# Patient Record
Sex: Female | Born: 1955
Health system: Southern US, Community
[De-identification: ages and names within clinical notes are randomized; demographics above are authoritative.]

## PROBLEM LIST (undated history)

## (undated) DIAGNOSIS — E669 Obesity, unspecified: Secondary | ICD-10-CM

## (undated) DIAGNOSIS — E785 Hyperlipidemia, unspecified: Secondary | ICD-10-CM

## (undated) DIAGNOSIS — T7840XA Allergy, unspecified, initial encounter: Secondary | ICD-10-CM

## (undated) DIAGNOSIS — F419 Anxiety disorder, unspecified: Secondary | ICD-10-CM

## (undated) DIAGNOSIS — S52612A Displaced fracture of left ulna styloid process, initial encounter for closed fracture: Secondary | ICD-10-CM

## (undated) DIAGNOSIS — K529 Noninfective gastroenteritis and colitis, unspecified: Secondary | ICD-10-CM

## (undated) DIAGNOSIS — B001 Herpesviral vesicular dermatitis: Secondary | ICD-10-CM

## (undated) DIAGNOSIS — J302 Other seasonal allergic rhinitis: Secondary | ICD-10-CM

## (undated) DIAGNOSIS — F32A Depression, unspecified: Secondary | ICD-10-CM

## (undated) DIAGNOSIS — R55 Syncope and collapse: Secondary | ICD-10-CM

## (undated) DIAGNOSIS — M1990 Unspecified osteoarthritis, unspecified site: Secondary | ICD-10-CM

## (undated) DIAGNOSIS — G8929 Other chronic pain: Secondary | ICD-10-CM

## (undated) DIAGNOSIS — K219 Gastro-esophageal reflux disease without esophagitis: Secondary | ICD-10-CM

## (undated) DIAGNOSIS — M8580 Other specified disorders of bone density and structure, unspecified site: Secondary | ICD-10-CM

## (undated) DIAGNOSIS — F329 Major depressive disorder, single episode, unspecified: Secondary | ICD-10-CM

## (undated) DIAGNOSIS — K56699 Other intestinal obstruction unspecified as to partial versus complete obstruction: Secondary | ICD-10-CM

## (undated) DIAGNOSIS — K551 Chronic vascular disorders of intestine: Secondary | ICD-10-CM

## (undated) HISTORY — DX: Depression, unspecified: F32.A

## (undated) HISTORY — DX: Allergy, unspecified, initial encounter: T78.40XA

## (undated) HISTORY — DX: Other intestinal obstruction unspecified as to partial versus complete obstruction: K56.699

## (undated) HISTORY — DX: Hyperlipidemia, unspecified: E78.5

## (undated) HISTORY — DX: Displaced fracture of left ulna styloid process, initial encounter for closed fracture: S52.612A

## (undated) HISTORY — DX: Other specified disorders of bone density and structure, unspecified site: M85.80

## (undated) HISTORY — DX: Unspecified osteoarthritis, unspecified site: M19.90

## (undated) HISTORY — DX: Major depressive disorder, single episode, unspecified: F32.9

## (undated) HISTORY — DX: Other seasonal allergic rhinitis: J30.2

## (undated) HISTORY — DX: Gastro-esophageal reflux disease without esophagitis: K21.9

## (undated) HISTORY — DX: Other chronic pain: G89.29

## (undated) HISTORY — DX: Herpesviral vesicular dermatitis: B00.1

## (undated) HISTORY — PX: COLON SURGERY: SHX602

## (undated) HISTORY — PX: TONSILLECTOMY: SUR1361

## (undated) HISTORY — DX: Anxiety disorder, unspecified: F41.9

## (undated) HISTORY — PX: JOINT REPLACEMENT: SHX530

## (undated) HISTORY — DX: Syncope and collapse: R55

## (undated) HISTORY — DX: Chronic vascular disorders of intestine: K55.1

## (undated) HISTORY — DX: Obesity, unspecified: E66.9

## (undated) HISTORY — PX: FRACTURE SURGERY: SHX138

---

## 1985-03-10 HISTORY — PX: DILATION AND CURETTAGE, DIAGNOSTIC / THERAPEUTIC: SUR384

## 2008-02-19 ENCOUNTER — Emergency Department (HOSPITAL_BASED_OUTPATIENT_CLINIC_OR_DEPARTMENT_OTHER): Admission: EM | Admit: 2008-02-19 | Discharge: 2008-02-19 | Payer: Self-pay | Admitting: Emergency Medicine

## 2009-09-11 ENCOUNTER — Observation Stay (HOSPITAL_COMMUNITY): Admission: EM | Admit: 2009-09-11 | Discharge: 2009-09-12 | Payer: Self-pay | Admitting: Internal Medicine

## 2009-09-11 ENCOUNTER — Encounter: Payer: Self-pay | Admitting: Emergency Medicine

## 2009-09-11 ENCOUNTER — Ambulatory Visit: Payer: Self-pay | Admitting: Diagnostic Radiology

## 2010-01-08 LAB — HM MAMMOGRAPHY

## 2010-05-26 LAB — BASIC METABOLIC PANEL
BUN: 20 mg/dL (ref 6–23)
BUN: 9 mg/dL (ref 6–23)
CO2: 27 mEq/L (ref 19–32)
Calcium: 8.5 mg/dL (ref 8.4–10.5)
Chloride: 102 mEq/L (ref 96–112)
Creatinine, Ser: 0.65 mg/dL (ref 0.4–1.2)
Creatinine, Ser: 0.9 mg/dL (ref 0.4–1.2)
GFR calc Af Amer: >60 mL/min (ref >60)
Glucose, Bld: 121 mg/dL — ABNORMAL HIGH (ref 70–99)
Sodium: 143 mEq/L (ref 135–145)

## 2010-05-26 LAB — CBC
HCT: 36.3 % (ref 36.0–46.0)
Hemoglobin: 12.6 g/dL (ref 12.0–15.0)
MCH: 30 pg (ref 26.0–34.0)
MCH: 30.2 pg (ref 26.0–34.0)
MCHC: 34.7 g/dL (ref 30.0–36.0)
MCV: 86.5 fL (ref 78.0–100.0)
MCV: 87.1 fL (ref 78.0–100.0)
Platelets: 155 10*3/uL (ref 150–400)
Platelets: 170 10*3/uL (ref 150–400)
RBC: 3.97 MIL/uL (ref 3.87–5.11)
RDW: 13 % (ref 11.5–15.5)
RDW: 13.7 % (ref 11.5–15.5)

## 2010-05-26 LAB — URINALYSIS, ROUTINE W REFLEX MICROSCOPIC
Bilirubin Urine: NEGATIVE
Ketones, ur: NEGATIVE mg/dL
Nitrite: NEGATIVE
Protein, ur: NEGATIVE mg/dL
Specific Gravity, Urine: 1.019 (ref 1.005–1.030)
Urobilinogen, UA: 1 mg/dL (ref 0.0–1.0)
pH: 5 (ref 5.0–8.0)

## 2010-05-26 LAB — DIFFERENTIAL
Basophils Relative: 0 % (ref 0–1)
Eosinophils Absolute: 0 10*3/uL (ref 0.0–0.7)
Lymphocytes Relative: 18 % (ref 12–46)
Lymphs Abs: 1.1 10*3/uL (ref 0.7–4.0)
Monocytes Absolute: 0.5 10*3/uL (ref 0.1–1.0)

## 2010-05-26 LAB — CARDIAC PANEL(CRET KIN+CKTOT+MB+TROPI)
CK, MB: 1.4 ng/mL (ref 0.3–4.0)
Total CK: 81 U/L (ref 7–177)

## 2010-05-26 LAB — HEMOGLOBIN A1C
Hgb A1c MFr Bld: 5.7 % — ABNORMAL HIGH (ref <5.7)
Mean Plasma Glucose: 117 mg/dL — ABNORMAL HIGH (ref <117)

## 2010-05-26 LAB — LACTIC ACID, PLASMA: Lactic Acid, Venous: 1.9 mmol/L (ref 0.5–2.2)

## 2010-09-23 ENCOUNTER — Encounter: Payer: Self-pay | Admitting: Family Medicine

## 2010-09-23 DIAGNOSIS — M1990 Unspecified osteoarthritis, unspecified site: Secondary | ICD-10-CM | POA: Insufficient documentation

## 2010-09-23 DIAGNOSIS — F32A Depression, unspecified: Secondary | ICD-10-CM | POA: Insufficient documentation

## 2010-09-23 DIAGNOSIS — E785 Hyperlipidemia, unspecified: Secondary | ICD-10-CM | POA: Insufficient documentation

## 2010-09-23 DIAGNOSIS — F329 Major depressive disorder, single episode, unspecified: Secondary | ICD-10-CM | POA: Insufficient documentation

## 2011-01-04 ENCOUNTER — Emergency Department (HOSPITAL_COMMUNITY): Payer: 59

## 2011-01-04 ENCOUNTER — Emergency Department (HOSPITAL_COMMUNITY)
Admission: EM | Admit: 2011-01-04 | Discharge: 2011-01-04 | Disposition: A | Discharge status: Home / Self Care | Payer: 59 | Attending: Emergency Medicine | Admitting: Emergency Medicine

## 2011-01-04 DIAGNOSIS — R1013 Epigastric pain: Secondary | ICD-10-CM | POA: Insufficient documentation

## 2011-01-04 DIAGNOSIS — R1032 Left lower quadrant pain: Secondary | ICD-10-CM | POA: Insufficient documentation

## 2011-01-04 DIAGNOSIS — M129 Arthropathy, unspecified: Secondary | ICD-10-CM | POA: Insufficient documentation

## 2011-01-04 DIAGNOSIS — E7800 Pure hypercholesterolemia, unspecified: Secondary | ICD-10-CM | POA: Insufficient documentation

## 2011-01-04 DIAGNOSIS — Z79899 Other long term (current) drug therapy: Secondary | ICD-10-CM | POA: Insufficient documentation

## 2011-01-04 LAB — COMPREHENSIVE METABOLIC PANEL
CO2: 28 mEq/L (ref 19–32)
Calcium: 10.1 mg/dL (ref 8.4–10.5)
Creatinine, Ser: 0.86 mg/dL (ref 0.50–1.10)
GFR calc Af Amer: 85 mL/min — ABNORMAL LOW (ref >90)
GFR calc non Af Amer: 74 mL/min — ABNORMAL LOW (ref >90)
Glucose, Bld: 155 mg/dL — ABNORMAL HIGH (ref 70–99)
Total Protein: 6.4 g/dL (ref 6.0–8.3)

## 2011-01-04 LAB — URINALYSIS, ROUTINE W REFLEX MICROSCOPIC
Glucose, UA: NEGATIVE mg/dL
Hgb urine dipstick: NEGATIVE
Protein, ur: NEGATIVE mg/dL
Urobilinogen, UA: 0.2 mg/dL (ref 0.0–1.0)

## 2011-01-04 LAB — DIFFERENTIAL
Basophils Absolute: 0 10*3/uL (ref 0.0–0.1)
Basophils Relative: 0 % (ref 0–1)
Eosinophils Relative: 0 % (ref 0–5)
Monocytes Absolute: 0.5 10*3/uL (ref 0.1–1.0)
Monocytes Relative: 8 % (ref 3–12)
Neutro Abs: 4.4 10*3/uL (ref 1.7–7.7)

## 2011-01-04 LAB — CBC
HCT: 35.4 % — ABNORMAL LOW (ref 36.0–46.0)
Hemoglobin: 11.9 g/dL — ABNORMAL LOW (ref 12.0–15.0)
MCH: 29.4 pg (ref 26.0–34.0)
MCHC: 33.6 g/dL (ref 30.0–36.0)
RDW: 13.1 % (ref 11.5–15.5)

## 2011-01-04 LAB — POCT I-STAT, CHEM 8
BUN: 5 mg/dL — ABNORMAL LOW (ref 6–23)
Creatinine, Ser: 0.8 mg/dL (ref 0.50–1.10)
Glucose, Bld: 127 mg/dL — ABNORMAL HIGH (ref 70–99)
Hemoglobin: 12.6 g/dL (ref 12.0–15.0)
TCO2: 22 mmol/L (ref 0–100)

## 2011-01-04 LAB — LIPASE, BLOOD: Lipase: 40 U/L (ref 11–59)

## 2011-01-04 MED ORDER — IOHEXOL 300 MG/ML  SOLN
100.0000 mL | Freq: Once | INTRAMUSCULAR | Status: AC | PRN
  Administered 2011-01-04: 100 mL via INTRAVENOUS

## 2011-11-18 ENCOUNTER — Ambulatory Visit (INDEPENDENT_AMBULATORY_CARE_PROVIDER_SITE_OTHER): Payer: 59 | Admitting: Family Medicine

## 2011-11-18 ENCOUNTER — Encounter: Payer: Self-pay | Admitting: Family Medicine

## 2011-11-18 ENCOUNTER — Ambulatory Visit (HOSPITAL_BASED_OUTPATIENT_CLINIC_OR_DEPARTMENT_OTHER)
Admission: RE | Admit: 2011-11-18 | Discharge: 2011-11-18 | Disposition: A | Discharge status: Home / Self Care | Payer: 59 | Source: Ambulatory Visit | Attending: Family Medicine | Admitting: Family Medicine

## 2011-11-18 VITALS — BP 109/75 | HR 80 | Ht 70.0 in | Wt 209.0 lb

## 2011-11-18 DIAGNOSIS — M25569 Pain in unspecified knee: Secondary | ICD-10-CM | POA: Insufficient documentation

## 2011-11-18 DIAGNOSIS — M1710 Unilateral primary osteoarthritis, unspecified knee: Secondary | ICD-10-CM | POA: Insufficient documentation

## 2011-11-18 DIAGNOSIS — M25561 Pain in right knee: Secondary | ICD-10-CM

## 2011-11-18 NOTE — Patient Instructions (Addendum)
Take tylenol 500mg  1-2 tabs three times a day for pain. Aleve 1-2 tabs twice a day with food Glucosamine sulfate 750mg  twice a day is a supplement that may help moderate to severe arthritis. Capsaicin topically up to four times a day may also help with pain. Cortisone injections are an option - you were given one of these today. If cortisone injections do not help, there are different types of shots that may help but they take longer to take effect. It's important that you continue to stay active. Consider physical therapy to strengthen muscles around the joint that hurts to take pressure off of the joint itself. Straight leg raises, leg raises with outturned foot, leg extension, hip side raises - 3 sets of 10 once a day. Shoe inserts with good arch support may be helpful. Walker or cane if needed. Heat or ice 15 minutes at a time 3-4 times a day as needed to help with pain. Water aerobics and cycling with low resistance are the best two types of exercise for arthritis. A knee brace may be helpful if your knee feels unstable due to pain. Follow up with me in 1 month or as needed.

## 2011-11-23 ENCOUNTER — Encounter: Payer: Self-pay | Admitting: Family Medicine

## 2011-11-23 DIAGNOSIS — M25561 Pain in right knee: Secondary | ICD-10-CM | POA: Insufficient documentation

## 2011-11-23 NOTE — Assessment & Plan Note (Signed)
2/2 moderate medial DJD.  Discussed conservative care (tylenol, mobic (instead of aleve as she takes mobic regularly), glucosamine, capsaicin.  She would like to try cortisone injection.  Dicussed viscosupplementation, formal PT.  Quad strengthening demonstrated.  F/u in 1 month or prn.  After informed written consent, patient was seated on exam table. Right knee was prepped with alcohol swab and utilizing anterolateral approach, patient's right knee was injected intraarticularly with 3:1 marcaine: depomedrol. Patient tolerated the procedure well without immediate complications.

## 2011-11-23 NOTE — Progress Notes (Signed)
Subjective:    [name], female    DOB: 04/09/1955, 56 y.o.   MRN: [id number]  PCP: Dr. Tanya Nones  HPI 56 yo F here for right knee pain.  Patient reports about 10 years ago she had lateral aspect of right knee taken out by her dog. This led to swelling, crutches for a week and felt completely better after 1-2 weeks. Had some pain 5-6 years ago - did physical therapy which helped. Has lost over 100 pounds past year and more active. Pain has become more persistent past 1-2 years. More medial. No catching, locking though does feel like it's going to give out on occasion. Takes mobic and ices after exercise. Some swelling but no bruising.  Past Medical History  Diagnosis Date  . Seasonal allergies   . Osteoarthritis   . Depression   . Hyperlipidemia     Current Outpatient Prescriptions on File Prior to Visit  Medication Sig Dispense Refill  . atorvastatin (LIPITOR) 40 MG tablet Take 40 mg by mouth daily.        . calcium-vitamin D (OSCAL WITH D) 500-200 MG-UNIT per tablet Take 1 tablet by mouth daily.        . Cholecalciferol (VITAMIN D) 1000 UNITS capsule Take 1,000 Units by mouth daily.        . citalopram (CELEXA) 20 MG tablet Take 20 mg by mouth daily.        Stephanie Acre (GLUCOSAMINE MSM COMPLEX) TABS Take by mouth.        . loratadine (CLARITIN) 10 MG tablet Take 10 mg by mouth daily.        . meloxicam (MOBIC) 7.5 MG tablet Take 7.5 mg by mouth daily.        . Multiple Vitamins-Minerals (MULTIVITAMIN,TX-MINERALS) tablet Take 1 tablet by mouth daily.        . vitamin E 400 UNIT capsule Take 400 Units by mouth daily.          Past Surgical History  Procedure Date  . Tonsillectomy     Allergies  Allergen Reactions  . Penicillins Rash  . Sulfa Antibiotics Rash    History   Social History  . Marital Status: Divorced    Spouse Name: N/A    Number of Children: N/A  . Years of Education: N/A   Occupational History  . Not on file.     Social History Main Topics  . Smoking status: Never Smoker   . Smokeless tobacco: Not on file  . Alcohol Use: Not on file  . Drug Use: Not on file  . Sexually Active: Not on file   Other Topics Concern  . Not on file   Social History Narrative  . No narrative on file    Family History  Problem Relation Age of Onset  . Sudden death Neg Hx   . Hypertension Neg Hx   . Hyperlipidemia Neg Hx   . Heart attack Neg Hx   . Diabetes Neg Hx     BP 109/75  Pulse 80  Ht 5\' 10"  (1.778 m)  Wt 209 lb (94.802 kg)  BMI 29.99 kg/m2  Review of Systems See HPI above.    Objective:   Physical Exam Gen: NAD  R knee: No gross deformity, ecchymoses, swelling.  1+ crepitation. TTP medial joint line.  No lateral joint line or post patellar facet TTP. FROM. Negative ant/post drawers. Negative valgus/varus testing. Negative lachmanns. Negative mcmurrays, apleys, patellar apprehension, clarkes. NV intact distally.  L  knee: FROM without pain or instability.    Assessment & Plan:  1. RIght knee pain - 2/2 moderate medial DJD.  Discussed conservative care (tylenol, mobic (instead of aleve as she takes mobic regularly), glucosamine, capsaicin.  She would like to try cortisone injection.  Dicussed viscosupplementation, formal PT.  Quad strengthening demonstrated.  F/u in 1 month or prn.  After informed written consent, patient was seated on exam table. Right knee was prepped with alcohol swab and utilizing anterolateral approach, patient's right knee was injected intraarticularly with 3:1 marcaine: depomedrol. Patient tolerated the procedure well without immediate complications.

## 2011-12-17 ENCOUNTER — Ambulatory Visit: Payer: 59 | Admitting: Family Medicine

## 2012-03-29 ENCOUNTER — Other Ambulatory Visit: Payer: Self-pay | Admitting: Physician Assistant

## 2012-03-29 ENCOUNTER — Ambulatory Visit
Admission: RE | Admit: 2012-03-29 | Discharge: 2012-03-29 | Disposition: A | Discharge status: Home / Self Care | Payer: 59 | Source: Ambulatory Visit | Attending: Physician Assistant | Admitting: Physician Assistant

## 2012-03-29 DIAGNOSIS — M79671 Pain in right foot: Secondary | ICD-10-CM

## 2012-03-29 DIAGNOSIS — S9030XA Contusion of unspecified foot, initial encounter: Secondary | ICD-10-CM

## 2012-09-07 DIAGNOSIS — K529 Noninfective gastroenteritis and colitis, unspecified: Secondary | ICD-10-CM

## 2012-09-07 HISTORY — DX: Noninfective gastroenteritis and colitis, unspecified: K52.9

## 2012-11-11 ENCOUNTER — Other Ambulatory Visit: Payer: Self-pay | Admitting: Family Medicine

## 2012-11-11 ENCOUNTER — Ambulatory Visit: Payer: 59 | Admitting: *Deleted

## 2012-11-11 DIAGNOSIS — E785 Hyperlipidemia, unspecified: Secondary | ICD-10-CM

## 2012-11-11 DIAGNOSIS — Z23 Encounter for immunization: Secondary | ICD-10-CM

## 2012-11-11 DIAGNOSIS — Z79899 Other long term (current) drug therapy: Secondary | ICD-10-CM

## 2012-11-11 DIAGNOSIS — E559 Vitamin D deficiency, unspecified: Secondary | ICD-10-CM

## 2012-11-12 ENCOUNTER — Other Ambulatory Visit: Payer: 59

## 2012-11-12 LAB — COMPLETE METABOLIC PANEL WITH GFR
ALT: 11 U/L (ref 0–35)
AST: 15 U/L (ref 0–37)
Alkaline Phosphatase: 37 U/L — ABNORMAL LOW (ref 39–117)
BUN: 17 mg/dL (ref 6–23)
CO2: 31 mEq/L (ref 19–32)
Calcium: 9.2 mg/dL (ref 8.4–10.5)
Glucose, Bld: 76 mg/dL (ref 70–99)
Sodium: 139 mEq/L (ref 135–145)
Total Bilirubin: 0.6 mg/dL (ref 0.3–1.2)

## 2012-11-12 LAB — CBC WITH DIFFERENTIAL/PLATELET
Eosinophils Absolute: 0 10*3/uL (ref 0.0–0.7)
Hemoglobin: 12.6 g/dL (ref 12.0–15.0)
Lymphocytes Relative: 32 % (ref 12–46)
Lymphs Abs: 1.2 10*3/uL (ref 0.7–4.0)
Neutro Abs: 2 10*3/uL (ref 1.7–7.7)
Neutrophils Relative %: 54 % (ref 43–77)
Platelets: 172 10*3/uL (ref 150–400)
RBC: 4.22 MIL/uL (ref 3.87–5.11)
WBC: 3.7 10*3/uL — ABNORMAL LOW (ref 4.0–10.5)

## 2012-11-12 LAB — LIPID PANEL
Cholesterol: 168 mg/dL (ref 0–200)
HDL: 58 mg/dL (ref >39)
Total CHOL/HDL Ratio: 2.9 Ratio
Triglycerides: 78 mg/dL (ref <150)

## 2012-11-12 NOTE — Addendum Note (Signed)
Addended by: WRAY, [place] on: 11/12/2012 08:05 AM   Modules accepted: Orders

## 2012-11-22 ENCOUNTER — Ambulatory Visit (INDEPENDENT_AMBULATORY_CARE_PROVIDER_SITE_OTHER): Payer: 59 | Admitting: Physician Assistant

## 2012-11-22 ENCOUNTER — Encounter: Payer: Self-pay | Admitting: Physician Assistant

## 2012-11-22 VITALS — BP 108/60 | HR 78 | Temp 98.8°F | Resp 18 | Ht 69.0 in | Wt 216.0 lb

## 2012-11-22 DIAGNOSIS — B001 Herpesviral vesicular dermatitis: Secondary | ICD-10-CM | POA: Insufficient documentation

## 2012-11-22 DIAGNOSIS — F32A Depression, unspecified: Secondary | ICD-10-CM

## 2012-11-22 DIAGNOSIS — F329 Major depressive disorder, single episode, unspecified: Secondary | ICD-10-CM

## 2012-11-22 DIAGNOSIS — B009 Herpesviral infection, unspecified: Secondary | ICD-10-CM

## 2012-11-22 DIAGNOSIS — M25569 Pain in unspecified knee: Secondary | ICD-10-CM

## 2012-11-22 DIAGNOSIS — M1990 Unspecified osteoarthritis, unspecified site: Secondary | ICD-10-CM

## 2012-11-22 DIAGNOSIS — M25561 Pain in right knee: Secondary | ICD-10-CM

## 2012-11-22 DIAGNOSIS — E785 Hyperlipidemia, unspecified: Secondary | ICD-10-CM

## 2012-11-22 MED ORDER — VALACYCLOVIR HCL 1 G PO TABS: ORAL_TABLET | ORAL | Status: DC

## 2012-11-22 MED ORDER — ACYCLOVIR 5 % EX CREA: 1.0000 "application " | TOPICAL_CREAM | CUTANEOUS | Status: DC

## 2012-11-22 NOTE — Progress Notes (Signed)
Patient ID: Henry Demeritt MRN: [id number], DOB: [birth year], 57 y.o. Date of Encounter: @DATE @  Chief Complaint:  Chief Complaint  Patient presents with  . routine follow up    HPI: 57 y.o. year old female  presents for routine followup office visit.  She states that her depression is well controlled on her current dose of Celexa. She feels that her mood is stable on meds. She is having no adverse effects with the medication.  She does take Mobic as needed for her arthritic pain. This controls her symptoms.  Today she does have a complaint of recurrent cold sores. She gets these on her upper lip area and even sometimes up into the edge of her nose. She has used Zovirax cream in the past but has run out. As well she is concerned that she keeps having recurrent flares.   Past Medical History  Diagnosis Date  . Seasonal allergies   . Osteoarthritis   . Depression   . Hyperlipidemia   . Recurrent cold sores      Home Meds: See attached medication section for current medication list. Any medications entered into computer today will not appear on this note's list. The medications listed below were entered prior to today. Current Outpatient Prescriptions on File Prior to Visit  Medication Sig Dispense Refill  . calcium-vitamin D (OSCAL WITH D) 500-200 MG-UNIT per tablet Take 1 tablet by mouth daily.        . Cholecalciferol (VITAMIN D) 1000 UNITS capsule Take 1,000 Units by mouth daily.        . citalopram (CELEXA) 20 MG tablet Take 20 mg by mouth daily.        Stephanie Acre (GLUCOSAMINE MSM COMPLEX) TABS Take by mouth.        . loratadine (CLARITIN) 10 MG tablet Take 10 mg by mouth daily.        . meloxicam (MOBIC) 7.5 MG tablet Take 7.5 mg by mouth daily.        . Multiple Vitamins-Minerals (MULTIVITAMIN,TX-MINERALS) tablet Take 1 tablet by mouth daily.        . vitamin E 400 UNIT capsule Take 400 Units by mouth daily.         No current facility-administered  medications on file prior to visit.    Allergies:  Allergies  Allergen Reactions  . Penicillins Rash  . Sulfa Antibiotics Rash    History   Social History  . Marital Status: Divorced    Spouse Name: N/A    Number of Children: N/A  . Years of Education: N/A   Occupational History  . Not on file.   Social History Main Topics  . Smoking status: Never Smoker   . Smokeless tobacco: Not on file  . Alcohol Use: No  . Drug Use: No  . Sexual Activity: Not on file   Other Topics Concern  . Not on file   Social History Narrative  . No narrative on file    Family History  Problem Relation Age of Onset  . Sudden death Neg Hx   . Hypertension Neg Hx   . Hyperlipidemia Neg Hx   . Heart attack Neg Hx   . Diabetes Neg Hx   . Stroke Mother   . Arthritis Mother   . Cancer Father   . Arthritis Sister   . Cancer Maternal Grandmother   . Stroke Maternal Grandfather   . Cancer Maternal Grandfather   . Cancer Paternal Grandmother   . Heart disease Paternal  Grandmother   . Stroke Paternal Grandfather   . Arthritis Sister      Review of Systems:  See HPI for pertinent ROS. All other ROS negative.    Physical Exam: Blood pressure 108/60, pulse 78, temperature 98.8 F (37.1 C), temperature source Oral, resp. rate 18, height 5\' 9"  (1.753 m), weight 216 lb (97.977 kg)., Body mass index is 31.88 kg/(m^2). General: WNWD WF Appears in no acute distress. Neck: Supple. No thyromegaly. No lymphadenopathy. Lungs: Clear bilaterally to auscultation without wheezes, rales, or rhonchi. Breathing is unlabored. Heart: RRR with S1 S2. No murmurs, rubs, or gallops. Musculoskeletal:  Strength and tone normal for age. Extremities/Skin: Warm and dry.  No edema.  Neuro: Alert and oriented X 3. Moves all extremities spontaneously. Gait is normal. CNII-XII grossly in tact. Psych:  Responds to questions appropriately with a normal affect.   Results for orders placed in visit on 11/11/12  CBC WITH  DIFFERENTIAL      Result Value Range   WBC 3.7 (*) 4.0 - 10.5 K/uL   RBC 4.22  3.87 - 5.11 MIL/uL   Hemoglobin 12.6  12.0 - 15.0 g/dL   HCT 16.1  09.6 - 04.5 %   MCV 86.7  78.0 - 100.0 fL   MCH 29.9  26.0 - 34.0 pg   MCHC 34.4  30.0 - 36.0 g/dL   RDW 40.9  81.1 - 91.4 %   Platelets 172  150 - 400 K/uL   Neutrophils Relative % 54  43 - 77 %   Neutro Abs 2.0  1.7 - 7.7 K/uL   Lymphocytes Relative 32  12 - 46 %   Lymphs Abs 1.2  0.7 - 4.0 K/uL   Monocytes Relative 13 (*) 3 - 12 %   Monocytes Absolute 0.5  0.1 - 1.0 K/uL   Eosinophils Relative 0  0 - 5 %   Eosinophils Absolute 0.0  0.0 - 0.7 K/uL   Basophils Relative 1  0 - 1 %   Basophils Absolute 0.0  0.0 - 0.1 K/uL   Smear Review Criteria for review not met    LIPID PANEL      Result Value Range   Cholesterol 168  0 - 200 mg/dL   Triglycerides 78  <782 mg/dL   HDL 58  >95 mg/dL   Total CHOL/HDL Ratio 2.9     VLDL 16  0 - 40 mg/dL   LDL Cholesterol 94  0 - 99 mg/dL  VITAMIN D 25 HYDROXY      Result Value Range   Vit D, 25-Hydroxy 63  30 - 89 ng/mL  COMPLETE METABOLIC PANEL WITH GFR      Result Value Range   Sodium 139  135 - 145 mEq/L   Potassium 4.2  3.5 - 5.3 mEq/L   Chloride 105  96 - 112 mEq/L   CO2 31  19 - 32 mEq/L   Glucose, Bld 76  70 - 99 mg/dL   BUN 17  6 - 23 mg/dL   Creat 6.21  3.08 - 6.57 mg/dL   Total Bilirubin 0.6  0.3 - 1.2 mg/dL   Alkaline Phosphatase 37 (*) 39 - 117 U/L   AST 15  0 - 37 U/L   ALT 11  0 - 35 U/L   Total Protein 5.9 (*) 6.0 - 8.3 g/dL   Albumin 3.9  3.5 - 5.2 g/dL   Calcium 9.2  8.4 - 84.6 mg/dL   GFR, Est African American 80  GFR, Est Non African American 22       ASSESSMENT AND PLAN:  57 y.o. year old female with  1. Depression Controlled with current medication.  2. Hyperlipidemia We have been able to wean down the dose over time. She is now completely off of cholesterol medicine with excellent lipid profile as above!!  3. Recurrent cold sores - valACYclovir (VALTREX)  1000 MG tablet; 2 every 12 hours for 2 doses as needed for flare  Dispense: 20 tablet; Refill: 0 - acyclovir cream (ZOVIRAX) 5 %; Apply 1 application topically every 3 (three) hours.  Dispense: 15 g; Refill: 3  4. Osteoarthritis 5. Right knee pain Continue meloxicam when necessary with food.   [address] [place], [place], [facility] 11/22/2012 4:44 PM

## 2012-11-29 ENCOUNTER — Encounter: Payer: Self-pay | Admitting: Physician Assistant

## 2012-11-29 ENCOUNTER — Ambulatory Visit (INDEPENDENT_AMBULATORY_CARE_PROVIDER_SITE_OTHER): Payer: 59 | Admitting: Physician Assistant

## 2012-11-29 VITALS — BP 122/78 | HR 80 | Temp 98.1°F | Resp 20

## 2012-11-29 DIAGNOSIS — R569 Unspecified convulsions: Secondary | ICD-10-CM

## 2012-11-29 NOTE — Progress Notes (Signed)
[name] MRN: [id number], DOB: October 24, 1955, 57 y.o. Date of Encounter: @DATE @  Chief Complaint:  Chief Complaint  Patient presents with  . follow up seizure    HPI: 57 y.o. year old white female  presents for f/u of recent ER visit secondary to new diagnosis of Seizure.   She lives in [facility] [place]. On Wednesday 11/24/12 she flew to [place] to visit her family-her daughter, son-in-law, and grandson. She says that while she was there they were very busy doing  activities with the grandson. However she states there was no stress or tension. As well she says that she slept well and had no problems with insomnia and got plenty of sleep. She then was returning home on Saturday 11/27/12. She had a flight from [place] to [place], where she had a layover, then was to have a flight to [place]. During the flight from [place] to [place], the plane had just landed in [place]. She says that she began to feel hot and woozy and remembers reaching to turn the air on her and she leaned her head back. She says at that time the plane was pulling into the gate and nobody had started to get off the plane. The next thing she remembers is the lady next to her "waking her up." The lady sitting next to her said that she was thrashing her arms around. The patient states the last thing she remembers prior to the episode was the plane was pulling into the gate. She says that no one had started to get off of the plane. However when she "came to" after the episode, half of the people had already gotten off the plane. Her neighbor did not give her any other details. Neighbor just commented that her arms were thrashing around. However the patient does state that she had urinated all over herself. She says that she even tried to go to the bathroom but says that urine continued to come out and she could not control it. Says that she felt very tired after this. She was able to exit the plane  without much assistance. The muscles in her neck felt very sore afterwards. She has not noticed any other muscles feeling sore. There is no sign that she has bitten her tongue. She had no bleeding and has had no pain of the tongue etc. She was then transported to the emergency room at [facility] system in [place]. She reported that she had eaten nothing out of the ordinary. She had no alcohol. In general she drinks very little alcohol. She denied any illegal substances and no cigarette smoking. Denied any recent headache, vision changes, fevers, chills, nausea, and vomiting.  Full neurologic exam was performed it was completely normal.  CT of the head was performed. Normal.  There were no significant lab abnormalities Hemoglobin 11.6, hematocrit 34, Sodium 142, potassium 3.3, chloride 107, CO2 21, glucose 102, BUN 20, creatinine 1.0.  It was felt the patient was clinically stable with no neurologic deficits. Plan is to be discharged home with close followup with her primary care provider in [facility] for further evaluation of her first seizure. Primary care can then arrange neurology evaluation.  She reports that since then, she just felt tired the following day but otherwise has felt back to her usual health. Has had no neurologic changes or deficits. No weakness. No further seizure activity.  She has no personal history of prior seizure. As well there is no  family history of seizure.  She does note that she has history of 2 episodes of what had been felt to be vasovagal syncope when on the toilet having BM in the past. Both of these episodes were unwitnessed. However there was no indication of these episodes being seizures. There was no urinary incontinence and no tongue biting with either of those episodes. Evaluation at the time of those episodes showed significant stool in the colon and was felt to be secondary to constipation and vasovagal syncope.   Past Medical History   Diagnosis Date  . Seasonal allergies   . Osteoarthritis   . Depression   . Hyperlipidemia   . Recurrent cold sores      Home Meds: See attached medication section for current medication list. Any medications entered into computer today will not appear on this note's list. The medications listed below were entered prior to today. Current Outpatient Prescriptions on File Prior to Visit  Medication Sig Dispense Refill  . acyclovir cream (ZOVIRAX) 5 % Apply 1 application topically every 3 (three) hours.  15 g  3  . calcium-vitamin D (OSCAL WITH D) 500-200 MG-UNIT per tablet Take 1 tablet by mouth daily.        . Cholecalciferol (VITAMIN D) 1000 UNITS capsule Take 1,000 Units by mouth daily.        . citalopram (CELEXA) 20 MG tablet Take 20 mg by mouth daily.        Stephanie Acre (GLUCOSAMINE MSM COMPLEX) TABS Take by mouth.        . loratadine (CLARITIN) 10 MG tablet Take 10 mg by mouth daily.        Marland Kitchen Lysine 500 MG TABS Take by mouth daily.      . meloxicam (MOBIC) 7.5 MG tablet Take 7.5 mg by mouth daily.        . Multiple Vitamins-Minerals (MULTIVITAMIN,TX-MINERALS) tablet Take 1 tablet by mouth daily.        . valACYclovir (VALTREX) 1000 MG tablet 2 every 12 hours for 2 doses as needed for flare  20 tablet  0  . vitamin E 400 UNIT capsule Take 400 Units by mouth daily.         No current facility-administered medications on file prior to visit.    Allergies:  Allergies  Allergen Reactions  . Penicillins Rash  . Sulfa Antibiotics Rash    History   Social History  . Marital Status: Divorced    Spouse Name: N/A    Number of Children: N/A  . Years of Education: N/A   Occupational History  . Not on file.   Social History Main Topics  . Smoking status: Never Smoker   . Smokeless tobacco: Not on file  . Alcohol Use: No  . Drug Use: No  . Sexual Activity: Not on file   Other Topics Concern  . Not on file   Social History Narrative  . No narrative on  file    Family History  Problem Relation Age of Onset  . Sudden death Neg Hx   . Hypertension Neg Hx   . Hyperlipidemia Neg Hx   . Heart attack Neg Hx   . Diabetes Neg Hx   . Stroke Mother   . Arthritis Mother   . Cancer Father   . Arthritis Sister   . Cancer Maternal Grandmother   . Stroke Maternal Grandfather   . Cancer Maternal Grandfather   . Cancer Paternal Grandmother   . Heart disease Paternal Grandmother   .  Stroke Paternal Grandfather   . Arthritis Sister      Review of Systems:  See HPI for pertinent ROS. All other ROS negative.    Physical Exam: Blood pressure 122/78, pulse 80, temperature 98.1 F (36.7 C), temperature source Oral, resp. rate 20., There is no weight on file to calculate BMI. General: Appears in no acute distress. Neck: Supple. No thyromegaly. No lymphadenopathy. No carotid bruits. Lungs: Clear bilaterally to auscultation without wheezes, rales, or rhonchi. Breathing is unlabored. Heart: RRR with S1 S2. No murmurs, rubs, or gallops. Musculoskeletal:  Strength and tone normal for age. Extremities/Skin: Warm and dry.  No edema.  Neuro: Alert and oriented X 3. Moves all extremities spontaneously. Gait is normal. CNII-XII grossly in tact. Coordination normal. Speech normal. Strength is 5/5 in bilateral upper extremities, bilateral grip, and bilateral lower extremities. Psych:  Responds to questions appropriately with a normal affect.     ASSESSMENT AND PLAN:  57 y.o. year old female with  1. New onset seizure Status post evaluation in the emergency room at [facility] in [place] on 11/27/12 Status post head CT -11/27/12 Electrolytes, H/H essentially normal 11/27/12  Will obtain neurology referral.  Patient informed that she is not to drive for 6 months or until cleared to drive by neurology. Therefore will obtain neurology referral as soon as possible.  - Ambulatory referral to Neurology   Signed, Shon Hale [place], [place],  [facility] 11/29/2012 12:23 PM

## 2012-11-30 ENCOUNTER — Ambulatory Visit (INDEPENDENT_AMBULATORY_CARE_PROVIDER_SITE_OTHER): Payer: 59 | Admitting: Neurology

## 2012-11-30 ENCOUNTER — Encounter: Payer: Self-pay | Admitting: Neurology

## 2012-11-30 VITALS — BP 135/68 | HR 58 | Ht 70.0 in | Wt 222.0 lb

## 2012-11-30 DIAGNOSIS — R55 Syncope and collapse: Secondary | ICD-10-CM | POA: Insufficient documentation

## 2012-11-30 HISTORY — DX: Syncope and collapse: R55

## 2012-11-30 NOTE — Progress Notes (Signed)
Reason for visit: Syncope  Monique Gonzalez is a 57 y.o. female  History of present illness:  Monique Gonzalez is a 57 year old right-handed white female with a history of several syncopal events in the past. The patient has had 4 prior events, with the first event occurring 9 years ago. The patient indicates that all of the events have been associated with a crampy sensation in the abdomen, and this is followed by a lightheaded, hot flushed sensation, diaphoresis, and eventually syncope. The patient will usually fall over, and she will regain consciousness almost as soon as she has hit the ground, and she will feel wiped out after the event. The patient has never had urinary or fecal incontinence with the events, or episodes of tongue biting or jerking. The patient was on an airplane recently, and she once again began to feel some crampy sensations in the abdomen. Shortly afterwards, the patient started feeling hot, diaphoretic, queasy. The patient turned on the air to improve her symptoms. The patient then lossed consciousness. The person sitting next to her in the airplane indicated that she was flailing about when she lost consciousness. The patient did not bite her tongue, but she did have urinary incontinence. The patient went to the hospital, and a CT scan of brain was done, and this was unremarkable. The patient had some neck stiffness and right shoulder discomfort following this event. The patient is sent to this office for an evaluation. The patient denies any chest pains, or palpitations of the heart. The patient denies any focal numbness or weakness of the face, arms, or legs. The patient denies any balance issues. The patient did have a bowel movement following the syncopal event. The patient is sent to this office for further evaluation. There is no family history of seizures.  Past Medical History  Diagnosis Date  . Seasonal allergies   . Osteoarthritis   . Depression   . Hyperlipidemia   .  Recurrent cold sores   . Syncope and collapse 11/30/2012  . Vasovagal syncope     Past Surgical History  Procedure Laterality Date  . Tonsillectomy      Family History  Problem Relation Age of Onset  . Sudden death Neg Hx   . Hypertension Neg Hx   . Hyperlipidemia Neg Hx   . Heart attack Neg Hx   . Diabetes Neg Hx   . Stroke Mother   . Arthritis Mother   . Cancer Father   . Arthritis Sister   . Cancer Maternal Grandmother   . Stroke Maternal Grandfather   . Cancer Maternal Grandfather   . Cancer Paternal Grandmother   . Heart disease Paternal Grandmother   . Stroke Paternal Grandfather   . Arthritis Sister     Social history:  reports that she has never smoked. She has never used smokeless tobacco. She reports that  drinks alcohol. She reports that she does not use illicit drugs.  Medications:  Current Outpatient Prescriptions on File Prior to Visit  Medication Sig Dispense Refill  . acyclovir cream (ZOVIRAX) 5 % Apply 1 application topically every 3 (three) hours.  15 g  3  . calcium-vitamin D (OSCAL WITH D) 500-200 MG-UNIT per tablet Take 1 tablet by mouth daily.       . Cholecalciferol (VITAMIN D) 1000 UNITS capsule Take 1,000 Units by mouth daily.        . citalopram (CELEXA) 20 MG tablet Take 20 mg by mouth daily.        Marland Kitchen  Glucos-MSM-C-Mn-Ginger-Willow (GLUCOSAMINE MSM COMPLEX) TABS Take by mouth.        . loratadine (CLARITIN) 10 MG tablet Take 10 mg by mouth daily.        Marland Kitchen Lysine 500 MG TABS Take by mouth daily.      . meloxicam (MOBIC) 7.5 MG tablet Take 7.5 mg by mouth daily.        . Multiple Vitamins-Minerals (MULTIVITAMIN,TX-MINERALS) tablet Take 1 tablet by mouth daily.        . valACYclovir (VALTREX) 1000 MG tablet 2 every 12 hours for 2 doses as needed for flare  20 tablet  0  . vitamin E 400 UNIT capsule Take 400 Units by mouth daily.         No current facility-administered medications on file prior to visit.      Allergies  Allergen Reactions  .  Penicillins Rash  . Sulfa Antibiotics Rash    ROS:  Out of a complete 14 system review of symptoms, the patient complains only of the following symptoms, and all other reviewed systems are negative.  Weight loss on diet Ringing in the ears Easy bruising Joint pain Allergies Headache, dizziness, seizures, syncope Depression, insomnia  Blood pressure 135/68, pulse 58, height 5\' 10"  (1.778 m), weight 222 lb (100.699 kg).  Physical Exam  General: The patient is alert and cooperative at the time of the examination. The patient is mildly to moderately obese.   Head: Pupils are equal, round, and reactive to light. Discs are flat bilaterally.  Neck: The neck is supple, no carotid bruits are noted.  Respiratory: The respiratory examination is clear.  Cardiovascular: The cardiovascular examination reveals a regular rate and rhythm, no obvious murmurs or rubs are noted.  Skin: Extremities are without significant edema.  Neurologic Exam  Mental status:  Cranial nerves: Facial symmetry is present. There is good sensation of the face to pinprick and soft touch bilaterally. The strength of the facial muscles and the muscles to head turning and shoulder shrug are normal bilaterally. Speech is well enunciated, no aphasia or dysarthria is noted. Extraocular movements are full. Visual fields are full.  Motor: The motor testing reveals 5 over 5 strength of all 4 extremities. Good symmetric motor tone is noted throughout.  Sensory: Sensory testing is intact to pinprick, soft touch, vibration sensation, and position sense on all 4 extremities. No evidence of extinction is noted.  Coordination: Cerebellar testing reveals good finger-nose-finger and heel-to-shin bilaterally.  Gait and station: Gait is normal. Tandem gait is normal. Romberg is negative. No drift is seen.  Reflexes: Deep tendon reflexes are symmetric and normal bilaterally. Toes are downgoing  bilaterally.   Assessment/Plan:  One. Probable syncopal seizure  The patient has a history consistent with vasovagal syncope, usually induced by abdominal cramping. The patient reports chronic bowel issues that are the likely triggering agent for these events. The patient once again had abdominal cramping, feeling of heat, diaphoresis, and then syncope. The patient was in a chair that did not allow her to fall over and increase the blood pressure to her brain. The patient likely suffered a generalized seizure with the syncopal event. I do not believe that the patient has epilepsy. The patient will be set up for MRI evaluation of the brain, and she will undergo an EEG study. If these evaluations are unremarkable, the patient may return to driving in the near future. The patient will followup through this office if needed.  Marlan Palau MD 11/30/2012 7:11 PM  [facility] [address] [place], [place] [address]  Phone [phone] Fax [phone]

## 2012-12-05 ENCOUNTER — Encounter: Payer: Self-pay | Admitting: Neurology

## 2012-12-07 ENCOUNTER — Ambulatory Visit (INDEPENDENT_AMBULATORY_CARE_PROVIDER_SITE_OTHER): Payer: 59

## 2012-12-07 ENCOUNTER — Telehealth: Payer: Self-pay | Admitting: Neurology

## 2012-12-07 DIAGNOSIS — R55 Syncope and collapse: Secondary | ICD-10-CM

## 2012-12-07 NOTE — Procedures (Signed)
  History:   Monique Gonzalez is a 57 year old patient with a history of several episodes of syncope. The patient recently had an event while on an airplane, associated with jerking, and urinary incontinence. The patient is being evaluated for syncope versus seizure.  This is a routine EEG. No skull defects are noted. Medications include calcium, vitamin D, Celexa, Linzess, Claritin, Mobic, multivitamins, Valtrex, and vitamin E.  EEG classification: Normal awake  Description of the recording: The background rhythms of this recording consists of a fairly well modulated medium amplitude alpha rhythm of 9 Hz that is reactive to eye opening and closure. As the record progresses, the patient appears to remain in the waking state throughout the recording. Photic stimulation was performed, resulting in a bilateral and symmetric photic driving response. Hyperventilation was also performed, resulting in a minimal buildup of the background rhythm activities without significant slowing seen. At no time during the recording does there appear to be evidence of spike or spike wave discharges or evidence of focal slowing. EKG monitor shows no evidence of cardiac rhythm abnormalities with a heart rate of 60.  Impression: This is a normal EEG recording in the waking state. No evidence of ictal or interictal discharges are seen.

## 2012-12-07 NOTE — Telephone Encounter (Signed)
I called patient. The EEG study was unremarkable, MRI of the brain is pending.

## 2012-12-09 ENCOUNTER — Ambulatory Visit (INDEPENDENT_AMBULATORY_CARE_PROVIDER_SITE_OTHER): Payer: 59

## 2012-12-09 DIAGNOSIS — R55 Syncope and collapse: Secondary | ICD-10-CM

## 2012-12-09 MED ORDER — GADOPENTETATE DIMEGLUMINE 469.01 MG/ML IV SOLN: 20.0000 mL | Freq: Once | INTRAVENOUS | Status: AC | PRN

## 2012-12-10 ENCOUNTER — Encounter: Payer: Self-pay | Admitting: Neurology

## 2012-12-10 ENCOUNTER — Telehealth: Payer: Self-pay | Admitting: Neurology

## 2012-12-10 DIAGNOSIS — R9089 Other abnormal findings on diagnostic imaging of central nervous system: Secondary | ICD-10-CM

## 2012-12-10 NOTE — Telephone Encounter (Signed)
I called the patient. The MRI the brain looks unremarkable. EEG study was normal. The patient may get back to driving. The MRI showed some enlargement of the pituitary gland without a defined adenoma. I discussed this with the patient. We will get MRI evaluation of the pituitary gland.

## 2013-01-15 DIAGNOSIS — D333 Benign neoplasm of cranial nerves: Secondary | ICD-10-CM

## 2013-01-16 ENCOUNTER — Ambulatory Visit
Admission: RE | Admit: 2013-01-16 | Discharge: 2013-01-16 | Disposition: A | Discharge status: Home / Self Care | Payer: 59 | Source: Ambulatory Visit | Attending: Neurology | Admitting: Neurology

## 2013-01-16 DIAGNOSIS — R9089 Other abnormal findings on diagnostic imaging of central nervous system: Secondary | ICD-10-CM

## 2013-01-16 MED ORDER — GADOBENATE DIMEGLUMINE 529 MG/ML IV SOLN
10.0000 mL | Freq: Once | INTRAVENOUS | Status: AC | PRN
  Administered 2013-01-16: 10 mL via INTRAVENOUS

## 2013-01-18 ENCOUNTER — Telehealth: Payer: Self-pay | Admitting: Neurology

## 2013-01-18 DIAGNOSIS — D352 Benign neoplasm of pituitary gland: Secondary | ICD-10-CM

## 2013-01-18 NOTE — Telephone Encounter (Signed)
I called patient. The MRI of the pituitary gland confirmed the presence of a microadenoma. I'll check blood work. We will recheck the MRI about one year.

## 2013-01-19 ENCOUNTER — Other Ambulatory Visit (INDEPENDENT_AMBULATORY_CARE_PROVIDER_SITE_OTHER): Payer: Self-pay

## 2013-01-19 DIAGNOSIS — D352 Benign neoplasm of pituitary gland: Secondary | ICD-10-CM

## 2013-01-19 DIAGNOSIS — Z0289 Encounter for other administrative examinations: Secondary | ICD-10-CM

## 2013-01-20 ENCOUNTER — Telehealth: Payer: Self-pay | Admitting: Neurology

## 2013-01-20 LAB — GROWTH HORMONE: Growth Hormone: 0.7 ng/mL (ref 0.0–10.0)

## 2013-01-20 LAB — LUTEINIZING HORMONE: LH: 12.9 m[IU]/mL

## 2013-01-20 LAB — FOLLICLE STIMULATING HORMONE: FSH: 46.6 m[IU]/mL

## 2013-01-20 LAB — PROLACTIN: Prolactin: 27.4 ng/mL — ABNORMAL HIGH (ref 4.8–23.3)

## 2013-01-20 NOTE — Telephone Encounter (Signed)
I called the patient. The blood work was unremarkable, with the exception that the prolactin level was very minimally elevated. I would probably repeat this in about 4 months. I would repeat the MRI of the pituitary gland in one year. I would not treat with medication such as cabergoline at this time.

## 2013-02-11 ENCOUNTER — Telehealth: Payer: Self-pay | Admitting: *Deleted

## 2013-02-11 MED ORDER — CITALOPRAM HYDROBROMIDE 20 MG PO TABS: 20.0000 mg | ORAL_TABLET | Freq: Every day | ORAL | Status: DC

## 2013-02-11 MED ORDER — MELOXICAM 7.5 MG PO TABS: 7.5000 mg | ORAL_TABLET | Freq: Every day | ORAL | Status: DC

## 2013-02-11 NOTE — Telephone Encounter (Signed)
Med refilled.

## 2013-03-23 ENCOUNTER — Other Ambulatory Visit: Payer: Self-pay | Admitting: Physician Assistant

## 2013-04-05 ENCOUNTER — Ambulatory Visit (INDEPENDENT_AMBULATORY_CARE_PROVIDER_SITE_OTHER): Payer: 59 | Admitting: Family Medicine

## 2013-04-05 ENCOUNTER — Encounter: Payer: Self-pay | Admitting: Family Medicine

## 2013-04-05 VITALS — BP 120/68 | HR 62 | Temp 98.0°F | Resp 14 | Ht 69.0 in | Wt 225.0 lb

## 2013-04-05 DIAGNOSIS — M25569 Pain in unspecified knee: Secondary | ICD-10-CM

## 2013-04-05 DIAGNOSIS — M25561 Pain in right knee: Secondary | ICD-10-CM

## 2013-04-05 NOTE — Progress Notes (Signed)
Subjective:    [name], female    DOB: [birth year], 58 y.o.   MRN: [id number]  HPI Patient reports several months of gradually worsening right knee pain. An x-ray obtained of the right knee in 2013 showed osteoarthritis in the medial compartment. This is primarily where her knee is hurting her today. She complains of anteromedial knee pain. He is worsened with full extension as well as with flexion. It hurts with prolonged standing or walking. It hurts going up and down steps. She denies any specific injury to the knee joint. She denies any laxity in the knee joint. She denies any locking of the joint. Past Medical History  Diagnosis Date  . Seasonal allergies   . Osteoarthritis   . Depression   . Hyperlipidemia   . Recurrent cold sores   . Syncope and collapse 11/30/2012  . Vasovagal syncope    Current Outpatient Prescriptions on File Prior to Visit  Medication Sig Dispense Refill  . acyclovir cream (ZOVIRAX) 5 % Apply 1 application topically every 3 (three) hours.  15 g  3  . calcium-vitamin D (OSCAL WITH D) 500-200 MG-UNIT per tablet Take 1 tablet by mouth daily.       . Cholecalciferol (VITAMIN D) 1000 UNITS capsule Take 1,000 Units by mouth daily.        . citalopram (CELEXA) 20 MG tablet Take 1 tablet (20 mg total) by mouth daily.  90 tablet  1  . fish oil-omega-3 fatty acids 1000 MG capsule Take 2 g by mouth daily.      Donnie Aho (GLUCOSAMINE MSM COMPLEX) TABS Take by mouth.        . Linaclotide (LINZESS) 290 MCG CAPS capsule Take 290 mcg by mouth daily.      Marland Kitchen loratadine (CLARITIN) 10 MG tablet Take 10 mg by mouth daily.        Marland Kitchen Lysine 500 MG TABS Take by mouth daily.      . meloxicam (MOBIC) 7.5 MG tablet Take 1 tablet (7.5 mg total) by mouth daily.  90 tablet  1  . Multiple Vitamins-Minerals (MULTIVITAMIN,TX-MINERALS) tablet Take 1 tablet by mouth daily.        . valACYclovir (VALTREX) 1000 MG tablet TAKE 2 TABLETS BY MOUTH EVERY 12 HOURS  FOR 2 DOSES AS NEEDED FOR FLARE  20 tablet  11  . vitamin E 400 UNIT capsule Take 400 Units by mouth daily.         No current facility-administered medications on file prior to visit.   Allergies  Allergen Reactions  . Penicillins Rash  . Sulfa Antibiotics Rash   History   Social History  . Marital Status: Divorced    Spouse Name: N/A    Number of Children: 2  . Years of Education: college   Occupational History  . 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 Smoking status: Former Research scientist (life sciences)  . Smokeless tobacco: Never Used  . Alcohol Use: Yes     Comment: one drink per week  . Drug Use: No  . Sexual Activity: Not on file   Other Topics Concern  . Not on file   Social History Narrative  . No narrative on file      Review of Systems  All other systems reviewed and are negative.       Objective:   Physical Exam  Vitals reviewed. Cardiovascular: Normal rate and regular rhythm.   Pulmonary/Chest: Effort normal and breath  sounds normal.  Musculoskeletal:       Right knee: She exhibits swelling and effusion. She exhibits normal range of motion, normal alignment, no LCL laxity, normal patellar mobility, no bony tenderness, normal meniscus and no MCL laxity. Tenderness found. Medial joint line tenderness noted. No lateral joint line, no MCL and no LCL tenderness noted.          Assessment & Plan:  1. Right medial knee pain I suspect right knee pain secondary osteoarthritis of the right knee. The patient is already taking meloxicam 7.5 mg by mouth daily along with glucosamine and chondroitin without relief.  Therefore I recommended a cortisone injection in the knee. The patient agrees. Using sterile technique from a medial approach I injected a mixture of 2 cc of lidocaine, 2 cc of Marcaine, and 2 cc of 40 mg mL Kenalog into the right knee. The patient tolerated procedure well without complication. Recheck in 2 weeks if no better or sooner if worse. If the  pain worsens I would recommend an MRI of the knee to rule out meniscal tear.

## 2013-05-02 ENCOUNTER — Ambulatory Visit (INDEPENDENT_AMBULATORY_CARE_PROVIDER_SITE_OTHER): Payer: 59 | Admitting: Physician Assistant

## 2013-05-02 ENCOUNTER — Encounter: Payer: Self-pay | Admitting: Physician Assistant

## 2013-05-02 DIAGNOSIS — J069 Acute upper respiratory infection, unspecified: Secondary | ICD-10-CM

## 2013-05-02 MED ORDER — FLUTICASONE PROPIONATE 50 MCG/ACT NA SUSP: 2.0000 | Freq: Every day | NASAL | Status: DC

## 2013-05-02 NOTE — Progress Notes (Signed)
Patient ID: Rori Goar MRN: [id number], DOB: [birth year], 58 y.o. Date of Encounter: 05/02/2013, 12:01 PM    Chief Complaint: No chief complaint on file.    HPI: 58 y.o. year old female says that she started feeling a little bit sick on Friday 04/29/13. Felt much worse on Saturday. Now has a lot of congestion in her nasal area and sound extremely nasal when she talks. Says that "feels like there is a golf ball right there"--pointing to the nasal bridge. Also says her left ear feels stopped up. Has drainage down her throat. No chest congestion. No sore throat. Has had no fevers or chills.     Home Meds: See attached medication section for any medications that were entered at today's visit. The computer does not put those onto this list.The following list is a list of meds entered prior to today's visit.   Current Outpatient Prescriptions on File Prior to Visit  Medication Sig Dispense Refill  . acyclovir cream (ZOVIRAX) 5 % Apply 1 application topically every 3 (three) hours.  15 g  3  . calcium-vitamin D (OSCAL WITH D) 500-200 MG-UNIT per tablet Take 1 tablet by mouth daily.       . Cholecalciferol (VITAMIN D) 1000 UNITS capsule Take 1,000 Units by mouth daily.        . citalopram (CELEXA) 20 MG tablet Take 1 tablet (20 mg total) by mouth daily.  90 tablet  1  . fish oil-omega-3 fatty acids 1000 MG capsule Take 2 g by mouth daily.      Donnie Aho (GLUCOSAMINE MSM COMPLEX) TABS Take by mouth.        . Linaclotide (LINZESS) 290 MCG CAPS capsule Take 290 mcg by mouth daily.      Marland Kitchen loratadine (CLARITIN) 10 MG tablet Take 10 mg by mouth daily.        Marland Kitchen Lysine 500 MG TABS Take by mouth daily.      . meloxicam (MOBIC) 7.5 MG tablet Take 1 tablet (7.5 mg total) by mouth daily.  90 tablet  1  . Multiple Vitamins-Minerals (MULTIVITAMIN,TX-MINERALS) tablet Take 1 tablet by mouth daily.        . valACYclovir (VALTREX) 1000 MG tablet TAKE 2 TABLETS BY MOUTH EVERY 12 HOURS FOR 2  DOSES AS NEEDED FOR FLARE  20 tablet  11  . vitamin E 400 UNIT capsule Take 400 Units by mouth daily.         No current facility-administered medications on file prior to visit.    Allergies:  Allergies  Allergen Reactions  . Penicillins Rash  . Sulfa Antibiotics Rash      Review of Systems: See HPI for pertinent ROS. All other ROS negative.    Physical Exam: There were no vitals taken for this visit., There is no weight on file to calculate BMI. General:  WF. Appears in no acute distress. HEENT: Normocephalic, atraumatic, eyes without discharge, sclera non-icteric, nares are without discharge. Bilateral auditory canals clear, TM's are without perforation, pearly grey and translucent with reflective cone of light bilaterally. Left TM is more dull thatn the Right one but has no effusion/fluid and no erythema and no golden coloration. Oral cavity moist, posterior pharynx without exudate, erythema, peritonsillar abscess. No tenderness with percussion of frontal or maxillary sinuses. She sounds extremely congested and "nasally" when she talks.  Neck: Supple. No thyromegaly. No lymphadenopathy. Lungs: Clear bilaterally to auscultation without wheezes, rales, or rhonchi. Breathing is unlabored. Heart: Regular rhythm. No murmurs,  rubs, or gallops. Msk:  Strength and tone normal for age. Extremities/Skin: Warm and dry. Neuro: Alert and oriented X 3. Moves all extremities spontaneously. Gait is normal. CNII-XII grossly in tact. Psych:  Responds to questions appropriately with a normal affect.     ASSESSMENT AND PLAN:  58 y.o. year old female with  1. Upper respiratory infection Multiple  of her family members who live in her house have had similar illness. All of them have improved with no antibiotics. She feels quite certain that she has the same illness that they have which indicates it is a virus which has spontaneously resolved. Therefore will not prescribe antibiotics at this time.  Discussed other medication she can use for symptom relief. She is already taking NyQuil and DayQuil - as a decongestant. Discussed using prednisone but she defers this. Will add Flonase. She will monitor symptoms over the next 2 days. If symptoms not improved in 2 days then we'll consider adding antibiotics. - fluticasone (FLONASE) 50 MCG/ACT nasal spray; Place 2 sprays into both nostrils daily.  Dispense: 16 g; Refill: [address] [place], [place], [facility] 05/02/2013 12:01 PM

## 2013-05-09 ENCOUNTER — Telehealth: Payer: Self-pay | Admitting: *Deleted

## 2013-05-09 MED ORDER — AZITHROMYCIN 250 MG PO TABS: ORAL_TABLET | ORAL | Status: DC

## 2013-05-09 NOTE — Telephone Encounter (Signed)
She is allergic to penicillin and Augmentin. Therefore will treat with azithromycin. Please send prescription for:  Azithromycin 250 mg On day 1:  take 2 daily.  On days 2-5: take one daily. Dispense #6 (1 pack) 0 refills

## 2013-05-09 NOTE — Telephone Encounter (Signed)
Med refilled at pts pharmacy and pt aware.

## 2013-05-09 NOTE — Telephone Encounter (Signed)
Wants antibiotic thick green secretions, lots of sinus pressure and headache. Please advise!

## 2013-05-19 ENCOUNTER — Telehealth: Payer: Self-pay | Admitting: Neurology

## 2013-05-19 DIAGNOSIS — D352 Benign neoplasm of pituitary gland: Secondary | ICD-10-CM

## 2013-05-19 DIAGNOSIS — E229 Hyperfunction of pituitary gland, unspecified: Principal | ICD-10-CM

## 2013-05-19 NOTE — Telephone Encounter (Signed)
I called patient. I will put an order in for a prolactin level to be drawn, the patient will come in to have blood work done at her convenience.

## 2013-05-19 NOTE — Telephone Encounter (Signed)
Message copied by Margette Fast on Thu May 19, 2013  6:16 PM ------      Message from: Margette Fast      Created: Thu Jan 20, 2013  5:22 PM       Recheck prolactin level ------

## 2013-05-31 ENCOUNTER — Other Ambulatory Visit: Payer: Self-pay | Admitting: Family Medicine

## 2013-05-31 DIAGNOSIS — E785 Hyperlipidemia, unspecified: Secondary | ICD-10-CM

## 2013-05-31 DIAGNOSIS — Z0000 Encounter for general adult medical examination without abnormal findings: Secondary | ICD-10-CM

## 2013-06-02 ENCOUNTER — Other Ambulatory Visit: Payer: Self-pay | Admitting: Physician Assistant

## 2013-06-02 ENCOUNTER — Other Ambulatory Visit: Payer: 59

## 2013-06-02 DIAGNOSIS — Z0000 Encounter for general adult medical examination without abnormal findings: Secondary | ICD-10-CM

## 2013-06-02 DIAGNOSIS — E785 Hyperlipidemia, unspecified: Secondary | ICD-10-CM

## 2013-06-02 LAB — CBC WITH DIFFERENTIAL/PLATELET
Basophils Absolute: 0 10*3/uL (ref 0.0–0.1)
Basophils Relative: 1 % (ref 0–1)
Eosinophils Absolute: 0 10*3/uL (ref 0.0–0.7)
Eosinophils Relative: 1 % (ref 0–5)
HCT: 37.2 % (ref 36.0–46.0)
Hemoglobin: 12.7 g/dL (ref 12.0–15.0)
LYMPHS ABS: 1.2 10*3/uL (ref 0.7–4.0)
LYMPHS PCT: 30 % (ref 12–46)
MCH: 29.9 pg (ref 26.0–34.0)
MCHC: 34.1 g/dL (ref 30.0–36.0)
MCV: 87.5 fL (ref 78.0–100.0)
Monocytes Absolute: 0.4 10*3/uL (ref 0.1–1.0)
Monocytes Relative: 10 % (ref 3–12)
Neutro Abs: 2.3 10*3/uL (ref 1.7–7.7)
Neutrophils Relative %: 58 % (ref 43–77)
Platelets: 189 10*3/uL (ref 150–400)
RBC: 4.25 MIL/uL (ref 3.87–5.11)
RDW: 14.5 % (ref 11.5–15.5)
WBC: 4 10*3/uL (ref 4.0–10.5)

## 2013-06-02 LAB — COMPLETE METABOLIC PANEL WITH GFR
ALK PHOS: 40 U/L (ref 39–117)
ALT: 13 U/L (ref 0–35)
AST: 14 U/L (ref 0–37)
Albumin: 3.9 g/dL (ref 3.5–5.2)
BILIRUBIN TOTAL: 0.4 mg/dL (ref 0.2–1.2)
BUN: 19 mg/dL (ref 6–23)
CO2: 29 mEq/L (ref 19–32)
Calcium: 9.1 mg/dL (ref 8.4–10.5)
Chloride: 104 mEq/L (ref 96–112)
Creat: 0.85 mg/dL (ref 0.50–1.10)
GFR, EST NON AFRICAN AMERICAN: 75 mL/min
GFR, Est African American: 87 mL/min
Glucose, Bld: 74 mg/dL (ref 70–99)
Potassium: 4.5 mEq/L (ref 3.5–5.3)
Sodium: 141 mEq/L (ref 135–145)
Total Protein: 5.9 g/dL — ABNORMAL LOW (ref 6.0–8.3)

## 2013-06-02 LAB — LIPID PANEL
Cholesterol: 176 mg/dL (ref 0–200)
HDL: 57 mg/dL (ref >39)
LDL CALC: 102 mg/dL — AB (ref 0–99)
Total CHOL/HDL Ratio: 3.1 Ratio
Triglycerides: 83 mg/dL (ref <150)
VLDL: 17 mg/dL (ref 0–40)

## 2013-06-04 LAB — PROLACTIN: Prolactin: 20.2 ng/mL

## 2013-06-08 ENCOUNTER — Telehealth: Payer: Self-pay | Admitting: Neurology

## 2013-06-08 NOTE — Telephone Encounter (Signed)
I called patient. The prolactin level was in the normal range. I discussed this with patient. We'll be checking MRI evaluations of the pituitary gland intermittently, last scan was done in November 2014, did not show optic nerve or optic chiasm compression.

## 2013-06-15 ENCOUNTER — Encounter: Payer: Self-pay | Admitting: Physician Assistant

## 2013-06-15 ENCOUNTER — Ambulatory Visit (INDEPENDENT_AMBULATORY_CARE_PROVIDER_SITE_OTHER): Payer: 59 | Admitting: Physician Assistant

## 2013-06-15 VITALS — BP 142/74 | HR 60 | Temp 98.1°F | Resp 14 | Wt 228.0 lb

## 2013-06-15 DIAGNOSIS — B009 Herpesviral infection, unspecified: Secondary | ICD-10-CM

## 2013-06-15 DIAGNOSIS — M25569 Pain in unspecified knee: Secondary | ICD-10-CM

## 2013-06-15 DIAGNOSIS — F3289 Other specified depressive episodes: Secondary | ICD-10-CM

## 2013-06-15 DIAGNOSIS — F32A Depression, unspecified: Secondary | ICD-10-CM

## 2013-06-15 DIAGNOSIS — R55 Syncope and collapse: Secondary | ICD-10-CM

## 2013-06-15 DIAGNOSIS — E785 Hyperlipidemia, unspecified: Secondary | ICD-10-CM

## 2013-06-15 DIAGNOSIS — F329 Major depressive disorder, single episode, unspecified: Secondary | ICD-10-CM

## 2013-06-15 DIAGNOSIS — M1990 Unspecified osteoarthritis, unspecified site: Secondary | ICD-10-CM

## 2013-06-15 DIAGNOSIS — M25561 Pain in right knee: Secondary | ICD-10-CM

## 2013-06-15 DIAGNOSIS — B001 Herpesviral vesicular dermatitis: Secondary | ICD-10-CM

## 2013-06-15 NOTE — Progress Notes (Signed)
[name] MRN: [id number], DOB: 01-30-1956, 58 y.o. Date of Encounter: @DATE @  Chief Complaint:  Chief Complaint  Patient presents with  . 6 month check    HPI: 58 y.o. year old white female  presents routine followup office visit.  SHe recently had fasting lab work done and is here for followup.  She states she is taking her Linzess every day now. This is keeping her with regular bowel movements. It is causing no diarrhea. She has had no further vasovagal syncope since taking the Linzess daily.  She feels that the Celexa is working well as far as controlling her depression. Feels that her mood is stable and she is having no adverse effects.  She notes that she was originally prescribed Mobic to help with osteoarthritis of her hands -- now she has also been getting good relief using  it for her osteoarthritis of her knees.  SHe is seeing Dr. Jannifer Franklin at [facility] neurology for evaluation of possible seizure versus syncope. As wells, he has been monitoring prolactin levels and following pituitary adenoma seen on scan. She says that she is scheduled to have a followup MRI in the fall.  SHe has no complaints today. As noted she has gained back some weight and is planning to get this back under control.   Past Medical History  Diagnosis Date  . Seasonal allergies   . Osteoarthritis   . Depression   . Hyperlipidemia   . Recurrent cold sores   . Syncope and collapse 11/30/2012  . Vasovagal syncope      Home Meds: See attached medication section for current medication list. Any medications entered into computer today will not appear on this note's list. The medications listed below were entered prior to today. Current Outpatient Prescriptions on File Prior to Visit  Medication Sig Dispense Refill  . acyclovir cream (ZOVIRAX) 5 % Apply 1 application topically every 3 (three) hours.  15 g  3  . calcium-vitamin D (OSCAL WITH D) 500-200 MG-UNIT per tablet Take 1 tablet by  mouth daily.       . Cholecalciferol (VITAMIN D) 1000 UNITS capsule Take 2,000 Units by mouth daily.       . citalopram (CELEXA) 20 MG tablet Take 1 tablet (20 mg total) by mouth daily.  90 tablet  1  . fish oil-omega-3 fatty acids 1000 MG capsule Take 2 g by mouth daily.      . fluticasone (FLONASE) 50 MCG/ACT nasal spray Place 2 sprays into both nostrils daily.  16 g  6  . Glucos-MSM-C-Mn-Ginger-Willow (GLUCOSAMINE MSM COMPLEX) TABS Take by mouth.        . Linaclotide (LINZESS) 290 MCG CAPS capsule Take 290 mcg by mouth daily.      Marland Kitchen loratadine (CLARITIN) 10 MG tablet Take 10 mg by mouth daily.        Marland Kitchen Lysine 500 MG TABS Take by mouth daily.      . meloxicam (MOBIC) 7.5 MG tablet Take 1 tablet (7.5 mg total) by mouth daily.  90 tablet  1  . Multiple Vitamins-Minerals (MULTIVITAMIN,TX-MINERALS) tablet Take 1 tablet by mouth daily.        . valACYclovir (VALTREX) 1000 MG tablet TAKE 2 TABLETS BY MOUTH EVERY 12 HOURS FOR 2 DOSES AS NEEDED FOR FLARE  20 tablet  11  . vitamin E 400 UNIT capsule Take 400 Units by mouth daily.         No current facility-administered medications on file prior  to visit.    Allergies:  Allergies  Allergen Reactions  . Penicillins Rash  . Sulfa Antibiotics Rash    History   Social History  . Marital Status: Divorced    Spouse Name: N/A    Number of Children: 2  . Years of Education: college   Occupational History  . 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 Smoking status: Former Research scientist (life sciences)  . Smokeless tobacco: Never Used  . Alcohol Use: Yes     Comment: one drink per week  . Drug Use: No  . Sexual Activity: Not on file   Other Topics Concern  . Not on file   Social History Narrative  . No narrative on file    Family History  Problem Relation Age of Onset  . Sudden death Neg Hx   . Hypertension Neg Hx   . Hyperlipidemia Neg Hx   . Heart attack Neg Hx   . Diabetes Neg Hx   . Stroke Mother   . Arthritis Mother   . Cancer  Father   . Arthritis Sister   . Cancer Maternal Grandmother   . Stroke Maternal Grandfather   . Cancer Maternal Grandfather   . Cancer Paternal Grandmother   . Heart disease Paternal Grandmother   . Stroke Paternal Grandfather   . Arthritis Sister      Review of Systems:  See HPI for pertinent ROS. All other ROS negative.    Physical Exam: Blood pressure 142/74, pulse 60, temperature 98.1 F (36.7 C), temperature source Oral, resp. rate 14, weight 228 lb (103.42 kg)., Body mass index is 33.65 kg/(m^2). General: WF. Appears in no acute distress. Neck: Supple. No thyromegaly. No lymphadenopathy. No carotid bruits. Lungs: Clear bilaterally to auscultation without wheezes, rales, or rhonchi. Breathing is unlabored. Heart: RRR with S1 S2. No murmurs, rubs, or gallops. Abdomen: Soft, non-tender, non-distended with normoactive bowel sounds. No hepatomegaly. No rebound/guarding. No obvious abdominal masses. Musculoskeletal:  Strength and tone normal for age. Extremities/Skin: Warm and dry.  No edema. Neuro: Alert and oriented X 3. Moves all extremities spontaneously. Gait is normal. CNII-XII grossly in tact. Psych:  Responds to questions appropriately with a normal affect.     ASSESSMENT AND PLAN:  58 y.o. year old female with  1. Depression Controlled with current dose of Celexa  2. Recurrent cold sores She uses listed medications as needed.  3. Right knee pain controlled with Mobic when necessary  4. Osteoarthritis Controlled with Mobic as needed  5. Syncope and collapse History of episodes of this which are felt to be secondary to vasovagal episodes. These have been secondary to IBS/chronic constipation. Treated with daily Linzess.  she is still seeing Dr. Jannifer Franklin at [facility] neurology for followup. She says that he plans to get another MRI this fall. Prior scan showed a Pituitary adenoma which he is following. She did have a prolactin level drawn recently.  6.  Hyperlipidemia SHe had required medications for this in the past. SHe just had a lipid panel which remains good off of medication. LDL is 102  And other parameters are all  Normal.  7. Reports that she is overdue for pelvic exam and Pap smear. She will follow up with scheduling this with GYN. Defers having this done today.  8. mammogram: She says that she had one last year. She is due for followup and she will call to schedule this.  9. screening colonoscopy: Performed December 2007. Repeat 10 years.  10. Immunizations:  She knows  that she has had a tetanus within the past 10 years but does not have the exact date. Will discuss Zostavax at age 4.  ROV 6 months, sooner if needed.   Marin Olp [place], [place], [facility] 06/15/2013 5:09 PM

## 2013-06-17 ENCOUNTER — Encounter: Payer: Self-pay | Admitting: Physician Assistant

## 2013-06-17 ENCOUNTER — Encounter: Payer: Self-pay | Admitting: Family Medicine

## 2013-07-05 DIAGNOSIS — K5904 Chronic idiopathic constipation: Secondary | ICD-10-CM

## 2013-07-05 HISTORY — DX: Chronic idiopathic constipation: K59.04

## 2013-08-26 ENCOUNTER — Other Ambulatory Visit: Payer: Self-pay | Admitting: *Deleted

## 2013-08-26 MED ORDER — MELOXICAM 7.5 MG PO TABS: 7.5000 mg | ORAL_TABLET | Freq: Every day | ORAL | Status: DC

## 2013-08-26 MED ORDER — CITALOPRAM HYDROBROMIDE 20 MG PO TABS: 20.0000 mg | ORAL_TABLET | Freq: Every day | ORAL | Status: DC

## 2013-08-26 NOTE — Telephone Encounter (Signed)
Pt called needing refill on her medications, scripts filled and pt aware.

## 2013-09-17 ENCOUNTER — Inpatient Hospital Stay (HOSPITAL_COMMUNITY): Payer: 59

## 2013-09-17 ENCOUNTER — Emergency Department (HOSPITAL_BASED_OUTPATIENT_CLINIC_OR_DEPARTMENT_OTHER): Payer: 59

## 2013-09-17 ENCOUNTER — Encounter (HOSPITAL_BASED_OUTPATIENT_CLINIC_OR_DEPARTMENT_OTHER): Payer: Self-pay | Admitting: Emergency Medicine

## 2013-09-17 ENCOUNTER — Inpatient Hospital Stay (HOSPITAL_BASED_OUTPATIENT_CLINIC_OR_DEPARTMENT_OTHER)
Admission: EM | Admit: 2013-09-17 | Discharge: 2013-09-20 | DRG: 392 | Disposition: A | Discharge status: Home / Self Care | Payer: 59 | Attending: Internal Medicine | Admitting: Internal Medicine

## 2013-09-17 DIAGNOSIS — K5900 Constipation, unspecified: Secondary | ICD-10-CM | POA: Diagnosis present

## 2013-09-17 DIAGNOSIS — I10 Essential (primary) hypertension: Secondary | ICD-10-CM | POA: Diagnosis present

## 2013-09-17 DIAGNOSIS — M1990 Unspecified osteoarthritis, unspecified site: Secondary | ICD-10-CM | POA: Diagnosis present

## 2013-09-17 DIAGNOSIS — Z882 Allergy status to sulfonamides status: Secondary | ICD-10-CM

## 2013-09-17 DIAGNOSIS — Z823 Family history of stroke: Secondary | ICD-10-CM

## 2013-09-17 DIAGNOSIS — E876 Hypokalemia: Secondary | ICD-10-CM | POA: Diagnosis present

## 2013-09-17 DIAGNOSIS — Z791 Long term (current) use of non-steroidal anti-inflammatories (NSAID): Secondary | ICD-10-CM

## 2013-09-17 DIAGNOSIS — Z8249 Family history of ischemic heart disease and other diseases of the circulatory system: Secondary | ICD-10-CM

## 2013-09-17 DIAGNOSIS — K5289 Other specified noninfective gastroenteritis and colitis: Principal | ICD-10-CM | POA: Diagnosis present

## 2013-09-17 DIAGNOSIS — F3289 Other specified depressive episodes: Secondary | ICD-10-CM | POA: Diagnosis present

## 2013-09-17 DIAGNOSIS — R55 Syncope and collapse: Secondary | ICD-10-CM

## 2013-09-17 DIAGNOSIS — E785 Hyperlipidemia, unspecified: Secondary | ICD-10-CM | POA: Diagnosis present

## 2013-09-17 DIAGNOSIS — K529 Noninfective gastroenteritis and colitis, unspecified: Secondary | ICD-10-CM

## 2013-09-17 DIAGNOSIS — Z87891 Personal history of nicotine dependence: Secondary | ICD-10-CM

## 2013-09-17 DIAGNOSIS — Z880 Allergy status to penicillin: Secondary | ICD-10-CM

## 2013-09-17 DIAGNOSIS — F329 Major depressive disorder, single episode, unspecified: Secondary | ICD-10-CM | POA: Diagnosis present

## 2013-09-17 LAB — URINE MICROSCOPIC-ADD ON

## 2013-09-17 LAB — COMPREHENSIVE METABOLIC PANEL
ALK PHOS: 71 U/L (ref 39–117)
ALT: 19 U/L (ref 0–35)
ANION GAP: 17 — AB (ref 5–15)
AST: 23 U/L (ref 0–37)
Albumin: 4.2 g/dL (ref 3.5–5.2)
BUN: 21 mg/dL (ref 6–23)
CO2: 23 mEq/L (ref 19–32)
Calcium: 10.3 mg/dL (ref 8.4–10.5)
Chloride: 101 mEq/L (ref 96–112)
Creatinine, Ser: 0.9 mg/dL (ref 0.50–1.10)
GFR calc non Af Amer: 68 mL/min — ABNORMAL LOW (ref >90)
GFR, EST AFRICAN AMERICAN: 79 mL/min — AB (ref >90)
GLUCOSE: 131 mg/dL — AB (ref 70–99)
Potassium: 3.1 mEq/L — ABNORMAL LOW (ref 3.7–5.3)
Sodium: 141 mEq/L (ref 137–147)
TOTAL PROTEIN: 6.9 g/dL (ref 6.0–8.3)
Total Bilirubin: 0.3 mg/dL (ref 0.3–1.2)

## 2013-09-17 LAB — CBC WITH DIFFERENTIAL/PLATELET
Basophils Absolute: 0 10*3/uL (ref 0.0–0.1)
Basophils Relative: 0 % (ref 0–1)
EOS ABS: 0 10*3/uL (ref 0.0–0.7)
Eosinophils Relative: 1 % (ref 0–5)
HEMATOCRIT: 38.2 % (ref 36.0–46.0)
HEMOGLOBIN: 13.1 g/dL (ref 12.0–15.0)
LYMPHS ABS: 1.5 10*3/uL (ref 0.7–4.0)
Lymphocytes Relative: 24 % (ref 12–46)
MCH: 30.5 pg (ref 26.0–34.0)
MCHC: 34.3 g/dL (ref 30.0–36.0)
MCV: 88.8 fL (ref 78.0–100.0)
MONOS PCT: 8 % (ref 3–12)
Monocytes Absolute: 0.5 10*3/uL (ref 0.1–1.0)
NEUTROS PCT: 68 % (ref 43–77)
Neutro Abs: 4.3 10*3/uL (ref 1.7–7.7)
Platelets: 165 10*3/uL (ref 150–400)
RBC: 4.3 MIL/uL (ref 3.87–5.11)
RDW: 12.2 % (ref 11.5–15.5)
WBC: 6.3 10*3/uL (ref 4.0–10.5)

## 2013-09-17 LAB — URINALYSIS, ROUTINE W REFLEX MICROSCOPIC
BILIRUBIN URINE: NEGATIVE
Glucose, UA: NEGATIVE mg/dL
Hgb urine dipstick: NEGATIVE
KETONES UR: NEGATIVE mg/dL
Nitrite: NEGATIVE
Protein, ur: NEGATIVE mg/dL
Specific Gravity, Urine: 1.011 (ref 1.005–1.030)
UROBILINOGEN UA: 1 mg/dL (ref 0.0–1.0)
pH: 7 (ref 5.0–8.0)

## 2013-09-17 LAB — LACTIC ACID, PLASMA: Lactic Acid, Venous: 2.1 mmol/L (ref 0.5–2.2)

## 2013-09-17 LAB — LACTATE DEHYDROGENASE: LDH: 202 U/L (ref 94–250)

## 2013-09-17 LAB — LIPASE, BLOOD: Lipase: 45 U/L (ref 11–59)

## 2013-09-17 MED ORDER — ONDANSETRON HCL 4 MG PO TABS: 4.0000 mg | ORAL_TABLET | Freq: Four times a day (QID) | ORAL | Status: DC | PRN

## 2013-09-17 MED ORDER — SODIUM CHLORIDE 0.9 % IV SOLN
INTRAVENOUS | Status: DC
Start: 2013-09-17 — End: 2013-09-20
  Administered 2013-09-17 – 2013-09-20 (×6): via INTRAVENOUS

## 2013-09-17 MED ORDER — FENTANYL CITRATE 0.05 MG/ML IJ SOLN
100.0000 ug | Freq: Once | INTRAMUSCULAR | Status: AC
  Administered 2013-09-17: 100 ug via INTRAVENOUS

## 2013-09-17 MED ORDER — POTASSIUM CHLORIDE CRYS ER 20 MEQ PO TBCR
40.0000 meq | EXTENDED_RELEASE_TABLET | Freq: Two times a day (BID) | ORAL | Status: AC
  Administered 2013-09-17 (×2): 40 meq via ORAL
  Filled 2013-09-17 (×2): qty 2

## 2013-09-17 MED ORDER — SODIUM CHLORIDE 0.9 % IJ SOLN
INTRAMUSCULAR | Status: AC
  Filled 2013-09-17: qty 100

## 2013-09-17 MED ORDER — METOCLOPRAMIDE HCL 5 MG/ML IJ SOLN
5.0000 mg | Freq: Once | INTRAMUSCULAR | Status: AC
  Administered 2013-09-17: 5 mg via INTRAVENOUS
  Filled 2013-09-17: qty 2

## 2013-09-17 MED ORDER — FENTANYL CITRATE 0.05 MG/ML IJ SOLN
INTRAMUSCULAR | Status: AC
  Administered 2013-09-17: 100 ug via INTRAVENOUS
  Filled 2013-09-17: qty 2

## 2013-09-17 MED ORDER — SODIUM CHLORIDE 0.9 % IV SOLN
INTRAVENOUS | Status: DC
Start: 2013-09-17 — End: 2013-09-17
  Administered 2013-09-17: 05:00:00 via INTRAVENOUS

## 2013-09-17 MED ORDER — METRONIDAZOLE IN NACL 5-0.79 MG/ML-% IV SOLN
500.0000 mg | Freq: Three times a day (TID) | INTRAVENOUS | Status: DC
  Administered 2013-09-17 – 2013-09-20 (×9): 500 mg via INTRAVENOUS
  Filled 2013-09-17 (×12): qty 100

## 2013-09-17 MED ORDER — CIPROFLOXACIN IN D5W 400 MG/200ML IV SOLN
400.0000 mg | Freq: Two times a day (BID) | INTRAVENOUS | Status: DC
  Administered 2013-09-17 – 2013-09-20 (×6): 400 mg via INTRAVENOUS
  Filled 2013-09-17 (×8): qty 200

## 2013-09-17 MED ORDER — SODIUM CHLORIDE 0.9 % IV SOLN: INTRAVENOUS | Status: DC

## 2013-09-17 MED ORDER — FENTANYL CITRATE 0.05 MG/ML IJ SOLN
INTRAMUSCULAR | Status: AC
  Filled 2013-09-17: qty 2

## 2013-09-17 MED ORDER — ENOXAPARIN SODIUM 40 MG/0.4ML ~~LOC~~ SOLN
40.0000 mg | SUBCUTANEOUS | Status: DC
  Administered 2013-09-17 – 2013-09-18 (×2): 40 mg via SUBCUTANEOUS
  Filled 2013-09-17 (×4): qty 0.4

## 2013-09-17 MED ORDER — BIOTENE DRY MOUTH MT LIQD
15.0000 mL | Freq: Two times a day (BID) | OROMUCOSAL | Status: DC
  Administered 2013-09-17 – 2013-09-18 (×3): 15 mL via OROMUCOSAL

## 2013-09-17 MED ORDER — FENTANYL CITRATE 0.05 MG/ML IJ SOLN
100.0000 ug | Freq: Once | INTRAMUSCULAR | Status: AC
  Administered 2013-09-17: 100 ug via INTRAVENOUS
  Filled 2013-09-17: qty 2

## 2013-09-17 MED ORDER — HYDROMORPHONE HCL PF 1 MG/ML IJ SOLN
2.0000 mg | INTRAMUSCULAR | Status: DC | PRN
  Administered 2013-09-17 – 2013-09-18 (×4): 2 mg via INTRAVENOUS
  Filled 2013-09-17 (×4): qty 2

## 2013-09-17 MED ORDER — CIPROFLOXACIN IN D5W 400 MG/200ML IV SOLN
400.0000 mg | Freq: Once | INTRAVENOUS | Status: AC
  Administered 2013-09-17: 400 mg via INTRAVENOUS
  Filled 2013-09-17: qty 200

## 2013-09-17 MED ORDER — CITALOPRAM HYDROBROMIDE 20 MG PO TABS
20.0000 mg | ORAL_TABLET | Freq: Every day | ORAL | Status: DC
  Administered 2013-09-17 – 2013-09-20 (×4): 20 mg via ORAL
  Filled 2013-09-17 (×4): qty 1

## 2013-09-17 MED ORDER — CHLORHEXIDINE GLUCONATE 0.12 % MT SOLN
15.0000 mL | Freq: Two times a day (BID) | OROMUCOSAL | Status: DC
  Administered 2013-09-17 – 2013-09-18 (×2): 15 mL via OROMUCOSAL
  Filled 2013-09-17 (×6): qty 15

## 2013-09-17 MED ORDER — MORPHINE SULFATE 2 MG/ML IJ SOLN
1.0000 mg | INTRAMUSCULAR | Status: DC | PRN
  Administered 2013-09-17 – 2013-09-19 (×2): 2 mg via INTRAVENOUS
  Filled 2013-09-17 (×2): qty 1

## 2013-09-17 MED ORDER — FLUTICASONE PROPIONATE 50 MCG/ACT NA SUSP
2.0000 | Freq: Every day | NASAL | Status: DC
  Administered 2013-09-17 – 2013-09-20 (×4): 2 via NASAL
  Filled 2013-09-17: qty 16

## 2013-09-17 MED ORDER — IOHEXOL 350 MG/ML SOLN
100.0000 mL | Freq: Once | INTRAVENOUS | Status: AC | PRN
  Administered 2013-09-17: 100 mL via INTRAVENOUS

## 2013-09-17 MED ORDER — HYDRALAZINE HCL 20 MG/ML IJ SOLN: 5.0000 mg | Freq: Four times a day (QID) | INTRAMUSCULAR | Status: DC | PRN

## 2013-09-17 MED ORDER — IOHEXOL 300 MG/ML  SOLN
50.0000 mL | Freq: Once | INTRAMUSCULAR | Status: AC | PRN
  Administered 2013-09-17: 100 mL via INTRAVENOUS

## 2013-09-17 MED ORDER — ONDANSETRON HCL 4 MG/2ML IJ SOLN
4.0000 mg | Freq: Once | INTRAMUSCULAR | Status: AC
  Administered 2013-09-17: 4 mg via INTRAVENOUS

## 2013-09-17 MED ORDER — ONDANSETRON HCL 4 MG/2ML IJ SOLN
4.0000 mg | Freq: Four times a day (QID) | INTRAMUSCULAR | Status: DC | PRN
  Administered 2013-09-17 – 2013-09-19 (×7): 4 mg via INTRAVENOUS
  Filled 2013-09-17 (×7): qty 2

## 2013-09-17 MED ORDER — HYDROMORPHONE HCL PF 1 MG/ML IJ SOLN
1.0000 mg | INTRAMUSCULAR | Status: DC | PRN
  Administered 2013-09-17: 1 mg via INTRAVENOUS
  Filled 2013-09-17 (×2): qty 1

## 2013-09-17 MED ORDER — IOHEXOL 300 MG/ML  SOLN
50.0000 mL | Freq: Once | INTRAMUSCULAR | Status: AC | PRN
  Administered 2013-09-17: 50 mL via ORAL

## 2013-09-17 MED ORDER — ONDANSETRON HCL 4 MG/2ML IJ SOLN
INTRAMUSCULAR | Status: AC
  Administered 2013-09-17: 4 mg via INTRAVENOUS
  Filled 2013-09-17: qty 2

## 2013-09-17 MED ORDER — METRONIDAZOLE IN NACL 5-0.79 MG/ML-% IV SOLN
500.0000 mg | Freq: Once | INTRAVENOUS | Status: AC
  Administered 2013-09-17: 500 mg via INTRAVENOUS
  Filled 2013-09-17: qty 100

## 2013-09-17 NOTE — Care Management (Signed)
UR completed.    Tatum Corl Wise Kathline Banbury, RN, BSN Phone #[phone]  

## 2013-09-17 NOTE — ED Notes (Signed)
Per EMS pt c/o center abdominal pain with nausea, states hx of same after eating certain foods.

## 2013-09-17 NOTE — Progress Notes (Signed)
Pt admitted to the unit at 0914. Pt mental status is A&Ox4. Pt oriented to room, staff, and call bell. Skin is intact. Full assessment charted in CH[facility]. Call bell within reach. Visitor guidelines reviewed w/ pt and/or family.

## 2013-09-17 NOTE — H&P (Signed)
[facility] History and Physical  Devone Bonilla NAT:[id number] DOB: Jul 31, 1955 DOA: 09/17/2013  Referring physician: PCP PCP: Odette Fraction, MD   Chief Complaint: sent in for abd pain and colitis.   HPI: Monique Gonzalez is a 58 y.o. female with prior h/o hypertension, questionable irritable bowel syndrome, multiple episodes of episodic abdominal pain of unclear etiology, hyperlipidemia, vaso vagal syncope, reports having another episode of abdominal pain, associated with nausea, no vomiting. She denies fever or chills. [place] , she underwent a CT abdomen and pelvis showing colitis of the transverse and descending colon infectious vs inflammatory in origin. She denies any rectal bleeding. She was transferred to [facility] to be on [facility] service for admission for further evaluation and management. She denies any chest pain or sob, palpitations, neck pain . She does not have a history of CAD.    Review of Systems:  See hpi otherwise negative. .     Past Medical History  Diagnosis Date  . Seasonal allergies   . Osteoarthritis   . Depression   . Hyperlipidemia   . Recurrent cold sores   . Syncope and collapse 11/30/2012  . Vasovagal syncope    Past Surgical History  Procedure Laterality Date  . Tonsillectomy     Social History:  reports that she has quit smoking. She has never used smokeless tobacco. She reports that she drinks alcohol. She reports that she does not use illicit drugs.  Allergies  Allergen Reactions  . Penicillins Rash  . Sulfa Antibiotics Rash    Family History  Problem Relation Age of Onset  . Sudden death Neg Hx   . Hypertension Neg Hx   . Hyperlipidemia Neg Hx   . Heart attack Neg Hx   . Diabetes Neg Hx   . Stroke Mother   . Arthritis Mother   . Cancer Father   . Arthritis Sister   . Cancer Maternal Grandmother   . Stroke Maternal Grandfather   . Cancer Maternal Grandfather   . Cancer Paternal Grandmother   . Heart disease  Paternal Grandmother   . Stroke Paternal Grandfather   . Arthritis Sister      Prior to Admission medications   Medication Sig Start Date End Date Taking? Authorizing Provider  acyclovir cream (ZOVIRAX) 5 % Apply 1 application topically every 3 (three) hours. 11/22/12   Lonie Peak Dixon, PA-C  calcium-vitamin D (OSCAL WITH D) 500-200 MG-UNIT per tablet Take 1 tablet by mouth daily.     Historical Provider, MD  Cholecalciferol (VITAMIN D) 1000 UNITS capsule Take 2,000 Units by mouth daily.     Historical Provider, MD  citalopram (CELEXA) 20 MG tablet Take 1 tablet (20 mg total) by mouth daily. 08/26/13   Lonie Peak Dixon, PA-C  fish oil-omega-3 fatty acids 1000 MG capsule Take 2 g by mouth daily.    Historical Provider, MD  fluticasone (FLONASE) 50 MCG/ACT nasal spray Place 2 sprays into both nostrils daily. 05/02/13   Lonie Peak Dixon, PA-C  Glucos-MSM-C-Mn-Ginger-Willow (GLUCOSAMINE MSM COMPLEX) TABS Take by mouth.      Historical Provider, MD  Linaclotide Rolan Lipa) 290 MCG CAPS capsule Take 290 mcg by mouth daily.    Historical Provider, MD  loratadine (CLARITIN) 10 MG tablet Take 10 mg by mouth daily.      Historical Provider, MD  Lysine 500 MG TABS Take by mouth daily.    Historical Provider, MD  meloxicam (MOBIC) 7.5 MG tablet Take 1 tablet (7.5 mg total)  by mouth daily. 08/26/13   Lonie Peak Dixon, PA-C  Multiple Vitamins-Minerals (MULTIVITAMIN,TX-MINERALS) tablet Take 1 tablet by mouth daily.      Historical Provider, MD  valACYclovir (VALTREX) 1000 MG tablet TAKE 2 TABLETS BY MOUTH EVERY 12 HOURS FOR 2 DOSES AS NEEDED FOR FLARE 03/23/13   Orlena Sheldon, PA-C  vitamin E 400 UNIT capsule Take 400 Units by mouth daily.      Historical Provider, MD   Physical Exam: Filed Vitals:   09/17/13 1359  BP: 163/84  Pulse: 92  Temp: 99.1 F (37.3 C)  Resp: 20    BP 163/84  Pulse 92  Temp(Src) 99.1 F (37.3 C) (Oral)  Resp 20  Ht 5\' 10"  (1.778 m)  Wt 109.498 kg (241 lb 6.4 oz)  BMI 34.64 kg/m2  SpO2  99%  General:  Appears in mild distress.  Eyes: PERRL, normal lids, irises & conjunctiva Cardiovascular: RRR, no m/r/g. No LE edema. Respiratory: CTA bilaterally, no w/r/r. Normal respiratory effort. Abdomen: soft non distnded, left lower quadrant tenderness . bs+ Skin: no rash or induration seen on limited exam Musculoskeletal: grossly normal tone BUE/BLE Neurologic: grossly non-focal.          Labs on Admission:  Basic Metabolic Panel:  Recent Labs Lab 09/17/13 0440  NA 141  K 3.1*  CL 101  CO2 23  GLUCOSE 131*  BUN 21  CREATININE 0.90  CALCIUM 10.3   Liver Function Tests:  Recent Labs Lab 09/17/13 0440  AST 23  ALT 19  ALKPHOS 71  BILITOT 0.3  PROT 6.9  ALBUMIN 4.2    Recent Labs Lab 09/17/13 0440  LIPASE 45   No results found for this basename: AMMONIA,  in the last 168 hours CBC:  Recent Labs Lab 09/17/13 0440  WBC 6.3  NEUTROABS 4.3  HGB 13.1  HCT 38.2  MCV 88.8  PLT 165   Cardiac Enzymes: No results found for this basename: CKTOTAL, CKMB, CKMBINDEX, TROPONINI,  in the last 168 hours  BNP (last 3 results) No results found for this basename: PROBNP,  in the last 8760 hours CBG: No results found for this basename: GLUCAP,  in the last 168 hours  Radiological Exams on Admission: Ct Abdomen Pelvis W Contrast  09/17/2013   CLINICAL DATA:  Central abdominal pain and nausea. Left lower quadrant pain.  EXAM: CT ABDOMEN AND PELVIS WITH CONTRAST  TECHNIQUE: Multidetector CT imaging of the abdomen and pelvis was performed using the standard protocol following bolus administration of intravenous contrast.  CONTRAST:  173mL OMNIPAQUE IOHEXOL 300 MG/ML SOLN, 75mL OMNIPAQUE IOHEXOL 300 MG/ML SOLN  COMPARISON:  01/04/2011  FINDINGS: Mild dependent atelectasis in the lung bases. Small amount of free fluid around the spleen and tracking along the left pericolic gutter into the pelvis. The spleen appears intact. There is wall thickening in the descending and  transverse colon indicating infectious or inflammatory colitis. Diffusely stool-filled colon without distention. No pneumatosis or portal venous gas.  The liver, spleen, gallbladder, pancreas, adrenal glands, kidneys, abdominal aorta, inferior vena cava, and retroperitoneal lymph nodes are unremarkable. Stomach and small bowel are not abnormally distended. No free air in the abdomen.  Pelvis: No pelvic mass or lymphadenopathy. No bladder wall thickening. Appendix is not identified. No changes suggesting diverticulitis. Degenerative changes in the spine and hips. No destructive bone lesions appreciated. On  IMPRESSION: Stool-filled colon. Wall thickening in the transverse and descending colon suggesting infectious or inflammatory colitis. Small amount of free fluid along the  spleen, left pericolic gutter, and pelvis is likely related to the inflammatory colonic process.   Electronically Signed   By: Lucienne Capers M.D.   On: 09/17/2013 06:33    POE:UMPNTIR.  Assessment/Plan Active Problems:   Hyperlipidemia   Colitis   Hypokalemia  1. Infectious vs inflammatory COLITIS: - admit, and started her on IV ciprofloxacin and IV flagyl.  - hydration, NPO except for ice chips and sips with meds.  - IV pain control and IV aanti emetics.  - stat lactic acid and LDH if elevated will get CT angiogram of the abdomen for evaluation of ischemic colitis.  - no fever or chills or leukocytosis.   2. Hypokalemia  - replete as needed.    3. Hypertension.  - suboptima.  - iv hydralazine prn.   DVT prophylaxis.    Code Status: full code.  Family Communication: none at bedside.  Disposition Plan: admit.  Time spent: 60 minutes.   [facility] Pager [phone]  **Disclaimer: This note may have been dictated with voice recognition software. Similar sounding words can inadvertently be transcribed and this note may contain transcription errors which may not have been corrected upon  publication of note.**

## 2013-09-17 NOTE — Progress Notes (Signed)
Received report on patient from [place], RN at 0800. Awaiting patient arrival.

## 2013-09-17 NOTE — ED Provider Notes (Signed)
CSN: [id number]     Arrival date & time 09/17/13  0406 History   First MD Initiated Contact with Patient 09/17/13 0414     Chief Complaint  Patient presents with  . Abdominal Pain     (Consider location/radiation/quality/duration/timing/severity/associated sxs/prior Treatment) HPI This is a 58 year old female with a history of irritable bowel syndrome. She has a history of episodic paroxysms of pain in her abdomen. She is here with several hours of abdominal pain. The pain is located diffusely in the abdomen but most prominently in the left lower quadrant. The pain is severe and comes in waves. It is worse with movement or palpation. It is somewhat improved by flexing her legs at the hips. She has had waves of nausea but no vomiting. She has not had a fever or diarrhea. It is the most severe abdominal pain she has ever had.  Past Medical History  Diagnosis Date  . Seasonal allergies   . Osteoarthritis   . Depression   . Hyperlipidemia   . Recurrent cold sores   . Syncope and collapse 11/30/2012  . Vasovagal syncope    Past Surgical History  Procedure Laterality Date  . Tonsillectomy     Family History  Problem Relation Age of Onset  . Sudden death Neg Hx   . Hypertension Neg Hx   . Hyperlipidemia Neg Hx   . Heart attack Neg Hx   . Diabetes Neg Hx   . Stroke Mother   . Arthritis Mother   . Cancer Father   . Arthritis Sister   . Cancer Maternal Grandmother   . Stroke Maternal Grandfather   . Cancer Maternal Grandfather   . Cancer Paternal Grandmother   . Heart disease Paternal Grandmother   . Stroke Paternal Grandfather   . Arthritis Sister    History  Substance Use Topics  . Smoking status: Former Research scientist (life sciences)  . Smokeless tobacco: Never Used  . Alcohol Use: Yes     Comment: one drink per week   OB History   Grav Para Term Preterm Abortions TAB SAB Ect Mult Living                 Review of Systems  All other systems reviewed and are negative.   Allergies   Penicillins and Sulfa antibiotics  Home Medications   Prior to Admission medications   Medication Sig Start Date End Date Taking? Authorizing Provider  acyclovir cream (ZOVIRAX) 5 % Apply 1 application topically every 3 (three) hours. 11/22/12   Lonie Peak Dixon, PA-C  calcium-vitamin D (OSCAL WITH D) 500-200 MG-UNIT per tablet Take 1 tablet by mouth daily.     Historical Provider, MD  Cholecalciferol (VITAMIN D) 1000 UNITS capsule Take 2,000 Units by mouth daily.     Historical Provider, MD  citalopram (CELEXA) 20 MG tablet Take 1 tablet (20 mg total) by mouth daily. 08/26/13   Lonie Peak Dixon, PA-C  fish oil-omega-3 fatty acids 1000 MG capsule Take 2 g by mouth daily.    Historical Provider, MD  fluticasone (FLONASE) 50 MCG/ACT nasal spray Place 2 sprays into both nostrils daily. 05/02/13   Lonie Peak Dixon, PA-C  Glucos-MSM-C-Mn-Ginger-Willow (GLUCOSAMINE MSM COMPLEX) TABS Take by mouth.      Historical Provider, MD  Linaclotide Rolan Lipa) 290 MCG CAPS capsule Take 290 mcg by mouth daily.    Historical Provider, MD  loratadine (CLARITIN) 10 MG tablet Take 10 mg by mouth daily.      Historical Provider, MD  Lysine 500  MG TABS Take by mouth daily.    Historical Provider, MD  meloxicam (MOBIC) 7.5 MG tablet Take 1 tablet (7.5 mg total) by mouth daily. 08/26/13   Lonie Peak Dixon, PA-C  Multiple Vitamins-Minerals (MULTIVITAMIN,TX-MINERALS) tablet Take 1 tablet by mouth daily.      Historical Provider, MD  valACYclovir (VALTREX) 1000 MG tablet TAKE 2 TABLETS BY MOUTH EVERY 12 HOURS FOR 2 DOSES AS NEEDED FOR FLARE 03/23/13   Orlena Sheldon, PA-C  vitamin E 400 UNIT capsule Take 400 Units by mouth daily.      Historical Provider, MD   BP 149/65  Pulse 53  Temp(Src) 97.6 F (36.4 C) (Axillary)  Resp 20  Ht 5\' 10"  (1.778 m)  Wt 230 lb (104.327 kg)  BMI 33.00 kg/m2  SpO2 100%  Physical Exam General: Well-developed, well-nourished female; appearance consistent with age of record; moaning in pain HENT:  normocephalic; atraumatic Eyes: pupils equal, round and reactive to light; extraocular muscles intact Neck: supple Heart: regular rate and rhythm Lungs: clear to auscultation bilaterally; hyperventilating Abdomen: soft; nondistended; severe left lower quadrant tenderness; bowel sounds hypoactive Extremities: No deformity; full range of motion; pulses normal Neurologic: Awake, alert; motor function intact in all extremities and symmetric; no facial droop Skin: Warm and dry Psychiatric: Agitated; anxious   ED Course  Procedures (including critical care time)  MDM   Nursing notes and vitals signs, including pulse oximetry, reviewed.  Summary of this visit's results, reviewed by myself:  Labs:  Results for orders placed during the hospital encounter of 09/17/13 (from the past 24 hour(s))  CBC WITH DIFFERENTIAL     Status: None   Collection Time    09/17/13  4:40 AM      Result Value Ref Range   WBC 6.3  4.0 - 10.5 K/uL   RBC 4.30  3.87 - 5.11 MIL/uL   Hemoglobin 13.1  12.0 - 15.0 g/dL   HCT 38.2  36.0 - 46.0 %   MCV 88.8  78.0 - 100.0 fL   MCH 30.5  26.0 - 34.0 pg   MCHC 34.3  30.0 - 36.0 g/dL   RDW 12.2  11.5 - 15.5 %   Platelets 165  150 - 400 K/uL   Neutrophils Relative % 68  43 - 77 %   Neutro Abs 4.3  1.7 - 7.7 K/uL   Lymphocytes Relative 24  12 - 46 %   Lymphs Abs 1.5  0.7 - 4.0 K/uL   Monocytes Relative 8  3 - 12 %   Monocytes Absolute 0.5  0.1 - 1.0 K/uL   Eosinophils Relative 1  0 - 5 %   Eosinophils Absolute 0.0  0.0 - 0.7 K/uL   Basophils Relative 0  0 - 1 %   Basophils Absolute 0.0  0.0 - 0.1 K/uL  COMPREHENSIVE METABOLIC PANEL     Status: Abnormal   Collection Time    09/17/13  4:40 AM      Result Value Ref Range   Sodium 141  137 - 147 mEq/L   Potassium 3.1 (*) 3.7 - 5.3 mEq/L   Chloride 101  96 - 112 mEq/L   CO2 23  19 - 32 mEq/L   Glucose, Bld 131 (*) 70 - 99 mg/dL   BUN 21  6 - 23 mg/dL   Creatinine, Ser 0.90  0.50 - 1.10 mg/dL   Calcium 10.3  8.4  - 10.5 mg/dL   Total Protein 6.9  6.0 - 8.3 g/dL  Albumin 4.2  3.5 - 5.2 g/dL   AST 23  0 - 37 U/L   ALT 19  0 - 35 U/L   Alkaline Phosphatase 71  39 - 117 U/L   Total Bilirubin 0.3  0.3 - 1.2 mg/dL   GFR calc non Af Amer 68 (*) >90 mL/min   GFR calc Af Amer 79 (*) >90 mL/min   Anion gap 17 (*) 5 - 15  LIPASE, BLOOD     Status: None   Collection Time    09/17/13  4:40 AM      Result Value Ref Range   Lipase 45  11 - 59 U/L  URINALYSIS, ROUTINE W REFLEX MICROSCOPIC     Status: Abnormal   Collection Time    09/17/13  5:53 AM      Result Value Ref Range   Color, Urine AMBER (*) YELLOW   APPearance CLEAR  CLEAR   Specific Gravity, Urine 1.011  1.005 - 1.030   pH 7.0  5.0 - 8.0   Glucose, UA NEGATIVE  NEGATIVE mg/dL   Hgb urine dipstick NEGATIVE  NEGATIVE   Bilirubin Urine NEGATIVE  NEGATIVE   Ketones, ur NEGATIVE  NEGATIVE mg/dL   Protein, ur NEGATIVE  NEGATIVE mg/dL   Urobilinogen, UA 1.0  0.0 - 1.0 mg/dL   Nitrite NEGATIVE  NEGATIVE   Leukocytes, UA SMALL (*) NEGATIVE  URINE MICROSCOPIC-ADD ON     Status: Abnormal   Collection Time    09/17/13  5:53 AM      Result Value Ref Range   Squamous Epithelial / LPF FEW (*) RARE   WBC, UA 7-10  <3 WBC/hpf   RBC / HPF 0-2  <3 RBC/hpf   Bacteria, UA FEW (*) RARE    Imaging Studies: Ct Abdomen Pelvis W Contrast  09/17/2013   CLINICAL DATA:  Central abdominal pain and nausea. Left lower quadrant pain.  EXAM: CT ABDOMEN AND PELVIS WITH CONTRAST  TECHNIQUE: Multidetector CT imaging of the abdomen and pelvis was performed using the standard protocol following bolus administration of intravenous contrast.  CONTRAST:  191mL OMNIPAQUE IOHEXOL 300 MG/ML SOLN, 48mL OMNIPAQUE IOHEXOL 300 MG/ML SOLN  COMPARISON:  01/04/2011  FINDINGS: Mild dependent atelectasis in the lung bases. Small amount of free fluid around the spleen and tracking along the left pericolic gutter into the pelvis. The spleen appears intact. There is wall thickening in the  descending and transverse colon indicating infectious or inflammatory colitis. Diffusely stool-filled colon without distention. No pneumatosis or portal venous gas.  The liver, spleen, gallbladder, pancreas, adrenal glands, kidneys, abdominal aorta, inferior vena cava, and retroperitoneal lymph nodes are unremarkable. Stomach and small bowel are not abnormally distended. No free air in the abdomen.  Pelvis: No pelvic mass or lymphadenopathy. No bladder wall thickening. Appendix is not identified. No changes suggesting diverticulitis. Degenerative changes in the spine and hips. No destructive bone lesions appreciated. On  IMPRESSION: Stool-filled colon. Wall thickening in the transverse and descending colon suggesting infectious or inflammatory colitis. Small amount of free fluid along the spleen, left pericolic gutter, and pelvis is likely related to the inflammatory colonic process.   Electronically Signed   By: Lucienne Capers M.D.   On: 09/17/2013 06:33   6:50 AM Ciprofloxacin and Flagyl IV ordered for colitis.      Wynetta Fines, MD 09/17/13 [phone]

## 2013-09-18 DIAGNOSIS — K5900 Constipation, unspecified: Secondary | ICD-10-CM

## 2013-09-18 LAB — COMPREHENSIVE METABOLIC PANEL
ALT: 13 U/L (ref 0–35)
ANION GAP: 12 (ref 5–15)
AST: 12 U/L (ref 0–37)
Albumin: 3 g/dL — ABNORMAL LOW (ref 3.5–5.2)
Alkaline Phosphatase: 50 U/L (ref 39–117)
BUN: 22 mg/dL (ref 6–23)
CHLORIDE: 104 meq/L (ref 96–112)
CO2: 25 meq/L (ref 19–32)
CREATININE: 0.86 mg/dL (ref 0.50–1.10)
Calcium: 8.5 mg/dL (ref 8.4–10.5)
GFR calc Af Amer: 83 mL/min — ABNORMAL LOW (ref >90)
GFR, EST NON AFRICAN AMERICAN: 72 mL/min — AB (ref >90)
Glucose, Bld: 112 mg/dL — ABNORMAL HIGH (ref 70–99)
Potassium: 4.2 mEq/L (ref 3.7–5.3)
Sodium: 141 mEq/L (ref 137–147)
Total Bilirubin: 0.7 mg/dL (ref 0.3–1.2)
Total Protein: 5.7 g/dL — ABNORMAL LOW (ref 6.0–8.3)

## 2013-09-18 LAB — CBC
HCT: 32.8 % — ABNORMAL LOW (ref 36.0–46.0)
Hemoglobin: 10.8 g/dL — ABNORMAL LOW (ref 12.0–15.0)
MCH: 29.8 pg (ref 26.0–34.0)
MCHC: 32.9 g/dL (ref 30.0–36.0)
MCV: 90.4 fL (ref 78.0–100.0)
PLATELETS: 147 10*3/uL — AB (ref 150–400)
RBC: 3.63 MIL/uL — ABNORMAL LOW (ref 3.87–5.11)
RDW: 13.2 % (ref 11.5–15.5)
WBC: 8.8 10*3/uL (ref 4.0–10.5)

## 2013-09-18 LAB — LIPID PANEL
CHOLESTEROL: 132 mg/dL (ref 0–200)
HDL: 62 mg/dL (ref >39)
LDL Cholesterol: 57 mg/dL (ref 0–99)
Total CHOL/HDL Ratio: 2.1 RATIO
Triglycerides: 66 mg/dL (ref <150)
VLDL: 13 mg/dL (ref 0–40)

## 2013-09-18 LAB — CLOSTRIDIUM DIFFICILE BY PCR: Toxigenic C. Difficile by PCR: NEGATIVE

## 2013-09-18 LAB — URINE CULTURE

## 2013-09-18 MED ORDER — SORBITOL 70 % SOLN
960.0000 mL | TOPICAL_OIL | Freq: Once | ORAL | Status: AC
  Administered 2013-09-18: 960 mL via RECTAL
  Filled 2013-09-18: qty 240

## 2013-09-18 MED ORDER — BISACODYL 10 MG RE SUPP
10.0000 mg | Freq: Once | RECTAL | Status: AC
  Administered 2013-09-18: 10 mg via RECTAL
  Filled 2013-09-18: qty 1

## 2013-09-18 MED ORDER — SORBITOL 70 % SOLN
30.0000 mL | Status: AC
  Administered 2013-09-18: 30 mL via ORAL
  Filled 2013-09-18: qty 30

## 2013-09-18 MED ORDER — ACETAMINOPHEN 325 MG PO TABS
650.0000 mg | ORAL_TABLET | Freq: Four times a day (QID) | ORAL | Status: DC | PRN
  Administered 2013-09-18 – 2013-09-19 (×2): 650 mg via ORAL
  Filled 2013-09-18 (×2): qty 2

## 2013-09-18 MED ORDER — KETOROLAC TROMETHAMINE 15 MG/ML IJ SOLN
15.0000 mg | Freq: Four times a day (QID) | INTRAMUSCULAR | Status: DC | PRN
  Administered 2013-09-18 – 2013-09-19 (×2): 15 mg via INTRAVENOUS
  Filled 2013-09-18 (×2): qty 1

## 2013-09-18 MED ORDER — SORBITOL 70 % SOLN: 960.0000 mL | TOPICAL_OIL | ORAL | Status: DC

## 2013-09-18 MED ORDER — MAGNESIUM CITRATE PO SOLN
1.0000 | ORAL | Status: AC
  Administered 2013-09-18: 1 via ORAL
  Filled 2013-09-18: qty 296

## 2013-09-18 NOTE — Progress Notes (Signed)
[facility] PROGRESS NOTE  Monique Gonzalez [id number]:[id number] DOB: 1955/07/07 DOA: 09/17/2013 PCP: Odette Fraction, MD  Assessment/Plan: 1. Infectious vs inflammatory COLITIS:  - started her on IV ciprofloxacin and IV flagyl.  - hydration, NPO except for ice chips and sips with meds.  - IV pain control and IV anti emetics.  - no fever or chills or leukocytosis.  2. Hypokalemia  - replete as needed.  3. Hypertension.  - suboptima.  - iv hydralazine prn.  4. Constipation - Marked amounts of stool seen on CT - Will cont with enemas and cathartics PRN  Code Status: Full Family Communication: Pt in room (indicate person spoken with, relationship, and if by phone, the number) Disposition Plan: Pending   Consultants:    Procedures:    Antibiotics:  Cipro  Flagyl  HPI/Subjective: Complains of generalized abd pain with nausea and decreased appetite.  Objective: Filed Vitals:   09/17/13 2102 09/18/13 0455 09/18/13 0511 09/18/13 0638  BP: 140/80 131/46    Pulse: 78 88    Temp: 99 F (37.2 C) 101.2 F (38.4 C) 99.7 F (37.6 C) 98.7 F (37.1 C)  TempSrc: Oral Oral Oral Oral  Resp: 18 18    Height:      Weight:      SpO2: 99% 98%      Intake/Output Summary (Last 24 hours) at 09/18/13 1257 Last data filed at 09/18/13 5093  Gross per 24 hour  Intake 1923.75 ml  Output      0 ml  Net 1923.75 ml   Filed Weights   09/17/13 0413 09/17/13 0914  Weight: 230 lb (104.327 kg) 241 lb 6.4 oz (109.498 kg)    Exam:   General:  Awake, in nad  Cardiovascular: regular, s1, s2  Respiratory: normal resp effort, no wheezing  Abdomen: soft, nondistended  Musculoskeletal: perfused, no clubbing   Data Reviewed: Basic Metabolic Panel:  Recent Labs Lab 09/17/13 0440 09/18/13 0715  NA 141 141  K 3.1* 4.2  CL 101 104  CO2 23 25  GLUCOSE 131* 112*  BUN 21 22  CREATININE 0.90 0.86  CALCIUM 10.3 8.5   Liver Function Tests:  Recent Labs Lab 09/17/13 0440  09/18/13 0715  AST 23 12  ALT 19 13  ALKPHOS 71 50  BILITOT 0.3 0.7  PROT 6.9 5.7*  ALBUMIN 4.2 3.0*    Recent Labs Lab 09/17/13 0440  LIPASE 45   No results found for this basename: AMMONIA,  in the last 168 hours CBC:  Recent Labs Lab 09/17/13 0440 09/18/13 0715  WBC 6.3 8.8  NEUTROABS 4.3  --   HGB 13.1 10.8*  HCT 38.2 32.8*  MCV 88.8 90.4  PLT 165 147*   Cardiac Enzymes: No results found for this basename: CKTOTAL, CKMB, CKMBINDEX, TROPONINI,  in the last 168 hours BNP (last 3 results) No results found for this basename: PROBNP,  in the last 8760 hours CBG: No results found for this basename: GLUCAP,  in the last 168 hours  Recent Results (from the past 240 hour(s))  CLOSTRIDIUM DIFFICILE BY PCR     Status: None   Collection Time    09/18/13  4:30 AM      Result Value Ref Range Status   C difficile by pcr NEGATIVE  NEGATIVE Final     Studies: Ct Abdomen Pelvis W Contrast  09/17/2013   CLINICAL DATA:  Central abdominal pain and nausea. Left lower quadrant pain.  EXAM: CT ABDOMEN AND PELVIS WITH CONTRAST  TECHNIQUE:  Multidetector CT imaging of the abdomen and pelvis was performed using the standard protocol following bolus administration of intravenous contrast.  CONTRAST:  175mL OMNIPAQUE IOHEXOL 300 MG/ML SOLN, 45mL OMNIPAQUE IOHEXOL 300 MG/ML SOLN  COMPARISON:  01/04/2011  FINDINGS: Mild dependent atelectasis in the lung bases. Small amount of free fluid around the spleen and tracking along the left pericolic gutter into the pelvis. The spleen appears intact. There is wall thickening in the descending and transverse colon indicating infectious or inflammatory colitis. Diffusely stool-filled colon without distention. No pneumatosis or portal venous gas.  The liver, spleen, gallbladder, pancreas, adrenal glands, kidneys, abdominal aorta, inferior vena cava, and retroperitoneal lymph nodes are unremarkable. Stomach and small bowel are not abnormally distended. No free  air in the abdomen.  Pelvis: No pelvic mass or lymphadenopathy. No bladder wall thickening. Appendix is not identified. No changes suggesting diverticulitis. Degenerative changes in the spine and hips. No destructive bone lesions appreciated. On  IMPRESSION: Stool-filled colon. Wall thickening in the transverse and descending colon suggesting infectious or inflammatory colitis. Small amount of free fluid along the spleen, left pericolic gutter, and pelvis is likely related to the inflammatory colonic process.   Electronically Signed   By: Lucienne Capers M.D.   On: 09/17/2013 06:33   Ct Angio Abd/pel W/ And/or W/o  09/18/2013   CLINICAL DATA:  Evaluate for ischemic colitis  EXAM: CT ANGIOGRAPHY ABDOMEN AND PELVIS WITH CONTRAST  TECHNIQUE: Multidetector CT imaging of the abdomen and pelvis was performed using the standard protocol during bolus administration of intravenous contrast. Multiplanar reconstructed images including MIPs were obtained and reviewed to evaluate the vascular anatomy.  CONTRAST:  126mL OMNIPAQUE IOHEXOL 350 MG/ML SOLN  COMPARISON:  CT abdomen pelvis-earlier same day; 01/04/2011  FINDINGS: Vascular Findings:  Abdominal aorta: Scattered very minimal mixed calcified and noncalcified atherosclerotic plaque within a normal caliber abdominal aorta. No abdominal aortic dissection or periaortic stranding.  Celiac artery: There is a minimal amount of eccentric calcified plaque involving the cranial aspect of the origin the celiac artery, not resulting in hemodynamically significant stenosis. Conventional branching pattern.  SMA: There is a very minimal amount of eccentric calcified plaque involving the cranial aspect of the origin of the SMA, not resulting in a hemodynamically significant stenosis. The distal tributaries of the SMA appear widely patent and are without discrete intraluminal filling defect to suggest distal embolism.  Right Renal artery: Solitary; widely patent without hemodynamically  significant stenosis.  Left Renal artery: Solitary; widely patent without hemodynamically significant stenosis.  IMA: Widely patent without hemodynamically significant stenosis. The distal tributaries of the IMA are widely patent without discrete intraluminal filling defect to suggest distal embolism.  Pelvic vasculature: The bilateral common, external and internal iliac arteries are of normal caliber and widely patent without hemodynamically significant stenosis.  Review of the MIP images confirms the above findings.   --------------------------------------------------------------------------------  Nonvascular Findings:  Grossly unchanged mild though rather diffuse bowel wall thickening primarily involving the splenic flexure of the colon (representative axial images 96 and 170, series 4, coronal images 29 and 53, series 8). These findings are again associated with a small amounts of adjacent mesenteric stranding and fluid which tracks throughout the left pericolic gutter and into the lower pelvis. No definable/drainable fluid collection. No pneumoperitoneum, pneumatosis or portal venous gas.  The bowel is otherwise normal in course and caliber. Normal appearance of the retrocecal appendix. Moderate colonic stool burden without evidence of obstruction.  Normal hepatic contour. There is a very minimal amount  of focal fatty infiltration adjacent to the fissure for ligamentum teres. No discrete hepatic lesions. There is a minimal amount of vicarious excretion of contrast seen within an otherwise normal-appearing gallbladder. No gallbladder wall thickening or pericholecystic fluid. No intra or extrahepatic biliary duct dilatation. No ascites.  There is symmetric enhancement and excretion of the bilateral kidneys. No definite renal stones on this postcontrast examination. No discrete renal lesions. No urinary obstruction or perinephric stranding. There is mild thickening of the medial limb of the left adrenal gland  without discrete nodule. Normal appearance of the right adrenal gland, pancreas and spleen. There is a minimal amount of perisplenic fluid.  No retroperitoneal, mesenteric, pelvic or inguinal lymphadenopathy.  Normal appearance of the pelvic organs for age. No discrete adnexal lesion.  Limited visualization of the lower thorax demonstrates minimal subsegmental atelectasis within the imaged bilateral lower lobes as well as the inferior segment of the lingula. No discrete focal airspace opacities. No pleural effusion.  Borderline cardiomegaly. Trace amount of pericardial fluid, presumably physiologic.  No acute or aggressive osseus abnormalities. Mild to moderate multilevel lumbar spine DDD, worse at L5-S1 with disc space height loss, endplate irregularity and sclerosis. Regional soft tissues appear normal.  IMPRESSION: 1. Grossly unchanged mild though rather diffuse wall thickening extending from the transverse colon to the mid descending colon. There are no discrete intraluminal filling defects within the mesenteric vasculature to suggest embolism as a source of mesenteric ischemia, though note, the distribution of colonic wall thickening could be seen in the setting of a watershed ischemic episode in the setting of profound hypotension. Clinical correlation is advised. In the absence of this clinical history, the colonic wall thickening is favored to be of either infectious and/or inflammatory etiology. 2. Grossly unchanged mesenteric stranding and fluid within the left pericolic gutter extending to the lower pelvis without definable/drainable fluid collection.   Electronically Signed   By: Sandi Mariscal M.D.   On: 09/18/2013 08:12    Scheduled Meds: . antiseptic oral rinse  15 mL Mouth Rinse q12n4p  . chlorhexidine  15 mL Mouth Rinse BID  . ciprofloxacin  400 mg Intravenous Q12H  . citalopram  20 mg Oral Daily  . enoxaparin (LOVENOX) injection  40 mg Subcutaneous Q24H  . fluticasone  2 spray Each Nare Daily   . metronidazole  500 mg Intravenous Q8H   Continuous Infusions: . sodium chloride 75 mL/hr at 09/18/13 3086    Active Problems:   Hyperlipidemia   Colitis   Hypokalemia  Time spent: 78min  Andrya Roppolo, [facility] Pager [phone]. If 7PM-7AM, please contact night-coverage at [email or web address], password [facility] 09/18/2013, 12:57 PM  LOS: 1 day

## 2013-09-18 NOTE — Progress Notes (Addendum)
Notified Lynch, NP that patient's temp is 101.2. NP ordered Tylenol prn for fever and blood culture. Will continue to monitor pt. Ranelle Oyster, RN

## 2013-09-18 NOTE — Progress Notes (Signed)
Gave soap suds enema and sorbitol. Patient had BM after enema, but it was hard and fairly small in size. Will continue to monitor for more BMs.

## 2013-09-19 DIAGNOSIS — R55 Syncope and collapse: Secondary | ICD-10-CM

## 2013-09-19 LAB — GI PATHOGEN PANEL BY PCR, STOOL
C DIFFICILE TOXIN A/B: NEGATIVE
CAMPYLOBACTER BY PCR: NEGATIVE
Cryptosporidium by PCR: NEGATIVE
E coli (ETEC) LT/ST: NEGATIVE
E coli (STEC): NEGATIVE
E coli 0157 by PCR: NEGATIVE
G LAMBLIA BY PCR: NEGATIVE
NOROVIRUS G1/G2: NEGATIVE
Rotavirus A by PCR: NEGATIVE
Salmonella by PCR: NEGATIVE
Shigella by PCR: NEGATIVE

## 2013-09-19 MED ORDER — BISACODYL 10 MG RE SUPP
10.0000 mg | Freq: Once | RECTAL | Status: AC
  Administered 2013-09-19: 10 mg via RECTAL
  Filled 2013-09-19: qty 1

## 2013-09-19 NOTE — Progress Notes (Signed)
[facility] PROGRESS NOTE  Monique Gonzalez [id number]:[id number] DOB: 1955/07/19 DOA: 09/17/2013 PCP: Odette Fraction, MD  Assessment/Plan: 1. Infectious vs inflammatory COLITIS:  - was started on IV ciprofloxacin and IV flagyl.  - hydration, NPO except for ice chips and sips with meds.  - IV pain control and IV anti emetics.  - no fever or chills or leukocytosis.  2. Hypokalemia  - replete as needed  3. Hypertension.  - Stable - iv hydralazine prn for now 4. Constipation - Marked amounts of stool seen on CT with good bm noted since starting bowel regimen - Pt continues with abd fullness - Will cont with enemas and cathartics PRN  Code Status: Full Family Communication: Pt in room Disposition Plan: Pending   Consultants:    Procedures:    Antibiotics:  Cipro  Flagyl  HPI/Subjective: Cont with generalized abd pain, overall improved  Objective: Filed Vitals:   09/18/13 1318 09/18/13 2155 09/19/13 0615 09/19/13 1424  BP: 125/76 143/76 133/77 154/74  Pulse: 80 87 85 81  Temp: 99.1 F (37.3 C) 99.3 F (37.4 C) 98.6 F (37 C) 99.6 F (37.6 C)  TempSrc: Oral Oral Oral Oral  Resp: 18 16 16 16   Height:      Weight:      SpO2: 94% 95% 96% 98%    Intake/Output Summary (Last 24 hours) at 09/19/13 1518 Last data filed at 09/18/13 2300  Gross per 24 hour  Intake 1582.5 ml  Output      0 ml  Net 1582.5 ml   Filed Weights   09/17/13 0413 09/17/13 0914  Weight: 230 lb (104.327 kg) 241 lb 6.4 oz (109.498 kg)    Exam:   General:  Awake, in nad  Cardiovascular: regular, s1, s2  Respiratory: normal resp effort, no wheezing  Abdomen: soft, nondistended  Musculoskeletal: perfused, no clubbing   Data Reviewed: Basic Metabolic Panel:  Recent Labs Lab 09/17/13 0440 09/18/13 0715  NA 141 141  K 3.1* 4.2  CL 101 104  CO2 23 25  GLUCOSE 131* 112*  BUN 21 22  CREATININE 0.90 0.86  CALCIUM 10.3 8.5   Liver Function Tests:  Recent Labs Lab  09/17/13 0440 09/18/13 0715  AST 23 12  ALT 19 13  ALKPHOS 71 50  BILITOT 0.3 0.7  PROT 6.9 5.7*  ALBUMIN 4.2 3.0*    Recent Labs Lab 09/17/13 0440  LIPASE 45   No results found for this basename: AMMONIA,  in the last 168 hours CBC:  Recent Labs Lab 09/17/13 0440 09/18/13 0715  WBC 6.3 8.8  NEUTROABS 4.3  --   HGB 13.1 10.8*  HCT 38.2 32.8*  MCV 88.8 90.4  PLT 165 147*   Cardiac Enzymes: No results found for this basename: CKTOTAL, CKMB, CKMBINDEX, TROPONINI,  in the last 168 hours BNP (last 3 results) No results found for this basename: PROBNP,  in the last 8760 hours CBG: No results found for this basename: GLUCAP,  in the last 168 hours  Recent Results (from the past 240 hour(s))  URINE CULTURE     Status: None   Collection Time    09/17/13  5:53 AM      Result Value Ref Range Status   Specimen Description URINE, CLEAN CATCH   Final   Special Requests NONE   Final   Culture  Setup Time     Final   Value: 09/17/2013 22:59     Performed at [facility]  Final   Value: 2,000 COLONIES/ML     Performed at [facility]   Culture     Final   Value: INSIGNIFICANT GROWTH     Performed at [facility]   Report Status 09/18/2013 FINAL   Final  STOOL CULTURE     Status: None   Collection Time    09/18/13  4:30 AM      Result Value Ref Range Status   Specimen Description STOOL   Final   Special Requests NONE   Final   Culture     Final   Value: Culture reincubated for better growth     Performed at [facility]   Report Status PENDING   Incomplete  CLOSTRIDIUM DIFFICILE BY PCR     Status: None   Collection Time    09/18/13  4:30 AM      Result Value Ref Range Status   C difficile by pcr NEGATIVE  NEGATIVE Final  CULTURE, BLOOD (SINGLE)     Status: None   Collection Time    09/18/13  7:15 AM      Result Value Ref Range Status   Specimen Description BLOOD RIGHT ARM   Final   Special Requests BOTTLES DRAWN  AEROBIC ONLY 10CC   Final   Culture  Setup Time     Final   Value: 09/18/2013 18:44     Performed at [facility]   Culture     Final   Value:        BLOOD CULTURE RECEIVED NO GROWTH TO DATE CULTURE WILL BE HELD FOR 5 DAYS BEFORE ISSUING A FINAL NEGATIVE REPORT     Performed at [facility]   Report Status PENDING   Incomplete  CULTURE, BLOOD (ROUTINE X 2)     Status: None   Collection Time    09/18/13  7:20 AM      Result Value Ref Range Status   Specimen Description BLOOD RIGHT ARM   Final   Special Requests BOTTLES DRAWN AEROBIC ONLY 5CC   Final   Culture  Setup Time     Final   Value: 09/18/2013 18:44     Performed at [facility]   Culture     Final   Value:        BLOOD CULTURE RECEIVED NO GROWTH TO DATE CULTURE WILL BE HELD FOR 5 DAYS BEFORE ISSUING A FINAL NEGATIVE REPORT     Performed at [facility]   Report Status PENDING   Incomplete     Studies: Ct Angio Abd/pel W/ And/or W/o  09/18/2013   CLINICAL DATA:  Evaluate for ischemic colitis  EXAM: CT ANGIOGRAPHY ABDOMEN AND PELVIS WITH CONTRAST  TECHNIQUE: Multidetector CT imaging of the abdomen and pelvis was performed using the standard protocol during bolus administration of intravenous contrast. Multiplanar reconstructed images including MIPs were obtained and reviewed to evaluate the vascular anatomy.  CONTRAST:  121mL OMNIPAQUE IOHEXOL 350 MG/ML SOLN  COMPARISON:  CT abdomen pelvis-earlier same day; 01/04/2011  FINDINGS: Vascular Findings:  Abdominal aorta: Scattered very minimal mixed calcified and noncalcified atherosclerotic plaque within a normal caliber abdominal aorta. No abdominal aortic dissection or periaortic stranding.  Celiac artery: There is a minimal amount of eccentric calcified plaque involving the cranial aspect of the origin the celiac artery, not resulting in hemodynamically significant stenosis. Conventional branching pattern.  SMA: There is a very minimal amount of eccentric  calcified plaque involving the cranial aspect of the  origin of the SMA, not resulting in a hemodynamically significant stenosis. The distal tributaries of the SMA appear widely patent and are without discrete intraluminal filling defect to suggest distal embolism.  Right Renal artery: Solitary; widely patent without hemodynamically significant stenosis.  Left Renal artery: Solitary; widely patent without hemodynamically significant stenosis.  IMA: Widely patent without hemodynamically significant stenosis. The distal tributaries of the IMA are widely patent without discrete intraluminal filling defect to suggest distal embolism.  Pelvic vasculature: The bilateral common, external and internal iliac arteries are of normal caliber and widely patent without hemodynamically significant stenosis.  Review of the MIP images confirms the above findings.   --------------------------------------------------------------------------------  Nonvascular Findings:  Grossly unchanged mild though rather diffuse bowel wall thickening primarily involving the splenic flexure of the colon (representative axial images 96 and 170, series 4, coronal images 29 and 53, series 8). These findings are again associated with a small amounts of adjacent mesenteric stranding and fluid which tracks throughout the left pericolic gutter and into the lower pelvis. No definable/drainable fluid collection. No pneumoperitoneum, pneumatosis or portal venous gas.  The bowel is otherwise normal in course and caliber. Normal appearance of the retrocecal appendix. Moderate colonic stool burden without evidence of obstruction.  Normal hepatic contour. There is a very minimal amount of focal fatty infiltration adjacent to the fissure for ligamentum teres. No discrete hepatic lesions. There is a minimal amount of vicarious excretion of contrast seen within an otherwise normal-appearing gallbladder. No gallbladder wall thickening or pericholecystic fluid. No intra  or extrahepatic biliary duct dilatation. No ascites.  There is symmetric enhancement and excretion of the bilateral kidneys. No definite renal stones on this postcontrast examination. No discrete renal lesions. No urinary obstruction or perinephric stranding. There is mild thickening of the medial limb of the left adrenal gland without discrete nodule. Normal appearance of the right adrenal gland, pancreas and spleen. There is a minimal amount of perisplenic fluid.  No retroperitoneal, mesenteric, pelvic or inguinal lymphadenopathy.  Normal appearance of the pelvic organs for age. No discrete adnexal lesion.  Limited visualization of the lower thorax demonstrates minimal subsegmental atelectasis within the imaged bilateral lower lobes as well as the inferior segment of the lingula. No discrete focal airspace opacities. No pleural effusion.  Borderline cardiomegaly. Trace amount of pericardial fluid, presumably physiologic.  No acute or aggressive osseus abnormalities. Mild to moderate multilevel lumbar spine DDD, worse at L5-S1 with disc space height loss, endplate irregularity and sclerosis. Regional soft tissues appear normal.  IMPRESSION: 1. Grossly unchanged mild though rather diffuse wall thickening extending from the transverse colon to the mid descending colon. There are no discrete intraluminal filling defects within the mesenteric vasculature to suggest embolism as a source of mesenteric ischemia, though note, the distribution of colonic wall thickening could be seen in the setting of a watershed ischemic episode in the setting of profound hypotension. Clinical correlation is advised. In the absence of this clinical history, the colonic wall thickening is favored to be of either infectious and/or inflammatory etiology. 2. Grossly unchanged mesenteric stranding and fluid within the left pericolic gutter extending to the lower pelvis without definable/drainable fluid collection.   Electronically Signed   By:  Sandi Mariscal M.D.   On: 09/18/2013 08:12    Scheduled Meds: . ciprofloxacin  400 mg Intravenous Q12H  . citalopram  20 mg Oral Daily  . enoxaparin (LOVENOX) injection  40 mg Subcutaneous Q24H  . fluticasone  2 spray Each Nare Daily  . metronidazole  500 mg Intravenous Q8H   Continuous Infusions: . sodium chloride 75 mL/hr at 09/19/13 1320    Active Problems:   Hyperlipidemia   Colitis   Hypokalemia  Time spent: 17min  CH[facility], [facility] Pager [phone]. If 7PM-7AM, please contact night-coverage at [email or web address], password [facility] 09/19/2013, 3:18 PM  LOS: 2 days

## 2013-09-20 DIAGNOSIS — K5900 Constipation, unspecified: Secondary | ICD-10-CM

## 2013-09-20 HISTORY — DX: Constipation, unspecified: K59.00

## 2013-09-20 MED ORDER — POLYETHYLENE GLYCOL 3350 17 GM/SCOOP PO POWD: 1.0000 | Freq: Once | ORAL | Status: DC

## 2013-09-20 MED ORDER — POLYETHYLENE GLYCOL 3350 17 G PO PACK: 17.0000 g | PACK | Freq: Every day | ORAL | Status: DC

## 2013-09-20 MED ORDER — DOCUSATE SODIUM 100 MG PO CAPS: 100.0000 mg | ORAL_CAPSULE | Freq: Two times a day (BID) | ORAL | Status: DC

## 2013-09-20 NOTE — Discharge Summary (Signed)
Physician Discharge Summary  Monique Gonzalez HGD:[id number] DOB: August 13, 1955 DOA: 09/17/2013  PCP: Odette Fraction, MD  Admit date: 09/17/2013 Discharge date: 09/20/2013  Time spent: 35 minutes  Recommendations for Outpatient Follow-up:  1. Follow up with PCP in 1-2 2. If constipation continues, would consider outpatient GI referral  Discharge Diagnoses:  Principal Problem:   Constipation Active Problems:   Hyperlipidemia   Colitis   Hypokalemia  Discharge Condition: Improved  Diet recommendation: High fiber  Filed Weights   09/17/13 0413 09/17/13 0914  Weight: 230 lb (104.327 kg) 241 lb 6.4 oz (109.498 kg)   History of present illness:  See admit h and p from 7/11 for details. Briefly, pt presented with abd pain, nausea and vomiting. Pt was admitted to the hospitalist service for further workup.  Hospital Course:  1. Infectious vs inflammatory COLITIS:  - was started empirically on IV ciprofloxacin and IV flagyl.  - Pt was continued on hydration and NPO with diet eventually advanced - Pt was continued on pain meds and anti-emetics.  - Pt remained without fevers or chills or leukocytosis.  - CT findings likely secondary to marked constipation 2. Hypokalemia  - replete as needed  3. Hypertension.  - Remained stable during this hospital stay 4. Constipation  - Marked amounts of stool seen on CT with good bm noted since starting bowel regimen - likely cause of symptoms and likely source of bowel wall thickening on CT - Multiple stools with enemas and cathartics PRN  Consultations:  none  Discharge Exam: Filed Vitals:   09/19/13 1424 09/19/13 2100 09/20/13 0532 09/20/13 1414  BP: 154/74 136/80 132/64 148/74  Pulse: 81 82 74 71  Temp: 99.6 F (37.6 C) 99.3 F (37.4 C) 99.4 F (37.4 C) 99.5 F (37.5 C)  TempSrc: Oral Oral Oral Oral  Resp: 16 16 18 16   Height:      Weight:      SpO2: 98% 96% 96% 99%    General: Awake, in nad Cardiovascular: regular, s1,  s2 Respiratory: normal resp effort, no wheezing  Discharge Instructions     Medication List         acyclovir cream 5 %  Commonly known as:  ZOVIRAX  Apply 1 application topically every 3 (three) hours.     calcium-vitamin D 500-200 MG-UNIT per tablet  Commonly known as:  OSCAL WITH D  Take 1 tablet by mouth daily.     citalopram 20 MG tablet  Commonly known as:  CELEXA  Take 1 tablet (20 mg total) by mouth daily.     docusate sodium 100 MG capsule  Commonly known as:  COLACE  Take 1 capsule (100 mg total) by mouth 2 (two) times daily.     fish oil-omega-3 fatty acids 1000 MG capsule  Take 2 g by mouth daily.     fluticasone 50 MCG/ACT nasal spray  Commonly known as:  FLONASE  Place 2 sprays into both nostrils daily.     GLUCOSAMINE MSM COMPLEX Tabs  Take 1 tablet by mouth daily.     LINZESS 290 MCG Caps capsule  Generic drug:  Linaclotide  Take 290 mcg by mouth daily.     loratadine 10 MG tablet  Commonly known as:  CLARITIN  Take 10 mg by mouth daily.     Lysine 500 MG Tabs  Take 1 tablet by mouth daily.     meloxicam 7.5 MG tablet  Commonly known as:  MOBIC  Take 1 tablet (7.5 mg total) by  mouth daily.     multivitamin,tx-minerals tablet  Take 1 tablet by mouth daily.     polyethylene glycol powder powder  Commonly known as:  MIRALAX  Take 255 g (1 Container total) by mouth once.     valACYclovir 1000 MG tablet  Commonly known as:  VALTREX  Take 2,000 mg by mouth 2 (two) times daily as needed (for 2 doses for flare ups).     Vitamin D 1000 UNITS capsule  Take 2,000 Units by mouth daily.     vitamin E 400 UNIT capsule  Take 400 Units by mouth daily.       Allergies  Allergen Reactions  . Penicillins Rash  . Sulfa Antibiotics Rash   Follow-up Information   Follow up with [facility] TOM, MD. Schedule an appointment as soon as possible for a visit in 1 week.   Specialty:  Family Medicine   Contact information:   [address]  [place] [address] [phone]        The results of significant diagnostics from this hospitalization (including imaging, microbiology, ancillary and laboratory) are listed below for reference.    Significant Diagnostic Studies: Ct Abdomen Pelvis W Contrast  09/17/2013   CLINICAL DATA:  Central abdominal pain and nausea. Left lower quadrant pain.  EXAM: CT ABDOMEN AND PELVIS WITH CONTRAST  TECHNIQUE: Multidetector CT imaging of the abdomen and pelvis was performed using the standard protocol following bolus administration of intravenous contrast.  CONTRAST:  141mL OMNIPAQUE IOHEXOL 300 MG/ML SOLN, 73mL OMNIPAQUE IOHEXOL 300 MG/ML SOLN  COMPARISON:  01/04/2011  FINDINGS: Mild dependent atelectasis in the lung bases. Small amount of free fluid around the spleen and tracking along the left pericolic gutter into the pelvis. The spleen appears intact. There is wall thickening in the descending and transverse colon indicating infectious or inflammatory colitis. Diffusely stool-filled colon without distention. No pneumatosis or portal venous gas.  The liver, spleen, gallbladder, pancreas, adrenal glands, kidneys, abdominal aorta, inferior vena cava, and retroperitoneal lymph nodes are unremarkable. Stomach and small bowel are not abnormally distended. No free air in the abdomen.  Pelvis: No pelvic mass or lymphadenopathy. No bladder wall thickening. Appendix is not identified. No changes suggesting diverticulitis. Degenerative changes in the spine and hips. No destructive bone lesions appreciated. On  IMPRESSION: Stool-filled colon. Wall thickening in the transverse and descending colon suggesting infectious or inflammatory colitis. Small amount of free fluid along the spleen, left pericolic gutter, and pelvis is likely related to the inflammatory colonic process.   Electronically Signed   By: Lucienne Capers M.D.   On: 09/17/2013 06:33   Ct Angio Abd/pel W/ And/or W/o  09/18/2013   CLINICAL  DATA:  Evaluate for ischemic colitis  EXAM: CT ANGIOGRAPHY ABDOMEN AND PELVIS WITH CONTRAST  TECHNIQUE: Multidetector CT imaging of the abdomen and pelvis was performed using the standard protocol during bolus administration of intravenous contrast. Multiplanar reconstructed images including MIPs were obtained and reviewed to evaluate the vascular anatomy.  CONTRAST:  115mL OMNIPAQUE IOHEXOL 350 MG/ML SOLN  COMPARISON:  CT abdomen pelvis-earlier same day; 01/04/2011  FINDINGS: Vascular Findings:  Abdominal aorta: Scattered very minimal mixed calcified and noncalcified atherosclerotic plaque within a normal caliber abdominal aorta. No abdominal aortic dissection or periaortic stranding.  Celiac artery: There is a minimal amount of eccentric calcified plaque involving the cranial aspect of the origin the celiac artery, not resulting in hemodynamically significant stenosis. Conventional branching pattern.  SMA: There is a very minimal amount  of eccentric calcified plaque involving the cranial aspect of the origin of the SMA, not resulting in a hemodynamically significant stenosis. The distal tributaries of the SMA appear widely patent and are without discrete intraluminal filling defect to suggest distal embolism.  Right Renal artery: Solitary; widely patent without hemodynamically significant stenosis.  Left Renal artery: Solitary; widely patent without hemodynamically significant stenosis.  IMA: Widely patent without hemodynamically significant stenosis. The distal tributaries of the IMA are widely patent without discrete intraluminal filling defect to suggest distal embolism.  Pelvic vasculature: The bilateral common, external and internal iliac arteries are of normal caliber and widely patent without hemodynamically significant stenosis.  Review of the MIP images confirms the above findings.   --------------------------------------------------------------------------------  Nonvascular Findings:  Grossly unchanged  mild though rather diffuse bowel wall thickening primarily involving the splenic flexure of the colon (representative axial images 96 and 170, series 4, coronal images 29 and 53, series 8). These findings are again associated with a small amounts of adjacent mesenteric stranding and fluid which tracks throughout the left pericolic gutter and into the lower pelvis. No definable/drainable fluid collection. No pneumoperitoneum, pneumatosis or portal venous gas.  The bowel is otherwise normal in course and caliber. Normal appearance of the retrocecal appendix. Moderate colonic stool burden without evidence of obstruction.  Normal hepatic contour. There is a very minimal amount of focal fatty infiltration adjacent to the fissure for ligamentum teres. No discrete hepatic lesions. There is a minimal amount of vicarious excretion of contrast seen within an otherwise normal-appearing gallbladder. No gallbladder wall thickening or pericholecystic fluid. No intra or extrahepatic biliary duct dilatation. No ascites.  There is symmetric enhancement and excretion of the bilateral kidneys. No definite renal stones on this postcontrast examination. No discrete renal lesions. No urinary obstruction or perinephric stranding. There is mild thickening of the medial limb of the left adrenal gland without discrete nodule. Normal appearance of the right adrenal gland, pancreas and spleen. There is a minimal amount of perisplenic fluid.  No retroperitoneal, mesenteric, pelvic or inguinal lymphadenopathy.  Normal appearance of the pelvic organs for age. No discrete adnexal lesion.  Limited visualization of the lower thorax demonstrates minimal subsegmental atelectasis within the imaged bilateral lower lobes as well as the inferior segment of the lingula. No discrete focal airspace opacities. No pleural effusion.  Borderline cardiomegaly. Trace amount of pericardial fluid, presumably physiologic.  No acute or aggressive osseus abnormalities.  Mild to moderate multilevel lumbar spine DDD, worse at L5-S1 with disc space height loss, endplate irregularity and sclerosis. Regional soft tissues appear normal.  IMPRESSION: 1. Grossly unchanged mild though rather diffuse wall thickening extending from the transverse colon to the mid descending colon. There are no discrete intraluminal filling defects within the mesenteric vasculature to suggest embolism as a source of mesenteric ischemia, though note, the distribution of colonic wall thickening could be seen in the setting of a watershed ischemic episode in the setting of profound hypotension. Clinical correlation is advised. In the absence of this clinical history, the colonic wall thickening is favored to be of either infectious and/or inflammatory etiology. 2. Grossly unchanged mesenteric stranding and fluid within the left pericolic gutter extending to the lower pelvis without definable/drainable fluid collection.   Electronically Signed   By: Sandi Mariscal M.D.   On: 09/18/2013 08:12    Microbiology: Recent Results (from the past 240 hour(s))  URINE CULTURE     Status: None   Collection Time    09/17/13  5:53 AM  Result Value Ref Range Status   Specimen Description URINE, CLEAN CATCH   Final   Special Requests NONE   Final   Culture  Setup Time     Final   Value: 09/17/2013 22:59     Performed at [facility] Count     Final   Value: 2,000 COLONIES/ML     Performed at [facility]   Culture     Final   Value: INSIGNIFICANT GROWTH     Performed at [facility]   Report Status 09/18/2013 FINAL   Final  STOOL CULTURE     Status: None   Collection Time    09/18/13  4:30 AM      Result Value Ref Range Status   Specimen Description STOOL   Final   Special Requests NONE   Final   Culture     Final   Value: NO SUSPICIOUS COLONIES, CONTINUING TO HOLD     Performed at [facility]   Report Status PENDING   Incomplete  CLOSTRIDIUM DIFFICILE BY  PCR     Status: None   Collection Time    09/18/13  4:30 AM      Result Value Ref Range Status   C difficile by pcr NEGATIVE  NEGATIVE Final  CULTURE, BLOOD (SINGLE)     Status: None   Collection Time    09/18/13  7:15 AM      Result Value Ref Range Status   Specimen Description BLOOD RIGHT ARM   Final   Special Requests BOTTLES DRAWN AEROBIC ONLY 10CC   Final   Culture  Setup Time     Final   Value: 09/18/2013 18:44     Performed at [facility]   Culture     Final   Value:        BLOOD CULTURE RECEIVED NO GROWTH TO DATE CULTURE WILL BE HELD FOR 5 DAYS BEFORE ISSUING A FINAL NEGATIVE REPORT     Performed at [facility]   Report Status PENDING   Incomplete  CULTURE, BLOOD (ROUTINE X 2)     Status: None   Collection Time    09/18/13  7:20 AM      Result Value Ref Range Status   Specimen Description BLOOD RIGHT ARM   Final   Special Requests BOTTLES DRAWN AEROBIC ONLY 5CC   Final   Culture  Setup Time     Final   Value: 09/18/2013 18:44     Performed at [facility]   Culture     Final   Value:        BLOOD CULTURE RECEIVED NO GROWTH TO DATE CULTURE WILL BE HELD FOR 5 DAYS BEFORE ISSUING A FINAL NEGATIVE REPORT     Performed at [facility]   Report Status PENDING   Incomplete     Labs: Basic Metabolic Panel:  Recent Labs Lab 09/17/13 0440 09/18/13 0715  NA 141 141  K 3.1* 4.2  CL 101 104  CO2 23 25  GLUCOSE 131* 112*  BUN 21 22  CREATININE 0.90 0.86  CALCIUM 10.3 8.5   Liver Function Tests:  Recent Labs Lab 09/17/13 0440 09/18/13 0715  AST 23 12  ALT 19 13  ALKPHOS 71 50  BILITOT 0.3 0.7  PROT 6.9 5.7*  ALBUMIN 4.2 3.0*    Recent Labs Lab 09/17/13 0440  LIPASE 45   No results found for this basename: AMMONIA,  in  the last 168 hours CBC:  Recent Labs Lab 09/17/13 0440 09/18/13 0715  WBC 6.3 8.8  NEUTROABS 4.3  --   HGB 13.1 10.8*  HCT 38.2 32.8*  MCV 88.8 90.4  PLT 165 147*   Cardiac Enzymes: No results  found for this basename: CKTOTAL, CKMB, CKMBINDEX, TROPONINI,  in the last 168 hours BNP: BNP (last 3 results) No results found for this basename: PROBNP,  in the last 8760 hours CBG: No results found for this basename: GLUCAP,  in the last 168 hours  Signed:  Lynae Pederson K  [facility] 09/20/2013, 2:49 PM

## 2013-09-20 NOTE — Progress Notes (Signed)
NURSING PROGRESS NOTE  Monique Gonzalez [id number] Discharge Data: 09/20/2013 3:09 PM Attending Provider: Donne Hazel, MD HWE:[id number],CVELFY TOM, MD     [facility] Abide to be D/C'd Home per MD order.  Discussed with the patient the After Visit Summary and all questions fully answered. All IV's discontinued with no bleeding noted. All belongings returned to patient for patient to take home.   Last Vital Signs:  Blood pressure 148/74, pulse 71, temperature 99.5 F (37.5 C), temperature source Oral, resp. rate 16, height 5\' 10"  (1.778 m), weight 109.498 kg (241 lb 6.4 oz), SpO2 99.00%.  Discharge Medication List   Medication List         acyclovir cream 5 %  Commonly known as:  ZOVIRAX  Apply 1 application topically every 3 (three) hours.     calcium-vitamin D 500-200 MG-UNIT per tablet  Commonly known as:  OSCAL WITH D  Take 1 tablet by mouth daily.     citalopram 20 MG tablet  Commonly known as:  CELEXA  Take 1 tablet (20 mg total) by mouth daily.     docusate sodium 100 MG capsule  Commonly known as:  COLACE  Take 1 capsule (100 mg total) by mouth 2 (two) times daily.     fish oil-omega-3 fatty acids 1000 MG capsule  Take 2 g by mouth daily.     fluticasone 50 MCG/ACT nasal spray  Commonly known as:  FLONASE  Place 2 sprays into both nostrils daily.     GLUCOSAMINE MSM COMPLEX Tabs  Take 1 tablet by mouth daily.     LINZESS 290 MCG Caps capsule  Generic drug:  Linaclotide  Take 290 mcg by mouth daily.     loratadine 10 MG tablet  Commonly known as:  CLARITIN  Take 10 mg by mouth daily.     Lysine 500 MG Tabs  Take 1 tablet by mouth daily.     meloxicam 7.5 MG tablet  Commonly known as:  MOBIC  Take 1 tablet (7.5 mg total) by mouth daily.     multivitamin,tx-minerals tablet  Take 1 tablet by mouth daily.     polyethylene glycol powder powder  Commonly known as:  MIRALAX  Take 255 g (1 Container total) by mouth once.     valACYclovir 1000 MG tablet  Commonly  known as:  VALTREX  Take 2,000 mg by mouth 2 (two) times daily as needed (for 2 doses for flare ups).     Vitamin D 1000 UNITS capsule  Take 2,000 Units by mouth daily.     vitamin E 400 UNIT capsule  Take 400 Units by mouth daily.

## 2013-09-20 NOTE — Discharge Instructions (Signed)

## 2013-09-21 ENCOUNTER — Other Ambulatory Visit: Payer: Self-pay | Admitting: Family Medicine

## 2013-09-21 MED ORDER — ONDANSETRON HCL 8 MG PO TABS: 8.0000 mg | ORAL_TABLET | Freq: Three times a day (TID) | ORAL | Status: DC

## 2013-09-22 ENCOUNTER — Other Ambulatory Visit: Payer: Self-pay | Admitting: Family Medicine

## 2013-09-22 ENCOUNTER — Encounter: Payer: Self-pay | Admitting: Gastroenterology

## 2013-09-22 DIAGNOSIS — K5289 Other specified noninfective gastroenteritis and colitis: Secondary | ICD-10-CM

## 2013-09-22 DIAGNOSIS — K5909 Other constipation: Secondary | ICD-10-CM

## 2013-09-22 LAB — STOOL CULTURE

## 2013-09-24 LAB — CULTURE, BLOOD (SINGLE): CULTURE: NO GROWTH

## 2013-09-24 LAB — CULTURE, BLOOD (ROUTINE X 2): Culture: NO GROWTH

## 2013-09-26 ENCOUNTER — Encounter: Payer: Self-pay | Admitting: Physician Assistant

## 2013-09-28 ENCOUNTER — Inpatient Hospital Stay: Payer: 59 | Admitting: Physician Assistant

## 2013-09-29 ENCOUNTER — Encounter: Payer: Self-pay | Admitting: Physician Assistant

## 2013-09-29 ENCOUNTER — Ambulatory Visit (INDEPENDENT_AMBULATORY_CARE_PROVIDER_SITE_OTHER): Payer: 59 | Admitting: Physician Assistant

## 2013-09-29 VITALS — BP 112/62 | HR 70 | Temp 98.5°F | Resp 18 | Wt 233.0 lb

## 2013-09-29 DIAGNOSIS — K5289 Other specified noninfective gastroenteritis and colitis: Secondary | ICD-10-CM

## 2013-09-29 DIAGNOSIS — K529 Noninfective gastroenteritis and colitis, unspecified: Secondary | ICD-10-CM

## 2013-09-29 DIAGNOSIS — K5900 Constipation, unspecified: Secondary | ICD-10-CM

## 2013-09-29 NOTE — Progress Notes (Signed)
[name] MRN: [id number], DOB: 05/14/1955, 58 y.o. Date of Encounter: 09/29/2013, 2:30 PM    Chief Complaint:  Chief Complaint  Patient presents with  . Hospitalization Follow-up     HPI: 58 y.o. year old female here for office visit followup recent hospitalization.  Presents to ER with nausea vomiting and abdominal discomfort. Subsequently was admitted for evaluation. Underwent CT of abdomen and pelvis. This revealed colon filled with stool. Also area of colon with thickened wall consistent with inflammation versus infection as cause of colitis. WBC normal. Afebrile.  GI pathogen panel and other labs normal. Bowel Was cleaned out with enemas and laxatives.   Patient states that she is now tolerating solid foods but try and eat small amounts. States that she was taking Linzess daily even prior to the hospitalization. Since the hospitalization, she has continued to take the Linzess daily but is also is taking stool softener twice a day and MiraLax one capful daily. She has added these--taking on a routine basis now--since the hospitalization. Also is drinking increased amount of water.  She has followup appointment with Dr. Ardis Hughs with [facility] for Monday 10/03/13.     Home Meds:   Outpatient Prescriptions Prior to Visit  Medication Sig Dispense Refill  . acyclovir cream (ZOVIRAX) 5 % Apply 1 application topically every 3 (three) hours.  15 g  3  . calcium-vitamin D (OSCAL WITH D) 500-200 MG-UNIT per tablet Take 1 tablet by mouth daily.       . Cholecalciferol (VITAMIN D) 1000 UNITS capsule Take 2,000 Units by mouth daily.       . citalopram (CELEXA) 20 MG tablet Take 1 tablet (20 mg total) by mouth daily.  90 tablet  1  . docusate sodium (COLACE) 100 MG capsule Take 1 capsule (100 mg total) by mouth 2 (two) times daily.  10 capsule  0  . fish oil-omega-3 fatty acids 1000 MG capsule Take 2 g by mouth daily.      . fluticasone (FLONASE) 50 MCG/ACT nasal spray  Place 2 sprays into both nostrils daily.  16 g  6  . Glucos-MSM-C-Mn-Ginger-Willow (GLUCOSAMINE MSM COMPLEX) TABS Take 1 tablet by mouth daily.       . Linaclotide (LINZESS) 290 MCG CAPS capsule Take 290 mcg by mouth daily.      Marland Kitchen loratadine (CLARITIN) 10 MG tablet Take 10 mg by mouth daily.        Marland Kitchen Lysine 500 MG TABS Take 1 tablet by mouth daily.       . meloxicam (MOBIC) 7.5 MG tablet Take 1 tablet (7.5 mg total) by mouth daily.  90 tablet  1  . Multiple Vitamins-Minerals (MULTIVITAMIN,TX-MINERALS) tablet Take 1 tablet by mouth daily.        . ondansetron (ZOFRAN) 8 MG tablet Take 1 tablet (8 mg total) by mouth 3 (three) times daily.  60 tablet  0  . polyethylene glycol (MIRALAX / GLYCOLAX) packet Take 17 g by mouth daily.  14 each  0  . valACYclovir (VALTREX) 1000 MG tablet Take 2,000 mg by mouth 2 (two) times daily as needed (for 2 doses for flare ups).      . vitamin E 400 UNIT capsule Take 400 Units by mouth daily.         No facility-administered medications prior to visit.    Allergies:  Allergies  Allergen Reactions  . Penicillins Rash  . Sulfa Antibiotics Rash      Review of Systems:  See HPI for pertinent ROS. All other ROS negative.    Physical Exam: Blood pressure 112/62, pulse 70, temperature 98.5 F (36.9 C), temperature source Oral, resp. rate 18, weight 233 lb (105.688 kg)., Body mass index is 33.43 kg/(m^2). General:  WF. Appears in no acute distress. Neck: Supple. No thyromegaly. No lymphadenopathy. Lungs: Clear bilaterally to auscultation without wheezes, rales, or rhonchi. Breathing is unlabored. Heart: Regular rhythm. No murmurs, rubs, or gallops. Abdomen: Soft, non-tender, non-distended with normoactive bowel sounds. No hepatomegaly. No rebound/guarding. No obvious abdominal masses. Msk:  Strength and tone normal for age. Extremities/Skin: Warm and dry. No edema.  Neuro: Alert and oriented X 3. Moves all extremities spontaneously. Gait is normal. CNII-XII  grossly in tact. Psych:  Responds to questions appropriately with a normal affect.     ASSESSMENT AND PLAN:  58 y.o. year old female with  1. Constipation, unspecified constipation type  2. Colitis  Continue Linzess, stool softener twice a day, MiraLax 1 capful daily. Continue increased water intake. Followup with Dr. Ardis Hughs at [facility] on Monday 10/03/13.    [address] [place], [place], [facility] 09/29/2013 2:30 PM

## 2013-10-03 ENCOUNTER — Ambulatory Visit (INDEPENDENT_AMBULATORY_CARE_PROVIDER_SITE_OTHER): Payer: 59 | Admitting: Gastroenterology

## 2013-10-03 ENCOUNTER — Encounter: Payer: Self-pay | Admitting: Gastroenterology

## 2013-10-03 VITALS — BP 114/70 | HR 72 | Ht 70.0 in | Wt 230.0 lb

## 2013-10-03 DIAGNOSIS — K5900 Constipation, unspecified: Secondary | ICD-10-CM

## 2013-10-03 MED ORDER — MOVIPREP 100 G PO SOLR: 1.0000 | Freq: Once | ORAL | Status: DC

## 2013-10-03 NOTE — Patient Instructions (Addendum)
You will be set up for a colonoscopy for change in bowels, abnormal colon on CT. Stay on daily linzess and every other day miralax. Stop probiotic, stop scheduled colace.

## 2013-10-03 NOTE — Progress Notes (Signed)
HPI:   CT 09/2013: Stool-filled colon. Wall thickening in the transverse and descending colon suggesting infectious or inflammatory colitis. Small amount offree fluid along the spleen, left pericolic gutter, and pelvis is likely related to the inflammatory colonic process. CT angio 09/2013: IMPRESSION: 1. Grossly unchanged mild though rather diffuse wall thickening extending from the transverse colon to the mid descending colon. There are no discrete intraluminal filling defects within the mesenteric vasculature to suggest embolism as a source of mesenteric ischemia, though note, the distribution of colonic wall thickening could be seen in the setting of a watershed ischemic episode in the setting of profound hypotension. Clinical correlation is advised. In the absence of this clinical history, the colonic wall thickening is favored to be of either infectious and/or inflammatory etiology. 2. Grossly unchanged mesenteric stranding and fluid within the left pericolic gutter extending to the lower pelvis without definable/drainable fluid collection.  Labs 7/2015L c diff PCR neg, stool pathogen panel neg, CBC normal, CMET essentially  Normal  This is a  very pleasant 58 year old woman whom I am meeting for the first time today.  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  Has trouble with constipation; very large difficult to move symptoms.   She has had terrible episodes of left lower Quad pain, cause her to nearly pass out from the pain.  Previously she had BM every 1-2 days; usually large stool, hard to push out, sometimes bleeding a bit.  If she had no BM in 2-3 days would try dulcolax.    Started linzess about a year ago.  This helped; had BM usually daily with less straining.  Has been dieting, lost 80 pounds (high fiber, fruits, salads, excercising).  Episdoes are acute, LLQ pain,   GAve her enemas,    Review of systems: Pertinent positive and negative review of systems were noted in the above  HPI section. Complete review of systems was performed and was otherwise normal.    Past Medical History  Diagnosis Date  . Seasonal allergies   . Osteoarthritis   . Depression   . Hyperlipidemia   . Recurrent cold sores   . Syncope and collapse 11/30/2012  . Vasovagal syncope     Past Surgical History  Procedure Laterality Date  . Tonsillectomy      Current Outpatient Prescriptions  Medication Sig Dispense Refill  . acyclovir cream (ZOVIRAX) 5 % Apply 1 application topically every 3 (three) hours.  15 g  3  . calcium-vitamin D (OSCAL WITH D) 500-200 MG-UNIT per tablet Take 1 tablet by mouth daily.       . Cholecalciferol (VITAMIN D) 1000 UNITS capsule Take 2,000 Units by mouth daily.       . citalopram (CELEXA) 20 MG tablet Take 1 tablet (20 mg total) by mouth daily.  90 tablet  1  . docusate sodium (COLACE) 100 MG capsule Take 1 capsule (100 mg total) by mouth 2 (two) times daily.  10 capsule  0  . fish oil-omega-3 fatty acids 1000 MG capsule Take 2 g by mouth daily.      . fluticasone (FLONASE) 50 MCG/ACT nasal spray Place 2 sprays into both nostrils daily.  16 g  6  . Glucos-MSM-C-Mn-Ginger-Willow (GLUCOSAMINE MSM COMPLEX) TABS Take 1 tablet by mouth daily.       . Linaclotide (LINZESS) 290 MCG CAPS capsule Take 290 mcg by mouth daily.      Marland Kitchen loratadine (CLARITIN) 10 MG tablet Take 10 mg by mouth daily.        Marland Kitchen  Lysine 500 MG TABS Take 1 tablet by mouth daily.       . meloxicam (MOBIC) 7.5 MG tablet Take 1 tablet (7.5 mg total) by mouth daily.  90 tablet  1  . Multiple Vitamins-Minerals (MULTIVITAMIN,TX-MINERALS) tablet Take 1 tablet by mouth daily.        . ondansetron (ZOFRAN) 8 MG tablet Take 1 tablet (8 mg total) by mouth 3 (three) times daily.  60 tablet  0  . polyethylene glycol (MIRALAX / GLYCOLAX) packet Take 17 g by mouth daily.  14 each  0  . Probiotic Product (PROBIOTIC DAILY PO) Take 1 capsule by mouth daily.      . valACYclovir (VALTREX) 1000 MG tablet Take 2,000  mg by mouth 2 (two) times daily as needed (for 2 doses for flare ups).      . vitamin E 400 UNIT capsule Take 400 Units by mouth daily.         No current facility-administered medications for this visit.    Allergies as of 10/03/2013 - Review Complete 10/03/2013  Allergen Reaction Noted  . Penicillins Rash 09/23/2010  . Sulfa antibiotics Rash 09/23/2010    Family History  Problem Relation Age of Onset  . Sudden death Neg Hx   . Hypertension Neg Hx   . Hyperlipidemia Neg Hx   . Heart attack Neg Hx   . Diabetes Neg Hx   . Stroke Mother   . Arthritis Mother   . Cancer Father   . Arthritis Sister   . Cancer Maternal Grandmother   . Stroke Maternal Grandfather   . Cancer Maternal Grandfather   . Cancer Paternal Grandmother   . Heart disease Paternal Grandmother   . Stroke Paternal Grandfather   . Arthritis Sister     History   Social History  . Marital Status: Divorced    Spouse Name: N/A    Number of Children: 2  . Years of Education: college   Occupational History  . [phone]     LPN   Social History Main Topics  . Smoking status: Former Research scientist (life sciences)  . Smokeless tobacco: Never Used  . Alcohol Use: Yes     Comment: one drink per week  . Drug Use: No  . Sexual Activity: Not on file   Other Topics Concern  . Not on file   Social History Narrative  . No narrative on file       Physical Exam: BP 114/70  Pulse 72  Ht 5\' 10"  (1.778 m)  Wt 230 lb (104.327 kg)  BMI 33.00 kg/m2 Constitutional: generally well-appearing Psychiatric: alert and oriented x3 Eyes: extraocular movements intact Mouth: oral pharynx moist, no lesions Neck: supple no lymphadenopathy Cardiovascular: heart regular rate and rhythm Lungs: clear to auscultation bilaterally Abdomen: soft, nontender, nondistended, no obvious ascites, no peritoneal signs, normal bowel sounds Extremities: no lower extremity edema bilaterally Skin: no lesions on visible extremities    Assessment and  plan: 58 y.o. female with  chronic constipation, intermittent left lower quadrant meds, abnormal colon on CT scan  Her episodes of left lower quadrant pain, vasovagal type symptoms may indeed be related to simple constipation. If that is the case then I think if we keep her from becoming constipated the events, episodes will resolve. Her colon looked thickened by CT scan and her last colonoscopy was 7 or 8 years ago. I will like to repeat that now to rule out structural causes. She has been told she had colitis in the past but  I am not sure that that is the case in fact. She will stay on  Linzess,  once daily and MiraLax every other day. She will stop all other agents for her bowel movements.

## 2013-10-07 ENCOUNTER — Ambulatory Visit: Payer: 59 | Admitting: Gastroenterology

## 2013-10-09 ENCOUNTER — Emergency Department (HOSPITAL_BASED_OUTPATIENT_CLINIC_OR_DEPARTMENT_OTHER): Payer: 59

## 2013-10-09 ENCOUNTER — Encounter (HOSPITAL_BASED_OUTPATIENT_CLINIC_OR_DEPARTMENT_OTHER): Payer: Self-pay | Admitting: Emergency Medicine

## 2013-10-09 ENCOUNTER — Emergency Department (HOSPITAL_BASED_OUTPATIENT_CLINIC_OR_DEPARTMENT_OTHER)
Admission: EM | Admit: 2013-10-09 | Discharge: 2013-10-09 | Disposition: A | Discharge status: Home / Self Care | Payer: 59 | Source: Home / Self Care | Attending: Emergency Medicine | Admitting: Emergency Medicine

## 2013-10-09 ENCOUNTER — Inpatient Hospital Stay (HOSPITAL_BASED_OUTPATIENT_CLINIC_OR_DEPARTMENT_OTHER)
Admission: EM | Admit: 2013-10-09 | Discharge: 2013-10-20 | DRG: 331 | Disposition: A | Discharge status: Home / Self Care | Payer: 59 | Attending: General Surgery | Admitting: General Surgery

## 2013-10-09 DIAGNOSIS — Z6832 Body mass index (BMI) 32.0-32.9, adult: Secondary | ICD-10-CM

## 2013-10-09 DIAGNOSIS — R112 Nausea with vomiting, unspecified: Secondary | ICD-10-CM | POA: Insufficient documentation

## 2013-10-09 DIAGNOSIS — K5289 Other specified noninfective gastroenteritis and colitis: Secondary | ICD-10-CM | POA: Diagnosis not present

## 2013-10-09 DIAGNOSIS — K5900 Constipation, unspecified: Secondary | ICD-10-CM

## 2013-10-09 DIAGNOSIS — R1013 Epigastric pain: Secondary | ICD-10-CM

## 2013-10-09 DIAGNOSIS — Z880 Allergy status to penicillin: Secondary | ICD-10-CM

## 2013-10-09 DIAGNOSIS — F329 Major depressive disorder, single episode, unspecified: Secondary | ICD-10-CM | POA: Diagnosis present

## 2013-10-09 DIAGNOSIS — D649 Anemia, unspecified: Secondary | ICD-10-CM | POA: Diagnosis present

## 2013-10-09 DIAGNOSIS — M1990 Unspecified osteoarthritis, unspecified site: Secondary | ICD-10-CM | POA: Insufficient documentation

## 2013-10-09 DIAGNOSIS — E876 Hypokalemia: Secondary | ICD-10-CM | POA: Diagnosis present

## 2013-10-09 DIAGNOSIS — R933 Abnormal findings on diagnostic imaging of other parts of digestive tract: Secondary | ICD-10-CM

## 2013-10-09 DIAGNOSIS — K529 Noninfective gastroenteritis and colitis, unspecified: Secondary | ICD-10-CM

## 2013-10-09 DIAGNOSIS — Z87891 Personal history of nicotine dependence: Secondary | ICD-10-CM

## 2013-10-09 DIAGNOSIS — Z79899 Other long term (current) drug therapy: Secondary | ICD-10-CM

## 2013-10-09 DIAGNOSIS — E785 Hyperlipidemia, unspecified: Secondary | ICD-10-CM | POA: Diagnosis present

## 2013-10-09 DIAGNOSIS — R197 Diarrhea, unspecified: Secondary | ICD-10-CM

## 2013-10-09 DIAGNOSIS — K56609 Unspecified intestinal obstruction, unspecified as to partial versus complete obstruction: Secondary | ICD-10-CM | POA: Diagnosis not present

## 2013-10-09 DIAGNOSIS — IMO0002 Reserved for concepts with insufficient information to code with codable children: Secondary | ICD-10-CM

## 2013-10-09 DIAGNOSIS — F3289 Other specified depressive episodes: Secondary | ICD-10-CM | POA: Diagnosis present

## 2013-10-09 DIAGNOSIS — K56699 Other intestinal obstruction unspecified as to partial versus complete obstruction: Secondary | ICD-10-CM

## 2013-10-09 DIAGNOSIS — Z881 Allergy status to other antibiotic agents status: Secondary | ICD-10-CM | POA: Diagnosis not present

## 2013-10-09 DIAGNOSIS — F32A Depression, unspecified: Secondary | ICD-10-CM

## 2013-10-09 DIAGNOSIS — R110 Nausea: Secondary | ICD-10-CM

## 2013-10-09 DIAGNOSIS — R1010 Upper abdominal pain, unspecified: Secondary | ICD-10-CM

## 2013-10-09 DIAGNOSIS — R1084 Generalized abdominal pain: Secondary | ICD-10-CM

## 2013-10-09 DIAGNOSIS — R1011 Right upper quadrant pain: Secondary | ICD-10-CM

## 2013-10-09 HISTORY — DX: Noninfective gastroenteritis and colitis, unspecified: K52.9

## 2013-10-09 LAB — URINALYSIS, ROUTINE W REFLEX MICROSCOPIC
BILIRUBIN URINE: NEGATIVE
Glucose, UA: NEGATIVE mg/dL
Hgb urine dipstick: NEGATIVE
KETONES UR: NEGATIVE mg/dL
Nitrite: NEGATIVE
PH: 6.5 (ref 5.0–8.0)
Protein, ur: NEGATIVE mg/dL
Specific Gravity, Urine: 1.019 (ref 1.005–1.030)
Urobilinogen, UA: 1 mg/dL (ref 0.0–1.0)

## 2013-10-09 LAB — CBC WITH DIFFERENTIAL/PLATELET
Basophils Absolute: 0 10*3/uL (ref 0.0–0.1)
Basophils Relative: 0 % (ref 0–1)
EOS PCT: 0 % (ref 0–5)
Eosinophils Absolute: 0 10*3/uL (ref 0.0–0.7)
HEMATOCRIT: 36.3 % (ref 36.0–46.0)
Hemoglobin: 12.3 g/dL (ref 12.0–15.0)
LYMPHS ABS: 1.4 10*3/uL (ref 0.7–4.0)
LYMPHS PCT: 16 % (ref 12–46)
MCH: 30 pg (ref 26.0–34.0)
MCHC: 33.9 g/dL (ref 30.0–36.0)
MCV: 88.5 fL (ref 78.0–100.0)
MONO ABS: 0.9 10*3/uL (ref 0.1–1.0)
Monocytes Relative: 11 % (ref 3–12)
Neutro Abs: 6.3 10*3/uL (ref 1.7–7.7)
Neutrophils Relative %: 73 % (ref 43–77)
Platelets: 276 10*3/uL (ref 150–400)
RBC: 4.1 MIL/uL (ref 3.87–5.11)
RDW: 12.6 % (ref 11.5–15.5)
WBC: 8.7 10*3/uL (ref 4.0–10.5)

## 2013-10-09 LAB — COMPREHENSIVE METABOLIC PANEL
ALT: 12 U/L (ref 0–35)
AST: 13 U/L (ref 0–37)
Albumin: 3.6 g/dL (ref 3.5–5.2)
Alkaline Phosphatase: 51 U/L (ref 39–117)
Anion gap: 14 (ref 5–15)
BUN: 19 mg/dL (ref 6–23)
CALCIUM: 10 mg/dL (ref 8.4–10.5)
CO2: 25 mEq/L (ref 19–32)
CREATININE: 0.8 mg/dL (ref 0.50–1.10)
Chloride: 103 mEq/L (ref 96–112)
GFR calc Af Amer: >90 mL/min (ref >90)
GFR calc non Af Amer: 79 mL/min — ABNORMAL LOW (ref >90)
Glucose, Bld: 131 mg/dL — ABNORMAL HIGH (ref 70–99)
Potassium: 4.1 mEq/L (ref 3.7–5.3)
Sodium: 142 mEq/L (ref 137–147)
Total Bilirubin: 0.3 mg/dL (ref 0.3–1.2)
Total Protein: 6.9 g/dL (ref 6.0–8.3)

## 2013-10-09 LAB — I-STAT CG4 LACTIC ACID, ED: Lactic Acid, Venous: 1.76 mmol/L (ref 0.5–2.2)

## 2013-10-09 LAB — URINE MICROSCOPIC-ADD ON

## 2013-10-09 LAB — LIPASE, BLOOD: Lipase: 33 U/L (ref 11–59)

## 2013-10-09 MED ORDER — HYDROMORPHONE HCL PF 1 MG/ML IJ SOLN
1.0000 mg | Freq: Once | INTRAMUSCULAR | Status: AC
Start: 2013-10-09 — End: 2013-10-09
  Administered 2013-10-09: 1 mg via INTRAVENOUS
  Filled 2013-10-09: qty 1

## 2013-10-09 MED ORDER — HYDROMORPHONE HCL PF 1 MG/ML IJ SOLN
1.0000 mg | Freq: Once | INTRAMUSCULAR | Status: AC
  Administered 2013-10-09: 1 mg via INTRAVENOUS
  Filled 2013-10-09: qty 1

## 2013-10-09 MED ORDER — SODIUM CHLORIDE 0.9 % IV BOLUS (SEPSIS)
500.0000 mL | Freq: Once | INTRAVENOUS | Status: AC
  Administered 2013-10-09: 500 mL via INTRAVENOUS

## 2013-10-09 MED ORDER — IOHEXOL 300 MG/ML  SOLN
100.0000 mL | Freq: Once | INTRAMUSCULAR | Status: AC | PRN
  Administered 2013-10-09: 100 mL via INTRAVENOUS

## 2013-10-09 MED ORDER — HYDROCODONE-ACETAMINOPHEN 5-325 MG PO TABS: 1.0000 | ORAL_TABLET | Freq: Four times a day (QID) | ORAL | Status: DC | PRN

## 2013-10-09 MED ORDER — ONDANSETRON HCL 4 MG/2ML IJ SOLN
4.0000 mg | Freq: Once | INTRAMUSCULAR | Status: AC
  Administered 2013-10-09: 4 mg via INTRAVENOUS
  Filled 2013-10-09: qty 2

## 2013-10-09 MED ORDER — METRONIDAZOLE IN NACL 5-0.79 MG/ML-% IV SOLN
500.0000 mg | Freq: Once | INTRAVENOUS | Status: AC
  Administered 2013-10-10: 500 mg via INTRAVENOUS
  Filled 2013-10-09 (×2): qty 100

## 2013-10-09 MED ORDER — IOHEXOL 300 MG/ML  SOLN
50.0000 mL | Freq: Once | INTRAMUSCULAR | Status: AC | PRN
  Administered 2013-10-09: 50 mL via ORAL

## 2013-10-09 MED ORDER — HALOPERIDOL LACTATE 5 MG/ML IJ SOLN
2.0000 mg | Freq: Once | INTRAMUSCULAR | Status: AC
  Administered 2013-10-09: 2 mg via INTRAVENOUS
  Filled 2013-10-09: qty 1

## 2013-10-09 MED ORDER — CIPROFLOXACIN IN D5W 400 MG/200ML IV SOLN
400.0000 mg | Freq: Once | INTRAVENOUS | Status: AC
  Administered 2013-10-09: 400 mg via INTRAVENOUS
  Filled 2013-10-09: qty 200

## 2013-10-09 MED ORDER — SODIUM CHLORIDE 0.9 % IV BOLUS (SEPSIS)
1000.0000 mL | Freq: Once | INTRAVENOUS | Status: AC
  Administered 2013-10-09: 1000 mL via INTRAVENOUS

## 2013-10-09 NOTE — ED Notes (Signed)
Pt just d/c from here this morning for abd pain and n/v.  Reports pain worse and still unable to keep foods/liquids down.

## 2013-10-09 NOTE — Discharge Instructions (Signed)
Abdominal Pain, Women °Abdominal (stomach, pelvic, or belly) pain can be caused by many things. It is important to tell your doctor: °· The location of the pain. °· Does it come and go or is it present all the time? °· Are there things that start the pain (eating certain foods, exercise)? °· Are there other symptoms associated with the pain (fever, nausea, vomiting, diarrhea)? °All of this is helpful to know when trying to find the cause of the pain. °CAUSES  °· Stomach: virus or bacteria infection, or ulcer. °· Intestine: appendicitis (inflamed appendix), regional ileitis (Crohn's disease), ulcerative colitis (inflamed colon), irritable bowel syndrome, diverticulitis (inflamed diverticulum of the colon), or cancer of the stomach or intestine. °· Gallbladder disease or stones in the gallbladder. °· Kidney disease, kidney stones, or infection. °· Pancreas infection or cancer. °· Fibromyalgia (pain disorder). °· Diseases of the female organs: °¨ Uterus: fibroid (non-cancerous) tumors or infection. °¨ Fallopian tubes: infection or tubal pregnancy. °¨ Ovary: cysts or tumors. °¨ Pelvic adhesions (scar tissue). °¨ Endometriosis (uterus lining tissue growing in the pelvis and on the pelvic organs). °¨ Pelvic congestion syndrome (female organs filling up with blood just before the menstrual period). °¨ Pain with the menstrual period. °¨ Pain with ovulation (producing an egg). °¨ Pain with an IUD (intrauterine device, birth control) in the uterus. °¨ Cancer of the female organs. °· Functional pain (pain not caused by a disease, may improve without treatment). °· Psychological pain. °· Depression. °DIAGNOSIS  °Your doctor will decide the seriousness of your pain by doing an examination. °· Blood tests. °· X-rays. °· Ultrasound. °· CT scan (computed tomography, special type of X-ray). °· MRI (magnetic resonance imaging). °· Cultures, for infection. °· Barium enema (dye inserted in the large intestine, to better view it with  X-rays). °· Colonoscopy (looking in intestine with a lighted tube). °· Laparoscopy (minor surgery, looking in abdomen with a lighted tube). °· Major abdominal exploratory surgery (looking in abdomen with a large incision). °TREATMENT  °The treatment will depend on the cause of the pain.  °· Many cases can be observed and treated at home. °· Over-the-counter medicines recommended by your caregiver. °· Prescription medicine. °· Antibiotics, for infection. °· Birth control pills, for painful periods or for ovulation pain. °· Hormone treatment, for endometriosis. °· Nerve blocking injections. °· Physical therapy. °· Antidepressants. °· Counseling with a psychologist or psychiatrist. °· Minor or major surgery. °HOME CARE INSTRUCTIONS  °· Do not take laxatives, unless directed by your caregiver. °· Take over-the-counter pain medicine only if ordered by your caregiver. Do not take aspirin because it can cause an upset stomach or bleeding. °· Try a clear liquid diet (broth or water) as ordered by your caregiver. Slowly move to a bland diet, as tolerated, if the pain is related to the stomach or intestine. °· Have a thermometer and take your temperature several times a day, and record it. °· Bed rest and sleep, if it helps the pain. °· Avoid sexual intercourse, if it causes pain. °· Avoid stressful situations. °· Keep your follow-up appointments and tests, as your caregiver orders. °· If the pain does not go away with medicine or surgery, you may try: °¨ Acupuncture. °¨ Relaxation exercises (yoga, meditation). °¨ Group therapy. °¨ Counseling. °SEEK MEDICAL CARE IF:  °· You notice certain foods cause stomach pain. °· Your home care treatment is not helping your pain. °· You need stronger pain medicine. °· You want your IUD removed. °· You feel faint or   lightheaded. °· You develop nausea and vomiting. °· You develop a rash. °· You are having side effects or an allergy to your medicine. °SEEK IMMEDIATE MEDICAL CARE IF:  °· Your  pain does not go away or gets worse. °· You have a fever. °· Your pain is felt only in portions of the abdomen. The right side could possibly be appendicitis. The left lower portion of the abdomen could be colitis or diverticulitis. °· You are passing blood in your stools (bright red or black tarry stools, with or without vomiting). °· You have blood in your urine. °· You develop chills, with or without a fever. °· You pass out. °MAKE SURE YOU:  °· Understand these instructions. °· Will watch your condition. °· Will get help right away if you are not doing well or get worse. °Document Released: 12/22/2006 Document Revised: 07/11/2013 Document Reviewed: 01/11/2009 °ExitCare® Patient Information ©2015 [employer]. This information is not intended to replace advice given to you by your health care provider. Make sure you discuss any questions you have with your health care provider. ° °

## 2013-10-09 NOTE — ED Provider Notes (Signed)
CSN: [id number]     Arrival date & time 10/09/13  0607 History   First MD Initiated Contact with Patient 10/09/13 0654     Chief Complaint  Patient presents with  . Abdominal Pain  . Emesis     (Consider location/radiation/quality/duration/timing/severity/associated sxs/prior Treatment) Patient is a 58 y.o. female presenting with abdominal pain and vomiting. The history is provided by the patient.  Abdominal Pain Pain location:  Epigastric and RUQ Pain quality: cramping   Pain radiates to:  Does not radiate Pain severity:  Moderate Onset quality:  Sudden Duration:  4 hours Timing:  Intermittent Progression:  Waxing and waning Chronicity:  Recurrent Context comment:  At rest Relieved by:  Nothing Worsened by:  Nothing tried Ineffective treatments:  None tried Associated symptoms: nausea and vomiting   Associated symptoms: no chest pain, no cough, no diarrhea, no dysuria, no fatigue, no fever, no hematuria and no shortness of breath   Emesis Associated symptoms: abdominal pain   Associated symptoms: no diarrhea and no headaches     Past Medical History  Diagnosis Date  . Seasonal allergies   . Osteoarthritis   . Depression   . Hyperlipidemia   . Recurrent cold sores   . Syncope and collapse 11/30/2012  . Vasovagal syncope    Past Surgical History  Procedure Laterality Date  . Tonsillectomy     Family History  Problem Relation Age of Onset  . Sudden death Neg Hx   . Hypertension Neg Hx   . Hyperlipidemia Neg Hx   . Heart attack Neg Hx   . Diabetes Neg Hx   . Stroke Mother   . Arthritis Mother   . Cancer Father   . Arthritis Sister   . Cancer Maternal Grandmother   . Stroke Maternal Grandfather   . Cancer Maternal Grandfather   . Cancer Paternal Grandmother   . Heart disease Paternal Grandmother   . Stroke Paternal Grandfather   . Arthritis Sister    History  Substance Use Topics  . Smoking status: Former Research scientist (life sciences)  . Smokeless tobacco: Never Used  .  Alcohol Use: Yes     Comment: one drink per week   OB History   Grav Para Term Preterm Abortions TAB SAB Ect Mult Living                 Review of Systems  Constitutional: Negative for fever and fatigue.  HENT: Negative for congestion and drooling.   Eyes: Negative for pain.  Respiratory: Negative for cough and shortness of breath.   Cardiovascular: Negative for chest pain.  Gastrointestinal: Positive for nausea, vomiting and abdominal pain. Negative for diarrhea.  Genitourinary: Negative for dysuria and hematuria.  Musculoskeletal: Negative for back pain, gait problem and neck pain.  Skin: Negative for color change.  Neurological: Negative for dizziness and headaches.  Hematological: Negative for adenopathy.  Psychiatric/Behavioral: Negative for behavioral problems.  All other systems reviewed and are negative.     Allergies  Penicillins and Sulfa antibiotics  Home Medications   Prior to Admission medications   Medication Sig Start Date End Date Taking? Authorizing Provider  acyclovir cream (ZOVIRAX) 5 % Apply 1 application topically every 3 (three) hours. 11/22/12   Lonie Peak Dixon, PA-C  calcium-vitamin D (OSCAL WITH D) 500-200 MG-UNIT per tablet Take 1 tablet by mouth daily.     Historical Provider, MD  Cholecalciferol (VITAMIN D) 1000 UNITS capsule Take 2,000 Units by mouth daily.     Historical Provider, MD  citalopram (CELEXA) 20 MG tablet Take 1 tablet (20 mg total) by mouth daily. 08/26/13   Lonie Peak Dixon, PA-C  fish oil-omega-3 fatty acids 1000 MG capsule Take 2 g by mouth daily.    Historical Provider, MD  fluticasone (FLONASE) 50 MCG/ACT nasal spray Place 2 sprays into both nostrils daily. 05/02/13   Lonie Peak Dixon, PA-C  Glucos-MSM-C-Mn-Ginger-Willow (GLUCOSAMINE MSM COMPLEX) TABS Take 1 tablet by mouth daily.     Historical Provider, MD  Linaclotide Rolan Lipa) 290 MCG CAPS capsule Take 290 mcg by mouth daily.    Historical Provider, MD  loratadine (CLARITIN) 10 MG tablet  Take 10 mg by mouth daily.      Historical Provider, MD  Lysine 500 MG TABS Take 1 tablet by mouth daily.     Historical Provider, MD  meloxicam (MOBIC) 7.5 MG tablet Take 1 tablet (7.5 mg total) by mouth daily. 08/26/13   Lonie Peak Dixon, PA-C  MOVIPREP 100 G SOLR Take 1 kit (200 g total) by mouth once. 10/03/13   Milus Banister, MD  Multiple Vitamins-Minerals (MULTIVITAMIN,TX-MINERALS) tablet Take 1 tablet by mouth daily.      Historical Provider, MD  ondansetron (ZOFRAN) 8 MG tablet Take 1 tablet (8 mg total) by mouth 3 (three) times daily. 09/21/13   Lonie Peak Dixon, PA-C  polyethylene glycol (MIRALAX / GLYCOLAX) packet Take 17 g by mouth daily. 09/20/13   Donne Hazel, MD  valACYclovir (VALTREX) 1000 MG tablet Take 2,000 mg by mouth 2 (two) times daily as needed (for 2 doses for flare ups).    Historical Provider, MD  vitamin E 400 UNIT capsule Take 400 Units by mouth daily.      Historical Provider, MD   BP 129/87  Pulse 103  Temp(Src) 98.4 F (36.9 C) (Oral)  Resp 20  Ht '5\' 11"'  (1.803 m)  Wt 230 lb (104.327 kg)  BMI 32.09 kg/m2  SpO2 100% Physical Exam  Nursing note and vitals reviewed. Constitutional: She is oriented to person, place, and time. She appears well-developed and well-nourished.  HENT:  Head: Normocephalic and atraumatic.  Mouth/Throat: Oropharynx is clear and moist. No oropharyngeal exudate.  Eyes: Conjunctivae and EOM are normal. Pupils are equal, round, and reactive to light.  Neck: Normal range of motion. Neck supple.  Cardiovascular: Normal rate, regular rhythm, normal heart sounds and intact distal pulses.  Exam reveals no gallop and no friction rub.   No murmur heard. Pulmonary/Chest: Effort normal and breath sounds normal. No respiratory distress. She has no wheezes.  Abdominal: Soft. Bowel sounds are normal. There is tenderness (mild ttp of epig and RUQ area). There is no rebound and no guarding.  Musculoskeletal: Normal range of motion. She exhibits no edema and  no tenderness.  Neurological: She is alert and oriented to person, place, and time.  Skin: Skin is warm and dry.  Psychiatric: She has a normal mood and affect. Her behavior is normal.    ED Course  Procedures (including critical care time) Labs Review Labs Reviewed  COMPREHENSIVE METABOLIC PANEL - Abnormal; Notable for the following:    Glucose, Bld 131 (*)    GFR calc non Af Amer 79 (*)    All other components within normal limits  URINALYSIS, ROUTINE W REFLEX MICROSCOPIC - Abnormal; Notable for the following:    Leukocytes, UA TRACE (*)    All other components within normal limits  URINE MICROSCOPIC-ADD ON - Abnormal; Notable for the following:    Bacteria, UA FEW (*)  Casts HYALINE CASTS (*)    All other components within normal limits  CBC WITH DIFFERENTIAL  LIPASE, BLOOD  LACTIC ACID, PLASMA  I-STAT CG4 LACTIC ACID, ED    Imaging Review Dg Abd 2 Views  10/09/2013   CLINICAL DATA:  Mid epigastric abdominal pain and nausea  EXAM: ABDOMEN - 2 VIEW  COMPARISON:  Prior CT abdomen/ pelvis 09/17/2013  FINDINGS: The lung bases are clear. The bowel gas pattern is not obstructed. No free air. Unremarkable colonic stool burden. No organomegaly, evidence of ascites or abnormal calcifications. No acute osseous abnormality.  IMPRESSION: Negative.   Electronically Signed   By: Jacqulynn Cadet M.D.   On: 10/09/2013 09:10     EKG Interpretation None      MDM   Final diagnoses:  Pain of upper abdomen  Nausea    7:01 AM 58 y.o. female who presents with nausea, retching, and upper abdominal cramping which began suddenly at 3 AM this morning. Of note the patient was admitted earlier in July 2015 for colitis and intractable abdominal pain. She had a noncontributory workup it was thought that her symptoms may be due to constipation. She has since followed up with Dr. Ardis Hughs with GI. His notes show that he also agrees that her symptoms are likely due to constipation. She denies any  fevers or bloody stools. She has had nausea but no emesis. She states that previously she had pain in the left lower quadrant but today it is in the upper abdomen in the epigastric and right upper quadrant area. VS unremarkable. Labs sent prior to my evaluation which are noncontributory. Will try to get symptomatic control.   9:35 AM: I interpreted/reviewed the labs and/or imaging which were non-contributory.  Pt's pain improved. No more nausea. Pt would like to go home.  I have discussed the diagnosis/risks/treatment options with the patient and believe the pt to be eligible for discharge home to follow-up with her pcp as needed and GI tomorrow as scheduled. Bowel prep at her own discretion based on how she is feeling. We also discussed returning to the ED immediately if new or worsening sx occur. We discussed the sx which are most concerning (e.g., worsening pain, fever, vomiting, inability to tol po) that necessitate immediate return. Medications administered to the patient during their visit and any new prescriptions provided to the patient are listed below.  Medications given during this visit Medications  ondansetron (ZOFRAN) injection 4 mg (4 mg Intravenous Given 10/09/13 0627)  sodium chloride 0.9 % bolus 1,000 mL (0 mLs Intravenous Stopped 10/09/13 0723)  HYDROmorphone (DILAUDID) injection 1 mg (1 mg Intravenous Given 10/09/13 0723)  HYDROmorphone (DILAUDID) injection 1 mg (1 mg Intravenous Given 10/09/13 0843)    New Prescriptions   HYDROCODONE-ACETAMINOPHEN (NORCO/VICODIN) 5-325 MG PER TABLET    Take 1-2 tablets by mouth every 6 (six) hours as needed for moderate pain or severe pain.     Blanchard Kelch, MD 10/09/13 [phone]

## 2013-10-09 NOTE — ED Notes (Signed)
Pt reports awoke from sleep by abd cramping N/V that now is dry heavies that comes in waves denies diarrhea tonight but bowels have been lose for last 2 weeks. Pt is schedule to start bowel prep today for colonoscopy this monday

## 2013-10-09 NOTE — ED Provider Notes (Signed)
CSN: [id number]     Arrival date & time 10/09/13  1705 History  This chart was scribed for Virgel Manifold, MD by Irene Pap, ED Scribe. This patient was seen in room MH02/MH02 and patient care was started at 6:17 PM.   Chief Complaint  Patient presents with  . Emesis   Patient is a 58 y.o. female presenting with vomiting. The history is provided by the patient. No language interpreter was used.  Emesis Associated symptoms: abdominal pain, chills and diarrhea    HPI Comments: Monique Gonzalez is a 58 y.o. female with a history of constipation who presents to the Emergency Department complaining of abdominal pain and emesis onset 15 hours ago. Patient states that she was seen earlier this morning for same symptoms but states that it has since worsened. She reports a history of similar episodes.  She states that she saw Dr. Ardis Hughs at [facility] and was supposed to have a colonoscopy but has yet to have this performed. She states that she called the doctor on call at [facility] who directed her back to the ED. She reports waxing and waning, cramping abdominal pain that is worse on the left side. No context as to why this is happening. She states that the diarrhea was onset this afternoon, and abdominal pain and vomiting worsened from this morning. She reports associated chills. She denies fever, bladder incontinence, sick contacts, or hematochezia. Denies history of abdominal surgeries. She states that she has been taking linzess and Miralax. She states that she was in the hospital 2 weeks ago and was given anti-biotics for 4 days after discharge.   Past Medical History  Diagnosis Date  . Seasonal allergies   . Osteoarthritis   . Depression   . Hyperlipidemia   . Recurrent cold sores   . Syncope and collapse 11/30/2012  . Vasovagal syncope    Past Surgical History  Procedure Laterality Date  . Tonsillectomy     Family History  Problem Relation Age of Onset  . Sudden death Neg Hx   . Hypertension Neg  Hx   . Hyperlipidemia Neg Hx   . Heart attack Neg Hx   . Diabetes Neg Hx   . Stroke Mother   . Arthritis Mother   . Cancer Father   . Arthritis Sister   . Cancer Maternal Grandmother   . Stroke Maternal Grandfather   . Cancer Maternal Grandfather   . Cancer Paternal Grandmother   . Heart disease Paternal Grandmother   . Stroke Paternal Grandfather   . Arthritis Sister    History  Substance Use Topics  . Smoking status: Former Research scientist (life sciences)  . Smokeless tobacco: Never Used  . Alcohol Use: Yes     Comment: one drink per week   OB History   Grav Para Term Preterm Abortions TAB SAB Ect Mult Living                 Review of Systems  Constitutional: Positive for chills. Negative for fever.  Gastrointestinal: Positive for nausea, vomiting, abdominal pain and diarrhea. Negative for blood in stool.  Genitourinary: Negative for difficulty urinating.  All other systems reviewed and are negative.  Allergies  Penicillins and Sulfa antibiotics  Home Medications   Prior to Admission medications   Medication Sig Start Date End Date Taking? Authorizing Provider  acyclovir cream (ZOVIRAX) 5 % Apply 1 application topically every 3 (three) hours. 11/22/12   Lonie Peak Dixon, PA-C  calcium-vitamin D (OSCAL WITH D) 500-200 MG-UNIT per tablet  Take 1 tablet by mouth daily.     Historical Provider, MD  Cholecalciferol (VITAMIN D) 1000 UNITS capsule Take 2,000 Units by mouth daily.     Historical Provider, MD  citalopram (CELEXA) 20 MG tablet Take 1 tablet (20 mg total) by mouth daily. 08/26/13   Patriciaann Clan Dixon, PA-C  fish oil-omega-3 fatty acids 1000 MG capsule Take 2 g by mouth daily.    Historical Provider, MD  fluticasone (FLONASE) 50 MCG/ACT nasal spray Place 2 sprays into both nostrils daily. 05/02/13   Patriciaann Clan Dixon, PA-C  Glucos-MSM-C-Mn-Ginger-Willow (GLUCOSAMINE MSM COMPLEX) TABS Take 1 tablet by mouth daily.     Historical Provider, MD  HYDROcodone-acetaminophen (NORCO/VICODIN) 5-325 MG per tablet  Take 1-2 tablets by mouth every 6 (six) hours as needed for moderate pain or severe pain. 10/09/13   Junius Argyle, MD  Linaclotide (LINZESS) 290 MCG CAPS capsule Take 290 mcg by mouth daily.    Historical Provider, MD  loratadine (CLARITIN) 10 MG tablet Take 10 mg by mouth daily.      Historical Provider, MD  Lysine 500 MG TABS Take 1 tablet by mouth daily.     Historical Provider, MD  meloxicam (MOBIC) 7.5 MG tablet Take 1 tablet (7.5 mg total) by mouth daily. 08/26/13   Patriciaann Clan Dixon, PA-C  MOVIPREP 100 G SOLR Take 1 kit (200 g total) by mouth once. 10/03/13   Rachael Fee, MD  Multiple Vitamins-Minerals (MULTIVITAMIN,TX-MINERALS) tablet Take 1 tablet by mouth daily.      Historical Provider, MD  ondansetron (ZOFRAN) 8 MG tablet Take 1 tablet (8 mg total) by mouth 3 (three) times daily. 09/21/13   Patriciaann Clan Dixon, PA-C  polyethylene glycol (MIRALAX / GLYCOLAX) packet Take 17 g by mouth daily. 09/20/13   Jerald Kief, MD  valACYclovir (VALTREX) 1000 MG tablet Take 2,000 mg by mouth 2 (two) times daily as needed (for 2 doses for flare ups).    Historical Provider, MD  vitamin E 400 UNIT capsule Take 400 Units by mouth daily.      Historical Provider, MD   BP 136/76  Pulse 76  Temp(Src) 98.7 F (37.1 C) (Oral)  Resp 21  Wt 230 lb (104.327 kg) Physical Exam  Nursing note and vitals reviewed. Constitutional: She appears well-developed and well-nourished. No distress.  HENT:  Head: Normocephalic and atraumatic.  Eyes: Conjunctivae are normal. Right eye exhibits no discharge. Left eye exhibits no discharge.  Neck: Neck supple.  Cardiovascular: Normal rate, regular rhythm and normal heart sounds.  Exam reveals no gallop and no friction rub.   No murmur heard. Pulmonary/Chest: Effort normal and breath sounds normal. No respiratory distress.  Abdominal: Soft. She exhibits no distension. There is no rebound and no guarding.  Mild diffuse tenderness.   Musculoskeletal: She exhibits no edema and  no tenderness.  Neurological: She is alert.  Skin: Skin is warm and dry.  Psychiatric: She has a normal mood and affect. Her behavior is normal. Thought content normal.    ED Course  Procedures (including critical care time) DIAGNOSTIC STUDIES: Oxygen Saturation is 98% on room air, normal by my interpretation.    COORDINATION OF CARE: 6:28 PM-Discussed treatment plan which includes pain medication with pt at bedside and pt agreed to plan.   Imaging Review Dg Abd 2 Views  10/09/2013   CLINICAL DATA:  Mid epigastric abdominal pain and nausea  EXAM: ABDOMEN - 2 VIEW  COMPARISON:  Prior CT abdomen/ pelvis 09/17/2013  FINDINGS:  The lung bases are clear. The bowel gas pattern is not obstructed. No free air. Unremarkable colonic stool burden. No organomegaly, evidence of ascites or abnormal calcifications. No acute osseous abnormality.  IMPRESSION: Negative.   Electronically Signed   By: Jacqulynn Cadet M.D.   On: 10/09/2013 09:10   Results for orders placed during the hospital encounter of 10/09/13  COMPREHENSIVE METABOLIC PANEL      Result Value Ref Range   Sodium 142  137 - 147 mEq/L   Potassium 4.1  3.7 - 5.3 mEq/L   Chloride 103  96 - 112 mEq/L   CO2 25  19 - 32 mEq/L   Glucose, Bld 131 (*) 70 - 99 mg/dL   BUN 19  6 - 23 mg/dL   Creatinine, Ser 0.80  0.50 - 1.10 mg/dL   Calcium 10.0  8.4 - 10.5 mg/dL   Total Protein 6.9  6.0 - 8.3 g/dL   Albumin 3.6  3.5 - 5.2 g/dL   AST 13  0 - 37 U/L   ALT 12  0 - 35 U/L   Alkaline Phosphatase 51  39 - 117 U/L   Total Bilirubin 0.3  0.3 - 1.2 mg/dL   GFR calc non Af Amer 79 (*) >90 mL/min   GFR calc Af Amer >90  >90 mL/min   Anion gap 14  5 - 15  CBC WITH DIFFERENTIAL      Result Value Ref Range   WBC 8.7  4.0 - 10.5 K/uL   RBC 4.10  3.87 - 5.11 MIL/uL   Hemoglobin 12.3  12.0 - 15.0 g/dL   HCT 36.3  36.0 - 46.0 %   MCV 88.5  78.0 - 100.0 fL   MCH 30.0  26.0 - 34.0 pg   MCHC 33.9  30.0 - 36.0 g/dL   RDW 12.6  11.5 - 15.5 %   Platelets  276  150 - 400 K/uL   Neutrophils Relative % 73  43 - 77 %   Neutro Abs 6.3  1.7 - 7.7 K/uL   Lymphocytes Relative 16  12 - 46 %   Lymphs Abs 1.4  0.7 - 4.0 K/uL   Monocytes Relative 11  3 - 12 %   Monocytes Absolute 0.9  0.1 - 1.0 K/uL   Eosinophils Relative 0  0 - 5 %   Eosinophils Absolute 0.0  0.0 - 0.7 K/uL   Basophils Relative 0  0 - 1 %   Basophils Absolute 0.0  0.0 - 0.1 K/uL  URINALYSIS, ROUTINE W REFLEX MICROSCOPIC      Result Value Ref Range   Color, Urine YELLOW  YELLOW   APPearance CLEAR  CLEAR   Specific Gravity, Urine 1.019  1.005 - 1.030   pH 6.5  5.0 - 8.0   Glucose, UA NEGATIVE  NEGATIVE mg/dL   Hgb urine dipstick NEGATIVE  NEGATIVE   Bilirubin Urine NEGATIVE  NEGATIVE   Ketones, ur NEGATIVE  NEGATIVE mg/dL   Protein, ur NEGATIVE  NEGATIVE mg/dL   Urobilinogen, UA 1.0  0.0 - 1.0 mg/dL   Nitrite NEGATIVE  NEGATIVE   Leukocytes, UA TRACE (*) NEGATIVE  LIPASE, BLOOD      Result Value Ref Range   Lipase 33  11 - 59 U/L  URINE MICROSCOPIC-ADD ON      Result Value Ref Range   Squamous Epithelial / LPF RARE  RARE   WBC, UA 0-2  <3 WBC/hpf   Bacteria, UA FEW (*) RARE  Casts HYALINE CASTS (*) NEGATIVE   Urine-Other MUCOUS PRESENT    I-STAT CG4 LACTIC ACID, ED      Result Value Ref Range   Lactic Acid, Venous 1.76  0.5 - 2.2 mmol/L   Ct Abdomen Pelvis W Contrast  10/09/2013   CLINICAL DATA:  Emesis.  EXAM: CT ABDOMEN AND PELVIS WITH CONTRAST  TECHNIQUE: Multidetector CT imaging of the abdomen and pelvis was performed using the standard protocol following bolus administration of intravenous contrast.  CONTRAST:  113m OMNIPAQUE IOHEXOL 300 MG/ML  SOLN  COMPARISON:  09/17/2013  FINDINGS: BODY WALL: Unremarkable.  LOWER CHEST: Unremarkable.  ABDOMEN/PELVIS:  Liver: No focal abnormality.  Biliary: No evidence of biliary obstruction or stone.  Pancreas: Unremarkable.  Spleen: Unremarkable.  Adrenals: Unremarkable.  Kidneys and ureters: No hydronephrosis or stone.  Bladder:  Unremarkable.  Reproductive: Unremarkable.  Bowel: Circumferential thickening of the colon centered at the splenic flexure secondary to submucosal edema. Inflammatory changes are less extensive than previous. No diverticula in this region. No evidence of bowel necrosis or perforation. The major mesenteric arteries and veins are patent. No significant atherosclerotic change for age. No proximal obstruction. Negative appendix.  Retroperitoneum: No mass or adenopathy.  Peritoneum: Small volume pelvic ascites which is likely reactive.  Vascular: No acute abnormality.  OSSEOUS: Lower lumbar degenerative facet disease with mild anterolisthesis at L4-5. There is focally advanced degenerative disc narrowing at L5-S1.  IMPRESSION: Nonspecific colitis centered at the splenic flexure.   Electronically Signed   By: JJorje GuildM.D.   On: 10/09/2013 21:30   Dg Abd 2 Views  10/09/2013   CLINICAL DATA:  Mid epigastric abdominal pain and nausea  EXAM: ABDOMEN - 2 VIEW  COMPARISON:  Prior CT abdomen/ pelvis 09/17/2013  FINDINGS: The lung bases are clear. The bowel gas pattern is not obstructed. No free air. Unremarkable colonic stool burden. No organomegaly, evidence of ascites or abnormal calcifications. No acute osseous abnormality.  IMPRESSION: Negative.   Electronically Signed   By: HJacqulynn CadetM.D.   On: 10/09/2013 09:10    EKG Interpretation None      MDM   Final diagnoses:  Colitis  Nausea vomiting and diarrhea    58yF with abdominal pain and n/v. CT with colitis, but appearance improved from prior scan. Labs from earlier today reviewed and unremarkable. Will tx for possible infectious etiology. Second ED visit within past 24 hours for same. Symptomatically not much better. Will discuss with medicine.    I personally preformed the services scribed in my presence. The recorded information has been reviewed is accurate. SVirgel Manifold MD.    SVirgel Manifold MD 10/09/13 [phone]

## 2013-10-10 ENCOUNTER — Encounter: Payer: 59 | Admitting: Gastroenterology

## 2013-10-10 ENCOUNTER — Telehealth: Payer: Self-pay | Admitting: Gastroenterology

## 2013-10-10 ENCOUNTER — Encounter (HOSPITAL_COMMUNITY): Payer: Self-pay

## 2013-10-10 DIAGNOSIS — R112 Nausea with vomiting, unspecified: Secondary | ICD-10-CM

## 2013-10-10 DIAGNOSIS — F3289 Other specified depressive episodes: Secondary | ICD-10-CM

## 2013-10-10 DIAGNOSIS — R933 Abnormal findings on diagnostic imaging of other parts of digestive tract: Secondary | ICD-10-CM

## 2013-10-10 DIAGNOSIS — R197 Diarrhea, unspecified: Secondary | ICD-10-CM

## 2013-10-10 DIAGNOSIS — K5900 Constipation, unspecified: Secondary | ICD-10-CM

## 2013-10-10 DIAGNOSIS — F329 Major depressive disorder, single episode, unspecified: Secondary | ICD-10-CM

## 2013-10-10 DIAGNOSIS — E785 Hyperlipidemia, unspecified: Secondary | ICD-10-CM

## 2013-10-10 DIAGNOSIS — K5289 Other specified noninfective gastroenteritis and colitis: Secondary | ICD-10-CM

## 2013-10-10 LAB — BASIC METABOLIC PANEL
ANION GAP: 11 (ref 5–15)
BUN: 13 mg/dL (ref 6–23)
CALCIUM: 8.3 mg/dL — AB (ref 8.4–10.5)
CO2: 23 mEq/L (ref 19–32)
Chloride: 105 mEq/L (ref 96–112)
Creatinine, Ser: 0.57 mg/dL (ref 0.50–1.10)
GFR calc Af Amer: >90 mL/min (ref >90)
Glucose, Bld: 110 mg/dL — ABNORMAL HIGH (ref 70–99)
POTASSIUM: 4.3 meq/L (ref 3.7–5.3)
Sodium: 139 mEq/L (ref 137–147)

## 2013-10-10 LAB — CBC
HCT: 32.4 % — ABNORMAL LOW (ref 36.0–46.0)
Hemoglobin: 10.6 g/dL — ABNORMAL LOW (ref 12.0–15.0)
MCH: 28.9 pg (ref 26.0–34.0)
MCHC: 32.7 g/dL (ref 30.0–36.0)
MCV: 88.3 fL (ref 78.0–100.0)
PLATELETS: 205 10*3/uL (ref 150–400)
RBC: 3.67 MIL/uL — AB (ref 3.87–5.11)
RDW: 13 % (ref 11.5–15.5)
WBC: 8.3 10*3/uL (ref 4.0–10.5)

## 2013-10-10 MED ORDER — ACETAMINOPHEN 325 MG PO TABS
650.0000 mg | ORAL_TABLET | Freq: Four times a day (QID) | ORAL | Status: DC | PRN
  Administered 2013-10-10 (×2): 650 mg via ORAL
  Filled 2013-10-10 (×2): qty 2

## 2013-10-10 MED ORDER — ONDANSETRON HCL 4 MG/2ML IJ SOLN
4.0000 mg | Freq: Four times a day (QID) | INTRAMUSCULAR | Status: DC | PRN
  Administered 2013-10-13 (×2): 4 mg via INTRAVENOUS
  Filled 2013-10-10 (×2): qty 2

## 2013-10-10 MED ORDER — SODIUM CHLORIDE 0.9 % IV SOLN
INTRAVENOUS | Status: DC
  Administered 2013-10-10: 01:00:00 via INTRAVENOUS

## 2013-10-10 MED ORDER — OMEGA-3-ACID ETHYL ESTERS 1 G PO CAPS
1.0000 g | ORAL_CAPSULE | Freq: Every day | ORAL | Status: DC
  Administered 2013-10-10 – 2013-10-20 (×8): 1 g via ORAL
  Filled 2013-10-10 (×12): qty 1

## 2013-10-10 MED ORDER — METOCLOPRAMIDE HCL 5 MG/ML IJ SOLN
5.0000 mg | Freq: Three times a day (TID) | INTRAMUSCULAR | Status: DC
  Administered 2013-10-10 – 2013-10-11 (×5): 5 mg via INTRAVENOUS
  Filled 2013-10-10 (×3): qty 2
  Filled 2013-10-10 (×6): qty 1

## 2013-10-10 MED ORDER — SODIUM CHLORIDE 0.9 % IV SOLN
INTRAVENOUS | Status: DC
  Administered 2013-10-10 – 2013-10-14 (×6): via INTRAVENOUS

## 2013-10-10 MED ORDER — ALUM & MAG HYDROXIDE-SIMETH 200-200-20 MG/5ML PO SUSP: 30.0000 mL | Freq: Four times a day (QID) | ORAL | Status: DC | PRN

## 2013-10-10 MED ORDER — PEG 3350-KCL-NA BICARB-NACL 420 G PO SOLR
4000.0000 mL | Freq: Once | ORAL | Status: AC
  Administered 2013-10-10: 4000 mL via ORAL
  Filled 2013-10-10: qty 4000

## 2013-10-10 MED ORDER — OXYCODONE HCL 5 MG PO TABS
5.0000 mg | ORAL_TABLET | ORAL | Status: DC | PRN
  Administered 2013-10-10 – 2013-10-20 (×17): 5 mg via ORAL
  Filled 2013-10-10 (×18): qty 1

## 2013-10-10 MED ORDER — ONDANSETRON HCL 4 MG PO TABS: 4.0000 mg | ORAL_TABLET | Freq: Four times a day (QID) | ORAL | Status: DC | PRN

## 2013-10-10 MED ORDER — HYDROMORPHONE HCL PF 1 MG/ML IJ SOLN
0.5000 mg | INTRAMUSCULAR | Status: DC | PRN
  Administered 2013-10-13 – 2013-10-19 (×23): 1 mg via INTRAVENOUS
  Filled 2013-10-10 (×23): qty 1

## 2013-10-10 MED ORDER — ENOXAPARIN SODIUM 40 MG/0.4ML ~~LOC~~ SOLN
40.0000 mg | SUBCUTANEOUS | Status: DC
  Administered 2013-10-10: 40 mg via SUBCUTANEOUS
  Filled 2013-10-10 (×2): qty 0.4

## 2013-10-10 MED ORDER — ACETAMINOPHEN 650 MG RE SUPP: 650.0000 mg | Freq: Four times a day (QID) | RECTAL | Status: DC | PRN

## 2013-10-10 MED ORDER — FLUTICASONE PROPIONATE 50 MCG/ACT NA SUSP
2.0000 | Freq: Every day | NASAL | Status: DC
  Administered 2013-10-10 – 2013-10-20 (×9): 2 via NASAL
  Filled 2013-10-10: qty 16

## 2013-10-10 MED ORDER — CITALOPRAM HYDROBROMIDE 20 MG PO TABS
20.0000 mg | ORAL_TABLET | Freq: Every day | ORAL | Status: DC
  Administered 2013-10-10 – 2013-10-20 (×10): 20 mg via ORAL
  Filled 2013-10-10 (×12): qty 1

## 2013-10-10 NOTE — Progress Notes (Signed)
[facility] Progress Note   Monique Gonzalez KGM:[id number] DOB: [birth year] DOA: 10/09/2013 PCP: Odette Fraction, MD  Brief narrative: Monique Gonzalez is a 58 y.o. female  presents with vomiting and diarrhea after being started on antibiotics this weekend for abdominal pain and vomiting. CT on admission on 8/2 reveals sigmoid edema.    Subjective: Symptoms improved for now but abdomen still quite sore-  has not had any diarrhea since yesterday when she had 3 large liquid stool.  Assessment/Plan: Principal Problem:   Colitis? - have consulted GI as Dr Ardis Hughs saw the pt on 7/27 and wanted to perform a colonoscopy today due to colon wall thickening seen on CT - continue antibiotics (or d/c) per GI  Active Problems: Depression -cont citalopram  Code Status: full code Family Communication: none Disposition Plan: home  Consultants: GI  Procedures: none  Antibiotics: Anti-infectives   Start     Dose/Rate Route Frequency Ordered Stop   10/09/13 2245  ciprofloxacin (CIPRO) IVPB 400 mg     400 mg 200 mL/hr over 60 Minutes Intravenous  Once 10/09/13 2232 10/10/13 0355   10/09/13 2245  metroNIDAZOLE (FLAGYL) IVPB 500 mg     500 mg 100 mL/hr over 60 Minutes Intravenous  Once 10/09/13 2232 10/10/13 0212       DVT prophylaxis: Lovenox  Objective: Filed Weights   10/09/13 1711 10/10/13 0023  Weight: 104.327 kg (230 lb) 102.7 kg (226 lb 6.6 oz)    Intake/Output Summary (Last 24 hours) at 10/10/13 1200 Last data filed at 10/10/13 1136  Gross per 24 hour  Intake   1760 ml  Output   1350 ml  Net    410 ml     Vitals Filed Vitals:   10/09/13 2314 10/10/13 0023 10/10/13 0444 10/10/13 1015  BP: 144/60 142/60 146/63 118/56  Pulse: 84 85 81 74  Temp: 98.8 F (37.1 C) 99 F (37.2 C) 98.8 F (37.1 C) 98.6 F (37 C)  TempSrc: Oral Oral Oral Oral  Resp: 20 16 16 16   Height:  5\' 10"  (1.778 m)    Weight:  102.7 kg (226 lb 6.6 oz)    SpO2: 98% 99% 99% 100%     Exam: General: No acute respiratory distress Lungs: Clear to auscultation bilaterally without wheezes or crackles Cardiovascular: Regular rate and rhythm without murmur gallop or rub normal S1 and S2 Abdomen: diffusely tender, nondistended, soft, bowel sounds positive, no rebound, no ascites, no appreciable mass Extremities: No significant cyanosis, clubbing, or edema bilateral lower extremities  Data Reviewed: Basic Metabolic Panel:  Recent Labs Lab 10/09/13 0615 10/10/13 0435  NA 142 139  K 4.1 4.3  CL 103 105  CO2 25 23  GLUCOSE 131* 110*  BUN 19 13  CREATININE 0.80 0.57  CALCIUM 10.0 8.3*   Liver Function Tests:  Recent Labs Lab 10/09/13 0615  AST 13  ALT 12  ALKPHOS 51  BILITOT 0.3  PROT 6.9  ALBUMIN 3.6    Recent Labs Lab 10/09/13 0615  LIPASE 33   No results found for this basename: AMMONIA,  in the last 168 hours CBC:  Recent Labs Lab 10/09/13 0615 10/10/13 0435  WBC 8.7 8.3  NEUTROABS 6.3  --   HGB 12.3 10.6*  HCT 36.3 32.4*  MCV 88.5 88.3  PLT 276 205   Cardiac Enzymes: No results found for this basename: CKTOTAL, CKMB, CKMBINDEX, TROPONINI,  in the last 168 hours BNP (last 3 results) No results found for this basename: PROBNP,  in  the last 8760 hours CBG: No results found for this basename: GLUCAP,  in the last 168 hours  No results found for this or any previous visit (from the past 240 hour(s)).   Studies:  Recent x-ray studies have been reviewed in detail by the Attending Physician  Scheduled Meds:  Scheduled Meds: . citalopram  20 mg Oral Daily  . enoxaparin (LOVENOX) injection  40 mg Subcutaneous Q24H  . fluticasone  2 spray Each Nare Daily  . omega-3 acid ethyl esters  1 g Oral Daily   Continuous Infusions: . sodium chloride 100 mL/hr at 10/10/13 0039  . sodium chloride Stopped (10/10/13 0930)    Time spent on care of this patient: 35 min   [place], MD 10/10/2013, 12:00 PM  LOS: 1 day   [facility] Office  [phone] Pager - Text Page per [email or web address]  If 7PM-7AM, please contact night-coverage [email or web address]

## 2013-10-10 NOTE — Consult Note (Signed)
[facility] Consult: 11:08 AM 10/10/2013  LOS: 1 day    Referring Provider: Dr Wynelle Cleveland Primary Care Physician:  Odette Fraction, MD Primary Gastroenterologist:  Dr. Ardis Hughs.      Reason for Consultation:  Colitis and abdominal pain, diarrhea, n/v.    HPI: Monique Gonzalez is a 58 y.o. female.  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 Has DDD/DJD and takes Mobic daily. Obesity though has dropped 80# in last 1.5 years with healthy diet.     Previous hx of constipation. Baseline was BM every 1 to 2 days, hard to evacuate. Would see minor blood with extensive pushing.  Dulcolax prn if no BM for 2 to 3 days. Started on Linzess one year ago. Resulted in less straining and mostly daily BMs. Still would have bouts with intermittent mid to upper abdominal pain, nausea/dry heaves.  If pain is bad enough she may get syncopal.   Previous screening colonoscopy 2007 was benign and done years before onset of her GI troubles.   Admission 7/11 - 7/14 with abdominal pain,nausea without vomiting, vaso-vagal syncope, hypokalemia.  CT 09/17/2013: Stool-filled colon. Wall thickening in the transverse and descending colon suggesting infectious or inflammatory colitis. Small amount of free fluid along the spleen, left pericolic gutter, and pelvis is likely related to the inflammatory colonic process.  CT angio 09/17/2013: IMPRESSION: 1. Grossly unchanged mild though rather diffuse wall thickening extending from the transverse colon to the mid descending colon.. no discrete intraluminal filling defects within the mesenteric vasculature to suggest embolism as a source of mesenteric ischemia, though note, the distribution of colonic wall thickening could be seen in the setting of a watershed ischemic episode in the setting of profound hypotension.  Clinical correlation is advised. In the absence of this clinical history, the colonic wall thickening is favored to be of either infectious and/or inflammatory etiology. 2. Grossly unchanged mesenteric stranding and fluid within the left pericolic gutter extending to the lower pelvis without definable/drainable fluid collection. Treated with cipro/flagyl soap suds enema, sorbital.  Stool pathogen panel was negative.  Managed to pass a lot of stool, discharged on Linzess, Miralax, colace.  Resulted in having explosive diarrrea.   Seen by Dr Ardis Hughs on 7/27 and set up for colonoscopy today. He advised her to stop the colace, which resulted in her having less urgent, loose stools.  However had recurrent attack of pain, N/V starting 3 AM yesterday brought her to ED and she was treated with IVF, antiemetics and anaelgesic, felt a little better.  At home pain and n/v and diarrhea recurred in the afternoon so she returned to ED and was admitted. Unable to keep down liquids so completing the Movi Prep was not going to happen.  CT 10/09/13: Nonspecific colitis centered at the splenic flexure.  Inflammatory changes are less extensive than previous. Labs reveal some drop in Hgb from ~12.5 to 10.5. WBCs not elevated.  Electrolytes ok. No fever.  Started on Cipro/Flagyl.  Felling better today, still some abdominal soreness and not hungry.  Has not vomited today. Stool is loose.  Past Medical History  Diagnosis Date  . Seasonal allergies   . Osteoarthritis   . Depression   . Hyperlipidemia   . Recurrent cold sores   . Syncope and collapse 11/30/2012    Normal EEG.  Vaso vagal syncope  . Colitis 09/2012    infectious vs inflammatory.     Past Surgical History  Procedure Laterality Date  . Tonsillectomy      Prior to Admission medications   Medication Sig Start Date End Date Taking? Authorizing Provider  calcium-vitamin D (OSCAL WITH D) 500-200 MG-UNIT per tablet Take 1 tablet by mouth daily.    Yes  Historical Provider, MD  Cholecalciferol (VITAMIN D) 1000 UNITS capsule Take 2,000 Units by mouth daily.    Yes Historical Provider, MD  citalopram (CELEXA) 20 MG tablet Take 1 tablet (20 mg total) by mouth daily. 08/26/13  Yes Mary B Dixon, PA-C  fish oil-omega-3 fatty acids 1000 MG capsule Take 2 g by mouth daily.   Yes Historical Provider, MD  fluticasone (FLONASE) 50 MCG/ACT nasal spray Place 2 sprays into both nostrils daily. 05/02/13  Yes Mary B Dixon, PA-C  Glucos-MSM-C-Mn-Ginger-Willow (GLUCOSAMINE MSM COMPLEX) TABS Take 1 tablet by mouth daily.    Yes Historical Provider, MD  HYDROcodone-acetaminophen (NORCO/VICODIN) 5-325 MG per tablet Take 1-2 tablets by mouth every 6 (six) hours as needed for moderate pain or severe pain. 10/09/13  Yes Blanchard Kelch, MD  Linaclotide (LINZESS) 290 MCG CAPS capsule Take 290 mcg by mouth daily.   Yes Historical Provider, MD  loratadine (CLARITIN) 10 MG tablet Take 10 mg by mouth daily.     Yes Historical Provider, MD  Lysine 500 MG TABS Take 500 mg by mouth daily.    Yes Historical Provider, MD  meloxicam (MOBIC) 7.5 MG tablet Take 1 tablet (7.5 mg total) by mouth daily. 08/26/13  Yes Mary B Dixon, PA-C  Multiple Vitamins-Minerals (MULTIVITAMIN,TX-MINERALS) tablet Take 1 tablet by mouth daily.     Yes Historical Provider, MD  ondansetron (ZOFRAN) 8 MG tablet Take 1 tablet (8 mg total) by mouth 3 (three) times daily. 09/21/13  Yes Mary B Dixon, PA-C  polyethylene glycol (MIRALAX / GLYCOLAX) packet Take 17 g by mouth daily. 09/20/13  Yes Donne Hazel, MD  vitamin E 400 UNIT capsule Take 400 Units by mouth daily.     Yes Historical Provider, MD  acyclovir cream (ZOVIRAX) 5 % Apply 1 application topically every 3 (three) hours. 11/22/12   Lonie Peak Dixon, PA-C  MOVIPREP 100 G SOLR Take 1 kit (200 g total) by mouth once. 10/03/13   Milus Banister, MD  valACYclovir (VALTREX) 1000 MG tablet Take 2,000 mg by mouth 2 (two) times daily as needed (for 2 doses for flare  ups).    Historical Provider, MD    Scheduled Meds: . citalopram  20 mg Oral Daily  . enoxaparin (LOVENOX) injection  40 mg Subcutaneous Q24H  . fluticasone  2 spray Each Nare Daily  . omega-3 acid ethyl esters  1 g Oral Daily   Infusions: . sodium chloride 100 mL/hr at 10/10/13 0039  . sodium chloride Stopped (10/10/13 0930)   PRN Meds: acetaminophen, acetaminophen, alum & mag hydroxide-simeth, HYDROmorphone (DILAUDID) injection, ondansetron (ZOFRAN) IV, ondansetron, oxyCODONE   Allergies as of 10/09/2013 - Review Complete 10/09/2013  Allergen Reaction Noted  . Penicillins Rash 09/23/2010  . Sulfa antibiotics Rash 09/23/2010    Family History  Problem Relation Age of Onset  . Sudden death Neg Hx   .  Hypertension Neg Hx   . Hyperlipidemia Neg Hx   . Heart attack Neg Hx   . Diabetes Neg Hx   . Stroke Mother   . Arthritis Mother   . Cancer Father   . Arthritis Sister   . Cancer Maternal Grandmother   . Stroke Maternal Grandfather   . Cancer Maternal Grandfather   . Cancer Paternal Grandmother   . Heart disease Paternal Grandmother   . Stroke Paternal Grandfather   . Arthritis Sister     History   Social History  . Marital Status: Divorced    Spouse Name: N/A    Number of Children: 2  . Years of Education: college   Occupational History  . [phone]     LPN   Social History Main Topics  . Smoking status: Former Research scientist (life sciences)  . Smokeless tobacco: Never Used  . Alcohol Use: Yes     Comment: one drink per week  . Drug Use: No  . Sexual Activity: Not on file   Other Topics Concern  . Not on file   Social History Narrative  . No narrative on file    REVIEW OF SYSTEMS: Constitutional:  80 # weight loss in last 12 to 18 months: intentional with healthy diet.  ENT:  No nose bleeds Pulm:  No cough or dyspnea CV:  No palpitations, no LE edema.  GU:  No hematuria, no frequency GI:  Per HPI Heme:  No unusual bruising or excessive bleeding.    Transfusions:   None.  Neuro:  No headaches, no peripheral tingling or numbness Derm:  No itching, no rash or sores.  Endocrine:  No sweats or chills.  No polyuria or dysuria Immunization:  Did not inquire Travel:  None beyond local counties in last few months.    PHYSICAL EXAM: Vital signs in last 24 hours: Filed Vitals:   10/10/13 0444  BP: 146/63  Pulse: 81  Temp: 98.8 F (37.1 C)  Resp: 16   Wt Readings from Last 3 Encounters:  10/10/13 102.7 kg (226 lb 6.6 oz)  10/09/13 104.327 kg (230 lb)  10/03/13 104.327 kg (230 lb)   General: pleasant, pale, mildly unwell but uncomfortable WF, looks youner than stated age Head:  No swelling or asymmetry  Eyes:  No icterus or pallor.  EOMI.  Ears:  Not HOH  Nose:  No congestion, no discharge Mouth:  Clear, moist.  Neck:  No mass, no JVD, no bruits Lungs:  Clear bil.  Quiet breathing Heart: RRR.  No MRG Abdomen:  Soft, ND, active BS.  Tender in upper abdomen: mild and not focal.  No mass, no bruits, no HSM.   Rectal: deferred   Musc/Skeltl: no joint swelling or deformity Extremities:  No CCE.   Neurologic:  Oriented x 3.  Good historian.  No tremor, no limb weakness Skin:  No telangectasia, rash, sores Tattoos:  none Nodes:  No cervical adenopathy.    Psych:  Cooperative, relaxed, pleasant.   Intake/Output from previous day: 08/02 0701 - 08/03 0700 In: 1200 [I.V.:1000; IV Piggyback:200] Out: 450 [Urine:450] Intake/Output this shift: Total I/O In: 440 [P.O.:440] Out: 400 [Urine:400]  LAB RESULTS:  Recent Labs  10/09/13 0615 10/10/13 0435  WBC 8.7 8.3  HGB 12.3 10.6*  HCT 36.3 32.4*  PLT 276 205  MCV     88  BMET Lab Results  Component Value Date   NA 139 10/10/2013   NA 142 10/09/2013   NA 141 09/18/2013   K 4.3  10/10/2013   K 4.1 10/09/2013   K 4.2 09/18/2013   CL 105 10/10/2013   CL 103 10/09/2013   CL 104 09/18/2013   CO2 23 10/10/2013   CO2 25 10/09/2013   CO2 25 09/18/2013   GLUCOSE 110* 10/10/2013   GLUCOSE 131* 10/09/2013    GLUCOSE 112* 09/18/2013   BUN 13 10/10/2013   BUN 19 10/09/2013   BUN 22 09/18/2013   CREATININE 0.57 10/10/2013   CREATININE 0.80 10/09/2013   CREATININE 0.86 09/18/2013   CALCIUM 8.3* 10/10/2013   CALCIUM 10.0 10/09/2013   CALCIUM 8.5 09/18/2013   LFT  Recent Labs  10/09/13 0615  PROT 6.9  ALBUMIN 3.6  AST 13  ALT 12  ALKPHOS 51  BILITOT 0.3    RADIOLOGY STUDIES: Ct Abdomen Pelvis W Contrast 10/09/2013   COMPARISON:  09/17/2013  FINDINGS: BODY WALL: Unremarkable.  LOWER CHEST: Unremarkable.  ABDOMEN/PELVIS:  Liver: No focal abnormality.  Biliary: No evidence of biliary obstruction or stone.  Pancreas: Unremarkable.  Spleen: Unremarkable.  Adrenals: Unremarkable.  Kidneys and ureters: No hydronephrosis or stone.  Bladder: Unremarkable.  Reproductive: Unremarkable.  Bowel: Circumferential thickening of the colon centered at the splenic flexure secondary to submucosal edema. Inflammatory changes are less extensive than previous. No diverticula in this region. No evidence of bowel necrosis or perforation. The major mesenteric arteries and veins are patent. No significant atherosclerotic change for age. No proximal obstruction. Negative appendix.  Retroperitoneum: No mass or adenopathy.  Peritoneum: Small volume pelvic ascites which is likely reactive.  Vascular: No acute abnormality.  OSSEOUS: Lower lumbar degenerative facet disease with mild anterolisthesis at L4-5. There is focally advanced degenerative disc narrowing at L5-S1.  IMPRESSION: Nonspecific colitis centered at the splenic flexure.   Electronically Signed   By: Jorje Guild M.D.   On: 10/09/2013 21:30   Dg Abd 2 Views 10/09/2013  COMPARISON:  Prior CT abdomen/ pelvis 09/17/2013  FINDINGS: The lung bases are clear. The bowel gas pattern is not obstructed. No free air. Unremarkable colonic stool burden. No organomegaly, evidence of ascites or abnormal calcifications. No acute osseous abnormality.  IMPRESSION: Negative.   Electronically Signed    By: Jacqulynn Cadet M.D.   On: 10/09/2013 09:10    ENDOSCOPIC STUDIES: Screening colonoscopy 2007, no pathology.  Not done locally.   IMPRESSION:   *  Colitis.  3 year intermittent bouts of pain, n/v/d.  The recurrent nature of episodes suggests IBD more than infectious.  Ischemic etiology due to watershed ischemic events is less likely.     PLAN:     * Pt would like to complete colonoscopy as inpt.  Not sure if she is ready to embark on prep tonight.    Azucena Freed  10/10/2013, 11:08 AM Pager: [phone]

## 2013-10-10 NOTE — H&P (Signed)
[facility] Admission History and Physical       Monique Gonzalez DEY:[id number] DOB: March 05, 1956 DOA: 10/09/2013  Referring physician:  PCP: Odette Fraction, MD  Specialists:   Chief Complaint:  ABD Pain Nausea and Vomiting and Diarrhea  HPI: Monique Gonzalez is a 58 y.o. female with a history of Hyperlipidemia, and Osteoarthritis who presented to the [facility] ED in the AM due to 2 days of Nausea, Vomiting, and Diarrhea. She denied havign any fevers or chills.   A CT scan of the ABD revealed inflammation and thickening of the colon at the splenic flexure.   She was discharged to home but returned  to the ED due to worsening of her symptoms.  She was not able to hold down foods or liquids or able to take her colon prep for her colonoscopy which was scheduled for the AM with Dr. Ardis Hughs  She was placed on IV Cipro and Flagyl and referred for admission to [facility].      Review of Systems:  Constitutional: No Weight Loss, No Weight Gain, Night Sweats, Fevers, Chills, Dizziness, Fatigue, or Generalized Weakness HEENT: No Headaches, Difficulty Swallowing,Tooth/Dental Problems,Sore Throat,  No Sneezing, Rhinitis, Ear Ache, Nasal Congestion, or Post Nasal Drip,  Cardio-vascular:  No Chest pain, Orthopnea, PND, Edema in Lower Extremities, Anasarca, Dizziness, Palpitations  Resp: No Dyspnea, No DOE, No Productive Cough, No Non-Productive Cough, No Hemoptysis, No Wheezing.    GI: No Heartburn, Indigestion, +Abdominal Pain, +Nausea, +Vomiting, +Diarrhea, Hematemesis, Hematochezia, Melena, Change in Bowel Habits,  Loss of Appetite  GU: No Dysuria, Change in Color of Urine, No Urgency or Frequency, No Flank pain.  Musculoskeletal: No Joint Pain or Swelling, No Decreased Range of Motion, No Back Pain.  Neurologic: No Syncope, No Seizures, Muscle Weakness, Paresthesia, Vision Disturbance or Loss, No Diplopia, No Vertigo, No Difficulty Walking,  Skin: No Rash or Lesions. Psych: No Change in Mood or Affect,  No Depression or Anxiety, No Memory loss, No Confusion, or Hallucinations   Past Medical History  Diagnosis Date  . Seasonal allergies   . Osteoarthritis   . Depression   . Hyperlipidemia   . Recurrent cold sores   . Syncope and collapse 11/30/2012  . Vasovagal syncope     Past Surgical History  Procedure Laterality Date  . Tonsillectomy       Prior to Admission medications   Medication Sig Start Date End Date Taking? Authorizing Provider  acyclovir cream (ZOVIRAX) 5 % Apply 1 application topically every 3 (three) hours. 11/22/12   Lonie Peak Dixon, PA-C  calcium-vitamin D (OSCAL WITH D) 500-200 MG-UNIT per tablet Take 1 tablet by mouth daily.     Historical Provider, MD  Cholecalciferol (VITAMIN D) 1000 UNITS capsule Take 2,000 Units by mouth daily.     Historical Provider, MD  citalopram (CELEXA) 20 MG tablet Take 1 tablet (20 mg total) by mouth daily. 08/26/13   Lonie Peak Dixon, PA-C  fish oil-omega-3 fatty acids 1000 MG capsule Take 2 g by mouth daily.    Historical Provider, MD  fluticasone (FLONASE) 50 MCG/ACT nasal spray Place 2 sprays into both nostrils daily. 05/02/13   Lonie Peak Dixon, PA-C  Glucos-MSM-C-Mn-Ginger-Willow (GLUCOSAMINE MSM COMPLEX) TABS Take 1 tablet by mouth daily.     Historical Provider, MD  HYDROcodone-acetaminophen (NORCO/VICODIN) 5-325 MG per tablet Take 1-2 tablets by mouth every 6 (six) hours as needed for moderate pain or severe pain. 10/09/13   Blanchard Kelch, MD  Linaclotide Rolan Lipa) 290 MCG CAPS capsule  Take 290 mcg by mouth daily.    Historical Provider, MD  loratadine (CLARITIN) 10 MG tablet Take 10 mg by mouth daily.      Historical Provider, MD  Lysine 500 MG TABS Take 1 tablet by mouth daily.     Historical Provider, MD  meloxicam (MOBIC) 7.5 MG tablet Take 1 tablet (7.5 mg total) by mouth daily. 08/26/13   Lonie Peak Dixon, PA-C  MOVIPREP 100 G SOLR Take 1 kit (200 g total) by mouth once. 10/03/13   Milus Banister, MD  Multiple Vitamins-Minerals  (MULTIVITAMIN,TX-MINERALS) tablet Take 1 tablet by mouth daily.      Historical Provider, MD  ondansetron (ZOFRAN) 8 MG tablet Take 1 tablet (8 mg total) by mouth 3 (three) times daily. 09/21/13   Lonie Peak Dixon, PA-C  polyethylene glycol (MIRALAX / GLYCOLAX) packet Take 17 g by mouth daily. 09/20/13   Donne Hazel, MD  valACYclovir (VALTREX) 1000 MG tablet Take 2,000 mg by mouth 2 (two) times daily as needed (for 2 doses for flare ups).    Historical Provider, MD  vitamin E 400 UNIT capsule Take 400 Units by mouth daily.      Historical Provider, MD      Allergies  Allergen Reactions  . Penicillins Rash  . Sulfa Antibiotics Rash     Social History:  reports that she has quit smoking. She has never used smokeless tobacco. She reports that she drinks alcohol. She reports that she does not use illicit drugs.     Family History  Problem Relation Age of Onset  . Sudden death Neg Hx   . Hypertension Neg Hx   . Hyperlipidemia Neg Hx   . Heart attack Neg Hx   . Diabetes Neg Hx   . Stroke Mother   . Arthritis Mother   . Cancer Father   . Arthritis Sister   . Cancer Maternal Grandmother   . Stroke Maternal Grandfather   . Cancer Maternal Grandfather   . Cancer Paternal Grandmother   . Heart disease Paternal Grandmother   . Stroke Paternal Grandfather   . Arthritis Sister        Physical Exam:  GEN:  Pleasant Obese 58 y.o. Caucasian female examined and in no acute distress; cooperative with exam Filed Vitals:   10/09/13 1851 10/09/13 2059 10/09/13 2314 10/10/13 0023  BP: 125/55 150/64 144/60 142/60  Pulse: 72 86 84 85  Temp: 98.3 F (36.8 C) 98.7 F (37.1 C) 98.8 F (37.1 C) 99 F (37.2 C)  TempSrc: Oral Oral Oral Oral  Resp: _0 Height:    _1  (1.778 m)  Weight:    102.7 kg (226 lb 6.6 oz)  SpO2: 96% 100% 98% 99%   Blood pressure 142/60, pulse 85, temperature 99 F (37.2 C), temperature source Oral, resp. rate 16, height _2  (1.778 m), weight 102.7 kg  (226 lb 6.6 oz), SpO2 99.00%. PSYCH: She is alert and oriented x4; does not appear anxious does not appear depressed; affect is normal HEENT: Normocephalic and Atraumatic, Mucous membranes pink; PERRLA; EOM intact; Fundi:  Benign;  No scleral icterus, Nares: Patent, Oropharynx: Clear, Fair Dentition, Neck:  FROM, No Cervical Lymphadenopathy nor Thyromegaly or Carotid Bruit; No JVD; Breasts:: Not examined CHEST WALL: No tenderness CHEST: Normal respiration, clear to auscultation bilaterally HEART: Regular rate and rhythm; no murmurs rubs or gallops BACK: No kyphosis or scoliosis; No CVA tenderness ABDOMEN: Positive Bowel Sounds, Scaphoid, Obese, Soft Non-Tender; No Masses,  No Organomegaly, No Pannus; No Intertriginous candida. Rectal Exam: Not done EXTREMITIES: No  Cyanosis, Clubbing, or Edema; No Ulcerations. Genitalia: not examined PULSES: 2+ and symmetric SKIN: Normal hydration no rash or ulceration CNS: Alert and Oriented x 4, No Focal Deficits Vascular: pulses palpable throughout    Labs on Admission:  Basic Metabolic Panel:  Recent Labs Lab 10/09/13 0615  NA 142  K 4.1  CL 103  CO2 25  GLUCOSE 131*  BUN 19  CREATININE 0.80  CALCIUM 10.0   Liver Function Tests:  Recent Labs Lab 10/09/13 0615  AST 13  ALT 12  ALKPHOS 51  BILITOT 0.3  PROT 6.9  ALBUMIN 3.6    Recent Labs Lab 10/09/13 0615  LIPASE 33   No results found for this basename: AMMONIA,  in the last 168 hours CBC:  Recent Labs Lab 10/09/13 0615  WBC 8.7  NEUTROABS 6.3  HGB 12.3  HCT 36.3  MCV 88.5  PLT 276   Cardiac Enzymes: No results found for this basename: CKTOTAL, CKMB, CKMBINDEX, TROPONINI,  in the last 168 hours  BNP (last 3 results) No results found for this basename: PROBNP,  in the last 8760 hours CBG: No results found for this basename: GLUCAP,  in the last 168 hours  Radiological Exams on Admission: Ct Abdomen Pelvis W Contrast  10/09/2013   CLINICAL DATA:  Emesis.  EXAM:  CT ABDOMEN AND PELVIS WITH CONTRAST  TECHNIQUE: Multidetector CT imaging of the abdomen and pelvis was performed using the standard protocol following bolus administration of intravenous contrast.  CONTRAST:  130m OMNIPAQUE IOHEXOL 300 MG/ML  SOLN  COMPARISON:  09/17/2013  FINDINGS: BODY WALL: Unremarkable.  LOWER CHEST: Unremarkable.  ABDOMEN/PELVIS:  Liver: No focal abnormality.  Biliary: No evidence of biliary obstruction or stone.  Pancreas: Unremarkable.  Spleen: Unremarkable.  Adrenals: Unremarkable.  Kidneys and ureters: No hydronephrosis or stone.  Bladder: Unremarkable.  Reproductive: Unremarkable.  Bowel: Circumferential thickening of the colon centered at the splenic flexure secondary to submucosal edema. Inflammatory changes are less extensive than previous. No diverticula in this region. No evidence of bowel necrosis or perforation. The major mesenteric arteries and veins are patent. No significant atherosclerotic change for age. No proximal obstruction. Negative appendix.  Retroperitoneum: No mass or adenopathy.  Peritoneum: Small volume pelvic ascites which is likely reactive.  Vascular: No acute abnormality.  OSSEOUS: Lower lumbar degenerative facet disease with mild anterolisthesis at L4-5. There is focally advanced degenerative disc narrowing at L5-S1.  IMPRESSION: Nonspecific colitis centered at the splenic flexure.   Electronically Signed   By: JJorje GuildM.D.   On: 10/09/2013 21:30   Dg Abd 2 Views  10/09/2013   CLINICAL DATA:  Mid epigastric abdominal pain and nausea  EXAM: ABDOMEN - 2 VIEW  COMPARISON:  Prior CT abdomen/ pelvis 09/17/2013  FINDINGS: The lung bases are clear. The bowel gas pattern is not obstructed. No free air. Unremarkable colonic stool burden. No organomegaly, evidence of ascites or abnormal calcifications. No acute osseous abnormality.  IMPRESSION: Negative.   Electronically Signed   By: HJacqulynn CadetM.D.   On: 10/09/2013 09:10      Assessment/Plan:   58 y.o. female with  Principal Problem:   1.   Colitis  Send Stool for C+S and C.diff PCR  IV Cipro, IV Flagyl  Clear liquid Diet    Active Problems:   2.   Nausea vomiting and diarrhea    Anti-emetics PRN,  Hold Laxative Rx  3.   Hyperlipidemia  On Omega 3 Fatty Acids     4.  Osteoarthritis  Pain Control PRN     5. DVT Prophylaxis   Lovenox     Code Status:   FULL CODE Family Communication:    No Family Present Disposition Plan:       Inpatient  Time spent:  [place] [facility] Pager [phone]   If [place] Please Contact the Day Rounding Team MD for [facility]  If 7PM-7AM, Please Contact night-coverage  [email or web address] Password [email or web address] 10/10/2013, 1:10 AM

## 2013-10-10 NOTE — Telephone Encounter (Signed)
No charge. 

## 2013-10-10 NOTE — Consult Note (Signed)
Patient seen, examined, and I agree with the above documentation, including the assessment and plan. Intermittent abd pain, colitis and abnl GI imaging.  Ischemic colitis is too of differential in my opinion, but IBD also possible. Agree with colonoscopy.  She is taking little PO now and I do not think she can complete entire prep for procedure tomorrow. Will plan starting GoLytely tonight at her on pace with plans for more ernest prep tomorrow night  The nature of the colonoscopy, as well as the risks, benefits, and alternatives were carefully and thoroughly reviewed with the patient. Ample time for discussion and questions allowed. The patient understood, was satisfied, and agreed to proceed.

## 2013-10-11 DIAGNOSIS — R1084 Generalized abdominal pain: Secondary | ICD-10-CM

## 2013-10-11 MED ORDER — PEG 3350-KCL-NA BICARB-NACL 420 G PO SOLR
4000.0000 mL | Freq: Once | ORAL | Status: AC
  Administered 2013-10-11: 4000 mL via ORAL
  Filled 2013-10-11: qty 4000

## 2013-10-11 MED ORDER — HYOSCYAMINE SULFATE 0.125 MG SL SUBL
0.2500 mg | SUBLINGUAL_TABLET | Freq: Four times a day (QID) | SUBLINGUAL | Status: DC | PRN
  Administered 2013-10-11: 0.25 mg via SUBLINGUAL
  Filled 2013-10-11: qty 2

## 2013-10-11 NOTE — Progress Notes (Signed)
Patient seen, examined, and I agree with the above documentation, including the assessment and plan. Slowly improving and tolerating colonoscopy prep, now attempting to increase speed of prep intake Will add levsin as needed for lower abd cramping pain Colonoscopy scheduled at present for tomorrow at noon with MAC

## 2013-10-11 NOTE — Progress Notes (Signed)
Daily Rounding Note  10/11/2013, 10:34 AM  LOS: 2 days   SUBJECTIVE:       Pain improved to mild-moderate level.  No nausea but on scheduled Reglan.  Has consumed almost 2 or the 4  Liters of golytely prep, just one BM overnight, loose brown stool this AM. Starting to have abdominal cramps.  used just 5 mg of Oxycodone so far.  Note mild temp this AM  OBJECTIVE:         Vital signs in last 24 hours:    Temp:  [97.8 F (36.6 C)-100.7 F (38.2 C)] 98.7 F (37.1 C) (08/04 1000) Pulse Rate:  [74-89] 74 (08/04 1000) Resp:  [16-18] 16 (08/04 1000) BP: (127-146)/(49-69) 127/53 mmHg (08/04 1000) SpO2:  [94 %-100 %] 96 % (08/04 1000) Last BM Date: 10/10/13 General: pale, somewhat ill looking.   Heart: RRR Chest: clear bil Abdomen: soft, diffusely mild to moderate tenderness.  No guard or rebound.    Extremities: no CCE Neuro/Psych:  Oriented x 3.  No obvious deficits.   Intake/Output from previous day: 08/03 0701 - 08/04 0700 In: 2236.7 [P.O.:1160; I.V.:1076.7] Out: 3300 [Urine:3300]  Intake/Output this shift:    Lab Results:  Recent Labs  10/09/13 0615 10/10/13 0435  WBC 8.7 8.3  HGB 12.3 10.6*  HCT 36.3 32.4*  PLT 276 205   BMET  Recent Labs  10/09/13 0615 10/10/13 0435  NA 142 139  K 4.1 4.3  CL 103 105  CO2 25 23  GLUCOSE 131* 110*  BUN 19 13  CREATININE 0.80 0.57  CALCIUM 10.0 8.3*   LFT  Recent Labs  10/09/13 0615  PROT 6.9  ALBUMIN 3.6  AST 13  ALT 12  ALKPHOS 51  BILITOT 0.3   PT/INR No results found for this basename: LABPROT, INR,  in the last 72 hours Hepatitis Panel No results found for this basename: HEPBSAG, HCVAB, HEPAIGM, HEPBIGM,  in the last 72 hours  Studies/Results: Ct Abdomen Pelvis W Contrast  10/09/2013   CLINICAL DATA:  Emesis.  EXAM: CT ABDOMEN AND PELVIS WITH CONTRAST  TECHNIQUE: Multidetector CT imaging of the abdomen and pelvis was performed using the standard  protocol following bolus administration of intravenous contrast.  CONTRAST:  13mL OMNIPAQUE IOHEXOL 300 MG/ML  SOLN  COMPARISON:  09/17/2013  FINDINGS: BODY WALL: Unremarkable.  LOWER CHEST: Unremarkable.  ABDOMEN/PELVIS:  Liver: No focal abnormality.  Biliary: No evidence of biliary obstruction or stone.  Pancreas: Unremarkable.  Spleen: Unremarkable.  Adrenals: Unremarkable.  Kidneys and ureters: No hydronephrosis or stone.  Bladder: Unremarkable.  Reproductive: Unremarkable.  Bowel: Circumferential thickening of the colon centered at the splenic flexure secondary to submucosal edema. Inflammatory changes are less extensive than previous. No diverticula in this region. No evidence of bowel necrosis or perforation. The major mesenteric arteries and veins are patent. No significant atherosclerotic change for age. No proximal obstruction. Negative appendix.  Retroperitoneum: No mass or adenopathy.  Peritoneum: Small volume pelvic ascites which is likely reactive.  Vascular: No acute abnormality.  OSSEOUS: Lower lumbar degenerative facet disease with mild anterolisthesis at L4-5. There is focally advanced degenerative disc narrowing at L5-S1.  IMPRESSION: Nonspecific colitis centered at the splenic flexure.   Electronically Signed   By: Jorje Guild M.D.   On: 10/09/2013 21:30   Scheduled Meds: . citalopram  20 mg Oral Daily  . enoxaparin (LOVENOX) injection  40 mg Subcutaneous Q24H  . fluticasone  2 spray Each  Nare Daily  . metoCLOPramide (REGLAN) injection  5 mg Intravenous TID  . omega-3 acid ethyl esters  1 g Oral Daily   Continuous Infusions: . sodium chloride 50 mL/hr at 10/11/13 0938   PRN Meds:.acetaminophen, acetaminophen, alum & mag hydroxide-simeth, HYDROmorphone (DILAUDID) injection, ondansetron (ZOFRAN) IV, ondansetron, oxyCODONE  ASSESMENT:   *  Colitis.  Bouts of abdominal pain, nausea, diarrhea.  Despite low level fever, not inclined to restart abx.  *  Chronic Mobic for  polyarticular pain. On hold.     PLAN   *  Plan inpt colonoscopy for tomorrow AM.  Likely will need additional golyetly prep tonight. Finish current jug in next few hours. Continue Reglan tid. *  Will stop Lovenox to allow for colonoscopic intervention. Ordered PAS hose in its place.     Azucena Freed  10/11/2013, 10:34 AM Pager: [phone]

## 2013-10-11 NOTE — Progress Notes (Signed)
[facility] Progress Note   [name] QMG:[id number] DOB: 02/25/1956 DOA: 10/09/2013 PCP: Odette Fraction, MD  Brief narrative: Monique Gonzalez is a 58 y.o. female  presents with vomiting and diarrhea after being started on antibiotics this weekend for abdominal pain and vomiting. CT on admission on 8/2 reveals sigmoid edema. Has not had any diarrhea since the day before admission when she had 3 large liquid stool.    Subjective: Cont to be stable on clears- no abd pain, vomiting or diarrhea.   Assessment/Plan: Principal Problem:   Colitis? - have consulted GI as Dr Ardis Hughs saw the pt on 7/27 and wanted to perform a colonoscopy  due to colon wall thickening seen on CT- undergoing colon prep -  GI discontinued antibiotics - having low grade fevers- UA negative and no resp symptoms- no other source of fevers noted as of yet   Active Problems: Depression -cont citalopram  Code Status: full code Family Communication: none Disposition Plan: home  Consultants: GI  Procedures: none  Antibiotics: Anti-infectives   Start     Dose/Rate Route Frequency Ordered Stop   10/09/13 2245  ciprofloxacin (CIPRO) IVPB 400 mg     400 mg 200 mL/hr over 60 Minutes Intravenous  Once 10/09/13 2232 10/10/13 0355   10/09/13 2245  metroNIDAZOLE (FLAGYL) IVPB 500 mg     500 mg 100 mL/hr over 60 Minutes Intravenous  Once 10/09/13 2232 10/10/13 0212       DVT prophylaxis: Lovenox  Objective: Filed Weights   10/09/13 1711 10/10/13 0023  Weight: 104.327 kg (230 lb) 102.7 kg (226 lb 6.6 oz)    Intake/Output Summary (Last 24 hours) at 10/11/13 1001 Last data filed at 10/11/13 0523  Gross per 24 hour  Intake 1796.67 ml  Output   2900 ml  Net -1103.33 ml     Vitals Filed Vitals:   10/10/13 1800 10/10/13 2123 10/11/13 0155 10/11/13 0520  BP: 146/56 145/54 135/61 142/49  Pulse: 89 89 77 83  Temp: 99.5 F (37.5 C) 100.6 F (38.1 C) 100 F (37.8 C) 100.7 F (38.2 C)  TempSrc:  Oral Oral Oral Oral  Resp: 16 17 18 18   Height:      Weight:      SpO2: 100% 94% 99% 98%    Exam: General: No acute respiratory distress Lungs: Clear to auscultation bilaterally without wheezes or crackles Cardiovascular: Regular rate and rhythm without murmur gallop or rub normal S1 and S2 Abdomen:non-tender, nondistended, soft, bowel sounds positive, no rebound, no ascites, no appreciable mass Extremities: No significant cyanosis, clubbing, or edema bilateral lower extremities  Data Reviewed: Basic Metabolic Panel:  Recent Labs Lab 10/09/13 0615 10/10/13 0435  NA 142 139  K 4.1 4.3  CL 103 105  CO2 25 23  GLUCOSE 131* 110*  BUN 19 13  CREATININE 0.80 0.57  CALCIUM 10.0 8.3*   Liver Function Tests:  Recent Labs Lab 10/09/13 0615  AST 13  ALT 12  ALKPHOS 51  BILITOT 0.3  PROT 6.9  ALBUMIN 3.6    Recent Labs Lab 10/09/13 0615  LIPASE 33   No results found for this basename: AMMONIA,  in the last 168 hours CBC:  Recent Labs Lab 10/09/13 0615 10/10/13 0435  WBC 8.7 8.3  NEUTROABS 6.3  --   HGB 12.3 10.6*  HCT 36.3 32.4*  MCV 88.5 88.3  PLT 276 205   Cardiac Enzymes: No results found for this basename: CKTOTAL, CKMB, CKMBINDEX, TROPONINI,  in the last 168 hours  BNP (last 3 results) No results found for this basename: PROBNP,  in the last 8760 hours CBG: No results found for this basename: GLUCAP,  in the last 168 hours  No results found for this or any previous visit (from the past 240 hour(s)).   Studies:  Recent x-ray studies have been reviewed in detail by the Attending Physician  Scheduled Meds:  Scheduled Meds: . citalopram  20 mg Oral Daily  . enoxaparin (LOVENOX) injection  40 mg Subcutaneous Q24H  . fluticasone  2 spray Each Nare Daily  . metoCLOPramide (REGLAN) injection  5 mg Intravenous TID  . omega-3 acid ethyl esters  1 g Oral Daily   Continuous Infusions: . sodium chloride 50 mL/hr at 10/11/13 1700    Time spent on care  of this patient: 35 min   [place], MD 10/11/2013, 10:01 AM  LOS: 2 days   [facility] Office  [phone] Pager - Text Page per [email or web address]  If 7PM-7AM, please contact night-coverage [email or web address]

## 2013-10-12 ENCOUNTER — Encounter (HOSPITAL_COMMUNITY): Payer: Self-pay

## 2013-10-12 ENCOUNTER — Encounter (HOSPITAL_COMMUNITY): Payer: 59 | Admitting: Anesthesiology

## 2013-10-12 ENCOUNTER — Encounter (HOSPITAL_COMMUNITY)
Admission: EM | Disposition: A | Discharge status: Home / Self Care | Payer: Self-pay | Source: Home / Self Care | Attending: Internal Medicine

## 2013-10-12 ENCOUNTER — Inpatient Hospital Stay (HOSPITAL_COMMUNITY): Payer: 59 | Admitting: Anesthesiology

## 2013-10-12 DIAGNOSIS — K56609 Unspecified intestinal obstruction, unspecified as to partial versus complete obstruction: Principal | ICD-10-CM

## 2013-10-12 DIAGNOSIS — K56699 Other intestinal obstruction unspecified as to partial versus complete obstruction: Secondary | ICD-10-CM

## 2013-10-12 DIAGNOSIS — R197 Diarrhea, unspecified: Secondary | ICD-10-CM

## 2013-10-12 HISTORY — PX: COLONOSCOPY: SHX5424

## 2013-10-12 SURGERY — COLONOSCOPY
Anesthesia: Monitor Anesthesia Care

## 2013-10-12 MED ORDER — DEXTROSE 5 % IV SOLN: 1.0000 g | Freq: Two times a day (BID) | INTRAVENOUS | Status: DC

## 2013-10-12 MED ORDER — PROPOFOL 10 MG/ML IV BOLUS
INTRAVENOUS | Status: DC | PRN
  Administered 2013-10-12 (×2): 20 mg via INTRAVENOUS
  Administered 2013-10-12: 10 mg via INTRAVENOUS

## 2013-10-12 MED ORDER — SODIUM CHLORIDE 0.9 % IV SOLN: INTRAVENOUS | Status: DC

## 2013-10-12 MED ORDER — LACTATED RINGERS IV SOLN
INTRAVENOUS | Status: DC | PRN
  Administered 2013-10-12: 13:00:00 via INTRAVENOUS

## 2013-10-12 MED ORDER — POLYETHYLENE GLYCOL 3350 17 G PO PACK
17.0000 g | PACK | Freq: Every day | ORAL | Status: DC
  Administered 2013-10-13: 17 g via ORAL
  Filled 2013-10-12 (×4): qty 1

## 2013-10-12 MED ORDER — DEXTROSE 5 % IV SOLN
1.0000 g | INTRAVENOUS | Status: DC
  Filled 2013-10-12: qty 1

## 2013-10-12 MED ORDER — ENOXAPARIN SODIUM 40 MG/0.4ML ~~LOC~~ SOLN
40.0000 mg | SUBCUTANEOUS | Status: DC
Start: 2013-10-13 — End: 2013-10-13
  Administered 2013-10-13: 40 mg via SUBCUTANEOUS
  Filled 2013-10-12 (×2): qty 0.4

## 2013-10-12 MED ORDER — PROPOFOL INFUSION 10 MG/ML OPTIME
INTRAVENOUS | Status: DC | PRN
  Administered 2013-10-12: 50 ug/kg/min via INTRAVENOUS

## 2013-10-12 MED ORDER — DEXTROSE 5 % IV SOLN
2.0000 g | INTRAVENOUS | Status: DC
Start: 2013-10-13 — End: 2013-10-12
  Filled 2013-10-12: qty 2

## 2013-10-12 MED ORDER — LIDOCAINE HCL (CARDIAC) 20 MG/ML IV SOLN
INTRAVENOUS | Status: DC | PRN
  Administered 2013-10-12: 100 mg via INTRAVENOUS

## 2013-10-12 MED ORDER — ENSURE COMPLETE PO LIQD
237.0000 mL | Freq: Two times a day (BID) | ORAL | Status: DC
  Administered 2013-10-12 – 2013-10-13 (×2): 237 mL via ORAL

## 2013-10-12 MED ORDER — LACTATED RINGERS IV SOLN
INTRAVENOUS | Status: DC
Start: 2013-10-12 — End: 2013-10-14

## 2013-10-12 NOTE — Progress Notes (Signed)
          Daily Rounding Note  10/12/2013, 8:40 AM  LOS: 3 days   SUBJECTIVE:       Finished her prep last night.  Stool looks like urine with small bit of sediment.  Levsin helps the pain.  She feels sore in abdomen but not the previous intense pain.  OBJECTIVE:         Vital signs in last 24 hours:    Temp:  [98.5 F (36.9 C)-99.1 F (37.3 C)] 98.8 F (37.1 C) (08/05 0617) Pulse Rate:  [62-84] 62 (08/05 0617) Resp:  [16-18] 18 (08/05 0617) BP: (121-147)/(53-77) 121/77 mmHg (08/05 0617) SpO2:  [96 %-100 %] 99 % (08/05 0617) Last BM Date: 10/10/13 General: somewhat tired, a bit pale.  comfortable   Heart: RRR Chest: clear bil Abdomen: soft, NT, BS hypoactive, ND  Extremities: no CCE Neuro/Psych:  Pleasant, relaxed.  No confusion.   Intake/Output from previous day: 08/04 0701 - 08/05 0700 In: 3227 [P.O.:1083; I.V.:2144] Out: -   Intake/Output this shift:    Lab Results:  Recent Labs  10/10/13 0435  WBC 8.3  HGB 10.6*  HCT 32.4*  PLT 205  MCV    88  BMET  Recent Labs  10/10/13 0435  NA 139  K 4.3  CL 105  CO2 23  GLUCOSE 110*  BUN 13  CREATININE 0.57  CALCIUM 8.3*     ASSESMENT:   * Colitis. Bouts of abdominal pain, nausea, diarrhea.  Despite low level fever, not inclined to restart abx.  * Chronic Mobic for polyarticular pain. On hold.     PLAN   *  Colonoscopy at 1330.  *  Stop Reglan, as prep is completed.  *  Home later today after colonoscopy?     Azucena Freed  10/12/2013, 8:40 AM Pager: [phone]

## 2013-10-12 NOTE — Consult Note (Signed)
[facility] Surgery Consult Note  Monique Gonzalez 04-Aug-1955  [id number].    Requesting MD: Dr. Hilarie Fredrickson Chief Complaint/Reason for Consult: stricture of splenic flexure or proximal descending colon  HPI:  58 y.o. white female with a history of HLD, and Osteoarthritis who presented to the [facility] the morning of 10/10/13 due to 2 days of N/V/D and abdominal pain.  A CT scan of the ABD revealed inflammation and thickening of the colon at the splenic flexure.  Was diagnosed with colitis, but CT showed improvement from previous CT thus she was discharged home with antibiotics.  She returned to the ED due to worsening of her symptoms.  She was not able to hold down foods or liquids or able to take her colon prep for her colonoscopy which was scheduled for the AM with Dr. Ardis Hughs.  She was placed on IV Cipro and Flagyl and referred for admission to [facility].  She's complained of severe abdominal pain mostly in RLQ for 3 year associated with nausea and vomiting.  C/o constipation and use of a lot of laxatives in order to have a BM.  Has a BM every 1-2 days.  Denies melena and hematochzia.  Reports one episode of non-bloody, non-bilious, no coffee ground emesis yesterday.  Denies fever and chills.  Denies lightheadedness, dizziness, chest pain, SOB or peripheral edema.  Patient normally eats a fairly healthy diet as she has been working on weight loss over the past year (80lbs).  Works as a Marine scientist in [employer] and is able to avoid strenuous activity post operatively.  She does not have any risk factors for ischemia like AFIB.  Patient was able to tolerate colon prep yesterday and underwent colonoscopy today by Dr. Hilarie Fredrickson which revealed stenosis around the splenic flexure and patchy colitis distal to the stricture in the proximal descending colon. Multiple biopsies were also taken during this procedure, biopsies pending.  C diff pending.    ROS: All systems reviewed and otherwise negative except for as above.    Family History  Problem Relation Age of Onset  . Sudden death Neg Hx   . Hypertension Neg Hx   . Hyperlipidemia Neg Hx   . Heart attack Neg Hx   . Diabetes Neg Hx   . Stroke Mother   . Arthritis Mother   . Cancer Father   . Arthritis Sister   . Cancer Maternal Grandmother   . Stroke Maternal Grandfather   . Cancer Maternal Grandfather   . Cancer Paternal Grandmother   . Heart disease Paternal Grandmother   . Stroke Paternal Grandfather   . Arthritis Sister     Past Medical History  Diagnosis Date  . Seasonal allergies   . Osteoarthritis   . Depression   . Hyperlipidemia   . Recurrent cold sores   . Syncope and collapse 11/30/2012    Normal EEG.  Vaso vagal syncope  . Colitis 09/2012    infectious vs inflammatory.     Past Surgical History  Procedure Laterality Date  . Tonsillectomy      Social History:  reports that she has quit smoking. She has never used smokeless tobacco. She reports that she drinks alcohol. She reports that she does not use illicit drugs.  Allergies:  Allergies  Allergen Reactions  . Penicillins Rash  . Sulfa Antibiotics Rash    Medications Prior to Admission  Medication Sig Dispense Refill  . calcium-vitamin D (OSCAL WITH D) 500-200 MG-UNIT per tablet Take 1 tablet by mouth daily.       Marland Kitchen  Cholecalciferol (VITAMIN D) 1000 UNITS capsule Take 2,000 Units by mouth daily.       . citalopram (CELEXA) 20 MG tablet Take 1 tablet (20 mg total) by mouth daily.  90 tablet  1  . fish oil-omega-3 fatty acids 1000 MG capsule Take 2 g by mouth daily.      . fluticasone (FLONASE) 50 MCG/ACT nasal spray Place 2 sprays into both nostrils daily.  16 g  6  . Glucos-MSM-C-Mn-Ginger-Willow (GLUCOSAMINE MSM COMPLEX) TABS Take 1 tablet by mouth daily.       Marland Kitchen HYDROcodone-acetaminophen (NORCO/VICODIN) 5-325 MG per tablet Take 1-2 tablets by mouth every 6 (six) hours as needed for moderate pain or severe pain.  15 tablet  0  . Linaclotide (LINZESS) 290 MCG CAPS  capsule Take 290 mcg by mouth daily.      Marland Kitchen loratadine (CLARITIN) 10 MG tablet Take 10 mg by mouth daily.        Marland Kitchen Lysine 500 MG TABS Take 500 mg by mouth daily.       . meloxicam (MOBIC) 7.5 MG tablet Take 1 tablet (7.5 mg total) by mouth daily.  90 tablet  1  . Multiple Vitamins-Minerals (MULTIVITAMIN,TX-MINERALS) tablet Take 1 tablet by mouth daily.        . ondansetron (ZOFRAN) 8 MG tablet Take 1 tablet (8 mg total) by mouth 3 (three) times daily.  60 tablet  0  . polyethylene glycol (MIRALAX / GLYCOLAX) packet Take 17 g by mouth daily.  14 each  0  . vitamin E 400 UNIT capsule Take 400 Units by mouth daily.        Marland Kitchen acyclovir cream (ZOVIRAX) 5 % Apply 1 application topically every 3 (three) hours.  15 g  3  . MOVIPREP 100 G SOLR Take 1 kit (200 g total) by mouth once.  1 kit  0  . valACYclovir (VALTREX) 1000 MG tablet Take 2,000 mg by mouth 2 (two) times daily as needed (for 2 doses for flare ups).        Blood pressure 104/57, pulse 73, temperature 98.8 F (37.1 C), temperature source Oral, resp. rate 16, height '5\' 10"'  (1.778 m), weight 226 lb 6.6 oz (102.7 kg), SpO2 100.00%. Physical Exam: General: pleasant, WD/WN white female who is laying in bed in NAD HEENT: head is normocephalic, atraumatic.  Sclera are noninjected.  PERRL.  Ears and nose without any masses or lesions.  Mouth is pink and moist Heart: regular, rate, and rhythm.  No obvious murmurs, gallops, or rubs noted.  Palpable pedal pulses bilaterally Lungs: CTAB, no wheezes, rhonchi, or rales noted.  Respiratory effort nonlabored Abd: soft, ND, TTP in LUQ, +BS, no masses, hernias, or organomegaly, no surgical scars visable MS: all 4 extremities are symmetrical with no cyanosis, clubbing, or edema. Skin: warm and dry with no masses, lesions, or rashes Psych: A&Ox3 with an appropriate affect.   No results found for this or any previous visit (from the past 48 hour(s)). No results found.    Assessment/Plan Stricture of  colon (splenic flexure or proximal descending colon) Colitis Diarrhea - pending C. Diff, secondary to prep Chronic constipation - likely due to stricture  Plan: 1.  Await biopsy results, will need Ex Lap with partial colectomy, timing of procedure will be decided by Dr. Redmond Pulling, will pre-op patient today so patient is ready for surgery if Dr. Redmond Pulling wishes to proceed tomorrow  2.  Clears, NPO after MN, bowel rest, IVF, pain control, antiemetics, pre-op dose  of cefoxitin 3.  Will hold blood thinners for now unless Dr. Redmond Pulling decides he would like them pre-op 4.  Check CBC and CMP in the morning     Hal Morales, [place] [facility] 10/12/2013, 2:24 PM Pager: [phone]

## 2013-10-12 NOTE — Progress Notes (Signed)
Patient seen, examined, and I agree with the above documentation, including the assessment and plan. Colonoscopy now; The nature of the procedure, as well as the risks, benefits, and alternatives were carefully and thoroughly reviewed with the patient. Ample time for discussion and questions allowed. The patient understood, was satisfied, and agreed to proceed.

## 2013-10-12 NOTE — Progress Notes (Signed)
slpeenic flexure stricture found, requiring slim scope to be used to attempt to transverse, but without success. KJHenderson,RN

## 2013-10-12 NOTE — Anesthesia Preprocedure Evaluation (Signed)
Anesthesia Evaluation  Patient identified by MRN, date of birth, ID band Patient awake    Reviewed: Allergy & Precautions, H&P , NPO status , Patient's Chart, lab work & pertinent test results  Airway Mallampati: II      Dental   Pulmonary former smoker,          Cardiovascular negative cardio ROS      Neuro/Psych    GI/Hepatic Neg liver ROS, History noted. CE   Endo/Other  negative endocrine ROS  Renal/GU negative Renal ROS     Musculoskeletal   Abdominal   Peds  Hematology   Anesthesia Other Findings   Reproductive/Obstetrics                           Anesthesia Physical Anesthesia Plan  ASA: II  Anesthesia Plan: MAC   Post-op Pain Management:    Induction: Intravenous  Airway Management Planned: Simple Face Mask  Additional Equipment:   Intra-op Plan:   Post-operative Plan:   Informed Consent: I have reviewed the patients History and Physical, chart, labs and discussed the procedure including the risks, benefits and alternatives for the proposed anesthesia with the patient or authorized representative who has indicated his/her understanding and acceptance.   Dental advisory given  Plan Discussed with: CRNA, Anesthesiologist and Surgeon  Anesthesia Plan Comments:         Anesthesia Quick Evaluation

## 2013-10-12 NOTE — Anesthesia Procedure Notes (Addendum)
Procedure Name: MAC Date/Time: 10/12/2013 1:35 PM Performed by: Antonietta Breach Pre-anesthesia Checklist: Patient identified, Patient being monitored, Timeout performed, Emergency Drugs available and Suction available Patient Re-evaluated:Patient Re-evaluated prior to inductionOxygen Delivery Method: Nasal cannula

## 2013-10-12 NOTE — Progress Notes (Signed)
[facility] PROGRESS NOTE  Monique Gonzalez LNL:[id number] DOB: Feb 22, 1956 DOA: 10/09/2013 PCP: Odette Fraction, MD  Assessment/Plan: Principal Problem:   Colitis Active Problems:   Osteoarthritis   Hyperlipidemia   Nausea vomiting and diarrhea    Brief narrative:  Monique Gonzalez is a 58 y.o. female presents with vomiting and diarrhea after being started on antibiotics this weekend for abdominal pain and vomiting. CT on admission on 8/2 reveals sigmoid edema.     Assessment/Plan:    Colitis? Ischemic colitis inflammatory bowel diseasevs  - have consulted GI as Dr Ardis Hughs saw the pt on 7/27 and wanted to perform a colonoscopy today due to colon wall thickening seen on CT   patient received just one dose of Flagyl and Cipro at the time of admission Antibiotics were not continued after that We'll hold off on antibiotics for now and before the results of the colonoscopy Repeat stool studies    Depression  -cont citalopram    Code Status: full code  Family Communication: none  Disposition Plan: home  possibly tomorrow  Consultants:  GI  Procedures:  none  Antibiotics:  Anti-infectives    Start    Dose/Rate  Route  Frequency  Ordered  Stop    10/09/13 2245   ciprofloxacin (CIPRO) IVPB 400 mg  400 mg  200 mL/hr over 60 Minutes  Intravenous  Once  10/09/13 2232  10/10/13 0355    10/09/13 2245   metroNIDAZOLE (FLAGYL) IVPB 500 mg  500 mg  100 mL/hr over 60 Minutes  Intravenous  Once  10/09/13 2232  10/10/13 0212      DVT prophylaxis:  Lovenox   HPI/Subjective: Finished her prep last night. Stool looks like urine with small bit of sediment. Levsin helps the pain Diffuse abdominal pain  Objective: Filed Vitals:   10/11/13 1352 10/11/13 1810 10/11/13 2210 10/12/13 0617  BP: 138/55 128/65 147/58 121/77  Pulse: 80 84 65 62  Temp: 98.5 F (36.9 C)  99.1 F (37.3 C) 98.8 F (37.1 C)  TempSrc: Oral Oral Oral   Resp: 18 16 17 18   Height:      Weight:      SpO2: 99%  100% 100% 99%    Intake/Output Summary (Last 24 hours) at 10/12/13 1141 Last data filed at 10/12/13 0542  Gross per 24 hour  Intake   2987 ml  Output      0 ml  Net   2987 ml    Exam:  General: alert & oriented x 3 In NAD  Cardiovascular: RRR, nl S1 s2  Respiratory: Decreased breath sounds at the bases, scattered rhonchi, no crackles  Abdomen: soft +BS NT/ND, no masses palpable  Extremities: No cyanosis and no edema      Data Reviewed: Basic Metabolic Panel:  Recent Labs Lab 10/09/13 0615 10/10/13 0435  NA 142 139  K 4.1 4.3  CL 103 105  CO2 25 23  GLUCOSE 131* 110*  BUN 19 13  CREATININE 0.80 0.57  CALCIUM 10.0 8.3*    Liver Function Tests:  Recent Labs Lab 10/09/13 0615  AST 13  ALT 12  ALKPHOS 51  BILITOT 0.3  PROT 6.9  ALBUMIN 3.6    Recent Labs Lab 10/09/13 0615  LIPASE 33   No results found for this basename: AMMONIA,  in the last 168 hours  CBC:  Recent Labs Lab 10/09/13 0615 10/10/13 0435  WBC 8.7 8.3  NEUTROABS 6.3  --   HGB 12.3 10.6*  HCT 36.3 32.4*  MCV 88.5 88.3  PLT 276 205    Cardiac Enzymes: No results found for this basename: CKTOTAL, CKMB, CKMBINDEX, TROPONINI,  in the last 168 hours BNP (last 3 results) No results found for this basename: PROBNP,  in the last 8760 hours   CBG: No results found for this basename: GLUCAP,  in the last 168 hours  No results found for this or any previous visit (from the past 240 hour(s)).   Studies: Ct Abdomen Pelvis W Contrast  10/09/2013   CLINICAL DATA:  Emesis.  EXAM: CT ABDOMEN AND PELVIS WITH CONTRAST  TECHNIQUE: Multidetector CT imaging of the abdomen and pelvis was performed using the standard protocol following bolus administration of intravenous contrast.  CONTRAST:  118mL OMNIPAQUE IOHEXOL 300 MG/ML  SOLN  COMPARISON:  09/17/2013  FINDINGS: BODY WALL: Unremarkable.  LOWER CHEST: Unremarkable.  ABDOMEN/PELVIS:  Liver: No focal abnormality.  Biliary: No evidence of biliary  obstruction or stone.  Pancreas: Unremarkable.  Spleen: Unremarkable.  Adrenals: Unremarkable.  Kidneys and ureters: No hydronephrosis or stone.  Bladder: Unremarkable.  Reproductive: Unremarkable.  Bowel: Circumferential thickening of the colon centered at the splenic flexure secondary to submucosal edema. Inflammatory changes are less extensive than previous. No diverticula in this region. No evidence of bowel necrosis or perforation. The major mesenteric arteries and veins are patent. No significant atherosclerotic change for age. No proximal obstruction. Negative appendix.  Retroperitoneum: No mass or adenopathy.  Peritoneum: Small volume pelvic ascites which is likely reactive.  Vascular: No acute abnormality.  OSSEOUS: Lower lumbar degenerative facet disease with mild anterolisthesis at L4-5. There is focally advanced degenerative disc narrowing at L5-S1.  IMPRESSION: Nonspecific colitis centered at the splenic flexure.   Electronically Signed   By: Jorje Guild M.D.   On: 10/09/2013 21:30   Ct Abdomen Pelvis W Contrast  09/17/2013   CLINICAL DATA:  Central abdominal pain and nausea. Left lower quadrant pain.  EXAM: CT ABDOMEN AND PELVIS WITH CONTRAST  TECHNIQUE: Multidetector CT imaging of the abdomen and pelvis was performed using the standard protocol following bolus administration of intravenous contrast.  CONTRAST:  160mL OMNIPAQUE IOHEXOL 300 MG/ML SOLN, 20mL OMNIPAQUE IOHEXOL 300 MG/ML SOLN  COMPARISON:  01/04/2011  FINDINGS: Mild dependent atelectasis in the lung bases. Small amount of free fluid around the spleen and tracking along the left pericolic gutter into the pelvis. The spleen appears intact. There is wall thickening in the descending and transverse colon indicating infectious or inflammatory colitis. Diffusely stool-filled colon without distention. No pneumatosis or portal venous gas.  The liver, spleen, gallbladder, pancreas, adrenal glands, kidneys, abdominal aorta, inferior vena  cava, and retroperitoneal lymph nodes are unremarkable. Stomach and small bowel are not abnormally distended. No free air in the abdomen.  Pelvis: No pelvic mass or lymphadenopathy. No bladder wall thickening. Appendix is not identified. No changes suggesting diverticulitis. Degenerative changes in the spine and hips. No destructive bone lesions appreciated. On  IMPRESSION: Stool-filled colon. Wall thickening in the transverse and descending colon suggesting infectious or inflammatory colitis. Small amount of free fluid along the spleen, left pericolic gutter, and pelvis is likely related to the inflammatory colonic process.   Electronically Signed   By: Lucienne Capers M.D.   On: 09/17/2013 06:33   Dg Abd 2 Views  10/09/2013   CLINICAL DATA:  Mid epigastric abdominal pain and nausea  EXAM: ABDOMEN - 2 VIEW  COMPARISON:  Prior CT abdomen/ pelvis 09/17/2013  FINDINGS: The lung bases are clear. The bowel gas pattern  is not obstructed. No free air. Unremarkable colonic stool burden. No organomegaly, evidence of ascites or abnormal calcifications. No acute osseous abnormality.  IMPRESSION: Negative.   Electronically Signed   By: Jacqulynn Cadet M.D.   On: 10/09/2013 09:10   Ct Angio Abd/pel W/ And/or W/o  09/18/2013   CLINICAL DATA:  Evaluate for ischemic colitis  EXAM: CT ANGIOGRAPHY ABDOMEN AND PELVIS WITH CONTRAST  TECHNIQUE: Multidetector CT imaging of the abdomen and pelvis was performed using the standard protocol during bolus administration of intravenous contrast. Multiplanar reconstructed images including MIPs were obtained and reviewed to evaluate the vascular anatomy.  CONTRAST:  140mL OMNIPAQUE IOHEXOL 350 MG/ML SOLN  COMPARISON:  CT abdomen pelvis-earlier same day; 01/04/2011  FINDINGS: Vascular Findings:  Abdominal aorta: Scattered very minimal mixed calcified and noncalcified atherosclerotic plaque within a normal caliber abdominal aorta. No abdominal aortic dissection or periaortic stranding.   Celiac artery: There is a minimal amount of eccentric calcified plaque involving the cranial aspect of the origin the celiac artery, not resulting in hemodynamically significant stenosis. Conventional branching pattern.  SMA: There is a very minimal amount of eccentric calcified plaque involving the cranial aspect of the origin of the SMA, not resulting in a hemodynamically significant stenosis. The distal tributaries of the SMA appear widely patent and are without discrete intraluminal filling defect to suggest distal embolism.  Right Renal artery: Solitary; widely patent without hemodynamically significant stenosis.  Left Renal artery: Solitary; widely patent without hemodynamically significant stenosis.  IMA: Widely patent without hemodynamically significant stenosis. The distal tributaries of the IMA are widely patent without discrete intraluminal filling defect to suggest distal embolism.  Pelvic vasculature: The bilateral common, external and internal iliac arteries are of normal caliber and widely patent without hemodynamically significant stenosis.  Review of the MIP images confirms the above findings.   --------------------------------------------------------------------------------  Nonvascular Findings:  Grossly unchanged mild though rather diffuse bowel wall thickening primarily involving the splenic flexure of the colon (representative axial images 96 and 170, series 4, coronal images 29 and 53, series 8). These findings are again associated with a small amounts of adjacent mesenteric stranding and fluid which tracks throughout the left pericolic gutter and into the lower pelvis. No definable/drainable fluid collection. No pneumoperitoneum, pneumatosis or portal venous gas.  The bowel is otherwise normal in course and caliber. Normal appearance of the retrocecal appendix. Moderate colonic stool burden without evidence of obstruction.  Normal hepatic contour. There is a very minimal amount of focal fatty  infiltration adjacent to the fissure for ligamentum teres. No discrete hepatic lesions. There is a minimal amount of vicarious excretion of contrast seen within an otherwise normal-appearing gallbladder. No gallbladder wall thickening or pericholecystic fluid. No intra or extrahepatic biliary duct dilatation. No ascites.  There is symmetric enhancement and excretion of the bilateral kidneys. No definite renal stones on this postcontrast examination. No discrete renal lesions. No urinary obstruction or perinephric stranding. There is mild thickening of the medial limb of the left adrenal gland without discrete nodule. Normal appearance of the right adrenal gland, pancreas and spleen. There is a minimal amount of perisplenic fluid.  No retroperitoneal, mesenteric, pelvic or inguinal lymphadenopathy.  Normal appearance of the pelvic organs for age. No discrete adnexal lesion.  Limited visualization of the lower thorax demonstrates minimal subsegmental atelectasis within the imaged bilateral lower lobes as well as the inferior segment of the lingula. No discrete focal airspace opacities. No pleural effusion.  Borderline cardiomegaly. Trace amount of pericardial fluid, presumably physiologic.  No acute or aggressive osseus abnormalities. Mild to moderate multilevel lumbar spine DDD, worse at L5-S1 with disc space height loss, endplate irregularity and sclerosis. Regional soft tissues appear normal.  IMPRESSION: 1. Grossly unchanged mild though rather diffuse wall thickening extending from the transverse colon to the mid descending colon. There are no discrete intraluminal filling defects within the mesenteric vasculature to suggest embolism as a source of mesenteric ischemia, though note, the distribution of colonic wall thickening could be seen in the setting of a watershed ischemic episode in the setting of profound hypotension. Clinical correlation is advised. In the absence of this clinical history, the colonic wall  thickening is favored to be of either infectious and/or inflammatory etiology. 2. Grossly unchanged mesenteric stranding and fluid within the left pericolic gutter extending to the lower pelvis without definable/drainable fluid collection.   Electronically Signed   By: Sandi Mariscal M.D.   On: 09/18/2013 08:12    Scheduled Meds: . citalopram  20 mg Oral Daily  . fluticasone  2 spray Each Nare Daily  . omega-3 acid ethyl esters  1 g Oral Daily   Continuous Infusions: . sodium chloride 50 mL/hr at 10/12/13 0542  . sodium chloride 20 mL/hr at 10/12/13 0840    Principal Problem:   Colitis Active Problems:   Osteoarthritis   Hyperlipidemia   Nausea vomiting and diarrhea    Time spent: 40 minutes   [facility] Pager [phone]. If 8PM-8AM, please contact night-coverage at [email or web address], password [facility] 10/12/2013, 11:41 AM  LOS: 3 days

## 2013-10-12 NOTE — Transfer of Care (Signed)
Immediate Anesthesia Transfer of Care Note  Patient: Monique Gonzalez  Procedure(s) Performed: Procedure(s): COLONOSCOPY (N/A)  Patient Location: PACU  Anesthesia Type:MAC  Level of Consciousness: awake, alert  and oriented  Airway & Oxygen Therapy: Patient Spontanous Breathing and Patient connected to nasal cannula oxygen  Post-op Assessment: Report given to PACU RN and Post -op Vital signs reviewed and stable  Post vital signs: Reviewed and stable  Complications: No apparent anesthesia complications

## 2013-10-12 NOTE — Op Note (Signed)
[facility] [place], [id number]   COLONOSCOPY PROCEDURE REPORT  PATIENT: Monique Gonzalez, Monique Gonzalez  MR#: [id number] BIRTHDATE: 24-Oct-1955 , 58  yrs. old GENDER: Female ENDOSCOPIST: [name], MD REFERRED JE:[facility] PROCEDURE DATE:  10/12/2013 PROCEDURE:   Colonoscopy with biopsy First Screening Colonoscopy - Avg.  risk and is 50 yrs.  old or older - No.  Prior Negative Screening - Now for repeat screening. N/A  History of Adenoma - Now for follow-up colonoscopy & has been > or = to 3 yrs.  N/A  Polyps Removed Today? No.  Recommend repeat exam, <10 yrs? Yes.  No reason given. ASA CLASS:   Class II INDICATIONS:Abdominal pain, Constipation, and an abnormal CT. MEDICATIONS: MAC sedation, administered by CRNA and See Anesthesia Report.  DESCRIPTION OF PROCEDURE:   After the risks benefits and alternatives of the procedure were thoroughly explained, informed consent was obtained.  A digital rectal exam revealed no rectal mass.   The EC-[id number] ([id number])  endoscope was introduced through the anus and advanced to the splenic flexure. No adverse events experienced.   Limited by a stricture.   The quality of the prep was good, using GoLytely.  The instrument was then slowly withdrawn as the colon was fully examined.   COLON FINDINGS: Intrinsic stenosis (narrowing) was found at the splenic flexure with associated inflammation. This stricture is 45 cm from the dentate line, and was unable to be traversed despite the use of the ultraslim Pentax colonoscope.  There is mild pseudodiverticulosis at the stricture. Multiple biopsies were performed using cold forceps.   Abnormal mucosa was found in the proximal descending colon, just distal to the stricture.  The mucosa was erythematous, nodular and had superficial ulcers, though overall this inflammation is mild.  Multiple biopsies of the area were performed using cold forceps.   The colon mucosa distal  to 5 cm distal to the stricture was otherwise normal. Several random biopsies were obtained to rule out IBD. Retroflexed views revealed hypertrophied anal papilla.      The scope was withdrawn and the procedure completed.  COMPLICATIONS: There were no complications.  ENDOSCOPIC IMPRESSION: 1.   Stenosis (narrowing) at the splenic flexure unable to be traversed;  multiple biopsies were performed using cold forceps 2.   Patchy colitis was found for 5 cm distal to the stricture in the proximal descending colon; multiple biopsies of the area were performed using cold forceps 3.   The remaining examined colon mucosa was otherwise normal  RECOMMENDATIONS: 1.  Clear liquid diet for now. 2.  Surgical consultation 3.  Await pathology results   eSigned:  [name], MD 10/12/2013 2:29 PM   cc: The Patient   PATIENT NAME:  Monique Gonzalez, Monique Gonzalez MR#: [id number]

## 2013-10-12 NOTE — Anesthesia Postprocedure Evaluation (Signed)
  Anesthesia Post-op Note  Patient: Monique Gonzalez  Procedure(s) Performed: Procedure(s): COLONOSCOPY (N/A)  Patient Location: PACU  Anesthesia Type:MAC  Level of Consciousness: awake  Airway and Oxygen Therapy: Patient Spontanous Breathing  Post-op Pain: mild  Post-op Assessment: Post-op Vital signs reviewed  Post-op Vital Signs: Reviewed  Last Vitals:  Filed Vitals:   10/12/13 1425  BP: 134/56  Pulse: 60  Temp: 36.6 C  Resp: 12    Complications: No apparent anesthesia complications

## 2013-10-12 NOTE — Consult Note (Signed)
I saw the patient, participated in the history, exam and medical decision making, and concur with the physician assistant's note above.  Pt seen and examined. Ct's and  Colonoscopy reviewed.  Alert, nad Soft, obese, nt, No edema  Cardiac ROS negative. Exercises regularly. 80 lb planned wt loss.   Distal transverse/splenic flexure colitis with splenic flexure stricture bx pending.  Would like to discuss case with GI. And get a prelim path report Therefore no surgery on Thursday Pt can have clears + boost/resource. Do not advance diet Will resume chemical VTE prophylaxis Ambulate  Leighton Ruff. Redmond Pulling, MD, FACS General, Bariatric, & Minimally Invasive Surgery [facility], [place]

## 2013-10-13 ENCOUNTER — Encounter (HOSPITAL_COMMUNITY): Payer: Self-pay | Admitting: Internal Medicine

## 2013-10-13 LAB — COMPREHENSIVE METABOLIC PANEL
ALK PHOS: 44 U/L (ref 39–117)
ALT: 8 U/L (ref 0–35)
AST: 8 U/L (ref 0–37)
Albumin: 2.6 g/dL — ABNORMAL LOW (ref 3.5–5.2)
Anion gap: 9 (ref 5–15)
BUN: 4 mg/dL — ABNORMAL LOW (ref 6–23)
CO2: 28 mEq/L (ref 19–32)
Calcium: 8.6 mg/dL (ref 8.4–10.5)
Chloride: 105 mEq/L (ref 96–112)
Creatinine, Ser: 0.61 mg/dL (ref 0.50–1.10)
GFR calc Af Amer: >90 mL/min (ref >90)
GFR calc non Af Amer: >90 mL/min (ref >90)
Glucose, Bld: 93 mg/dL (ref 70–99)
POTASSIUM: 3.7 meq/L (ref 3.7–5.3)
Sodium: 142 mEq/L (ref 137–147)
Total Bilirubin: 0.3 mg/dL (ref 0.3–1.2)
Total Protein: 5.3 g/dL — ABNORMAL LOW (ref 6.0–8.3)

## 2013-10-13 LAB — CBC
HEMATOCRIT: 28.1 % — AB (ref 36.0–46.0)
Hemoglobin: 9.6 g/dL — ABNORMAL LOW (ref 12.0–15.0)
MCH: 29.5 pg (ref 26.0–34.0)
MCHC: 34.2 g/dL (ref 30.0–36.0)
MCV: 86.5 fL (ref 78.0–100.0)
Platelets: 193 10*3/uL (ref 150–400)
RBC: 3.25 MIL/uL — ABNORMAL LOW (ref 3.87–5.11)
RDW: 12.6 % (ref 11.5–15.5)
WBC: 3.4 10*3/uL — AB (ref 4.0–10.5)

## 2013-10-13 LAB — SURGICAL PCR SCREEN
MRSA, PCR: NEGATIVE
STAPHYLOCOCCUS AUREUS: NEGATIVE

## 2013-10-13 LAB — TYPE AND SCREEN
ABO/RH(D): AB POS
ANTIBODY SCREEN: NEGATIVE

## 2013-10-13 LAB — ABO/RH: ABO/RH(D): AB POS

## 2013-10-13 MED ORDER — BOOST / RESOURCE BREEZE PO LIQD
1.0000 | Freq: Two times a day (BID) | ORAL | Status: DC
  Administered 2013-10-13: 1 via ORAL

## 2013-10-13 MED ORDER — METRONIDAZOLE 500 MG PO TABS
1000.0000 mg | ORAL_TABLET | ORAL | Status: AC
  Administered 2013-10-13 (×4): 1000 mg via ORAL
  Filled 2013-10-13 (×4): qty 2

## 2013-10-13 MED ORDER — CLINDAMYCIN PHOSPHATE 900 MG/50ML IV SOLN
900.0000 mg | INTRAVENOUS | Status: AC
  Administered 2013-10-14: 900 mg via INTRAVENOUS
  Filled 2013-10-13 (×2): qty 50

## 2013-10-13 MED ORDER — HEPARIN SODIUM (PORCINE) 5000 UNIT/ML IJ SOLN
5000.0000 [IU] | INTRAMUSCULAR | Status: AC
  Administered 2013-10-14: 5000 [IU] via INTRAVENOUS
  Filled 2013-10-13 (×2): qty 5
  Filled 2013-10-13: qty 1

## 2013-10-13 MED ORDER — GENTAMICIN SULFATE 40 MG/ML IJ SOLN
5.0000 mg/kg | INTRAMUSCULAR | Status: AC
  Administered 2013-10-14: 510 mg via INTRAVENOUS
  Filled 2013-10-13: qty 12.75

## 2013-10-13 MED ORDER — NEOMYCIN SULFATE 500 MG PO TABS
1000.0000 mg | ORAL_TABLET | ORAL | Status: AC
  Administered 2013-10-13 (×4): 1000 mg via ORAL
  Filled 2013-10-13 (×5): qty 2

## 2013-10-13 MED ORDER — ENOXAPARIN SODIUM 40 MG/0.4ML ~~LOC~~ SOLN
40.0000 mg | SUBCUTANEOUS | Status: DC
Start: 2013-10-15 — End: 2013-10-20
  Administered 2013-10-15 – 2013-10-19 (×6): 40 mg via SUBCUTANEOUS
  Filled 2013-10-13 (×9): qty 0.4

## 2013-10-13 MED ORDER — ALVIMOPAN 12 MG PO CAPS
12.0000 mg | ORAL_CAPSULE | Freq: Once | ORAL | Status: DC
  Filled 2013-10-13: qty 1

## 2013-10-13 NOTE — Progress Notes (Signed)
[name], female   DOB: 05/12/1955, 58 y.o.   MRN: [id number]     [facility]      [address] [place], [place] [address]    Phone: [phone] FAX: [phone]     Subjective: Had a BM.  No n/v.  Sore, but not much pain.  Ambulating in hallways.    Objective:  Vital signs:  Filed Vitals:   10/12/13 1737 10/12/13 2048 10/13/13 0220 10/13/13 0619  BP: 141/64 113/57 127/53 131/61  Pulse: 84 88 77 66  Temp: 98 F (36.7 C) 98.9 F (37.2 C) 98.4 F (36.9 C) 98.5 F (36.9 C)  TempSrc: Oral Oral Oral Oral  Resp: '14 16 16 16  ' Height:      Weight:      SpO2: 96% 100% 99% 98%    Last BM Date: 10/12/13  Intake/Output   Yesterday:  08/05 0701 - 08/06 0700 In: 740 [P.O.:240; I.V.:500] Out: 1600 [Urine:1600] This shift:    I/O last 3 completed shifts: In: 2887 [P.O.:243; I.V.:2644] Out: 1600 [Urine:1600]    Physical Exam: General: Pt awake/alert/oriented x4 in no acute distress Abdomen: Soft.  Nondistended.  Non tender.   No evidence of peritonitis.  No incarcerated hernias.   Problem List:   Principal Problem:   Colitis Active Problems:   Osteoarthritis   Hyperlipidemia   Nausea vomiting and diarrhea   Colonic stricture    Results:   Labs: Results for orders placed during the hospital encounter of 10/09/13 (from the past 48 hour(s))  COMPREHENSIVE METABOLIC PANEL     Status: Abnormal   Collection Time    10/13/13  5:50 AM      Result Value Ref Range   Sodium 142  137 - 147 mEq/L   Potassium 3.7  3.7 - 5.3 mEq/L   Chloride 105  96 - 112 mEq/L   CO2 28  19 - 32 mEq/L   Glucose, Bld 93  70 - 99 mg/dL   BUN 4 (*) 6 - 23 mg/dL   Creatinine, Ser 0.61  0.50 - 1.10 mg/dL   Calcium 8.6  8.4 - 10.5 mg/dL   Total Protein 5.3 (*) 6.0 - 8.3 g/dL   Albumin 2.6 (*) 3.5 - 5.2 g/dL   AST 8  0 - 37 U/L   ALT 8  0 - 35 U/L   Alkaline Phosphatase 44  39 - 117 U/L   Total Bilirubin 0.3  0.3 - 1.2 mg/dL   GFR calc non Af Amer >90  >90 mL/min   GFR calc Af Amer >90  >90 mL/min   Comment: (NOTE)     The eGFR has been calculated using the CKD EPI equation.     This calculation has not been validated in all clinical situations.     eGFR's persistently <90 mL/min signify possible Chronic Kidney     Disease.   Anion gap 9  5 - 15  CBC     Status: Abnormal   Collection Time    10/13/13  5:50 AM      Result Value Ref Range   WBC 3.4 (*) 4.0 - 10.5 K/uL   RBC 3.25 (*) 3.87 - 5.11 MIL/uL   Hemoglobin 9.6 (*) 12.0 - 15.0 g/dL   HCT 28.1 (*) 36.0 - 46.0 %   MCV 86.5  78.0 - 100.0 fL   MCH 29.5  26.0 - 34.0 pg   MCHC 34.2  30.0 - 36.0  g/dL   RDW 12.6  11.5 - 15.5 %   Platelets 193  150 - 400 K/uL    Imaging / Studies: No results found.  Medications / Allergies:  Scheduled Meds: . citalopram  20 mg Oral Daily  . enoxaparin (LOVENOX) injection  40 mg Subcutaneous Q24H  . feeding supplement (ENSURE COMPLETE)  237 mL Oral BID BM  . fluticasone  2 spray Each Nare Daily  . omega-3 acid ethyl esters  1 g Oral Daily  . polyethylene glycol  17 g Oral Daily   Continuous Infusions: . sodium chloride 50 mL/hr at 10/12/13 0542  . sodium chloride 20 mL/hr at 10/12/13 0840  . lactated ringers     PRN Meds:.acetaminophen, acetaminophen, alum & mag hydroxide-simeth, HYDROmorphone (DILAUDID) injection, hyoscyamine, ondansetron (ZOFRAN) IV, ondansetron, oxyCODONE  Antibiotics: Anti-infectives   Start     Dose/Rate Route Frequency Ordered Stop   10/13/13 0600  cefOXitin (MEFOXIN) 2 g in dextrose 5 % 50 mL IVPB  Status:  Discontinued    Comments:  Pharmacy may adjust dosing strength, interval, or rate of medication as needed for optimal therapy for the patient Send with patient on call to the OR.  Anesthesia to complete antibiotic administration <86mn [facility]   2 g 100 mL/hr over 30 Minutes Intravenous On call to O.R. 10/12/13 1552 10/12/13 1620   10/13/13 0600  cefoTEtan  (CEFOTAN) 1 g in dextrose 5 % 50 mL IVPB  Status:  Discontinued     1 g 100 mL/hr over 30 Minutes Intravenous On call to O.R. 10/12/13 1621 10/12/13 1721   10/12/13 2200  cefoTEtan (CEFOTAN) 1 g in dextrose 5 % 50 mL IVPB  Status:  Discontinued     1 g 100 mL/hr over 30 Minutes Intravenous Every 12 hours 10/12/13 1620 10/12/13 1621   10/09/13 2245  ciprofloxacin (CIPRO) IVPB 400 mg     400 mg 200 mL/hr over 60 Minutes Intravenous  Once 10/09/13 2232 10/10/13 0355   10/09/13 2245  metroNIDAZOLE (FLAGYL) IVPB 500 mg     500 mg 100 mL/hr over 60 Minutes Intravenous  Once 10/09/13 2232 10/10/13 0212      Assessment/Plan Stricture of colon (splenic flexure or proximal descending colon)  Colitis  Diarrhea -resolved Chronic constipation - likely due to stricture  Plan:  -Await biopsy results, will need Ex Lap with partial colectomy, will make her NPO after midnight in anticipation of biopsy results.  i have called pathology lab and they anticipate to have the results tomorrow morning.  -May have clears today -SCD/lovenox -miralax   EErby Pian [facility] Pager [phone] [phone] 10/13/2013 10:29 AM

## 2013-10-13 NOTE — Progress Notes (Signed)
          Daily Rounding Note  10/13/2013, 8:53 AM  LOS: 4 days   SUBJECTIVE:       No abdominal pain.  Stool still mostly clear with some sediment, no blood.  No nausea.  On clears.  OBJECTIVE:         Vital signs in last 24 hours:    Temp:  [97.9 F (36.6 C)-98.9 F (37.2 C)] 98.5 F (36.9 C) (08/06 0619) Pulse Rate:  [60-88] 66 (08/06 0619) Resp:  [12-16] 16 (08/06 0619) BP: (104-155)/(53-65) 131/61 mmHg (08/06 0619) SpO2:  [96 %-100 %] 98 % (08/06 0619) Last BM Date: 10/12/13 General: looks well, in good spirits   Heart: RRR Chest: clear.  No resp issues Abdomen: soft, ND, tender LLQ.  BS active.     Extremities: no CCE Neuro/Psych:  Pleasant, making jokes.  Appropriate.  Intake/Output from previous day: 08/05 0701 - 08/06 0700 In: 740 [P.O.:240; I.V.:500] Out: 1600 [Urine:1600]  Intake/Output this shift:    Lab Results:  Recent Labs  10/13/13 0550  WBC 3.4*  HGB 9.6*  HCT 28.1*  PLT 193   BMET  Recent Labs  10/13/13 0550  NA 142  K 3.7  CL 105  CO2 28  GLUCOSE 93  BUN 4*  CREATININE 0.61  CALCIUM 8.6   LFT  Recent Labs  10/13/13 0550  PROT 5.3*  ALBUMIN 2.6*  AST 8  ALT 8  ALKPHOS 44  BILITOT 0.3     ASSESMENT:   * Distal transverse/splenic flexure colitis with splenic flexure stricture.  Unable to traverse stricture at colonoscopy 8/5.   Surgery on board but waiting on pathology report before proceeding to surgery.  Rule out IBD vs ischemia as source for colitis.   *  Normocytic anemia.  Not in need of transfusion.  No gross passing of blood or melena. Hgb down 3 grams from baseline.    PLAN   *  Await surgical biopsies.  *  Leave on clears with resource supplement.     Azucena Freed  10/13/2013, 8:53 AM Pager: [phone]

## 2013-10-13 NOTE — Progress Notes (Signed)
Pt doing well. No significant co  Discussed case with GI attending - does not feel that even if bx show IBD that biologic therapy would resolve splenic flexure stricture  Therefore rec proceeding with distal transverse/prox L colon resection in am. Pt desires surgery.  I discussed the procedure in detail.  The patient was shown colonic diagram.  We discussed the risks and benefits of surgery including, but not limited to bleeding, infection (such as wound infection, abdominal abscess), injury to surrounding structures, blood clot formation, urinary retention, incisional hernia, anastomotic stricture, anastomotic leak, anesthesia risks, pulmonary & cardiac complications such as pneumonia &/or heart attack, need for additional procedures, ileus, & prolonged hospitalization.  We discussed the typical postoperative recovery course, including limitations & restrictions postoperatively. I explained that the likelihood of improvement in their symptoms is good.  Type/screen entereg IV abx on call to OR preop subcu heparin dose Oral abx prep today Npo after MN Plan lap assisted colectomy in AM  Leighton Ruff. Redmond Pulling, MD, FACS General, Bariatric, & Minimally Invasive Surgery [facility], [place]

## 2013-10-13 NOTE — Progress Notes (Addendum)
[facility] PROGRESS NOTE  Monique Gonzalez CVE:[id number] DOB: 1955/05/20 DOA: 10/09/2013 PCP: Odette Fraction, MD  Assessment/Plan: Principal Problem:   Colitis Active Problems:   Osteoarthritis   Hyperlipidemia   Nausea vomiting and diarrhea   Colonic stricture    Brief narrative:  Monique Gonzalez is a 58 y.o. female presents with vomiting and diarrhea after being started on antibiotics this weekend for abdominal pain and vomiting. CT on admission on 8/2 reveals sigmoid edema.   Colitis Distal transverse/splenic flexure colitis with splenic flexure stricture. Unable to traverse stricture at colonoscopy 8/5.  Surgery on board but waiting on pathology report before proceeding to surgery.  Rule out IBD vs ischemia as source for colitis. patient received just one dose of Flagyl and Cipro at the time of admission  Antibiotics were not continued after that  Awaiting surgical biopsies will need Ex Lap with partial colectomy as per surgery Patient will remain n.p.o. past midnight Hold Lovenox tomorrow morning  Depression  -cont citalopram   Normocytic anemia. Not in need of transfusion. No gross passing of blood or melena. Hgb down 3 grams from baseline  Code Status: full code  Family Communication: none  Disposition Plan: home possibly tomorrow  Consultants:  GI  Procedures:  none  Antibiotics:  Anti-infectives    Start    Dose/Rate  Route  Frequency  Ordered  Stop    10/09/13 2245   ciprofloxacin (CIPRO) IVPB 400 mg  400 mg  200 mL/hr over 60 Minutes  Intravenous  Once  10/09/13 2232  10/10/13 0355    10/09/13 2245   metroNIDAZOLE (FLAGYL) IVPB 500 mg  500 mg  100 mL/hr over 60 Minutes  Intravenous  Once  10/09/13 2232  10/10/13 0212     DVT prophylaxis:  Lovenox   HPI/Subjective: Ambulating the halls, no complaints  Objective: Filed Vitals:   10/12/13 1737 10/12/13 2048 10/13/13 0220 10/13/13 0619  BP: 141/64 113/57 127/53 131/61  Pulse: 84 88 77 66  Temp: 98  F (36.7 C) 98.9 F (37.2 C) 98.4 F (36.9 C) 98.5 F (36.9 C)  TempSrc: Oral Oral Oral Oral  Resp: 14 16 16 16   Height:      Weight:      SpO2: 96% 100% 99% 98%    Intake/Output Summary (Last 24 hours) at 10/13/13 1141 Last data filed at 10/13/13 0258  Gross per 24 hour  Intake    740 ml  Output   1600 ml  Net   -860 ml    Exam:  General: alert & oriented x 3 In NAD  Cardiovascular: RRR, nl S1 s2  Respiratory: Decreased breath sounds at the bases, scattered rhonchi, no crackles  Abdomen: soft +BS NT/ND, no masses palpable  Extremities: No cyanosis and no edema      Data Reviewed: Basic Metabolic Panel:  Recent Labs Lab 10/09/13 0615 10/10/13 0435 10/13/13 0550  NA 142 139 142  K 4.1 4.3 3.7  CL 103 105 105  CO2 25 23 28   GLUCOSE 131* 110* 93  BUN 19 13 4*  CREATININE 0.80 0.57 0.61  CALCIUM 10.0 8.3* 8.6    Liver Function Tests:  Recent Labs Lab 10/09/13 0615 10/13/13 0550  AST 13 8  ALT 12 8  ALKPHOS 51 44  BILITOT 0.3 0.3  PROT 6.9 5.3*  ALBUMIN 3.6 2.6*    Recent Labs Lab 10/09/13 0615  LIPASE 33   No results found for this basename: AMMONIA,  in the last 168 hours  CBC:  Recent Labs Lab 10/09/13 0615 10/10/13 0435 10/13/13 0550  WBC 8.7 8.3 3.4*  NEUTROABS 6.3  --   --   HGB 12.3 10.6* 9.6*  HCT 36.3 32.4* 28.1*  MCV 88.5 88.3 86.5  PLT 276 205 193    Cardiac Enzymes: No results found for this basename: CKTOTAL, CKMB, CKMBINDEX, TROPONINI,  in the last 168 hours BNP (last 3 results) No results found for this basename: PROBNP,  in the last 8760 hours   CBG: No results found for this basename: GLUCAP,  in the last 168 hours  No results found for this or any previous visit (from the past 240 hour(s)).   Studies: Ct Abdomen Pelvis W Contrast  10/09/2013   CLINICAL DATA:  Emesis.  EXAM: CT ABDOMEN AND PELVIS WITH CONTRAST  TECHNIQUE: Multidetector CT imaging of the abdomen and pelvis was performed using the standard  protocol following bolus administration of intravenous contrast.  CONTRAST:  196mL OMNIPAQUE IOHEXOL 300 MG/ML  SOLN  COMPARISON:  09/17/2013  FINDINGS: BODY WALL: Unremarkable.  LOWER CHEST: Unremarkable.  ABDOMEN/PELVIS:  Liver: No focal abnormality.  Biliary: No evidence of biliary obstruction or stone.  Pancreas: Unremarkable.  Spleen: Unremarkable.  Adrenals: Unremarkable.  Kidneys and ureters: No hydronephrosis or stone.  Bladder: Unremarkable.  Reproductive: Unremarkable.  Bowel: Circumferential thickening of the colon centered at the splenic flexure secondary to submucosal edema. Inflammatory changes are less extensive than previous. No diverticula in this region. No evidence of bowel necrosis or perforation. The major mesenteric arteries and veins are patent. No significant atherosclerotic change for age. No proximal obstruction. Negative appendix.  Retroperitoneum: No mass or adenopathy.  Peritoneum: Small volume pelvic ascites which is likely reactive.  Vascular: No acute abnormality.  OSSEOUS: Lower lumbar degenerative facet disease with mild anterolisthesis at L4-5. There is focally advanced degenerative disc narrowing at L5-S1.  IMPRESSION: Nonspecific colitis centered at the splenic flexure.   Electronically Signed   By: Jorje Guild M.D.   On: 10/09/2013 21:30   Ct Abdomen Pelvis W Contrast  09/17/2013   CLINICAL DATA:  Central abdominal pain and nausea. Left lower quadrant pain.  EXAM: CT ABDOMEN AND PELVIS WITH CONTRAST  TECHNIQUE: Multidetector CT imaging of the abdomen and pelvis was performed using the standard protocol following bolus administration of intravenous contrast.  CONTRAST:  139mL OMNIPAQUE IOHEXOL 300 MG/ML SOLN, 53mL OMNIPAQUE IOHEXOL 300 MG/ML SOLN  COMPARISON:  01/04/2011  FINDINGS: Mild dependent atelectasis in the lung bases. Small amount of free fluid around the spleen and tracking along the left pericolic gutter into the pelvis. The spleen appears intact. There is wall  thickening in the descending and transverse colon indicating infectious or inflammatory colitis. Diffusely stool-filled colon without distention. No pneumatosis or portal venous gas.  The liver, spleen, gallbladder, pancreas, adrenal glands, kidneys, abdominal aorta, inferior vena cava, and retroperitoneal lymph nodes are unremarkable. Stomach and small bowel are not abnormally distended. No free air in the abdomen.  Pelvis: No pelvic mass or lymphadenopathy. No bladder wall thickening. Appendix is not identified. No changes suggesting diverticulitis. Degenerative changes in the spine and hips. No destructive bone lesions appreciated. On  IMPRESSION: Stool-filled colon. Wall thickening in the transverse and descending colon suggesting infectious or inflammatory colitis. Small amount of free fluid along the spleen, left pericolic gutter, and pelvis is likely related to the inflammatory colonic process.   Electronically Signed   By: Lucienne Capers M.D.   On: 09/17/2013 06:33   Dg Abd 2 Views  10/09/2013   CLINICAL DATA:  Mid epigastric abdominal pain and nausea  EXAM: ABDOMEN - 2 VIEW  COMPARISON:  Prior CT abdomen/ pelvis 09/17/2013  FINDINGS: The lung bases are clear. The bowel gas pattern is not obstructed. No free air. Unremarkable colonic stool burden. No organomegaly, evidence of ascites or abnormal calcifications. No acute osseous abnormality.  IMPRESSION: Negative.   Electronically Signed   By: Jacqulynn Cadet M.D.   On: 10/09/2013 09:10   Ct Angio Abd/pel W/ And/or W/o  09/18/2013   CLINICAL DATA:  Evaluate for ischemic colitis  EXAM: CT ANGIOGRAPHY ABDOMEN AND PELVIS WITH CONTRAST  TECHNIQUE: Multidetector CT imaging of the abdomen and pelvis was performed using the standard protocol during bolus administration of intravenous contrast. Multiplanar reconstructed images including MIPs were obtained and reviewed to evaluate the vascular anatomy.  CONTRAST:  137mL OMNIPAQUE IOHEXOL 350 MG/ML SOLN   COMPARISON:  CT abdomen pelvis-earlier same day; 01/04/2011  FINDINGS: Vascular Findings:  Abdominal aorta: Scattered very minimal mixed calcified and noncalcified atherosclerotic plaque within a normal caliber abdominal aorta. No abdominal aortic dissection or periaortic stranding.  Celiac artery: There is a minimal amount of eccentric calcified plaque involving the cranial aspect of the origin the celiac artery, not resulting in hemodynamically significant stenosis. Conventional branching pattern.  SMA: There is a very minimal amount of eccentric calcified plaque involving the cranial aspect of the origin of the SMA, not resulting in a hemodynamically significant stenosis. The distal tributaries of the SMA appear widely patent and are without discrete intraluminal filling defect to suggest distal embolism.  Right Renal artery: Solitary; widely patent without hemodynamically significant stenosis.  Left Renal artery: Solitary; widely patent without hemodynamically significant stenosis.  IMA: Widely patent without hemodynamically significant stenosis. The distal tributaries of the IMA are widely patent without discrete intraluminal filling defect to suggest distal embolism.  Pelvic vasculature: The bilateral common, external and internal iliac arteries are of normal caliber and widely patent without hemodynamically significant stenosis.  Review of the MIP images confirms the above findings.   --------------------------------------------------------------------------------  Nonvascular Findings:  Grossly unchanged mild though rather diffuse bowel wall thickening primarily involving the splenic flexure of the colon (representative axial images 96 and 170, series 4, coronal images 29 and 53, series 8). These findings are again associated with a small amounts of adjacent mesenteric stranding and fluid which tracks throughout the left pericolic gutter and into the lower pelvis. No definable/drainable fluid collection. No  pneumoperitoneum, pneumatosis or portal venous gas.  The bowel is otherwise normal in course and caliber. Normal appearance of the retrocecal appendix. Moderate colonic stool burden without evidence of obstruction.  Normal hepatic contour. There is a very minimal amount of focal fatty infiltration adjacent to the fissure for ligamentum teres. No discrete hepatic lesions. There is a minimal amount of vicarious excretion of contrast seen within an otherwise normal-appearing gallbladder. No gallbladder wall thickening or pericholecystic fluid. No intra or extrahepatic biliary duct dilatation. No ascites.  There is symmetric enhancement and excretion of the bilateral kidneys. No definite renal stones on this postcontrast examination. No discrete renal lesions. No urinary obstruction or perinephric stranding. There is mild thickening of the medial limb of the left adrenal gland without discrete nodule. Normal appearance of the right adrenal gland, pancreas and spleen. There is a minimal amount of perisplenic fluid.  No retroperitoneal, mesenteric, pelvic or inguinal lymphadenopathy.  Normal appearance of the pelvic organs for age. No discrete adnexal lesion.  Limited visualization of the lower thorax  demonstrates minimal subsegmental atelectasis within the imaged bilateral lower lobes as well as the inferior segment of the lingula. No discrete focal airspace opacities. No pleural effusion.  Borderline cardiomegaly. Trace amount of pericardial fluid, presumably physiologic.  No acute or aggressive osseus abnormalities. Mild to moderate multilevel lumbar spine DDD, worse at L5-S1 with disc space height loss, endplate irregularity and sclerosis. Regional soft tissues appear normal.  IMPRESSION: 1. Grossly unchanged mild though rather diffuse wall thickening extending from the transverse colon to the mid descending colon. There are no discrete intraluminal filling defects within the mesenteric vasculature to suggest embolism  as a source of mesenteric ischemia, though note, the distribution of colonic wall thickening could be seen in the setting of a watershed ischemic episode in the setting of profound hypotension. Clinical correlation is advised. In the absence of this clinical history, the colonic wall thickening is favored to be of either infectious and/or inflammatory etiology. 2. Grossly unchanged mesenteric stranding and fluid within the left pericolic gutter extending to the lower pelvis without definable/drainable fluid collection.   Electronically Signed   By: Sandi Mariscal M.D.   On: 09/18/2013 08:12    Scheduled Meds: . citalopram  20 mg Oral Daily  . enoxaparin (LOVENOX) injection  40 mg Subcutaneous Q24H  . feeding supplement (RESOURCE BREEZE)  1 Container Oral BID BM  . fluticasone  2 spray Each Nare Daily  . omega-3 acid ethyl esters  1 g Oral Daily  . polyethylene glycol  17 g Oral Daily   Continuous Infusions: . sodium chloride 50 mL/hr at 10/12/13 0542  . sodium chloride 20 mL/hr at 10/12/13 0840  . lactated ringers      Principal Problem:   Colitis Active Problems:   Osteoarthritis   Hyperlipidemia   Nausea vomiting and diarrhea   Colonic stricture    Time spent: 40 minutes   [facility] Pager [phone]. If 8PM-8AM, please contact night-coverage at [email or web address], password [facility] 10/13/2013, 11:41 AM  LOS: 4 days

## 2013-10-13 NOTE — Progress Notes (Signed)
Patient seen, examined, and I agree with the above documentation, including the assessment and plan. Proximal left colonic stricture, of unclear etiology.  Ischemic vs. Crohn's, less likely diverticular. Bx pending I spoke with Dr. Redmond Pulling who plans surgery, likely tomorrow.  We discussed that even if this Crohn's I would not expect medical management alone to significantly impaction colonic stricture.  If pathology does prove IBD, then she will need post-operative treatment to prevent recurrence once cleared by Dr. Redmond Pulling Clear liquid diet.

## 2013-10-14 ENCOUNTER — Encounter (HOSPITAL_COMMUNITY): Payer: Self-pay | Admitting: Critical Care Medicine

## 2013-10-14 ENCOUNTER — Encounter (HOSPITAL_COMMUNITY): Payer: 59 | Admitting: Critical Care Medicine

## 2013-10-14 ENCOUNTER — Encounter (HOSPITAL_COMMUNITY)
Admission: EM | Disposition: A | Discharge status: Home / Self Care | Payer: Self-pay | Source: Home / Self Care | Attending: Internal Medicine

## 2013-10-14 ENCOUNTER — Inpatient Hospital Stay (HOSPITAL_COMMUNITY): Payer: 59 | Admitting: Critical Care Medicine

## 2013-10-14 DIAGNOSIS — K5289 Other specified noninfective gastroenteritis and colitis: Secondary | ICD-10-CM

## 2013-10-14 HISTORY — PX: COLON RESECTION: SHX5231

## 2013-10-14 LAB — URINALYSIS, ROUTINE W REFLEX MICROSCOPIC
Bilirubin Urine: NEGATIVE
Glucose, UA: NEGATIVE mg/dL
Hgb urine dipstick: NEGATIVE
Ketones, ur: NEGATIVE mg/dL
Nitrite: NEGATIVE
PH: 5.5 (ref 5.0–8.0)
Protein, ur: NEGATIVE mg/dL
Specific Gravity, Urine: 1.016 (ref 1.005–1.030)
Urobilinogen, UA: 0.2 mg/dL (ref 0.0–1.0)

## 2013-10-14 LAB — URINE MICROSCOPIC-ADD ON

## 2013-10-14 LAB — CLOSTRIDIUM DIFFICILE BY PCR: Toxigenic C. Difficile by PCR: NEGATIVE

## 2013-10-14 SURGERY — COLON RESECTION LAPAROSCOPIC
Anesthesia: General | Site: Abdomen

## 2013-10-14 MED ORDER — ONDANSETRON HCL 4 MG/2ML IJ SOLN
INTRAMUSCULAR | Status: DC | PRN
  Administered 2013-10-14 (×2): 4 mg via INTRAVENOUS

## 2013-10-14 MED ORDER — FENTANYL CITRATE 0.05 MG/ML IJ SOLN
INTRAMUSCULAR | Status: AC
  Filled 2013-10-14: qty 5

## 2013-10-14 MED ORDER — CLINDAMYCIN PHOSPHATE 900 MG/50ML IV SOLN
900.0000 mg | Freq: Once | INTRAVENOUS | Status: DC
  Filled 2013-10-14: qty 50

## 2013-10-14 MED ORDER — ARTIFICIAL TEARS OP OINT
TOPICAL_OINTMENT | OPHTHALMIC | Status: AC
  Filled 2013-10-14: qty 3.5

## 2013-10-14 MED ORDER — ROCURONIUM BROMIDE 50 MG/5ML IV SOLN
INTRAVENOUS | Status: AC
Start: 2013-10-14 — End: 2013-10-14
  Filled 2013-10-14: qty 1

## 2013-10-14 MED ORDER — SODIUM CHLORIDE 0.9 % IR SOLN
Status: DC | PRN
  Administered 2013-10-14: 1000 mL

## 2013-10-14 MED ORDER — DEXAMETHASONE SODIUM PHOSPHATE 4 MG/ML IJ SOLN
INTRAMUSCULAR | Status: DC | PRN
  Administered 2013-10-14: 4 mg via INTRAVENOUS

## 2013-10-14 MED ORDER — PHENYLEPHRINE 40 MCG/ML (10ML) SYRINGE FOR IV PUSH (FOR BLOOD PRESSURE SUPPORT)
PREFILLED_SYRINGE | INTRAVENOUS | Status: AC
  Filled 2013-10-14: qty 10

## 2013-10-14 MED ORDER — HYDROMORPHONE HCL PF 1 MG/ML IJ SOLN
0.2500 mg | INTRAMUSCULAR | Status: DC | PRN
  Administered 2013-10-14 (×4): 0.5 mg via INTRAVENOUS

## 2013-10-14 MED ORDER — SUCCINYLCHOLINE CHLORIDE 20 MG/ML IJ SOLN
INTRAMUSCULAR | Status: AC
  Filled 2013-10-14: qty 1

## 2013-10-14 MED ORDER — GLYCOPYRROLATE 0.2 MG/ML IJ SOLN
INTRAMUSCULAR | Status: AC
  Filled 2013-10-14: qty 3

## 2013-10-14 MED ORDER — LACTATED RINGERS IV SOLN
INTRAVENOUS | Status: DC | PRN
  Administered 2013-10-14: 09:00:00 via INTRAVENOUS

## 2013-10-14 MED ORDER — SUCCINYLCHOLINE CHLORIDE 20 MG/ML IJ SOLN
INTRAMUSCULAR | Status: DC | PRN
  Administered 2013-10-14: 100 mg via INTRAVENOUS

## 2013-10-14 MED ORDER — SODIUM CHLORIDE 0.9 % IJ SOLN
INTRAMUSCULAR | Status: AC
  Filled 2013-10-14: qty 10

## 2013-10-14 MED ORDER — NEOSTIGMINE METHYLSULFATE 10 MG/10ML IV SOLN
INTRAVENOUS | Status: DC | PRN
  Administered 2013-10-14: 4 mg via INTRAVENOUS

## 2013-10-14 MED ORDER — ONDANSETRON HCL 4 MG/2ML IJ SOLN
INTRAMUSCULAR | Status: AC
  Filled 2013-10-14: qty 2

## 2013-10-14 MED ORDER — GLYCOPYRROLATE 0.2 MG/ML IJ SOLN
INTRAMUSCULAR | Status: DC | PRN
  Administered 2013-10-14 (×2): 0.2 mg via INTRAVENOUS
  Administered 2013-10-14: 0.6 mg via INTRAVENOUS

## 2013-10-14 MED ORDER — PHENYLEPHRINE HCL 10 MG/ML IJ SOLN
INTRAMUSCULAR | Status: DC | PRN
  Administered 2013-10-14 (×2): 40 ug via INTRAVENOUS

## 2013-10-14 MED ORDER — BUPIVACAINE-EPINEPHRINE 0.25% -1:200000 IJ SOLN
INTRAMUSCULAR | Status: DC | PRN
  Administered 2013-10-14: 30 mL

## 2013-10-14 MED ORDER — DEXAMETHASONE SODIUM PHOSPHATE 10 MG/ML IJ SOLN
INTRAMUSCULAR | Status: AC
  Filled 2013-10-14: qty 1

## 2013-10-14 MED ORDER — LIDOCAINE HCL (CARDIAC) 20 MG/ML IV SOLN
INTRAVENOUS | Status: AC
  Filled 2013-10-14: qty 5

## 2013-10-14 MED ORDER — MIDAZOLAM HCL 2 MG/2ML IJ SOLN
INTRAMUSCULAR | Status: AC
  Filled 2013-10-14: qty 2

## 2013-10-14 MED ORDER — ARTIFICIAL TEARS OP OINT
TOPICAL_OINTMENT | OPHTHALMIC | Status: DC | PRN
  Administered 2013-10-14: 1 via OPHTHALMIC

## 2013-10-14 MED ORDER — BUPIVACAINE-EPINEPHRINE (PF) 0.25% -1:200000 IJ SOLN
INTRAMUSCULAR | Status: AC
  Filled 2013-10-14: qty 30

## 2013-10-14 MED ORDER — EPHEDRINE SULFATE 50 MG/ML IJ SOLN
INTRAMUSCULAR | Status: AC
  Filled 2013-10-14: qty 1

## 2013-10-14 MED ORDER — DEXTROSE-NACL 5-0.9 % IV SOLN
INTRAVENOUS | Status: DC
  Administered 2013-10-14 – 2013-10-19 (×11): via INTRAVENOUS

## 2013-10-14 MED ORDER — ALVIMOPAN 12 MG PO CAPS: 12.0000 mg | ORAL_CAPSULE | Freq: Two times a day (BID) | ORAL | Status: DC

## 2013-10-14 MED ORDER — FENTANYL CITRATE 0.05 MG/ML IJ SOLN
INTRAMUSCULAR | Status: DC | PRN
  Administered 2013-10-14: 125 ug via INTRAVENOUS
  Administered 2013-10-14: 50 ug via INTRAVENOUS
  Administered 2013-10-14: 75 ug via INTRAVENOUS
  Administered 2013-10-14 (×3): 50 ug via INTRAVENOUS

## 2013-10-14 MED ORDER — GLYCOPYRROLATE 0.2 MG/ML IJ SOLN
INTRAMUSCULAR | Status: AC
  Filled 2013-10-14: qty 1

## 2013-10-14 MED ORDER — EPHEDRINE SULFATE 50 MG/ML IJ SOLN
INTRAMUSCULAR | Status: DC | PRN
  Administered 2013-10-14 (×5): 5 mg via INTRAVENOUS

## 2013-10-14 MED ORDER — ROCURONIUM BROMIDE 50 MG/5ML IV SOLN
INTRAVENOUS | Status: AC
  Filled 2013-10-14: qty 1

## 2013-10-14 MED ORDER — PROPOFOL 10 MG/ML IV BOLUS
INTRAVENOUS | Status: AC
  Filled 2013-10-14: qty 20

## 2013-10-14 MED ORDER — GENTAMICIN SULFATE 40 MG/ML IJ SOLN
5.0000 mg/kg | Freq: Once | INTRAVENOUS | Status: DC
  Filled 2013-10-14: qty 12.75

## 2013-10-14 MED ORDER — LIDOCAINE HCL (CARDIAC) 20 MG/ML IV SOLN
INTRAVENOUS | Status: DC | PRN
  Administered 2013-10-14: 80 mg via INTRAVENOUS
  Administered 2013-10-14: 100 mg via INTRATRACHEAL

## 2013-10-14 MED ORDER — NEOSTIGMINE METHYLSULFATE 10 MG/10ML IV SOLN
INTRAVENOUS | Status: AC
  Filled 2013-10-14: qty 1

## 2013-10-14 MED ORDER — ROCURONIUM BROMIDE 100 MG/10ML IV SOLN
INTRAVENOUS | Status: DC | PRN
  Administered 2013-10-14: 40 mg via INTRAVENOUS
  Administered 2013-10-14: 5 mg via INTRAVENOUS
  Administered 2013-10-14 (×2): 10 mg via INTRAVENOUS
  Administered 2013-10-14 (×2): 5 mg via INTRAVENOUS
  Administered 2013-10-14 (×2): 10 mg via INTRAVENOUS

## 2013-10-14 MED ORDER — LACTATED RINGERS IV SOLN
INTRAVENOUS | Status: DC | PRN
  Administered 2013-10-14 (×2): via INTRAVENOUS

## 2013-10-14 MED ORDER — HYDROMORPHONE HCL PF 1 MG/ML IJ SOLN
INTRAMUSCULAR | Status: AC
  Filled 2013-10-14: qty 1

## 2013-10-14 MED ORDER — PROPOFOL 10 MG/ML IV BOLUS
INTRAVENOUS | Status: DC | PRN
  Administered 2013-10-14: 180 mg via INTRAVENOUS

## 2013-10-14 MED ORDER — ONDANSETRON HCL 4 MG/2ML IJ SOLN: 4.0000 mg | Freq: Once | INTRAMUSCULAR | Status: DC | PRN

## 2013-10-14 SURGICAL SUPPLY — 87 items
APPLIER CLIP 5 13 M/L LIGAMAX5 (MISCELLANEOUS)
APPLIER CLIP ROT 10 11.4 M/L (STAPLE)
BLADE SURG ROTATE 9660 (MISCELLANEOUS) ×2 IMPLANT
CANISTER SUCTION 2500CC (MISCELLANEOUS) ×2 IMPLANT
CELLS DAT CNTRL 66122 CELL SVR (MISCELLANEOUS) IMPLANT
CHLORAPREP W/TINT 26ML (MISCELLANEOUS) ×2 IMPLANT
CLIP APPLIE 5 13 M/L LIGAMAX5 (MISCELLANEOUS) IMPLANT
CLIP APPLIE ROT 10 11.4 M/L (STAPLE) IMPLANT
COVER MAYO STAND STRL (DRAPES) ×2 IMPLANT
COVER SURGICAL LIGHT HANDLE (MISCELLANEOUS) ×4 IMPLANT
DISSECTOR BLUNT TIP ENDO 5MM (MISCELLANEOUS) IMPLANT
DRAPE PROXIMA HALF (DRAPES) IMPLANT
DRAPE SURG 17X23 STRL (DRAPES) ×2 IMPLANT
DRAPE UTILITY 15X26 W/TAPE STR (DRAPE) ×10 IMPLANT
DRAPE WARM FLUID 44X44 (DRAPE) ×2 IMPLANT
DRSG OPSITE POSTOP 4X10 (GAUZE/BANDAGES/DRESSINGS) IMPLANT
DRSG OPSITE POSTOP 4X8 (GAUZE/BANDAGES/DRESSINGS) ×2 IMPLANT
ELECT BLADE 6.5 EXT (BLADE) ×2 IMPLANT
ELECT CAUTERY BLADE 6.4 (BLADE) ×4 IMPLANT
ELECT REM PT RETURN 9FT ADLT (ELECTROSURGICAL) ×2
ELECTRODE REM PT RTRN 9FT ADLT (ELECTROSURGICAL) ×1 IMPLANT
GAUZE SPONGE 2X2 8PLY STRL LF (GAUZE/BANDAGES/DRESSINGS) ×1 IMPLANT
GEL ULTRASOUND 20GR AQUASONIC (MISCELLANEOUS) IMPLANT
GLOVE BIO SURGEON STRL SZ 6.5 (GLOVE) ×4 IMPLANT
GLOVE BIOGEL M 8.0 STRL (GLOVE) ×4 IMPLANT
GLOVE BIOGEL M STRL SZ7.5 (GLOVE) ×4 IMPLANT
GLOVE BIOGEL PI IND STRL 7.0 (GLOVE) ×2 IMPLANT
GLOVE BIOGEL PI IND STRL 8 (GLOVE) ×3 IMPLANT
GLOVE BIOGEL PI INDICATOR 7.0 (GLOVE) ×2
GLOVE BIOGEL PI INDICATOR 8 (GLOVE) ×3
GLOVE ECLIPSE 7.5 STRL STRAW (GLOVE) ×2 IMPLANT
GLOVE SS BIOGEL STRL SZ 6.5 (GLOVE) ×1 IMPLANT
GLOVE SUPERSENSE BIOGEL SZ 6.5 (GLOVE) ×1
GLOVE SURG ORTHO 8.0 STRL STRW (GLOVE) ×2 IMPLANT
GLOVE SURG SS PI 7.0 STRL IVOR (GLOVE) ×6 IMPLANT
GOWN STRL REUS W/ TWL LRG LVL3 (GOWN DISPOSABLE) ×3 IMPLANT
GOWN STRL REUS W/ TWL XL LVL3 (GOWN DISPOSABLE) ×3 IMPLANT
GOWN STRL REUS W/TWL LRG LVL3 (GOWN DISPOSABLE) ×3
GOWN STRL REUS W/TWL XL LVL3 (GOWN DISPOSABLE) ×3
KIT BASIN OR (CUSTOM PROCEDURE TRAY) ×2 IMPLANT
KIT ROOM TURNOVER OR (KITS) ×2 IMPLANT
LEGGING LITHOTOMY PAIR STRL (DRAPES) ×4 IMPLANT
LIGASURE 5MM LAPAROSCOPIC (INSTRUMENTS) IMPLANT
LIGASURE IMPACT 36 18CM CVD LR (INSTRUMENTS) ×2 IMPLANT
NS IRRIG 1000ML POUR BTL (IV SOLUTION) ×4 IMPLANT
PAD ARMBOARD 7.5X6 YLW CONV (MISCELLANEOUS) ×2 IMPLANT
PENCIL BUTTON HOLSTER BLD 10FT (ELECTRODE) ×4 IMPLANT
RELOAD PROXIMATE 75MM BLUE (ENDOMECHANICALS) ×4 IMPLANT
RTRCTR WOUND ALEXIS 18CM MED (MISCELLANEOUS)
SCALPEL HARMONIC ACE (MISCELLANEOUS) ×2 IMPLANT
SCISSORS LAP 5X35 DISP (ENDOMECHANICALS) ×2 IMPLANT
SET IRRIG TUBING LAPAROSCOPIC (IRRIGATION / IRRIGATOR) ×2 IMPLANT
SLEEVE ENDOPATH XCEL 5M (ENDOMECHANICALS) ×2 IMPLANT
SPECIMEN JAR LARGE (MISCELLANEOUS) IMPLANT
SPONGE GAUZE 2X2 STER 10/PKG (GAUZE/BANDAGES/DRESSINGS) ×1
STAPLER GUN LINEAR PROX 60 (STAPLE) ×2 IMPLANT
STAPLER PROXIMATE 75MM BLUE (STAPLE) ×2 IMPLANT
STAPLER VISISTAT 35W (STAPLE) ×4 IMPLANT
SUCTION POOLE TIP (SUCTIONS) ×2 IMPLANT
SURGILUBE 2OZ TUBE FLIPTOP (MISCELLANEOUS) ×2 IMPLANT
SUT PDS AB 1 TP1 96 (SUTURE) ×4 IMPLANT
SUT PROLENE 2 0 CT2 30 (SUTURE) IMPLANT
SUT PROLENE 2 0 KS (SUTURE) IMPLANT
SUT PROLENE 3 0 RB 1 (SUTURE) ×2 IMPLANT
SUT SILK 2 0 (SUTURE) ×1
SUT SILK 2 0 SH CR/8 (SUTURE) ×4 IMPLANT
SUT SILK 2-0 18XBRD TIE 12 (SUTURE) ×1 IMPLANT
SUT SILK 3 0 (SUTURE) ×1
SUT SILK 3 0 SH CR/8 (SUTURE) ×4 IMPLANT
SUT SILK 3-0 18XBRD TIE 12 (SUTURE) ×1 IMPLANT
SUT VIC AB 3-0 SH 8-18 (SUTURE) IMPLANT
SYR BULB IRRIGATION 50ML (SYRINGE) ×2 IMPLANT
SYS LAPSCP GELPORT 120MM (MISCELLANEOUS) ×2
SYSTEM LAPSCP GELPORT 120MM (MISCELLANEOUS) ×1 IMPLANT
TAPE CLOTH SOFT 2X10 (GAUZE/BANDAGES/DRESSINGS) ×2 IMPLANT
TOWEL OR 17X26 10 PK STRL BLUE (TOWEL DISPOSABLE) ×4 IMPLANT
TRAY FOLEY CATH 16FRSI W/METER (SET/KITS/TRAYS/PACK) ×2 IMPLANT
TRAY LAPAROSCOPIC (CUSTOM PROCEDURE TRAY) ×2 IMPLANT
TRAY PROCTOSCOPIC FIBER OPTIC (SET/KITS/TRAYS/PACK) IMPLANT
TROCAR BALLN 12MMX100 BLUNT (TROCAR) ×2 IMPLANT
TROCAR XCEL 12X100 BLDLESS (ENDOMECHANICALS) IMPLANT
TROCAR XCEL BLUNT TIP 100MML (ENDOMECHANICALS) ×2 IMPLANT
TROCAR XCEL NON-BLD 11X100MML (ENDOMECHANICALS) IMPLANT
TROCAR XCEL NON-BLD 5MMX100MML (ENDOMECHANICALS) ×2 IMPLANT
TUBE CONNECTING 12X1/4 (SUCTIONS) ×4 IMPLANT
TUBING FILTER THERMOFLATOR (ELECTROSURGICAL) ×2 IMPLANT
YANKAUER SUCT BULB TIP NO VENT (SUCTIONS) ×4 IMPLANT

## 2013-10-14 NOTE — H&P (View-Only) (Signed)
[name], female   DOB: 1955/07/28, 58 y.o.   MRN: [id number]     [facility]      [address] [place], [place] [address]    Phone: [phone] FAX: [phone]     Subjective: Had a BM.  No n/v.  Sore, but not much pain.  Ambulating in hallways.    Objective:  Vital signs:  Filed Vitals:   10/12/13 1737 10/12/13 2048 10/13/13 0220 10/13/13 0619  BP: 141/64 113/57 127/53 131/61  Pulse: 84 88 77 66  Temp: 98 F (36.7 C) 98.9 F (37.2 C) 98.4 F (36.9 C) 98.5 F (36.9 C)  TempSrc: Oral Oral Oral Oral  Resp: '14 16 16 16  ' Height:      Weight:      SpO2: 96% 100% 99% 98%    Last BM Date: 10/12/13  Intake/Output   Yesterday:  08/05 0701 - 08/06 0700 In: 740 [P.O.:240; I.V.:500] Out: 1600 [Urine:1600] This shift:    I/O last 3 completed shifts: In: 2887 [P.O.:243; I.V.:2644] Out: 1600 [Urine:1600]    Physical Exam: General: Pt awake/alert/oriented x4 in no acute distress Abdomen: Soft.  Nondistended.  Non tender.   No evidence of peritonitis.  No incarcerated hernias.   Problem List:   Principal Problem:   Colitis Active Problems:   Osteoarthritis   Hyperlipidemia   Nausea vomiting and diarrhea   Colonic stricture    Results:   Labs: Results for orders placed during the hospital encounter of 10/09/13 (from the past 48 hour(s))  COMPREHENSIVE METABOLIC PANEL     Status: Abnormal   Collection Time    10/13/13  5:50 AM      Result Value Ref Range   Sodium 142  137 - 147 mEq/L   Potassium 3.7  3.7 - 5.3 mEq/L   Chloride 105  96 - 112 mEq/L   CO2 28  19 - 32 mEq/L   Glucose, Bld 93  70 - 99 mg/dL   BUN 4 (*) 6 - 23 mg/dL   Creatinine, Ser 0.61  0.50 - 1.10 mg/dL   Calcium 8.6  8.4 - 10.5 mg/dL   Total Protein 5.3 (*) 6.0 - 8.3 g/dL   Albumin 2.6 (*) 3.5 - 5.2 g/dL   AST 8  0 - 37 U/L   ALT 8  0 - 35 U/L   Alkaline Phosphatase 44  39 - 117 U/L   Total Bilirubin 0.3  0.3 - 1.2 mg/dL   GFR calc non Af Amer >90  >90 mL/min   GFR calc Af Amer >90  >90 mL/min   Comment: (NOTE)     The eGFR has been calculated using the CKD EPI equation.     This calculation has not been validated in all clinical situations.     eGFR's persistently <90 mL/min signify possible Chronic Kidney     Disease.   Anion gap 9  5 - 15  CBC     Status: Abnormal   Collection Time    10/13/13  5:50 AM      Result Value Ref Range   WBC 3.4 (*) 4.0 - 10.5 K/uL   RBC 3.25 (*) 3.87 - 5.11 MIL/uL   Hemoglobin 9.6 (*) 12.0 - 15.0 g/dL   HCT 28.1 (*) 36.0 - 46.0 %   MCV 86.5  78.0 - 100.0 fL   MCH 29.5  26.0 - 34.0 pg   MCHC 34.2  30.0 - 36.0  g/dL   RDW 12.6  11.5 - 15.5 %   Platelets 193  150 - 400 K/uL    Imaging / Studies: No results found.  Medications / Allergies:  Scheduled Meds: . citalopram  20 mg Oral Daily  . enoxaparin (LOVENOX) injection  40 mg Subcutaneous Q24H  . feeding supplement (ENSURE COMPLETE)  237 mL Oral BID BM  . fluticasone  2 spray Each Nare Daily  . omega-3 acid ethyl esters  1 g Oral Daily  . polyethylene glycol  17 g Oral Daily   Continuous Infusions: . sodium chloride 50 mL/hr at 10/12/13 0542  . sodium chloride 20 mL/hr at 10/12/13 0840  . lactated ringers     PRN Meds:.acetaminophen, acetaminophen, alum & mag hydroxide-simeth, HYDROmorphone (DILAUDID) injection, hyoscyamine, ondansetron (ZOFRAN) IV, ondansetron, oxyCODONE  Antibiotics: Anti-infectives   Start     Dose/Rate Route Frequency Ordered Stop   10/13/13 0600  cefOXitin (MEFOXIN) 2 g in dextrose 5 % 50 mL IVPB  Status:  Discontinued    Comments:  Pharmacy may adjust dosing strength, interval, or rate of medication as needed for optimal therapy for the patient Send with patient on call to the OR.  Anesthesia to complete antibiotic administration <60mn [facility]   2 g 100 mL/hr over 30 Minutes Intravenous On call to O.R. 10/12/13 1552 10/12/13 1620   10/13/13 0600  cefoTEtan  (CEFOTAN) 1 g in dextrose 5 % 50 mL IVPB  Status:  Discontinued     1 g 100 mL/hr over 30 Minutes Intravenous On call to O.R. 10/12/13 1621 10/12/13 1721   10/12/13 2200  cefoTEtan (CEFOTAN) 1 g in dextrose 5 % 50 mL IVPB  Status:  Discontinued     1 g 100 mL/hr over 30 Minutes Intravenous Every 12 hours 10/12/13 1620 10/12/13 1621   10/09/13 2245  ciprofloxacin (CIPRO) IVPB 400 mg     400 mg 200 mL/hr over 60 Minutes Intravenous  Once 10/09/13 2232 10/10/13 0355   10/09/13 2245  metroNIDAZOLE (FLAGYL) IVPB 500 mg     500 mg 100 mL/hr over 60 Minutes Intravenous  Once 10/09/13 2232 10/10/13 0212      Assessment/Plan Stricture of colon (splenic flexure or proximal descending colon)  Colitis  Diarrhea -resolved Chronic constipation - likely due to stricture  Plan:  -Await biopsy results, will need Ex Lap with partial colectomy, will make her NPO after midnight in anticipation of biopsy results.  i have called pathology lab and they anticipate to have the results tomorrow morning.  -May have clears today -SCD/lovenox -miralax   EErby Pian [facility] Pager [phone] [phone] 10/13/2013 10:29 AM

## 2013-10-14 NOTE — H&P (View-Only) (Signed)
Pt doing well. No significant co  Discussed case with GI attending - does not feel that even if bx show IBD that biologic therapy would resolve splenic flexure stricture  Therefore rec proceeding with distal transverse/prox L colon resection in am. Pt desires surgery.  I discussed the procedure in detail.  The patient was shown colonic diagram.  We discussed the risks and benefits of surgery including, but not limited to bleeding, infection (such as wound infection, abdominal abscess), injury to surrounding structures, blood clot formation, urinary retention, incisional hernia, anastomotic stricture, anastomotic leak, anesthesia risks, pulmonary & cardiac complications such as pneumonia &/or heart attack, need for additional procedures, ileus, & prolonged hospitalization.  We discussed the typical postoperative recovery course, including limitations & restrictions postoperatively. I explained that the likelihood of improvement in their symptoms is good.  Type/screen entereg IV abx on call to OR preop subcu heparin dose Oral abx prep today Npo after MN Plan lap assisted colectomy in AM  Leighton Ruff. Redmond Pulling, MD, FACS General, Bariatric, & Minimally Invasive Surgery [facility], [place]

## 2013-10-14 NOTE — Anesthesia Procedure Notes (Signed)
Procedure Name: Intubation Date/Time: 10/14/2013 8:11 AM Performed by: Carola Frost Pre-anesthesia Checklist: Patient identified, Timeout performed, Emergency Drugs available, Suction available and Patient being monitored Patient Re-evaluated:Patient Re-evaluated prior to inductionOxygen Delivery Method: Circle system utilized Preoxygenation: Pre-oxygenation with 100% oxygen Intubation Type: IV induction Ventilation: Mask ventilation without difficulty Laryngoscope Size: Mac and 3 Grade View: Grade I Tube type: Oral Tube size: 7.5 mm Number of attempts: 1 Airway Equipment and Method: Stylet and LTA kit utilized Placement Confirmation: CO2 detector,  positive ETCO2,  ETT inserted through vocal cords under direct vision and breath sounds checked- equal and bilateral Secured at: 21 cm Tube secured with: Tape Dental Injury: Teeth and Oropharynx as per pre-operative assessment

## 2013-10-14 NOTE — Brief Op Note (Signed)
10/09/2013 - 10/14/2013  4:06 PM  PATIENT:  Monique Gonzalez  58 y.o. female  PRE-OPERATIVE DIAGNOSIS:  Splenic flexure colonic stricture with distal transverse colon colitis  POST-OPERATIVE DIAGNOSIS:  same  PROCEDURE:  Procedure(s): LAPAROSCOPIC MOBILIZATION OF SPLENIC FLEXURE, LAPAROSCOPIC EXTENDED LEFT COLECTOMY (N/A)  SURGEON:  Surgeon(s) and Role:    * [name], MD - Primary    * Earnstine Regal, MD - Assisting  PHYSICIAN ASSISTANT: none  ASSISTANTS: see above   ANESTHESIA:   general  EBL:  Total I/O In: 2150 [I.V.:2150] Out: 840 [Urine:765; Blood:75]  BLOOD ADMINISTERED:none  DRAINS: Urinary Catheter (Foley)   LOCAL MEDICATIONS USED:  MARCAINE     SPECIMEN:  Source of Specimen:  distal transverse colon and prox L colon; stitch proximal  DISPOSITION OF SPECIMEN:  PATHOLOGY  COUNTS:  YES  TOURNIQUET:  * No tourniquets in log *  DICTATION: .Other Dictation: Dictation Number [id number]  PLAN OF CARE: pacu then ward  PATIENT DISPOSITION:  PACU - hemodynamically stable.   Delay start of Pharmacological VTE agent (>24hrs) due to surgical blood loss or risk of bleeding: no  Leighton Ruff. Redmond Pulling, MD, FACS General, Bariatric, & Minimally Invasive Surgery [facility], [place]

## 2013-10-14 NOTE — Interval H&P Note (Signed)
History and Physical Interval Note:  10/14/2013 7:54 AM  Monique Gonzalez  has presented today for surgery, with the diagnosis of colonic stricture  The various methods of treatment have been discussed with the patient and family. After consideration of risks, benefits and other options for treatment, the patient has consented to  Procedure(s): LAPAROSCOPIC ASSISTED COLON RESECTION (N/A) as a surgical intervention .  The patient's history has been reviewed, patient examined, no change in status, stable for surgery.  I have reviewed the patient's chart and labs.  Questions were answered to the patient's satisfaction.    Leighton Ruff. Redmond Pulling, MD, [place], Bariatric, & Minimally Invasive Surgery [facility] Surgery, [place]   [facility]

## 2013-10-14 NOTE — Transfer of Care (Signed)
Immediate Anesthesia Transfer of Care Note  Patient: Monique Gonzalez  Procedure(s) Performed: Procedure(s): LAPAROSCOPIC MOBILIZATION OF SPLENIC FLEXURE, LAPAROSCOPIC EXTENDED LEFT COLECTOMY (N/A)  Patient Location: PACU  Anesthesia Type:General  Level of Consciousness: awake  Airway & Oxygen Therapy: Patient Spontanous Breathing and Patient connected to nasal cannula oxygen  Post-op Assessment: Report given to PACU RN, Post -op Vital signs reviewed and stable and Patient moving all extremities X 4  Post vital signs: Reviewed and stable  Complications: No apparent anesthesia complications

## 2013-10-14 NOTE — Anesthesia Postprocedure Evaluation (Signed)
  Anesthesia Post-op Note  Patient: Monique Gonzalez  Procedure(s) Performed: Procedure(s): LAPAROSCOPIC MOBILIZATION OF SPLENIC FLEXURE, LAPAROSCOPIC EXTENDED LEFT COLECTOMY (N/A)  Patient Location: PACU  Anesthesia Type:General  Level of Consciousness: awake, alert  and oriented  Airway and Oxygen Therapy: Patient Spontanous Breathing and Patient connected to nasal cannula oxygen  Post-op Pain: mild  Post-op Assessment: Post-op Vital signs reviewed, Patient's Cardiovascular Status Stable, Respiratory Function Stable, Patent Airway and Pain level controlled  Post-op Vital Signs: stable  Last Vitals:  Filed Vitals:   10/14/13 1512  BP: 138/62  Pulse: 79  Temp: 36.6 C  Resp: 19    Complications: No apparent anesthesia complications

## 2013-10-14 NOTE — Progress Notes (Addendum)
I have requested general surgery, Dr. Redmond Pulling to take over as the primary attending post op No active medical issues at this time  Transfer summary 58 y.o. white female with a history of HLD, and Osteoarthritis who presented to the [facility] the morning of 10/10/13 due to 2 days of N/V/D and abdominal pain. A CT scan of the ABD revealed inflammation and thickening of the colon at the splenic flexure. Was diagnosed with colitis, but CT showed improvement from previous CT thus she was discharged home with antibiotics. She returned to the ED due to worsening of her symptoms. She was not able to hold down foods or liquids or able to take her colon prep for her colonoscopy which was scheduled for the AM with Dr. Ardis Hughs. She was placed on IV Cipro and Flagyl and referred for admission to [facility]. She's complained of severe abdominal pain mostly in RLQ for 3 year associated with nausea and vomiting.  She underwent colonoscopy on 8/5  by Dr. Hilarie Fredrickson which revealed stenosis around the splenic flexure and patchy colitis distal to the stricture in the proximal descending colon. Multiple biopsies were also taken during this procedure, biopsies pending. C diff negative  She only received one day of cipro and flagyl during this admission Antibiotics were not continued after that Repeat stool studies have been negative   General surgery was consulted  For Stenosis (narrowing) at the splenic flexure  Patient underwent LAPAROSCOPIC ASSISTED COLON RESECTION on 8/7  Patient transferred to General surgery post Op. Ok to continue all home meds, her chronic issues are stable

## 2013-10-14 NOTE — Anesthesia Preprocedure Evaluation (Addendum)
Anesthesia Evaluation  Patient identified by MRN, date of birth, ID band Patient awake    Reviewed: Allergy & Precautions, H&P , NPO status , Patient's Chart, lab work & pertinent test results  Airway Mallampati: III TM Distance: >3 FB Neck ROM: Full    Dental  (+) Dental Advisory Given, Teeth Intact   Pulmonary former smoker,          Cardiovascular     Neuro/Psych PSYCHIATRIC DISORDERS Depression    GI/Hepatic N/v, colonic stricture   Endo/Other  Morbid obesity  Renal/GU      Musculoskeletal   Abdominal   Peds  Hematology   Anesthesia Other Findings   Reproductive/Obstetrics                         Anesthesia Physical Anesthesia Plan  ASA: II  Anesthesia Plan: General   Post-op Pain Management:    Induction: Intravenous  Airway Management Planned: Oral ETT  Additional Equipment:   Intra-op Plan:   Post-operative Plan: Extubation in OR  Informed Consent: I have reviewed the patients History and Physical, chart, labs and discussed the procedure including the risks, benefits and alternatives for the proposed anesthesia with the patient or authorized representative who has indicated his/her understanding and acceptance.   Dental advisory given  Plan Discussed with: Anesthesiologist, Surgeon and CRNA  Anesthesia Plan Comments:        Anesthesia Quick Evaluation

## 2013-10-14 NOTE — Op Note (Signed)
[name], [name]:  [id number]  MEDICAL RECORD NO.:  [id number]  LOCATION:  6N06C                        FACILITY:  [facility]  PHYSICIAN:  [name] Monique Pulling, MD, FACSDATE OF BIRTH:  24-Nov-1955  DATE OF PROCEDURE:  10/14/2013 DATE OF DISCHARGE:                              OPERATIVE REPORT   PREOPERATIVE DIAGNOSIS:  Splenic flexure colonic stricture with distal transverse colon colitis.  POSTOPERATIVE DIAGNOSIS:  Splenic flexure colonic stricture with distal transverse colon colitis.  PROCEDURE: 1. Laparoscopic left colectomy. 2. Laparoscopic mobilization of splenic flexure.  SURGEON:  [name] Monique Pulling, MD, FACS  ASSISTANT SURGEON:  Monique Regal, MD FACS  ANESTHESIA:  General.  EBL:  75 mL.  SPECIMEN:  Distal transverse colon to upper left colon, stitch marks proximal margin.  FINDINGS:  The patient had a focal area of thick walled distal transverse splenic flexure colon.  It was thickened and edematous that appeared more like either colitis or some type of ischemic insult.  This mainly involved the distal transverse and the proximal left colon.  A side-to-side anastomosis was done from her proximal transverse colon involving the area of the right branch of the middle colic vessel to the upper sigmoid colon.  There were palpable pulses in the mesentery at each connection.  INDICATIONS FOR PROCEDURE:  The patient is a 58 year old female, who was admitted in July with a distal transverse colon colitis as well as a splenic flexure colitis.  It was unclear the etiology of this colitis. She had a CT angiogram which demonstrated no embolic phenomenon.  She bounced back a few days ago with ongoing abdominal issues and a colonoscopy was performed, which demonstrated a stricture at her splenic flexure that was unable to be traversed.  There was no evidence of a mass.  Multiple biopsies were obtained and the differential diagnosis was ischemic colitis versus  an inflammatory bowel disease.  After discussion with GI attending, it was felt that the stricture would not respond to biologic therapy even if this was inflammatory bowel disease. I recommended resection of this portion of her colon.  We discussed the risks and benefits of surgery including, but not limited to, bleeding, infection, injury to surrounding structures, anastomotic stricture, anastomotic leak, blood clot formation, postoperative ileus, intra- abdominal abscess, incisional hernia, urinary retention, need for additional procedures, perioperative cardiac and pulmonary complications, and remote chance of death.  She elected to proceed with surgery.  DESCRIPTION OF PROCEDURE:  She received 5000 units of heparin subcutaneously preoperatively as well as a dose of Entereg. Preoperatively, she also received a broad-spectrum IV antibiotics.  She was taken to the OR and placed supine on the table.  General endotracheal anesthesia was established.  A Foley catheter was placed. She was placed on lithotomy position with the appropriate padding and sequential compression devices.  Her left arm was tucked and her right arm was on the armboard.  A surgical time-out was performed.  Local was infiltrated at the base of her umbilicus after a surgical time-out was performed.  A small vertical infraumbilical incision was made with #11 blade.  The fascia was grasped and lifted anteriorly.  The fascia was incised  with a #11 and the abdominal cavity was entered directly.  A pursestring suture was placed around the fascial edges and a 12-mm Hasson trocar was placed and pneumoperitoneum was freely established up to a patient pressure of 15 mmHg.  We had an ongoing air leak at this trocar site changed it out to an applied balloon Hasson trocar and there was no more air leak.  Two 5-mm trocars were placed, one in the right upper mid abdomen and one in the right lower quadrant all under  direct visualization.  It should be noted the patient had been taped to the bed across her upper chest with 2 pieces of rigid tape.  I identified the stomach, the transverse colon, the splenic flexure, the descending colon, and the sigmoid.  Fortunately, there was ample space between her splenocolic ligament around the spleen and the splenic flexure of the colon had ample window.  The thickened, hyperemic colon involved her distal transverse colon, splenic flexure, and portion of her descending colon. Her omentum was somewhat tethered and adhesed to her gastrocolic ligament.  I started by lifting up the gastrocolic ligament and the omentum and my assistant was able to pull down the transverse colon.  I started taking down this in a serial fashion with the Harmonic Scalpel, and I was able to get into the correct plane and started approaching the area towards the splenic flexure.  It should be noted that the distal transverse colon and the splenic flexure and proximal descending colon which were slightly hyperemic and thick walled.  There was one focal section that was hard and difficult to grasp with the grasper.  At this point, Dr. Harlow Gonzalez joined me in the operating room.  We took down the splenocolic ligament in a serial fashion using the Harmonic Scalpel.  I then came from a lateral approach of the left pericolic gutter  taking down the white line of Toldt up near the proximal left colon with the Harmonic Scalpel.  I was able to take down the avascular attachments with a combination of blunt moves with the suction irrigator catheter as well as with Harmonic Scalpel. The patient had been placed in reverse Trendelenburg.  Unfortunately, at this point, the patient slid somewhat off the OR table.  She did not come completely off the table.  She slid about 10 inches inferiorly off the table.  She was re-secured to the table and put in a supine position making sure that there was ample padding  underneath her buttocks.  At this point, we continued with the procedure. Dr. Harlow Gonzalez had left the operating room at this point.  I continued to mobilize the descending colon and the sigmoid colon along the lateral attachments by taking it down with a Harmonic scalpel and mobilizing it medially.  I then came back up to the transverse colon and started taking down more of the proximal transverse colon.  It appeared that we had adequate mobilization to bring it out around the midline.  At this point, pneumoperitoneum was released.  A small mini midline incision was made of about 8 cm incorporating the Hasson trocar site extending it above the umbilicus.  A wound protector was placed.  The colon of concern was exteriorized.  I found an area of proximal colon that was not thickened, the wall was soft, and that was at the proximal transverse colon at the level of the right branch of the middle colic vessel.  This was preserved and it was stapled just distal  to this with a GIA 75 stapler. I then took down the mesentery that was remaining with LigaSure device. Then, in the descending colon, I found an area in the upper sigmoid colon that was normal and also stapled it off with another fire of GIA 75 stapler.  At this point, I ran the small intestine to confirm there had been no small bowel injury during the course of the surgery and there was no evidence of enterotomy or small bowel injury.  At this point, I brought the proximal transverse colon together in a side-to- side fashion with the upper sigmoid colon.  Adequately, there was no tension line up the staple lines in a side-to-side fashion.  We placed blue towels around the area.  Enterotomies were made in each part in the sigmoid colon and proximal transverse colon.  One fork of each limb of the GIA 75 stapler was placed through the enterotomy, brought together and fired to create a common channel.  We got the staple lines aligned I did not  feel a TA stapler would work well, so therefore I closed the common opening with a running 3-0 Prolene and the secondary layer with interrupted 3-0 silks in a Lembert fashion.  A tension relieving suture was placed in the crotch of the anastomosis.  The mesenteric defect was Widely open and left open.  The abdomen was irrigated.  At this point, the wound protector and the electrocautery suction irrigator catheter were taken off the field.  We all changed gowns and gloves.  New drapes were applied and suction catheter were placed.  I then closed the mini midline incision with a #1 looped PDS from above and from below. Pneumoperitoneum was re-established and the midline closure was inspected.  There was nothing within the closure.  It was air tight.  We evacuated the remaining irrigation fluid.  Pneumoperitoneum was released.  The 3 trocar sites were closed with a skin staple.  Then, the mini midline incision was closed with skin staples as well.  A honeycomb dressing was placed to the midline incision and bandages were applied to the trocar sites.  The patient was extubated and brought to recovery in stable condition.  All needle, instrument, sponge counts were correct x2.     [name] Monique Pulling, MD, FACS     EMW/MEDQ  D:  10/14/2013  T:  10/14/2013  Job:  [id number]

## 2013-10-15 LAB — BASIC METABOLIC PANEL
ANION GAP: 9 (ref 5–15)
BUN: 3 mg/dL — ABNORMAL LOW (ref 6–23)
CALCIUM: 8.4 mg/dL (ref 8.4–10.5)
CHLORIDE: 104 meq/L (ref 96–112)
CO2: 28 meq/L (ref 19–32)
Creatinine, Ser: 0.66 mg/dL (ref 0.50–1.10)
GFR calc non Af Amer: >90 mL/min (ref >90)
Glucose, Bld: 121 mg/dL — ABNORMAL HIGH (ref 70–99)
Potassium: 3.7 mEq/L (ref 3.7–5.3)
SODIUM: 141 meq/L (ref 137–147)

## 2013-10-15 LAB — CBC
HEMATOCRIT: 30.9 % — AB (ref 36.0–46.0)
Hemoglobin: 10.3 g/dL — ABNORMAL LOW (ref 12.0–15.0)
MCH: 29.1 pg (ref 26.0–34.0)
MCHC: 33.3 g/dL (ref 30.0–36.0)
MCV: 87.3 fL (ref 78.0–100.0)
Platelets: 206 10*3/uL (ref 150–400)
RBC: 3.54 MIL/uL — ABNORMAL LOW (ref 3.87–5.11)
RDW: 12.7 % (ref 11.5–15.5)
WBC: 4.4 10*3/uL (ref 4.0–10.5)

## 2013-10-15 NOTE — Progress Notes (Signed)
Patient up tof bathroom with help states she can not void , MD called for further orders

## 2013-10-15 NOTE — Progress Notes (Signed)
1 Day Post-Op  Subjective: Doing well no flatus  Objective: Vital signs in last 24 hours: Temp:  [97.5 F (36.4 C)-98.9 F (37.2 C)] 98.3 F (36.8 C) (08/08 0957) Pulse Rate:  [61-82] 68 (08/08 0957) Resp:  [11-19] 16 (08/08 0957) BP: (115-138)/(49-63) 130/55 mmHg (08/08 0957) SpO2:  [94 %-100 %] 100 % (08/08 0957) Last BM Date: 10/14/13  Intake/Output from previous day: 08/07 0701 - 08/08 0700 In: 2572.9 [I.V.:2572.9] Out: 1865 [Urine:1790; Blood:75] Intake/Output this shift: Total I/O In: 120 [P.O.:120] Out: -   Incision/Wound:C/D/I soft minimally tender  Lab Results:   Recent Labs  10/13/13 0550 10/15/13 0530  WBC 3.4* 4.4  HGB 9.6* 10.3*  HCT 28.1* 30.9*  PLT 193 206   BMET  Recent Labs  10/13/13 0550 10/15/13 0530  NA 142 141  K 3.7 3.7  CL 105 104  CO2 28 28  GLUCOSE 93 121*  BUN 4* 3*  CREATININE 0.61 0.66  CALCIUM 8.6 8.4   PT/INR No results found for this basename: LABPROT, INR,  in the last 72 hours ABG No results found for this basename: PHART, PCO2, PO2, HCO3,  in the last 72 hours  Studies/Results: No results found.  Anti-infectives: Anti-infectives   Start     Dose/Rate Route Frequency Ordered Stop   10/14/13 1600  clindamycin (CLEOCIN) IVPB 900 mg     900 mg 100 mL/hr over 30 Minutes Intravenous  Once 10/14/13 1518     10/14/13 1600  gentamicin (GARAMYCIN) 510 mg in dextrose 5 % 100 mL IVPB     5 mg/kg  102.7 kg 112.8 mL/hr over 60 Minutes Intravenous  Once 10/14/13 1518     10/14/13 0600  clindamycin (CLEOCIN) IVPB 900 mg     900 mg 100 mL/hr over 30 Minutes Intravenous 60 min pre-op 10/13/13 1301 10/14/13 0708   10/14/13 0600  gentamicin (GARAMYCIN) 510 mg in dextrose 5 % 100 mL IVPB     5 mg/kg  102.7 kg 112.8 mL/hr over 60 Minutes Intravenous 60 min pre-op 10/13/13 1301 10/14/13 0645   10/13/13 1400  neomycin (MYCIFRADIN) tablet 1,000 mg     1,000 mg Oral 4 times per day on Thu 10/13/13 1301 10/13/13 2301   10/13/13  1400  metroNIDAZOLE (FLAGYL) tablet 1,000 mg     1,000 mg Oral 4 times per day on Thu 10/13/13 1301 10/13/13 2301   10/13/13 0600  cefOXitin (MEFOXIN) 2 g in dextrose 5 % 50 mL IVPB  Status:  Discontinued    Comments:  Pharmacy may adjust dosing strength, interval, or rate of medication as needed for optimal therapy for the patient Send with patient on call to the OR.  Anesthesia to complete antibiotic administration <53min prior to incision per [facility].   2 g 100 mL/hr over 30 Minutes Intravenous On call to O.R. 10/12/13 1552 10/12/13 1620   10/13/13 0600  cefoTEtan (CEFOTAN) 1 g in dextrose 5 % 50 mL IVPB  Status:  Discontinued     1 g 100 mL/hr over 30 Minutes Intravenous On call to O.R. 10/12/13 1621 10/12/13 1721   10/12/13 2200  cefoTEtan (CEFOTAN) 1 g in dextrose 5 % 50 mL IVPB  Status:  Discontinued     1 g 100 mL/hr over 30 Minutes Intravenous Every 12 hours 10/12/13 1620 10/12/13 1621   10/09/13 2245  ciprofloxacin (CIPRO) IVPB 400 mg     400 mg 200 mL/hr over 60 Minutes Intravenous  Once 10/09/13 2232 10/10/13 0355  10/09/13 2245  metroNIDAZOLE (FLAGYL) IVPB 500 mg     500 mg 100 mL/hr over 60 Minutes Intravenous  Once 10/09/13 2232 10/10/13 0212      Assessment/Plan: s/p Procedure(s): LAPAROSCOPIC MOBILIZATION OF SPLENIC FLEXURE, LAPAROSCOPIC EXTENDED LEFT COLECTOMY (N/A) OOB DVT prevention Ice chips  LOS: 6 days    Monique Kobus A. 10/15/2013

## 2013-10-15 NOTE — Progress Notes (Signed)
Patient still can not void MD([facility] called with results of bladder scan

## 2013-10-15 NOTE — Progress Notes (Signed)
Reinsert  Foley /Dr. Donne Hazel

## 2013-10-15 NOTE — Progress Notes (Signed)
MD Donne Hazel called back reported no void since foley out requested bladder scan and then call him back  650 cc noted  Patients say she is going to try and void

## 2013-10-16 NOTE — Progress Notes (Signed)
2 Days Post-Op  Subjective: No flatus or BM.  sore  Objective: Vital signs in last 24 hours: Temp:  [98 F (36.7 C)-98.7 F (37.1 C)] 98.6 F (37 C) (08/09 0541) Pulse Rate:  [66-75] 66 (08/09 0541) Resp:  [16-20] 20 (08/09 0541) BP: (130-135)/(58-69) 133/69 mmHg (08/09 0541) SpO2:  [96 %-100 %] 98 % (08/09 0541) Last BM Date: 10/14/13  Intake/Output from previous day: 08/08 0701 - 08/09 0700 In: 360 [P.O.:360] Out: 2150 [Urine:2150] Intake/Output this shift:    Incision/Wound:C/D/I soft mild distention  Lab Results:   Recent Labs  10/15/13 0530  WBC 4.4  HGB 10.3*  HCT 30.9*  PLT 206   BMET  Recent Labs  10/15/13 0530  NA 141  K 3.7  CL 104  CO2 28  GLUCOSE 121*  BUN 3*  CREATININE 0.66  CALCIUM 8.4   PT/INR No results found for this basename: LABPROT, INR,  in the last 72 hours ABG No results found for this basename: PHART, PCO2, PO2, HCO3,  in the last 72 hours  Studies/Results: No results found.  Anti-infectives: Anti-infectives   Start     Dose/Rate Route Frequency Ordered Stop   10/14/13 1600  clindamycin (CLEOCIN) IVPB 900 mg     900 mg 100 mL/hr over 30 Minutes Intravenous  Once 10/14/13 1518     10/14/13 1600  gentamicin (GARAMYCIN) 510 mg in dextrose 5 % 100 mL IVPB     5 mg/kg  102.7 kg 112.8 mL/hr over 60 Minutes Intravenous  Once 10/14/13 1518     10/14/13 0600  clindamycin (CLEOCIN) IVPB 900 mg     900 mg 100 mL/hr over 30 Minutes Intravenous 60 min pre-op 10/13/13 1301 10/14/13 0708   10/14/13 0600  gentamicin (GARAMYCIN) 510 mg in dextrose 5 % 100 mL IVPB     5 mg/kg  102.7 kg 112.8 mL/hr over 60 Minutes Intravenous 60 min pre-op 10/13/13 1301 10/14/13 0645   10/13/13 1400  neomycin (MYCIFRADIN) tablet 1,000 mg     1,000 mg Oral 4 times per day on Thu 10/13/13 1301 10/13/13 2301   10/13/13 1400  metroNIDAZOLE (FLAGYL) tablet 1,000 mg     1,000 mg Oral 4 times per day on Thu 10/13/13 1301 10/13/13 2301   10/13/13 0600   cefOXitin (MEFOXIN) 2 g in dextrose 5 % 50 mL IVPB  Status:  Discontinued    Comments:  Pharmacy may adjust dosing strength, interval, or rate of medication as needed for optimal therapy for the patient Send with patient on call to the OR.  Anesthesia to complete antibiotic administration <78min prior to incision per [facility]   2 g 100 mL/hr over 30 Minutes Intravenous On call to O.R. 10/12/13 1552 10/12/13 1620   10/13/13 0600  cefoTEtan (CEFOTAN) 1 g in dextrose 5 % 50 mL IVPB  Status:  Discontinued     1 g 100 mL/hr over 30 Minutes Intravenous On call to O.R. 10/12/13 1621 10/12/13 1721   10/12/13 2200  cefoTEtan (CEFOTAN) 1 g in dextrose 5 % 50 mL IVPB  Status:  Discontinued     1 g 100 mL/hr over 30 Minutes Intravenous Every 12 hours 10/12/13 1620 10/12/13 1621   10/09/13 2245  ciprofloxacin (CIPRO) IVPB 400 mg     400 mg 200 mL/hr over 60 Minutes Intravenous  Once 10/09/13 2232 10/10/13 0355   10/09/13 2245  metroNIDAZOLE (FLAGYL) IVPB 500 mg     500 mg 100 mL/hr over 60 Minutes Intravenous  Once 10/09/13 2232 10/10/13 0212      Assessment/Plan: s/p Procedure(s): LAPAROSCOPIC MOBILIZATION OF SPLENIC FLEXURE, LAPAROSCOPIC EXTENDED LEFT COLECTOMY (N/A) D/C foley OOB     LOS: 7 days    Monique Gonzalez A. 10/16/2013

## 2013-10-17 ENCOUNTER — Encounter (HOSPITAL_COMMUNITY): Payer: Self-pay | Admitting: General Surgery

## 2013-10-17 MED ORDER — BISACODYL 10 MG RE SUPP
10.0000 mg | Freq: Once | RECTAL | Status: AC
  Administered 2013-10-17: 10 mg via RECTAL
  Filled 2013-10-17: qty 1

## 2013-10-17 NOTE — Progress Notes (Signed)
3 Days Post-Op  Subjective: Stable and alert. Foley out and able to void. No stool or flatus. Beginning to ambulate. Tolerating ice chips without nausea. Anorexic.  Afebrile. Vital signs stable.  Objective: Vital signs in last 24 hours: Temp:  [98 F (36.7 C)-98.7 F (37.1 C)] 98.4 F (36.9 C) (08/10 0631) Pulse Rate:  [64-71] 64 (08/10 0631) Resp:  [17-18] 18 (08/10 0631) BP: (130-139)/(56-65) 130/62 mmHg (08/10 0631) SpO2:  [96 %-100 %] 99 % (08/10 0631) Last BM Date: 10/14/13  Intake/Output from previous day: 08/09 0701 - 08/10 0700 In: 5375 [I.V.:5375] Out: 3750 [Urine:3750] Intake/Output this shift:    General appearance: alert. Cooperative. Positive. Minimal distress. Mental status normal. GI: abdomen generally soft. Wounds looked good. Not distended.  Rare bowel sounds.  Lab Results:  No results found for this or any previous visit (from the past 24 hour(s)).   Studies/Results: No results found.  . bisacodyl  10 mg Rectal Once  . citalopram  20 mg Oral Daily  . clindamycin (CLEOCIN) IV  900 mg Intravenous Once   And  . gentamicin  5 mg/kg Intravenous Once  . enoxaparin (LOVENOX) injection  40 mg Subcutaneous Q24H  . fluticasone  2 spray Each Nare Daily  . omega-3 acid ethyl esters  1 g Oral Daily     Assessment/Plan: s/p Procedure(s): LAPAROSCOPIC MOBILIZATION OF SPLENIC FLEXURE, LAPAROSCOPIC EXTENDED LEFT COLECTOMY  POD #3. Resection splenic flexure with anastomosis for presumed ischemic stricture. Stable. Await resolution of expected ileus. Continue ice chip chips Try dulcolax supp. Ambulate more. Check pathology Labs tomorrow   @PROBHOSP @  LOS: 8 days    Reegan Bouffard M 10/17/2013  . .prob

## 2013-10-17 NOTE — Progress Notes (Signed)
Patient had c/o lightheadedness and dizziness after using the bathroom. Assessed patient and took vital signs at 1637pm. BP reading 93/57, P 67, R 18, T. 97.8, and O2 95 room air. Blood pressure reading at 1350pm was 141/78. MD Rosendo Gros notified. Ordered to standby and monitor patient. Call back if no change within 2 hours.

## 2013-10-18 LAB — BASIC METABOLIC PANEL
ANION GAP: 9 (ref 5–15)
BUN: <3 mg/dL — ABNORMAL LOW (ref 6–23)
CO2: 31 mEq/L (ref 19–32)
Calcium: 8.5 mg/dL (ref 8.4–10.5)
Chloride: 102 mEq/L (ref 96–112)
Creatinine, Ser: 0.55 mg/dL (ref 0.50–1.10)
GLUCOSE: 113 mg/dL — AB (ref 70–99)
POTASSIUM: 3.3 meq/L — AB (ref 3.7–5.3)
Sodium: 142 mEq/L (ref 137–147)

## 2013-10-18 LAB — CBC
HCT: 31 % — ABNORMAL LOW (ref 36.0–46.0)
HEMOGLOBIN: 10.3 g/dL — AB (ref 12.0–15.0)
MCH: 28.8 pg (ref 26.0–34.0)
MCHC: 33.2 g/dL (ref 30.0–36.0)
MCV: 86.6 fL (ref 78.0–100.0)
Platelets: 216 10*3/uL (ref 150–400)
RBC: 3.58 MIL/uL — ABNORMAL LOW (ref 3.87–5.11)
RDW: 12.5 % (ref 11.5–15.5)
WBC: 4.1 10*3/uL (ref 4.0–10.5)

## 2013-10-18 MED ORDER — POTASSIUM CHLORIDE CRYS ER 20 MEQ PO TBCR
40.0000 meq | EXTENDED_RELEASE_TABLET | Freq: Two times a day (BID) | ORAL | Status: AC
  Administered 2013-10-18 (×2): 40 meq via ORAL
  Filled 2013-10-18 (×2): qty 2

## 2013-10-18 NOTE — Progress Notes (Signed)
Agree with above, discussed path with patient

## 2013-10-18 NOTE — Progress Notes (Signed)
Came to visit patient at bedside to explain Link to Wellness program for [employer] employees/dependents with [facility]. Patient denies having any Link to Wellness needs at the time. However, agreeable to post hospital discharge call. Will follow up post discharge.  [facility] [phone]

## 2013-10-18 NOTE — Progress Notes (Signed)
[name], female   DOB: 1955-04-04, 58 y.o.   MRN: [id number] 4 Days Post-Op  Subjective: Pt having flatus and small liquid BM after suppository.  No nausea.  Objective: Vital signs in last 24 hours: Temp:  [97.8 F (36.6 C)-98.4 F (36.9 C)] 98.4 F (36.9 C) (08/11 0631) Pulse Rate:  [67-72] 67 (08/11 0631) Resp:  [16-18] 16 (08/11 0631) BP: (93-161)/(57-78) 132/69 mmHg (08/11 0631) SpO2:  [95 %-99 %] 97 % (08/11 0631) Last BM Date: 10/17/09  Intake/Output from previous day: 08/10 0701 - 08/11 0700 In: -  Out: 2025 [Urine:2025] Intake/Output this shift:    PE: Abd: soft, +BS, ND, appropriately tender, incisions c/d/i  Lab Results:   Recent Labs  10/18/13 0355  WBC 4.1  HGB 10.3*  HCT 31.0*  PLT 216   BMET  Recent Labs  10/18/13 0355  NA 142  K 3.3*  CL 102  CO2 31  GLUCOSE 113*  BUN <3*  CREATININE 0.55  CALCIUM 8.5   PT/INR No results found for this basename: LABPROT, INR,  in the last 72 hours CMP     Component Value Date/Time   NA 142 10/18/2013 0355   K 3.3* 10/18/2013 0355   CL 102 10/18/2013 0355   CO2 31 10/18/2013 0355   GLUCOSE 113* 10/18/2013 0355   BUN <3* 10/18/2013 0355   CREATININE 0.55 10/18/2013 0355   CREATININE 0.85 06/02/2013 0810   CALCIUM 8.5 10/18/2013 0355   PROT 5.3* 10/13/2013 0550   ALBUMIN 2.6* 10/13/2013 0550   AST 8 10/13/2013 0550   ALT 8 10/13/2013 0550   ALKPHOS 44 10/13/2013 0550   BILITOT 0.3 10/13/2013 0550   GFRNONAA >90 10/18/2013 0355   GFRNONAA 75 06/02/2013 0810   GFRAA >90 10/18/2013 0355   GFRAA 87 06/02/2013 0810   Lipase     Component Value Date/Time   LIPASE 33 10/09/2013 0615       Studies/Results: No results found.  Anti-infectives: Anti-infectives   Start     Dose/Rate Route Frequency Ordered Stop   10/14/13 1600  clindamycin (CLEOCIN) IVPB 900 mg     900 mg 100 mL/hr over 30 Minutes Intravenous  Once 10/14/13 1518     10/14/13 1600  gentamicin (GARAMYCIN) 510 mg in dextrose 5 % 100 mL  IVPB     5 mg/kg  102.7 kg 112.8 mL/hr over 60 Minutes Intravenous  Once 10/14/13 1518     10/14/13 0600  clindamycin (CLEOCIN) IVPB 900 mg     900 mg 100 mL/hr over 30 Minutes Intravenous 60 min pre-op 10/13/13 1301 10/14/13 0708   10/14/13 0600  gentamicin (GARAMYCIN) 510 mg in dextrose 5 % 100 mL IVPB     5 mg/kg  102.7 kg 112.8 mL/hr over 60 Minutes Intravenous 60 min pre-op 10/13/13 1301 10/14/13 0645   10/13/13 1400  neomycin (MYCIFRADIN) tablet 1,000 mg     1,000 mg Oral 4 times per day on Thu 10/13/13 1301 10/13/13 2301   10/13/13 1400  metroNIDAZOLE (FLAGYL) tablet 1,000 mg     1,000 mg Oral 4 times per day on Thu 10/13/13 1301 10/13/13 2301   10/13/13 0600  cefOXitin (MEFOXIN) 2 g in dextrose 5 % 50 mL IVPB  Status:  Discontinued    Comments:  Pharmacy may adjust dosing strength, interval, or rate of medication as needed for optimal therapy for the patient Send with patient on call to the OR.  Anesthesia to complete antibiotic administration <45min prior to  incision per Best Practice.   2 g 100 mL/hr over 30 Minutes Intravenous On call to O.R. 10/12/13 1552 10/12/13 1620   10/13/13 0600  cefoTEtan (CEFOTAN) 1 g in dextrose 5 % 50 mL IVPB  Status:  Discontinued     1 g 100 mL/hr over 30 Minutes Intravenous On call to O.R. 10/12/13 1621 10/12/13 1721   10/12/13 2200  cefoTEtan (CEFOTAN) 1 g in dextrose 5 % 50 mL IVPB  Status:  Discontinued     1 g 100 mL/hr over 30 Minutes Intravenous Every 12 hours 10/12/13 1620 10/12/13 1621   10/09/13 2245  ciprofloxacin (CIPRO) IVPB 400 mg     400 mg 200 mL/hr over 60 Minutes Intravenous  Once 10/09/13 2232 10/10/13 0355   10/09/13 2245  metroNIDAZOLE (FLAGYL) IVPB 500 mg     500 mg 100 mL/hr over 60 Minutes Intravenous  Once 10/09/13 2232 10/10/13 0212       Assessment/Plan   1. POD 4, s/p lap assisted left colectomy 2. Hypokalemia  Plan: 1. Adv to clears 2. Replace K+ 3. Decrease IVFs 4. Mobilize and pulm toilet 5. Path,  benign tissue  LOS: 9 days    Esiquio Boesen E 10/18/2013, 9:04 AM Pager: [phone]

## 2013-10-18 NOTE — Progress Notes (Signed)
UR COMPLETED  

## 2013-10-19 DIAGNOSIS — E876 Hypokalemia: Secondary | ICD-10-CM

## 2013-10-19 LAB — BASIC METABOLIC PANEL
Anion gap: 9 (ref 5–15)
BUN: <3 mg/dL — ABNORMAL LOW (ref 6–23)
CALCIUM: 8.5 mg/dL (ref 8.4–10.5)
CO2: 28 mEq/L (ref 19–32)
Chloride: 101 mEq/L (ref 96–112)
Creatinine, Ser: 0.57 mg/dL (ref 0.50–1.10)
Glucose, Bld: 104 mg/dL — ABNORMAL HIGH (ref 70–99)
POTASSIUM: 3.4 meq/L — AB (ref 3.7–5.3)
Sodium: 138 mEq/L (ref 137–147)

## 2013-10-19 LAB — MAGNESIUM: MAGNESIUM: 1.5 mg/dL (ref 1.5–2.5)

## 2013-10-19 MED ORDER — POTASSIUM CHLORIDE CRYS ER 20 MEQ PO TBCR
40.0000 meq | EXTENDED_RELEASE_TABLET | Freq: Once | ORAL | Status: AC
  Administered 2013-10-19: 40 meq via ORAL
  Filled 2013-10-19: qty 2

## 2013-10-19 NOTE — Progress Notes (Signed)
[facility] Progress Note  5 Days Post-Op  Subjective: Pt doing well.  Had a BM 2 days ago, none yesterday or today.  No N/V.  IS up to 2250.  Ambulated 2x in the halls yesterday.  Hungry and wanting more food.    Objective: Vital signs in last 24 hours: Temp:  [97.8 F (36.6 C)-98.6 F (37 C)] 97.8 F (36.6 C) (08/12 0559) Pulse Rate:  [66-68] 66 (08/12 0559) Resp:  [16-17] 17 (08/12 0559) BP: (118-178)/(54-68) 142/66 mmHg (08/12 0559) SpO2:  [99 %-100 %] 99 % (08/12 0559) Last BM Date: 10/17/13  Intake/Output from previous day: 08/11 0701 - 08/12 0700 In: 1970 [P.O.:720; I.V.:1250] Out: 2300 [Urine:2300] Intake/Output this shift:    PE: Gen:  Alert, NAD, pleasant Abd: Soft, NT/ND, +BS, no HSM, incisions C/D/I with honeycomb dressing in place   Lab Results:   Recent Labs  10/18/13 0355  WBC 4.1  HGB 10.3*  HCT 31.0*  PLT 216   BMET  Recent Labs  10/18/13 0355 10/19/13 0120  NA 142 138  K 3.3* 3.4*  CL 102 101  CO2 31 28  GLUCOSE 113* 104*  BUN <3* <3*  CREATININE 0.55 0.57  CALCIUM 8.5 8.5   PT/INR No results found for this basename: LABPROT, INR,  in the last 72 hours CMP     Component Value Date/Time   NA 138 10/19/2013 0120   K 3.4* 10/19/2013 0120   CL 101 10/19/2013 0120   CO2 28 10/19/2013 0120   GLUCOSE 104* 10/19/2013 0120   BUN <3* 10/19/2013 0120   CREATININE 0.57 10/19/2013 0120   CREATININE 0.85 06/02/2013 0810   CALCIUM 8.5 10/19/2013 0120   PROT 5.3* 10/13/2013 0550   ALBUMIN 2.6* 10/13/2013 0550   AST 8 10/13/2013 0550   ALT 8 10/13/2013 0550   ALKPHOS 44 10/13/2013 0550   BILITOT 0.3 10/13/2013 0550   GFRNONAA >90 10/19/2013 0120   GFRNONAA 75 06/02/2013 0810   GFRAA >90 10/19/2013 0120   GFRAA 87 06/02/2013 0810   Lipase     Component Value Date/Time   LIPASE 33 10/09/2013 0615       Studies/Results: No results found.  Anti-infectives: Anti-infectives   Start     Dose/Rate Route Frequency Ordered Stop   10/14/13 1600   clindamycin (CLEOCIN) IVPB 900 mg     900 mg 100 mL/hr over 30 Minutes Intravenous  Once 10/14/13 1518     10/14/13 1600  gentamicin (GARAMYCIN) 510 mg in dextrose 5 % 100 mL IVPB     5 mg/kg  102.7 kg 112.8 mL/hr over 60 Minutes Intravenous  Once 10/14/13 1518     10/14/13 0600  clindamycin (CLEOCIN) IVPB 900 mg     900 mg 100 mL/hr over 30 Minutes Intravenous 60 min pre-op 10/13/13 1301 10/14/13 0708   10/14/13 0600  gentamicin (GARAMYCIN) 510 mg in dextrose 5 % 100 mL IVPB     5 mg/kg  102.7 kg 112.8 mL/hr over 60 Minutes Intravenous 60 min pre-op 10/13/13 1301 10/14/13 0645   10/13/13 1400  neomycin (MYCIFRADIN) tablet 1,000 mg     1,000 mg Oral 4 times per day on Thu 10/13/13 1301 10/13/13 2301   10/13/13 1400  metroNIDAZOLE (FLAGYL) tablet 1,000 mg     1,000 mg Oral 4 times per day on Thu 10/13/13 1301 10/13/13 2301   10/13/13 0600  cefOXitin (MEFOXIN) 2 g in dextrose 5 % 50 mL IVPB  Status:  Discontinued  Comments:  Pharmacy may adjust dosing strength, interval, or rate of medication as needed for optimal therapy for the patient Send with patient on call to the OR.  Anesthesia to complete antibiotic administration <91min prior to incision per [facility].   2 g 100 mL/hr over 30 Minutes Intravenous On call to O.R. 10/12/13 1552 10/12/13 1620   10/13/13 0600  cefoTEtan (CEFOTAN) 1 g in dextrose 5 % 50 mL IVPB  Status:  Discontinued     1 g 100 mL/hr over 30 Minutes Intravenous On call to O.R. 10/12/13 1621 10/12/13 1721   10/12/13 2200  cefoTEtan (CEFOTAN) 1 g in dextrose 5 % 50 mL IVPB  Status:  Discontinued     1 g 100 mL/hr over 30 Minutes Intravenous Every 12 hours 10/12/13 1620 10/12/13 1621   10/09/13 2245  ciprofloxacin (CIPRO) IVPB 400 mg     400 mg 200 mL/hr over 60 Minutes Intravenous  Once 10/09/13 2232 10/10/13 0355   10/09/13 2245  metroNIDAZOLE (FLAGYL) IVPB 500 mg     500 mg 100 mL/hr over 60 Minutes Intravenous  Once 10/09/13 2232 10/10/13 0212        Assessment/Plan 1. Colonic stricture POD #5, s/p lap assisted left colectomy  2. Hypokalemia   Plan:  1. Adv to fulls, soft at dinner if tolerating 2. Replace K+ since stil 3.4 3. Decrease IVFs  4. Mobilize and pulm toilet  5. Path, benign tissue 6. Change midline dressing 7. SCD's and lovenox 8. Possibly home tomorrow    LOS: 10 days    Coralie Keens 10/19/2013, 8:39 AM Pager: [phone]

## 2013-10-19 NOTE — Progress Notes (Signed)
Agree with above 

## 2013-10-20 LAB — BASIC METABOLIC PANEL
ANION GAP: 11 (ref 5–15)
BUN: 4 mg/dL — ABNORMAL LOW (ref 6–23)
CALCIUM: 9 mg/dL (ref 8.4–10.5)
CO2: 28 mEq/L (ref 19–32)
CREATININE: 0.65 mg/dL (ref 0.50–1.10)
Chloride: 102 mEq/L (ref 96–112)
GFR calc Af Amer: >90 mL/min (ref >90)
Glucose, Bld: 90 mg/dL (ref 70–99)
Potassium: 4.2 mEq/L (ref 3.7–5.3)
Sodium: 141 mEq/L (ref 137–147)

## 2013-10-20 MED ORDER — OXYCODONE HCL 5 MG PO TABS: 5.0000 mg | ORAL_TABLET | Freq: Four times a day (QID) | ORAL | Status: DC | PRN

## 2013-10-20 NOTE — Discharge Summary (Signed)
[facility] Discharge Summary   [name] MRN: [id number] DOB/AGE: [birth year] 58 y.o.  Admit date: 10/09/2013 Discharge date: 10/20/2013  Admitting Diagnosis: Colonic Stricture Colitis Diarrhea Chronic constipation  Discharge Diagnosis Patient Active Problem List   Diagnosis Date Noted  . Colonic stricture 10/12/2013  . Nausea vomiting and diarrhea 10/10/2013  . Colitis 10/09/2013  . Constipation 09/20/2013  . Hypokalemia 09/17/2013  . Syncope and collapse 11/30/2012  . Recurrent cold sores   . Right knee pain 11/23/2011  . Osteoarthritis   . Depression   . Hyperlipidemia     Consultants GI - Dr. Hilarie Fredrickson Internal medicine - Dr. Karleen Hampshire and Dr. Arnoldo Morale  Imaging: No results found.  Procedures Dr. Redmond Pulling (10/14/13) - Laparoscopic assisted left colectomy and mobilization of the splenic flexure  Surgical Pathology: Colon, segmental resection, distal transverse and splenic flexure - ULCERATION WITH INFLAMMATION AND FIBROSIS. - FOUR BENIGN LYMPH NODES. - NO EVIDENCE OF MALIGNANCY.  Hospital Course:  58 y.o. white female with a history of HLD, and Osteoarthritis who presented to the [facility] the morning of 10/10/13 due to 2 days of N/V/D and abdominal pain. A CT scan of the ABD revealed inflammation and thickening of the colon at the splenic flexure. Was diagnosed with colitis, but CT showed improvement from previous CT thus she was discharged home with antibiotics. She returned to the ED due to worsening of her symptoms. She was not able to hold down foods or liquids or able to take her colon prep for her colonoscopy which was scheduled for the AM with Dr. Ardis Hughs. She was placed on IV Cipro and Flagyl and referred for admission to [facility]. She's complained of severe abdominal pain mostly in RLQ for 3 year associated with nausea and vomiting. C/o constipation and use of a lot of laxatives in order to have a BM. Has a BM every 1-2 days. Denies melena and  hematochzia. Reports one episode of non-bloody, non-bilious, no coffee ground emesis yesterday. Denies fever and chills. Denies lightheadedness, dizziness, chest pain, SOB or peripheral edema. Patient normally eats a fairly healthy diet as she has been working on weight loss over the past year (80lbs). Works as a Marine scientist in [employer] and is able to avoid strenuous activity post operatively. She does not have any risk factors for ischemia like AFIB.  Patient was able to tolerate colon prep yesterday and underwent colonoscopy today by Dr. Hilarie Fredrickson which revealed stenosis around the splenic flexure and patchy colitis distal to the stricture in the proximal descending colon. Multiple biopsies were also taken during this procedure, C.diff was negative.  Patient was admitted and underwent procedure listed above.  Tolerated procedure well and was transferred to the floor.  She experienced a post-operative ileus.  As pain and bowel function improved diet was advanced as tolerated.  She had trouble with hypokalemia, but this soon resolvedOn POD #7, the patient was voiding well, tolerating diet, ambulating well, pain well controlled, vital signs stable, incisions c/d/i and felt stable for discharge home.  Patient will follow up in our office in 3 weeks and knows to call with questions or concerns.   Physical Exam: General:  Alert, NAD, pleasant, comfortable Abd:  Soft, ND, mild tenderness, incisions C/D/I     Medication List    STOP taking these medications       HYDROcodone-acetaminophen 5-325 MG per tablet  Commonly known as:  NORCO/VICODIN      TAKE these medications       acyclovir  cream 5 %  Commonly known as:  ZOVIRAX  Apply 1 application topically every 3 (three) hours.     calcium-vitamin D 500-200 MG-UNIT per tablet  Commonly known as:  OSCAL WITH D  Take 1 tablet by mouth daily.     citalopram 20 MG tablet  Commonly known as:  CELEXA  Take 1 tablet (20 mg total) by mouth daily.      fish oil-omega-3 fatty acids 1000 MG capsule  Take 2 g by mouth daily.     fluticasone 50 MCG/ACT nasal spray  Commonly known as:  FLONASE  Place 2 sprays into both nostrils daily.     GLUCOSAMINE MSM COMPLEX Tabs  Take 1 tablet by mouth daily.     LINZESS 290 MCG Caps capsule  Generic drug:  Linaclotide  Take 290 mcg by mouth daily.     loratadine 10 MG tablet  Commonly known as:  CLARITIN  Take 10 mg by mouth daily.     Lysine 500 MG Tabs  Take 500 mg by mouth daily.     meloxicam 7.5 MG tablet  Commonly known as:  MOBIC  Take 1 tablet (7.5 mg total) by mouth daily.     MOVIPREP 100 G Solr  Generic drug:  peg 3350 powder  Take 1 kit (200 g total) by mouth once.     multivitamin,tx-minerals tablet  Take 1 tablet by mouth daily.     ondansetron 8 MG tablet  Commonly known as:  ZOFRAN  Take 1 tablet (8 mg total) by mouth 3 (three) times daily.     oxyCODONE 5 MG immediate release tablet  Commonly known as:  Oxy IR/ROXICODONE  Take 1 tablet (5 mg total) by mouth every 6 (six) hours as needed for moderate pain.     polyethylene glycol packet  Commonly known as:  MIRALAX / GLYCOLAX  Take 17 g by mouth daily.     valACYclovir 1000 MG tablet  Commonly known as:  VALTREX  Take 2,000 mg by mouth 2 (two) times daily as needed (for 2 doses for flare ups).     Vitamin D 1000 UNITS capsule  Take 2,000 Units by mouth daily.     vitamin E 400 UNIT capsule  Take 400 Units by mouth daily.         Follow-up Information   Follow up with [name], MD In 3 weeks. (For post-operation check)    Specialty:  General Surgery   Contact information:   [place]  [id number] [phone]       Follow up with [facility]. (For staple removal in 5 days with Dr. Dois Davenport nurse, the office should call with appt time and date)    Contact information:   [address] [address] [phone]       Signed: Excell Seltzer [facility] [phone]  10/20/2013, 8:30 AM

## 2013-10-20 NOTE — Discharge Instructions (Signed)
[facility]      [facility] [phone]  OPEN ABDOMINAL SURGERY: POST OP INSTRUCTIONS  Always review your discharge instruction sheet given to you by the facility where your surgery was performed.  IF YOU HAVE DISABILITY OR FAMILY LEAVE FORMS, YOU MUST BRING THEM TO THE OFFICE FOR PROCESSING.  PLEASE DO NOT GIVE THEM TO YOUR DOCTOR.  1. A prescription for pain medication may be given to you upon discharge.  Take your pain medication as prescribed, if needed.  If narcotic pain medicine is not needed, then you may take acetaminophen (Tylenol) or ibuprofen (Advil) as needed. 2. Take your usually prescribed medications unless otherwise directed. 3. If you need a refill on your pain medication, please contact your pharmacy. They will contact our office to request authorization.  Prescriptions will not be filled after 5pm or on week-ends. 4. You should follow a light diet the first few days after arrival home, such as soup and crackers, pudding, etc.unless your doctor has advised otherwise. A high-fiber, low fat diet can be resumed as tolerated.   Be sure to include lots of fluids daily. Most patients will experience some swelling and bruising on the chest and neck area.  Ice packs will help.  Swelling and bruising can take several days to resolve 5. Most patients will experience some swelling and bruising in the area of the incision. Ice pack will help. Swelling and bruising can take several days to resolve..  6. It is common to experience some constipation if taking pain medication after surgery.  Increasing fluid intake and taking a stool softener will usually help or prevent this problem from occurring.  A mild laxative (Milk of Magnesia or Miralax) should be taken according to package directions if there are no bowel movements after 48 hours. 7.  You may have steri-strips (small skin tapes) in place directly over the incision.  These strips should be left on the skin for 7-10 days.  If your  surgeon used skin glue on the incision, you may shower in 24 hours.  The glue will flake off over the next 2-3 weeks.  Any sutures or staples will be removed at the office during your follow-up visit. You may find that a light gauze bandage over your incision may keep your staples from being rubbed or pulled. You may shower and replace the bandage daily. 8. ACTIVITIES:  You may resume regular (light) daily activities beginning the next day--such as daily self-care, walking, climbing stairs--gradually increasing activities as tolerated.  You may have sexual intercourse when it is comfortable.  Refrain from any heavy lifting or straining until approved by your doctor. a. You may drive when you no longer are taking prescription pain medication, you can comfortably wear a seatbelt, and you can safely maneuver your car and apply brakes b. Return to Work: ___________________________________ 9. You should see your doctor in the office for a follow-up appointment approximately two weeks after your surgery.  Make sure that you call for this appointment within a day or two after you arrive home to insure a convenient appointment time. OTHER INSTRUCTIONS:  _____________________________________________________________ _____________________________________________________________  WHEN TO CALL YOUR DOCTOR: 1. Fever over 101.0 2. Inability to urinate 3. Nausea and/or vomiting 4. Extreme swelling or bruising 5. Continued bleeding from incision. 6. Increased pain, redness, or drainage from the incision. 7. Difficulty swallowing or breathing 8. Muscle cramping or spasms. 9. Numbness or tingling in hands or feet or around lips.  The clinic staff is available to   answer your questions during regular business hours.  Please don't hesitate to call and ask to speak to one of the nurses if you have concerns.  For further questions, please visit [email or web address]   

## 2013-10-24 ENCOUNTER — Encounter: Payer: Self-pay | Admitting: Physician Assistant

## 2013-10-24 MED ORDER — LINACLOTIDE 290 MCG PO CAPS: 290.0000 ug | ORAL_CAPSULE | Freq: Every day | ORAL | Status: DC

## 2013-10-24 NOTE — Telephone Encounter (Signed)
Refill appropriate and filled per protocol. 

## 2013-10-27 ENCOUNTER — Encounter (INDEPENDENT_AMBULATORY_CARE_PROVIDER_SITE_OTHER): Payer: Self-pay

## 2013-10-27 ENCOUNTER — Ambulatory Visit (INDEPENDENT_AMBULATORY_CARE_PROVIDER_SITE_OTHER): Payer: Commercial Managed Care - PPO | Admitting: *Deleted

## 2013-10-27 VITALS — BP 122/70 | HR 72 | Temp 98.7°F | Resp 14 | Ht 70.0 in | Wt 220.0 lb

## 2013-10-27 DIAGNOSIS — Z5189 Encounter for other specified aftercare: Secondary | ICD-10-CM

## 2013-10-27 NOTE — Progress Notes (Signed)
Pt came in today for nurse only visit to remove staples, per Dr. Redmond Pulling.  Pt is s/p Splenic flexure colonic stricture with distal  transverse colon colitis 10-14-13.  Pt's wounds looked great!  No redness, no swelling, no signs of infection.  I removed staples from midline abdomen, and to the left and the right of the midline.  I cleaned wounds with saline and nu gauze.  The wound was completely closed and did not require steri strips.  Pt was advised to still be careful with what she lifted, if anything, to be sure not to bust the midline wound open.  Pt verbalized understanding.  Pt was asking for her p/o app with Dr. Redmond Pulling, that she has called a couple of times and still does not have one.  I advised pt that I would get with Jearld Fenton and Dr. Redmond Pulling and someone would definitely be getting back in touch with her.  Jeanann Lewandowsky, CMA

## 2013-11-01 ENCOUNTER — Encounter (INDEPENDENT_AMBULATORY_CARE_PROVIDER_SITE_OTHER): Payer: Self-pay | Admitting: General Surgery

## 2013-11-01 ENCOUNTER — Ambulatory Visit (INDEPENDENT_AMBULATORY_CARE_PROVIDER_SITE_OTHER): Payer: Commercial Managed Care - PPO | Admitting: General Surgery

## 2013-11-01 ENCOUNTER — Telehealth: Payer: Self-pay

## 2013-11-01 VITALS — BP 122/80 | HR 81 | Temp 97.0°F | Ht 70.0 in | Wt 223.0 lb

## 2013-11-01 DIAGNOSIS — Z09 Encounter for follow-up examination after completed treatment for conditions other than malignant neoplasm: Secondary | ICD-10-CM

## 2013-11-01 NOTE — Telephone Encounter (Signed)
Monique Gonzalez, Interesting case. Thanks for the note. We'll get her back in for colonoscopy in mid October Monique Gonzalez, Can you call her to set up colonoscopy in mid October [facility]) for recent colon stricture, colectomy. Ulice Dash, Thought you may be interested in her pathology from segmental colectomy earlier this month. Maybe chronic ischemic colitis? Maybe diverticular related? Certainly not classic for anything. I'm planning on completion colonoscopy in October, look in TI. DJ ----- Message ----- From: [name], MD Sent: 11/01/2013 2:58 PM To: Milus Banister, MD, Susy Frizzle, MD

## 2013-11-01 NOTE — Patient Instructions (Signed)
Can resume cardio Resume full lifting on/about 9/21 Taper off stool softner Can gradually resume high fiber diet Ok with you getting a colonoscopy about 8 weeks from your surgery date of 8/7

## 2013-11-01 NOTE — Progress Notes (Signed)
Subjective:     [name], female   DOB: 03-17-1955, 58 y.o.   MRN: [id number]  HPI 58 year old Caucasian female comes in for followup after being in the hospital from August 2 through the 13th. She was admitted with colitis and found to have a splenic flexure stricture. She underwent laparoscopic left colectomy with splenic flexure mobilization on August 7. She comes in for followup. She denies any fevers or chills. She denies any nausea or vomiting. She reports daily bowel movements. She is still taking her GI medication as well as a daily stool softener. She denies any melena or hematochezia. She states her energy level is slowly improving as well as her appetite. She is taking only one pain pill a day and it is at night  Review of Systems     Objective:   Physical Exam BP 122/80  Pulse 81  Temp(Src) 97 F (36.1 C)  Ht 5\' 10"  (1.778 m)  Wt 223 lb (101.152 kg)  BMI 32.00 kg/m2 Alert, no apparent distress Lungs clear to auscultation bilaterally Regular rate and rhythm Abdomen-soft, nondistended, really nontender. Maybe a little bit of tenderness at her mini midline incision. No cellulitis, induration or fluctuance. No cellulitis. No incisional hernia    Assessment:     Status post laparoscopic left colectomy with splenic flexure mobilization for colonic stricture.     Plan:     We discussed her pathology report and she was provided a copy of it. She was found to have a benign stricture. It is unclear exactly what caused the stricture. However it did occur in the watershed area. I advised her that her appetite and energy level we'll continue to slowly improve. I told her she could do cardiovascular activity but reminded her she should not do any heavy lifting, pushing, or pulling until 6 weeks from her date of surgery. I also gave her a note to stay out of work until 6 weeks after surgery. I advised her that she needs a completion colonoscopy but the earliest I would like  for her to have that would be 8 weeks from the date of surgery. I advised her she could start slowly adopting a high fiber diet. I also asked her to wean herself from a stool softener. Followup 7 weeks  Leighton Ruff. Redmond Pulling, MD, FACS General, Bariatric, & Minimally Invasive Surgery [facility], [place]

## 2013-11-02 NOTE — Telephone Encounter (Signed)
The pt has been scheduled for previsit and colon and she is aware

## 2013-11-02 NOTE — Telephone Encounter (Signed)
Left message on machine to call back  

## 2013-11-10 ENCOUNTER — Encounter: Payer: Self-pay | Admitting: Family Medicine

## 2013-11-10 ENCOUNTER — Encounter: Payer: Self-pay | Admitting: *Deleted

## 2013-11-12 ENCOUNTER — Encounter: Payer: Self-pay | Admitting: Physician Assistant

## 2013-11-15 ENCOUNTER — Encounter: Payer: Self-pay | Admitting: Physician Assistant

## 2013-11-15 ENCOUNTER — Encounter: Payer: Self-pay | Admitting: Family Medicine

## 2013-11-15 ENCOUNTER — Encounter: Payer: Self-pay | Admitting: Gastroenterology

## 2013-11-17 ENCOUNTER — Ambulatory Visit (INDEPENDENT_AMBULATORY_CARE_PROVIDER_SITE_OTHER): Payer: 59 | Admitting: Family Medicine

## 2013-11-17 ENCOUNTER — Encounter: Payer: Self-pay | Admitting: Family Medicine

## 2013-11-17 VITALS — BP 98/62 | HR 74 | Temp 97.6°F | Resp 18 | Ht 70.0 in | Wt 228.0 lb

## 2013-11-17 DIAGNOSIS — Z09 Encounter for follow-up examination after completed treatment for conditions other than malignant neoplasm: Secondary | ICD-10-CM

## 2013-11-17 DIAGNOSIS — Z23 Encounter for immunization: Secondary | ICD-10-CM

## 2013-11-17 LAB — CBC WITH DIFFERENTIAL/PLATELET
BASOS PCT: 1 % (ref 0–1)
Basophils Absolute: 0 10*3/uL (ref 0.0–0.1)
Eosinophils Absolute: 0 10*3/uL (ref 0.0–0.7)
Eosinophils Relative: 1 % (ref 0–5)
HCT: 35.7 % — ABNORMAL LOW (ref 36.0–46.0)
HEMOGLOBIN: 11.7 g/dL — AB (ref 12.0–15.0)
Lymphocytes Relative: 29 % (ref 12–46)
Lymphs Abs: 1.4 10*3/uL (ref 0.7–4.0)
MCH: 27.9 pg (ref 26.0–34.0)
MCHC: 32.8 g/dL (ref 30.0–36.0)
MCV: 85.2 fL (ref 78.0–100.0)
MONO ABS: 0.5 10*3/uL (ref 0.1–1.0)
MONOS PCT: 11 % (ref 3–12)
Neutro Abs: 2.8 10*3/uL (ref 1.7–7.7)
Neutrophils Relative %: 58 % (ref 43–77)
Platelets: 193 10*3/uL (ref 150–400)
RBC: 4.19 MIL/uL (ref 3.87–5.11)
RDW: 14.4 % (ref 11.5–15.5)
WBC: 4.8 10*3/uL (ref 4.0–10.5)

## 2013-11-17 LAB — COMPLETE METABOLIC PANEL WITH GFR
ALBUMIN: 4 g/dL (ref 3.5–5.2)
ALT: 10 U/L (ref 0–35)
AST: 13 U/L (ref 0–37)
Alkaline Phosphatase: 47 U/L (ref 39–117)
BILIRUBIN TOTAL: 0.3 mg/dL (ref 0.2–1.2)
BUN: 16 mg/dL (ref 6–23)
CO2: 28 meq/L (ref 19–32)
Calcium: 9.4 mg/dL (ref 8.4–10.5)
Chloride: 103 mEq/L (ref 96–112)
Creat: 0.88 mg/dL (ref 0.50–1.10)
GFR, EST AFRICAN AMERICAN: 83 mL/min
GFR, Est Non African American: 72 mL/min
Glucose, Bld: 88 mg/dL (ref 70–99)
POTASSIUM: 4.6 meq/L (ref 3.5–5.3)
SODIUM: 140 meq/L (ref 135–145)
TOTAL PROTEIN: 6.3 g/dL (ref 6.0–8.3)

## 2013-11-17 LAB — TSH: TSH: 1.701 u[IU]/mL (ref 0.350–4.500)

## 2013-11-17 NOTE — Progress Notes (Signed)
Subjective:    [name], female    DOB: May 24, 1955, 58 y.o.   MRN: [id number]  HPI Patient is a very pleasant 58 year old white female who was admitted to the hospital in early August with nausea vomiting and colitis.  She was initially treated as infectious colitis. However she failed outpatient therapy and was readmitted and was then found to have a stricture in the colon near the splenic flexure in a watershed area. Biopsies were negative for cancer. It was felt that the stricture was likely due from chronic irritation and also ischemia. She is scheduled to have a repeat colonoscopy soon.  At the present time, biopsies been negative for inflammatory bowel disease. I have copied relevant portions of the discharge summary included below for my reference: Admit date: 10/09/2013  Discharge date: 10/20/2013  Admitting Diagnosis:  Colonic Stricture  Colitis  Diarrhea  Chronic constipation  Discharge Diagnosis  Patient Active Problem List    Diagnosis  Date Noted   .  Colonic stricture  10/12/2013   .  Nausea vomiting and diarrhea  10/10/2013   .  Colitis  10/09/2013   .  Constipation  09/20/2013   .  Hypokalemia  09/17/2013   .  Syncope and collapse  11/30/2012   .  Recurrent cold sores    .  Right knee pain  11/23/2011   .  Osteoarthritis    .  Depression    .  Hyperlipidemia    Consultants  GI - Dr. Hilarie Fredrickson  Internal medicine - Dr. Karleen Hampshire and Dr. Arnoldo Morale  Imaging:  No results found.  Procedures  Dr. Redmond Pulling (10/14/13) - Laparoscopic assisted left colectomy and mobilization of the splenic flexure  Surgical Pathology:  Colon, segmental resection, distal transverse and splenic flexure  - ULCERATION WITH INFLAMMATION AND FIBROSIS.  - FOUR BENIGN LYMPH NODES.  - NO EVIDENCE OF MALIGNANCY.  Hospital Course:  58 y.o. white female with a history of HLD, and Osteoarthritis who presented to the [facility] the morning of 10/10/13 due to 2 days of N/V/D and abdominal pain. A CT scan of  the ABD revealed inflammation and thickening of the colon at the splenic flexure. Was diagnosed with colitis, but CT showed improvement from previous CT thus she was discharged home with antibiotics. She returned to the ED due to worsening of her symptoms. She was not able to hold down foods or liquids or able to take her colon prep for her colonoscopy which was scheduled for the AM with Dr. Ardis Hughs. She was placed on IV Cipro and Flagyl and referred for admission to [facility]. She's complained of severe abdominal pain mostly in RLQ for 3 year associated with nausea and vomiting. C/o constipation and use of a lot of laxatives in order to have a BM. Has a BM every 1-2 days. Denies melena and hematochzia. Reports one episode of non-bloody, non-bilious, no coffee ground emesis yesterday. Denies fever and chills. Denies lightheadedness, dizziness, chest pain, SOB or peripheral edema. Patient normally eats a fairly healthy diet as she has been working on weight loss over the past year (80lbs). Works as a Marine scientist in [employer], [employer] and is able to avoid strenuous activity post operatively. She does not have any risk factors for ischemia like AFIB.  Patient was able to tolerate colon prep yesterday and underwent colonoscopy today by Dr. Hilarie Fredrickson which revealed stenosis around the splenic flexure and patchy colitis distal to the stricture in the proximal descending colon. Multiple biopsies were also  taken during this procedure, C.diff was negative.  Patient was admitted and underwent procedure listed above. Tolerated procedure well and was transferred to the floor. She experienced a post-operative ileus. As pain and bowel function improved diet was advanced as tolerated. She had trouble with hypokalemia, but this soon resolvedOn POD #7, the patient was voiding well, tolerating diet, ambulating well, pain well controlled, vital signs stable, incisions c/d/i and felt stable for discharge home. Patient will follow up in our office  in 3 weeks and knows to call with questions or concerns.   Patient has been cleared to return to work September 21. At the present time she is emulating around the house without difficulty. She does report dizziness and lightheadedness upon standing. She also reports fatigue. Lab work in the hospital significant for anemia with a hemoglobin of 10.8. Her blood pressure is also low today at 98/62. She denies any melena or hematochezia. She is not currently on an iron supplement due to fears and concerns over constipation. She is taking a B12 supplement. She is also continuing to take Linzess 290 mcg daily to help prevent constipation along with Colace. Patient take the medication every morning around 7:00. About 11:00 in the afternoon she has blow out/ severe diarrhea.  She is doing well her pain is controlled. Past Medical History  Diagnosis Date  . Seasonal allergies   . Osteoarthritis   . Depression   . Hyperlipidemia   . Recurrent cold sores   . Syncope and collapse 11/30/2012    Normal EEG.  Vaso vagal syncope  . Colitis 09/2012    infectious vs inflammatory.    Past Surgical History  Procedure Laterality Date  . Tonsillectomy    . Colonoscopy N/A 10/12/2013    Procedure: COLONOSCOPY;  Surgeon: [name], MD;  Location: [facility] ENDOSCOPY;  Service: Endoscopy;  Laterality: N/A;  . Colon resection N/A 10/14/2013    Procedure: LAPAROSCOPIC MOBILIZATION OF SPLENIC FLEXURE, LAPAROSCOPIC EXTENDED LEFT COLECTOMY;  Surgeon: [name], MD;  Location: [place];  Service: General;  Laterality: N/A;   Current Outpatient Prescriptions on File Prior to Visit  Medication Sig Dispense Refill  . acyclovir cream (ZOVIRAX) 5 % Apply 1 application topically every 3 (three) hours.  15 g  3  . calcium-vitamin D (OSCAL WITH D) 500-200 MG-UNIT per tablet Take 1 tablet by mouth daily.       . Cholecalciferol (VITAMIN D) 1000 UNITS capsule Take 2,000 Units by mouth daily.       . citalopram (CELEXA) 20 MG tablet Take  1 tablet (20 mg total) by mouth daily.  90 tablet  1  . fish oil-omega-3 fatty acids 1000 MG capsule Take 2 g by mouth daily.      . fluticasone (FLONASE) 50 MCG/ACT nasal spray Place 2 sprays into both nostrils daily.  16 g  6  . Glucos-MSM-C-Mn-Ginger-Willow (GLUCOSAMINE MSM COMPLEX) TABS Take 1 tablet by mouth daily.       . Linaclotide (LINZESS) 290 MCG CAPS capsule Take 1 capsule (290 mcg total) by mouth daily.  30 capsule  3  . loratadine (CLARITIN) 10 MG tablet Take 10 mg by mouth daily.        Marland Kitchen Lysine 500 MG TABS Take 500 mg by mouth daily.       . meloxicam (MOBIC) 7.5 MG tablet Take 1 tablet (7.5 mg total) by mouth daily.  90 tablet  1  . Multiple Vitamins-Minerals (MULTIVITAMIN,TX-MINERALS) tablet Take 1 tablet by mouth daily.        Marland Kitchen  ondansetron (ZOFRAN) 8 MG tablet Take 1 tablet (8 mg total) by mouth 3 (three) times daily.  60 tablet  0  . oxyCODONE (OXY IR/ROXICODONE) 5 MG immediate release tablet Take 1 tablet (5 mg total) by mouth every 6 (six) hours as needed for moderate pain.  40 tablet  0  . valACYclovir (VALTREX) 1000 MG tablet Take 2,000 mg by mouth 2 (two) times daily as needed (for 2 doses for flare ups).      . vitamin E 400 UNIT capsule Take 400 Units by mouth daily.        Marland Kitchen MOVIPREP 100 G SOLR Take 1 kit (200 g total) by mouth once.  1 kit  0  . polyethylene glycol (MIRALAX / GLYCOLAX) packet Take 17 g by mouth daily.  14 each  0   No current facility-administered medications on file prior to visit.   Allergies  Allergen Reactions  . Penicillins Rash  . Sulfa Antibiotics Rash   History   Social History  . Marital Status: Divorced    Spouse Name: N/A    Number of Children: 2  . 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 Smoking status: Former Smoker    Quit date: 10/27/1993  . Smokeless tobacco: Never Used  . Alcohol Use: Yes     Comment: one drink per week  . Drug Use: No  . Sexual  Activity: Not on file   Other Topics Concern  . Not on file   Social History Narrative  . No narrative on file        Review of Systems  All other systems reviewed and are negative.      Objective:   Physical Exam  Vitals reviewed. Constitutional: She appears well-developed and well-nourished. No distress.  Eyes: Conjunctivae are normal.  Neck: Neck supple. No JVD present. No thyromegaly present.  Cardiovascular: Normal rate, regular rhythm, normal heart sounds and intact distal pulses.   No murmur heard. Pulmonary/Chest: Effort normal and breath sounds normal. No respiratory distress. She has no wheezes. She has no rales. She exhibits no tenderness.  Abdominal: Soft. Bowel sounds are normal. She exhibits no distension. There is no tenderness. There is no rebound and no guarding.  Musculoskeletal: She exhibits no edema.  Lymphadenopathy:    She has no cervical adenopathy.  Skin: She is not diaphoretic.   surgical scars on the abdomen appear well healed.        Assessment & Plan:  Hospital discharge follow-up - Plan: COMPLETE METABOLIC PANEL WITH GFR, CBC with Differential, TSH  Need for prophylactic vaccination and inoculation against influenza - Plan: Flu Vaccine QUAD 36+ mos IM  Prior to her most recent hospitalization, patient was taking multiple stool softeners due to fear of constipation triggering colitis. It is possible that her stricture as well as causing her constipation and her colitis. At the present time she appears to be taking too many stool softeners as she is having blow out diarrhea. Therefore I will cautiously begin to wean the patient back. Decrease Linzess to 145 mcg poqday.  If she continues to have severe diarrhea discontinue the medication altogether. At the present time I would continue Colace. Also check a CBC to monitor the patient's anemia. If she is still anemic or check an iron level. The patient may need to be on iron level. Reason low blood  pressure and her anemia her causing  her orthostatic dizziness. Patient also hypokalemia in the hospital due to diarrhea. I will recheck that today as she continues to have diarrhea.  Patient also received a flu shot today in clinic

## 2013-11-18 ENCOUNTER — Encounter: Payer: Self-pay | Admitting: Family Medicine

## 2013-11-18 ENCOUNTER — Encounter (INDEPENDENT_AMBULATORY_CARE_PROVIDER_SITE_OTHER): Payer: Self-pay | Admitting: General Surgery

## 2013-11-21 LAB — HM MAMMOGRAPHY: HM Mammogram: NORMAL

## 2013-11-22 ENCOUNTER — Encounter: Payer: Self-pay | Admitting: Gastroenterology

## 2013-11-23 ENCOUNTER — Encounter: Payer: Self-pay | Admitting: Family Medicine

## 2013-12-15 ENCOUNTER — Ambulatory Visit: Payer: 59 | Admitting: Physician Assistant

## 2013-12-21 ENCOUNTER — Encounter (INDEPENDENT_AMBULATORY_CARE_PROVIDER_SITE_OTHER): Payer: Commercial Managed Care - PPO | Admitting: General Surgery

## 2013-12-23 ENCOUNTER — Ambulatory Visit (AMBULATORY_SURGERY_CENTER): Payer: Self-pay | Admitting: *Deleted

## 2013-12-23 VITALS — Ht 70.0 in | Wt 231.6 lb

## 2013-12-23 DIAGNOSIS — K56699 Other intestinal obstruction unspecified as to partial versus complete obstruction: Secondary | ICD-10-CM

## 2013-12-23 DIAGNOSIS — K5669 Other intestinal obstruction: Secondary | ICD-10-CM

## 2013-12-23 MED ORDER — MOVIPREP 100 G PO SOLR: 1.0000 | Freq: Once | ORAL | Status: DC

## 2013-12-23 NOTE — Progress Notes (Signed)
Pt states she is still on Linzess, stools are regular to diarrhea since surgery in August. ewm No egg or soy allergy. ewm No home 02 use. ewm No diet pills. ewm No problems with past sedation. ewm Pt declined emmi video. ewm

## 2013-12-29 ENCOUNTER — Ambulatory Visit: Payer: 59 | Admitting: Family Medicine

## 2013-12-30 ENCOUNTER — Encounter: Payer: Self-pay | Admitting: Gastroenterology

## 2013-12-30 ENCOUNTER — Ambulatory Visit (AMBULATORY_SURGERY_CENTER): Payer: 59 | Admitting: Gastroenterology

## 2013-12-30 VITALS — BP 139/69 | HR 54 | Temp 96.4°F | Resp 17 | Ht 70.0 in | Wt 231.0 lb

## 2013-12-30 DIAGNOSIS — K5669 Other intestinal obstruction: Secondary | ICD-10-CM

## 2013-12-30 DIAGNOSIS — K5660 Unspecified intestinal obstruction: Secondary | ICD-10-CM

## 2013-12-30 DIAGNOSIS — K529 Noninfective gastroenteritis and colitis, unspecified: Secondary | ICD-10-CM

## 2013-12-30 DIAGNOSIS — R198 Other specified symptoms and signs involving the digestive system and abdomen: Secondary | ICD-10-CM

## 2013-12-30 DIAGNOSIS — K56699 Other intestinal obstruction unspecified as to partial versus complete obstruction: Secondary | ICD-10-CM

## 2013-12-30 MED ORDER — SODIUM CHLORIDE 0.9 % IV SOLN: 500.0000 mL | INTRAVENOUS | Status: DC

## 2013-12-30 NOTE — Op Note (Signed)
[place]  [address] [place], [address]   COLONOSCOPY PROCEDURE REPORT  PATIENT: Monique Gonzalez, Monique Gonzalez  MR#: [id number] BIRTHDATE: 02-15-1956 , 58  yrs. old GENDER: female ENDOSCOPIST: Milus Banister, MD PROCEDURE DATE:  12/30/2013 PROCEDURE:   Colonoscopy with biopsy First Screening Colonoscopy - Avg.  risk and is 50 yrs.  old or older - No.  Prior Negative Screening - Now for repeat screening. N/A  History of Adenoma - Now for follow-up colonoscopy & has been > or = to 3 yrs.  N/A  Polyps Removed Today? No.  Recommend repeat exam, <10 yrs? No. ASA CLASS:   Class II INDICATIONS:10/2013 segmental colectomy for splenic stricture, likely from chronic ischemia (Dr.  Redmond Pulling). MEDICATIONS: Monitored anesthesia care and Propofol 200 mg IV  DESCRIPTION OF PROCEDURE:   After the risks benefits and alternatives of the procedure were thoroughly explained, informed consent was obtained.  The digital rectal exam revealed no abnormalities of the rectum.   The LB [id number]  endoscope was introduced through the anus and advanced to the terminal ileum which was intubated for a short distance. No adverse events experienced.   The quality of the prep was excellent.  The instrument was then slowly withdrawn as the colon was fully examined.  COLON FINDINGS: The terminal ileum was normal.  The left sided anastomosis was mildly inflamed.  The colon proximal to the anastomosis was fairly tortuous and there was one focal region that appeared to have chronic scarring.  The right colon was randomly biopsied.  The left colon was randomly biopsied.  The examination was otherwise normal.  Retroflexed views revealed no abnormalities. The time to cecum=3 minutes 28 seconds.  Withdrawal time=8 minutes 28 seconds.  The scope was withdrawn and the procedure completed. COMPLICATIONS: There were no immediate complications.  ENDOSCOPIC IMPRESSION: The terminal ileum was normal.  The left  sided anastomosis was mildly inflamed.  The colon proximal to the anastomosis was fairly tortuous and there was one focal region that appeared to have chronic scarring.  The right colon was randomly biopsied.  The left colon was randomly biopsied.  The examination was otherwise normal  RECOMMENDATIONS: 1.  You should continue to follow colorectal cancer screening guidelines for "routine risk" patients with a repeat colonoscopy in 10 years. 2.  Await biopsy results for further recommendations.  eSigned:  Milus Banister, MD 12/30/2013 8:23 AM   cc: Jenna Luo, MD; Greer Pickerel, MD

## 2013-12-30 NOTE — Patient Instructions (Signed)
YOU HAD AN ENDOSCOPIC PROCEDURE TODAY AT THE [facility]: Refer to the procedure report that was given to you for any specific questions about what was found during the examination.  If the procedure report does not answer your questions, please call your gastroenterologist to clarify.  If you requested that your care partner not be given the details of your procedure findings, then the procedure report has been included in a sealed envelope for you to review at your convenience later.  YOU SHOULD EXPECT: Some feelings of bloating in the abdomen. Passage of more gas than usual.  Walking can help get rid of the air that was put into your GI tract during the procedure and reduce the bloating. If you had a lower endoscopy (such as a colonoscopy or flexible sigmoidoscopy) you may notice spotting of blood in your stool or on the toilet paper. If you underwent a bowel prep for your procedure, then you may not have a normal bowel movement for a few days.  DIET: Your first meal following the procedure should be a light meal and then it is ok to progress to your normal diet.  A half-sandwich or bowl of soup is an example of a good first meal.  Heavy or fried foods are harder to digest and may make you feel nauseous or bloated.  Likewise meals heavy in dairy and vegetables can cause extra gas to form and this can also increase the bloating.  Drink plenty of fluids but you should avoid alcoholic beverages for 24 hours.  ACTIVITY: Your care partner should take you home directly after the procedure.  You should plan to take it easy, moving slowly for the rest of the day.  You can resume normal activity the day after the procedure however you should NOT DRIVE or use heavy machinery for 24 hours (because of the sedation medicines used during the test).    SYMPTOMS TO REPORT IMMEDIATELY: A gastroenterologist can be reached at any hour.  During normal business hours, 8:30 AM to 5:00 PM Monday through Friday,  call [phone].  After hours and on weekends, please call the GI answering service at [phone] who will take a message and have the physician on call contact you.   Following lower endoscopy (colonoscopy or flexible sigmoidoscopy):  Excessive amounts of blood in the stool  Significant tenderness or worsening of abdominal pains  Swelling of the abdomen that is new, acute  Fever of 100F or higher  FOLLOW UP: If any biopsies were taken you will be contacted by phone or by letter within the next 1-3 weeks.  Call your gastroenterologist if you have not heard about the biopsies in 3 weeks.  Our staff will call the home number listed on your records the next business day following your procedure to check on you and address any questions or concerns that you may have at that time regarding the information given to you following your procedure. This is a courtesy call and so if there is no answer at the home number and we have not heard from you through the emergency physician on call, we will assume that you have returned to your regular daily activities without incident.  SIGNATURES/CONFIDENTIALITY: You and/or your care partner have signed paperwork which will be entered into your electronic medical record.  These signatures attest to the fact that that the information above on your After Visit Summary has been reviewed and is understood.  Full responsibility of the confidentiality of this   discharge information lies with you and/or your care-partner.  Repeat colonoscopy in 10 years 2025.  Wait biopsy results.

## 2013-12-30 NOTE — Progress Notes (Signed)
A/ox3 pleased with MAC, report to April RN 

## 2013-12-30 NOTE — Progress Notes (Signed)
Called to room to assist during endoscopic procedure.  Patient ID and intended procedure confirmed with present staff. Received instructions for my participation in the procedure from the performing physician.  

## 2014-01-02 ENCOUNTER — Telehealth: Payer: Self-pay | Admitting: *Deleted

## 2014-01-02 NOTE — Telephone Encounter (Signed)
  Follow up Call-  Call back number 12/30/2013  Post procedure Call Back phone  # cell [phone]  Permission to leave phone message Yes     Patient questions:  Do you have a fever, pain , or abdominal swelling? No   Pain Score  0 *  Have you tolerated food without any problems? Yes.    Have you been able to return to your normal activities? Yes.    Do you have any questions about your discharge instructions: Diet   No. Medications  No. Follow up visit  No.  Do you have questions or concerns about your Care? No.  Actions: * If pain score is 4 or above: No action needed, pain <4.

## 2014-01-17 ENCOUNTER — Encounter: Payer: Self-pay | Admitting: Gastroenterology

## 2014-01-18 ENCOUNTER — Telehealth: Payer: Self-pay | Admitting: Neurology

## 2014-01-18 DIAGNOSIS — D352 Benign neoplasm of pituitary gland: Secondary | ICD-10-CM

## 2014-01-18 NOTE — Telephone Encounter (Signed)
I called the patient. If she is amenable to having a repeat MRI scan of the pituitary gland, she is to contact our office. The prior study showed a possible microadenoma. This was done one year ago.

## 2014-01-24 NOTE — Telephone Encounter (Signed)
I called the patient. The MRI of the pituitary gland will be done, I will also check a prolactin level, as this was mildly elevated before.

## 2014-01-24 NOTE — Telephone Encounter (Signed)
Patient returning call to state that she would like that repeat MRI scan done, and wants to know if she needs to have repeat labwork as well. Please return call and advise.

## 2014-01-24 NOTE — Addendum Note (Signed)
Addended by: Margette Fast on: 01/24/2014 09:59 AM   Modules accepted: Orders

## 2014-02-22 ENCOUNTER — Telehealth: Payer: Self-pay | Admitting: Family Medicine

## 2014-02-22 MED ORDER — MELOXICAM 15 MG PO TABS: 15.0000 mg | ORAL_TABLET | Freq: Every day | ORAL | Status: DC

## 2014-02-22 NOTE — Telephone Encounter (Signed)
Pt requesting refill of Meloxicam.  Would like it increased to 15 mg daily.  The 15 mg has been giving her better relief.  Send 90 day supply

## 2014-03-08 ENCOUNTER — Other Ambulatory Visit: Payer: 59

## 2014-03-15 ENCOUNTER — Encounter: Payer: Self-pay | Admitting: Physician Assistant

## 2014-03-16 MED ORDER — CITALOPRAM HYDROBROMIDE 20 MG PO TABS: 20.0000 mg | ORAL_TABLET | Freq: Every day | ORAL | Status: DC

## 2014-05-23 ENCOUNTER — Ambulatory Visit (INDEPENDENT_AMBULATORY_CARE_PROVIDER_SITE_OTHER): Payer: 59 | Admitting: Family Medicine

## 2014-05-23 ENCOUNTER — Encounter: Payer: Self-pay | Admitting: Family Medicine

## 2014-05-23 VITALS — BP 132/74 | Temp 98.2°F

## 2014-05-23 DIAGNOSIS — M25561 Pain in right knee: Secondary | ICD-10-CM

## 2014-05-23 NOTE — Progress Notes (Signed)
Subjective:    [name], female    DOB: 07/25/1955, 59 y.o.   MRN: [id number]  HPI Patient has a history of osteoarthritis of the right knee. I have injected her right knee in the remote past. Over the last several weeks she's had increasing pain in her right knee. The pain is located primarily over the medial joint line. She also reports episodic confusions and swelling behind the knee. She denies any erythema. She denies any locking of the knee joint. However she does report that the knee sometimes feels like is going to give out on her. Past Medical History  Diagnosis Date  . Seasonal allergies   . Osteoarthritis   . Depression   . Hyperlipidemia   . Recurrent cold sores   . Syncope and collapse 11/30/2012    Normal EEG.  Vaso vagal syncope  . Colitis 09/2012    infectious vs inflammatory.   . Allergy   . Colon stricture   . Osteopenia    Past Surgical History  Procedure Laterality Date  . Tonsillectomy    . Colonoscopy N/A 10/12/2013    Procedure: COLONOSCOPY;  Surgeon: [name], MD;  Location: [facility] ENDOSCOPY;  Service: Endoscopy;  Laterality: N/A;  . Colon resection N/A 10/14/2013    Procedure: LAPAROSCOPIC MOBILIZATION OF SPLENIC FLEXURE, LAPAROSCOPIC EXTENDED LEFT COLECTOMY;  Surgeon: [name], MD;  Location: [place];  Service: General;  Laterality: N/A;   Current Outpatient Prescriptions on File Prior to Visit  Medication Sig Dispense Refill  . acyclovir cream (ZOVIRAX) 5 % Apply 1 application topically every 3 (three) hours. 15 g 3  . calcium-vitamin D (OSCAL WITH D) 500-200 MG-UNIT per tablet Take 1 tablet by mouth daily.     . Cholecalciferol (VITAMIN D) 1000 UNITS capsule Take 2,000 Units by mouth daily.     . citalopram (CELEXA) 20 MG tablet Take 1 tablet (20 mg total) by mouth daily. 90 tablet 3  . Cyanocobalamin (VITAMIN B 12 PO) Take 1,000 mcg by mouth daily.    . fish oil-omega-3 fatty acids 1000 MG capsule Take 2 g by mouth daily.    .  fluticasone (FLONASE) 50 MCG/ACT nasal spray Place 2 sprays into both nostrils daily. 16 g 6  . Glucos-MSM-C-Mn-Ginger-Willow (GLUCOSAMINE MSM COMPLEX) TABS Take 1 tablet by mouth daily.     Marland Kitchen loratadine (CLARITIN) 10 MG tablet Take 10 mg by mouth daily.      Marland Kitchen Lysine 500 MG TABS Take 500 mg by mouth daily.     . meloxicam (MOBIC) 15 MG tablet Take 1 tablet (15 mg total) by mouth daily. 90 tablet 3  . Multiple Vitamins-Minerals (MULTIVITAMIN,TX-MINERALS) tablet Take 1 tablet by mouth daily.      . ondansetron (ZOFRAN) 8 MG tablet Take 1 tablet (8 mg total) by mouth 3 (three) times daily. 60 tablet 0  . valACYclovir (VALTREX) 1000 MG tablet Take 2,000 mg by mouth 2 (two) times daily as needed (for 2 doses for flare ups).    . vitamin E 400 UNIT capsule Take 400 Units by mouth daily.       No current facility-administered medications on file prior to visit.   Allergies  Allergen Reactions  . Penicillins Rash  . Sulfa Antibiotics Rash   History   Social History  . Marital Status: Divorced    Spouse Name: N/A  . Number of Children: 2  . 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 Smoking status: Former Smoker    Quit date: 10/27/1993  . Smokeless tobacco: Never Used  . Alcohol Use: Yes     Comment: one drink per week  . Drug Use: No  . Sexual Activity: Not on file   Other Topics Concern  . Not on file   Social History Narrative      Review of Systems  All other systems reviewed and are negative.      Objective:   Physical Exam  Cardiovascular: Normal rate and regular rhythm.   Pulmonary/Chest: Effort normal and breath sounds normal.  Musculoskeletal:       Right knee: She exhibits decreased range of motion and effusion. She exhibits no erythema, no LCL laxity, normal patellar mobility, normal meniscus and no MCL laxity. Tenderness found. Medial joint line and lateral joint line tenderness noted. No MCL  and no LCL tenderness noted.  Vitals reviewed.         Assessment & Plan:  Right knee pain  I suspect the patient is having right knee pain due to osteoarthritis in the right knee. After obtaining informed consent, I injected the right knee using sterile technique with a mixture of 2 mL of 0.1% lidocaine, 2 mL of Marcaine, and 2 mL of 40 mg per mL Kenalog. The patient tolerated the procedure well without complication.

## 2014-06-12 ENCOUNTER — Other Ambulatory Visit: Payer: Self-pay | Admitting: Physician Assistant

## 2014-08-07 ENCOUNTER — Emergency Department (HOSPITAL_BASED_OUTPATIENT_CLINIC_OR_DEPARTMENT_OTHER): Payer: 59

## 2014-08-07 ENCOUNTER — Encounter (HOSPITAL_BASED_OUTPATIENT_CLINIC_OR_DEPARTMENT_OTHER): Payer: Self-pay

## 2014-08-07 ENCOUNTER — Emergency Department (HOSPITAL_BASED_OUTPATIENT_CLINIC_OR_DEPARTMENT_OTHER)
Admission: EM | Admit: 2014-08-07 | Discharge: 2014-08-07 | Disposition: A | Discharge status: Home / Self Care | Payer: 59 | Attending: Emergency Medicine | Admitting: Emergency Medicine

## 2014-08-07 DIAGNOSIS — Z87891 Personal history of nicotine dependence: Secondary | ICD-10-CM | POA: Insufficient documentation

## 2014-08-07 DIAGNOSIS — Z8619 Personal history of other infectious and parasitic diseases: Secondary | ICD-10-CM | POA: Insufficient documentation

## 2014-08-07 DIAGNOSIS — Y998 Other external cause status: Secondary | ICD-10-CM | POA: Insufficient documentation

## 2014-08-07 DIAGNOSIS — W010XXA Fall on same level from slipping, tripping and stumbling without subsequent striking against object, initial encounter: Secondary | ICD-10-CM | POA: Insufficient documentation

## 2014-08-07 DIAGNOSIS — F329 Major depressive disorder, single episode, unspecified: Secondary | ICD-10-CM | POA: Diagnosis not present

## 2014-08-07 DIAGNOSIS — Z791 Long term (current) use of non-steroidal anti-inflammatories (NSAID): Secondary | ICD-10-CM | POA: Insufficient documentation

## 2014-08-07 DIAGNOSIS — Z880 Allergy status to penicillin: Secondary | ICD-10-CM | POA: Diagnosis not present

## 2014-08-07 DIAGNOSIS — S62101A Fracture of unspecified carpal bone, right wrist, initial encounter for closed fracture: Secondary | ICD-10-CM

## 2014-08-07 DIAGNOSIS — M1990 Unspecified osteoarthritis, unspecified site: Secondary | ICD-10-CM | POA: Diagnosis not present

## 2014-08-07 DIAGNOSIS — S52614A Nondisplaced fracture of right ulna styloid process, initial encounter for closed fracture: Secondary | ICD-10-CM | POA: Diagnosis not present

## 2014-08-07 DIAGNOSIS — Y9289 Other specified places as the place of occurrence of the external cause: Secondary | ICD-10-CM | POA: Insufficient documentation

## 2014-08-07 DIAGNOSIS — Y9389 Activity, other specified: Secondary | ICD-10-CM | POA: Diagnosis not present

## 2014-08-07 DIAGNOSIS — Z79899 Other long term (current) drug therapy: Secondary | ICD-10-CM | POA: Insufficient documentation

## 2014-08-07 DIAGNOSIS — S6991XA Unspecified injury of right wrist, hand and finger(s), initial encounter: Secondary | ICD-10-CM | POA: Diagnosis present

## 2014-08-07 DIAGNOSIS — M8580 Other specified disorders of bone density and structure, unspecified site: Secondary | ICD-10-CM | POA: Insufficient documentation

## 2014-08-07 DIAGNOSIS — Z8719 Personal history of other diseases of the digestive system: Secondary | ICD-10-CM | POA: Insufficient documentation

## 2014-08-07 DIAGNOSIS — E785 Hyperlipidemia, unspecified: Secondary | ICD-10-CM | POA: Insufficient documentation

## 2014-08-07 MED ORDER — OXYCODONE-ACETAMINOPHEN 5-325 MG PO TABS
1.0000 | ORAL_TABLET | Freq: Once | ORAL | Status: AC
  Administered 2014-08-07: 1 via ORAL
  Filled 2014-08-07: qty 1

## 2014-08-07 MED ORDER — OXYCODONE-ACETAMINOPHEN 5-325 MG PO TABS: 1.0000 | ORAL_TABLET | ORAL | Status: DC | PRN

## 2014-08-07 NOTE — Consult Note (Signed)
  Patient is at [facility]. Patient has a displaced interarticular distal radius fracture grade III part. I discussed with the patient over the phone the need for elective surgery given her age and activity level and other issues surrounding her care. She'll be placed in a splint today in follow-up in my office tomorrow. I will preliminarily get things started for surgical intervention tomorrow afternoon pending schedule availability.  We'll see her in the office at [email or web address].

## 2014-08-07 NOTE — Discharge Instructions (Signed)
Keep wrist elevated. Ice several times a day. Follow up with Dr. Amedeo Plenty tomorrow at [facility] in the office. Nothing by mouth after midnight.   Wrist Fracture A wrist fracture is a break or crack in one of the bones of your wrist. Your wrist is made up of eight small bones at the palm of your hand (carpal bones) and two long bones that make up your forearm (radius and ulna).  CAUSES   A direct blow to the wrist.  Falling on an outstretched hand.  Trauma, such as a car accident or a fall. RISK FACTORS Risk factors for wrist fracture include:   Participating in contact and high-risk sports, such as skiing, biking, and ice skating.  Taking steroid medicines.  Smoking.  Being female.  Being Caucasian.  Drinking more than three alcoholic beverages per day.  Having low or lowered bone density (osteoporosis or osteopenia).  Age. Older adults have decreased bone density.  Women who have had menopause.  History of previous fractures. SIGNS AND SYMPTOMS Symptoms of wrist fractures include tenderness, bruising, and inflammation. Additionally, the wrist may hang in an odd position or appear deformed.  DIAGNOSIS Diagnosis may include:  Physical exam.  X-ray. TREATMENT Treatment depends on many factors, including the nature and location of the fracture, your age, and your activity level. Treatment for wrist fracture can be nonsurgical or surgical.  Nonsurgical Treatment A plaster cast or splint may be applied to your wrist if the bone is in a good position. If the fracture is not in good position, it may be necessary for your health care provider to realign it before applying a splint or cast. Usually, a cast or splint will be worn for several weeks.  Surgical Treatment Sometimes the position of the bone is so far out of place that surgery is required to apply a device to hold it together as it heals. Depending on the fracture, there are a number of options for holding the bone in place  while it heals, such as a cast and metal pins.  HOME CARE INSTRUCTIONS  Keep your injured wrist elevated and move your fingers as much as possible.  Do not put pressure on any part of your cast or splint. It may break.   Use a plastic bag to protect your cast or splint from water while bathing or showering. Do not lower your cast or splint into water.  Take medicines only as directed by your health care provider.  Keep your cast or splint clean and dry. If it becomes wet, damaged, or suddenly feels too tight, contact your health care provider right away.  Do not use any tobacco products including cigarettes, chewing tobacco, or electronic cigarettes. Tobacco can delay bone healing. If you need help quitting, ask your health care provider.  Keep all follow-up visits as directed by your health care provider. This is important.  Ask your health care provider if you should take supplements of calcium and vitamins C and D to promote bone healing. SEEK MEDICAL CARE IF:   Your cast or splint is damaged, breaks, or gets wet.  You have a fever.  You have chills.  You have continued severe pain or more swelling than you did before the cast was put on. SEEK IMMEDIATE MEDICAL CARE IF:   Your hand or fingernails on the injured arm turn blue or gray, or feel cold or numb.  You have decreased feeling in the fingers of your injured arm. MAKE SURE YOU:  Understand these  instructions.  Will watch your condition.  Will get help right away if you are not doing well or get worse. Document Released: 12/04/2004 Document Revised: 07/11/2013 Document Reviewed: 03/14/2011 [facility] Patient Information 2015 [place], [place]. This information is not intended to replace advice given to you by your health care provider. Make sure you discuss any questions you have with your health care provider.

## 2014-08-07 NOTE — ED Notes (Signed)
Fell approx 30 min PTA-right wrist injury-cardboard sling applied in triage

## 2014-08-07 NOTE — ED Provider Notes (Signed)
CSN: [id number]     Arrival date & time 08/07/14  1450 History   First MD Initiated Contact with Patient 08/07/14 1512     Chief Complaint  Patient presents with  . Wrist Injury     (Consider location/radiation/quality/duration/timing/severity/associated sxs/prior Treatment) HPI Monique Gonzalez is a 59 y.o. female with no major medical problems, presents to emergency department complaining of right wrist injury. Patient states she was pulling some weeds, when I read gave out and she fell backwards trying to get herself with the right arm. Patient reports pain, swelling, deformity and right wrist. She splinted her wrist with a cardboard splint. She denies any numbness or weakness in her hand. She states fingers feel tingly however. No other injuries. Did not hit her head. No loss of consciousness. No other complaints. No history of wrist fracture in the past.  Past Medical History  Diagnosis Date  . Seasonal allergies   . Osteoarthritis   . Depression   . Hyperlipidemia   . Recurrent cold sores   . Syncope and collapse 11/30/2012    Normal EEG.  Vaso vagal syncope  . Colitis 09/2012    infectious vs inflammatory.   . Allergy   . Colon stricture   . Osteopenia    Past Surgical History  Procedure Laterality Date  . Tonsillectomy    . Colonoscopy N/A 10/12/2013    Procedure: COLONOSCOPY;  Surgeon: [name], MD;  Location: [facility] ENDOSCOPY;  Service: Endoscopy;  Laterality: N/A;  . Colon resection N/A 10/14/2013    Procedure: LAPAROSCOPIC MOBILIZATION OF SPLENIC FLEXURE, LAPAROSCOPIC EXTENDED LEFT COLECTOMY;  Surgeon: [name], MD;  Location: [facility];  Service: General;  Laterality: N/A;   Family History  Problem Relation Age of Onset  . Sudden death Neg Hx   . Hypertension Neg Hx   . Hyperlipidemia Neg Hx   . Heart attack Neg Hx   . Diabetes Neg Hx   . Rectal cancer Neg Hx   . Stomach cancer Neg Hx   . Esophageal cancer Neg Hx   . Stroke Mother   . Arthritis Mother   . Cancer  Father   . Arthritis Sister   . Cancer Maternal Grandmother   . Stroke Maternal Grandfather   . Cancer Maternal Grandfather   . Colon cancer Maternal Grandfather   . Cancer Paternal Grandmother   . Heart disease Paternal Grandmother   . Colon cancer Paternal Grandmother   . Stroke Paternal Grandfather   . Arthritis Sister    History  Substance Use Topics  . Smoking status: Former Smoker    Quit date: 10/27/1993  . Smokeless tobacco: Never Used  . Alcohol Use: Yes     Comment: one drink per week   OB History    No data available     Review of Systems  Constitutional: Negative for fever and chills.  Respiratory: Negative for cough, chest tightness and shortness of breath.   Cardiovascular: Negative for chest pain, palpitations and leg swelling.  Gastrointestinal: Negative for nausea, vomiting, abdominal pain and diarrhea.  Musculoskeletal: Positive for joint swelling and arthralgias. Negative for myalgias, neck pain and neck stiffness.  Skin: Negative for rash.  Neurological: Negative for dizziness, weakness, numbness and headaches.  All other systems reviewed and are negative.     Allergies  Penicillins and Sulfa antibiotics  Home Medications   Prior to Admission medications   Medication Sig Start Date End Date Taking? Authorizing Provider  acyclovir cream (ZOVIRAX) 5 %  Apply 1 application topically every 3 (three) hours. 11/22/12   Lonie Peak Dixon, PA-C  calcium-vitamin D (OSCAL WITH D) 500-200 MG-UNIT per tablet Take 1 tablet by mouth daily.     Historical Provider, MD  Cholecalciferol (VITAMIN D) 1000 UNITS capsule Take 2,000 Units by mouth daily.     Historical Provider, MD  citalopram (CELEXA) 20 MG tablet Take 1 tablet (20 mg total) by mouth daily. 03/16/14   Susy Frizzle, MD  Cyanocobalamin (VITAMIN B 12 PO) Take 1,000 mcg by mouth daily.    Historical Provider, MD  fish oil-omega-3 fatty acids 1000 MG capsule Take 2 g by mouth daily.    Historical Provider, MD   fluticasone (FLONASE) 50 MCG/ACT nasal spray Place 2 sprays into both nostrils daily. 05/02/13   Lonie Peak Dixon, PA-C  Glucos-MSM-C-Mn-Ginger-Willow (GLUCOSAMINE MSM COMPLEX) TABS Take 1 tablet by mouth daily.     Historical Provider, MD  loratadine (CLARITIN) 10 MG tablet Take 10 mg by mouth daily.      Historical Provider, MD  Lysine 500 MG TABS Take 500 mg by mouth daily.     Historical Provider, MD  meloxicam (MOBIC) 15 MG tablet Take 1 tablet (15 mg total) by mouth daily. 02/22/14   Lonie Peak Dixon, PA-C  Multiple Vitamins-Minerals (MULTIVITAMIN,TX-MINERALS) tablet Take 1 tablet by mouth daily.      Historical Provider, MD  ondansetron (ZOFRAN) 8 MG tablet Take 1 tablet (8 mg total) by mouth 3 (three) times daily. 09/21/13   Orlena Sheldon, PA-C  oxyCODONE-acetaminophen (PERCOCET) 5-325 MG per tablet Take 1 tablet by mouth every 4 (four) hours as needed for severe pain. 08/07/14   Landra Howze, PA-C  valACYclovir (VALTREX) 1000 MG tablet Take 2,000 mg by mouth 2 (two) times daily as needed (for 2 doses for flare ups).    Historical Provider, MD  valACYclovir (VALTREX) 1000 MG tablet TAKE 2 TABLETS BY MOUTH EVERY 12 HOURS FOR 2 DOSES AS NEEDED FOR FLARE 06/12/14   Orlena Sheldon, PA-C  vitamin E 400 UNIT capsule Take 400 Units by mouth daily.      Historical Provider, MD   BP 123/50 mmHg  Pulse 60  Temp(Src) 97.8 F (36.6 C) (Oral)  Resp 18  Ht 5\' 10"  (1.778 m)  Wt 240 lb (108.863 kg)  BMI 34.44 kg/m2  SpO2 100% Physical Exam  Constitutional: She appears well-developed and well-nourished. No distress.  HENT:  Head: Normocephalic.  Eyes: Conjunctivae are normal.  Neck: Neck supple.  Cardiovascular: Normal rate, regular rhythm and normal heart sounds.   Pulmonary/Chest: Effort normal and breath sounds normal. No respiratory distress. She has no wheezes. She has no rales.  Musculoskeletal: She exhibits no edema.  Swelling noted to the right wrist. Tender to palpation diffusely over the  wrist. Unable to move due to pain. Distal radial pulse intact. Patient is able to make a thumbs up. Able to move all fingers. Fingers are pink and warm. Cap refill is less than 2 seconds distally and age finger tip. Sensation intact in all dermatomes of the hand. Normal elbow and shoulder.  Neurological: She is alert.  Skin: Skin is warm and dry.  Psychiatric: She has a normal mood and affect. Her behavior is normal.  Nursing note and vitals reviewed.   ED Course  Procedures (including critical care time) Labs Review Labs Reviewed - No data to display  Imaging Review Dg Wrist Complete Right  08/07/2014   CLINICAL DATA:  Fall, landing onto right  wrist, pain/swelling  EXAM: RIGHT WRIST - COMPLETE 3+ VIEW  COMPARISON:  None.  FINDINGS: Comminuted, intra-articular distal radial fracture.  Suspected nondisplaced ulnar styloid fracture.  Associated soft tissue swelling.  IMPRESSION: Comminuted, intra-articular distal radial fracture.  Nondisplaced ulnar styloid fracture.   Electronically Signed   By: [name]   On: 08/07/2014 15:28     EKG Interpretation None      MDM   Final diagnoses:  Wrist fracture, right, closed, initial encounter    Pt with right wrist fracture. Xray already resulted prior to me seeing her. She is neurovascularly intact. No other injuries. I called and spoke with Dr. Amedeo Plenty, hand surgeon, who also spoke with patient, plan to splint, discharged home, follow up with him in the office tomorrow morning at 8 AM. Patient was given Percocet for pain and emergency department, will discharge home with Percocet, ice, elevation. Patient will follow-up tomorrow morning for possible surgical repair tomorrow.  Filed Vitals:   08/07/14 1457 08/07/14 1621  BP: 109/40 123/50  Pulse: 67 60  Temp: 97.8 F (36.6 C) 97.8 F (36.6 C)  TempSrc: Oral Oral  Resp: 18 18  Height: 5\' 10"  (1.778 m)   Weight: 240 lb (108.863 kg)   SpO2: 100% 100%       Jeannett Senior, PA-C 08/07/14 1637  Pattricia Boss, MD 08/08/14 1249

## 2014-08-08 ENCOUNTER — Observation Stay (HOSPITAL_COMMUNITY)
Admission: AD | Admit: 2014-08-08 | Discharge: 2014-08-10 | Disposition: A | Discharge status: Home / Self Care | Payer: 59 | Source: Ambulatory Visit | Attending: Orthopedic Surgery | Admitting: Orthopedic Surgery

## 2014-08-08 ENCOUNTER — Ambulatory Visit (HOSPITAL_COMMUNITY): Payer: 59 | Admitting: Anesthesiology

## 2014-08-08 ENCOUNTER — Encounter (HOSPITAL_COMMUNITY): Payer: Self-pay | Admitting: Anesthesiology

## 2014-08-08 ENCOUNTER — Encounter (HOSPITAL_COMMUNITY)
Admission: AD | Disposition: A | Discharge status: Home / Self Care | Payer: Self-pay | Source: Ambulatory Visit | Attending: Orthopedic Surgery

## 2014-08-08 DIAGNOSIS — S5290XA Unspecified fracture of unspecified forearm, initial encounter for closed fracture: Secondary | ICD-10-CM | POA: Diagnosis present

## 2014-08-08 DIAGNOSIS — S52571A Other intraarticular fracture of lower end of right radius, initial encounter for closed fracture: Secondary | ICD-10-CM | POA: Diagnosis not present

## 2014-08-08 DIAGNOSIS — S52611A Displaced fracture of right ulna styloid process, initial encounter for closed fracture: Secondary | ICD-10-CM | POA: Diagnosis not present

## 2014-08-08 DIAGNOSIS — Z880 Allergy status to penicillin: Secondary | ICD-10-CM | POA: Insufficient documentation

## 2014-08-08 DIAGNOSIS — M8580 Other specified disorders of bone density and structure, unspecified site: Secondary | ICD-10-CM | POA: Diagnosis not present

## 2014-08-08 DIAGNOSIS — Z882 Allergy status to sulfonamides status: Secondary | ICD-10-CM | POA: Insufficient documentation

## 2014-08-08 DIAGNOSIS — X58XXXA Exposure to other specified factors, initial encounter: Secondary | ICD-10-CM | POA: Insufficient documentation

## 2014-08-08 DIAGNOSIS — M1990 Unspecified osteoarthritis, unspecified site: Secondary | ICD-10-CM | POA: Insufficient documentation

## 2014-08-08 DIAGNOSIS — F1099 Alcohol use, unspecified with unspecified alcohol-induced disorder: Secondary | ICD-10-CM | POA: Insufficient documentation

## 2014-08-08 DIAGNOSIS — F329 Major depressive disorder, single episode, unspecified: Secondary | ICD-10-CM | POA: Diagnosis not present

## 2014-08-08 DIAGNOSIS — Z9049 Acquired absence of other specified parts of digestive tract: Secondary | ICD-10-CM | POA: Diagnosis not present

## 2014-08-08 DIAGNOSIS — E785 Hyperlipidemia, unspecified: Secondary | ICD-10-CM | POA: Insufficient documentation

## 2014-08-08 DIAGNOSIS — Z87891 Personal history of nicotine dependence: Secondary | ICD-10-CM | POA: Diagnosis not present

## 2014-08-08 HISTORY — PX: OPEN REDUCTION INTERNAL FIXATION (ORIF) DISTAL RADIAL FRACTURE: SHX5989

## 2014-08-08 LAB — CBC
HCT: 32.8 % — ABNORMAL LOW (ref 36.0–46.0)
HEMOGLOBIN: 11.3 g/dL — AB (ref 12.0–15.0)
MCH: 30.5 pg (ref 26.0–34.0)
MCHC: 34.5 g/dL (ref 30.0–36.0)
MCV: 88.4 fL (ref 78.0–100.0)
PLATELETS: 168 10*3/uL (ref 150–400)
RBC: 3.71 MIL/uL — ABNORMAL LOW (ref 3.87–5.11)
RDW: 13.2 % (ref 11.5–15.5)
WBC: 4.5 10*3/uL (ref 4.0–10.5)

## 2014-08-08 SURGERY — OPEN REDUCTION INTERNAL FIXATION (ORIF) DISTAL RADIUS FRACTURE
Anesthesia: General | Laterality: Right

## 2014-08-08 MED ORDER — SODIUM CHLORIDE 0.9 % IV SOLN
INTRAVENOUS | Status: DC | PRN
  Administered 2014-08-08: 15:00:00 via INTRAVENOUS

## 2014-08-08 MED ORDER — SODIUM CHLORIDE 0.45 % IV SOLN: INTRAVENOUS | Status: DC

## 2014-08-08 MED ORDER — CITALOPRAM HYDROBROMIDE 20 MG PO TABS
20.0000 mg | ORAL_TABLET | Freq: Every day | ORAL | Status: DC
  Administered 2014-08-08 – 2014-08-09 (×2): 20 mg via ORAL
  Filled 2014-08-08 (×2): qty 1

## 2014-08-08 MED ORDER — ONDANSETRON HCL 4 MG/2ML IJ SOLN
INTRAMUSCULAR | Status: AC
  Filled 2014-08-08: qty 2

## 2014-08-08 MED ORDER — PROPOFOL 10 MG/ML IV BOLUS
INTRAVENOUS | Status: AC
  Filled 2014-08-08: qty 20

## 2014-08-08 MED ORDER — OXYCODONE HCL 5 MG PO TABS
ORAL_TABLET | ORAL | Status: AC
  Administered 2014-08-08: 10 mg via ORAL
  Filled 2014-08-08: qty 2

## 2014-08-08 MED ORDER — MORPHINE SULFATE 2 MG/ML IJ SOLN
1.0000 mg | INTRAMUSCULAR | Status: DC | PRN
  Administered 2014-08-09 (×2): 1 mg via INTRAVENOUS
  Filled 2014-08-08 (×2): qty 1

## 2014-08-08 MED ORDER — ONDANSETRON HCL 4 MG PO TABS: 4.0000 mg | ORAL_TABLET | Freq: Four times a day (QID) | ORAL | Status: DC | PRN

## 2014-08-08 MED ORDER — ONDANSETRON HCL 4 MG/2ML IJ SOLN
INTRAMUSCULAR | Status: DC | PRN
  Administered 2014-08-08: 4 mg via INTRAVENOUS

## 2014-08-08 MED ORDER — HYDROMORPHONE HCL 1 MG/ML IJ SOLN: 0.2500 mg | INTRAMUSCULAR | Status: DC | PRN

## 2014-08-08 MED ORDER — DEXAMETHASONE SODIUM PHOSPHATE 4 MG/ML IJ SOLN
INTRAMUSCULAR | Status: DC | PRN
  Administered 2014-08-08: 8 mg via INTRAVENOUS

## 2014-08-08 MED ORDER — MIDAZOLAM HCL 2 MG/2ML IJ SOLN
0.5000 mg | Freq: Once | INTRAMUSCULAR | Status: DC | PRN
Start: 2014-08-08 — End: 2014-08-08

## 2014-08-08 MED ORDER — LIDOCAINE HCL (CARDIAC) 20 MG/ML IV SOLN
INTRAVENOUS | Status: AC
  Filled 2014-08-08: qty 5

## 2014-08-08 MED ORDER — METHOCARBAMOL 500 MG PO TABS
ORAL_TABLET | ORAL | Status: AC
Start: 2014-08-08 — End: 2014-08-08
  Administered 2014-08-08: 500 mg via ORAL
  Filled 2014-08-08: qty 1

## 2014-08-08 MED ORDER — DEXAMETHASONE SODIUM PHOSPHATE 4 MG/ML IJ SOLN
INTRAMUSCULAR | Status: AC
  Filled 2014-08-08: qty 2

## 2014-08-08 MED ORDER — FAMOTIDINE 20 MG PO TABS: 20.0000 mg | ORAL_TABLET | Freq: Two times a day (BID) | ORAL | Status: DC | PRN

## 2014-08-08 MED ORDER — POTASSIUM CHLORIDE 2 MEQ/ML IV SOLN
INTRAVENOUS | Status: DC
  Administered 2014-08-08 – 2014-08-09 (×2): via INTRAVENOUS
  Filled 2014-08-08 (×6): qty 1000

## 2014-08-08 MED ORDER — PROMETHAZINE HCL 25 MG/ML IJ SOLN: 6.2500 mg | INTRAMUSCULAR | Status: DC | PRN

## 2014-08-08 MED ORDER — BUPIVACAINE-EPINEPHRINE (PF) 0.5% -1:200000 IJ SOLN
INTRAMUSCULAR | Status: DC | PRN
  Administered 2014-08-08: 30 mL via PERINEURAL

## 2014-08-08 MED ORDER — FLUTICASONE PROPIONATE 50 MCG/ACT NA SUSP
2.0000 | Freq: Every day | NASAL | Status: DC
  Administered 2014-08-08 – 2014-08-09 (×2): 2 via NASAL
  Filled 2014-08-08: qty 16

## 2014-08-08 MED ORDER — ACETAMINOPHEN 325 MG PO TABS
ORAL_TABLET | ORAL | Status: AC
  Filled 2014-08-08: qty 1

## 2014-08-08 MED ORDER — PROMETHAZINE HCL 25 MG RE SUPP: 12.5000 mg | Freq: Four times a day (QID) | RECTAL | Status: DC | PRN

## 2014-08-08 MED ORDER — METHOCARBAMOL 500 MG PO TABS
500.0000 mg | ORAL_TABLET | Freq: Four times a day (QID) | ORAL | Status: DC | PRN
  Administered 2014-08-08 – 2014-08-10 (×5): 500 mg via ORAL
  Filled 2014-08-08 (×6): qty 1

## 2014-08-08 MED ORDER — LACTATED RINGERS IV SOLN
INTRAVENOUS | Status: DC
  Administered 2014-08-08 (×2): via INTRAVENOUS

## 2014-08-08 MED ORDER — VANCOMYCIN HCL 10 G IV SOLR
1500.0000 mg | INTRAVENOUS | Status: AC
  Administered 2014-08-08 (×2): 1500 mg via INTRAVENOUS
  Filled 2014-08-08: qty 1500

## 2014-08-08 MED ORDER — SODIUM CHLORIDE 0.9 % IJ SOLN
INTRAMUSCULAR | Status: AC
  Filled 2014-08-08: qty 10

## 2014-08-08 MED ORDER — LIDOCAINE-EPINEPHRINE (PF) 1.5 %-1:200000 IJ SOLN
INTRAMUSCULAR | Status: DC | PRN
  Administered 2014-08-08: 25 mL via PERINEURAL

## 2014-08-08 MED ORDER — FENTANYL CITRATE (PF) 100 MCG/2ML IJ SOLN
INTRAMUSCULAR | Status: AC
  Administered 2014-08-08 (×5): 50 ug via INTRAVENOUS
  Filled 2014-08-08: qty 2

## 2014-08-08 MED ORDER — BUPIVACAINE HCL (PF) 0.25 % IJ SOLN
INTRAMUSCULAR | Status: AC
  Filled 2014-08-08: qty 30

## 2014-08-08 MED ORDER — MIDAZOLAM HCL 2 MG/2ML IJ SOLN
INTRAMUSCULAR | Status: AC
  Administered 2014-08-08 (×2): 1 mg via INTRAVENOUS
  Filled 2014-08-08: qty 2

## 2014-08-08 MED ORDER — ALPRAZOLAM 0.5 MG PO TABS: 0.5000 mg | ORAL_TABLET | Freq: Four times a day (QID) | ORAL | Status: DC | PRN

## 2014-08-08 MED ORDER — CHLORHEXIDINE GLUCONATE 4 % EX LIQD: 60.0000 mL | Freq: Once | CUTANEOUS | Status: DC

## 2014-08-08 MED ORDER — OXYCODONE HCL 5 MG PO TABS
5.0000 mg | ORAL_TABLET | ORAL | Status: DC | PRN
  Administered 2014-08-08 – 2014-08-10 (×8): 10 mg via ORAL
  Filled 2014-08-08 (×7): qty 2

## 2014-08-08 MED ORDER — 0.9 % SODIUM CHLORIDE (POUR BTL) OPTIME
TOPICAL | Status: DC | PRN
  Administered 2014-08-08: 1000 mL

## 2014-08-08 MED ORDER — PROPOFOL 10 MG/ML IV BOLUS
INTRAVENOUS | Status: DC | PRN
  Administered 2014-08-08: 170 mg via INTRAVENOUS
  Administered 2014-08-08: 30 mg via INTRAVENOUS

## 2014-08-08 MED ORDER — ONDANSETRON HCL 4 MG/2ML IJ SOLN
4.0000 mg | Freq: Four times a day (QID) | INTRAMUSCULAR | Status: DC | PRN
Start: 2014-08-08 — End: 2014-08-10

## 2014-08-08 MED ORDER — MEPERIDINE HCL 25 MG/ML IJ SOLN: 6.2500 mg | INTRAMUSCULAR | Status: DC | PRN

## 2014-08-08 MED ORDER — FENTANYL CITRATE (PF) 100 MCG/2ML IJ SOLN: 50.0000 ug | INTRAMUSCULAR | Status: DC | PRN

## 2014-08-08 MED ORDER — MIDAZOLAM HCL 2 MG/2ML IJ SOLN
INTRAMUSCULAR | Status: AC
  Filled 2014-08-08: qty 2

## 2014-08-08 MED ORDER — FENTANYL CITRATE (PF) 250 MCG/5ML IJ SOLN
INTRAMUSCULAR | Status: AC
  Filled 2014-08-08: qty 5

## 2014-08-08 MED ORDER — LIDOCAINE HCL (CARDIAC) 20 MG/ML IV SOLN
INTRAVENOUS | Status: DC | PRN
  Administered 2014-08-08: 40 mg via INTRAVENOUS

## 2014-08-08 MED ORDER — DOCUSATE SODIUM 100 MG PO CAPS
100.0000 mg | ORAL_CAPSULE | Freq: Two times a day (BID) | ORAL | Status: DC
  Administered 2014-08-08 – 2014-08-09 (×3): 100 mg via ORAL
  Filled 2014-08-08 (×3): qty 1

## 2014-08-08 MED ORDER — DEXTROSE 5 % IV SOLN
500.0000 mg | Freq: Four times a day (QID) | INTRAVENOUS | Status: DC | PRN
  Filled 2014-08-08: qty 5

## 2014-08-08 MED ORDER — VITAMIN C 500 MG PO TABS
1000.0000 mg | ORAL_TABLET | Freq: Every day | ORAL | Status: DC
  Administered 2014-08-08 – 2014-08-09 (×2): 1000 mg via ORAL
  Filled 2014-08-08 (×2): qty 2

## 2014-08-08 MED ORDER — MIDAZOLAM HCL 2 MG/2ML IJ SOLN: 1.0000 mg | INTRAMUSCULAR | Status: DC | PRN

## 2014-08-08 MED ORDER — EPHEDRINE SULFATE 50 MG/ML IJ SOLN
INTRAMUSCULAR | Status: AC
  Filled 2014-08-08: qty 1

## 2014-08-08 MED ORDER — BUPIVACAINE HCL (PF) 0.25 % IJ SOLN
INTRAMUSCULAR | Status: DC | PRN
  Administered 2014-08-08: .1 mL

## 2014-08-08 SURGICAL SUPPLY — 62 items
BANDAGE ELASTIC 3 VELCRO ST LF (GAUZE/BANDAGES/DRESSINGS) ×2 IMPLANT
BANDAGE ELASTIC 4 VELCRO ST LF (GAUZE/BANDAGES/DRESSINGS) ×2 IMPLANT
BIT DRILL 2.2 SS TIBIAL (BIT) ×2 IMPLANT
BLADE SURG ROTATE 9660 (MISCELLANEOUS) IMPLANT
BNDG ESMARK 4X9 LF (GAUZE/BANDAGES/DRESSINGS) ×2 IMPLANT
BNDG GAUZE ELAST 4 BULKY (GAUZE/BANDAGES/DRESSINGS) ×2 IMPLANT
CORDS BIPOLAR (ELECTRODE) ×2 IMPLANT
COVER SURGICAL LIGHT HANDLE (MISCELLANEOUS) ×2 IMPLANT
CUFF TOURNIQUET SINGLE 18IN (TOURNIQUET CUFF) ×2 IMPLANT
CUFF TOURNIQUET SINGLE 24IN (TOURNIQUET CUFF) ×2 IMPLANT
DECANTER SPIKE VIAL GLASS SM (MISCELLANEOUS) IMPLANT
DRAIN TLS ROUND 10FR (DRAIN) IMPLANT
DRAPE OEC MINIVIEW 54X84 (DRAPES) IMPLANT
DRAPE U-SHAPE 47X51 STRL (DRAPES) ×2 IMPLANT
DRSG ADAPTIC 3X8 NADH LF (GAUZE/BANDAGES/DRESSINGS) ×2 IMPLANT
GAUZE SPONGE 4X4 12PLY STRL (GAUZE/BANDAGES/DRESSINGS) ×2 IMPLANT
GAUZE XEROFORM 5X9 LF (GAUZE/BANDAGES/DRESSINGS) ×2 IMPLANT
GLOVE ORTHO TXT STRL SZ7.5 (GLOVE) ×2 IMPLANT
GLOVE SS BIOGEL STRL SZ 8 (GLOVE) ×1 IMPLANT
GLOVE SUPERSENSE BIOGEL SZ 8 (GLOVE) ×1
GOWN STRL REUS W/ TWL LRG LVL3 (GOWN DISPOSABLE) ×3 IMPLANT
GOWN STRL REUS W/ TWL XL LVL3 (GOWN DISPOSABLE) ×3 IMPLANT
GOWN STRL REUS W/TWL LRG LVL3 (GOWN DISPOSABLE) ×3
GOWN STRL REUS W/TWL XL LVL3 (GOWN DISPOSABLE) ×3
KIT BASIN OR (CUSTOM PROCEDURE TRAY) ×2 IMPLANT
KIT ROOM TURNOVER OR (KITS) ×2 IMPLANT
LOOP VESSEL MAXI BLUE (MISCELLANEOUS) IMPLANT
MANIFOLD NEPTUNE II (INSTRUMENTS) ×2 IMPLANT
NEEDLE 22X1 1/2 (OR ONLY) (NEEDLE) IMPLANT
NS IRRIG 1000ML POUR BTL (IV SOLUTION) ×2 IMPLANT
PACK ORTHO EXTREMITY (CUSTOM PROCEDURE TRAY) ×2 IMPLANT
PAD ABD 8X10 STRL (GAUZE/BANDAGES/DRESSINGS) ×2 IMPLANT
PAD ARMBOARD 7.5X6 YLW CONV (MISCELLANEOUS) ×4 IMPLANT
PAD CAST 4YDX4 CTTN HI CHSV (CAST SUPPLIES) ×1 IMPLANT
PADDING CAST ABS 4INX4YD NS (CAST SUPPLIES) ×1
PADDING CAST ABS COTTON 4X4 ST (CAST SUPPLIES) ×1 IMPLANT
PADDING CAST COTTON 4X4 STRL (CAST SUPPLIES) ×1
PEG LOCKING SMOOTH 2.2X18 (Peg) ×6 IMPLANT
PEG LOCKING SMOOTH 2.2X20 (Screw) ×6 IMPLANT
PLATE DVR CROSSLOCK STD RT (Plate) ×2 IMPLANT
SCREW LOCK 14X2.7X 3 LD TPR (Screw) ×1 IMPLANT
SCREW LOCK 20X2.7X 3 LD TPR (Screw) ×1 IMPLANT
SCREW LOCKING 2.7X13MM (Screw) ×2 IMPLANT
SCREW LOCKING 2.7X14 (Screw) ×1 IMPLANT
SCREW LOCKING 2.7X15MM (Screw) ×4 IMPLANT
SCREW LOCKING 2.7X20MM (Screw) ×1 IMPLANT
SPLINT PLASTER CAST XFAST 5X30 (CAST SUPPLIES) ×1 IMPLANT
SPLINT PLASTER XFAST SET 5X30 (CAST SUPPLIES) ×1
SPONGE GAUZE 4X4 12PLY STER LF (GAUZE/BANDAGES/DRESSINGS) ×2 IMPLANT
SPONGE LAP 4X18 X RAY DECT (DISPOSABLE) IMPLANT
SUT MNCRL AB 4-0 PS2 18 (SUTURE) ×2 IMPLANT
SUT PROLENE 3 0 PS 2 (SUTURE) IMPLANT
SUT PROLENE 4 0 PS 2 18 (SUTURE) ×2 IMPLANT
SUT VIC AB 3-0 FS2 27 (SUTURE) IMPLANT
SYR CONTROL 10ML LL (SYRINGE) IMPLANT
SYSTEM CHEST DRAIN TLS 7FR (DRAIN) ×2 IMPLANT
TOWEL OR 17X24 6PK STRL BLUE (TOWEL DISPOSABLE) ×2 IMPLANT
TOWEL OR 17X26 10 PK STRL BLUE (TOWEL DISPOSABLE) ×4 IMPLANT
TUBE CONNECTING 12X1/4 (SUCTIONS) ×2 IMPLANT
TUBE EVACUATION TLS (MISCELLANEOUS) ×2 IMPLANT
UNDERPAD 30X30 INCONTINENT (UNDERPADS AND DIAPERS) ×2 IMPLANT
WATER STERILE IRR 1000ML POUR (IV SOLUTION) ×2 IMPLANT

## 2014-08-08 NOTE — H&P (Signed)
Monique Gonzalez is an 59 y.o. female.   Chief Complaint: "fell and broke my wrist" HPI: The patient is a pleasant 59 yo female who presented to the ER last night after a fall. Patient was working in her yard and fell backwards, landing on an outstretched hand. The patient presented to [name] for evaluation of her right wrist. She was noted to have a comminuted distal radius and ulna fracture. Patient was placed into splint and referred to our office setting. We discussed with the patient given the degree of comminution and angulation, we would recommend surgical intervention in the form of an ORIF. Patient understands and desires to proceed. She denies current numbness or tingling of the hand, denies injury to the remaining portion of the extremity.  Past Medical History  Diagnosis Date  . Seasonal allergies   . Osteoarthritis   . Depression   . Hyperlipidemia   . Recurrent cold sores   . Syncope and collapse 11/30/2012    Normal EEG.  Vaso vagal syncope  . Colitis 09/2012    infectious vs inflammatory.   . Allergy   . Colon stricture   . Osteopenia     Past Surgical History  Procedure Laterality Date  . Tonsillectomy    . Colonoscopy N/A 10/12/2013    Procedure: COLONOSCOPY;  Surgeon: [name], MD;  [facility] ENDOSCOPY;  Service: Endoscopy;  Laterality: N/A;  . Colon resection N/A 10/14/2013    Procedure: LAPAROSCOPIC MOBILIZATION OF SPLENIC FLEXURE, LAPAROSCOPIC EXTENDED LEFT COLECTOMY;  Surgeon: [name], MD;  Location: [facility];  Service: General;  Laterality: N/A;    Family History  Problem Relation Age of Onset  . Sudden death Neg Hx   . Hypertension Neg Hx   . Hyperlipidemia Neg Hx   . Heart attack Neg Hx   . Diabetes Neg Hx   . Rectal cancer Neg Hx   . Stomach cancer Neg Hx   . Esophageal cancer Neg Hx   . Stroke Mother   . Arthritis Mother   . Cancer Father   . Arthritis Sister   . Cancer Maternal Grandmother   . Stroke Maternal Grandfather   . Cancer  Maternal Grandfather   . Colon cancer Maternal Grandfather   . Cancer Paternal Grandmother   . Heart disease Paternal Grandmother   . Colon cancer Paternal Grandmother   . Stroke Paternal Grandfather   . Arthritis Sister    Social History:  reports that she quit smoking about 20 years ago. She has never used smokeless tobacco. She reports that she drinks alcohol. She reports that she does not use illicit drugs.  Allergies:  Allergies  Allergen Reactions  . Penicillins Rash  . Sulfa Antibiotics Rash    No prescriptions prior to admission    No results found for this or any previous visit (from the past 48 hour(s)). Dg Wrist Complete Right  08/07/2014   CLINICAL DATA:  Fall, landing onto right wrist, pain/swelling  EXAM: RIGHT WRIST - COMPLETE 3+ VIEW  COMPARISON:  None.  FINDINGS: Comminuted, intra-articular distal radial fracture.  Suspected nondisplaced ulnar styloid fracture.  Associated soft tissue swelling.  IMPRESSION: Comminuted, intra-articular distal radial fracture.  Nondisplaced ulnar styloid fracture.   Electronically Signed   By: [name]   On: 08/07/2014 15:28    Review of Systems  Constitutional: Negative.   HENT: Negative.   Eyes: Negative.   Cardiovascular: Negative.   Gastrointestinal: Negative.   Musculoskeletal:  See HPI  Skin: Negative.      Physical Exam  Examinaton of the upper extremity: refill intact, sensation intact, excellent digital rom, no signs of compartment syndrome, splint clean dry and intact Assessment/Plan Right distal radius and ulna fracture Patient Active Problem List   Diagnosis Date Noted  . Constipation 09/20/2013  . Syncope and collapse 11/30/2012  . Recurrent cold sores   . Right knee pain 11/23/2011  . Osteoarthritis   . Depression   . Hyperlipidemia    .We are planning surgery for your upper extremity. The risk and benefits of surgery to include risk of bleeding, infection, anesthesia,  damage to  normal structures and failure of the surgery to accomplish its intended goals of relieving symptoms and restoring function have been discussed in detail. With this in mind we plan to proceed. I have specifically discussed with the patient the pre-and postoperative regime and the dos and don'ts and risk and benefits in great detail. Risk and benefits of surgery also include risk of dystrophy(CRPS), chronic nerve pain, failure of the healing process to go onto completion and other inherent risks of surgery The relavent the pathophysiology of the disease/injury process, as well as the alternatives for treatment and postoperative course of action has been discussed in great detail with the patient who desires to proceed.  We will do everything in our power to help you (the patient) restore function to the upper extremity. It is a pleasure to see this patient today.   [name] 08/08/2014, 9:46 AM

## 2014-08-08 NOTE — Progress Notes (Signed)
Orthopedic Tech Progress Note Patient Details:  Monique Gonzalez Jul 26, 1955 [id number]  Ortho Devices Ortho Device/Splint Location: [name] sling RUE Ortho Device/Splint Interventions: Ordered, Application   [name] 08/08/2014, 7:26 PM

## 2014-08-08 NOTE — Anesthesia Preprocedure Evaluation (Addendum)
Anesthesia Evaluation  Patient identified by MRN, date of birth, ID band Patient awake    Reviewed: Allergy & Precautions, NPO status , Patient's Chart, lab work & pertinent test results  History of Anesthesia Complications Negative for: history of anesthetic complications  Airway Mallampati: II  TM Distance: >3 FB Neck ROM: Full    Dental  (+) Teeth Intact, Dental Advisory Given   Pulmonary former smoker (quit 1995),  breath sounds clear to auscultation        Cardiovascular - anginanegative cardio ROS  Rhythm:Regular Rate:Normal     Neuro/Psych negative neurological ROS     GI/Hepatic Neg liver ROS, GERD-  Controlled,H/o colitis   Endo/Other  Morbid obesity  Renal/GU negative Renal ROS     Musculoskeletal negative musculoskeletal ROS (+)   Abdominal (+) + obese,   Peds  Hematology negative hematology ROS (+)   Anesthesia Other Findings   Reproductive/Obstetrics                           Anesthesia Physical Anesthesia Plan  ASA: II  Anesthesia Plan: General   Post-op Pain Management:    Induction: Intravenous  Airway Management Planned: LMA  Additional Equipment:   Intra-op Plan:   Post-operative Plan:   Informed Consent: I have reviewed the patients History and Physical, chart, labs and discussed the procedure including the risks, benefits and alternatives for the proposed anesthesia with the patient or authorized representative who has indicated his/her understanding and acceptance.   Dental advisory given  Plan Discussed with: CRNA and Surgeon  Anesthesia Plan Comments: (Plan routine monitors, GA- LMA OK, axillary block for post op analgesia)        Anesthesia Quick Evaluation

## 2014-08-08 NOTE — Anesthesia Procedure Notes (Addendum)
Anesthesia Regional Block:  Axillary brachial plexus block  Pre-Anesthetic Checklist: ,, timeout performed, Correct Patient, Correct Site, Correct Laterality, Correct Procedure, Correct Position, site marked, Risks and benefits discussed,  Surgical consent,  Pre-op evaluation,  At surgeon's request and post-op pain management  Laterality: Right and Upper  Prep: chloraprep       Needles:  Injection technique: Single-shot  Needle Type: Other   (short "B" bevel)    Needle Gauge: 22 and 22 G    Additional Needles:  Procedures: paresthesia technique Axillary brachial plexus block  Nerve Stimulator or Paresthesia:  Response: transient median nerve paresthesia,  Response: transient ulnar nerve paresthesia,   Additional Responses:   Narrative:  Start time: 08/08/2014 2:33 PM End time: 08/08/2014 2:37 PM Injection made incrementally with aspirations every 5 mL.  Performed by: Personally  Anesthesiologist: Glennon Mac, [place]  Additional Notes: Pt identified in Holding room.  Monitors applied. Working IV access confirmed. Sterile prep R axilla.  #[id number] "B" bevel to transient median, then ulnar nerve paresthesiae. Total 25cc 1.5% Lidocaine with 1:200kepi and 30cc 0.5% Bupivacaine with 1:200k epi injected incrementally after negative test doses, distributed around each paresthesia.  Patient asymptomatic, VSS, no heme aspirated, tolerated well.  Jenita Seashore, MD   Procedure Name: LMA Insertion Date/Time: 08/08/2014 3:55 PM Performed by: Jacquiline Doe A Pre-anesthesia Checklist: Patient identified, Timeout performed, Emergency Drugs available, Suction available and Patient being monitored Patient Re-evaluated:Patient Re-evaluated prior to inductionOxygen Delivery Method: Circle system utilized Preoxygenation: Pre-oxygenation with 100% oxygen Intubation Type: IV induction Ventilation: Mask ventilation without difficulty LMA: LMA inserted LMA Size: 4.0 Tube type: Oral Number of  attempts: 1 Placement Confirmation: breath sounds checked- equal and bilateral and positive ETCO2 Tube secured with: Tape Dental Injury: Teeth and Oropharynx as per pre-operative assessment

## 2014-08-08 NOTE — Transfer of Care (Signed)
Immediate Anesthesia Transfer of Care Note  Patient: [name]  Procedure(s) Performed: Procedure(s) with comments: OPEN REDUCTION INTERNAL FIXATION (ORIF) DISTAL RADIAL FRACTURE (Right) - DVR Crosslock, MD available sometime around 1430-1500  Patient Location: PACU  Anesthesia Type:GA combined with regional for post-op pain  Level of Consciousness: awake, alert , oriented, patient cooperative and responds to stimulation  Airway & Oxygen Therapy: Patient Spontanous Breathing and Patient connected to nasal cannula oxygen  Post-op Assessment: Report given to RN, Post -op Vital signs reviewed and stable, Patient moving all extremities and Patient moving all extremities X 4  Post vital signs: Reviewed and stable  Last Vitals:  Filed Vitals:   08/08/14 1216  BP: 114/74  Pulse: 59  Temp: 36.9 C  Resp: 18    Complications: No apparent anesthesia complications

## 2014-08-08 NOTE — Op Note (Signed)
See dictation#[id number] Amedeo Plenty MD

## 2014-08-09 ENCOUNTER — Encounter (HOSPITAL_COMMUNITY): Payer: Self-pay | Admitting: Orthopedic Surgery

## 2014-08-09 DIAGNOSIS — S52571A Other intraarticular fracture of lower end of right radius, initial encounter for closed fracture: Secondary | ICD-10-CM | POA: Diagnosis not present

## 2014-08-09 LAB — CREATININE, SERUM
CREATININE: 0.97 mg/dL (ref 0.44–1.00)
GFR calc non Af Amer: >60 mL/min (ref >60)

## 2014-08-09 MED ORDER — VANCOMYCIN HCL IN DEXTROSE 1-5 GM/200ML-% IV SOLN
1000.0000 mg | Freq: Three times a day (TID) | INTRAVENOUS | Status: DC
  Administered 2014-08-09 – 2014-08-10 (×4): 1000 mg via INTRAVENOUS
  Filled 2014-08-09 (×6): qty 200

## 2014-08-09 NOTE — Progress Notes (Signed)
Subjective: 1 Day Post-Op Procedure(s) (LRB): OPEN REDUCTION INTERNAL FIXATION (ORIF) DISTAL RADIAL FRACTURE (Right) Patient reports pain as increased after the block has diminished. She states that at this point in time the oral pain medications in combination of IV pain medications and all providing some relief however.  She denies nausea, vomiting, shortness of breath or fevers or chills.   Objective: Vital signs in last 24 hours: Temp:  [97.6 F (36.4 C)-98.6 F (37 C)] 97.6 F (36.4 C) (06/01 1327) Pulse Rate:  [53-83] 58 (06/01 1327) Resp:  [16-18] 18 (06/01 1327) BP: (122-139)/(51-62) 139/62 mmHg (06/01 1327) SpO2:  [96 %-100 %] 100 % (06/01 1327)  Intake/Output from previous day: 05/31 0701 - 06/01 0700 In: 1450 [I.V.:1450] Out: 525 [Urine:500; Blood:25] Intake/Output this shift:     Recent Labs  08/08/14 1305  HGB 11.3*    Recent Labs  08/08/14 1305  WBC 4.5  RBC 3.71*  HCT 32.8*  PLT 168    Recent Labs  08/08/14 2225  CREATININE 0.97   No results for input(s): LABPT, INR in the last 72 hours. Examination of the right upper extremity shows that her splint is clean and intact she has excellent digital range of motion, no signs of infection the drain is removed without difficulties  .The patient is alert and oriented in no acute distress. The patient complains of pain in the affected upper extremity.  The patient is noted to have a normal HEENT exam. Lung fields show equal chest expansion and We will continue pain management overnight and tentatively plan for discharge home tomorrow morning. no shortness of breath. Abdomen exam is nontender without distention. Lower extremity examination does not show any fracture dislocation or blood clot symptoms. Pelvis is stable and the neck and back are stable and nontender.   Assessment/Plan: 1 Day Post-Op Procedure(s) (LRB): OPEN REDUCTION INTERNAL FIXATION (ORIF) DISTAL RADIAL FRACTURE (Right) We will continue  pain management overnight and tentatively plan for discharge for tomorrow morning We discussed all discharge issues with the patient. All questions were encouraged and answered. Bernadean Saling L 08/09/2014, 8:27 PM

## 2014-08-09 NOTE — Anesthesia Postprocedure Evaluation (Signed)
  Anesthesia Post-op Note  Patient: Monique Gonzalez  Procedure(s) Performed: Procedure(s) with comments: OPEN REDUCTION INTERNAL FIXATION (ORIF) DISTAL RADIAL FRACTURE (Right) - DVR Crosslock, MD available sometime around 1430-1500  Patient Location: PACU  Anesthesia Type:GA combined with regional for post-op pain  Level of Consciousness: awake and alert   Airway and Oxygen Therapy: Patient Spontanous Breathing  Post-op Pain: none  Post-op Assessment: Post-op Vital signs reviewed  Post-op Vital Signs: Reviewed  Last Vitals:  Filed Vitals:   08/09/14 1327  BP: 139/62  Pulse: 58  Temp: 36.4 C  Resp: 18    Complications: No apparent anesthesia complications

## 2014-08-09 NOTE — Evaluation (Addendum)
Occupational Therapy Evaluation Patient Details Name: Monique Gonzalez MRN: [id number] DOB: 06-28-1955 Today's Date: 08/09/2014    History of Present Illness 59 y.o. s/p fall resulting in ORIF of right wrist due to right radius/ulna fracture.   Clinical Impression   Pt s/p above. Education provided in session and feel pt is safe to d/c home, from OT standpoint. OT signing off.    Follow Up Recommendations  No OT follow up;Supervision - Intermittent    Equipment Recommendations  None recommended by OT    Recommendations for Other Services       Precautions / Restrictions Precautions Precautions: Other (comment) Precaution Comments: no pushing, pulling, lifting with RUE (can use to hold light items) Required Braces or Orthoses: Sling (for comfort) Restrictions Weight Bearing Restrictions: Yes RUE Weight Bearing: Weight bear through elbow only      Mobility Bed Mobility               General bed mobility comments: not assessed  Transfers Overall transfer level: Modified independent               General transfer comment: explained NWB precaution of RUE-pt able to stand with no assist.    Balance Overall balance assessment: History of Falls (fell pulling something out of ground); No LOB in session-balance not formally assessed.                                          ADL Overall ADL's : Needs assistance/impaired Eating/Feeding: Set up;Sitting               Upper Body Dressing : Sitting;Minimal assistance   Lower Body Dressing: Minimal assistance;Sit to/from stand   Toilet Transfer: Modified Independent (chair)           Functional mobility during ADLs: Modified independent General ADL Comments: Pt donned/doffed sling-OT slightly adjusted sling for pt but feel she could have done herself if needed. Educated on UB dressing technique and briefly discussed UB clothing. Educated on safety such as sitting for LB bathing, safe  footwear, and rugs/items on floor. Recommended her sister be with her for bathing/shower transfer.  Educated on edema management techniques. Discussed positioning of sling at 90 degrees.  Suggested plastic wrap for RUE if MD approves showering.     Vision     Perception     Praxis      Pertinent Vitals/Pain Pain Assessment: 0-10 Pain Score: 3  Pain Location: RUE Pain Descriptors / Indicators: Throbbing;Numbness Pain Intervention(s): Monitored during session;Repositioned     Hand Dominance Right   Extremity/Trunk Assessment Upper Extremity Assessment Upper Extremity Assessment: RUE deficits/detail RUE Deficits / Details: able to move shoulder and digits actively RUE: Unable to fully assess due to immobilization   Lower Extremity Assessment Lower Extremity Assessment: Overall WFL for tasks assessed       Communication Communication Communication: No difficulties   Cognition Arousal/Alertness: Awake/alert Behavior During Therapy: WFL for tasks assessed/performed Overall Cognitive Status: Within Functional Limits for tasks assessed                     General Comments       Exercises Exercises: Other exercises Other Exercises Other Exercises: moving digits; OT explained/demonstrated retrograde massage   Shoulder Instructions      Home Living Family/patient expects to be discharged to:: Private residence Living Arrangements: Other relatives;Other (Comment) (sisters  and mother) Available Help at Discharge: Family;Available 24 hours/day Type of Home: House Home Access: Stairs to enter [employer] of Steps: 2 Entrance Stairs-Rails: None Home Layout: Multi-level (has to climb steps to get to her room) Alternate Level Stairs-Number of Steps: 15 Alternate Level Stairs-Rails: Right Bathroom Shower/Tub: Tub/shower unit;Walk-in Psychologist, prison and probation services: Standard     Home Equipment: Shower seat - built in          Prior Functioning/Environment  Level of Independence: Independent             OT Diagnosis: Acute pain   OT Problem List:     OT Treatment/Interventions:      OT Goals(Current goals can be found in the care plan section)    OT Frequency:     Barriers to D/C:            Co-evaluation              End of Session Equipment Utilized During Treatment: Other (comment) (slings)  Activity Tolerance: Patient tolerated treatment well Patient left: in chair   Time: [phone] OT Time Calculation (min): 18 min Charges:  OT General Charges $OT Visit: 1 Procedure OT Evaluation $Initial OT Evaluation Tier I: 1 Procedure G-Codes: OT G-codes **NOT FOR INPATIENT CLASS** Functional Assessment Tool Used: clinical judgment Functional Limitation: Self care Self Care Current Status (Z3007): At least 1 percent but less than 20 percent impaired, limited or restricted Self Care Goal Status (M2263): At least 1 percent but less than 20 percent impaired, limited or restricted Self Care Discharge Status [phone]): At least 1 percent but less than 20 percent impaired, limited or restricted  Benito Mccreedy OTR/L [phone] 08/09/2014, 10:30 AM

## 2014-08-09 NOTE — Op Note (Signed)
NAMECINDERELLA, [name]:  [id number]  MEDICAL RECORD NO.:  [id number]  LOCATION:  5N25C                        FACILITY:  [facility]  PHYSICIAN:  Satira Anis. Brittnie Lewey, M.D.DATE OF BIRTH:  Jul 26, 1955  DATE OF PROCEDURE:  08/08/2014 DATE OF DISCHARGE:                              OPERATIVE REPORT   PREOPERATIVE DIAGNOSIS:  Comminuted complex right distal radius fracture, intra-articular, greater than 3 parts; ulnar styloid fracture.  POSTOPERATIVE DIAGNOSIS:  Comminuted complex right distal radius fracture, intra-articular, greater than 3 parts; ulnar styloid fracture.  PROCEDURES: 1. Open reduction and internal fixation with a Crosslock DVR plate and     screw construct, comminuted complex greater than 3 parts intra-     articular distal radius fracture. 2. AP, lateral, and oblique x-rays were performed, examined, and     interpreted by myself, right wrist. 3. Sliding brachioradialis tenotomy, right wrist. 4. Closed treatment, ulnar styloid fracture, right wrist.  SURGEON:  Satira Anis. Amedeo Plenty, M.D.  ASSISTANT:  None.  COMPLICATIONS:  None.  ANESTHESIA:  General, preoperative block.  TOURNIQUET TIME:  Less than an hour.  DRAINS:  One.  INDICATIONS:  A pleasant female, 59 years of age.  She presents with the above-mentioned diagnosis.  I have counseled her in regard to risks and benefits of the surgery including risk of infection, bleeding, anesthesia, damage to normal structures, and failure of surgery to accomplish its intended goals or relieving symptoms, restoring function. With this in mind, she desires to proceed.  All questions have been encouraged and answered preoperatively.  OPERATIVE PROCEDURE:  The patient was seen by myself and Anesthesia.  A block was placed in the holding area of excellent quality.  The patient underwent a smooth induction of general anesthetic.  A time-out was called.  Pre and postop check was complete.  Preoperative  vancomycin was given.  She was prepped and draped in usual sterile fashion with Hibiclens pre-scrub by myself, followed by Betadine scrub and paint. Sterile field was secured.  Arm was elevated, tourniquet was inflated to 250 mmHg.  A time-out was secured; and following this, insufflation of the tourniquet and volar radial incision was made.  Dissection was carried down.  FCR tendon sheath was incised palmarly and dorsally. Carpal canal contents were retracted ulnarly.  Following this, pronator was incised.  The patient then had sliding brachioradialis tenotomy accomplished without difficulty.  This was a sliding brachioradialis tenotomy.  She tolerated this well.  Following this, the patient then underwent a very careful and cautious approach to the radius with ORIF utilizing a cross lock plate and screw construct.  I was able to achieve adequate radial height, inclination, and volar tilt to my satisfaction without difficulty, and there were no complicating features.  Following this, we then performed live x-ray and evaluated the distal ulnar fracture.  I chose closed treatment of the ulnar styloid fracture given the fact that she was stable in pronation, supination, and neutral. There was no obvious TFC injury.  AP, lateral, and oblique x-rays were performed, examined, and interpreted by myself, looked to be excellent.  At this time, we irrigated with 1 L of saline, closed the pronator with Vicryl.  TLS drain was applied.  Tourniquet deflated, and the skin edges were closed with Prolene.  A short-arm splint was applied without difficulty.  There were no complicating features.  All sponge, needle, and instrument counts were reported as correct.  The patient tolerated this well.  She will be transferred to the recovery room, will be discharged tomorrow after observatory care overnight.  It is a pleasure to see her today and participate in her postop recovery.  I will rehab her  according to our standard DVR protocol.     Satira Anis. Amedeo Plenty, M.D.     [facility]  D:  08/08/2014  T:  08/09/2014  Job:  [id number]

## 2014-08-09 NOTE — Discharge Instructions (Signed)
.  Keep bandage clean and dry.  Call for any problems.  No smoking.  Criteria for driving a car: you should be off your pain medicine for 7-8 hours, able to drive one handed(confident), thinking clearly and feeling able in your judgement to drive. Continue elevation as it will decrease swelling.  If instructed by MD move your fingers within the confines of the bandage/splint.  Use ice if instructed by your MD. Call immediately for any sudden loss of feeling in your hand/arm or change in functional abilities of the extremity. .We recommend that you to take vitamin C 1000 mg a day to promote healing. We also recommend that if you require  pain medicine that you take a stool softener to prevent constipation as most pain medicines will have constipation side effects. We recommend either Peri-Colace or Senokot and recommend that you also consider adding MiraLAX to prevent the constipation affects from pain medicine if you are required to use them. These medicines are over the counter and maybe purchased at a local pharmacy. A cup of yogurt and a probiotic can also be helpful during the recovery process as the medicines can disrupt your intestinal environment.

## 2014-08-09 NOTE — Progress Notes (Signed)
ANTIBIOTIC CONSULT NOTE - INITIAL  Pharmacy Consult for Vancomycin  Indication: wrist fracture s/p repair  Allergies  Allergen Reactions  . Penicillins Rash  . Sulfa Antibiotics Rash    Patient Measurements: Weight: 240 lb (108.863 kg) Adjusted Body Weight: 85 kg  Vital Signs: Temp: 98.4 F (36.9 C) (05/31 2204) Temp Source: Oral (05/31 2204) BP: 139/57 mmHg (05/31 2204) Pulse Rate: 83 (05/31 2204) Intake/Output from previous day: 05/31 0701 - 06/01 0700 In: 1450 [I.V.:1450] Out: 525 [Urine:500; Blood:25] Intake/Output from this shift:    Labs:  Recent Labs  08/08/14 1305 08/08/14 2225  WBC 4.5  --   HGB 11.3*  --   PLT 168  --   CREATININE  --  0.97   Estimated Creatinine Clearance: 82.5 mL/min (by C-G formula based on Cr of 0.97). No results for input(s): VANCOTROUGH, VANCOPEAK, VANCORANDOM, GENTTROUGH, GENTPEAK, GENTRANDOM, TOBRATROUGH, TOBRAPEAK, TOBRARND, AMIKACINPEAK, AMIKACINTROU, AMIKACIN in the last 72 hours.   Microbiology: No results found for this or any previous visit (from the past 720 hour(s)).  Medical History: Past Medical History  Diagnosis Date  . Seasonal allergies   . Osteoarthritis   . Depression   . Hyperlipidemia   . Recurrent cold sores   . Syncope and collapse 11/30/2012    Normal EEG.  Vaso vagal syncope  . Colitis 09/2012    infectious vs inflammatory.   . Allergy   . Colon stricture   . Osteopenia     Medications:  Prescriptions prior to admission  Medication Sig Dispense Refill Last Dose  . acetaminophen (TYLENOL) 650 MG CR tablet Take 1,300 mg by mouth at bedtime.   08/06/2014  . acyclovir cream (ZOVIRAX) 5 % Apply 1 application topically every 3 (three) hours. (Patient taking differently: Apply 1 application topically every 3 (three) hours as needed (outbreak). ) 15 g 3 2 weeks ago  . calcium-vitamin D (OSCAL WITH D) 500-200 MG-UNIT per tablet Take 1 tablet by mouth daily.    08/07/2014 at Unknown time  . Cholecalciferol  (VITAMIN D) 1000 UNITS capsule Take 2,000 Units by mouth daily.    08/07/2014 at Unknown time  . citalopram (CELEXA) 20 MG tablet Take 1 tablet (20 mg total) by mouth daily. 90 tablet 3 08/07/2014 at Unknown time  . Cyanocobalamin (VITAMIN B 12 PO) Take 1,000 mcg by mouth daily.   08/07/2014 at Unknown time  . fish oil-omega-3 fatty acids 1000 MG capsule Take 2 g by mouth daily.   08/07/2014 at Unknown time  . fluticasone (FLONASE) 50 MCG/ACT nasal spray Place 2 sprays into both nostrils daily. 16 g 6 08/06/2014  . Glucos-MSM-C-Mn-Ginger-Willow (GLUCOSAMINE MSM COMPLEX) TABS Take 1 tablet by mouth daily.    08/07/2014 at Unknown time  . loratadine (CLARITIN) 10 MG tablet Take 10 mg by mouth daily.     08/07/2014 at Unknown time  . Lysine 500 MG TABS Take 500 mg by mouth daily.    08/07/2014 at Unknown time  . meloxicam (MOBIC) 15 MG tablet Take 1 tablet (15 mg total) by mouth daily. 90 tablet 3 08/07/2014 at Unknown time  . Multiple Vitamins-Minerals (MULTIVITAMIN,TX-MINERALS) tablet Take 1 tablet by mouth daily.     08/07/2014 at Unknown time  . ondansetron (ZOFRAN) 8 MG tablet Take 1 tablet (8 mg total) by mouth 3 (three) times daily. (Patient taking differently: Take 8 mg by mouth every 8 (eight) hours as needed for nausea or vomiting. ) 60 tablet 0 over 30 days  . oxyCODONE-acetaminophen (PERCOCET) 5-325  MG per tablet Take 1 tablet by mouth every 4 (four) hours as needed for severe pain. 20 tablet 0 08/07/2014 at Unknown time  . valACYclovir (VALTREX) 1000 MG tablet TAKE 2 TABLETS BY MOUTH EVERY 12 HOURS FOR 2 DOSES AS NEEDED FOR FLARE 30 tablet 11 1 week ago  . vitamin E 400 UNIT capsule Take 400 Units by mouth daily.     08/07/2014 at Unknown time   Assessment: 59 y.o. female admitted after fall and wrist fracture, s/p ORIF rt radius, for vancomycin.  Vancomycin 1500 mg IV given preop at 1430  Goal of Therapy:  Vancomycin trough level 10-15 mcg/ml  Plan:  Vancomycin 1 g IV q8h  Caryl Pina 08/09/2014,12:26 AM

## 2014-08-09 NOTE — Discharge Summary (Signed)
Physician Discharge Summary  [name] MRN: [id number] DOB/AGE: 61-May-1957 61 y.o.  Admit date: 08/08/2014 Discharge date: 08/09/2014  Admission Diagnoses: STATUS post right distal radius fracture  Discharge Diagnoses: Status post open reduction internal fixation right distal radius fracture . Patient Active Problem List   Diagnosis Date Noted  . Fracture, radius 08/08/2014  . Constipation 09/20/2013  . Syncope and collapse 11/30/2012  . Recurrent cold sores   . Right knee pain 11/23/2011  . Osteoarthritis   . Depression   . Hyperlipidemia    Active Problems:   Fracture, radius   Discharged Condition: stable   Hospital Course: the patient is a pleasant 59 year old female who presented for open reduction internal fixation about her right wrist after a fall which resulted in a comminuted distal radius fracture. We discussed all issues with her in terms of treatment options and our recommendations for surgical intervention. The patient was admitted May 31 she underwent open reduction internal fixation of her right wrist/distal radius fracture, please see operative report for full details, she tolerated the procedure well without complicating features. Please see operative report for full details.  The patient was admitted to the orthopedic & observation, IV antibiotics and pain management stopped her day #1 she was doing fairly well but did have a significant increase in her pain once the preoperative block wore off. We discussed with her all issues and decision made to keep an additional night for pain control. She was moving her fingers without difficulties, she was having no numbness or tingling. She was tolerating a regular diet and voiding without difficulties. The decision was made to discharge her home the following morning, the patient had been given her postoperative medications in the office setting preemptively to include oxycodone, Robaxin, vitamin C and  Peri-Colace. We discussed with her continue elevation, edema control and range of motion about the digits. Consults: none  Significant Diagnostic Studies: None   Treatments: open reduction internal fixation right distal radius fracture, please see operative report   Discharge Exam: Blood pressure 139/62, pulse 58, temperature 97.6 F (36.4 C), temperature source Oral, resp. rate 18, weight 108.863 kg (240 lb), SpO2 100 %. Evaluation right upper extremity shows that she has excellent digital range of motion, her splint is clean dry and intact, neurovascularly she is intact, sensation refill is intact, the drain is pulled without difficulties , no signs of infection or dystrophy are present.  .The patient is alert and oriented in no acute distress. The patient complains of pain in the affected upper extremity.  The patient is noted to have a normal HEENT exam. Lung fields show equal chest expansion and no shortness of breath. Abdomen exam is nontender without distention. Lower extremity examination does not show any fracture dislocation or blood clot symptoms. Pelvis is stable and the neck and back are stable and nontender.  Disposition: 01-Home or Self Care  Discharge Instructions    Call MD / Call 911    Complete by:  As directed   If you experience chest pain or shortness of breath, CALL 911 and be transported to the hospital emergency room.  If you develope a fever above 101 F, pus (white drainage) or increased drainage or redness at the wound, or calf pain, call your surgeon's office.     Constipation Prevention    Complete by:  As directed   Drink plenty of fluids.  Prune juice may be helpful.  You may use a stool softener, such as Colace (  over the counter) 100 mg twice a day.  Use MiraLax (over the counter) for constipation as needed.     Diet - low sodium heart healthy    Complete by:  As directed      Discharge instructions    Complete by:  As directed   .Marland KitchenKeep bandage clean and  dry.  Call for any problems.  No smoking.  Criteria for driving a car: you should be off your pain medicine for 7-8 hours, able to drive one handed(confident), thinking clearly and feeling able in your judgement to drive. Continue elevation as it will decrease swelling.  If instructed by MD move your fingers within the confines of the bandage/splint.  Use ice if instructed by your MD. Call immediately for any sudden loss of feeling in your hand/arm or change in functional abilities of the extremity. .We recommend that you to take vitamin C 1000 mg a day to promote healing. We also recommend that if you require  pain medicine that you take a stool softener to prevent constipation as most pain medicines will have constipation side effects. We recommend either Peri-Colace or Senokot and recommend that you also consider adding MiraLAX to prevent the constipation affects from pain medicine if you are required to use them. These medicines are over the counter and maybe purchased at a local pharmacy. A cup of yogurt and a probiotic can also be helpful during the recovery process as the medicines can disrupt your intestinal environment.     Increase activity slowly as tolerated    Complete by:  As directed             Medication List    STOP taking these medications        calcium-vitamin D 500-200 MG-UNIT per tablet  Commonly known as:  OSCAL WITH D      TAKE these medications        acetaminophen 650 MG CR tablet  Commonly known as:  TYLENOL  Take 1,300 mg by mouth at bedtime.     acyclovir cream 5 %  Commonly known as:  ZOVIRAX  Apply 1 application topically every 3 (three) hours.     citalopram 20 MG tablet  Commonly known as:  CELEXA  Take 1 tablet (20 mg total) by mouth daily.     fish oil-omega-3 fatty acids 1000 MG capsule  Take 2 g by mouth daily.     fluticasone 50 MCG/ACT nasal spray  Commonly known as:  FLONASE  Place 2 sprays into both nostrils daily.     GLUCOSAMINE MSM  COMPLEX Tabs  Take 1 tablet by mouth daily.     loratadine 10 MG tablet  Commonly known as:  CLARITIN  Take 10 mg by mouth daily.     Lysine 500 MG Tabs  Take 500 mg by mouth daily.     meloxicam 15 MG tablet  Commonly known as:  MOBIC  Take 1 tablet (15 mg total) by mouth daily.     multivitamin,tx-minerals tablet  Take 1 tablet by mouth daily.     ondansetron 8 MG tablet  Commonly known as:  ZOFRAN  Take 1 tablet (8 mg total) by mouth 3 (three) times daily.     oxyCODONE-acetaminophen 5-325 MG per tablet  Commonly known as:  PERCOCET  Take 1 tablet by mouth every 4 (four) hours as needed for severe pain.     valACYclovir 1000 MG tablet  Commonly known as:  VALTREX  TAKE 2 TABLETS BY  MOUTH EVERY 12 HOURS FOR 2 DOSES AS NEEDED FOR FLARE     VITAMIN B 12 PO  Take 1,000 mcg by mouth daily.     Vitamin D 1000 UNITS capsule  Take 2,000 Units by mouth daily.     vitamin E 400 UNIT capsule  Take 400 Units by mouth daily.           Follow-up Information    Follow up with Paulene Floor, MD In 2 weeks.   Specialty:  Orthopedic Surgery   Contact information:   [address] [place] [address] [phone]       Signed: Ivan Croft 08/09/2014, 8:19 PM

## 2014-08-10 DIAGNOSIS — S52571A Other intraarticular fracture of lower end of right radius, initial encounter for closed fracture: Secondary | ICD-10-CM | POA: Diagnosis not present

## 2014-12-04 ENCOUNTER — Other Ambulatory Visit: Payer: 59

## 2014-12-04 ENCOUNTER — Other Ambulatory Visit: Payer: Self-pay | Admitting: Family Medicine

## 2014-12-04 DIAGNOSIS — M8580 Other specified disorders of bone density and structure, unspecified site: Secondary | ICD-10-CM

## 2014-12-04 DIAGNOSIS — Z0000 Encounter for general adult medical examination without abnormal findings: Secondary | ICD-10-CM

## 2014-12-04 DIAGNOSIS — D352 Benign neoplasm of pituitary gland: Secondary | ICD-10-CM

## 2014-12-04 DIAGNOSIS — E785 Hyperlipidemia, unspecified: Secondary | ICD-10-CM

## 2014-12-04 LAB — COMPLETE METABOLIC PANEL WITH GFR
ALBUMIN: 4 g/dL (ref 3.6–5.1)
ALK PHOS: 48 U/L (ref 33–130)
ALT: 12 U/L (ref 6–29)
AST: 15 U/L (ref 10–35)
BILIRUBIN TOTAL: 0.3 mg/dL (ref 0.2–1.2)
BUN: 27 mg/dL — ABNORMAL HIGH (ref 7–25)
CALCIUM: 9 mg/dL (ref 8.6–10.4)
CO2: 30 mmol/L (ref 20–31)
Chloride: 101 mmol/L (ref 98–110)
Creat: 0.82 mg/dL (ref 0.50–0.99)
GFR, EST AFRICAN AMERICAN: 89 mL/min (ref >=60)
GFR, EST NON AFRICAN AMERICAN: 77 mL/min (ref >=60)
Glucose, Bld: 90 mg/dL (ref 70–99)
Potassium: 4.8 mmol/L (ref 3.5–5.3)
Sodium: 136 mmol/L (ref 135–146)
TOTAL PROTEIN: 6.2 g/dL (ref 6.1–8.1)

## 2014-12-04 LAB — CBC WITH DIFFERENTIAL/PLATELET
Basophils Absolute: 0 10*3/uL (ref 0.0–0.1)
Basophils Relative: 1 % (ref 0–1)
EOS ABS: 0 10*3/uL (ref 0.0–0.7)
Eosinophils Relative: 1 % (ref 0–5)
HCT: 36.8 % (ref 36.0–46.0)
HEMOGLOBIN: 12.1 g/dL (ref 12.0–15.0)
LYMPHS ABS: 1.3 10*3/uL (ref 0.7–4.0)
Lymphocytes Relative: 32 % (ref 12–46)
MCH: 29.7 pg (ref 26.0–34.0)
MCHC: 32.9 g/dL (ref 30.0–36.0)
MCV: 90.2 fL (ref 78.0–100.0)
MONOS PCT: 12 % (ref 3–12)
MPV: 9 fL (ref 8.6–12.4)
Monocytes Absolute: 0.5 10*3/uL (ref 0.1–1.0)
NEUTROS PCT: 54 % (ref 43–77)
Neutro Abs: 2.2 10*3/uL (ref 1.7–7.7)
PLATELETS: 212 10*3/uL (ref 150–400)
RBC: 4.08 MIL/uL (ref 3.87–5.11)
RDW: 13.9 % (ref 11.5–15.5)
WBC: 4.1 10*3/uL (ref 4.0–10.5)

## 2014-12-04 LAB — LIPID PANEL
CHOL/HDL RATIO: 4.8 ratio (ref <=5.0)
Cholesterol: 264 mg/dL — ABNORMAL HIGH (ref 125–200)
HDL: 55 mg/dL (ref >=46)
LDL CALC: 165 mg/dL — AB (ref <130)
Triglycerides: 220 mg/dL — ABNORMAL HIGH (ref <150)
VLDL: 44 mg/dL — ABNORMAL HIGH (ref <30)

## 2014-12-05 LAB — PROLACTIN: Prolactin: 23.7 ng/mL

## 2014-12-05 LAB — VITAMIN D 25 HYDROXY (VIT D DEFICIENCY, FRACTURES): Vit D, 25-Hydroxy: 33 ng/mL (ref 30–100)

## 2014-12-05 LAB — TSH: TSH: 2.427 u[IU]/mL (ref 0.350–4.500)

## 2014-12-13 ENCOUNTER — Ambulatory Visit (INDEPENDENT_AMBULATORY_CARE_PROVIDER_SITE_OTHER): Payer: 59 | Admitting: Physician Assistant

## 2014-12-13 ENCOUNTER — Encounter: Payer: Self-pay | Admitting: Physician Assistant

## 2014-12-13 VITALS — BP 130/78 | HR 60 | Temp 98.3°F | Resp 16 | Ht 69.0 in | Wt 260.0 lb

## 2014-12-13 DIAGNOSIS — F32A Depression, unspecified: Secondary | ICD-10-CM

## 2014-12-13 DIAGNOSIS — M25561 Pain in right knee: Secondary | ICD-10-CM | POA: Diagnosis not present

## 2014-12-13 DIAGNOSIS — Z0000 Encounter for general adult medical examination without abnormal findings: Secondary | ICD-10-CM

## 2014-12-13 DIAGNOSIS — E785 Hyperlipidemia, unspecified: Secondary | ICD-10-CM | POA: Diagnosis not present

## 2014-12-13 DIAGNOSIS — F329 Major depressive disorder, single episode, unspecified: Secondary | ICD-10-CM

## 2014-12-13 DIAGNOSIS — M179 Osteoarthritis of knee, unspecified: Secondary | ICD-10-CM | POA: Diagnosis not present

## 2014-12-13 DIAGNOSIS — M1711 Unilateral primary osteoarthritis, right knee: Secondary | ICD-10-CM

## 2014-12-13 NOTE — Progress Notes (Signed)
[name] MRN: [id number], DOB: 06/15/1955, 59 y.o. Date of Encounter: 12/13/2014,   Chief Complaint: Physical (CPE)  HPI: 59 y.o. y/o female  here for CPE.   Her last routine follow-up visit scheduled with me was back 06/15/2013.  Today we reviewed her medical issues that have occurred since then.  09/17/13 - 09/20/13----she had admission with constipation and colitis which was felt to be inflammatory versus infectious. 10/03/13 she had office visit with GI, Dr. Ardis Hughs 10/09/13 she went to the ER with GI complaints. 10/09/13 she went back to the ER and was admitted. 10/12/13 she underwent colonoscopy--- revealed stricture in the colon near the splenic flexure. Biopsy negative for cancer. Was felt the stricture was likely secondary to chronic irritation and ischemia. 10/14/13 she underwent surgery--laparoscopic colon resection/cholectomy 12/30/13 she had follow-up colonoscopy. She is to repeat in 10 years.  08/07/14 she had right wrist fracture. 08/08/14 ORIF distal radial fracture  She reports that she is no longer needing any Linzess or over-the-counter stool softeners.  She reports that she is having a lot of problems with her right knee. Is that even to go to the grocery store after walking on it that amount of time and feels that she is limping. She had office visit with Dr. Dennard Schaumann 05/23/14 at which time he did injection to the right knee. Reports that she had minimal relief after that injection. Reviewed that she had x-ray 11/18/2011 that showed osteoarthritis. There are osteophytes and spurring present. Today she says that she "has to do something about this knee "and is wanting referral to orthopedics. Is wanting to see an orthopedic that is with an [employer] insurance plan.  She says that she also has been having pain across both sides of her low back and she points to about L5 level as area of pain. Says that this pain radiates down both sides just down to the upper thighs.  Never has any pain, numbness, or tingling further down the legs. States that this pain is worst when she first wakes up in the morning. Has not had x-ray on this area and many years but says that this is not as bad and she will just monitor for now.  Also she feels that this pain is worsened recently secondary to recent weight gain and also having to hold to her mother who is living with her. She wants to monitor and see if this pain will improve with weight loss.  No other complaints or concerns today.  States that her mood is stable and well controlled with current dose of Celexa. This medication continues to cause no adverse effects.     Review of Systems: Consitutional: No fever, chills, fatigue, night sweats, lymphadenopathy. No significant/unexplained weight changes. Eyes: No visual changes, eye redness, or discharge. ENT/Mouth: No ear pain, sore throat, nasal drainage, or sinus pain. Cardiovascular: No chest pressure,heaviness, tightness or squeezing, even with exertion. No increased shortness of breath or dyspnea on exertion.No palpitations, edema, orthopnea, PND. Respiratory: No cough, hemoptysis, SOB, or wheezing. Gastrointestinal: No anorexia, dysphagia, reflux, pain, nausea, vomiting, hematemesis, diarrhea, constipation, BRBPR, or melena. Breast: No mass, nodules, bulging, or retraction. No skin changes or inflammation. No nipple discharge. No lymphadenopathy. Genitourinary: No dysuria, hematuria, incontinence, vaginal discharge, pruritis, burning, abnormal bleeding, or pain. Musculoskeletal: See HPI.  Skin: No rash, pruritis, or concerning lesions. Neurological: No headache, dizziness, syncope, seizures, tremors, memory loss, coordination problems, or paresthesias. Psychological: No hallucinations, SI/HI. Endocrine: No polydipsia, polyphagia, polyuria, or  known diabetes.No increased fatigue. No palpitations/rapid heart rate. No significant/unexplained weight change. All other  systems were reviewed and are otherwise negative.  Past Medical History  Diagnosis Date  . Seasonal allergies   . Osteoarthritis   . Depression   . Hyperlipidemia   . Recurrent cold sores   . Syncope and collapse 11/30/2012    Normal EEG.  Vaso vagal syncope  . Colitis 09/2012    infectious vs inflammatory.   . Allergy   . Colon stricture ([place])   . Osteopenia      Past Surgical History  Procedure Laterality Date  . Tonsillectomy    . Colonoscopy N/A 10/12/2013    Procedure: COLONOSCOPY;  Surgeon: [name], MD;  Location: [facility] ENDOSCOPY;  Service: Endoscopy;  Laterality: N/A;  . Colon resection N/A 10/14/2013    Procedure: LAPAROSCOPIC MOBILIZATION OF SPLENIC FLEXURE, LAPAROSCOPIC EXTENDED LEFT COLECTOMY;  Surgeon: [name], MD;  Location: [place];  Service: General;  Laterality: N/A;  . Open reduction internal fixation (orif) distal radial fracture Right 08/08/2014    Procedure: OPEN REDUCTION INTERNAL FIXATION (ORIF) DISTAL RADIAL FRACTURE;  Surgeon: Roseanne Kaufman, MD;  Location: [place];  Service: Orthopedics;  Laterality: Right;  DVR Crosslock, MD available sometime around 1430-1500    Home Meds:  Outpatient Prescriptions Prior to Visit  Medication Sig Dispense Refill  . acetaminophen (TYLENOL) 650 MG CR tablet Take 1,300 mg by mouth at bedtime.    Marland Kitchen acyclovir cream (ZOVIRAX) 5 % Apply 1 application topically every 3 (three) hours. (Patient taking differently: Apply 1 application topically every 3 (three) hours as needed (outbreak). ) 15 g 3  . Cholecalciferol (VITAMIN D) 1000 UNITS capsule Take 2,000 Units by mouth daily.     . citalopram (CELEXA) 20 MG tablet Take 1 tablet (20 mg total) by mouth daily. 90 tablet 3  . Cyanocobalamin (VITAMIN B 12 PO) Take 1,000 mcg by mouth daily.    . fish oil-omega-3 fatty acids 1000 MG capsule Take 2 g by mouth daily.    . fluticasone (FLONASE) 50 MCG/ACT nasal spray Place 2 sprays into both nostrils daily. 16 g 6  .  Glucos-MSM-C-Mn-Ginger-Willow (GLUCOSAMINE MSM COMPLEX) TABS Take 1 tablet by mouth daily.     Marland Kitchen loratadine (CLARITIN) 10 MG tablet Take 10 mg by mouth daily.      Marland Kitchen Lysine 500 MG TABS Take 500 mg by mouth daily.     . meloxicam (MOBIC) 15 MG tablet Take 1 tablet (15 mg total) by mouth daily. 90 tablet 3  . Multiple Vitamins-Minerals (MULTIVITAMIN,TX-MINERALS) tablet Take 1 tablet by mouth daily.      . ondansetron (ZOFRAN) 8 MG tablet Take 1 tablet (8 mg total) by mouth 3 (three) times daily. (Patient taking differently: Take 8 mg by mouth every 8 (eight) hours as needed for nausea or vomiting. ) 60 tablet 0  . valACYclovir (VALTREX) 1000 MG tablet TAKE 2 TABLETS BY MOUTH EVERY 12 HOURS FOR 2 DOSES AS NEEDED FOR FLARE 30 tablet 11  . vitamin E 400 UNIT capsule Take 400 Units by mouth daily.      Marland Kitchen oxyCODONE-acetaminophen (PERCOCET) 5-325 MG per tablet Take 1 tablet by mouth every 4 (four) hours as needed for severe pain. 20 tablet 0   No facility-administered medications prior to visit.    Allergies:  Allergies  Allergen Reactions  . Penicillins Rash  . Sulfa Antibiotics Rash    Social History   Social History  . Marital Status: Divorced  Spouse Name: N/A  . Number of Children: 2  . 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 Smoking status: Former Smoker    Quit date: 10/27/1993  . Smokeless tobacco: Never Used  . Alcohol Use: Yes     Comment: one drink per week  . Drug Use: No  . Sexual Activity: Not on file   Other Topics Concern  . Not on file   Social History Narrative    Family History  Problem Relation Age of Onset  . Sudden death Neg Hx   . Hypertension Neg Hx   . Hyperlipidemia Neg Hx   . Heart attack Neg Hx   . Diabetes Neg Hx   . Rectal cancer Neg Hx   . Stomach cancer Neg Hx   . Esophageal cancer Neg Hx   . Stroke Mother   . Arthritis Mother   . Cancer Father   . Arthritis Sister    . Cancer Maternal Grandmother   . Stroke Maternal Grandfather   . Cancer Maternal Grandfather   . Colon cancer Maternal Grandfather   . Cancer Paternal Grandmother   . Heart disease Paternal Grandmother   . Colon cancer Paternal Grandmother   . Stroke Paternal Grandfather   . Arthritis Sister     Physical Exam: Blood pressure 130/78, pulse 60, temperature 98.3 F (36.8 C), temperature source Oral, resp. rate 16, height 5\' 9"  (1.753 m), weight 260 lb (117.935 kg)., Body mass index is 38.38 kg/(m^2). General: WF. Appears in no acute distress. HEENT: Normocephalic, atraumatic. Conjunctiva pink, sclera non-icteric. Pupils 2 mm constricting to 1 mm, round, regular, and equally reactive to light and accomodation. EOMI. Internal auditory canal clear. TMs with good cone of light and without pathology. Nasal mucosa pink. Nares are without discharge. No sinus tenderness. Oral mucosa pink.  Pharynx without exudate.   Neck: Supple. Trachea midline. No thyromegaly. Full ROM. No lymphadenopathy.No Carotid Bruits. Lungs: Clear to auscultation bilaterally without wheezes, rales, or rhonchi. Breathing is of normal effort and unlabored. Cardiovascular: RRR with S1 S2. No murmurs, rubs, or gallops. Distal pulses 2+ symmetrically. No carotid or abdominal bruits. Breast: She defers,  Abdomen: Soft, non-tender, non-distended with normoactive bowel sounds. No hepatosplenomegaly or masses. No rebound/guarding. No CVA tenderness. No hernias.  Genitourinary: She defers. Musculoskeletal: Full range of motion and 5/5 strength throughout. Skin: Warm and moist without erythema, ecchymosis, wounds, or rash. Neuro: A+Ox3. CN II-XII grossly intact. Moves all extremities spontaneously. Full sensation throughout. Normal gait. Psych:  Responds to questions appropriately with a normal affect.   Assessment/Plan:  59 y.o. y/o female here for CPE  1. Visit for preventive health examination  A. Screening Labs: She  recently had a full panel of fasting labs. All labs normal except lipid panel. See #5 below. Also her vitamin D level has decreased significantly since last check---discussed increasing dose of otc supplement  B. Pap: She reports she had this performed 06/26/2014--normal  C. Screening Mammogram: Last mammogram 11/21/2013. Agreeable for me to schedule now. - MM Digital Screening; Future  D. DEXA/BMD:  She is interested in going ahead with a screening bone density scan. - DG Bone Density; Future  E. Colorectal Cancer Screening: Last colonoscopy was performed 12/30/2013. Repeat 10 years.  F. Immunizations:  Influenza: She will receive this through her work--she is [employer] Employee Tetanus:  She is a [employer] employee andthat this has been done  in the last 10 years but we do not have records. She is to obtain records from occupational health. Pneumococcal: She has no indication to require this until age 67 Zostavax: She is going to find out the cost of this with her insurance plan and then decide whether to pursue this.   2. Osteoarthritis of right knee, unspecified osteoarthritis type - Ambulatory referral to Orthopedic Surgery  3. Right knee pain - Ambulatory referral to Orthopedic Surgery  4. Depression This is controlled with current dose of Celexa  5. Hyperlipidemia In remote past, she required statin treatment. Over time she was able to decrease and ultimately stop statin completely secondary to diet changes and weight loss. Today her LDL is now up to 165. She reports that she knows her diet has been poor recently. She knows she has also gained weight. She cannot do exercise secondary to her knee pain. Discussed whether to go ahead and start statin to control lipids while she works on diet. She opts to work on diet and weight loss and have follow-up in 3 months to recheck lipid panel and weight.  6. Weight Management She reports that she knows her diet has been poor  recently. She knows she has also gained weight. She cannot do exercise secondary to her knee pain. She opts to work on diet and weight loss and have follow-up in 3 months to recheck lipid panel and weight.   Signed, [id number] [place], [place], [facility] 12/13/2014 5:01 PM

## 2015-03-13 MED FILL — valACYclovir HCL 1 GM TABS: 1 | 8 days supply | Qty: 30 | Fill #3

## 2015-03-20 ENCOUNTER — Other Ambulatory Visit: Payer: 59

## 2015-03-20 ENCOUNTER — Other Ambulatory Visit: Payer: Self-pay | Admitting: *Deleted

## 2015-03-20 DIAGNOSIS — Z1329 Encounter for screening for other suspected endocrine disorder: Secondary | ICD-10-CM

## 2015-03-20 DIAGNOSIS — E559 Vitamin D deficiency, unspecified: Secondary | ICD-10-CM | POA: Diagnosis not present

## 2015-03-20 DIAGNOSIS — Z0000 Encounter for general adult medical examination without abnormal findings: Secondary | ICD-10-CM

## 2015-03-20 DIAGNOSIS — E785 Hyperlipidemia, unspecified: Secondary | ICD-10-CM | POA: Diagnosis not present

## 2015-03-20 LAB — COMPLETE METABOLIC PANEL WITH GFR
ALT: 11 U/L (ref 6–29)
AST: 12 U/L (ref 10–35)
Albumin: 4.1 g/dL (ref 3.6–5.1)
Alkaline Phosphatase: 55 U/L (ref 33–130)
BUN: 21 mg/dL (ref 7–25)
CHLORIDE: 103 mmol/L (ref 98–110)
CO2: 26 mmol/L (ref 20–31)
CREATININE: 0.82 mg/dL (ref 0.50–0.99)
Calcium: 9.5 mg/dL (ref 8.6–10.4)
GFR, Est African American: 89 mL/min (ref >=60)
GFR, Est Non African American: 77 mL/min (ref >=60)
GLUCOSE: 79 mg/dL (ref 70–99)
POTASSIUM: 4.6 mmol/L (ref 3.5–5.3)
SODIUM: 139 mmol/L (ref 135–146)
Total Bilirubin: 0.5 mg/dL (ref 0.2–1.2)
Total Protein: 6.3 g/dL (ref 6.1–8.1)

## 2015-03-20 LAB — CBC WITH DIFFERENTIAL/PLATELET
BASOS ABS: 0 10*3/uL (ref 0.0–0.1)
BASOS PCT: 1 % (ref 0–1)
EOS PCT: 1 % (ref 0–5)
Eosinophils Absolute: 0 10*3/uL (ref 0.0–0.7)
HEMATOCRIT: 37 % (ref 36.0–46.0)
Hemoglobin: 12.6 g/dL (ref 12.0–15.0)
LYMPHS PCT: 28 % (ref 12–46)
Lymphs Abs: 1.1 10*3/uL (ref 0.7–4.0)
MCH: 29.9 pg (ref 26.0–34.0)
MCHC: 34.1 g/dL (ref 30.0–36.0)
MCV: 87.9 fL (ref 78.0–100.0)
MONO ABS: 0.5 10*3/uL (ref 0.1–1.0)
MPV: 8.9 fL (ref 8.6–12.4)
Monocytes Relative: 11 % (ref 3–12)
NEUTROS ABS: 2.4 10*3/uL (ref 1.7–7.7)
Neutrophils Relative %: 59 % (ref 43–77)
Platelets: 192 10*3/uL (ref 150–400)
RBC: 4.21 MIL/uL (ref 3.87–5.11)
RDW: 13.5 % (ref 11.5–15.5)
WBC: 4.1 10*3/uL (ref 4.0–10.5)

## 2015-03-20 LAB — LIPID PANEL
CHOL/HDL RATIO: 5 ratio (ref <=5.0)
CHOLESTEROL: 295 mg/dL — AB (ref 125–200)
HDL: 59 mg/dL (ref >=46)
LDL CALC: 201 mg/dL — AB (ref <130)
TRIGLYCERIDES: 177 mg/dL — AB (ref <150)
VLDL: 35 mg/dL — AB (ref <30)

## 2015-03-20 LAB — TSH: TSH: 1.53 u[IU]/mL (ref 0.350–4.500)

## 2015-03-21 LAB — VITAMIN D 25 HYDROXY (VIT D DEFICIENCY, FRACTURES): Vit D, 25-Hydroxy: 38 ng/mL (ref 30–100)

## 2015-03-22 ENCOUNTER — Ambulatory Visit (INDEPENDENT_AMBULATORY_CARE_PROVIDER_SITE_OTHER): Payer: 59 | Admitting: Physician Assistant

## 2015-03-22 ENCOUNTER — Encounter: Payer: Self-pay | Admitting: Physician Assistant

## 2015-03-22 VITALS — BP 130/72 | HR 84 | Temp 97.9°F | Resp 16 | Ht 69.0 in | Wt 268.0 lb

## 2015-03-22 DIAGNOSIS — E785 Hyperlipidemia, unspecified: Secondary | ICD-10-CM

## 2015-03-22 MED ORDER — ATORVASTATIN CALCIUM 40 MG PO TABS: 40.0000 mg | ORAL_TABLET | Freq: Every day | ORAL | Status: DC

## 2015-03-22 MED FILL — ATORVASTATIN 40 MG TABLET: 40 | 90 days supply | Qty: 90 | Fill #0

## 2015-03-22 NOTE — Progress Notes (Signed)
[name] MRN: [id number], DOB: May 10, 1955, 60 y.o. Date of Encounter: 03/22/2015,   Chief Complaint: F/U Hyperlipidemia  HPI: 60 y.o. y/o female  here for f/u of her hyperlipidemia.    THE FOLLOWING IS COPIED FROM HER CPE 12/13/2014:  Her last routine follow-up visit scheduled with me was back 06/15/2013.  Today we reviewed her medical issues that have occurred since then.  09/17/13 - 09/20/13----she had admission with constipation and colitis which was felt to be inflammatory versus infectious. 10/03/13 she had office visit with GI, Monique Gonzalez 10/09/13 she went to the ER with GI complaints. 10/09/13 she went back to the ER and was admitted. 10/12/13 she underwent colonoscopy--- revealed stricture in the colon near the splenic flexure. Biopsy negative for cancer. Was felt the stricture was likely secondary to chronic irritation and ischemia. 10/14/13 she underwent surgery--laparoscopic colon resection/cholectomy 12/30/13 she had follow-up colonoscopy. She is to repeat in 10 years.  08/07/14 she had right wrist fracture. 08/08/14 ORIF distal radial fracture  She reports that she is no longer needing any Linzess or over-the-counter stool softeners.  She reports that she is having a lot of problems with her right knee. Is that even to go to the grocery store after walking on it that amount of time and feels that she is limping. She had office visit with Monique Gonzalez 05/23/14 at which time he did injection to the right knee. Reports that she had minimal relief after that injection. Reviewed that she had x-ray 11/18/2011 that showed osteoarthritis. There are osteophytes and spurring present. Today she says that she "has to do something about this knee "and is wanting referral to orthopedics. Is wanting to see an orthopedic that is with an [employer] insurance plan.  She says that she also has been having pain across both sides of her low back and she points to about L5 level as area of pain.  Says that this pain radiates down both sides just down to the upper thighs. Never has any pain, numbness, or tingling further down the legs. States that this pain is worst when she first wakes up in the morning. Has not had x-ray on this area and many years but says that this is not as bad and she will just monitor for now.  Also she feels that this pain is worsened recently secondary to recent weight gain and also having to hold to her mother who is living with her. She wants to monitor and see if this pain will improve with weight loss.   States that her mood is stable and well controlled with current dose of Celexa. This medication continues to cause no adverse effects.   03/22/2015: Since her last visit she did see Monique Gonzalez orthopedic. Has had "Chicken Cartilage/Chicken Juice" injection in her right knee and says that has helped some. As well she did have her mammogram and DEXA scan November 11. At her visit 12/13/14-- her cholesterol was high but she wanted to treat with diet and weight loss rather than restarting Lipitor at that visit. Therefore we planned diet changes and recheck 3 months. She recently had fasting labs and is here to review and discuss these today. No other complaints or concerns today.   Review of Systems: Consitutional: No fever, chills, fatigue, night sweats, lymphadenopathy. No significant/unexplained weight changes. Eyes: No visual changes, eye redness, or discharge. ENT/Mouth: No ear pain, sore throat, nasal drainage, or sinus pain. Cardiovascular: No chest pressure,heaviness, tightness or squeezing, even  with exertion. No increased shortness of breath or dyspnea on exertion.No palpitations, edema, orthopnea, PND. Respiratory: No cough, hemoptysis, SOB, or wheezing. Gastrointestinal: No anorexia, dysphagia, reflux, pain, nausea, vomiting, hematemesis, diarrhea, constipation, BRBPR, or melena. Breast: No mass, nodules, bulging, or retraction. No skin changes or  inflammation. No nipple discharge. No lymphadenopathy. Genitourinary: No dysuria, hematuria, incontinence, vaginal discharge, pruritis, burning, abnormal bleeding, or pain. Musculoskeletal: See HPI.  Skin: No rash, pruritis, or concerning lesions. Neurological: No headache, dizziness, syncope, seizures, tremors, memory loss, coordination problems, or paresthesias. Psychological: No hallucinations, SI/HI. Endocrine: No polydipsia, polyphagia, polyuria, or known diabetes.No increased fatigue. No palpitations/rapid heart rate. No significant/unexplained weight change. All other systems were reviewed and are otherwise negative.  Past Medical History  Diagnosis Date  . Seasonal allergies   . Osteoarthritis   . Depression   . Hyperlipidemia   . Recurrent cold sores   . Syncope and collapse 11/30/2012    Normal EEG.  Vaso vagal syncope  . Colitis 09/2012    infectious vs inflammatory.   . Allergy   . Colon stricture ([facility])   . Osteopenia      Past Surgical History  Procedure Laterality Date  . Tonsillectomy    . Colonoscopy N/A 10/12/2013    Procedure: COLONOSCOPY;  Surgeon: Monique Bears, Monique Gonzalez;  Location: [facility] ENDOSCOPY;  Service: Endoscopy;  Laterality: N/A;  . Colon resection N/A 10/14/2013    Procedure: LAPAROSCOPIC MOBILIZATION OF SPLENIC FLEXURE, LAPAROSCOPIC EXTENDED LEFT COLECTOMY;  Surgeon: Monique Curry, Monique Gonzalez;  Location: [facility];  Service: General;  Laterality: N/A;  . Open reduction internal fixation (orif) distal radial fracture Right 08/08/2014    Procedure: OPEN REDUCTION INTERNAL FIXATION (ORIF) DISTAL RADIAL FRACTURE;  Surgeon: Monique Kaufman, Monique Gonzalez;  Location: [facility];  Service: Orthopedics;  Laterality: Right;  DVR [place], Monique Gonzalez available sometime around 1430-1500    Home Meds:  Outpatient Prescriptions Prior to Visit  Medication Sig Dispense Refill  . acetaminophen (TYLENOL) 650 MG CR tablet Take 1,300 mg by mouth at bedtime.    Marland Kitchen acyclovir cream (ZOVIRAX) 5 % Apply 1 application  topically every 3 (three) hours. (Patient taking differently: Apply 1 application topically every 3 (three) hours as needed (outbreak). ) 15 g 3  . Cholecalciferol (VITAMIN D) 1000 UNITS capsule Take 2,000 Units by mouth daily.     . citalopram (CELEXA) 20 MG tablet Take 1 tablet (20 mg total) by mouth daily. 90 tablet 3  . Cyanocobalamin (VITAMIN B 12 PO) Take 1,000 mcg by mouth daily.    . fish oil-omega-3 fatty acids 1000 MG capsule Take 2 g by mouth daily.    . fluticasone (FLONASE) 50 MCG/ACT nasal spray Place 2 sprays into both nostrils daily. 16 g 6  . Glucos-MSM-C-Mn-Ginger-Willow (GLUCOSAMINE MSM COMPLEX) TABS Take 1 tablet by mouth daily.     Marland Kitchen loratadine (CLARITIN) 10 MG tablet Take 10 mg by mouth daily.      Marland Kitchen Lysine 500 MG TABS Take 500 mg by mouth daily.     . meloxicam (MOBIC) 15 MG tablet Take 1 tablet (15 mg total) by mouth daily. 90 tablet 3  . Multiple Vitamins-Minerals (MULTIVITAMIN,TX-MINERALS) tablet Take 1 tablet by mouth daily.      . ondansetron (ZOFRAN) 8 MG tablet Take 1 tablet (8 mg total) by mouth 3 (three) times daily. (Patient taking differently: Take 8 mg by mouth every 8 (eight) hours as needed for nausea or vomiting. ) 60 tablet 0  . valACYclovir (VALTREX) 1000 MG tablet  TAKE 2 TABLETS BY MOUTH EVERY 12 HOURS FOR 2 DOSES AS NEEDED FOR FLARE 30 tablet 11  . vitamin E 400 UNIT capsule Take 400 Units by mouth daily.       No facility-administered medications prior to visit.    Allergies:  Allergies  Allergen Reactions  . Penicillins Rash  . Sulfa Antibiotics Rash    Social History   Social History  . Marital Status: Divorced    Spouse Name: N/A  . Number of Children: 2  . Years of Education: college   Occupational History  . 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 Smoking status: Former Smoker    Quit date: 10/27/1993  . Smokeless tobacco: Never Used  . Alcohol Use: Yes     Comment: one drink per week  . Drug Use: No  . Sexual  Activity: Not on file   Other Topics Concern  . Not on file   Social History Narrative    Family History  Problem Relation Age of Onset  . Sudden death Neg Hx   . Hypertension Neg Hx   . Hyperlipidemia Neg Hx   . Heart attack Neg Hx   . Diabetes Neg Hx   . Rectal cancer Neg Hx   . Stomach cancer Neg Hx   . Esophageal cancer Neg Hx   . Stroke Mother   . Arthritis Mother   . Cancer Father   . Arthritis Sister   . Cancer Maternal Grandmother   . Stroke Maternal Grandfather   . Cancer Maternal Grandfather   . Colon cancer Maternal Grandfather   . Cancer Paternal Grandmother   . Heart disease Paternal Grandmother   . Colon cancer Paternal Grandmother   . Stroke Paternal Grandfather   . Arthritis Sister     Physical Exam: Blood pressure 130/72, pulse 84, temperature 97.9 F (36.6 C), temperature source Oral, resp. rate 16, height 5\' 9"  (1.753 m), weight 268 lb (121.564 kg)., Body mass index is 39.56 kg/(m^2). General: WF. Appears in no acute distress. Neck: Supple. Trachea midline. No thyromegaly. Full ROM. No lymphadenopathy.No Carotid Bruits. Lungs: Clear to auscultation bilaterally without wheezes, rales, or rhonchi. Breathing is of normal effort and unlabored. Cardiovascular: RRR with S1 S2. No murmurs, rubs, or gallops. Distal pulses 2+ symmetrically. No carotid or abdominal bruits.  Abdomen: Soft, non-tender, non-distended with normoactive bowel sounds. No hepatosplenomegaly or masses. No rebound/guarding. No CVA tenderness. No hernias.  Musculoskeletal: Full range of motion and 5/5 strength throughout. Skin: Warm and moist without erythema, ecchymosis, wounds, or rash. Neuro: A+Ox3. CN II-XII grossly intact. Moves all extremities spontaneously. Full sensation throughout. Normal gait. Psych:  Responds to questions appropriately with a normal affect.   Assessment/Plan:  60 y.o. y/o female here for   1. Hyperlipidemia LDL is now up to 200. She was on Lipitor in the  past and tolerated this well with no adverse effects. Therefore will use Lipitor again. Will start Lipitor 40 mg. She will return for follow-up visit in 3 months. At that time we'll recheck FLP and LFT but also will follow-up her weight and her diet. (Can do no exercise currently secondary to knee pain) - atorvastatin (LIPITOR) 40 MG tablet; Take 1 tablet (40 mg total) by mouth daily.  Dispense: 90 tablet; Refill: 0  THE FOLLOWING IS COPIED FROM HER CPE 12/13/2014:  Visit for preventive health examination  A. Screening Labs: She recently had a full panel of fasting labs. All labs  normal except lipid panel. See #5 below. Also her vitamin D level has decreased significantly since last check---discussed increasing dose of otc supplement  B. Pap: She reports she had this performed 06/26/2014--normal  C. Screening Mammogram: Last mammogram 11/21/2013. Agreeable for me to schedule now. - MM Digital Screening; Future ADDENDUM--ADDED 03/22/15--- patient states that she had this 01/19/2015 and was normal  D. DEXA/BMD:  She is interested in going ahead with a screening bone density scan. - DG Bone Density; Future ADDENDUM ADDED 03/22/15--- patient states that she had this 01/19/2015 and was normal.  E. Colorectal Cancer Screening: Last colonoscopy was performed 12/30/2013. Repeat 10 years.  F. Immunizations:  Influenza: She will receive this through her work--she is [employer] Employee Tetanus:  She is a [employer] employee andthat this has been done in the last 10 years but we do not have records. She is to obtain records from occupational health. Pneumococcal: She has no indication to require this until age 89 Zostavax: She is going to find out the cost of this with her insurance plan and then decide whether to pursue this.   Osteoarthritis of right knee, unspecified osteoarthritis type At visit 12/13/14 order per referral to orthopedic. At visit 03/22/15 she reports that she has been seen  Monique Gonzalez and has had injection to the right knee which has helped some.  Right knee pain At visit 12/13/14 order per referral to orthopedic. At visit 03/22/15 she reports that she has been seen Monique Gonzalez and has had injection to the right knee which has helped some.  Depression This is controlled with current dose of Celexa  Signed, [address] [place], [place], [facility] 03/22/2015 5:01 PM

## 2015-03-26 ENCOUNTER — Other Ambulatory Visit: Payer: Self-pay | Admitting: Physician Assistant

## 2015-03-26 MED FILL — MELOXICAM 15 MG TABLET: 15 | 90 days supply | Qty: 90 | Fill #0

## 2015-03-26 NOTE — Telephone Encounter (Signed)
Script refilled

## 2015-04-04 MED FILL — CLINDAMYCIN HCL 300 MG CAPS: 300 | 11 days supply | Qty: 21 | Fill #0

## 2015-04-05 ENCOUNTER — Ambulatory Visit (INDEPENDENT_AMBULATORY_CARE_PROVIDER_SITE_OTHER): Payer: 59 | Admitting: Physician Assistant

## 2015-04-05 ENCOUNTER — Encounter: Payer: Self-pay | Admitting: Physician Assistant

## 2015-04-05 VITALS — BP 124/70 | HR 72 | Temp 98.3°F | Resp 18

## 2015-04-05 DIAGNOSIS — M545 Low back pain, unspecified: Secondary | ICD-10-CM

## 2015-04-05 MED ORDER — CYCLOBENZAPRINE HCL 10 MG PO TABS: 10.0000 mg | ORAL_TABLET | Freq: Three times a day (TID) | ORAL | Status: DC | PRN

## 2015-04-05 MED ORDER — PREDNISONE 20 MG PO TABS: ORAL_TABLET | ORAL | Status: DC

## 2015-04-05 MED FILL — predniSONE 20 MG TABS: 20 | 6 days supply | Qty: 12 | Fill #0

## 2015-04-05 MED FILL — CYCLOBENZAPRINE 10 MG TAB: 10 | 20 days supply | Qty: 60 | Fill #0

## 2015-04-05 NOTE — Progress Notes (Addendum)
[name] MRN: [id number], DOB: 02-12-1956, 60 y.o. Date of Encounter: 04/05/2015, 3:38 PM    Chief Complaint: Low Back Pain   HPI: 60 y.o. year old female presents for evaluation of low back pain.  She points to bilateral low back at approximate L4-L5 level as area of pain. She says that the pain radiates down toward the buttocks laterally. Says that the discomfort is slightly worse on the left than the right.  Says that it hurts if she lies down. Hurts if she is standing. Feels much better when sitting.  Has no pain, no numbness or tingling, no weakness down either leg. No cauda equina. No incontinence of bowel or bladder.  Says that a couple of months ago she had to help lift her mother and that seemed to aggravate her low back some and it has bothered her off and on since then. However, it worsened over the last week. She recently traveled to [place] to visit family. Says that walking through the airport seemed to really aggravate it.  During that trip, her activity was limited secondary to this low back pain.  She takes Mobic 15 mg daily for her knee pain/back pain. Takes Tylenol arthritis every night. Today has also taken Tylenol 500 mg and this helped to "take the edge off".     Home Meds:   Outpatient Prescriptions Prior to Visit  Medication Sig Dispense Refill  . acetaminophen (TYLENOL) 650 MG CR tablet Take 1,300 mg by mouth at bedtime.    Marland Kitchen acyclovir cream (ZOVIRAX) 5 % Apply 1 application topically every 3 (three) hours. (Patient taking differently: Apply 1 application topically every 3 (three) hours as needed (outbreak). ) 15 g 3  . atorvastatin (LIPITOR) 40 MG tablet Take 1 tablet (40 mg total) by mouth daily. 90 tablet 0  . Cholecalciferol (VITAMIN D) 1000 UNITS capsule Take 2,000 Units by mouth daily.     . citalopram (CELEXA) 20 MG tablet Take 1 tablet (20 mg total) by mouth daily. 90 tablet 3  . Cyanocobalamin (VITAMIN B 12 PO) Take 1,000  mcg by mouth daily.    . fish oil-omega-3 fatty acids 1000 MG capsule Take 2 g by mouth daily.    . fluticasone (FLONASE) 50 MCG/ACT nasal spray Place 2 sprays into both nostrils daily. 16 g 6  . Glucos-MSM-C-Mn-Ginger-Willow (GLUCOSAMINE MSM COMPLEX) TABS Take 1 tablet by mouth daily.     Marland Kitchen loratadine (CLARITIN) 10 MG tablet Take 10 mg by mouth daily.      Marland Kitchen Lysine 500 MG TABS Take 500 mg by mouth daily.     . meloxicam (MOBIC) 15 MG tablet TAKE 1 TABLET (15 MG TOTAL) BY MOUTH DAILY. 90 tablet 3  . Multiple Vitamins-Minerals (MULTIVITAMIN,TX-MINERALS) tablet Take 1 tablet by mouth daily.      . ondansetron (ZOFRAN) 8 MG tablet Take 1 tablet (8 mg total) by mouth 3 (three) times daily. (Patient taking differently: Take 8 mg by mouth every 8 (eight) hours as needed for nausea or vomiting. ) 60 tablet 0  . valACYclovir (VALTREX) 1000 MG tablet TAKE 2 TABLETS BY MOUTH EVERY 12 HOURS FOR 2 DOSES AS NEEDED FOR FLARE 30 tablet 11  . vitamin E 400 UNIT capsule Take 400 Units by mouth daily.       No facility-administered medications prior to visit.    Allergies:  Allergies  Allergen Reactions  . Penicillins Rash  . Sulfa Antibiotics Rash      Review  of Systems: See HPI for pertinent ROS. All other ROS negative.    Physical Exam: Blood pressure 124/70, pulse 72, temperature 98.3 F (36.8 C), resp. rate 18., There is no weight on file to calculate BMI. General:  WF. Appears in no acute distress. Neck: Supple. No thyromegaly. No lymphadenopathy. Lungs: Clear bilaterally to auscultation without wheezes, rales, or rhonchi. Breathing is unlabored. Heart: Regular rhythm. No murmurs, rubs, or gallops. Msk:  Strength and tone normal for age. Pain with palpation at L4-L5 level bilaterally. Extremities/Skin: Warm and dry. Neuro: Alert and oriented X 3. Moves all extremities spontaneously. Gait is normal. CNII-XII grossly in tact. Psych:  Responds to questions appropriately with a normal affect.      ASSESSMENT AND PLAN:  60 y.o. year old female with  1. Bilateral low back pain without sciatica Says that she has used tramadol in the past and this causes adverse effects and does not want this. Also does not want any narcotic pain medication.  Continue Mobic 15 mg daily. Continue Tylenol. Will add muscle relaxer--Flexeril 10 mg daily at bedtime---she says she has used this in the past and is aware that it can cause drowsiness and will only use it at bedtime. Will add prednisone taper to see if this will get rid of any inflammation that may be causing current flare.  Discussed obtaining x-ray. Wants to hold off on x-ray right now and see if symptoms will resolve with this treatment. If symptoms do not resolve with this treatment then will obtain x-ray and imaging.  - cyclobenzaprine (FLEXERIL) 10 MG tablet; Take 1 tablet (10 mg total) by mouth 3 (three) times daily as needed for muscle spasms.  Dispense: 60 tablet; Refill: 0 - predniSONE (DELTASONE) 20 MG tablet; Take 3 daily for 2 days, then 2 daily for 2 days, then 1 daily for 2 days.  Dispense: 12 tablet; Refill: 0   Signed, [address] [place], [place], [facility] 04/05/2015 3:38 PM

## 2015-04-24 ENCOUNTER — Encounter: Payer: Self-pay | Admitting: Physician Assistant

## 2015-04-24 MED ORDER — CITALOPRAM HYDROBROMIDE 20 MG PO TABS: 20.0000 mg | ORAL_TABLET | Freq: Every day | ORAL | Status: DC

## 2015-04-24 MED FILL — CITALOPRAM HBR 20 MG TABLET: 20 | 90 days supply | Qty: 90 | Fill #0

## 2015-04-26 ENCOUNTER — Ambulatory Visit (HOSPITAL_BASED_OUTPATIENT_CLINIC_OR_DEPARTMENT_OTHER)
Admission: RE | Admit: 2015-04-26 | Discharge: 2015-04-26 | Disposition: A | Discharge status: Home / Self Care | Payer: 59 | Source: Ambulatory Visit | Attending: Physician Assistant | Admitting: Physician Assistant

## 2015-04-26 ENCOUNTER — Ambulatory Visit (INDEPENDENT_AMBULATORY_CARE_PROVIDER_SITE_OTHER): Payer: 59 | Admitting: Physician Assistant

## 2015-04-26 ENCOUNTER — Encounter: Payer: Self-pay | Admitting: Physician Assistant

## 2015-04-26 VITALS — BP 130/70 | HR 72 | Temp 97.5°F | Resp 18

## 2015-04-26 DIAGNOSIS — M4316 Spondylolisthesis, lumbar region: Secondary | ICD-10-CM | POA: Diagnosis not present

## 2015-04-26 DIAGNOSIS — M5441 Lumbago with sciatica, right side: Secondary | ICD-10-CM

## 2015-04-26 DIAGNOSIS — M4696 Unspecified inflammatory spondylopathy, lumbar region: Secondary | ICD-10-CM | POA: Diagnosis not present

## 2015-04-26 DIAGNOSIS — M5136 Other intervertebral disc degeneration, lumbar region: Secondary | ICD-10-CM | POA: Diagnosis not present

## 2015-04-26 NOTE — Progress Notes (Signed)
Patient ID: CARLEISHA NEWBERY MRN: [id number], DOB: [birth year], 60 y.o. Date of Encounter: 04/26/2015, 2:16 PM    Chief Complaint: Low Back Pain   HPI: 60 y.o. year old female presents for evaluation of low back pain.   04/05/2015 OV:  She points to bilateral low back at approximate L4-L5 level as area of pain. She says that the pain radiates down toward the buttocks laterally. Says that the discomfort is slightly worse on the left than the right.  Says that it hurts if she lies down. Hurts if she is standing. Feels much better when sitting.  Has no pain, no numbness or tingling, no weakness down either leg. No cauda equina. No incontinence of bowel or bladder.  Says that a couple of months ago she had to help lift her mother and that seemed to aggravate her low back some and it has bothered her off and on since then. However, it worsened over the last week. She recently traveled to [place] to visit family. Says that walking through the airport seemed to really aggravate it.  During that trip, her activity was limited secondary to this low back pain.  She takes Mobic 15 mg daily for her knee pain/back pain. Takes Tylenol arthritis every night. Today has also taken Tylenol 500 mg and this helped to "take the edge off".  AT THAT OV: Continue Mobic 15 mg daily. Continue Tylenol. Will add muscle relaxer--Flexeril 10 mg daily at bedtime---she says she has used this in the past and is aware that it can cause drowsiness and will only use it at bedtime. Will add prednisone taper to see if this will get rid of any inflammation that may be causing current flare.  Discussed obtaining x-ray. Wants to hold off on x-ray right now and see if symptoms will resolve with this treatment. If symptoms do not resolve with this treatment then will obtain x-ray and imaging.  - cyclobenzaprine (FLEXERIL) 10 MG tablet; Take 1 tablet (10 mg total) by mouth 3 (three) times daily as needed for muscle  spasms.  Dispense: 60 tablet; Refill: 0 - predniSONE (DELTASONE) 20 MG tablet; Take 3 daily for 2 days, then 2 daily for 2 days, then 1 daily for 2 days.  Dispense: 12 tablet; Refill: 0    04/26/2015: Patient states that now she is having back pain--regardless of what position she is in. Says that at night she is woken with back pain. Says that now "when sitting, it feels like she is sitting on nails -- on both sides. " Says that now she has an area that feels numb on the distal anterior right leg and top of the right foot. Says that she is having no weakness no decrease in strength of the leg or foot. Says it is just that one area of a numb sensation.  Home Meds:   Outpatient Prescriptions Prior to Visit  Medication Sig Dispense Refill  . acetaminophen (TYLENOL) 650 MG CR tablet Take 1,300 mg by mouth at bedtime.    Marland Kitchen acyclovir cream (ZOVIRAX) 5 % Apply 1 application topically every 3 (three) hours. (Patient taking differently: Apply 1 application topically every 3 (three) hours as needed (outbreak). ) 15 g 3  . atorvastatin (LIPITOR) 40 MG tablet Take 1 tablet (40 mg total) by mouth daily. 90 tablet 0  . Cholecalciferol (VITAMIN D) 1000 UNITS capsule Take 2,000 Units by mouth daily.     . citalopram (CELEXA) 20 MG tablet Take 1 tablet (20 mg  total) by mouth daily. 90 tablet 3  . Cyanocobalamin (VITAMIN B 12 PO) Take 1,000 mcg by mouth daily.    . cyclobenzaprine (FLEXERIL) 10 MG tablet Take 1 tablet (10 mg total) by mouth 3 (three) times daily as needed for muscle spasms. 60 tablet 0  . fish oil-omega-3 fatty acids 1000 MG capsule Take 2 g by mouth daily.    . fluticasone (FLONASE) 50 MCG/ACT nasal spray Place 2 sprays into both nostrils daily. 16 g 6  . Glucos-MSM-C-Mn-Ginger-Willow (GLUCOSAMINE MSM COMPLEX) TABS Take 1 tablet by mouth daily.     Marland Kitchen loratadine (CLARITIN) 10 MG tablet Take 10 mg by mouth daily.      Marland Kitchen Lysine 500 MG TABS Take 500 mg by mouth daily.     . meloxicam (MOBIC)  15 MG tablet TAKE 1 TABLET (15 MG TOTAL) BY MOUTH DAILY. 90 tablet 3  . Multiple Vitamins-Minerals (MULTIVITAMIN,TX-MINERALS) tablet Take 1 tablet by mouth daily.      . ondansetron (ZOFRAN) 8 MG tablet Take 1 tablet (8 mg total) by mouth 3 (three) times daily. (Patient taking differently: Take 8 mg by mouth every 8 (eight) hours as needed for nausea or vomiting. ) 60 tablet 0  . predniSONE (DELTASONE) 20 MG tablet Take 3 daily for 2 days, then 2 daily for 2 days, then 1 daily for 2 days. 12 tablet 0  . valACYclovir (VALTREX) 1000 MG tablet TAKE 2 TABLETS BY MOUTH EVERY 12 HOURS FOR 2 DOSES AS NEEDED FOR FLARE 30 tablet 11  . vitamin E 400 UNIT capsule Take 400 Units by mouth daily.       No facility-administered medications prior to visit.    Allergies:  Allergies  Allergen Reactions  . Penicillins Rash  . Sulfa Antibiotics Rash      Review of Systems: See HPI for pertinent ROS. All other ROS negative.    Physical Exam: Blood pressure 130/70, pulse 72, temperature 97.5 F (36.4 C), temperature source Oral, resp. rate 18., There is no weight on file to calculate BMI. General:  WF. Appears in no acute distress. Neck: Supple. No thyromegaly. No lymphadenopathy. Lungs: Clear bilaterally to auscultation without wheezes, rales, or rhonchi. Breathing is unlabored. Heart: Regular rhythm. No murmurs, rubs, or gallops. Msk:  Strength and tone normal for age. Pain with palpation at L4-L5 level bilaterally. Extremities/Skin: Warm and dry. Neuro: Alert and oriented X 3. Moves all extremities spontaneously. Gait is normal. CNII-XII grossly in tact. Psych:  Responds to questions appropriately with a normal affect.     ASSESSMENT AND PLAN:  60 y.o. year old female with  1. Bilateral low back pain without sciatica Says that she has used tramadol in the past and this causes adverse effects and does not want this. Also does not want any narcotic pain medication.  Continue Mobic 15 mg  daily. Continue Tylenol. Continue muscle relaxer--Flexeril 10 mg daily at bedtime---she says she has used this in the past and is aware that it can cause drowsiness and will only use it at bedtime. Was given a prednisone taper 04/05/15. Will avoid repeat prednisone now.  Will obtain x-ray.  Will follow-up with patient once I get these x-ray results. Once I obtain these results, then will determine next step in management/treatment plan.  [address] [place], [place], [facility] 04/26/2015 2:16 PM

## 2015-04-30 ENCOUNTER — Other Ambulatory Visit: Payer: Self-pay | Admitting: Family Medicine

## 2015-04-30 DIAGNOSIS — M51369 Other intervertebral disc degeneration, lumbar region without mention of lumbar back pain or lower extremity pain: Secondary | ICD-10-CM

## 2015-04-30 DIAGNOSIS — M5136 Other intervertebral disc degeneration, lumbar region: Secondary | ICD-10-CM

## 2015-04-30 DIAGNOSIS — M5416 Radiculopathy, lumbar region: Secondary | ICD-10-CM

## 2015-04-30 DIAGNOSIS — M4310 Spondylolisthesis, site unspecified: Secondary | ICD-10-CM

## 2015-05-05 ENCOUNTER — Ambulatory Visit (HOSPITAL_BASED_OUTPATIENT_CLINIC_OR_DEPARTMENT_OTHER): Payer: 59

## 2015-06-14 ENCOUNTER — Encounter: Payer: Self-pay | Admitting: Physician Assistant

## 2015-06-18 ENCOUNTER — Other Ambulatory Visit: Payer: Self-pay | Admitting: Family Medicine

## 2015-06-18 DIAGNOSIS — M545 Low back pain, unspecified: Secondary | ICD-10-CM

## 2015-06-18 MED ORDER — CYCLOBENZAPRINE HCL 10 MG PO TABS: 10.0000 mg | ORAL_TABLET | Freq: Three times a day (TID) | ORAL | Status: DC | PRN

## 2015-06-18 MED FILL — CYCLOBENZAPRINE 10 MG TAB: 10 | 20 days supply | Qty: 60 | Fill #0

## 2015-06-26 ENCOUNTER — Other Ambulatory Visit: Payer: Self-pay | Admitting: Physician Assistant

## 2015-06-26 MED FILL — MELOXICAM 15 MG TABLET: 15 | 90 days supply | Qty: 90 | Fill #1

## 2015-06-26 MED FILL — ATORVASTATIN 40 MG TABLET: 40 | 90 days supply | Qty: 90 | Fill #0

## 2015-06-26 NOTE — Telephone Encounter (Signed)
Refill appropriate and filled per protocol. 

## 2015-07-24 MED FILL — CITALOPRAM HBR 20 MG TABLET: 20 | 90 days supply | Qty: 90 | Fill #1

## 2015-08-29 ENCOUNTER — Ambulatory Visit: Payer: 59 | Admitting: Physician Assistant

## 2015-09-05 DIAGNOSIS — M25561 Pain in right knee: Secondary | ICD-10-CM | POA: Diagnosis not present

## 2015-09-19 ENCOUNTER — Other Ambulatory Visit: Payer: Self-pay | Admitting: Family Medicine

## 2015-09-19 MED ORDER — VALACYCLOVIR HCL 1 G PO TABS: ORAL_TABLET | ORAL | Status: DC

## 2015-09-19 MED FILL — valACYclovir HCL 1 GM TABS: 1 | 10 days supply | Qty: 30 | Fill #0

## 2015-09-19 NOTE — Telephone Encounter (Signed)
Medication called/sent to requested pharmacy  

## 2015-09-20 DIAGNOSIS — M1711 Unilateral primary osteoarthritis, right knee: Secondary | ICD-10-CM | POA: Diagnosis not present

## 2015-10-02 MED FILL — ATORVASTATIN 40 MG TABLET: 40 | 90 days supply | Qty: 90 | Fill #1

## 2015-10-02 MED FILL — MELOXICAM 15 MG TABLET: 15 | 90 days supply | Qty: 90 | Fill #2

## 2015-10-25 MED FILL — CITALOPRAM HBR 20 MG TABLET: 20 | 90 days supply | Qty: 90 | Fill #2

## 2015-11-19 ENCOUNTER — Ambulatory Visit (INDEPENDENT_AMBULATORY_CARE_PROVIDER_SITE_OTHER): Payer: 59 | Admitting: *Deleted

## 2015-11-19 DIAGNOSIS — Z23 Encounter for immunization: Secondary | ICD-10-CM | POA: Diagnosis not present

## 2015-11-19 DIAGNOSIS — Z299 Encounter for prophylactic measures, unspecified: Secondary | ICD-10-CM

## 2015-11-19 NOTE — Progress Notes (Signed)
Patient seen for Shingles vaccination.   Tolerated SQ injection well.

## 2015-11-20 DIAGNOSIS — H40013 Open angle with borderline findings, low risk, bilateral: Secondary | ICD-10-CM | POA: Diagnosis not present

## 2015-11-20 DIAGNOSIS — H5203 Hypermetropia, bilateral: Secondary | ICD-10-CM | POA: Diagnosis not present

## 2015-11-20 DIAGNOSIS — H524 Presbyopia: Secondary | ICD-10-CM | POA: Diagnosis not present

## 2015-11-20 DIAGNOSIS — H52223 Regular astigmatism, bilateral: Secondary | ICD-10-CM | POA: Diagnosis not present

## 2015-12-17 MED FILL — AZITHROMYCIN 250 MG TABLET: 250 | 5 days supply | Qty: 6 | Fill #0

## 2015-12-17 MED FILL — BENZONATATE 200 MG CAPSULE: 200 | 10 days supply | Qty: 30 | Fill #0

## 2016-01-07 ENCOUNTER — Ambulatory Visit (INDEPENDENT_AMBULATORY_CARE_PROVIDER_SITE_OTHER): Payer: 59 | Admitting: Physician Assistant

## 2016-01-07 ENCOUNTER — Encounter: Payer: Self-pay | Admitting: Physician Assistant

## 2016-01-07 ENCOUNTER — Ambulatory Visit: Payer: Self-pay | Admitting: Physician Assistant

## 2016-01-07 VITALS — BP 132/72 | HR 70 | Temp 98.3°F | Resp 18 | Ht 70.0 in | Wt 278.0 lb

## 2016-01-07 DIAGNOSIS — B9689 Other specified bacterial agents as the cause of diseases classified elsewhere: Principal | ICD-10-CM

## 2016-01-07 DIAGNOSIS — J988 Other specified respiratory disorders: Secondary | ICD-10-CM

## 2016-01-07 MED ORDER — PREDNISONE 20 MG PO TABS: ORAL_TABLET | ORAL | 0 refills | Status: DC

## 2016-01-07 MED ORDER — LEVOFLOXACIN 750 MG PO TABS: 750.0000 mg | ORAL_TABLET | Freq: Every day | ORAL | 0 refills | Status: DC

## 2016-01-07 MED FILL — predniSONE 20 MG TABS: 20 | 6 days supply | Qty: 12 | Fill #0

## 2016-01-07 MED FILL — levoFLOXacin 750 MG TABS: 750 | 7 days supply | Qty: 7 | Fill #0

## 2016-01-07 NOTE — Progress Notes (Signed)
[name] MRN: [id number], DOB: 03-07-1956, 60 y.o. Date of Encounter: 01/07/2016, 11:42 AM    Chief Complaint:  Chief Complaint  Patient presents with  . Cough     HPI: 60 y.o. year old female presents with above.   Says that around 10 days ago she was treated with Z-Pak. Says that after that her symptoms did get better but have not completely resolved. Points across her forehead and says that that area feels like it is still congested with some pressure and feels like something is stopped up in there. Says that she continues with cough that will not go away. Also feels like there is drainage in her throat that she needs to try to clear. A lot of cough at night. She is taking Zyrtec daily. She has used Mining engineer and also some Mucinex cough. No fever or chills. No sore throat.     Home Meds:   Outpatient Medications Prior to Visit  Medication Sig Dispense Refill  . acetaminophen (TYLENOL) 650 MG CR tablet Take 1,300 mg by mouth at bedtime.    Marland Kitchen acyclovir cream (ZOVIRAX) 5 % Apply 1 application topically every 3 (three) hours. (Patient taking differently: Apply 1 application topically every 3 (three) hours as needed (outbreak). ) 15 g 3  . atorvastatin (LIPITOR) 40 MG tablet TAKE 1 TABLET BY MOUTH DAILY 90 tablet 3  . citalopram (CELEXA) 20 MG tablet Take 1 tablet (20 mg total) by mouth daily. 90 tablet 3  . cyclobenzaprine (FLEXERIL) 10 MG tablet Take 1 tablet (10 mg total) by mouth 3 (three) times daily as needed for muscle spasms. 60 tablet 5  . meloxicam (MOBIC) 15 MG tablet TAKE 1 TABLET (15 MG TOTAL) BY MOUTH DAILY. 90 tablet 3  . Cholecalciferol (VITAMIN D) 1000 UNITS capsule Take 2,000 Units by mouth daily.     . Cyanocobalamin (VITAMIN B 12 PO) Take 1,000 mcg by mouth daily.    . fish oil-omega-3 fatty acids 1000 MG capsule Take 2 g by mouth daily.    . fluticasone (FLONASE) 50 MCG/ACT nasal spray Place 2 sprays into both nostrils daily. (Patient not taking:  Reported on 01/07/2016) 16 g 6  . Glucos-MSM-C-Mn-Ginger-Willow (GLUCOSAMINE MSM COMPLEX) TABS Take 1 tablet by mouth daily.     Marland Kitchen loratadine (CLARITIN) 10 MG tablet Take 10 mg by mouth daily.      Marland Kitchen Lysine 500 MG TABS Take 500 mg by mouth daily.     . Multiple Vitamins-Minerals (MULTIVITAMIN,TX-MINERALS) tablet Take 1 tablet by mouth daily.      . ondansetron (ZOFRAN) 8 MG tablet Take 1 tablet (8 mg total) by mouth 3 (three) times daily. (Patient not taking: Reported on 01/07/2016) 60 tablet 0  . valACYclovir (VALTREX) 1000 MG tablet TAKE 2 TABLETS BY MOUTH EVERY 12 HOURS FOR 2 DOSES AS NEEDED FOR FLARE (Patient not taking: Reported on 01/07/2016) 30 tablet 11  . vitamin E 400 UNIT capsule Take 400 Units by mouth daily.      . predniSONE (DELTASONE) 20 MG tablet Take 3 daily for 2 days, then 2 daily for 2 days, then 1 daily for 2 days. (Patient not taking: Reported on 01/07/2016) 12 tablet 0   No facility-administered medications prior to visit.     Allergies:  Allergies  Allergen Reactions  . Penicillins Rash  . Sulfa Antibiotics Rash      Review of Systems: See HPI for pertinent ROS. All other ROS negative.    Physical  Exam: Blood pressure 132/72, pulse 70, temperature 98.3 F (36.8 C), temperature source Oral, resp. rate 18, height 5\' 10"  (1.778 m), weight 278 lb (126.1 kg), SpO2 98 %., Body mass index is 39.89 kg/m. General: WF  Appears in no acute distress. HEENT: Normocephalic, atraumatic, eyes without discharge, sclera non-icteric, nares are without discharge. Bilateral auditory canals clear, TM's are without perforation, pearly grey and translucent with reflective cone of light bilaterally. Oral cavity moist, posterior pharynx without exudate, erythema, peritonsillar abscess, or post nasal drip. Mild tenderness with percussion to frontal sinuses. No tenderness with percussion to maxillary sinuses bilaterally.  Neck: Supple. No thyromegaly. No lymphadenopathy. Lungs: Clear  bilaterally to auscultation without wheezes, rales, or rhonchi. Breathing is unlabored. Heart: Regular rhythm. No murmurs, rubs, or gallops. Msk:  Strength and tone normal for age. Extremities/Skin: Warm and dry.  Neuro: Alert and oriented X 3. Moves all extremities spontaneously. Gait is normal. CNII-XII grossly in tact. Psych:  Responds to questions appropriately with a normal affect.     ASSESSMENT AND PLAN:  60 y.o. year old female with  1. Bacterial respiratory infection She is to take the Levaquin and the prednisone taper as directed. Continue using Zyrtec daily. Follow-up if symptoms do not resolve after completion of the Levaquin and prednisone. - predniSONE (DELTASONE) 20 MG tablet; Take 3 daily for 2 days, then 2 daily for 2 days, then 1 daily for 2 days.  Dispense: 12 tablet; Refill: 0 - levofloxacin (LEVAQUIN) 750 MG tablet; Take 1 tablet (750 mg total) by mouth daily.  Dispense: 7 tablet; Refill: 0   Signed, [address] [place], [place], [facility] 01/07/2016 11:42 AM

## 2016-02-04 MED FILL — CITALOPRAM HBR 20 MG TABLET: 20 | 90 days supply | Qty: 90 | Fill #3

## 2016-02-04 MED FILL — MELOXICAM 15 MG TABLET: 15 | 90 days supply | Qty: 90 | Fill #3

## 2016-02-04 MED FILL — ATORVASTATIN 40 MG TABLET: 40 | 90 days supply | Qty: 90 | Fill #2

## 2016-02-07 ENCOUNTER — Ambulatory Visit: Payer: 59 | Admitting: Nurse Practitioner

## 2016-02-11 MED FILL — valACYclovir HCL 1 GM TABS: 1 | 10 days supply | Qty: 30 | Fill #1

## 2016-03-05 ENCOUNTER — Encounter (INDEPENDENT_AMBULATORY_CARE_PROVIDER_SITE_OTHER): Payer: 59 | Admitting: Family Medicine

## 2016-03-14 MED FILL — CYCLOBENZAPRINE 10 MG TAB: 10 | 20 days supply | Qty: 60 | Fill #1

## 2016-03-14 MED FILL — valACYclovir HCL 1 GM TABS: 1 | 10 days supply | Qty: 30 | Fill #2

## 2016-03-17 ENCOUNTER — Encounter (INDEPENDENT_AMBULATORY_CARE_PROVIDER_SITE_OTHER): Payer: Self-pay | Admitting: Family Medicine

## 2016-03-24 ENCOUNTER — Ambulatory Visit (INDEPENDENT_AMBULATORY_CARE_PROVIDER_SITE_OTHER): Payer: 59 | Admitting: Family Medicine

## 2016-03-25 ENCOUNTER — Encounter (INDEPENDENT_AMBULATORY_CARE_PROVIDER_SITE_OTHER): Payer: Self-pay | Admitting: Family Medicine

## 2016-03-25 ENCOUNTER — Ambulatory Visit (INDEPENDENT_AMBULATORY_CARE_PROVIDER_SITE_OTHER): Payer: 59 | Admitting: Family Medicine

## 2016-03-25 VITALS — BP 132/83 | HR 72 | Temp 98.6°F | Resp 12 | Ht 72.0 in | Wt 277.0 lb

## 2016-03-25 DIAGNOSIS — R5383 Other fatigue: Secondary | ICD-10-CM

## 2016-03-25 DIAGNOSIS — E669 Obesity, unspecified: Secondary | ICD-10-CM

## 2016-03-25 DIAGNOSIS — Z9189 Other specified personal risk factors, not elsewhere classified: Secondary | ICD-10-CM

## 2016-03-25 DIAGNOSIS — E66812 Obesity, class 2: Secondary | ICD-10-CM

## 2016-03-25 DIAGNOSIS — Z1389 Encounter for screening for other disorder: Secondary | ICD-10-CM | POA: Diagnosis not present

## 2016-03-25 DIAGNOSIS — Z6837 Body mass index (BMI) 37.0-37.9, adult: Secondary | ICD-10-CM

## 2016-03-25 DIAGNOSIS — Z1331 Encounter for screening for depression: Secondary | ICD-10-CM

## 2016-03-25 DIAGNOSIS — Z0289 Encounter for other administrative examinations: Secondary | ICD-10-CM

## 2016-03-25 DIAGNOSIS — E785 Hyperlipidemia, unspecified: Secondary | ICD-10-CM | POA: Diagnosis not present

## 2016-03-25 DIAGNOSIS — R0602 Shortness of breath: Secondary | ICD-10-CM

## 2016-03-25 NOTE — Progress Notes (Signed)
Office: [phone]  /  Fax: [phone]   HPI:   Chief Complaint: OBESITY  Monique Gonzalez (MR# [employer]:[id number]) is a 61 y.o. female who presents on 03/25/2016 for obesity evaluation and treatment. Current BMI is Body mass index is 37.57 kg/m.Marland Kitchen Monique Gonzalez has struggled with obesity for years and has been unsuccessful in either losing weight or maintaining long term weight loss. Gissel attended our information session and states she is currently in the action stage of change and ready to dedicate time achieving and maintaining a healthier weight.  Monique Gonzalez states her family eats meals together she thinks her family will eat healthier with  her her desired weight is 200 she started gaining weight in 2000 her heaviest weight ever was 301 lbs. she has significant food cravings issues  she frequently makes poor food choices she has problems with excessive hunger  she frequently eats larger portions than normal  she has binge eating behaviors she struggles with emotional eating    Fatigue Monique Gonzalez feels her energy is lower than it should be. This has worsened with weight gain and has not worsened recently. Monique Gonzalez admits to daytime somnolence and  admits to waking up still tired. Monique Gonzalez is at risk for obstructive sleep apnea. Patent has a history of symptoms of daytime fatigue. Monique Gonzalez generally gets 6 or 7 hours of sleep per night, and states they generally have poor sleep. Snoring is present. Apneic episodes are not present. Epworth Sleepiness Score is 10.  Dyspnea on exertion Monique Gonzalez notes increasing shortness of breath with exercising and seems to be worsening over time with weight gain. She notes getting out of breath sooner with activity than she used to. This has not gotten worse recently. Monique Gonzalez denies orthopnea.  Hyperlipidemia Monique Gonzalez has hyperlipidemia and has been trying to improve her cholesterol levels with intensive lifestyle modification including a low saturated fat diet, exercise and weight loss. She denies any  chest pain, claudication or myalgias.  At risk for cardiovascular disease Monique Gonzalez is at a higher than average risk for cardiovascular disease due to obesity. She currently denies any chest pain.  Depression Screen Monique Gonzalez (modified PHQ-9) score was  Depression screen PHQ 2/9 03/25/2016  Decreased Interest 3  Down, Depressed, Hopeless 3  PHQ - 2 Score 6  Altered sleeping 3  Tired, decreased energy 3  Change in appetite 3  Feeling bad or failure about yourself  3  Trouble concentrating 3  Moving slowly or fidgety/restless 3  Suicidal thoughts 0  PHQ-9 Score 24    ALLERGIES: Allergies  Allergen Reactions  . Penicillins Rash  . Sulfa Antibiotics Rash    MEDICATIONS: Current Outpatient Prescriptions on File Prior to Visit  Medication Sig Dispense Refill  . acetaminophen (TYLENOL) 650 MG CR tablet Take 1,300 mg by mouth at bedtime.    Marland Kitchen acyclovir cream (ZOVIRAX) 5 % Apply 1 application topically every 3 (three) hours. (Monique Gonzalez taking differently: Apply 1 application topically every 3 (three) hours as needed (outbreak). ) 15 g 3  . atorvastatin (LIPITOR) 40 MG tablet TAKE 1 TABLET BY MOUTH DAILY 90 tablet 3  . citalopram (CELEXA) 20 MG tablet Take 1 tablet (20 mg total) by mouth daily. 90 tablet 3  . cyclobenzaprine (FLEXERIL) 10 MG tablet Take 1 tablet (10 mg total) by mouth 3 (three) times daily as needed for muscle spasms. 60 tablet 5  . fluticasone (FLONASE) 50 MCG/ACT nasal spray Place 2 sprays into both nostrils daily. 16 g 6  . loratadine (CLARITIN)  10 MG tablet Take 10 mg by mouth daily.      . meloxicam (MOBIC) 15 MG tablet TAKE 1 TABLET (15 MG TOTAL) BY MOUTH DAILY. 90 tablet 3  . Multiple Vitamins-Minerals (MULTIVITAMIN,TX-MINERALS) tablet Take 1 tablet by mouth daily.      . ondansetron (ZOFRAN) 8 MG tablet Take 1 tablet (8 mg total) by mouth 3 (three) times daily. 60 tablet 0  . valACYclovir (VALTREX) 1000 MG tablet TAKE 2 TABLETS BY MOUTH EVERY 12 HOURS FOR 2  DOSES AS NEEDED FOR FLARE 30 tablet 11   No current facility-administered medications on file prior to visit.     PAST MEDICAL HISTORY: Past Medical History:  Diagnosis Date  . Allergy   . Chronic pain    back and knee  . Colitis 09/2012   infectious vs inflammatory.   . Colon stricture   . Depression   . Hyperlipidemia   . Obesity   . Osteoarthritis   . Osteopenia   . Recurrent cold sores   . Seasonal allergies   . Syncope and collapse 11/30/2012   Normal EEG.  Vaso vagal syncope    PAST SURGICAL HISTORY: Past Surgical History:  Procedure Laterality Date  . COLON RESECTION N/A 10/14/2013   Procedure: LAPAROSCOPIC MOBILIZATION OF SPLENIC FLEXURE, LAPAROSCOPIC EXTENDED LEFT COLECTOMY;  Surgeon: [name], MD;  Location: [facility];  Service: General;  Laterality: N/A;  . COLONOSCOPY N/A 10/12/2013   Procedure: COLONOSCOPY;  Surgeon: [name], MD;  Location: [facility] ENDOSCOPY;  Service: Endoscopy;  Laterality: N/A;  . DILATION AND CURETTAGE, DIAGNOSTIC / THERAPEUTIC  1987  . OPEN REDUCTION INTERNAL FIXATION (ORIF) DISTAL RADIAL FRACTURE Right 08/08/2014   Procedure: OPEN REDUCTION INTERNAL FIXATION (ORIF) DISTAL RADIAL FRACTURE;  Surgeon: Roseanne Kaufman, MD;  Location: [facility];  Service: Orthopedics;  Laterality: Right;  DVR Crosslock, MD available sometime around 1430-1500  . TONSILLECTOMY      SOCIAL HISTORY: Social History  Substance Use Topics  . Smoking status: Former Smoker    Quit date: 10/27/1993  . Smokeless tobacco: Never Used  . Alcohol use Yes     Comment: one drink per week    FAMILY HISTORY: Family History  Problem Relation Age of Onset  . Stroke Mother   . Arthritis Mother   . Thyroid disease Mother   . Cancer Father   . Arthritis Sister   . Thyroid disease Sister   . Cancer Maternal Grandmother   . Stroke Maternal Grandfather   . Cancer Maternal Grandfather   . Colon cancer Maternal Grandfather   . Cancer Paternal Grandmother   . Heart disease Paternal  Grandmother   . Colon cancer Paternal Grandmother   . Stroke Paternal Grandfather   . Arthritis Sister   . Obesity Sister   . Sudden death Neg Hx   . Hypertension Neg Hx   . Hyperlipidemia Neg Hx   . Heart attack Neg Hx   . Diabetes Neg Hx   . Rectal cancer Neg Hx   . Stomach cancer Neg Hx   . Esophageal cancer Neg Hx     ROS: Review of Systems  Constitutional: Positive for malaise/fatigue.  HENT: Positive for congestion.   Eyes:       Wear Glasses or Contacts  Cardiovascular: Negative for chest pain.       Shortness of Breath with Activity  Musculoskeletal: Positive for back pain, joint pain and myalgias.       Muscle Stiffness  Skin:  Dryness  Neurological: Positive for weakness.  Psychiatric/Behavioral: Positive for depression. The Monique Gonzalez has insomnia.        Stress    PHYSICAL EXAM: Blood pressure 132/83, pulse 72, temperature 98.6 F (37 C), temperature source Oral, resp. rate 12, height 6' (1.829 m), weight 277 lb (125.6 kg), SpO2 97 %. Body mass index is 37.57 kg/m. Physical Exam  Constitutional: She is oriented to person, place, and time. She appears well-developed and well-nourished.  Cardiovascular: Normal rate.   Pulmonary/Chest: Effort normal.  Musculoskeletal: Normal range of motion. She exhibits edema (Trace edema bilateral lower extremities).  Neurological: She is oriented to person, place, and time.  Skin: Skin is warm and dry.  Psychiatric: She has a normal Gonzalez and affect. Her behavior is normal.  Vitals reviewed.   RECENT LABS AND TESTS: BMET    Component Value Date/Time   NA 139 03/20/2015 1420   K 4.6 03/20/2015 1420   CL 103 03/20/2015 1420   CO2 26 03/20/2015 1420   GLUCOSE 79 03/20/2015 1420   BUN 21 03/20/2015 1420   CREATININE 0.82 03/20/2015 1420   CALCIUM 9.5 03/20/2015 1420   GFRNONAA 77 03/20/2015 1420   GFRAA 89 03/20/2015 1420   Lab Results  Component Value Date   HGBA1C (H) 09/11/2009    5.7 (NOTE)                                                                        According to the ADA Clinical Practice Recommendations for 2011, when HbA1c is used as a screening test:   >=6.5%   Diagnostic of Diabetes Mellitus           (if abnormal result  is confirmed)  5.7-6.4%   Increased risk of developing Diabetes Mellitus  References:Diagnosis and Classification of Diabetes Mellitus,Diabetes [id number] 1):S62-S69 and Standards of Medical Care in         Diabetes - 2011,Diabetes [id number]  (Suppl 1):S11-S61.   No results found for: INSULIN CBC    Component Value Date/Time   WBC 4.1 03/20/2015 1420   RBC 4.21 03/20/2015 1420   HGB 12.6 03/20/2015 1420   HCT 37.0 03/20/2015 1420   PLT 192 03/20/2015 1420   MCV 87.9 03/20/2015 1420   MCH 29.9 03/20/2015 1420   MCHC 34.1 03/20/2015 1420   RDW 13.5 03/20/2015 1420   LYMPHSABS 1.1 03/20/2015 1420   MONOABS 0.5 03/20/2015 1420   EOSABS 0.0 03/20/2015 1420   BASOSABS 0.0 03/20/2015 1420   Iron/TIBC/Ferritin/ %Sat No results found for: IRON, TIBC, FERRITIN, IRONPCTSAT Lipid Panel     Component Value Date/Time   CHOL 295 (H) 03/20/2015 1420   TRIG 177 (H) 03/20/2015 1420   HDL 59 03/20/2015 1420   CHOLHDL 5.0 03/20/2015 1420   VLDL 35 (H) 03/20/2015 1420   LDLCALC 201 (H) 03/20/2015 1420   Hepatic Function Panel     Component Value Date/Time   PROT 6.3 03/20/2015 1420   ALBUMIN 4.1 03/20/2015 1420   AST 12 03/20/2015 1420   ALT 11 03/20/2015 1420   ALKPHOS 55 03/20/2015 1420   BILITOT 0.5 03/20/2015 1420      Component Value Date/Time   TSH 1.530 03/20/2015 1420   TSH 2.427 12/04/2014 0908  TSH 1.701 11/17/2013 1227    ECG  shows NSR with a rate of 72 BPM INDIRECT CALORIMETER done today shows a VO2 of 271 and a REE of 1888.    ASSESSMENT AND PLAN: Other fatigue - Plan: EKG 12-Lead, Vitamin B12, CBC With Differential, Comprehensive metabolic panel, Folate, Hemoglobin A1c, TSH, T4, free, T3, Insulin, random, VITAMIN D 25  Hydroxy (Vit-D Deficiency, Fractures)  Shortness of breath  Hyperlipidemia, unspecified hyperlipidemia type - Plan: Lipid Panel With LDL/HDL Ratio  At risk for heart disease  Depression screening  Class 2 obesity without serious comorbidity with body mass index (BMI) of 37.0 to 37.9 in adult, unspecified obesity type  PLAN:  Fatigue Monique Gonzalez was informed that her fatigue may be related to obesity, depression or many other causes. Labs will be ordered, and in the meanwhile Monique Gonzalez has agreed to work on diet, exercise and weight loss to help with fatigue. Proper sleep hygiene was discussed including the need for 7-8 hours of quality sleep each night. A sleep study was not ordered based on symptoms and Epworth score.  Dyspnea on exertion Monique Gonzalez's shortness of breath appears to be obesity related and exercise induced. She has agreed to work on weight loss and gradually increase exercise to treat her exercise induced shortness of breath. If Monique Gonzalez follows our instructions and loses weight without improvement of her shortness of breath, we will plan to refer to pulmonology. We will monitor this condition regularly. Monique Gonzalez agrees to this plan.  Hyperlipidemia Monique Gonzalez is currently on Lipitor, she denies chest pain, reports myalgia. She was informed of the American Heart Association Guidelines emphasize intensive lifestyle modifications as the first line treatment for hyperlipidemia. We discussed many lifestyle modifications today in depth, and Monique Gonzalez will continue to work on decreasing saturated fats such as fatty red meat, butter and many fried foods. She will also increase vegetables and lean protein in her diet and continue to work on exercise and her weight loss efforts.  Cardiovascular risk counselling Monique Gonzalez was given extended (at least 15 minutes) coronary artery disease prevention counseling today. She is 61 y.o. female and has risk factors for heart disease including obesity. We discussed intensive lifestyle  modifications today with an emphasis on specific weight loss instructions and strategies. Pt was also informed of the importance of increasing exercise and decreasing saturated fats to help prevent heart disease.  Depression Screen Monique Gonzalez had a positive depression screening. Depression is commonly associated with obesity and often results in emotional eating behaviors. We will monitor this closely and work on CBT to help improve the non-hunger eating patterns. Referral to Psychology may be required if no improvement is seen as she continues in our clinic.  Obesity Monique Gonzalez is currently in the action stage of change and her goal is to continue with weight loss efforts She has agreed to follow the Category 3 plan Chyan has been instructed to work up to a goal of 150 minutes of combined cardio and strengthening exercise per week for weight loss and overall health benefits. We discussed the following Behavioral Modification Stratagies today: increasing lean protein intake, decreasing simple carbohydrates , increasing vegetables, decrease eating out and dealing with family or coworker sabotage  Demi has agreed to follow up with our clinic in 2 weeks. She was informed of the importance of frequent follow up visits to maximize her success with intensive lifestyle modifications for her multiple health conditions. She was informed we would discuss her lab results at her next visit unless there is a  critical issue that needs to be addressed sooner. Kaede agreed to keep her next visit at the agreed upon time to discuss these results.  I, Doreene Nest, am acting as scribe for Dennard Nip, MD  I have reviewed the above documentation for accuracy and completeness, and I agree with the above. -Dennard Nip, MD

## 2016-03-26 LAB — CBC WITH DIFFERENTIAL
BASOS: 0 %
Basophils Absolute: 0 10*3/uL (ref 0.0–0.2)
EOS (ABSOLUTE): 0 10*3/uL (ref 0.0–0.4)
Eos: 0 %
HEMOGLOBIN: 12.5 g/dL (ref 11.1–15.9)
Hematocrit: 37.2 % (ref 34.0–46.6)
IMMATURE GRANS (ABS): 0 10*3/uL (ref 0.0–0.1)
Immature Granulocytes: 0 %
LYMPHS ABS: 1.2 10*3/uL (ref 0.7–3.1)
LYMPHS: 23 %
MCH: 29.6 pg (ref 26.6–33.0)
MCHC: 33.6 g/dL (ref 31.5–35.7)
MCV: 88 fL (ref 79–97)
MONOCYTES: 9 %
Monocytes Absolute: 0.5 10*3/uL (ref 0.1–0.9)
NEUTROS ABS: 3.5 10*3/uL (ref 1.4–7.0)
Neutrophils: 68 %
RBC: 4.22 x10E6/uL (ref 3.77–5.28)
RDW: 14 % (ref 12.3–15.4)
WBC: 5.2 10*3/uL (ref 3.4–10.8)

## 2016-03-26 LAB — COMPREHENSIVE METABOLIC PANEL
A/G RATIO: 1.8 (ref 1.2–2.2)
ALBUMIN: 4.2 g/dL (ref 3.6–4.8)
ALT: 14 IU/L (ref 0–32)
AST: 16 IU/L (ref 0–40)
Alkaline Phosphatase: 72 IU/L (ref 39–117)
BILIRUBIN TOTAL: 0.4 mg/dL (ref 0.0–1.2)
BUN / CREAT RATIO: 20 (ref 12–28)
BUN: 16 mg/dL (ref 8–27)
CHLORIDE: 101 mmol/L (ref 96–106)
CO2: 26 mmol/L (ref 18–29)
Calcium: 9.3 mg/dL (ref 8.7–10.3)
Creatinine, Ser: 0.79 mg/dL (ref 0.57–1.00)
GFR calc non Af Amer: 80 mL/min/{1.73_m2} (ref >59)
GFR, EST AFRICAN AMERICAN: 93 mL/min/{1.73_m2} (ref >59)
Globulin, Total: 2.3 g/dL (ref 1.5–4.5)
Glucose: 94 mg/dL (ref 65–99)
POTASSIUM: 4.6 mmol/L (ref 3.5–5.2)
Sodium: 142 mmol/L (ref 134–144)
TOTAL PROTEIN: 6.5 g/dL (ref 6.0–8.5)

## 2016-03-26 LAB — LIPID PANEL WITH LDL/HDL RATIO
Cholesterol, Total: 172 mg/dL (ref 100–199)
HDL: 59 mg/dL (ref >39)
LDL Calculated: 84 mg/dL (ref 0–99)
LDl/HDL Ratio: 1.4 ratio units (ref 0.0–3.2)
Triglycerides: 145 mg/dL (ref 0–149)
VLDL CHOLESTEROL CAL: 29 mg/dL (ref 5–40)

## 2016-03-26 LAB — HEMOGLOBIN A1C
Est. average glucose Bld gHb Est-mCnc: 105 mg/dL
Hgb A1c MFr Bld: 5.3 % (ref 4.8–5.6)

## 2016-03-26 LAB — VITAMIN D 25 HYDROXY (VIT D DEFICIENCY, FRACTURES): Vit D, 25-Hydroxy: 29.9 ng/mL — ABNORMAL LOW (ref 30.0–100.0)

## 2016-03-26 LAB — FOLATE: Folate: 17.9 ng/mL (ref >3.0)

## 2016-03-26 LAB — TSH: TSH: 1.42 u[IU]/mL (ref 0.450–4.500)

## 2016-03-26 LAB — VITAMIN B12: Vitamin B-12: 635 pg/mL (ref 232–1245)

## 2016-03-26 LAB — T4, FREE: Free T4: 1.02 ng/dL (ref 0.82–1.77)

## 2016-03-26 LAB — T3: T3, Total: 104 ng/dL (ref 71–180)

## 2016-03-26 LAB — INSULIN, RANDOM: INSULIN: 5.5 u[IU]/mL (ref 2.6–24.9)

## 2016-04-08 ENCOUNTER — Encounter (INDEPENDENT_AMBULATORY_CARE_PROVIDER_SITE_OTHER): Payer: Self-pay | Admitting: Family Medicine

## 2016-04-08 ENCOUNTER — Ambulatory Visit (INDEPENDENT_AMBULATORY_CARE_PROVIDER_SITE_OTHER): Payer: 59 | Admitting: Family Medicine

## 2016-04-08 VITALS — BP 147/83 | HR 102 | Temp 98.5°F | Ht 72.0 in | Wt 274.0 lb

## 2016-04-08 DIAGNOSIS — E785 Hyperlipidemia, unspecified: Secondary | ICD-10-CM

## 2016-04-08 DIAGNOSIS — Z9189 Other specified personal risk factors, not elsewhere classified: Secondary | ICD-10-CM

## 2016-04-08 DIAGNOSIS — E669 Obesity, unspecified: Secondary | ICD-10-CM | POA: Diagnosis not present

## 2016-04-08 DIAGNOSIS — Z6837 Body mass index (BMI) 37.0-37.9, adult: Secondary | ICD-10-CM

## 2016-04-08 DIAGNOSIS — E559 Vitamin D deficiency, unspecified: Secondary | ICD-10-CM | POA: Diagnosis not present

## 2016-04-08 HISTORY — DX: Vitamin D deficiency, unspecified: E55.9

## 2016-04-08 MED ORDER — VITAMIN D (ERGOCALCIFEROL) 1.25 MG (50000 UNIT) PO CAPS: 50000.0000 [IU] | ORAL_CAPSULE | ORAL | 0 refills | Status: DC

## 2016-04-08 MED FILL — VIT D2 1.25 MG (50,000 UNIT: 1.25 MG | 28 days supply | Qty: 4 | Fill #0

## 2016-04-09 NOTE — Progress Notes (Signed)
Office: [phone]  /  Fax: [phone]   HPI:   Chief Complaint: OBESITY Monique Gonzalez is here to discuss her progress with her obesity treatment plan. She is following her eating plan approximately 100 % of the time and states she is exercising 0 minutes 0 times per week. Monique Gonzalez did well with category 3 plan, she liked the structure and notes hunger was controlled well. Monique Gonzalez is currently struggling with feeling that dinner is too much food.  Her weight is 274 lb (124.3 kg) today and has had a weight loss of 3 pounds over a period of 2 weeks since her last visit. She has lost 3 lbs since starting treatment with [name].  Vitamin D deficiency Monique Gonzalez has a diagnosis of vitamin D deficiency, her vitamin D is low at 29.9 on January 16th.  She is not currently taking vitamin D, she admits fatigue and denies nausea, vomiting or muscle weakness.  Hyperlipidemia Monique Gonzalez has hyperlipidemia and is currently on Lipitor 40 mg. Her LDL is greatly improved and she has been trying to improve her cholesterol levels with intensive lifestyle modification including a low saturated fat diet, exercise and weight loss. She has a positive family history f hyperlipidemia and coronary artery disease. She denies any chest pain, claudication or myalgias.  At risk for cardiovascular disease Monique Gonzalez is at a higher than average risk for cardiovascular disease due to obesity. She currently denies any chest pain.  Wt Readings from Last 500 Encounters:  04/08/16 274 lb (124.3 kg)  03/25/16 277 lb (125.6 kg)  01/07/16 278 lb (126.1 kg)  03/22/15 268 lb (121.6 kg)  12/13/14 260 lb (117.9 kg)  08/08/14 240 lb (108.9 kg)  08/07/14 240 lb (108.9 kg)  12/30/13 231 lb (104.8 kg)  12/23/13 231 lb 9.6 oz (105.1 kg)  11/17/13 228 lb (103.4 kg)  11/01/13 223 lb (101.2 kg)  10/27/13 220 lb (99.8 kg)  10/10/13 226 lb 6.6 oz (102.7 kg)  10/09/13 230 lb (104.3 kg)  10/03/13 230 lb (104.3 kg)  09/29/13 233 lb (105.7 kg)  09/17/13 241 lb 6.4 oz  (109.5 kg)  06/15/13 228 lb (103.4 kg)  04/05/13 225 lb (102.1 kg)  11/30/12 222 lb (100.7 kg)  11/22/12 216 lb (98 kg)  11/18/11 209 lb (94.8 kg)     ALLERGIES: Allergies  Allergen Reactions  . Penicillins Rash  . Sulfa Antibiotics Rash    MEDICATIONS: Current Outpatient Prescriptions on File Prior to Visit  Medication Sig Dispense Refill  . acetaminophen (TYLENOL) 650 MG CR tablet Take 1,300 mg by mouth at bedtime.    Marland Kitchen acyclovir cream (ZOVIRAX) 5 % Apply 1 application topically every 3 (three) hours. (Patient taking differently: Apply 1 application topically every 3 (three) hours as needed (outbreak). ) 15 g 3  . atorvastatin (LIPITOR) 40 MG tablet TAKE 1 TABLET BY MOUTH DAILY 90 tablet 3  . citalopram (CELEXA) 20 MG tablet Take 1 tablet (20 mg total) by mouth daily. 90 tablet 3  . cyclobenzaprine (FLEXERIL) 10 MG tablet Take 1 tablet (10 mg total) by mouth 3 (three) times daily as needed for muscle spasms. 60 tablet 5  . fluticasone (FLONASE) 50 MCG/ACT nasal spray Place 2 sprays into both nostrils daily. 16 g 6  . loratadine (CLARITIN) 10 MG tablet Take 10 mg by mouth daily.      . meloxicam (MOBIC) 15 MG tablet TAKE 1 TABLET (15 MG TOTAL) BY MOUTH DAILY. 90 tablet 3  . Multiple Vitamins-Minerals (MULTIVITAMIN,TX-MINERALS) tablet Take 1  tablet by mouth daily.      . ondansetron (ZOFRAN) 8 MG tablet Take 1 tablet (8 mg total) by mouth 3 (three) times daily. 60 tablet 0  . valACYclovir (VALTREX) 1000 MG tablet TAKE 2 TABLETS BY MOUTH EVERY 12 HOURS FOR 2 DOSES AS NEEDED FOR FLARE 30 tablet 11   No current facility-administered medications on file prior to visit.     PAST MEDICAL HISTORY: Past Medical History:  Diagnosis Date  . Allergy   . Chronic pain    back and knee  . Colitis 09/2012   infectious vs inflammatory.   . Colon stricture   . Depression   . Hyperlipidemia   . Obesity   . Osteoarthritis   . Osteopenia   . Recurrent cold sores   . Seasonal allergies     . Syncope and collapse 11/30/2012   Normal EEG.  Vaso vagal syncope    PAST SURGICAL HISTORY: Past Surgical History:  Procedure Laterality Date  . COLON RESECTION N/A 10/14/2013   Procedure: LAPAROSCOPIC MOBILIZATION OF SPLENIC FLEXURE, LAPAROSCOPIC EXTENDED LEFT COLECTOMY;  Surgeon: [name], MD;  Location: [facility];  Service: General;  Laterality: N/A;  . COLONOSCOPY N/A 10/12/2013   Procedure: COLONOSCOPY;  Surgeon: [name], MD;  Location: [facility];  Service: Endoscopy;  Laterality: N/A;  . DILATION AND CURETTAGE, DIAGNOSTIC / THERAPEUTIC  1987  . OPEN REDUCTION INTERNAL FIXATION (ORIF) DISTAL RADIAL FRACTURE Right 08/08/2014   Procedure: OPEN REDUCTION INTERNAL FIXATION (ORIF) DISTAL RADIAL FRACTURE;  Surgeon: Roseanne Kaufman, MD;  Location: [facility];  Service: Orthopedics;  Laterality: Right;  DVR Crosslock, MD available sometime around 1430-1500  . TONSILLECTOMY      SOCIAL HISTORY: Social History  Substance Use Topics  . Smoking status: Former Smoker    Quit date: 10/27/1993  . Smokeless tobacco: Never Used  . Alcohol use Yes     Comment: one drink per week    FAMILY HISTORY: Family History  Problem Relation Age of Onset  . Stroke Mother   . Arthritis Mother   . Thyroid disease Mother   . Cancer Father   . Arthritis Sister   . Thyroid disease Sister   . Cancer Maternal Grandmother   . Stroke Maternal Grandfather   . Cancer Maternal Grandfather   . Colon cancer Maternal Grandfather   . Cancer Paternal Grandmother   . Heart disease Paternal Grandmother   . Colon cancer Paternal Grandmother   . Stroke Paternal Grandfather   . Arthritis Sister   . Obesity Sister   . Sudden death Neg Hx   . Hypertension Neg Hx   . Hyperlipidemia Neg Hx   . Heart attack Neg Hx   . Diabetes Neg Hx   . Rectal cancer Neg Hx   . Stomach cancer Neg Hx   . Esophageal cancer Neg Hx     ROS: Review of Systems  Constitutional: Positive for malaise/fatigue and weight loss.   Cardiovascular: Negative for chest pain and claudication.  Gastrointestinal: Negative for nausea and vomiting.  Musculoskeletal: Negative for myalgias.       Negative muscle weakness    PHYSICAL EXAM: Blood pressure (!) 147/83, pulse (!) 102, temperature 98.5 F (36.9 C), temperature source Oral, height 6' (1.829 m), weight 274 lb (124.3 kg), SpO2 98 %. Body mass index is 37.16 kg/m. Physical Exam  Constitutional: She is oriented to person, place, and time. She appears well-developed and well-nourished.  Cardiovascular: Normal rate.   Pulmonary/Chest: Effort normal.  Musculoskeletal: Normal range of motion. She exhibits edema (trace edema bilateral lower extremeties).  Neurological: She is oriented to person, place, and time.  Skin: Skin is warm and dry.  Psychiatric: She has a normal mood and affect. Her behavior is normal.  Vitals reviewed.   RECENT LABS AND TESTS: BMET    Component Value Date/Time   NA 142 03/25/2016 1341   K 4.6 03/25/2016 1341   CL 101 03/25/2016 1341   CO2 26 03/25/2016 1341   GLUCOSE 94 03/25/2016 1341   GLUCOSE 79 03/20/2015 1420   BUN 16 03/25/2016 1341   CREATININE 0.79 03/25/2016 1341   CREATININE 0.82 03/20/2015 1420   CALCIUM 9.3 03/25/2016 1341   GFRNONAA 80 03/25/2016 1341   GFRNONAA 77 03/20/2015 1420   GFRAA 93 03/25/2016 1341   GFRAA 89 03/20/2015 1420   Lab Results  Component Value Date   HGBA1C 5.3 03/25/2016   HGBA1C (H) 09/11/2009    5.7 (NOTE)                                                                       According to the ADA Clinical Practice Recommendations for 2011, when HbA1c is used as a screening test:   >=6.5%   Diagnostic of Diabetes Mellitus           (if abnormal result  is confirmed)  5.7-6.4%   Increased risk of developing Diabetes Mellitus  References:Diagnosis and Classification of Diabetes Mellitus,Diabetes [id number] 1):S62-S69 and Standards of Medical Care in         Diabetes - 2011,Diabetes  [id number]  (Suppl 1):S11-S61.   Lab Results  Component Value Date   INSULIN 5.5 03/25/2016   CBC    Component Value Date/Time   WBC 5.2 03/25/2016 1341   WBC 4.1 03/20/2015 1420   RBC 4.22 03/25/2016 1341   RBC 4.21 03/20/2015 1420   HGB 12.6 03/20/2015 1420   HCT 37.2 03/25/2016 1341   PLT 192 03/20/2015 1420   MCV 88 03/25/2016 1341   MCH 29.6 03/25/2016 1341   MCH 29.9 03/20/2015 1420   MCHC 33.6 03/25/2016 1341   MCHC 34.1 03/20/2015 1420   RDW 14.0 03/25/2016 1341   LYMPHSABS 1.2 03/25/2016 1341   MONOABS 0.5 03/20/2015 1420   EOSABS 0.0 03/25/2016 1341   BASOSABS 0.0 03/25/2016 1341   Iron/TIBC/Ferritin/ %Sat No results found for: IRON, TIBC, FERRITIN, IRONPCTSAT Lipid Panel     Component Value Date/Time   CHOL 172 03/25/2016 1341   TRIG 145 03/25/2016 1341   HDL 59 03/25/2016 1341   CHOLHDL 5.0 03/20/2015 1420   VLDL 35 (H) 03/20/2015 1420   LDLCALC 84 03/25/2016 1341   Hepatic Function Panel     Component Value Date/Time   PROT 6.5 03/25/2016 1341   ALBUMIN 4.2 03/25/2016 1341   AST 16 03/25/2016 1341   ALT 14 03/25/2016 1341   ALKPHOS 72 03/25/2016 1341   BILITOT 0.4 03/25/2016 1341      Component Value Date/Time   TSH 1.420 03/25/2016 1341   TSH 1.530 03/20/2015 1420   TSH 2.427 12/04/2014 0908    ASSESSMENT AND PLAN: Vitamin D deficiency - Plan: Vitamin D, Ergocalciferol, (DRISDOL) 50000 units CAPS capsule  Hyperlipidemia, unspecified hyperlipidemia type  At risk for heart disease  Class 2 obesity without serious comorbidity with body mass index (BMI) of 37.0 to 37.9 in adult, unspecified obesity type  PLAN:  Vitamin D Deficiency Monique Gonzalez was informed that low vitamin D levels contributes to fatigue and are associated with obesity, breast, and colon cancer. She agrees to start to take prescription Vit D @50 ,000 IU every week #4 with no refills and will re-check labs in 3 months and will follow up for routine testing of vitamin D, at least  2-3 times per year. She was informed of the risk of over-replacement of vitamin D and agrees to not increase her dose unless he discusses this with [name] first.  Hyperlipidemia Monique Gonzalez was informed of the American Heart Association Guidelines emphasizing intensive lifestyle modifications as the first line treatment for hyperlipidemia. We discussed many lifestyle modifications today in depth, and Monique Gonzalez will continue to work on decreasing saturated fats such as fatty red meat, butter and many fried foods. She will continue Lipitor and also increase vegetables and lean protein in her diet and continue to work on exercise and weight loss efforts.  Cardiovascular risk counselling Monique Gonzalez was given extended (at least 15 minutes) coronary artery disease prevention counseling today. She is 61 y.o. female and has risk factors for heart disease including obesity. We discussed intensive lifestyle modifications today with an emphasis on specific weight loss instructions and strategies. Pt was also informed of the importance of increasing exercise and decreasing saturated fats to help prevent heart disease.  Obesity Monique Gonzalez is currently in the action stage of change. As such, her goal is to continue with weight loss efforts She has agreed to keep a food journal with 350-550 calories and 35 grams of protein daily and follow the Category 3 plan Monique Gonzalez has been instructed to work up to a goal of 150 minutes of combined cardio and strengthening exercise per week or start upper body exercised 10 minutes a day for weight loss and overall health benefits. We discussed the following Behavioral Modification Stratagies today: increasing lean protein intake, decreasing simple carbohydrates  and increasing vegetables  Monique Gonzalez has agreed to follow up with our clinic in 2 weeks. She was informed of the importance of frequent follow up visits to maximize her success with intensive lifestyle modifications for her multiple health conditions.  I, Doreene Nest, am acting as scribe for Dennard Nip, MD  I have reviewed the above documentation for accuracy and completeness, and I agree with the above. -Dennard Nip, MD

## 2016-04-21 ENCOUNTER — Telehealth (INDEPENDENT_AMBULATORY_CARE_PROVIDER_SITE_OTHER): Payer: Self-pay | Admitting: *Deleted

## 2016-04-21 NOTE — Telephone Encounter (Signed)
Pt called with questions about surgery, how long she will be out, how long will she be in hospital and If she needs an appt before. Pt wants to see him in [place]. Also therapy in [place].

## 2016-04-22 ENCOUNTER — Ambulatory Visit (INDEPENDENT_AMBULATORY_CARE_PROVIDER_SITE_OTHER): Payer: 59 | Admitting: Family Medicine

## 2016-04-22 VITALS — BP 140/82 | HR 74 | Temp 98.6°F | Ht 72.0 in | Wt 270.0 lb

## 2016-04-22 DIAGNOSIS — E669 Obesity, unspecified: Secondary | ICD-10-CM

## 2016-04-22 DIAGNOSIS — E66812 Obesity, class 2: Secondary | ICD-10-CM | POA: Insufficient documentation

## 2016-04-22 DIAGNOSIS — Z6836 Body mass index (BMI) 36.0-36.9, adult: Secondary | ICD-10-CM

## 2016-04-22 DIAGNOSIS — E559 Vitamin D deficiency, unspecified: Secondary | ICD-10-CM

## 2016-04-22 MED ORDER — VITAMIN D (ERGOCALCIFEROL) 1.25 MG (50000 UNIT) PO CAPS: 50000.0000 [IU] | ORAL_CAPSULE | ORAL | 0 refills | Status: DC

## 2016-04-22 NOTE — Telephone Encounter (Signed)
I need to see her in the office again since it has been awhile

## 2016-04-22 NOTE — Telephone Encounter (Signed)
Patient wants to scheduled surgery for the last week/first week of June. She states she would like to get on "the books" because she has to make plans to fly her daughter in Do you need to see her first? She was last seen 09/20/15, and you said she is planning partial knee in the future

## 2016-04-23 NOTE — Progress Notes (Signed)
Office: [phone]  /  Fax: [phone]   HPI:   Chief Complaint: OBESITY Monique Gonzalez is here to discuss her progress with her obesity treatment plan. She is following her eating plan approximately 90 % of the time and states she is exercising 0 minutes 0 times per week. Monique Gonzalez was shown exercise band exercises today. Monique Gonzalez continues to do well with weight loss, she has tried to journal for dinner but is currently struggling with eating enough protein. She struggles, especially on weekends to schedule her food. Her journey didn't have any calories or protein journaling. Hunger is controlled.  Her weight is 270 lb (122.5 kg) today and has had a weight loss of 4 pounds over a period of 2 weeks since her last visit. She has lost 7 lbs since starting treatment with [name].  Vitamin D deficiency Monique Gonzalez has a diagnosis of vitamin D deficiency. She is not yet at goal. She is currently taking vit D and denies nausea, vomiting or muscle weakness.  Wt Readings from Last 500 Encounters:  04/22/16 270 lb (122.5 kg)  04/08/16 274 lb (124.3 kg)  03/25/16 277 lb (125.6 kg)  01/07/16 278 lb (126.1 kg)  03/22/15 268 lb (121.6 kg)  12/13/14 260 lb (117.9 kg)  08/08/14 240 lb (108.9 kg)  08/07/14 240 lb (108.9 kg)  12/30/13 231 lb (104.8 kg)  12/23/13 231 lb 9.6 oz (105.1 kg)  11/17/13 228 lb (103.4 kg)  11/01/13 223 lb (101.2 kg)  10/27/13 220 lb (99.8 kg)  10/10/13 226 lb 6.6 oz (102.7 kg)  10/09/13 230 lb (104.3 kg)  10/03/13 230 lb (104.3 kg)  09/29/13 233 lb (105.7 kg)  09/17/13 241 lb 6.4 oz (109.5 kg)  06/15/13 228 lb (103.4 kg)  04/05/13 225 lb (102.1 kg)  11/30/12 222 lb (100.7 kg)  11/22/12 216 lb (98 kg)  11/18/11 209 lb (94.8 kg)     ALLERGIES: Allergies  Allergen Reactions   Penicillins Rash   Sulfa Antibiotics Rash    MEDICATIONS: Current Outpatient Prescriptions on File Prior to Visit  Medication Sig Dispense Refill   acetaminophen (TYLENOL) 650 MG CR tablet Take 1,300 mg by mouth  at bedtime.     acyclovir cream (ZOVIRAX) 5 % Apply 1 application topically every 3 (three) hours. (Patient taking differently: Apply 1 application topically every 3 (three) hours as needed (outbreak). ) 15 g 3   atorvastatin (LIPITOR) 40 MG tablet TAKE 1 TABLET BY MOUTH DAILY 90 tablet 3   citalopram (CELEXA) 20 MG tablet Take 1 tablet (20 mg total) by mouth daily. 90 tablet 3   cyclobenzaprine (FLEXERIL) 10 MG tablet Take 1 tablet (10 mg total) by mouth 3 (three) times daily as needed for muscle spasms. 60 tablet 5   fluticasone (FLONASE) 50 MCG/ACT nasal spray Place 2 sprays into both nostrils daily. 16 g 6   loratadine (CLARITIN) 10 MG tablet Take 10 mg by mouth daily.       meloxicam (MOBIC) 15 MG tablet TAKE 1 TABLET (15 MG TOTAL) BY MOUTH DAILY. 90 tablet 3   Multiple Vitamins-Minerals (MULTIVITAMIN,TX-MINERALS) tablet Take 1 tablet by mouth daily.       ondansetron (ZOFRAN) 8 MG tablet Take 1 tablet (8 mg total) by mouth 3 (three) times daily. 60 tablet 0   valACYclovir (VALTREX) 1000 MG tablet TAKE 2 TABLETS BY MOUTH EVERY 12 HOURS FOR 2 DOSES AS NEEDED FOR FLARE 30 tablet 11   No current facility-administered medications on file prior to visit.  PAST MEDICAL HISTORY: Past Medical History:  Diagnosis Date   Allergy    Chronic pain    back and knee   Colitis 09/2012   infectious vs inflammatory.    Colon stricture    Depression    Hyperlipidemia    Obesity    Osteoarthritis    Osteopenia    Recurrent cold sores    Seasonal allergies    Syncope and collapse 11/30/2012   Normal EEG.  Vaso vagal syncope    PAST SURGICAL HISTORY: Past Surgical History:  Procedure Laterality Date   COLON RESECTION N/A 10/14/2013   Procedure: LAPAROSCOPIC MOBILIZATION OF SPLENIC FLEXURE, LAPAROSCOPIC EXTENDED LEFT COLECTOMY;  Surgeon: [name], MD;  Location: [facility];  Service: General;  Laterality: N/A;   COLONOSCOPY N/A 10/12/2013   Procedure: COLONOSCOPY;   Surgeon: [name], MD;  Location: [facility] ENDOSCOPY;  Service: Endoscopy;  Laterality: N/A;   DILATION AND CURETTAGE, DIAGNOSTIC / THERAPEUTIC  1987   OPEN REDUCTION INTERNAL FIXATION (ORIF) DISTAL RADIAL FRACTURE Right 08/08/2014   Procedure: OPEN REDUCTION INTERNAL FIXATION (ORIF) DISTAL RADIAL FRACTURE;  Surgeon: Roseanne Kaufman, MD;  Location: [facility];  Service: Orthopedics;  Laterality: Right;  DVR Crosslock, MD available sometime around 1430-1500   TONSILLECTOMY      SOCIAL HISTORY: Social History  Substance Use Topics   Smoking status: Former Smoker    Quit date: 10/27/1993   Smokeless tobacco: Never Used   Alcohol use Yes     Comment: one drink per week    FAMILY HISTORY: Family History  Problem Relation Age of Onset   Stroke Mother    Arthritis Mother    Thyroid disease Mother    Cancer Father    Arthritis Sister    Thyroid disease Sister    Cancer Maternal Grandmother    Stroke Maternal Grandfather    Cancer Maternal Grandfather    Colon cancer Maternal Grandfather    Cancer Paternal Grandmother    Heart disease Paternal Grandmother    Colon cancer Paternal Grandmother    Stroke Paternal Grandfather    Arthritis Sister    Obesity Sister    Sudden death Neg Hx    Hypertension Neg Hx    Hyperlipidemia Neg Hx    Heart attack Neg Hx    Diabetes Neg Hx    Rectal cancer Neg Hx    Stomach cancer Neg Hx    Esophageal cancer Neg Hx     ROS: Review of Systems  Constitutional: Positive for malaise/fatigue.  Gastrointestinal: Positive for vomiting. Negative for nausea.  Musculoskeletal:       Negative Muscle Weakness    PHYSICAL EXAM: Blood pressure 140/82, pulse 74, temperature 98.6 F (37 C), temperature source Oral, height 6' (1.829 m), weight 270 lb (122.5 kg), SpO2 98 %. Body mass index is 36.62 kg/m. Physical Exam  Constitutional: She is oriented to person, place, and time. She appears well-developed and well-nourished.    Cardiovascular: Normal rate.   Pulmonary/Chest: Effort normal.  Musculoskeletal: Normal range of motion.  Neurological: She is oriented to person, place, and time.  Skin: Skin is warm and dry.  Psychiatric: She has a normal mood and affect. Her behavior is normal.  Vitals reviewed.   RECENT LABS AND TESTS: BMET    Component Value Date/Time   NA 142 03/25/2016 1341   K 4.6 03/25/2016 1341   CL 101 03/25/2016 1341   CO2 26 03/25/2016 1341   GLUCOSE 94 03/25/2016 1341   GLUCOSE 79  03/20/2015 1420   BUN 16 03/25/2016 1341   CREATININE 0.79 03/25/2016 1341   CREATININE 0.82 03/20/2015 1420   CALCIUM 9.3 03/25/2016 1341   GFRNONAA 80 03/25/2016 1341   GFRNONAA 77 03/20/2015 1420   GFRAA 93 03/25/2016 1341   GFRAA 89 03/20/2015 1420   Lab Results  Component Value Date   HGBA1C 5.3 03/25/2016   HGBA1C (H) 09/11/2009    5.7 (NOTE)                                                                       According to the ADA Clinical Practice Recommendations for 2011, when HbA1c is used as a screening test:   >=6.5%   Diagnostic of Diabetes Mellitus           (if abnormal result  is confirmed)  5.7-6.4%   Increased risk of developing Diabetes Mellitus  References:Diagnosis and Classification of Diabetes Mellitus,Diabetes [id number] 1):S62-S69 and Standards of Medical Care in         Diabetes - 2011,Diabetes [id number]  (Suppl 1):S11-S61.   Lab Results  Component Value Date   INSULIN 5.5 03/25/2016   CBC    Component Value Date/Time   WBC 5.2 03/25/2016 1341   WBC 4.1 03/20/2015 1420   RBC 4.22 03/25/2016 1341   RBC 4.21 03/20/2015 1420   HGB 12.6 03/20/2015 1420   HCT 37.2 03/25/2016 1341   PLT 192 03/20/2015 1420   MCV 88 03/25/2016 1341   MCH 29.6 03/25/2016 1341   MCH 29.9 03/20/2015 1420   MCHC 33.6 03/25/2016 1341   MCHC 34.1 03/20/2015 1420   RDW 14.0 03/25/2016 1341   LYMPHSABS 1.2 03/25/2016 1341   MONOABS 0.5 03/20/2015 1420   EOSABS 0.0 03/25/2016  1341   BASOSABS 0.0 03/25/2016 1341   Iron/TIBC/Ferritin/ %Sat No results found for: IRON, TIBC, FERRITIN, IRONPCTSAT Lipid Panel     Component Value Date/Time   CHOL 172 03/25/2016 1341   TRIG 145 03/25/2016 1341   HDL 59 03/25/2016 1341   CHOLHDL 5.0 03/20/2015 1420   VLDL 35 (H) 03/20/2015 1420   LDLCALC 84 03/25/2016 1341   Hepatic Function Panel     Component Value Date/Time   PROT 6.5 03/25/2016 1341   ALBUMIN 4.2 03/25/2016 1341   AST 16 03/25/2016 1341   ALT 14 03/25/2016 1341   ALKPHOS 72 03/25/2016 1341   BILITOT 0.4 03/25/2016 1341      Component Value Date/Time   TSH 1.420 03/25/2016 1341   TSH 1.530 03/20/2015 1420   TSH 2.427 12/04/2014 0908    ASSESSMENT AND PLAN: Vitamin D deficiency - Plan: Vitamin D, Ergocalciferol, (DRISDOL) 50000 units CAPS capsule  Class 2 obesity without serious comorbidity with body mass index (BMI) of 36.0 to 36.9 in adult, unspecified obesity type  PLAN:  Vitamin D Deficiency Monique Gonzalez was informed that low vitamin D levels contributes to fatigue and are associated with obesity, breast, and colon cancer. She agrees to continue to take prescription Vit D @50 ,000 IU every week #4 with no refills and will follow up for routine testing of vitamin D, at least 2-3 times per year. She was informed of the risk of over-replacement of vitamin D and agrees to not increase her dose unless  he discusses this with [name] first.  We spent > than 50% of the 15 minute visit on the counseling as documented in the note.  Obesity Monique Gonzalez is currently in the action stage of change. As such, her goal is to continue with weight loss efforts She has agreed to follow the Category 3 plan Monique Gonzalez has been instructed to work up to a goal of 150 minutes of combined cardio and strengthening exercise per week or 5-10 minutes per day of exercise band exercises, 5 to 10 minutes per day to start for weight loss and overall health benefits.  We discussed the following Behavioral  Modification Stratagies today: increasing lean protein intake, increasing lower sugar fruits, decrease eating out and work on meal planning and easy cooking plans  Monique Gonzalez has agreed to follow up with our clinic in 2 weeks. She was informed of the importance of frequent follow up visits to maximize her success with intensive lifestyle modifications for her multiple health conditions.  I, Doreene Nest, am acting as scribe for Dennard Nip, MD  I have reviewed the above documentation for accuracy and completeness, and I agree with the above. -Dennard Nip, MD

## 2016-04-23 NOTE — Telephone Encounter (Signed)
Yes, she can be scheduled.  Forward to [employer]. Thanks

## 2016-04-23 NOTE — Telephone Encounter (Signed)
But can she be scheduled for surgeryand then see [name] in like May is what she's asking

## 2016-04-23 NOTE — Telephone Encounter (Signed)
See below , thanks

## 2016-05-06 ENCOUNTER — Other Ambulatory Visit (INDEPENDENT_AMBULATORY_CARE_PROVIDER_SITE_OTHER): Payer: Self-pay | Admitting: Family Medicine

## 2016-05-06 ENCOUNTER — Other Ambulatory Visit: Payer: Self-pay | Admitting: Family Medicine

## 2016-05-06 ENCOUNTER — Ambulatory Visit (INDEPENDENT_AMBULATORY_CARE_PROVIDER_SITE_OTHER): Payer: 59 | Admitting: Family Medicine

## 2016-05-06 VITALS — BP 127/80 | HR 69 | Temp 98.0°F | Ht 72.0 in | Wt 268.0 lb

## 2016-05-06 DIAGNOSIS — E559 Vitamin D deficiency, unspecified: Secondary | ICD-10-CM

## 2016-05-06 DIAGNOSIS — Z9189 Other specified personal risk factors, not elsewhere classified: Secondary | ICD-10-CM | POA: Diagnosis not present

## 2016-05-06 DIAGNOSIS — E669 Obesity, unspecified: Secondary | ICD-10-CM | POA: Diagnosis not present

## 2016-05-06 DIAGNOSIS — E785 Hyperlipidemia, unspecified: Secondary | ICD-10-CM | POA: Diagnosis not present

## 2016-05-06 MED ORDER — MELOXICAM 15 MG PO TABS: ORAL_TABLET | ORAL | 3 refills | Status: DC

## 2016-05-06 MED ORDER — CITALOPRAM HYDROBROMIDE 20 MG PO TABS: 20.0000 mg | ORAL_TABLET | Freq: Every day | ORAL | 3 refills | Status: DC

## 2016-05-06 MED ORDER — ATORVASTATIN CALCIUM 40 MG PO TABS: 40.0000 mg | ORAL_TABLET | Freq: Every day | ORAL | 3 refills | Status: DC

## 2016-05-06 MED FILL — MELOXICAM 15 MG TABLET: 15 | 90 days supply | Qty: 90 | Fill #0

## 2016-05-06 MED FILL — VIT D2 1.25 MG (50,000 UNIT: 1.25 MG | 28 days supply | Qty: 4 | Fill #0

## 2016-05-06 MED FILL — CITALOPRAM HBR 20 MG TABLET: 20 | 90 days supply | Qty: 90 | Fill #0

## 2016-05-06 MED FILL — ATORVASTATIN 40 MG TABLET: 40 | 90 days supply | Qty: 90 | Fill #0

## 2016-05-06 NOTE — Progress Notes (Signed)
Office: [phone]  /  Fax: [phone]   HPI:   Chief Complaint: OBESITY Monique Gonzalez is here to discuss her progress with her obesity treatment plan. She is following her eating plan approximately 90 % of the time and states she is exercising 0 minutes 0 times per week. Monique Gonzalez continues to lose weight and is trying to journal for dinner and doing well with protein. Hunger is controlled, cravings are manageable. She struggles to plan meals ahead of time. Her weight is 268 lb (121.6 kg) today and has had a weight loss of 2 pounds over a period of 2 weeks since her last visit. She has lost 9 lbs since starting treatment with [name].  Hyperlipidemia Monique Gonzalez has hyperlipidemia and is currently on Statin but attempts to improve with diet but she has questions on specific foods she can eat to both help decrease cholesterol and help weight loss. She has been trying to improve her cholesterol levels with intensive lifestyle modification including a low saturated fat diet, exercise and weight loss. She denies any chest pain, claudication or myalgias.  At risk for cardiovascular disease Monique Gonzalez is at a higher than average risk for cardiovascular disease due to obesity. She currently denies any chest pain.  Wt Readings from Last 500 Encounters:  05/06/16 268 lb (121.6 kg)  04/22/16 270 lb (122.5 kg)  04/08/16 274 lb (124.3 kg)  03/25/16 277 lb (125.6 kg)  01/07/16 278 lb (126.1 kg)  03/22/15 268 lb (121.6 kg)  12/13/14 260 lb (117.9 kg)  08/08/14 240 lb (108.9 kg)  08/07/14 240 lb (108.9 kg)  12/30/13 231 lb (104.8 kg)  12/23/13 231 lb 9.6 oz (105.1 kg)  11/17/13 228 lb (103.4 kg)  11/01/13 223 lb (101.2 kg)  10/27/13 220 lb (99.8 kg)  10/10/13 226 lb 6.6 oz (102.7 kg)  10/09/13 230 lb (104.3 kg)  10/03/13 230 lb (104.3 kg)  09/29/13 233 lb (105.7 kg)  09/17/13 241 lb 6.4 oz (109.5 kg)  06/15/13 228 lb (103.4 kg)  04/05/13 225 lb (102.1 kg)  11/30/12 222 lb (100.7 kg)  11/22/12 216 lb (98 kg)  11/18/11  209 lb (94.8 kg)     ALLERGIES: Allergies  Allergen Reactions  . Penicillins Rash  . Sulfa Antibiotics Rash    MEDICATIONS: Current Outpatient Prescriptions on File Prior to Visit  Medication Sig Dispense Refill  . acetaminophen (TYLENOL) 650 MG CR tablet Take 1,300 mg by mouth at bedtime.    Marland Kitchen acyclovir cream (ZOVIRAX) 5 % Apply 1 application topically every 3 (three) hours. (Patient taking differently: Apply 1 application topically every 3 (three) hours as needed (outbreak). ) 15 g 3  . cyclobenzaprine (FLEXERIL) 10 MG tablet Take 1 tablet (10 mg total) by mouth 3 (three) times daily as needed for muscle spasms. 60 tablet 5  . fluticasone (FLONASE) 50 MCG/ACT nasal spray Place 2 sprays into both nostrils daily. 16 g 6  . loratadine (CLARITIN) 10 MG tablet Take 10 mg by mouth daily.      . Multiple Vitamins-Minerals (MULTIVITAMIN,TX-MINERALS) tablet Take 1 tablet by mouth daily.      . ondansetron (ZOFRAN) 8 MG tablet Take 1 tablet (8 mg total) by mouth 3 (three) times daily. 60 tablet 0  . valACYclovir (VALTREX) 1000 MG tablet TAKE 2 TABLETS BY MOUTH EVERY 12 HOURS FOR 2 DOSES AS NEEDED FOR FLARE 30 tablet 11  . Vitamin D, Ergocalciferol, (DRISDOL) 50000 units CAPS capsule Take 1 capsule (50,000 Units total) by mouth every 7 (seven)  days. 4 capsule 0   No current facility-administered medications on file prior to visit.     PAST MEDICAL HISTORY: Past Medical History:  Diagnosis Date  . Allergy   . Chronic pain    back and knee  . Colitis 09/2012   infectious vs inflammatory.   . Colon stricture   . Depression   . Hyperlipidemia   . Obesity   . Osteoarthritis   . Osteopenia   . Recurrent cold sores   . Seasonal allergies   . Syncope and collapse 11/30/2012   Normal EEG.  Vaso vagal syncope    PAST SURGICAL HISTORY: Past Surgical History:  Procedure Laterality Date  . COLON RESECTION N/A 10/14/2013   Procedure: LAPAROSCOPIC MOBILIZATION OF SPLENIC FLEXURE, LAPAROSCOPIC  EXTENDED LEFT COLECTOMY;  Surgeon: [name], MD;  Location: [facility];  Service: General;  Laterality: N/A;  . COLONOSCOPY N/A 10/12/2013   Procedure: COLONOSCOPY;  Surgeon: [name], MD;  Location: [facility] ENDOSCOPY;  Service: Endoscopy;  Laterality: N/A;  . DILATION AND CURETTAGE, DIAGNOSTIC / THERAPEUTIC  1987  . OPEN REDUCTION INTERNAL FIXATION (ORIF) DISTAL RADIAL FRACTURE Right 08/08/2014   Procedure: OPEN REDUCTION INTERNAL FIXATION (ORIF) DISTAL RADIAL FRACTURE;  Surgeon: Roseanne Kaufman, MD;  Location: [place];  Service: Orthopedics;  Laterality: Right;  DVR Crosslock, MD available sometime around 1430-1500  . TONSILLECTOMY      SOCIAL HISTORY: Social History  Substance Use Topics  . Smoking status: Former Smoker    Quit date: 10/27/1993  . Smokeless tobacco: Never Used  . Alcohol use Yes     Comment: one drink per week    FAMILY HISTORY: Family History  Problem Relation Age of Onset  . Stroke Mother   . Arthritis Mother   . Thyroid disease Mother   . Cancer Father   . Arthritis Sister   . Thyroid disease Sister   . Cancer Maternal Grandmother   . Stroke Maternal Grandfather   . Cancer Maternal Grandfather   . Colon cancer Maternal Grandfather   . Cancer Paternal Grandmother   . Heart disease Paternal Grandmother   . Colon cancer Paternal Grandmother   . Stroke Paternal Grandfather   . Arthritis Sister   . Obesity Sister   . Sudden death Neg Hx   . Hypertension Neg Hx   . Hyperlipidemia Neg Hx   . Heart attack Neg Hx   . Diabetes Neg Hx   . Rectal cancer Neg Hx   . Stomach cancer Neg Hx   . Esophageal cancer Neg Hx     ROS: Review of Systems  Constitutional: Positive for weight loss.  Cardiovascular: Negative for chest pain and claudication.  Musculoskeletal: Negative for myalgias.    PHYSICAL EXAM: Blood pressure 127/80, pulse 69, temperature 98 F (36.7 C), temperature source Oral, height 6' (1.829 m), weight 268 lb (121.6 kg), SpO2 100 %. Body mass index  is 36.35 kg/m. Physical Exam  Constitutional: She is oriented to person, place, and time. She appears well-developed and well-nourished.  Cardiovascular: Normal rate.   Pulmonary/Chest: Effort normal.  Musculoskeletal: Normal range of motion.  Neurological: She is oriented to person, place, and time.  Skin: Skin is warm and dry.  Psychiatric: She has a normal mood and affect. Her behavior is normal.  Vitals reviewed.   RECENT LABS AND TESTS: BMET    Component Value Date/Time   NA 142 03/25/2016 1341   K 4.6 03/25/2016 1341   CL 101 03/25/2016 1341   CO2  26 03/25/2016 1341   GLUCOSE 94 03/25/2016 1341   GLUCOSE 79 03/20/2015 1420   BUN 16 03/25/2016 1341   CREATININE 0.79 03/25/2016 1341   CREATININE 0.82 03/20/2015 1420   CALCIUM 9.3 03/25/2016 1341   GFRNONAA 80 03/25/2016 1341   GFRNONAA 77 03/20/2015 1420   GFRAA 93 03/25/2016 1341   GFRAA 89 03/20/2015 1420   Lab Results  Component Value Date   HGBA1C 5.3 03/25/2016   HGBA1C (H) 09/11/2009    5.7 (NOTE)                                                                       According to the ADA Clinical Practice Recommendations for 2011, when HbA1c is used as a screening test:   >=6.5%   Diagnostic of Diabetes Mellitus           (if abnormal result  is confirmed)  5.7-6.4%   Increased risk of developing Diabetes Mellitus  References:Diagnosis and Classification of Diabetes Mellitus,Diabetes [id number] 1):S62-S69 and Standards of Medical Care in         Diabetes - 2011,Diabetes [id number]  (Suppl 1):S11-S61.   Lab Results  Component Value Date   INSULIN 5.5 03/25/2016   CBC    Component Value Date/Time   WBC 5.2 03/25/2016 1341   WBC 4.1 03/20/2015 1420   RBC 4.22 03/25/2016 1341   RBC 4.21 03/20/2015 1420   HGB 12.6 03/20/2015 1420   HCT 37.2 03/25/2016 1341   PLT 192 03/20/2015 1420   MCV 88 03/25/2016 1341   MCH 29.6 03/25/2016 1341   MCH 29.9 03/20/2015 1420   MCHC 33.6 03/25/2016 1341   MCHC  34.1 03/20/2015 1420   RDW 14.0 03/25/2016 1341   LYMPHSABS 1.2 03/25/2016 1341   MONOABS 0.5 03/20/2015 1420   EOSABS 0.0 03/25/2016 1341   BASOSABS 0.0 03/25/2016 1341   Iron/TIBC/Ferritin/ %Sat No results found for: IRON, TIBC, FERRITIN, IRONPCTSAT Lipid Panel     Component Value Date/Time   CHOL 172 03/25/2016 1341   TRIG 145 03/25/2016 1341   HDL 59 03/25/2016 1341   CHOLHDL 5.0 03/20/2015 1420   VLDL 35 (H) 03/20/2015 1420   LDLCALC 84 03/25/2016 1341   Hepatic Function Panel     Component Value Date/Time   PROT 6.5 03/25/2016 1341   ALBUMIN 4.2 03/25/2016 1341   AST 16 03/25/2016 1341   ALT 14 03/25/2016 1341   ALKPHOS 72 03/25/2016 1341   BILITOT 0.4 03/25/2016 1341      Component Value Date/Time   TSH 1.420 03/25/2016 1341   TSH 1.530 03/20/2015 1420   TSH 2.427 12/04/2014 0908    ASSESSMENT AND PLAN: Hyperlipidemia, unspecified hyperlipidemia type  At risk for heart disease  Obesity (BMI 35.0-39.9 without comorbidity)  PLAN:  Hyperlipidemia Santana was informed of the American Heart Association Guidelines emphasizing intensive lifestyle modifications as the first line treatment for hyperlipidemia. We discussed many lifestyle modifications today in depth, and Lawrencia will continue to work on decreasing saturated fats such as fatty red meat, butter and many fried foods. She will also increase vegetables and lean protein in her diet and continue to work on exercise and weight loss efforts. We will re-check labs in 6 weeks and follow.  Cardiovascular risk counselling Lenda was given extended (at least 15 minutes) coronary artery disease prevention counseling today. She is 61 y.o. female and has risk factors for heart disease including obesity. We discussed intensive lifestyle modifications today with an emphasis on specific weight loss instructions and strategies. Pt was also informed of the importance of increasing exercise and decreasing saturated fats to help prevent  heart disease.  Obesity Nyalise is currently in the action stage of change. As such, her goal is to continue with weight loss efforts She has agreed to keep a food journal with 500 calories and 35 grams of protein daily and follow the Category 3 plan Alevia has been instructed to work up to a goal of 150 minutes of combined cardio and strengthening exercise per week for weight loss and overall health benefits. We discussed the following Behavioral Modification Stratagies today: increasing lean protein intake, decreasing simple carbohydrates , increasing vegetables, increasing lower sugar fruits, work on meal planning and easy cooking plans and dealing with family or coworker sabotage  Caetlin has agreed to follow up with our clinic in 2 weeks. She was informed of the importance of frequent follow up visits to maximize her success with intensive lifestyle modifications for her multiple health conditions.  I, Doreene Nest, am acting as scribe for Dennard Nip, MD  I have reviewed the above documentation for accuracy and completeness, and I agree with the above. -Dennard Nip, MD

## 2016-05-06 NOTE — Telephone Encounter (Signed)
She shouldn't need this yet

## 2016-05-06 NOTE — Telephone Encounter (Signed)
Verified with the pharmacy this prescription was on hold from 2/13 and they are getting it ready for the patient.

## 2016-05-07 ENCOUNTER — Encounter (INDEPENDENT_AMBULATORY_CARE_PROVIDER_SITE_OTHER): Payer: Self-pay | Admitting: Family Medicine

## 2016-05-12 ENCOUNTER — Encounter (INDEPENDENT_AMBULATORY_CARE_PROVIDER_SITE_OTHER): Payer: Self-pay | Admitting: Family Medicine

## 2016-05-12 NOTE — Telephone Encounter (Signed)
Please let me know if you receive this.

## 2016-05-15 ENCOUNTER — Telehealth (INDEPENDENT_AMBULATORY_CARE_PROVIDER_SITE_OTHER): Payer: Self-pay | Admitting: Orthopaedic Surgery

## 2016-05-15 NOTE — Telephone Encounter (Signed)
Patient is calling about the surgery being scheduled for May 29th - June 1st (and if requesting this if at all possible) and she states that she understands this is early, but is trying to coordinate well in advance because she is flying her daughter in and the tickets will be non-refundable. Also she is asking if she will need to come back in to see Ninfa Linden or Artis Delay before the surgery.  Please advise.

## 2016-05-20 ENCOUNTER — Encounter (INDEPENDENT_AMBULATORY_CARE_PROVIDER_SITE_OTHER): Payer: Self-pay | Admitting: Family Medicine

## 2016-05-20 ENCOUNTER — Telehealth (INDEPENDENT_AMBULATORY_CARE_PROVIDER_SITE_OTHER): Payer: Self-pay | Admitting: Orthopaedic Surgery

## 2016-05-20 ENCOUNTER — Ambulatory Visit (INDEPENDENT_AMBULATORY_CARE_PROVIDER_SITE_OTHER): Payer: Self-pay

## 2016-05-20 ENCOUNTER — Ambulatory Visit (INDEPENDENT_AMBULATORY_CARE_PROVIDER_SITE_OTHER): Payer: 59 | Admitting: Orthopaedic Surgery

## 2016-05-20 DIAGNOSIS — M25561 Pain in right knee: Secondary | ICD-10-CM | POA: Diagnosis not present

## 2016-05-20 DIAGNOSIS — M1711 Unilateral primary osteoarthritis, right knee: Secondary | ICD-10-CM | POA: Insufficient documentation

## 2016-05-20 DIAGNOSIS — G8929 Other chronic pain: Secondary | ICD-10-CM | POA: Diagnosis not present

## 2016-05-20 MED ORDER — METHYLPREDNISOLONE ACETATE 40 MG/ML IJ SUSP
40.0000 mg | INTRAMUSCULAR | Status: AC | PRN
  Administered 2016-05-20: 40 mg via INTRA_ARTICULAR

## 2016-05-20 NOTE — Telephone Encounter (Signed)
Please call to reschedule.

## 2016-05-20 NOTE — Telephone Encounter (Signed)
Cb#: [phone] (patient cell) Fax#: [phone] (work) Pharm: [facility] in [place]   -Requesting pain med (cant take toradol)  -Also is requesting a note to limit her walking at work.

## 2016-05-20 NOTE — Telephone Encounter (Signed)
Please advise 

## 2016-05-20 NOTE — Progress Notes (Signed)
Office Visit Note   Patient: Monique Gonzalez           Date of Birth: 1956/01/03           MRN: [id number] Visit Date: 05/20/2016              Requested by: Susy Frizzle, MD [address] [facility] [place], [place] [address] PCP: Odette Fraction, MD   Assessment & Plan: Visit Diagnoses:  1. Chronic pain of right knee   2. Unilateral primary osteoarthritis, right knee     Plan: At this point she does wish to proceed with knee replacement surgery given the disability that this knee pain and arthritis is causing her. I agree with this as well. She has tried and failed all forms conservative treatment for many years now. It is got a point where there is such acute problems with her knee that this is the best option for her at this point. With a long and thorough discussion of knee replacement surgery. I gave her handout over x-rays and we talked in great detail about what her intraoperative and postoperative courses be as well as a thorough discussion of the risk and benefits of surgery. She tolerated steroid injection well and her right knee MRN tried knee braces well to get her through to the surgery date. She'll continue assistive device as well. We see her back in 2 weeks postoperative no x-rays of be needed.  Follow-Up Instructions: Return for 2 weeks post-op.   Orders:  Orders Placed This Encounter  Procedures  . Large Joint Injection/Arthrocentesis  . XR Knee 1-2 Views Right   No orders of the defined types were placed in this encounter.     Procedures: Large Joint Inj Date/Time: 05/20/2016 11:29 AM Performed by: Mcarthur Rossetti Authorized by: Mcarthur Rossetti   Location:  Knee Site:  R knee Ultrasound Guidance: No   Fluoroscopic Guidance: No   Arthrogram: No   Medications:  40 mg methylPREDNISolone acetate 40 MG/ML     Clinical Data: No additional findings.   Subjective: No chief complaint on file. The patient is someone I've seen before. She  is a 61 year old female with severe debilitating arthritis of her right knee that is been well documented. Will last place an injection in her knee last summer and she was trying to wait until June to proceed with a knee replacement surgery. However her knee has given out on her and it is causing considerable pain and she points the medial side of her knee as a source of that pain. At this point it is 10 out of 10. She is having use an assistive device get around. It is detrimentally affecting her activities daily living, her quality of life, and her mobility.  HPI  Review of Systems She denies any chest pain, headache, fever, chills, nausea, vomiting, shortness of breath  Objective: Vital Signs: There were no vitals taken for this visit.  Physical Exam She is alert and oriented 3 and in no acute distress Ortho Exam Examination of her right knee show significant medial joint line tenderness. There is patellofemoral crepitation as well as a positive Murray sign to the medial compartment of her knee. Her pain is quite severe especially with range of motion past 90. Specialty Comments:  No specialty comments available.  Imaging: Xr Knee 1-2 Views Right  Result Date: 05/20/2016 2 views AP and lateral of the right knee show complete loss of the medial joint space. There  severe sclerotic and cystic changes in the medial compartment. There is also significant patellofemoral arthritic changes. There is a mild varus    PMFS History: Patient Active Problem List   Diagnosis Date Noted  . Unilateral primary osteoarthritis, right knee 05/20/2016  . Obesity (BMI 35.0-39.9 without comorbidity) 05/06/2016  . Class 2 obesity without serious comorbidity with body mass index (BMI) of 36.0 to 36.9 in adult 04/22/2016  . Class 2 obesity without serious comorbidity with body mass index (BMI) of 37.0 to 37.9 in adult 04/08/2016  . Vitamin D deficiency 04/08/2016  . Shortness of breath 03/25/2016  .  Fracture, radius 08/08/2014  . Constipation 09/20/2013  . Syncope and collapse 11/30/2012  . Recurrent cold sores   . Right knee pain 11/23/2011  . Osteoarthritis   . Depression   . Hyperlipidemia    Past Medical History:  Diagnosis Date  . Allergy   . Chronic pain    back and knee  . Colitis 09/2012   infectious vs inflammatory.   . Colon stricture   . Depression   . Hyperlipidemia   . Obesity   . Osteoarthritis   . Osteopenia   . Recurrent cold sores   . Seasonal allergies   . Syncope and collapse 11/30/2012   Normal EEG.  Vaso vagal syncope    Family History  Problem Relation Age of Onset  . Stroke Mother   . Arthritis Mother   . Thyroid disease Mother   . Cancer Father   . Arthritis Sister   . Thyroid disease Sister   . Cancer Maternal Grandmother   . Stroke Maternal Grandfather   . Cancer Maternal Grandfather   . Colon cancer Maternal Grandfather   . Cancer Paternal Grandmother   . Heart disease Paternal Grandmother   . Colon cancer Paternal Grandmother   . Stroke Paternal Grandfather   . Arthritis Sister   . Obesity Sister   . Sudden death Neg Hx   . Hypertension Neg Hx   . Hyperlipidemia Neg Hx   . Heart attack Neg Hx   . Diabetes Neg Hx   . Rectal cancer Neg Hx   . Stomach cancer Neg Hx   . Esophageal cancer Neg Hx     Past Surgical History:  Procedure Laterality Date  . COLON RESECTION N/A 10/14/2013   Procedure: LAPAROSCOPIC MOBILIZATION OF SPLENIC FLEXURE, LAPAROSCOPIC EXTENDED LEFT COLECTOMY;  Surgeon: [name], MD;  Location: [place];  Service: General;  Laterality: N/A;  . COLONOSCOPY N/A 10/12/2013   Procedure: COLONOSCOPY;  Surgeon: [name], MD;  Location: [facility] ENDOSCOPY;  Service: Endoscopy;  Laterality: N/A;  . DILATION AND CURETTAGE, DIAGNOSTIC / THERAPEUTIC  1987  . OPEN REDUCTION INTERNAL FIXATION (ORIF) DISTAL RADIAL FRACTURE Right 08/08/2014   Procedure: OPEN REDUCTION INTERNAL FIXATION (ORIF) DISTAL RADIAL FRACTURE;  Surgeon:  Roseanne Kaufman, MD;  Location: [place];  Service: Orthopedics;  Laterality: Right;  DVR Crosslock, MD available sometime around 1430-1500  . TONSILLECTOMY     Social History   Occupational History  . 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 Smoking status: Former Smoker    Quit date: 10/27/1993  . Smokeless tobacco: Never Used  . Alcohol use Yes     Comment: one drink per week  . Drug use: No  . Sexual activity: Not Currently

## 2016-05-21 ENCOUNTER — Ambulatory Visit (INDEPENDENT_AMBULATORY_CARE_PROVIDER_SITE_OTHER): Payer: 59 | Admitting: Family Medicine

## 2016-05-21 ENCOUNTER — Encounter (INDEPENDENT_AMBULATORY_CARE_PROVIDER_SITE_OTHER): Payer: Self-pay | Admitting: Orthopaedic Surgery

## 2016-05-21 VITALS — BP 136/85 | HR 72 | Temp 97.7°F | Ht 72.0 in | Wt 265.0 lb

## 2016-05-21 DIAGNOSIS — E669 Obesity, unspecified: Secondary | ICD-10-CM | POA: Diagnosis not present

## 2016-05-21 DIAGNOSIS — Z6836 Body mass index (BMI) 36.0-36.9, adult: Secondary | ICD-10-CM | POA: Diagnosis not present

## 2016-05-21 DIAGNOSIS — F3289 Other specified depressive episodes: Secondary | ICD-10-CM | POA: Diagnosis not present

## 2016-05-21 MED ORDER — BUPROPION HCL ER (SR) 150 MG PO TB12: 150.0000 mg | ORAL_TABLET | Freq: Every day | ORAL | 0 refills | Status: DC

## 2016-05-21 MED FILL — BUPROPION SR 150 MG TABLET: 150 | 30 days supply | Qty: 30 | Fill #0

## 2016-05-21 NOTE — Telephone Encounter (Signed)
Should we be more specific on "limiting her walking at work"?

## 2016-05-21 NOTE — Telephone Encounter (Signed)
She should stand of only short periods of time and no walking long distances

## 2016-05-21 NOTE — Telephone Encounter (Signed)
Ok for a work note limiting her walking at work.  Also, call in Tylenol#3, 1-2 every 8 hours as needed for pain, #60.  Thanks

## 2016-05-21 NOTE — Progress Notes (Signed)
Office: [phone]  /  Fax: [phone]   HPI:   Chief Complaint: OBESITY Monique Gonzalez is here to discuss her progress with her obesity treatment plan. She is following her eating plan approximately 75 % of the time and states she is exercising 0 minutes 0 times per week. [name] continues to do well with weight loss. She had some increased emotional eating with stress. Her weight is 265 lb (120.2 kg) today and has had a weight loss of 3 pounds over a period of 2 weeks since her last visit. She has lost 12 lbs since starting treatment with [name].  Depression with emotional eating behaviors [name] is struggling with increased emotional eating over the past 2 weeks and using food for comfort to the extent that it is negatively impacting her health. She often snacks when she is not hungry. [name] sometimes feels she is out of control and then feels guilty that she made poor food choices. She has been working on behavior modification techniques to help reduce her emotional eating and has been moderately successful. She shows no sign of suicidal or homicidal ideations.  Depression screen [facility] 2/9 03/25/2016 11/22/2012  Decreased Interest 3 0  Down, Depressed, Hopeless 3 0  PHQ - 2 Score 6 0  Altered sleeping 3 -  Tired, decreased energy 3 -  Change in appetite 3 -  Feeling bad or failure about yourself  3 -  Trouble concentrating 3 -  Moving slowly or fidgety/restless 3 -  Suicidal thoughts 0 -  PHQ-9 Score 24 -     Wt Readings from Last 500 Encounters:  05/21/16 265 lb (120.2 kg)  05/06/16 268 lb (121.6 kg)  04/22/16 270 lb (122.5 kg)  04/08/16 274 lb (124.3 kg)  03/25/16 277 lb (125.6 kg)  01/07/16 278 lb (126.1 kg)  03/22/15 268 lb (121.6 kg)  12/13/14 260 lb (117.9 kg)  08/08/14 240 lb (108.9 kg)  08/07/14 240 lb (108.9 kg)  12/30/13 231 lb (104.8 kg)  12/23/13 231 lb 9.6 oz (105.1 kg)  11/17/13 228 lb (103.4 kg)  11/01/13 223 lb (101.2 kg)  10/27/13 220 lb (99.8 kg)  10/10/13 226 lb 6.6 oz  (102.7 kg)  10/09/13 230 lb (104.3 kg)  10/03/13 230 lb (104.3 kg)  09/29/13 233 lb (105.7 kg)  09/17/13 241 lb 6.4 oz (109.5 kg)  06/15/13 228 lb (103.4 kg)  04/05/13 225 lb (102.1 kg)  11/30/12 222 lb (100.7 kg)  11/22/12 216 lb (98 kg)  11/18/11 209 lb (94.8 kg)     ALLERGIES: Allergies  Allergen Reactions  . Penicillins Rash  . Sulfa Antibiotics Rash    MEDICATIONS: Current Outpatient Prescriptions on File Prior to Visit  Medication Sig Dispense Refill  . acetaminophen (TYLENOL) 650 MG CR tablet Take 1,300 mg by mouth at bedtime.    Marland Kitchen acyclovir cream (ZOVIRAX) 5 % Apply 1 application topically every 3 (three) hours. (Patient taking differently: Apply 1 application topically every 3 (three) hours as needed (outbreak). ) 15 g 3  . atorvastatin (LIPITOR) 40 MG tablet Take 1 tablet (40 mg total) by mouth daily. 90 tablet 3  . citalopram (CELEXA) 20 MG tablet Take 1 tablet (20 mg total) by mouth daily. 90 tablet 3  . cyclobenzaprine (FLEXERIL) 10 MG tablet Take 1 tablet (10 mg total) by mouth 3 (three) times daily as needed for muscle spasms. 60 tablet 5  . fluticasone (FLONASE) 50 MCG/ACT nasal spray Place 2 sprays into both nostrils daily. 16 g 6  .  loratadine (CLARITIN) 10 MG tablet Take 10 mg by mouth daily.      . meloxicam (MOBIC) 15 MG tablet TAKE 1 TABLET (15 MG TOTAL) BY MOUTH DAILY. 90 tablet 3  . Multiple Vitamins-Minerals (MULTIVITAMIN,TX-MINERALS) tablet Take 1 tablet by mouth daily.      . ondansetron (ZOFRAN) 8 MG tablet Take 1 tablet (8 mg total) by mouth 3 (three) times daily. 60 tablet 0  . valACYclovir (VALTREX) 1000 MG tablet TAKE 2 TABLETS BY MOUTH EVERY 12 HOURS FOR 2 DOSES AS NEEDED FOR FLARE 30 tablet 11  . Vitamin D, Ergocalciferol, (DRISDOL) 50000 units CAPS capsule Take 1 capsule (50,000 Units total) by mouth every 7 (seven) days. 4 capsule 0   No current facility-administered medications on file prior to visit.     PAST MEDICAL HISTORY: Past Medical  History:  Diagnosis Date  . Allergy   . Chronic pain    back and knee  . Colitis 09/2012   infectious vs inflammatory.   . Colon stricture   . Depression   . Hyperlipidemia   . Obesity   . Osteoarthritis   . Osteopenia   . Recurrent cold sores   . Seasonal allergies   . Syncope and collapse 11/30/2012   Normal EEG.  Vaso vagal syncope    PAST SURGICAL HISTORY: Past Surgical History:  Procedure Laterality Date  . COLON RESECTION N/A 10/14/2013   Procedure: LAPAROSCOPIC MOBILIZATION OF SPLENIC FLEXURE, LAPAROSCOPIC EXTENDED LEFT COLECTOMY;  Surgeon: [name], MD;  Location: [place];  Service: General;  Laterality: N/A;  . COLONOSCOPY N/A 10/12/2013   Procedure: COLONOSCOPY;  Surgeon: [name], MD;  Location: [facility] END[facility]COPY;  Service: Endoscopy;  Laterality: N/A;  . DILATION AND CURETTAGE, DIAGNOSTIC / THERAPEUTIC  1987  . OPEN REDUCTION INTERNAL FIXATION (ORIF) DISTAL RADIAL FRACTURE Right 08/08/2014   Procedure: OPEN REDUCTION INTERNAL FIXATION (ORIF) DISTAL RADIAL FRACTURE;  Surgeon: Roseanne Kaufman, MD;  Location: [place];  Service: Orthopedics;  Laterality: Right;  DVR Crosslock, MD available sometime around 1430-1500  . TONSILLECTOMY      SOCIAL HISTORY: Social History  Substance Use Topics  . Smoking status: Former Smoker    Quit date: 10/27/1993  . Smokeless tobacco: Never Used  . Alcohol use Yes     Comment: one drink per week    FAMILY HISTORY: Family History  Problem Relation Age of Onset  . Stroke Mother   . Arthritis Mother   . Thyroid disease Mother   . Cancer Father   . Arthritis Sister   . Thyroid disease Sister   . Cancer Maternal Grandmother   . Stroke Maternal Grandfather   . Cancer Maternal Grandfather   . Colon cancer Maternal Grandfather   . Cancer Paternal Grandmother   . Heart disease Paternal Grandmother   . Colon cancer Paternal Grandmother   . Stroke Paternal Grandfather   . Arthritis Sister   . Obesity Sister   . Sudden death Neg Hx   .  Hypertension Neg Hx   . Hyperlipidemia Neg Hx   . Heart attack Neg Hx   . Diabetes Neg Hx   . Rectal cancer Neg Hx   . Stomach cancer Neg Hx   . Esophageal cancer Neg Hx     ROS: Review of Systems  Constitutional: Positive for weight loss.  Psychiatric/Behavioral: Negative for suicidal ideas.       Stress    PHYSICAL EXAM: Blood pressure 136/85, pulse 72, temperature 97.7 F (36.5 C), temperature source  Oral, height 6' (1.829 m), weight 265 lb (120.2 kg), SpO2 95 %. Body mass index is 35.94 kg/m. Physical Exam  Constitutional: She is oriented to person, place, and time. She appears well-developed and well-nourished.  Cardiovascular: Normal rate.   Pulmonary/Chest: Effort normal.  Musculoskeletal: Normal range of motion.  Neurological: She is oriented to person, place, and time.  Skin: Skin is warm and dry.  Vitals reviewed.   RECENT LABS AND TESTS: BMET    Component Value Date/Time   NA 142 03/25/2016 1341   K 4.6 03/25/2016 1341   CL 101 03/25/2016 1341   CO2 26 03/25/2016 1341   GLUCOSE 94 03/25/2016 1341   GLUCOSE 79 03/20/2015 1420   BUN 16 03/25/2016 1341   CREATININE 0.79 03/25/2016 1341   CREATININE 0.82 03/20/2015 1420   CALCIUM 9.3 03/25/2016 1341   GFRNONAA 80 03/25/2016 1341   GFRNONAA 77 03/20/2015 1420   GFRAA 93 03/25/2016 1341   GFRAA 89 03/20/2015 1420   Lab Results  Component Value Date   HGBA1C 5.3 03/25/2016   HGBA1C (H) 09/11/2009    5.7 (NOTE)                                                                       According to the ADA Clinical Practice Recommendations for 2011, when HbA1c is used as a screening test:   >=6.5%   Diagnostic of Diabetes Mellitus           (if abnormal result  is confirmed)  5.7-6.4%   Increased risk of developing Diabetes Mellitus  References:Diagnosis and Classification of Diabetes Mellitus,Diabetes IHKV,4259,56(LOVFI 1):S62-S69 and Standards of Medical Care in         Diabetes - 2011,Diabetes EPPI,9518,84    (Suppl 1):S11-S61.   Lab Results  Component Value Date   INSULIN 5.5 03/25/2016   CBC    Component Value Date/Time   WBC 5.2 03/25/2016 1341   WBC 4.1 03/20/2015 1420   RBC 4.22 03/25/2016 1341   RBC 4.21 03/20/2015 1420   HGB 12.6 03/20/2015 1420   HCT 37.2 03/25/2016 1341   PLT 192 03/20/2015 1420   MCV 88 03/25/2016 1341   MCH 29.6 03/25/2016 1341   MCH 29.9 03/20/2015 1420   MCHC 33.6 03/25/2016 1341   MCHC 34.1 03/20/2015 1420   RDW 14.0 03/25/2016 1341   LYMPHSABS 1.2 03/25/2016 1341   MONOABS 0.5 03/20/2015 1420   EOSABS 0.0 03/25/2016 1341   BASOSABS 0.0 03/25/2016 1341   Iron/TIBC/Ferritin/ %Sat No results found for: IRON, TIBC, FERRITIN, IRONPCTSAT Lipid Panel     Component Value Date/Time   CHOL 172 03/25/2016 1341   TRIG 145 03/25/2016 1341   HDL 59 03/25/2016 1341   CHOLHDL 5.0 03/20/2015 1420   VLDL 35 (H) 03/20/2015 1420   LDLCALC 84 03/25/2016 1341   Hepatic Function Panel     Component Value Date/Time   PROT 6.5 03/25/2016 1341   ALBUMIN 4.2 03/25/2016 1341   AST 16 03/25/2016 1341   ALT 14 03/25/2016 1341   ALKPHOS 72 03/25/2016 1341   BILITOT 0.4 03/25/2016 1341      Component Value Date/Time   TSH 1.420 03/25/2016 1341   TSH 1.530 03/20/2015 1420   TSH 2.427 12/04/2014  0908    ASSESSMENT AND PLAN: Other depression - Plan: buPROPion (WELLBUTRIN SR) 150 MG 12 hr tablet  Class 2 obesity without serious comorbidity with body mass index (BMI) of 36.0 to 36.9 in adult, unspecified obesity type  PLAN:  Depression with Emotional Eating Behaviors We discussed behavior modification techniques today to help Perry deal with her emotional eating and depression. She has agreed to continue to take Celexa and start Wellbutrin SR 150 mg qd #30 with no refills and agreed to follow up as directed.  Obesity Camron is currently in the action stage of change. As such, her goal is to continue with weight loss efforts She has agreed to follow the Category  3 plan Gisel has been instructed to work up to a goal of 150 minutes of combined cardio and strengthening exercise per week for weight loss and overall health benefits. We discussed the following Behavioral Modification Stratagies today: increasing lean protein intake and increasing lower sugar fruits  Najee has agreed to follow up with our clinic in 2 weeks. She was informed of the importance of frequent follow up visits to maximize her success with intensive lifestyle modifications for her multiple health conditions.  I, Doreene Nest, am acting as scribe for Dennard Nip, MD  I have reviewed the above documentation for accuracy and completeness, and I agree with the above. -Dennard Nip, MD

## 2016-05-23 NOTE — Telephone Encounter (Signed)
Faxed note.

## 2016-05-23 NOTE — Telephone Encounter (Signed)
I called patient and scheduled surgery. 

## 2016-06-03 ENCOUNTER — Ambulatory Visit (INDEPENDENT_AMBULATORY_CARE_PROVIDER_SITE_OTHER): Payer: 59 | Admitting: Family Medicine

## 2016-06-03 VITALS — BP 130/82 | HR 84 | Temp 97.7°F | Ht 72.0 in | Wt 264.0 lb

## 2016-06-03 DIAGNOSIS — E669 Obesity, unspecified: Secondary | ICD-10-CM | POA: Diagnosis not present

## 2016-06-03 DIAGNOSIS — E559 Vitamin D deficiency, unspecified: Secondary | ICD-10-CM

## 2016-06-03 DIAGNOSIS — Z6835 Body mass index (BMI) 35.0-35.9, adult: Secondary | ICD-10-CM

## 2016-06-03 MED ORDER — VITAMIN D (ERGOCALCIFEROL) 1.25 MG (50000 UNIT) PO CAPS: 50000.0000 [IU] | ORAL_CAPSULE | ORAL | 0 refills | Status: DC

## 2016-06-03 MED FILL — VIT D2 1.25 MG (50,000 UNIT: 1.25 MG | 28 days supply | Qty: 4 | Fill #0

## 2016-06-03 NOTE — Progress Notes (Signed)
Office: [phone]  /  Fax: [phone]   HPI:   Chief Complaint: OBESITY Monique Gonzalez is here to discuss her progress with her obesity treatment plan. She is following her eating plan approximately 80 % of the time and states she is exercising 0 minutes 0 times per week. Monique Gonzalez is frustrated that she isn't losing weight fast enough but is deviating from her plan for dinner and for weekends. Her weight is 264 lb (119.7 kg) today and has had a weight loss of 1 pound over a period of 2 weeks since her last visit. She has lost 13 lbs since starting treatment with [name].  Vitamin D deficiency [name] has a diagnosis of vitamin D deficiency. She is currently stable on vit D, not yet at goal. She denies nausea, vomiting or muscle weakness.  Wt Readings from Last 500 Encounters:  06/03/16 264 lb (119.7 kg)  05/21/16 265 lb (120.2 kg)  05/06/16 268 lb (121.6 kg)  04/22/16 270 lb (122.5 kg)  04/08/16 274 lb (124.3 kg)  03/25/16 277 lb (125.6 kg)  01/07/16 278 lb (126.1 kg)  03/22/15 268 lb (121.6 kg)  12/13/14 260 lb (117.9 kg)  08/08/14 240 lb (108.9 kg)  08/07/14 240 lb (108.9 kg)  12/30/13 231 lb (104.8 kg)  12/23/13 231 lb 9.6 oz (105.1 kg)  11/17/13 228 lb (103.4 kg)  11/01/13 223 lb (101.2 kg)  10/27/13 220 lb (99.8 kg)  10/10/13 226 lb 6.6 oz (102.7 kg)  10/09/13 230 lb (104.3 kg)  10/03/13 230 lb (104.3 kg)  09/29/13 233 lb (105.7 kg)  09/17/13 241 lb 6.4 oz (109.5 kg)  06/15/13 228 lb (103.4 kg)  04/05/13 225 lb (102.1 kg)  11/30/12 222 lb (100.7 kg)  11/22/12 216 lb (98 kg)  11/18/11 209 lb (94.8 kg)     ALLERGIES: Allergies  Allergen Reactions  . Penicillins Rash  . Sulfa Antibiotics Rash    MEDICATIONS: Current Outpatient Prescriptions on File Prior to Visit  Medication Sig Dispense Refill  . acetaminophen (TYLENOL) 650 MG CR tablet Take 1,300 mg by mouth at bedtime.    Marland Kitchen acyclovir cream (ZOVIRAX) 5 % Apply 1 application topically every 3 (three) hours. (Patient taking  differently: Apply 1 application topically every 3 (three) hours as needed (outbreak). ) 15 g 3  . atorvastatin (LIPITOR) 40 MG tablet Take 1 tablet (40 mg total) by mouth daily. 90 tablet 3  . buPROPion (WELLBUTRIN SR) 150 MG 12 hr tablet Take 1 tablet (150 mg total) by mouth daily. 30 tablet 0  . citalopram (CELEXA) 20 MG tablet Take 1 tablet (20 mg total) by mouth daily. 90 tablet 3  . cyclobenzaprine (FLEXERIL) 10 MG tablet Take 1 tablet (10 mg total) by mouth 3 (three) times daily as needed for muscle spasms. 60 tablet 5  . fluticasone (FLONASE) 50 MCG/ACT nasal spray Place 2 sprays into both nostrils daily. 16 g 6  . loratadine (CLARITIN) 10 MG tablet Take 10 mg by mouth daily.      . meloxicam (MOBIC) 15 MG tablet TAKE 1 TABLET (15 MG TOTAL) BY MOUTH DAILY. 90 tablet 3  . Multiple Vitamins-Minerals (MULTIVITAMIN,TX-MINERALS) tablet Take 1 tablet by mouth daily.      . ondansetron (ZOFRAN) 8 MG tablet Take 1 tablet (8 mg total) by mouth 3 (three) times daily. 60 tablet 0  . valACYclovir (VALTREX) 1000 MG tablet TAKE 2 TABLETS BY MOUTH EVERY 12 HOURS FOR 2 DOSES AS NEEDED FOR FLARE 30 tablet 11  No current facility-administered medications on file prior to visit.     PAST MEDICAL HISTORY: Past Medical History:  Diagnosis Date  . Allergy   . Chronic pain    back and knee  . Colitis 09/2012   infectious vs inflammatory.   . Colon stricture   . Depression   . Hyperlipidemia   . Obesity   . Osteoarthritis   . Osteopenia   . Recurrent cold sores   . Seasonal allergies   . Syncope and collapse 11/30/2012   Normal EEG.  Vaso vagal syncope    PAST SURGICAL HISTORY: Past Surgical History:  Procedure Laterality Date  . COLON RESECTION N/A 10/14/2013   Procedure: LAPAROSCOPIC MOBILIZATION OF SPLENIC FLEXURE, LAPAROSCOPIC EXTENDED LEFT COLECTOMY;  Surgeon: [name], MD;  Location: [place];  Service: General;  Laterality: N/A;  . COLONOSCOPY N/A 10/12/2013   Procedure: COLONOSCOPY;   Surgeon: [name], MD;  [facility] ENDOSCOPY;  Service: Endoscopy;  Laterality: N/A;  . DILATION AND CURETTAGE, DIAGNOSTIC / THERAPEUTIC  1987  . OPEN REDUCTION INTERNAL FIXATION (ORIF) DISTAL RADIAL FRACTURE Right 08/08/2014   Procedure: OPEN REDUCTION INTERNAL FIXATION (ORIF) DISTAL RADIAL FRACTURE;  Surgeon: Roseanne Kaufman, MD;  Location: [place];  Service: Orthopedics;  Laterality: Right;  DVR Crosslock, MD available sometime around 1430-1500  . TONSILLECTOMY      SOCIAL HISTORY: Social History  Substance Use Topics  . Smoking status: Former Smoker    Quit date: 10/27/1993  . Smokeless tobacco: Never Used  . Alcohol use Yes     Comment: one drink per week    FAMILY HISTORY: Family History  Problem Relation Age of Onset  . Stroke Mother   . Arthritis Mother   . Thyroid disease Mother   . Cancer Father   . Arthritis Sister   . Thyroid disease Sister   . Cancer Maternal Grandmother   . Stroke Maternal Grandfather   . Cancer Maternal Grandfather   . Colon cancer Maternal Grandfather   . Cancer Paternal Grandmother   . Heart disease Paternal Grandmother   . Colon cancer Paternal Grandmother   . Stroke Paternal Grandfather   . Arthritis Sister   . Obesity Sister   . Sudden death Neg Hx   . Hypertension Neg Hx   . Hyperlipidemia Neg Hx   . Heart attack Neg Hx   . Diabetes Neg Hx   . Rectal cancer Neg Hx   . Stomach cancer Neg Hx   . Esophageal cancer Neg Hx     ROS: Review of Systems  Constitutional: Positive for weight loss.  Gastrointestinal: Negative for nausea and vomiting.  Musculoskeletal:       Negative muscle weakness    PHYSICAL EXAM: Blood pressure 130/82, pulse 84, temperature 97.7 F (36.5 C), temperature source Oral, height 6' (1.829 m), weight 264 lb (119.7 kg), SpO2 94 %. Body mass index is 35.8 kg/m. Physical Exam  Constitutional: She is oriented to person, place, and time. She appears well-developed and well-nourished.  Cardiovascular:  Normal rate.   Pulmonary/Chest: Effort normal.  Musculoskeletal: Normal range of motion.  Neurological: She is oriented to person, place, and time.  Skin: Skin is warm and dry.  Psychiatric: She has a normal mood and affect. Her behavior is normal.  Vitals reviewed.   RECENT LABS AND TESTS: BMET    Component Value Date/Time   NA 142 03/25/2016 1341   K 4.6 03/25/2016 1341   CL 101 03/25/2016 1341   CO2 26  03/25/2016 1341   GLUCOSE 94 03/25/2016 1341   GLUCOSE 79 03/20/2015 1420   BUN 16 03/25/2016 1341   CREATININE 0.79 03/25/2016 1341   CREATININE 0.82 03/20/2015 1420   CALCIUM 9.3 03/25/2016 1341   GFRNONAA 80 03/25/2016 1341   GFRNONAA 77 03/20/2015 1420   GFRAA 93 03/25/2016 1341   GFRAA 89 03/20/2015 1420   Lab Results  Component Value Date   HGBA1C 5.3 03/25/2016   HGBA1C (H) 09/11/2009    5.7 (NOTE)                                                                       According to the ADA Clinical Practice Recommendations for 2011, when HbA1c is used as a screening test:   >=6.5%   Diagnostic of Diabetes Mellitus           (if abnormal result  is confirmed)  5.7-6.4%   Increased risk of developing Diabetes Mellitus  References:Diagnosis and Classification of Diabetes Mellitus,Diabetes BJSE,8315,17(OHYWV 1):S62-S69 and Standards of Medical Care in         Diabetes - 2011,Diabetes PXTG,6269,48  (Suppl 1):S11-S61.   Lab Results  Component Value Date   INSULIN 5.5 03/25/2016   CBC    Component Value Date/Time   WBC 5.2 03/25/2016 1341   WBC 4.1 03/20/2015 1420   RBC 4.22 03/25/2016 1341   RBC 4.21 03/20/2015 1420   HGB 12.6 03/20/2015 1420   HCT 37.2 03/25/2016 1341   PLT 192 03/20/2015 1420   MCV 88 03/25/2016 1341   MCH 29.6 03/25/2016 1341   MCH 29.9 03/20/2015 1420   MCHC 33.6 03/25/2016 1341   MCHC 34.1 03/20/2015 1420   RDW 14.0 03/25/2016 1341   LYMPHSABS 1.2 03/25/2016 1341   MONOABS 0.5 03/20/2015 1420   EOSABS 0.0 03/25/2016 1341   BASOSABS 0.0  03/25/2016 1341   Iron/TIBC/Ferritin/ %Sat No results found for: IRON, TIBC, FERRITIN, IRONPCTSAT Lipid Panel     Component Value Date/Time   CHOL 172 03/25/2016 1341   TRIG 145 03/25/2016 1341   HDL 59 03/25/2016 1341   CHOLHDL 5.0 03/20/2015 1420   VLDL 35 (H) 03/20/2015 1420   LDLCALC 84 03/25/2016 1341   Hepatic Function Panel     Component Value Date/Time   PROT 6.5 03/25/2016 1341   ALBUMIN 4.2 03/25/2016 1341   AST 16 03/25/2016 1341   ALT 14 03/25/2016 1341   ALKPHOS 72 03/25/2016 1341   BILITOT 0.4 03/25/2016 1341      Component Value Date/Time   TSH 1.420 03/25/2016 1341   TSH 1.530 03/20/2015 1420   TSH 2.427 12/04/2014 0908    ASSESSMENT AND PLAN: Vitamin D deficiency - Plan: Vitamin D, Ergocalciferol, (DRISDOL) 50000 units CAPS capsule  Class 2 obesity without serious comorbidity with body mass index (BMI) of 35.0 to 35.9 in adult, unspecified obesity type  PLAN:  Vitamin D Deficiency Georgeanne was informed that low vitamin D levels contributes to fatigue and are associated with obesity, breast, and colon cancer. She agrees to continue to take prescription Vit D @50 ,000 IU every week, we will refill for 1 month and will re-check labs in 4 weeks and will follow up for routine testing of vitamin D, at least 2-3 times  per year. She was informed of the risk of over-replacement of vitamin D and agrees to not increase her dose unless he discusses this with [name] first.  Obesity Bellamy is currently in the action stage of change. As such, her goal is to continue with weight loss efforts She has agreed to change to keep a food journal with 1200 to 1500 calories and 75+ grams ofprotein daily Belvia has been instructed to work up to a goal of 150 minutes of combined cardio and strengthening exercise per week for weight loss and overall health benefits. We discussed the following Behavioral Modification Stratagies today: increasing lean protein intake, increasing lower sugar fruits,  work on meal planning and easy cooking plans and ways to avoid boredom eating  Jameshia has agreed to follow up with our clinic in 2 weeks. She was informed of the importance of frequent follow up visits to maximize her success with intensive lifestyle modifications for her multiple health conditions.  I, Doreene Nest, am acting as scribe for Dennard Nip, MD  I have reviewed the above documentation for accuracy and completeness, and I agree with the above. -Dennard Nip, MD

## 2016-06-10 ENCOUNTER — Ambulatory Visit (INDEPENDENT_AMBULATORY_CARE_PROVIDER_SITE_OTHER): Payer: 59 | Admitting: Family Medicine

## 2016-06-19 ENCOUNTER — Ambulatory Visit (INDEPENDENT_AMBULATORY_CARE_PROVIDER_SITE_OTHER): Payer: 59 | Admitting: Family Medicine

## 2016-06-19 VITALS — BP 146/90 | HR 74 | Temp 98.6°F | Ht 72.0 in | Wt 263.0 lb

## 2016-06-19 DIAGNOSIS — E559 Vitamin D deficiency, unspecified: Secondary | ICD-10-CM

## 2016-06-19 DIAGNOSIS — E669 Obesity, unspecified: Secondary | ICD-10-CM | POA: Diagnosis not present

## 2016-06-19 DIAGNOSIS — F3289 Other specified depressive episodes: Secondary | ICD-10-CM

## 2016-06-19 DIAGNOSIS — Z6835 Body mass index (BMI) 35.0-35.9, adult: Secondary | ICD-10-CM

## 2016-06-19 MED ORDER — BUPROPION HCL ER (SR) 150 MG PO TB12: 150.0000 mg | ORAL_TABLET | Freq: Every day | ORAL | 0 refills | Status: DC

## 2016-06-19 MED ORDER — VITAMIN D (ERGOCALCIFEROL) 1.25 MG (50000 UNIT) PO CAPS: 50000.0000 [IU] | ORAL_CAPSULE | ORAL | 0 refills | Status: DC

## 2016-06-19 MED FILL — BUPROPION SR 150 MG TABLET: 150 | 30 days supply | Qty: 30 | Fill #0

## 2016-06-19 NOTE — Progress Notes (Signed)
Office: [phone]  /  Fax: [phone]   HPI:   Chief Complaint: OBESITY Monique Gonzalez is here to discuss her progress with her obesity treatment plan. She is following her eating plan approximately 70 % of the time and states she is exercising 0 minutes 0 times per week. Monique Gonzalez continues to do well with weight loss but has had decreased planning ahead due to hectic work schedule. She does note increased cravings with increased work stress. Her weight is 263 lb (119.3 kg) today and has had a weight loss of 1 pound over a period of 2 weeks since her last visit. She has lost 14 lbs since starting treatment with [name].  Depression with emotional eating behaviors Monique Gonzalez is struggling with emotional eating and using food for comfort to the extent that it is negatively impacting her health. She often snacks when she is not hungry. Monique Gonzalez sometimes feels she is out of control and then feels guilty that she made poor food choices. She started on wellbutrin and notes improved mood, no insomnia, blood pressure is stable and she is feeling more in control of emotional eating. She has been working on behavior modification techniques to help reduce her emotional eating and has been somewhat successful. She shows no sign of suicidal or homicidal ideations.  Vitamin D deficiency Monique Gonzalez has a diagnosis of vitamin D deficiency. She is currently stable on vit D, not yet at goal and denies nausea, vomiting or muscle weakness.  Depression screen [facility] 2/9 03/25/2016 11/22/2012  Decreased Interest 3 0  Down, Depressed, Hopeless 3 0  PHQ - 2 Score 6 0  Altered sleeping 3 -  Tired, decreased energy 3 -  Change in appetite 3 -  Feeling bad or failure about yourself  3 -  Trouble concentrating 3 -  Moving slowly or fidgety/restless 3 -  Suicidal thoughts 0 -  PHQ-9 Score 24 -     Wt Readings from Last 500 Encounters:  06/19/16 263 lb (119.3 kg)  06/03/16 264 lb (119.7 kg)  05/21/16 265 lb (120.2 kg)  05/06/16 268 lb (121.6 kg)   04/22/16 270 lb (122.5 kg)  04/08/16 274 lb (124.3 kg)  03/25/16 277 lb (125.6 kg)  01/07/16 278 lb (126.1 kg)  03/22/15 268 lb (121.6 kg)  12/13/14 260 lb (117.9 kg)  08/08/14 240 lb (108.9 kg)  08/07/14 240 lb (108.9 kg)  12/30/13 231 lb (104.8 kg)  12/23/13 231 lb 9.6 oz (105.1 kg)  11/17/13 228 lb (103.4 kg)  11/01/13 223 lb (101.2 kg)  10/27/13 220 lb (99.8 kg)  10/10/13 226 lb 6.6 oz (102.7 kg)  10/09/13 230 lb (104.3 kg)  10/03/13 230 lb (104.3 kg)  09/29/13 233 lb (105.7 kg)  09/17/13 241 lb 6.4 oz (109.5 kg)  06/15/13 228 lb (103.4 kg)  04/05/13 225 lb (102.1 kg)  11/30/12 222 lb (100.7 kg)  11/22/12 216 lb (98 kg)  11/18/11 209 lb (94.8 kg)     ALLERGIES: Allergies  Allergen Reactions  . Penicillins Rash  . Sulfa Antibiotics Rash    MEDICATIONS: Current Outpatient Prescriptions on File Prior to Visit  Medication Sig Dispense Refill  . acetaminophen (TYLENOL) 650 MG CR tablet Take 1,300 mg by mouth at bedtime.    Marland Kitchen acyclovir cream (ZOVIRAX) 5 % Apply 1 application topically every 3 (three) hours. (Patient taking differently: Apply 1 application topically every 3 (three) hours as needed (outbreak). ) 15 g 3  . atorvastatin (LIPITOR) 40 MG tablet Take 1 tablet (40  mg total) by mouth daily. 90 tablet 3  . citalopram (CELEXA) 20 MG tablet Take 1 tablet (20 mg total) by mouth daily. 90 tablet 3  . cyclobenzaprine (FLEXERIL) 10 MG tablet Take 1 tablet (10 mg total) by mouth 3 (three) times daily as needed for muscle spasms. 60 tablet 5  . fluticasone (FLONASE) 50 MCG/ACT nasal spray Place 2 sprays into both nostrils daily. 16 g 6  . loratadine (CLARITIN) 10 MG tablet Take 10 mg by mouth daily.      . meloxicam (MOBIC) 15 MG tablet TAKE 1 TABLET (15 MG TOTAL) BY MOUTH DAILY. 90 tablet 3  . Multiple Vitamins-Minerals (MULTIVITAMIN,TX-MINERALS) tablet Take 1 tablet by mouth daily.      . ondansetron (ZOFRAN) 8 MG tablet Take 1 tablet (8 mg total) by mouth 3 (three)  times daily. 60 tablet 0  . valACYclovir (VALTREX) 1000 MG tablet TAKE 2 TABLETS BY MOUTH EVERY 12 HOURS FOR 2 DOSES AS NEEDED FOR FLARE 30 tablet 11   No current facility-administered medications on file prior to visit.     PAST MEDICAL HISTORY: Past Medical History:  Diagnosis Date  . Allergy   . Chronic pain    back and knee  . Colitis 09/2012   infectious vs inflammatory.   . Colon stricture   . Depression   . Hyperlipidemia   . Obesity   . Osteoarthritis   . Osteopenia   . Recurrent cold sores   . Seasonal allergies   . Syncope and collapse 11/30/2012   Normal EEG.  Vaso vagal syncope    PAST SURGICAL HISTORY: Past Surgical History:  Procedure Laterality Date  . COLON RESECTION N/A 10/14/2013   Procedure: LAPAROSCOPIC MOBILIZATION OF SPLENIC FLEXURE, LAPAROSCOPIC EXTENDED LEFT COLECTOMY;  Surgeon: [name], MD;  Location: [place];  Service: General;  Laterality: N/A;  . COLONOSCOPY N/A 10/12/2013   Procedure: COLONOSCOPY;  Surgeon: [name], MD;  Location: [facility] END[facility]COPY;  Service: Endoscopy;  Laterality: N/A;  . DILATION AND CURETTAGE, DIAGNOSTIC / THERAPEUTIC  1987  . OPEN REDUCTION INTERNAL FIXATION (ORIF) DISTAL RADIAL FRACTURE Right 08/08/2014   Procedure: OPEN REDUCTION INTERNAL FIXATION (ORIF) DISTAL RADIAL FRACTURE;  Surgeon: Roseanne Kaufman, MD;  Location: [place];  Service: Orthopedics;  Laterality: Right;  DVR Crosslock, MD available sometime around 1430-1500  . TONSILLECTOMY      SOCIAL HISTORY: Social History  Substance Use Topics  . Smoking status: Former Smoker    Quit date: 10/27/1993  . Smokeless tobacco: Never Used  . Alcohol use Yes     Comment: one drink per week    FAMILY HISTORY: Family History  Problem Relation Age of Onset  . Stroke Mother   . Arthritis Mother   . Thyroid disease Mother   . Cancer Father   . Arthritis Sister   . Thyroid disease Sister   . Cancer Maternal Grandmother   . Stroke Maternal Grandfather   . Cancer Maternal  Grandfather   . Colon cancer Maternal Grandfather   . Cancer Paternal Grandmother   . Heart disease Paternal Grandmother   . Colon cancer Paternal Grandmother   . Stroke Paternal Grandfather   . Arthritis Sister   . Obesity Sister   . Sudden death Neg Hx   . Hypertension Neg Hx   . Hyperlipidemia Neg Hx   . Heart attack Neg Hx   . Diabetes Neg Hx   . Rectal cancer Neg Hx   . Stomach cancer Neg Hx   .  Esophageal cancer Neg Hx     ROS: Review of Systems  Constitutional: Positive for malaise/fatigue.  Gastrointestinal: Negative for nausea and vomiting.  Musculoskeletal:       Negative muscle weakness  Endo/Heme/Allergies:       Polyphagia  Psychiatric/Behavioral: Positive for depression. Negative for suicidal ideas. The patient does not have insomnia.        Stress    PHYSICAL EXAM: Blood pressure (!) 146/90, pulse 74, temperature 98.6 F (37 C), temperature source Oral, height 6' (1.829 m), weight 263 lb (119.3 kg), SpO2 92 %. Body mass index is 35.67 kg/m. Physical Exam  Constitutional: She is oriented to person, place, and time. She appears well-developed and well-nourished.  Cardiovascular: Normal rate.   Pulmonary/Chest: Effort normal.  Musculoskeletal: Normal range of motion.  Neurological: She is oriented to person, place, and time.  Skin: Skin is warm and dry.  Vitals reviewed.   RECENT LABS AND TESTS: BMET    Component Value Date/Time   NA 142 03/25/2016 1341   K 4.6 03/25/2016 1341   CL 101 03/25/2016 1341   CO2 26 03/25/2016 1341   GLUCOSE 94 03/25/2016 1341   GLUCOSE 79 03/20/2015 1420   BUN 16 03/25/2016 1341   CREATININE 0.79 03/25/2016 1341   CREATININE 0.82 03/20/2015 1420   CALCIUM 9.3 03/25/2016 1341   GFRNONAA 80 03/25/2016 1341   GFRNONAA 77 03/20/2015 1420   GFRAA 93 03/25/2016 1341   GFRAA 89 03/20/2015 1420   Lab Results  Component Value Date   HGBA1C 5.3 03/25/2016   HGBA1C (H) 09/11/2009    5.7 (NOTE)                                                                        According to the ADA Clinical Practice Recommendations for 2011, when HbA1c is used as a screening test:   >=6.5%   Diagnostic of Diabetes Mellitus           (if abnormal result  is confirmed)  5.7-6.4%   Increased risk of developing Diabetes Mellitus  References:Diagnosis and Classification of Diabetes Mellitus,Diabetes DJME,2683,41(DQQIW 1):S62-S69 and Standards of Medical Care in         Diabetes - 2011,Diabetes LNLG,9211,94  (Suppl 1):S11-S61.   Lab Results  Component Value Date   INSULIN 5.5 03/25/2016   CBC    Component Value Date/Time   WBC 5.2 03/25/2016 1341   WBC 4.1 03/20/2015 1420   RBC 4.22 03/25/2016 1341   RBC 4.21 03/20/2015 1420   HGB 12.6 03/20/2015 1420   HCT 37.2 03/25/2016 1341   PLT 192 03/20/2015 1420   MCV 88 03/25/2016 1341   MCH 29.6 03/25/2016 1341   MCH 29.9 03/20/2015 1420   MCHC 33.6 03/25/2016 1341   MCHC 34.1 03/20/2015 1420   RDW 14.0 03/25/2016 1341   LYMPHSABS 1.2 03/25/2016 1341   MONOABS 0.5 03/20/2015 1420   EOSABS 0.0 03/25/2016 1341   BASOSABS 0.0 03/25/2016 1341   Iron/TIBC/Ferritin/ %Sat No results found for: IRON, TIBC, FERRITIN, IRONPCTSAT Lipid Panel     Component Value Date/Time   CHOL 172 03/25/2016 1341   TRIG 145 03/25/2016 1341   HDL 59 03/25/2016 1341   CHOLHDL 5.0 03/20/2015 1420  VLDL 35 (H) 03/20/2015 1420   LDLCALC 84 03/25/2016 1341   Hepatic Function Panel     Component Value Date/Time   PROT 6.5 03/25/2016 1341   ALBUMIN 4.2 03/25/2016 1341   AST 16 03/25/2016 1341   ALT 14 03/25/2016 1341   ALKPHOS 72 03/25/2016 1341   BILITOT 0.4 03/25/2016 1341      Component Value Date/Time   TSH 1.420 03/25/2016 1341   TSH 1.530 03/20/2015 1420   TSH 2.427 12/04/2014 0908    ASSESSMENT AND PLAN: Other depression - Plan: buPROPion (WELLBUTRIN SR) 150 MG 12 hr tablet  Vitamin D deficiency - Plan: Vitamin D, Ergocalciferol, (DRISDOL) 50000 units CAPS capsule  Class 2  obesity without serious comorbidity with body mass index (BMI) of 35.0 to 35.9 in adult, unspecified obesity type  PLAN:  Depression with Emotional Eating Behaviors We discussed behavior modification techniques today to help Monique Gonzalez deal with her emotional eating and depression. She has agreed to continue to take Wellbutrin SR 150 mg qd, we will refill for 1 month and agreed to follow up as directed.  Vitamin D Deficiency Monique Gonzalez was informed that low vitamin D levels contributes to fatigue and are associated with obesity, breast, and colon cancer. She agrees to continue to take prescription Vit D @50 ,000 IU every week, we will refill for 1 month and will follow up for routine testing of vitamin D, at least 2-3 times per year. She was informed of the risk of over-replacement of vitamin D and agrees to not increase her dose unless he discusses this with [name] first. Monique Gonzalez agrees to follow up with our clinic in 2 weeks.  Obesity Monique Gonzalez is currently in the action stage of change. As such, her goal is to continue with weight loss efforts She has agreed to keep a food journal with 1200 to 1500 calories and 75+ grams protein daily Monique Gonzalez has been instructed to work up to a goal of 150 minutes of combined cardio and strengthening exercise per week for weight loss and overall health benefits. We discussed the following Behavioral Modification Stratagies today: increasing lean protein intake, decreasing simple carbohydrates , work on meal planning and easy cooking plans, dealing with family or coworker sabotage and emotional eating strategies  Monique Gonzalez has agreed to follow up with our clinic in 2 to 3 weeks. She was informed of the importance of frequent follow up visits to maximize her success with intensive lifestyle modifications for her multiple health conditions.  I, Doreene Nest, am acting as scribe for Dennard Nip, MD  I have reviewed the above documentation for accuracy and completeness, and I agree with the above.  -Dennard Nip, MD

## 2016-07-09 ENCOUNTER — Ambulatory Visit (INDEPENDENT_AMBULATORY_CARE_PROVIDER_SITE_OTHER): Payer: 59 | Admitting: Family Medicine

## 2016-07-09 VITALS — BP 115/73 | HR 75 | Temp 97.7°F | Ht 72.0 in | Wt 263.0 lb

## 2016-07-09 DIAGNOSIS — E669 Obesity, unspecified: Secondary | ICD-10-CM

## 2016-07-09 DIAGNOSIS — F3289 Other specified depressive episodes: Secondary | ICD-10-CM | POA: Diagnosis not present

## 2016-07-09 DIAGNOSIS — Z6835 Body mass index (BMI) 35.0-35.9, adult: Secondary | ICD-10-CM

## 2016-07-09 MED ORDER — BUPROPION HCL ER (SR) 200 MG PO TB12: 200.0000 mg | ORAL_TABLET | Freq: Every day | ORAL | 0 refills | Status: DC

## 2016-07-09 NOTE — Progress Notes (Signed)
Office: [phone]  /  Fax: [phone]   HPI:   Chief Complaint: OBESITY Monique Gonzalez is here to discuss her progress with her obesity treatment plan. She is on the keep a food journal with 1200 to 1500 calories and 75 grams of protein daily and is following her eating plan approximately 85 % of the time. She states she is exercising 0 minutes 0 times per week. Monique Gonzalez has done well maintaining weight. She is unable to exercise due to knee pain and is getting ready to have knee surgery. She is still struggling with stress eating. Her weight is 263 lb (119.3 kg) today and has maintained weight over a period of 3 weeks since her last visit. She has lost 14 lbs since starting treatment with [name].  Depression with emotional eating behaviors Monique Gonzalez is struggling with emotional eating and using food for comfort to the extent that it is negatively impacting her health. She often snacks when she is not hungry. Monique Gonzalez sometimes feels she is out of control and then feels guilty that she made poor food choices. She is on wellbutrin, mood is stable but she is still struggling with stress eating. She has been working on behavior modification techniques to help reduce her emotional eating and has been somewhat successful. She shows no sign of suicidal or homicidal ideations.  Depression screen [facility] 2/9 03/25/2016 11/22/2012  Decreased Interest 3 0  Down, Depressed, Hopeless 3 0  PHQ - 2 Score 6 0  Altered sleeping 3 -  Tired, decreased energy 3 -  Change in appetite 3 -  Feeling bad or failure about yourself  3 -  Trouble concentrating 3 -  Moving slowly or fidgety/restless 3 -  Suicidal thoughts 0 -  PHQ-9 Score 24 -     Wt Readings from Last 500 Encounters:  07/09/16 263 lb (119.3 kg)  06/19/16 263 lb (119.3 kg)  06/03/16 264 lb (119.7 kg)  05/21/16 265 lb (120.2 kg)  05/06/16 268 lb (121.6 kg)  04/22/16 270 lb (122.5 kg)  04/08/16 274 lb (124.3 kg)  03/25/16 277 lb (125.6 kg)  01/07/16 278 lb (126.1 kg)    03/22/15 268 lb (121.6 kg)  12/13/14 260 lb (117.9 kg)  08/08/14 240 lb (108.9 kg)  08/07/14 240 lb (108.9 kg)  12/30/13 231 lb (104.8 kg)  12/23/13 231 lb 9.6 oz (105.1 kg)  11/17/13 228 lb (103.4 kg)  11/01/13 223 lb (101.2 kg)  10/27/13 220 lb (99.8 kg)  10/10/13 226 lb 6.6 oz (102.7 kg)  10/09/13 230 lb (104.3 kg)  10/03/13 230 lb (104.3 kg)  09/29/13 233 lb (105.7 kg)  09/17/13 241 lb 6.4 oz (109.5 kg)  06/15/13 228 lb (103.4 kg)  04/05/13 225 lb (102.1 kg)  11/30/12 222 lb (100.7 kg)  11/22/12 216 lb (98 kg)  11/18/11 209 lb (94.8 kg)     ALLERGIES: Allergies  Allergen Reactions  . Penicillins Rash  . Sulfa Antibiotics Rash    MEDICATIONS: Current Outpatient Prescriptions on File Prior to Visit  Medication Sig Dispense Refill  . acetaminophen (TYLENOL) 650 MG CR tablet Take 1,300 mg by mouth at bedtime.    Marland Kitchen acyclovir cream (ZOVIRAX) 5 % Apply 1 application topically every 3 (three) hours. (Patient taking differently: Apply 1 application topically every 3 (three) hours as needed (outbreak). ) 15 g 3  . atorvastatin (LIPITOR) 40 MG tablet Take 1 tablet (40 mg total) by mouth daily. 90 tablet 3  . buPROPion (WELLBUTRIN SR) 150 MG 12  hr tablet Take 1 tablet (150 mg total) by mouth daily. 30 tablet 0  . citalopram (CELEXA) 20 MG tablet Take 1 tablet (20 mg total) by mouth daily. 90 tablet 3  . cyclobenzaprine (FLEXERIL) 10 MG tablet Take 1 tablet (10 mg total) by mouth 3 (three) times daily as needed for muscle spasms. 60 tablet 5  . fluticasone (FLONASE) 50 MCG/ACT nasal spray Place 2 sprays into both nostrils daily. 16 g 6  . loratadine (CLARITIN) 10 MG tablet Take 10 mg by mouth daily.      . meloxicam (MOBIC) 15 MG tablet TAKE 1 TABLET (15 MG TOTAL) BY MOUTH DAILY. 90 tablet 3  . Multiple Vitamins-Minerals (MULTIVITAMIN,TX-MINERALS) tablet Take 1 tablet by mouth daily.      . ondansetron (ZOFRAN) 8 MG tablet Take 1 tablet (8 mg total) by mouth 3 (three) times daily.  60 tablet 0  . valACYclovir (VALTREX) 1000 MG tablet TAKE 2 TABLETS BY MOUTH EVERY 12 HOURS FOR 2 DOSES AS NEEDED FOR FLARE 30 tablet 11  . Vitamin D, Ergocalciferol, (DRISDOL) 50000 units CAPS capsule Take 1 capsule (50,000 Units total) by mouth every 7 (seven) days. 4 capsule 0   No current facility-administered medications on file prior to visit.     PAST MEDICAL HISTORY: Past Medical History:  Diagnosis Date  . Allergy   . Chronic pain    back and knee  . Colitis 09/2012   infectious vs inflammatory.   . Colon stricture   . Depression   . Hyperlipidemia   . Obesity   . Osteoarthritis   . Osteopenia   . Recurrent cold sores   . Seasonal allergies   . Syncope and collapse 11/30/2012   Normal EEG.  Vaso vagal syncope    PAST SURGICAL HISTORY: Past Surgical History:  Procedure Laterality Date  . COLON RESECTION N/A 10/14/2013   Procedure: LAPAROSCOPIC MOBILIZATION OF SPLENIC FLEXURE, LAPAROSCOPIC EXTENDED LEFT COLECTOMY;  Surgeon: [name], MD;  Location: [place];  Service: General;  Laterality: N/A;  . COLONOSCOPY N/A 10/12/2013   Procedure: COLONOSCOPY;  Surgeon: [name], MD;  Location: [facility] ENDOSCOPY;  Service: Endoscopy;  Laterality: N/A;  . DILATION AND CURETTAGE, DIAGNOSTIC / THERAPEUTIC  1987  . OPEN REDUCTION INTERNAL FIXATION (ORIF) DISTAL RADIAL FRACTURE Right 08/08/2014   Procedure: OPEN REDUCTION INTERNAL FIXATION (ORIF) DISTAL RADIAL FRACTURE;  Surgeon: Roseanne Kaufman, MD;  Location: [place];  Service: Orthopedics;  Laterality: Right;  DVR Crosslock, MD available sometime around 1430-1500  . TONSILLECTOMY      SOCIAL HISTORY: Social History  Substance Use Topics  . Smoking status: Former Smoker    Quit date: 10/27/1993  . Smokeless tobacco: Never Used  . Alcohol use Yes     Comment: one drink per week    FAMILY HISTORY: Family History  Problem Relation Age of Onset  . Stroke Mother   . Arthritis Mother   . Thyroid disease Mother   . Cancer Father   .  Arthritis Sister   . Thyroid disease Sister   . Cancer Maternal Grandmother   . Stroke Maternal Grandfather   . Cancer Maternal Grandfather   . Colon cancer Maternal Grandfather   . Cancer Paternal Grandmother   . Heart disease Paternal Grandmother   . Colon cancer Paternal Grandmother   . Stroke Paternal Grandfather   . Arthritis Sister   . Obesity Sister   . Sudden death Neg Hx   . Hypertension Neg Hx   . Hyperlipidemia  Neg Hx   . Heart attack Neg Hx   . Diabetes Neg Hx   . Rectal cancer Neg Hx   . Stomach cancer Neg Hx   . Esophageal cancer Neg Hx     ROS: Review of Systems  Constitutional: Negative for weight loss.  Psychiatric/Behavioral: Positive for depression. Negative for suicidal ideas.       Stress     PHYSICAL EXAM: Blood pressure 115/73, pulse 75, temperature 97.7 F (36.5 C), temperature source Oral, height 6' (1.829 m), weight 263 lb (119.3 kg), SpO2 95 %. Body mass index is 35.67 kg/m. Physical Exam  Constitutional: She is oriented to person, place, and time. She appears well-developed and well-nourished.  Cardiovascular: Normal rate.   Pulmonary/Chest: Effort normal.  Musculoskeletal: Normal range of motion.  Neurological: She is oriented to person, place, and time.  Skin: Skin is warm and dry.  Psychiatric: She has a normal mood and affect. Her behavior is normal.  Vitals reviewed.   RECENT LABS AND TESTS: BMET    Component Value Date/Time   NA 142 03/25/2016 1341   K 4.6 03/25/2016 1341   CL 101 03/25/2016 1341   CO2 26 03/25/2016 1341   GLUCOSE 94 03/25/2016 1341   GLUCOSE 79 03/20/2015 1420   BUN 16 03/25/2016 1341   CREATININE 0.79 03/25/2016 1341   CREATININE 0.82 03/20/2015 1420   CALCIUM 9.3 03/25/2016 1341   GFRNONAA 80 03/25/2016 1341   GFRNONAA 77 03/20/2015 1420   GFRAA 93 03/25/2016 1341   GFRAA 89 03/20/2015 1420   Lab Results  Component Value Date   HGBA1C 5.3 03/25/2016   HGBA1C (H) 09/11/2009    5.7 (NOTE)                                                                        According to the ADA Clinical Practice Recommendations for 2011, when HbA1c is used as a screening test:   >=6.5%   Diagnostic of Diabetes Mellitus           (if abnormal result  is confirmed)  5.7-6.4%   Increased risk of developing Diabetes Mellitus  References:Diagnosis and Classification of Diabetes Mellitus,Diabetes JQBH,4193,79(KWIOX 1):S62-S69 and Standards of Medical Care in         Diabetes - 2011,Diabetes BDZH,2992,42  (Suppl 1):S11-S61.   Lab Results  Component Value Date   INSULIN 5.5 03/25/2016   CBC    Component Value Date/Time   WBC 5.2 03/25/2016 1341   WBC 4.1 03/20/2015 1420   RBC 4.22 03/25/2016 1341   RBC 4.21 03/20/2015 1420   HGB 12.6 03/20/2015 1420   HCT 37.2 03/25/2016 1341   PLT 192 03/20/2015 1420   MCV 88 03/25/2016 1341   MCH 29.6 03/25/2016 1341   MCH 29.9 03/20/2015 1420   MCHC 33.6 03/25/2016 1341   MCHC 34.1 03/20/2015 1420   RDW 14.0 03/25/2016 1341   LYMPHSABS 1.2 03/25/2016 1341   MONOABS 0.5 03/20/2015 1420   EOSABS 0.0 03/25/2016 1341   BASOSABS 0.0 03/25/2016 1341   Iron/TIBC/Ferritin/ %Sat No results found for: IRON, TIBC, FERRITIN, IRONPCTSAT Lipid Panel     Component Value Date/Time   CHOL 172 03/25/2016 1341   TRIG 145 03/25/2016 1341  HDL 59 03/25/2016 1341   CHOLHDL 5.0 03/20/2015 1420   VLDL 35 (H) 03/20/2015 1420   LDLCALC 84 03/25/2016 1341   Hepatic Function Panel     Component Value Date/Time   PROT 6.5 03/25/2016 1341   ALBUMIN 4.2 03/25/2016 1341   AST 16 03/25/2016 1341   ALT 14 03/25/2016 1341   ALKPHOS 72 03/25/2016 1341   BILITOT 0.4 03/25/2016 1341      Component Value Date/Time   TSH 1.420 03/25/2016 1341   TSH 1.530 03/20/2015 1420   TSH 2.427 12/04/2014 0908    ASSESSMENT AND PLAN: Other depression - Plan: buPROPion (WELLBUTRIN SR) 200 MG 12 hr tablet  Class 2 obesity without serious comorbidity with body mass index (BMI) of 35.0 to  35.9 in adult, unspecified obesity type  PLAN:  Depression with Emotional Eating Behaviors We discussed behavior modification techniques today to help Monique Gonzalez deal with her emotional eating and depression. She has agreed to take Wellbutrin SR 200 mg qd #30 with no refills and agreed to follow up as directed.  Obesity Fallon is currently in the action stage of change. As such, her goal is to continue with weight loss efforts She has agreed to change to follow a lower carbohydrate, vegetable and lean protein rich diet plan Surena has been instructed to work up to a goal of 150 minutes of combined cardio and strengthening exercise per week for weight loss and overall health benefits. We discussed the following Behavioral Modification Stratagies today: increasing lean protein intake, decreasing simple carbohydrates  and increasing fiber rich foods  Shelena has agreed to follow up with our clinic in 2 weeks. She was informed of the importance of frequent follow up visits to maximize her success with intensive lifestyle modifications for her multiple health conditions.  I, Doreene Nest, am acting as scribe for Dennard Nip, MD  I have reviewed the above documentation for accuracy and completeness, and I agree with the above. -Dennard Nip, MD

## 2016-07-10 MED FILL — BUPROPION HCL SR 200 MG TAB: 200 | 30 days supply | Qty: 30 | Fill #0

## 2016-07-10 MED FILL — VIT D2 1.25 MG (50,000 UNIT: 1.25 MG | 28 days supply | Qty: 4 | Fill #0

## 2016-07-23 ENCOUNTER — Ambulatory Visit (INDEPENDENT_AMBULATORY_CARE_PROVIDER_SITE_OTHER): Payer: 59 | Admitting: Family Medicine

## 2016-07-23 VITALS — BP 121/77 | HR 67 | Temp 97.9°F | Ht 72.0 in | Wt 258.0 lb

## 2016-07-23 DIAGNOSIS — E669 Obesity, unspecified: Secondary | ICD-10-CM | POA: Diagnosis not present

## 2016-07-23 DIAGNOSIS — Z9189 Other specified personal risk factors, not elsewhere classified: Secondary | ICD-10-CM | POA: Diagnosis not present

## 2016-07-23 DIAGNOSIS — E66812 Obesity, class 2: Secondary | ICD-10-CM | POA: Insufficient documentation

## 2016-07-23 DIAGNOSIS — F3289 Other specified depressive episodes: Secondary | ICD-10-CM | POA: Diagnosis not present

## 2016-07-23 DIAGNOSIS — Z6835 Body mass index (BMI) 35.0-35.9, adult: Secondary | ICD-10-CM | POA: Diagnosis not present

## 2016-07-23 DIAGNOSIS — E559 Vitamin D deficiency, unspecified: Secondary | ICD-10-CM | POA: Diagnosis not present

## 2016-07-23 MED ORDER — VITAMIN D (ERGOCALCIFEROL) 1.25 MG (50000 UNIT) PO CAPS: 50000.0000 [IU] | ORAL_CAPSULE | ORAL | 0 refills | Status: DC

## 2016-07-23 MED ORDER — BUPROPION HCL ER (SR) 200 MG PO TB12: 200.0000 mg | ORAL_TABLET | Freq: Every day | ORAL | 0 refills | Status: DC

## 2016-07-24 NOTE — Progress Notes (Signed)
Office: [phone]  /  Fax: [phone]   HPI:   Chief Complaint: OBESITY Monique Gonzalez is here to discuss her progress with her obesity treatment plan. She is on the  follow a lower carbohydrate, vegetable and lean protein rich diet plan and is following her eating plan approximately 100 % of the time. She states she is exercising 0 minutes 0 times per week. Monique Gonzalez has done well with weight loss on low carbohydrate plan. She is getting ready to have knee surgery in 2 weeks and is worried about gaining weight while recovering. Her weight is 258 lb (117 kg) today and has had a weight loss of 5 pounds over a period of 2 weeks since her last visit. She has lost 19 lbs since starting treatment with [name].  Vitamin D deficiency Monique Gonzalez has a diagnosis of vitamin D deficiency. She is currently stable on vit D and denies nausea, vomiting or muscle weakness.  Depression with emotional eating behaviors Monique Gonzalez is struggling with emotional eating and using food for comfort to the extent that it is negatively impacting her health. She often snacks when she is not hungry. Monique Gonzalez sometimes feels she is out of control and then feels guilty that she made poor food choices. She has been working on behavior modification techniques to help reduce her emotional eating and has been somewhat successful. Her mood is stable and she shows no sign of suicidal or homicidal ideations.  At risk for cardiovascular disease Monique Gonzalez is at a higher than average risk for cardiovascular disease due to obesity. She currently denies any chest pain.  Depression screen Monique Gonzalez 2/9 03/25/2016 11/22/2012  Decreased Interest 3 0  Down, Depressed, Hopeless 3 0  PHQ - 2 Score 6 0  Altered sleeping 3 -  Tired, decreased energy 3 -  Change in appetite 3 -  Feeling bad or failure about yourself  3 -  Trouble concentrating 3 -  Moving slowly or fidgety/restless 3 -  Suicidal thoughts 0 -  PHQ-9 Score 24 -      ALLERGIES: Allergies  Allergen Reactions  .  Penicillins Rash  . Sulfa Antibiotics Rash    MEDICATIONS: Current Outpatient Prescriptions on File Prior to Visit  Medication Sig Dispense Refill  . acetaminophen (TYLENOL) 650 MG CR tablet Take 1,300 mg by mouth at bedtime.    Marland Kitchen acyclovir cream (ZOVIRAX) 5 % Apply 1 application topically every 3 (three) hours. (Patient taking differently: Apply 1 application topically every 3 (three) hours as needed (outbreak). ) 15 g 3  . atorvastatin (LIPITOR) 40 MG tablet Take 1 tablet (40 mg total) by mouth daily. 90 tablet 3  . citalopram (CELEXA) 20 MG tablet Take 1 tablet (20 mg total) by mouth daily. 90 tablet 3  . cyclobenzaprine (FLEXERIL) 10 MG tablet Take 1 tablet (10 mg total) by mouth 3 (three) times daily as needed for muscle spasms. 60 tablet 5  . fluticasone (FLONASE) 50 MCG/ACT nasal spray Place 2 sprays into both nostrils daily. 16 g 6  . loratadine (CLARITIN) 10 MG tablet Take 10 mg by mouth daily.      . meloxicam (MOBIC) 15 MG tablet TAKE 1 TABLET (15 MG TOTAL) BY MOUTH DAILY. 90 tablet 3  . ondansetron (ZOFRAN) 8 MG tablet Take 1 tablet (8 mg total) by mouth 3 (three) times daily. 60 tablet 0  . valACYclovir (VALTREX) 1000 MG tablet TAKE 2 TABLETS BY MOUTH EVERY 12 HOURS FOR 2 DOSES AS NEEDED FOR FLARE 30 tablet 11  .  Multiple Vitamins-Minerals (MULTIVITAMIN,TX-MINERALS) tablet Take 1 tablet by mouth daily.       No current facility-administered medications on file prior to visit.     PAST MEDICAL HISTORY: Past Medical History:  Diagnosis Date  . Allergy   . Chronic pain    back and knee  . Colitis 09/2012   infectious vs inflammatory.   . Colon stricture   . Depression   . Hyperlipidemia   . Obesity   . Osteoarthritis   . Osteopenia   . Recurrent cold sores   . Seasonal allergies   . Syncope and collapse 11/30/2012   Normal EEG.  Vaso vagal syncope    PAST SURGICAL HISTORY: Past Surgical History:  Procedure Laterality Date  . COLON RESECTION N/A 10/14/2013    Procedure: LAPAROSCOPIC MOBILIZATION OF SPLENIC FLEXURE, LAPAROSCOPIC EXTENDED LEFT COLECTOMY;  Surgeon: [name], MD;  Location: [facility];  Service: General;  Laterality: N/A;  . COLONOSCOPY N/A 10/12/2013   Procedure: COLONOSCOPY;  Surgeon: [name], MD;  Location: [facility];  Service: Endoscopy;  Laterality: N/A;  . DILATION AND CURETTAGE, DIAGNOSTIC / THERAPEUTIC  1987  . OPEN REDUCTION INTERNAL FIXATION (ORIF) DISTAL RADIAL FRACTURE Right 08/08/2014   Procedure: OPEN REDUCTION INTERNAL FIXATION (ORIF) DISTAL RADIAL FRACTURE;  Surgeon: Roseanne Kaufman, MD;  Location: [facility];  Service: Orthopedics;  Laterality: Right;  DVR Crosslock, MD available sometime around 1430-1500  . TONSILLECTOMY      SOCIAL HISTORY: Social History  Substance Use Topics  . Smoking status: Former Smoker    Quit date: 10/27/1993  . Smokeless tobacco: Never Used  . Alcohol use Yes     Comment: one drink per week    FAMILY HISTORY: Family History  Problem Relation Age of Onset  . Stroke Mother   . Arthritis Mother   . Thyroid disease Mother   . Cancer Father   . Arthritis Sister   . Thyroid disease Sister   . Cancer Maternal Grandmother   . Stroke Maternal Grandfather   . Cancer Maternal Grandfather   . Colon cancer Maternal Grandfather   . Cancer Paternal Grandmother   . Heart disease Paternal Grandmother   . Colon cancer Paternal Grandmother   . Stroke Paternal Grandfather   . Arthritis Sister   . Obesity Sister   . Sudden death Neg Hx   . Hypertension Neg Hx   . Hyperlipidemia Neg Hx   . Heart attack Neg Hx   . Diabetes Neg Hx   . Rectal cancer Neg Hx   . Stomach cancer Neg Hx   . Esophageal cancer Neg Hx     ROS: Review of Systems  Constitutional: Positive for weight loss.  Cardiovascular: Negative for chest pain.  Gastrointestinal: Negative for nausea and vomiting.  Musculoskeletal:       Negative muscle weakness  Psychiatric/Behavioral: Positive for depression. Negative for  suicidal ideas.    PHYSICAL EXAM: Blood pressure 121/77, pulse 67, temperature 97.9 F (36.6 C), temperature source Oral, height 6' (1.829 m), weight 258 lb (117 kg), SpO2 97 %. Body mass index is 34.99 kg/m. Physical Exam  Constitutional: She is oriented to person, place, and time. She appears well-developed and well-nourished.  Cardiovascular: Normal rate.   Pulmonary/Chest: Effort normal.  Musculoskeletal: Normal range of motion.  Neurological: She is oriented to person, place, and time.  Skin: Skin is warm and dry.  Vitals reviewed.   RECENT LABS AND TESTS: BMET    Component Value Date/Time   NA 142  03/25/2016 1341   K 4.6 03/25/2016 1341   CL 101 03/25/2016 1341   CO2 26 03/25/2016 1341   GLUCOSE 94 03/25/2016 1341   GLUCOSE 79 03/20/2015 1420   BUN 16 03/25/2016 1341   CREATININE 0.79 03/25/2016 1341   CREATININE 0.82 03/20/2015 1420   CALCIUM 9.3 03/25/2016 1341   GFRNONAA 80 03/25/2016 1341   GFRNONAA 77 03/20/2015 1420   GFRAA 93 03/25/2016 1341   GFRAA 89 03/20/2015 1420   Lab Results  Component Value Date   HGBA1C 5.3 03/25/2016   HGBA1C (H) 09/11/2009    5.7 (NOTE)                                                                       According to the ADA Clinical Practice Recommendations for 2011, when HbA1c is used as a screening test:   >=6.5%   Diagnostic of Diabetes Mellitus           (if abnormal result  is confirmed)  5.7-6.4%   Increased risk of developing Diabetes Mellitus  References:Diagnosis and Classification of Diabetes Mellitus,Diabetes ASNK,5397,67(HALPF 1):S62-S69 and Standards of Medical Care in         Diabetes - 2011,Diabetes XTKW,4097,35  (Suppl 1):S11-S61.   Lab Results  Component Value Date   INSULIN 5.5 03/25/2016   CBC    Component Value Date/Time   WBC 5.2 03/25/2016 1341   WBC 4.1 03/20/2015 1420   RBC 4.22 03/25/2016 1341   RBC 4.21 03/20/2015 1420   HGB 12.6 03/20/2015 1420   HCT 37.2 03/25/2016 1341   PLT 192  03/20/2015 1420   MCV 88 03/25/2016 1341   MCH 29.6 03/25/2016 1341   MCH 29.9 03/20/2015 1420   MCHC 33.6 03/25/2016 1341   MCHC 34.1 03/20/2015 1420   RDW 14.0 03/25/2016 1341   LYMPHSABS 1.2 03/25/2016 1341   MONOABS 0.5 03/20/2015 1420   EOSABS 0.0 03/25/2016 1341   BASOSABS 0.0 03/25/2016 1341   Iron/TIBC/Ferritin/ %Sat No results found for: IRON, TIBC, FERRITIN, IRONPCTSAT Lipid Panel     Component Value Date/Time   CHOL 172 03/25/2016 1341   TRIG 145 03/25/2016 1341   HDL 59 03/25/2016 1341   CHOLHDL 5.0 03/20/2015 1420   VLDL 35 (H) 03/20/2015 1420   LDLCALC 84 03/25/2016 1341   Hepatic Function Panel     Component Value Date/Time   PROT 6.5 03/25/2016 1341   ALBUMIN 4.2 03/25/2016 1341   AST 16 03/25/2016 1341   ALT 14 03/25/2016 1341   ALKPHOS 72 03/25/2016 1341   BILITOT 0.4 03/25/2016 1341      Component Value Date/Time   TSH 1.420 03/25/2016 1341   TSH 1.530 03/20/2015 1420   TSH 2.427 12/04/2014 0908    ASSESSMENT AND PLAN: Vitamin D deficiency - Plan: Vitamin D, Ergocalciferol, (DRISDOL) 50000 units CAPS capsule  Other depression - Plan: buPROPion (WELLBUTRIN SR) 200 MG 12 hr tablet  At risk for heart disease  Class 2 obesity without serious comorbidity with body mass index (BMI) of 35.0 to 35.9 in adult, unspecified obesity type  PLAN:  Vitamin D Deficiency Monique Gonzalez was informed that low vitamin D levels contributes to fatigue and are associated with obesity, breast, and colon cancer. She agrees to  continue to take prescription Vit D @50 ,000 IU every week, we will refill for 1 month and will follow up for routine testing of vitamin D, at least 2-3 times per year. She was informed of the risk of over-replacement of vitamin D and agrees to not increase her dose unless he discusses this with [name] first. Monique Gonzalez agrees to follow up with our clinic in 2 weeks.  Depression with Emotional Eating Behaviors We discussed behavior modification techniques today to  help Monique Gonzalez deal with her emotional eating and depression. She has agreed to take Wellbutrin SR 200 mg qd, we will refill for 1 month and she agreed to follow up as directed.  Cardiovascular risk counselling Monique Gonzalez was given extended (at least 15 minutes) coronary artery disease prevention counseling today. She is 61 y.o. female and has risk factors for heart disease including obesity. We discussed intensive lifestyle modifications today with an emphasis on specific weight loss instructions and strategies. Pt was also informed of the importance of increasing exercise and decreasing saturated fats to help prevent heart disease.  Obesity Monique Gonzalez is currently in the action stage of change. As such, her goal is to continue with weight loss efforts She has agreed to follow a lower carbohydrate, vegetable and lean protein rich diet plan, then back to category 2 after surgery. Monique Gonzalez has been instructed to work up to a goal of 150 minutes of combined cardio and strengthening exercise per week for weight loss and overall health benefits. We discussed the following Behavioral Modification Stratagies today: increasing lean protein intake and decreasing simple carbohydrates   Monique Gonzalez has agreed to follow up with our clinic in 4 weeks. She was informed of the importance of frequent follow up visits to maximize her success with intensive lifestyle modifications for her multiple health conditions.  I, Doreene Nest, am acting as scribe for Dennard Nip, MD  I have reviewed the above documentation for accuracy and completeness, and I agree with the above. -Dennard Nip, MD

## 2016-07-28 ENCOUNTER — Other Ambulatory Visit (INDEPENDENT_AMBULATORY_CARE_PROVIDER_SITE_OTHER): Payer: Self-pay

## 2016-07-28 ENCOUNTER — Other Ambulatory Visit (INDEPENDENT_AMBULATORY_CARE_PROVIDER_SITE_OTHER): Payer: Self-pay | Admitting: Orthopaedic Surgery

## 2016-07-28 DIAGNOSIS — M1711 Unilateral primary osteoarthritis, right knee: Secondary | ICD-10-CM

## 2016-07-29 ENCOUNTER — Encounter (HOSPITAL_COMMUNITY): Payer: Self-pay

## 2016-07-29 ENCOUNTER — Encounter (HOSPITAL_COMMUNITY)
Admission: RE | Admit: 2016-07-29 | Discharge: 2016-07-29 | Disposition: A | Discharge status: Home / Self Care | Payer: 59 | Source: Ambulatory Visit | Attending: Orthopaedic Surgery | Admitting: Orthopaedic Surgery

## 2016-07-29 ENCOUNTER — Other Ambulatory Visit (INDEPENDENT_AMBULATORY_CARE_PROVIDER_SITE_OTHER): Payer: Self-pay | Admitting: Orthopaedic Surgery

## 2016-07-29 DIAGNOSIS — M549 Dorsalgia, unspecified: Secondary | ICD-10-CM | POA: Diagnosis not present

## 2016-07-29 DIAGNOSIS — M8580 Other specified disorders of bone density and structure, unspecified site: Secondary | ICD-10-CM | POA: Insufficient documentation

## 2016-07-29 DIAGNOSIS — E559 Vitamin D deficiency, unspecified: Secondary | ICD-10-CM | POA: Diagnosis not present

## 2016-07-29 DIAGNOSIS — M1990 Unspecified osteoarthritis, unspecified site: Secondary | ICD-10-CM | POA: Insufficient documentation

## 2016-07-29 DIAGNOSIS — Z6834 Body mass index (BMI) 34.0-34.9, adult: Secondary | ICD-10-CM | POA: Insufficient documentation

## 2016-07-29 DIAGNOSIS — M25569 Pain in unspecified knee: Secondary | ICD-10-CM | POA: Insufficient documentation

## 2016-07-29 DIAGNOSIS — G8929 Other chronic pain: Secondary | ICD-10-CM | POA: Insufficient documentation

## 2016-07-29 DIAGNOSIS — Z01812 Encounter for preprocedural laboratory examination: Secondary | ICD-10-CM | POA: Diagnosis not present

## 2016-07-29 DIAGNOSIS — E669 Obesity, unspecified: Secondary | ICD-10-CM | POA: Diagnosis not present

## 2016-07-29 DIAGNOSIS — Z79899 Other long term (current) drug therapy: Secondary | ICD-10-CM | POA: Diagnosis not present

## 2016-07-29 DIAGNOSIS — Z9889 Other specified postprocedural states: Secondary | ICD-10-CM | POA: Insufficient documentation

## 2016-07-29 LAB — SURGICAL PCR SCREEN
MRSA, PCR: NEGATIVE
Staphylococcus aureus: NEGATIVE

## 2016-07-29 LAB — BASIC METABOLIC PANEL
ANION GAP: 8 (ref 5–15)
BUN: 32 mg/dL — ABNORMAL HIGH (ref 6–20)
CALCIUM: 9.4 mg/dL (ref 8.9–10.3)
CO2: 25 mmol/L (ref 22–32)
Chloride: 105 mmol/L (ref 101–111)
Creatinine, Ser: 0.9 mg/dL (ref 0.44–1.00)
GFR calc Af Amer: >60 mL/min (ref >60)
GFR calc non Af Amer: >60 mL/min (ref >60)
Glucose, Bld: 101 mg/dL — ABNORMAL HIGH (ref 65–99)
POTASSIUM: 4.5 mmol/L (ref 3.5–5.1)
Sodium: 138 mmol/L (ref 135–145)

## 2016-07-29 LAB — CBC
HEMATOCRIT: 36.8 % (ref 36.0–46.0)
HEMOGLOBIN: 12.4 g/dL (ref 12.0–15.0)
MCH: 29.7 pg (ref 26.0–34.0)
MCHC: 33.7 g/dL (ref 30.0–36.0)
MCV: 88.2 fL (ref 78.0–100.0)
Platelets: 203 10*3/uL (ref 150–400)
RBC: 4.17 MIL/uL (ref 3.87–5.11)
RDW: 13 % (ref 11.5–15.5)
WBC: 6.2 10*3/uL (ref 4.0–10.5)

## 2016-07-29 NOTE — Pre-Procedure Instructions (Signed)
Monique Gonzalez  07/29/2016      [place] [place] [address] Phone: [phone] Fax: [phone]    Your procedure is scheduled on May 29  Report to [facility] Admitting at 1000 A.M.  Call this number if you have problems the morning of surgery:  [phone]   Remember:  Do not eat food or drink liquids after midnight.  Take these medicines the morning of surgery with A SIP OF WATER Bupropion (Wellbutrin SR), Cyclobenzaprine (Flexeril) if needed  Stop taking aspirin, BC's, Goody's, Herbal medications, Fish Oil, Vitamins, Ibuprofen, Advil, Motrin, Aleve, Meloxicam (Mobic)   Do not wear jewelry, make-up or nail polish.  Do not wear lotions, powders, or perfumes, or deoderant.  Do not shave 48 hours prior to surgery.  Men may shave face and neck.  Do not bring valuables to the hospital.  [facility] is not responsible for any belongings or valuables.  Contacts, dentures or bridgework may not be worn into surgery.  Leave your suitcase in the car.  After surgery it may be brought to your room.  For patients admitted to the hospital, discharge time will be determined by your treatment team.  Patients discharged the day of surgery will not be allowed to drive home.   Special instructions:  [facility] - Preparing for Surgery  Before surgery, you can play an important role.  Because skin is not sterile, your skin needs to be as free of germs as possible.  You can reduce the number of germs on you skin by washing with CHG (chlorahexidine gluconate) soap before surgery.  CHG is an antiseptic cleaner which kills germs and bonds with the skin to continue killing germs even after washing.  Please DO NOT use if you have an allergy to CHG or antibacterial soaps.  If your skin becomes reddened/irritated stop using the CHG and inform your nurse when you arrive at Short  Stay.  Do not shave (including legs and underarms) for at least 48 hours prior to the first CHG shower.  You may shave your face.  Please follow these instructions carefully:   1.  Shower with CHG Soap the night before surgery and the                                morning of Surgery.  2.  If you choose to wash your hair, wash your hair first as usual with your       normal shampoo.  3.  After you shampoo, rinse your hair and body thoroughly to remove the                      Shampoo.  4.  Use CHG as you would any other liquid soap.  You can apply chg directly       to the skin and wash gently with scrungie or a clean washcloth.  5.  Apply the CHG Soap to your body ONLY FROM THE NECK DOWN.        Do not use on open wounds or open sores.  Avoid contact with your eyes,       ears, mouth and genitals (private parts).  Wash genitals (private parts)       with your normal soap.  6.  Wash thoroughly, paying  special attention to the area where your surgery        will be performed.  7.  Thoroughly rinse your body with warm water from the neck down.  8.  DO NOT shower/wash with your normal soap after using and rinsing off       the CHG Soap.  9.  Pat yourself dry with a clean towel.            10.  Wear clean pajamas.            11.  Place clean sheets on your bed the night of your first shower and do not        sleep with pets.  Day of Surgery  Do not apply any lotions/deoderants the morning of surgery.  Please wear clean clothes to the hospital/surgery center.     Please read over the following fact sheets that you were given. Pain Booklet, Coughing and Deep Breathing, MRSA Information and Surgical Site Infection Prevention

## 2016-07-29 NOTE — Progress Notes (Signed)
PCP is Dena Billet, PA-c Denies seeing a cardiologist. Denies ever having a card cath or echo. States she had a stress test years ago and states it was normal. Denies any chest pain, fever, cough, or shortness of breath.

## 2016-07-31 ENCOUNTER — Encounter (INDEPENDENT_AMBULATORY_CARE_PROVIDER_SITE_OTHER): Payer: Self-pay | Admitting: Family Medicine

## 2016-08-01 ENCOUNTER — Other Ambulatory Visit: Payer: Self-pay

## 2016-08-01 NOTE — Patient Outreach (Signed)
[facility]) Care Management  08/01/2016  Monique Gonzalez May 31, 1955 [id number]   Subjective: Telephone call to patient. Discussed [facility] pre-op call. Patient voices understanding and agrees to pre-op call.    Objective: per chart review-patient with unilateral primary osteoarthritis, right knee is scheduled for right total knee arthroplasty on 08/05/16.  Assessment: Received [facility] pre-op referral on 5/23. Pre-Op call completed.  FMLA-Ms. Tibbitts reports her FMLA forms are being completed by her provider's office now. She has been in contact with [employer]. RNCM reinforced that it is good to have a copy for her records. Ms. Sano reports she is going to access her short term disability resource from another provider. Ms. Moffitt states she has the [place] plan benefit and will make sure she follows up with receiving this benefit.   Post procedure equipment/home health- RNCM reinforced that the inpatient case manager in the hospital will arrange if she has any of these needs prior to discharge.   RNCM discussed [place] benefit is higher when using a [facility] facility/pharmacy. RNCM reinforced that if she is discharged home on the weekend with a new prescription that she will have to use a local pharmacy due to [facility] pharmacies are not open on the weekend.     Support:  Ms. Lightle states her daughter is flying in and will be with her all month. She reports that between her daughter and her sisters, she will have transportation and support for all her needs. She adds she usually uses the [facility] location.  Discussed Advanced Directives. RNCM reinforced that Spiritual care department available to assist [employer] employees with Advanced Directives as needed.  No other medical issues identified and no additional community resource information needs at this time.   Patient is agreeable to follow up post procedure call.  Plan: telephonic RNCM will follow  up post discharge within 3 business days of notification.  Thea Silversmith, RN, MSN, [place] Coordinator Cell: [phone]

## 2016-08-03 MED ORDER — TRANEXAMIC ACID 1000 MG/10ML IV SOLN
1000.0000 mg | INTRAVENOUS | Status: AC
  Administered 2016-08-05: 1000 mg via INTRAVENOUS
  Filled 2016-08-03: qty 10

## 2016-08-03 MED ORDER — CLINDAMYCIN PHOSPHATE 900 MG/50ML IV SOLN
900.0000 mg | INTRAVENOUS | Status: AC
  Administered 2016-08-05: 900 mg via INTRAVENOUS
  Filled 2016-08-03: qty 50

## 2016-08-05 ENCOUNTER — Inpatient Hospital Stay (HOSPITAL_COMMUNITY): Payer: 59

## 2016-08-05 ENCOUNTER — Inpatient Hospital Stay (HOSPITAL_COMMUNITY)
Admission: RE | Admit: 2016-08-05 | Discharge: 2016-08-11 | DRG: 470 | Disposition: A | Discharge status: Skilled Nursing Facility | Payer: 59 | Source: Ambulatory Visit | Attending: Orthopaedic Surgery | Admitting: Orthopaedic Surgery

## 2016-08-05 ENCOUNTER — Encounter (HOSPITAL_COMMUNITY): Payer: Self-pay | Admitting: *Deleted

## 2016-08-05 ENCOUNTER — Inpatient Hospital Stay (HOSPITAL_COMMUNITY): Payer: 59 | Admitting: Certified Registered Nurse Anesthetist

## 2016-08-05 ENCOUNTER — Encounter (HOSPITAL_COMMUNITY)
Admission: RE | Disposition: A | Discharge status: Skilled Nursing Facility | Payer: Self-pay | Source: Ambulatory Visit | Attending: Orthopaedic Surgery

## 2016-08-05 DIAGNOSIS — Z6835 Body mass index (BMI) 35.0-35.9, adult: Secondary | ICD-10-CM

## 2016-08-05 DIAGNOSIS — R55 Syncope and collapse: Secondary | ICD-10-CM | POA: Diagnosis not present

## 2016-08-05 DIAGNOSIS — Z791 Long term (current) use of non-steroidal anti-inflammatories (NSAID): Secondary | ICD-10-CM | POA: Diagnosis not present

## 2016-08-05 DIAGNOSIS — M545 Low back pain, unspecified: Secondary | ICD-10-CM

## 2016-08-05 DIAGNOSIS — R278 Other lack of coordination: Secondary | ICD-10-CM | POA: Diagnosis not present

## 2016-08-05 DIAGNOSIS — Z882 Allergy status to sulfonamides status: Secondary | ICD-10-CM

## 2016-08-05 DIAGNOSIS — G8918 Other acute postprocedural pain: Secondary | ICD-10-CM | POA: Diagnosis not present

## 2016-08-05 DIAGNOSIS — E785 Hyperlipidemia, unspecified: Secondary | ICD-10-CM | POA: Diagnosis present

## 2016-08-05 DIAGNOSIS — Z87891 Personal history of nicotine dependence: Secondary | ICD-10-CM

## 2016-08-05 DIAGNOSIS — E669 Obesity, unspecified: Secondary | ICD-10-CM | POA: Diagnosis present

## 2016-08-05 DIAGNOSIS — Z8261 Family history of arthritis: Secondary | ICD-10-CM

## 2016-08-05 DIAGNOSIS — S8990XA Unspecified injury of unspecified lower leg, initial encounter: Secondary | ICD-10-CM | POA: Diagnosis not present

## 2016-08-05 DIAGNOSIS — R2689 Other abnormalities of gait and mobility: Secondary | ICD-10-CM | POA: Diagnosis not present

## 2016-08-05 DIAGNOSIS — Z96651 Presence of right artificial knee joint: Secondary | ICD-10-CM

## 2016-08-05 DIAGNOSIS — Z800 Family history of malignant neoplasm of digestive organs: Secondary | ICD-10-CM | POA: Diagnosis not present

## 2016-08-05 DIAGNOSIS — D62 Acute posthemorrhagic anemia: Secondary | ICD-10-CM | POA: Diagnosis not present

## 2016-08-05 DIAGNOSIS — E559 Vitamin D deficiency, unspecified: Secondary | ICD-10-CM | POA: Diagnosis not present

## 2016-08-05 DIAGNOSIS — F339 Major depressive disorder, recurrent, unspecified: Secondary | ICD-10-CM | POA: Diagnosis not present

## 2016-08-05 DIAGNOSIS — Z880 Allergy status to penicillin: Secondary | ICD-10-CM

## 2016-08-05 DIAGNOSIS — M1711 Unilateral primary osteoarthritis, right knee: Principal | ICD-10-CM | POA: Diagnosis present

## 2016-08-05 DIAGNOSIS — Z471 Aftercare following joint replacement surgery: Secondary | ICD-10-CM | POA: Diagnosis not present

## 2016-08-05 DIAGNOSIS — G8929 Other chronic pain: Secondary | ICD-10-CM | POA: Diagnosis not present

## 2016-08-05 DIAGNOSIS — G8911 Acute pain due to trauma: Secondary | ICD-10-CM | POA: Diagnosis not present

## 2016-08-05 HISTORY — PX: TOTAL KNEE ARTHROPLASTY: SHX125

## 2016-08-05 SURGERY — ARTHROPLASTY, KNEE, TOTAL
Anesthesia: Spinal | Site: Knee | Laterality: Right

## 2016-08-05 MED ORDER — ALUM & MAG HYDROXIDE-SIMETH 200-200-20 MG/5ML PO SUSP: 30.0000 mL | ORAL | Status: DC | PRN

## 2016-08-05 MED ORDER — MENTHOL 3 MG MT LOZG: 1.0000 | LOZENGE | OROMUCOSAL | Status: DC | PRN

## 2016-08-05 MED ORDER — POLYETHYLENE GLYCOL 3350 17 G PO PACK
17.0000 g | PACK | Freq: Every day | ORAL | Status: DC | PRN
  Filled 2016-08-05: qty 1

## 2016-08-05 MED ORDER — METHOCARBAMOL 500 MG PO TABS
500.0000 mg | ORAL_TABLET | Freq: Four times a day (QID) | ORAL | Status: DC | PRN
  Administered 2016-08-05 – 2016-08-11 (×16): 500 mg via ORAL
  Filled 2016-08-05 (×16): qty 1

## 2016-08-05 MED ORDER — OXYCODONE HCL 5 MG PO TABS
5.0000 mg | ORAL_TABLET | ORAL | Status: DC | PRN
  Administered 2016-08-05 (×2): 10 mg via ORAL
  Administered 2016-08-06: 5 mg via ORAL
  Administered 2016-08-06 (×2): 10 mg via ORAL
  Administered 2016-08-06: 5 mg via ORAL
  Administered 2016-08-07 – 2016-08-10 (×16): 10 mg via ORAL
  Administered 2016-08-10: 5 mg via ORAL
  Administered 2016-08-10 – 2016-08-11 (×7): 10 mg via ORAL
  Filled 2016-08-05 (×16): qty 2
  Filled 2016-08-05: qty 1
  Filled 2016-08-05 (×10): qty 2
  Filled 2016-08-05: qty 1
  Filled 2016-08-05: qty 2

## 2016-08-05 MED ORDER — HYDROMORPHONE HCL 1 MG/ML IJ SOLN
1.0000 mg | INTRAMUSCULAR | Status: DC | PRN
  Administered 2016-08-05 – 2016-08-06 (×2): 1 mg via INTRAVENOUS
  Filled 2016-08-05 (×2): qty 1

## 2016-08-05 MED ORDER — METHOCARBAMOL 500 MG PO TABS
ORAL_TABLET | ORAL | Status: AC
  Filled 2016-08-05: qty 1

## 2016-08-05 MED ORDER — 0.9 % SODIUM CHLORIDE (POUR BTL) OPTIME
TOPICAL | Status: DC | PRN
  Administered 2016-08-05: 1000 mL

## 2016-08-05 MED ORDER — MEPERIDINE HCL 25 MG/ML IJ SOLN: 6.2500 mg | INTRAMUSCULAR | Status: DC | PRN

## 2016-08-05 MED ORDER — PHENOL 1.4 % MT LIQD: 1.0000 | OROMUCOSAL | Status: DC | PRN

## 2016-08-05 MED ORDER — ONDANSETRON HCL 4 MG PO TABS: 4.0000 mg | ORAL_TABLET | Freq: Four times a day (QID) | ORAL | Status: DC | PRN

## 2016-08-05 MED ORDER — METOCLOPRAMIDE HCL 5 MG/ML IJ SOLN: 10.0000 mg | Freq: Once | INTRAMUSCULAR | Status: DC | PRN

## 2016-08-05 MED ORDER — DEXAMETHASONE SODIUM PHOSPHATE 10 MG/ML IJ SOLN
INTRAMUSCULAR | Status: DC | PRN
  Administered 2016-08-05: 10 mg via INTRAVENOUS

## 2016-08-05 MED ORDER — ACETAMINOPHEN 325 MG PO TABS
650.0000 mg | ORAL_TABLET | Freq: Four times a day (QID) | ORAL | Status: DC | PRN
Start: 2016-08-05 — End: 2016-08-11
  Administered 2016-08-06 – 2016-08-11 (×4): 650 mg via ORAL
  Filled 2016-08-05 (×4): qty 2

## 2016-08-05 MED ORDER — ACETAMINOPHEN 650 MG RE SUPP: 650.0000 mg | Freq: Four times a day (QID) | RECTAL | Status: DC | PRN

## 2016-08-05 MED ORDER — OXYCODONE HCL 5 MG PO TABS
ORAL_TABLET | ORAL | Status: AC
  Filled 2016-08-05: qty 2

## 2016-08-05 MED ORDER — FENTANYL CITRATE (PF) 100 MCG/2ML IJ SOLN
INTRAMUSCULAR | Status: DC | PRN
Start: 2016-08-05 — End: 2016-08-05
  Administered 2016-08-05: 50 ug via INTRAVENOUS

## 2016-08-05 MED ORDER — MIDAZOLAM HCL 2 MG/2ML IJ SOLN
2.0000 mg | Freq: Once | INTRAMUSCULAR | Status: AC
  Administered 2016-08-05: 2 mg via INTRAVENOUS

## 2016-08-05 MED ORDER — CLINDAMYCIN PHOSPHATE 600 MG/50ML IV SOLN
600.0000 mg | Freq: Four times a day (QID) | INTRAVENOUS | Status: AC
  Administered 2016-08-05 – 2016-08-06 (×2): 600 mg via INTRAVENOUS
  Filled 2016-08-05 (×2): qty 50

## 2016-08-05 MED ORDER — LACTATED RINGERS IV SOLN
INTRAVENOUS | Status: DC
  Administered 2016-08-05 (×2): via INTRAVENOUS

## 2016-08-05 MED ORDER — ONDANSETRON HCL 4 MG/2ML IJ SOLN
4.0000 mg | Freq: Four times a day (QID) | INTRAMUSCULAR | Status: DC | PRN
  Administered 2016-08-06: 4 mg via INTRAVENOUS
  Filled 2016-08-05: qty 2

## 2016-08-05 MED ORDER — SODIUM CHLORIDE 0.9 % IV SOLN
INTRAVENOUS | Status: DC
  Administered 2016-08-05 (×2): via INTRAVENOUS

## 2016-08-05 MED ORDER — BUPIVACAINE-EPINEPHRINE (PF) 0.5% -1:200000 IJ SOLN
INTRAMUSCULAR | Status: DC | PRN
  Administered 2016-08-05: 30 mL via PERINEURAL

## 2016-08-05 MED ORDER — KETOROLAC TROMETHAMINE 15 MG/ML IJ SOLN
7.5000 mg | Freq: Four times a day (QID) | INTRAMUSCULAR | Status: AC
  Administered 2016-08-05 – 2016-08-06 (×2): 7.5 mg via INTRAVENOUS
  Filled 2016-08-05 (×2): qty 1

## 2016-08-05 MED ORDER — DOCUSATE SODIUM 100 MG PO CAPS
100.0000 mg | ORAL_CAPSULE | Freq: Two times a day (BID) | ORAL | Status: DC
  Administered 2016-08-05 – 2016-08-11 (×12): 100 mg via ORAL
  Filled 2016-08-05 (×12): qty 1

## 2016-08-05 MED ORDER — PROPOFOL 10 MG/ML IV BOLUS
INTRAVENOUS | Status: DC | PRN
Start: 2016-08-05 — End: 2016-08-05
  Administered 2016-08-05: 20 mg via INTRAVENOUS

## 2016-08-05 MED ORDER — METHOCARBAMOL 1000 MG/10ML IJ SOLN
500.0000 mg | Freq: Four times a day (QID) | INTRAMUSCULAR | Status: DC | PRN
  Filled 2016-08-05: qty 5

## 2016-08-05 MED ORDER — PHENYLEPHRINE HCL 10 MG/ML IJ SOLN
INTRAMUSCULAR | Status: DC | PRN
  Administered 2016-08-05 (×3): 80 ug via INTRAVENOUS

## 2016-08-05 MED ORDER — CITALOPRAM HYDROBROMIDE 20 MG PO TABS
20.0000 mg | ORAL_TABLET | Freq: Every day | ORAL | Status: DC
  Administered 2016-08-05 – 2016-08-10 (×6): 20 mg via ORAL
  Filled 2016-08-05 (×6): qty 1

## 2016-08-05 MED ORDER — DIPHENHYDRAMINE HCL 12.5 MG/5ML PO ELIX: 12.5000 mg | ORAL_SOLUTION | ORAL | Status: DC | PRN

## 2016-08-05 MED ORDER — FLUTICASONE PROPIONATE 50 MCG/ACT NA SUSP
2.0000 | Freq: Every day | NASAL | Status: DC
  Administered 2016-08-05 – 2016-08-10 (×6): 2 via NASAL
  Filled 2016-08-05: qty 16

## 2016-08-05 MED ORDER — MIDAZOLAM HCL 2 MG/2ML IJ SOLN
INTRAMUSCULAR | Status: AC
  Filled 2016-08-05: qty 2

## 2016-08-05 MED ORDER — METOCLOPRAMIDE HCL 5 MG PO TABS: 5.0000 mg | ORAL_TABLET | Freq: Three times a day (TID) | ORAL | Status: DC | PRN

## 2016-08-05 MED ORDER — FENTANYL CITRATE (PF) 100 MCG/2ML IJ SOLN
INTRAMUSCULAR | Status: AC
  Filled 2016-08-05: qty 2

## 2016-08-05 MED ORDER — ATORVASTATIN CALCIUM 40 MG PO TABS
40.0000 mg | ORAL_TABLET | Freq: Every day | ORAL | Status: DC
  Administered 2016-08-05 – 2016-08-10 (×6): 40 mg via ORAL
  Filled 2016-08-05 (×6): qty 1

## 2016-08-05 MED ORDER — LIDOCAINE 2% (20 MG/ML) 5 ML SYRINGE
INTRAMUSCULAR | Status: AC
  Filled 2016-08-05: qty 15

## 2016-08-05 MED ORDER — MIDAZOLAM HCL 2 MG/2ML IJ SOLN
INTRAMUSCULAR | Status: AC
  Administered 2016-08-05: 2 mg via INTRAVENOUS
  Filled 2016-08-05: qty 2

## 2016-08-05 MED ORDER — MIDAZOLAM HCL 5 MG/5ML IJ SOLN
INTRAMUSCULAR | Status: DC | PRN
  Administered 2016-08-05: 1 mg via INTRAVENOUS

## 2016-08-05 MED ORDER — PROPOFOL 10 MG/ML IV BOLUS
INTRAVENOUS | Status: AC
  Filled 2016-08-05: qty 20

## 2016-08-05 MED ORDER — METOCLOPRAMIDE HCL 5 MG/ML IJ SOLN: 5.0000 mg | Freq: Three times a day (TID) | INTRAMUSCULAR | Status: DC | PRN

## 2016-08-05 MED ORDER — PHENYLEPHRINE 40 MCG/ML (10ML) SYRINGE FOR IV PUSH (FOR BLOOD PRESSURE SUPPORT)
PREFILLED_SYRINGE | INTRAVENOUS | Status: AC
  Filled 2016-08-05: qty 20

## 2016-08-05 MED ORDER — FENTANYL CITRATE (PF) 100 MCG/2ML IJ SOLN
INTRAMUSCULAR | Status: AC
  Administered 2016-08-05: 100 ug via INTRAVENOUS
  Filled 2016-08-05: qty 2

## 2016-08-05 MED ORDER — LIDOCAINE HCL (CARDIAC) 20 MG/ML IV SOLN
INTRAVENOUS | Status: DC | PRN
  Administered 2016-08-05: 40 mg via INTRAVENOUS

## 2016-08-05 MED ORDER — FENTANYL CITRATE (PF) 100 MCG/2ML IJ SOLN
25.0000 ug | INTRAMUSCULAR | Status: DC | PRN
  Administered 2016-08-05 (×2): 50 ug via INTRAVENOUS

## 2016-08-05 MED ORDER — DEXTROSE 5 % IV SOLN
INTRAVENOUS | Status: DC | PRN
  Administered 2016-08-05: 25 ug/min via INTRAVENOUS

## 2016-08-05 MED ORDER — PANTOPRAZOLE SODIUM 40 MG IV SOLR
40.0000 mg | Freq: Every day | INTRAVENOUS | Status: DC
  Administered 2016-08-05: 40 mg via INTRAVENOUS
  Filled 2016-08-05: qty 40

## 2016-08-05 MED ORDER — FENTANYL CITRATE (PF) 100 MCG/2ML IJ SOLN
100.0000 ug | Freq: Once | INTRAMUSCULAR | Status: AC
  Administered 2016-08-05: 100 ug via INTRAVENOUS

## 2016-08-05 MED ORDER — DEXAMETHASONE SODIUM PHOSPHATE 10 MG/ML IJ SOLN
INTRAMUSCULAR | Status: AC
  Filled 2016-08-05: qty 2

## 2016-08-05 MED ORDER — ONDANSETRON HCL 4 MG/2ML IJ SOLN
INTRAMUSCULAR | Status: DC | PRN
  Administered 2016-08-05: 4 mg via INTRAVENOUS

## 2016-08-05 MED ORDER — BUPIVACAINE IN DEXTROSE 0.75-8.25 % IT SOLN
INTRATHECAL | Status: DC | PRN
  Administered 2016-08-05: 1.5 mL via INTRATHECAL

## 2016-08-05 MED ORDER — BUPROPION HCL ER (SR) 100 MG PO TB12
200.0000 mg | ORAL_TABLET | Freq: Every day | ORAL | Status: DC
  Administered 2016-08-06 – 2016-08-11 (×6): 200 mg via ORAL
  Filled 2016-08-05 (×6): qty 2

## 2016-08-05 MED ORDER — ASPIRIN EC 325 MG PO TBEC
325.0000 mg | DELAYED_RELEASE_TABLET | Freq: Two times a day (BID) | ORAL | Status: DC
  Administered 2016-08-06 – 2016-08-11 (×11): 325 mg via ORAL
  Filled 2016-08-05 (×11): qty 1

## 2016-08-05 MED ORDER — PROPOFOL 500 MG/50ML IV EMUL
INTRAVENOUS | Status: DC | PRN
Start: 2016-08-05 — End: 2016-08-05
  Administered 2016-08-05: 75 ug/kg/min via INTRAVENOUS

## 2016-08-05 MED ORDER — LACTATED RINGERS IV SOLN: INTRAVENOUS | Status: DC

## 2016-08-05 MED ORDER — SODIUM CHLORIDE 0.9 % IR SOLN
Status: DC | PRN
  Administered 2016-08-05: 1000 mL

## 2016-08-05 MED ORDER — BUPROPION HCL ER (SR) 100 MG PO TB12: 200.0000 mg | ORAL_TABLET | Freq: Every day | ORAL | Status: DC

## 2016-08-05 MED ORDER — ONDANSETRON HCL 4 MG/2ML IJ SOLN
INTRAMUSCULAR | Status: AC
  Filled 2016-08-05: qty 2

## 2016-08-05 MED ORDER — FENTANYL CITRATE (PF) 250 MCG/5ML IJ SOLN
INTRAMUSCULAR | Status: AC
  Filled 2016-08-05: qty 5

## 2016-08-05 SURGICAL SUPPLY — 58 items
BANDAGE ACE 6X5 VEL STRL LF (GAUZE/BANDAGES/DRESSINGS) ×2 IMPLANT
BANDAGE ESMARK 6X9 LF (GAUZE/BANDAGES/DRESSINGS) ×1 IMPLANT
BLADE SAG 18X100X1.27 (BLADE) ×2 IMPLANT
BNDG ESMARK 6X9 LF (GAUZE/BANDAGES/DRESSINGS) ×2
BOWL SMART MIX CTS (DISPOSABLE) ×2 IMPLANT
CAPT KNEE TOTAL 3 ×2 IMPLANT
CEMENT BONE SIMPLEX SPEEDSET (Cement) ×4 IMPLANT
COVER SURGICAL LIGHT HANDLE (MISCELLANEOUS) ×2 IMPLANT
CUFF TOURNIQUET SINGLE 34IN LL (TOURNIQUET CUFF) ×2 IMPLANT
CUFF TOURNIQUET SINGLE 44IN (TOURNIQUET CUFF) IMPLANT
DRAPE EXTREMITY T 121X128X90 (DRAPE) ×2 IMPLANT
DRAPE HALF SHEET 40X57 (DRAPES) ×2 IMPLANT
DRAPE U-SHAPE 47X51 STRL (DRAPES) ×2 IMPLANT
DRSG PAD ABDOMINAL 8X10 ST (GAUZE/BANDAGES/DRESSINGS) ×2 IMPLANT
DURAPREP 26ML APPLICATOR (WOUND CARE) ×2 IMPLANT
ELECT CAUTERY BLADE 6.4 (BLADE) ×2 IMPLANT
ELECT REM PT RETURN 9FT ADLT (ELECTROSURGICAL) ×2
ELECTRODE REM PT RTRN 9FT ADLT (ELECTROSURGICAL) ×1 IMPLANT
FACESHIELD WRAPAROUND (MASK) ×4 IMPLANT
GAUZE SPONGE 4X4 12PLY STRL (GAUZE/BANDAGES/DRESSINGS) ×2 IMPLANT
GAUZE XEROFORM 1X8 LF (GAUZE/BANDAGES/DRESSINGS) ×2 IMPLANT
GLOVE BIOGEL PI IND STRL 8 (GLOVE) ×2 IMPLANT
GLOVE BIOGEL PI INDICATOR 8 (GLOVE) ×2
GLOVE ORTHO TXT STRL SZ7.5 (GLOVE) ×2 IMPLANT
GLOVE SURG ORTHO 8.0 STRL STRW (GLOVE) ×2 IMPLANT
GOWN STRL REUS W/ TWL LRG LVL3 (GOWN DISPOSABLE) IMPLANT
GOWN STRL REUS W/ TWL XL LVL3 (GOWN DISPOSABLE) ×2 IMPLANT
GOWN STRL REUS W/TWL LRG LVL3 (GOWN DISPOSABLE)
GOWN STRL REUS W/TWL XL LVL3 (GOWN DISPOSABLE) ×2
HANDPIECE INTERPULSE COAX TIP (DISPOSABLE) ×1
IMMOBILIZER KNEE 22 UNIV (SOFTGOODS) ×2 IMPLANT
KIT BASIN OR (CUSTOM PROCEDURE TRAY) ×2 IMPLANT
KIT ROOM TURNOVER OR (KITS) ×2 IMPLANT
MANIFOLD NEPTUNE II (INSTRUMENTS) ×2 IMPLANT
NDL SAFETY ECLIPSE 18X1.5 (NEEDLE) IMPLANT
NEEDLE HYPO 18GX1.5 SHARP (NEEDLE)
NS IRRIG 1000ML POUR BTL (IV SOLUTION) ×2 IMPLANT
PACK TOTAL JOINT (CUSTOM PROCEDURE TRAY) ×2 IMPLANT
PAD ARMBOARD 7.5X6 YLW CONV (MISCELLANEOUS) ×2 IMPLANT
PADDING CAST COTTON 6X4 STRL (CAST SUPPLIES) ×2 IMPLANT
SET HNDPC FAN SPRY TIP SCT (DISPOSABLE) ×1 IMPLANT
SET PAD KNEE POSITIONER (MISCELLANEOUS) ×2 IMPLANT
STAPLER VISISTAT 35W (STAPLE) IMPLANT
STRIP CLOSURE SKIN 1/2X4 (GAUZE/BANDAGES/DRESSINGS) ×2 IMPLANT
SUCTION FRAZIER HANDLE 10FR (MISCELLANEOUS) ×1
SUCTION TUBE FRAZIER 10FR DISP (MISCELLANEOUS) ×1 IMPLANT
SUT MNCRL AB 4-0 PS2 18 (SUTURE) IMPLANT
SUT VIC AB 0 CT1 27 (SUTURE) ×1
SUT VIC AB 0 CT1 27XBRD ANBCTR (SUTURE) ×1 IMPLANT
SUT VIC AB 1 CT1 27 (SUTURE) ×2
SUT VIC AB 1 CT1 27XBRD ANBCTR (SUTURE) ×2 IMPLANT
SUT VIC AB 2-0 CT1 27 (SUTURE) ×2
SUT VIC AB 2-0 CT1 TAPERPNT 27 (SUTURE) ×2 IMPLANT
SYR 50ML LL SCALE MARK (SYRINGE) IMPLANT
TOWEL OR 17X24 6PK STRL BLUE (TOWEL DISPOSABLE) ×2 IMPLANT
TOWEL OR 17X26 10 PK STRL BLUE (TOWEL DISPOSABLE) ×2 IMPLANT
TRAY CATH 16FR W/PLASTIC CATH (SET/KITS/TRAYS/PACK) IMPLANT
WRAP KNEE MAXI GEL POST OP (GAUZE/BANDAGES/DRESSINGS) ×2 IMPLANT

## 2016-08-05 NOTE — Anesthesia Postprocedure Evaluation (Signed)
Anesthesia Post Note  Patient: Monique Gonzalez  Procedure(s) Performed: Procedure(s) (LRB): RIGHT TOTAL KNEE ARTHROPLASTY (Right)  Patient location during evaluation: PACU Anesthesia Type: Spinal Level of consciousness: oriented and awake and alert Pain management: pain level controlled Vital Signs Assessment: post-procedure vital signs reviewed and stable Respiratory status: spontaneous breathing, respiratory function stable and patient connected to nasal cannula oxygen Cardiovascular status: blood pressure returned to baseline and stable Postop Assessment: no headache, no backache and spinal receding Anesthetic complications: no       Last Vitals:  Vitals:   08/05/16 1936 08/05/16 1938  BP:  (!) 137/59  Pulse: 85 82  Resp: 18 10  Temp:  36.7 C    Last Pain:  Vitals:   08/05/16 1938  TempSrc:   PainSc: 2     LLE Motor Response: Purposeful movement (08/05/16 1938) LLE Sensation: Full sensation (08/05/16 1938) RLE Motor Response: Purposeful movement (08/05/16 1938) RLE Sensation: Full sensation (08/05/16 1938) L Sensory Level: S1-Sole of foot, small toes (08/05/16 1938) R Sensory Level: S1-Sole of foot, small toes (08/05/16 1938)  Effie Berkshire

## 2016-08-05 NOTE — Brief Op Note (Signed)
08/05/2016  2:12 PM  PATIENT:  Monique Gonzalez  61 y.o. female  PRE-OPERATIVE DIAGNOSIS:  severe osteoarthritis right knee  POST-OPERATIVE DIAGNOSIS:  Severe Osteoarthritis right knee  PROCEDURE:  Procedure(s): RIGHT TOTAL KNEE ARTHROPLASTY (Right)  SURGEON:  Surgeon(s) and Role:    Mcarthur Rossetti, MD - Primary  PHYSICIAN ASSISTANT: Benita Stabile, PA-C  ANESTHESIA:   regional and spinal  EBL:  Total I/O In: 1000 [I.V.:1000] Out: 50 [Blood:50]  COUNTS:  YES  TOURNIQUET:   Total Tourniquet Time Documented: Thigh (Right) - 61 minutes Total: Thigh (Right) - 61 minutes   DICTATION: .[id number]  PLAN OF CARE: Admit to inpatient   PATIENT DISPOSITION:  PACU - hemodynamically stable.   Delay start of Pharmacological VTE agent (>24hrs) due to surgical blood loss or risk of bleeding: no

## 2016-08-05 NOTE — Anesthesia Procedure Notes (Signed)
Anesthesia Regional Block: Adductor canal block   Pre-Anesthetic Checklist: ,, timeout performed, Correct Patient, Correct Site, Correct Laterality, Correct Procedure, Correct Position, site marked, Risks and benefits discussed,  Surgical consent,  Pre-op evaluation,  At surgeon's request and post-op pain management  Laterality: Right and Lower  Prep: Maximum Sterile Barrier Precautions used, chloraprep       Needles:  Injection technique: Single-shot  Needle Type: Echogenic Stimulator Needle     Needle Length: 10cm      Additional Needles:   Procedures: ultrasound guided,,,,,,,,  Narrative:  Start time: 08/05/2016 11:10 AM End time: 08/05/2016 11:15 AM Injection made incrementally with aspirations every 5 mL.  Performed by: Personally  Anesthesiologist: Montez Hageman  Additional Notes: Risks, benefits and alternative to block explained extensively.  Patient tolerated procedure well, without complications.

## 2016-08-05 NOTE — Transfer of Care (Signed)
Immediate Anesthesia Transfer of Care Note  Patient: Monique Gonzalez  Procedure(s) Performed: Procedure(s): RIGHT TOTAL KNEE ARTHROPLASTY (Right)  Patient Location: PACU  Anesthesia Type:Spinal and MAC combined with regional for post-op pain  Level of Consciousness: awake, alert  and oriented  Airway & Oxygen Therapy: Patient Spontanous Breathing  Post-op Assessment: Report given to RN and Post -op Vital signs reviewed and stable  Post vital signs: Reviewed and stable  Last Vitals:  Vitals:   08/05/16 1115 08/05/16 1116  BP:  (!) 139/51  Pulse: 73 69  Resp: 10 10  Temp:      Last Pain:  Vitals:   08/05/16 1017  TempSrc: Oral         Complications: No apparent anesthesia complications

## 2016-08-05 NOTE — Anesthesia Procedure Notes (Signed)
Procedure Name: MAC Date/Time: 08/05/2016 12:13 PM Performed by: Candis Shine Pre-anesthesia Checklist: Patient identified, Emergency Drugs available, Suction available, Patient being monitored and Timeout performed Patient Re-evaluated:Patient Re-evaluated prior to inductionOxygen Delivery Method: Simple face mask Dental Injury: Teeth and Oropharynx as per pre-operative assessment

## 2016-08-05 NOTE — H&P (Signed)
TOTAL KNEE ADMISSION H&P  Patient is being admitted for right total knee arthroplasty.  Subjective:  Chief Complaint:right knee pain.  HPI: Monique Gonzalez, 61 y.o. female, has a history of pain and functional disability in the right knee due to arthritis and has failed non-surgical conservative treatments for greater than 12 weeks to includeNSAID's and/or analgesics, corticosteriod injections, viscosupplementation injections, flexibility and strengthening excercises, use of assistive devices, weight reduction as appropriate and activity modification.  Onset of symptoms was gradual, starting 5 years ago with gradually worsening course since that time. The patient noted no past surgery on the right knee(s).  Patient currently rates pain in the right knee(s) at 10 out of 10 with activity. Patient has night pain, worsening of pain with activity and weight bearing, pain that interferes with activities of daily living, pain with passive range of motion, crepitus and joint swelling.  Patient has evidence of subchondral sclerosis, periarticular osteophytes and joint space narrowing by imaging studies. There is no active infection.  Patient Active Problem List   Diagnosis Date Noted  . Class 2 obesity without serious comorbidity with body mass index (BMI) of 35.0 to 35.9 in adult 07/23/2016  . Unilateral primary osteoarthritis, right knee 05/20/2016  . Obesity (BMI 35.0-39.9 without comorbidity) 05/06/2016  . Class 2 obesity without serious comorbidity with body mass index (BMI) of 36.0 to 36.9 in adult 04/22/2016  . Class 2 obesity without serious comorbidity with body mass index (BMI) of 37.0 to 37.9 in adult 04/08/2016  . Vitamin D deficiency 04/08/2016  . Shortness of breath 03/25/2016  . Fracture, radius 08/08/2014  . Constipation 09/20/2013  . Syncope and collapse 11/30/2012  . Recurrent cold sores   . Right knee pain 11/23/2011  . Osteoarthritis   . Depression   . Hyperlipidemia    Past  Medical History:  Diagnosis Date  . Allergy   . Chronic pain    back and knee  . Colitis 09/2012   infectious vs inflammatory.   . Colon stricture ([place])   . Depression   . Hyperlipidemia   . Obesity   . Osteoarthritis   . Osteopenia   . Recurrent cold sores   . Seasonal allergies   . Syncope and collapse 11/30/2012   Normal EEG.  Vaso vagal syncope    Past Surgical History:  Procedure Laterality Date  . COLON RESECTION N/A 10/14/2013   Procedure: LAPAROSCOPIC MOBILIZATION OF SPLENIC FLEXURE, LAPAROSCOPIC EXTENDED LEFT COLECTOMY;  Surgeon: [name], MD;  Location: [facility];  Service: General;  Laterality: N/A;  . COLONOSCOPY N/A 10/12/2013   Procedure: COLONOSCOPY;  Surgeon: [name], MD;  Location: [facility];  Service: Endoscopy;  Laterality: N/A;  . DILATION AND CURETTAGE, DIAGNOSTIC / THERAPEUTIC  1987  . OPEN REDUCTION INTERNAL FIXATION (ORIF) DISTAL RADIAL FRACTURE Right 08/08/2014   Procedure: OPEN REDUCTION INTERNAL FIXATION (ORIF) DISTAL RADIAL FRACTURE;  Surgeon: Roseanne Kaufman, MD;  Location: [facility];  Service: Orthopedics;  Laterality: Right;  DVR Crosslock, MD available sometime around 1430-1500  . TONSILLECTOMY      Prescriptions Prior to Admission  Medication Sig Dispense Refill Last Dose  . acetaminophen (TYLENOL) 650 MG CR tablet Take 1,300 mg by mouth at bedtime.   08/04/2016 at Unknown time  . atorvastatin (LIPITOR) 40 MG tablet Take 1 tablet (40 mg total) by mouth daily. (Patient taking differently: Take 40 mg by mouth at bedtime. ) 90 tablet 3 08/04/2016 at Unknown time  . buPROPion (WELLBUTRIN SR) 200 MG 12  hr tablet Take 1 tablet (200 mg total) by mouth daily. 30 tablet 0 08/05/2016 at Unknown time  . cetirizine (ZYRTEC) 10 MG tablet Take 10 mg by mouth at bedtime.   Past Month at Unknown time  . citalopram (CELEXA) 20 MG tablet Take 1 tablet (20 mg total) by mouth daily. (Patient taking differently: Take 20 mg by mouth at bedtime. ) 90 tablet 3 08/04/2016 at  Unknown time  . cyclobenzaprine (FLEXERIL) 10 MG tablet Take 1 tablet (10 mg total) by mouth 3 (three) times daily as needed for muscle spasms. 60 tablet 5 Past Week at Unknown time  . fluticasone (FLONASE) 50 MCG/ACT nasal spray Place 2 sprays into both nostrils daily. (Patient taking differently: Place 2 sprays into both nostrils at bedtime. ) 16 g 6 08/04/2016 at Unknown time  . meloxicam (MOBIC) 15 MG tablet TAKE 1 TABLET (15 MG TOTAL) BY MOUTH DAILY. (Patient taking differently: Take 15 mg by mouth at bedtime. ) 90 tablet 3 07/30/2016  . Multiple Vitamins-Minerals (MULTIVITAMIN,TX-MINERALS) tablet Take 1 tablet by mouth daily. POWDER MIX   07/30/2016  . Vitamin D, Ergocalciferol, (DRISDOL) 50000 units CAPS capsule Take 1 capsule (50,000 Units total) by mouth every 7 (seven) days. (Patient taking differently: Take 50,000 Units by mouth every Friday. At bedtime) 4 capsule 0 Past Month at Unknown time  . acyclovir cream (ZOVIRAX) 5 % Apply 1 application topically every 3 (three) hours. (Patient taking differently: Apply 1 application topically every 3 (three) hours as needed (for cold sores). ) 15 g 3 More than a month at Unknown time  . loratadine (CLARITIN) 10 MG tablet Take 10 mg by mouth at bedtime.    More than a month at Unknown time  . valACYclovir (VALTREX) 1000 MG tablet TAKE 2 TABLETS BY MOUTH EVERY 12 HOURS FOR 2 DOSES AS NEEDED FOR FLARE (Patient taking differently: Take 1,000 mg by mouth every 12 (twelve) hours as needed (for cold sores.). ) 30 tablet 11 More than a month at Unknown time   Allergies  Allergen Reactions  . Penicillins Rash and Other (See Comments)    Has patient had a PCN reaction causing immediate rash, facial/tongue/throat swelling, SOB or lightheadedness with hypotension: Unknown Has patient had a PCN reaction causing severe rash involving mucus membranes or skin necrosis: Unknown Has patient had a PCN reaction that required hospitalization: No Has patient had a PCN  reaction occurring within the last 10 years: No If all of the above answers are "NO", then may proceed with Cephalosporin use. Childhood reaction.  . Sulfa Antibiotics Rash    Social History  Substance Use Topics  . Smoking status: Former Smoker    Quit date: 10/27/1993  . Smokeless tobacco: Never Used  . Alcohol use Yes     Comment: one drink per week    Family History  Problem Relation Age of Onset  . Stroke Mother   . Arthritis Mother   . Thyroid disease Mother   . Cancer Father   . Arthritis Sister   . Thyroid disease Sister   . Cancer Maternal Grandmother   . Stroke Maternal Grandfather   . Cancer Maternal Grandfather   . Colon cancer Maternal Grandfather   . Cancer Paternal Grandmother   . Heart disease Paternal Grandmother   . Colon cancer Paternal Grandmother   . Stroke Paternal Grandfather   . Arthritis Sister   . Obesity Sister   . Sudden death Neg Hx   . Hypertension Neg Hx   .  Hyperlipidemia Neg Hx   . Heart attack Neg Hx   . Diabetes Neg Hx   . Rectal cancer Neg Hx   . Stomach cancer Neg Hx   . Esophageal cancer Neg Hx      ROS  Objective:  Physical Exam  Constitutional: She is oriented to person, place, and time. She appears well-developed and well-nourished.  HENT:  Head: Normocephalic and atraumatic.  Eyes: EOM are normal.  Neck: Normal range of motion. Neck supple.  Cardiovascular: Normal rate.   Respiratory: Effort normal and breath sounds normal.  GI: Soft. Bowel sounds are normal.  Musculoskeletal:       Right knee: She exhibits decreased range of motion, swelling, effusion and abnormal alignment. Tenderness found. Medial joint line and lateral joint line tenderness noted.  Neurological: She is alert and oriented to person, place, and time.  Skin: Skin is warm and dry.  Psychiatric: She has a normal mood and affect.    Vital signs in last 24 hours: Temp:  [98.3 F (36.8 C)] 98.3 F (36.8 C) (05/29 1017) Pulse Rate:  [63-73] 69 (05/29  1116) Resp:  [9-20] 10 (05/29 1116) BP: (133-149)/(51-72) 139/51 (05/29 1116) SpO2:  [92 %-100 %] 97 % (05/29 1116) Weight:  [263 lb (119.3 kg)] 263 lb (119.3 kg) (05/29 1017)  Labs:   Estimated body mass index is 35.67 kg/m as calculated from the following:   Height as of this encounter: 6' (1.829 m).   Weight as of this encounter: 263 lb (119.3 kg).   Imaging Review Plain radiographs demonstrate severe degenerative joint disease of the right knee(s). The overall alignment ismild varus. The bone quality appears to be good for age and reported activity level.  Assessment/Plan:  End stage arthritis, right knee   The patient history, physical examination, clinical judgment of the provider and imaging studies are consistent with end stage degenerative joint disease of the right knee(s) and total knee arthroplasty is deemed medically necessary. The treatment options including medical management, injection therapy arthroscopy and arthroplasty were discussed at length. The risks and benefits of total knee arthroplasty were presented and reviewed. The risks due to aseptic loosening, infection, stiffness, patella tracking problems, thromboembolic complications and other imponderables were discussed. The patient acknowledged the explanation, agreed to proceed with the plan and consent was signed. Patient is being admitted for inpatient treatment for surgery, pain control, PT, OT, prophylactic antibiotics, VTE prophylaxis, progressive ambulation and ADL's and discharge planning. The patient is planning to be discharged to skilled nursing facility

## 2016-08-05 NOTE — Anesthesia Procedure Notes (Signed)
Spinal  Patient location during procedure: OR Staffing Anesthesiologist: Montez Hageman Performed: anesthesiologist  Preanesthetic Checklist Completed: patient identified, site marked, surgical consent, pre-op evaluation, timeout performed, IV checked, risks and benefits discussed and monitors and equipment checked Spinal Block Patient position: sitting Prep: Betadine Patient monitoring: heart rate, continuous pulse ox and blood pressure Approach: right paramedian Location: L4-5 Injection technique: single-shot Needle Needle type: Spinocan  Needle gauge: 22 G Needle length: 9 cm Additional Notes Expiration date of kit checked and confirmed. Patient tolerated procedure well, without complications.

## 2016-08-05 NOTE — Progress Notes (Signed)
Orthopedic Tech Progress Note Patient Details:  TYNISA VOHS 04-Sep-1955 [id number]  CPM Right Knee CPM Right Knee: On (Simultaneous filing. User may not have seen previous data.) Right Knee Flexion (Degrees): 90 (Simultaneous filing. User may not have seen previous data.) Right Knee Extension (Degrees): 0 (Simultaneous filing. User may not have seen previous data.) Additional Comments: on at 1520   [name] 08/05/2016, 3:21 PM

## 2016-08-05 NOTE — Anesthesia Preprocedure Evaluation (Signed)
Anesthesia Evaluation  Patient identified by MRN, date of birth, ID band Patient awake    Reviewed: Allergy & Precautions, NPO status , Patient's Chart, lab work & pertinent test results  Airway Mallampati: II  TM Distance: >3 FB Neck ROM: Full    Dental no notable dental hx.    Pulmonary neg pulmonary ROS, former smoker,    Pulmonary exam normal breath sounds clear to auscultation       Cardiovascular negative cardio ROS Normal cardiovascular exam Rhythm:Regular Rate:Normal     Neuro/Psych negative neurological ROS  negative psych ROS   GI/Hepatic negative GI ROS, Neg liver ROS,   Endo/Other  negative endocrine ROS  Renal/GU negative Renal ROS  negative genitourinary   Musculoskeletal negative musculoskeletal ROS (+)   Abdominal   Peds negative pediatric ROS (+)  Hematology negative hematology ROS (+)   Anesthesia Other Findings   Reproductive/Obstetrics negative OB ROS                             Anesthesia Physical Anesthesia Plan  ASA: II  Anesthesia Plan: Spinal   Post-op Pain Management:  Regional for Post-op pain   Induction: Intravenous  Airway Management Planned:   Additional Equipment:   Intra-op Plan:   Post-operative Plan: Extubation in OR  Informed Consent: I have reviewed the patients History and Physical, chart, labs and discussed the procedure including the risks, benefits and alternatives for the proposed anesthesia with the patient or authorized representative who has indicated his/her understanding and acceptance.   Dental advisory given  Plan Discussed with: CRNA  Anesthesia Plan Comments: (Adductor canal block)        Anesthesia Quick Evaluation

## 2016-08-06 ENCOUNTER — Encounter (HOSPITAL_COMMUNITY): Payer: Self-pay | Admitting: General Practice

## 2016-08-06 LAB — CBC
HCT: 29.7 % — ABNORMAL LOW (ref 36.0–46.0)
HCT: 31.3 % — ABNORMAL LOW (ref 36.0–46.0)
Hemoglobin: 9.9 g/dL — ABNORMAL LOW (ref 12.0–15.0)
Hemoglobin: 10 g/dL — ABNORMAL LOW (ref 12.0–15.0)
MCH: 30.1 pg (ref 26.0–34.0)
MCH: 28.7 pg (ref 26.0–34.0)
MCHC: 33.3 g/dL (ref 30.0–36.0)
MCHC: 31.9 g/dL (ref 30.0–36.0)
MCV: 90.3 fL (ref 78.0–100.0)
MCV: 89.7 fL (ref 78.0–100.0)
Platelets: 174 10*3/uL (ref 150–400)
Platelets: 189 10*3/uL (ref 150–400)
RBC: 3.29 MIL/uL — ABNORMAL LOW (ref 3.87–5.11)
RBC: 3.49 MIL/uL — ABNORMAL LOW (ref 3.87–5.11)
RDW: 13.6 % (ref 11.5–15.5)
RDW: 13.5 % (ref 11.5–15.5)
WBC: 7.3 10*3/uL (ref 4.0–10.5)
WBC: 9.9 10*3/uL (ref 4.0–10.5)

## 2016-08-06 LAB — BASIC METABOLIC PANEL
Anion gap: 4 — ABNORMAL LOW (ref 5–15)
BUN: 20 mg/dL (ref 6–20)
CO2: 25 mmol/L (ref 22–32)
Calcium: 8.4 mg/dL — ABNORMAL LOW (ref 8.9–10.3)
Chloride: 107 mmol/L (ref 101–111)
Creatinine, Ser: 0.87 mg/dL (ref 0.44–1.00)
GFR calc non Af Amer: >60 mL/min (ref >60)
Glucose, Bld: 157 mg/dL — ABNORMAL HIGH (ref 65–99)
POTASSIUM: 4.6 mmol/L (ref 3.5–5.1)
SODIUM: 136 mmol/L (ref 135–145)

## 2016-08-06 LAB — GLUCOSE, CAPILLARY: GLUCOSE-CAPILLARY: 155 mg/dL — AB (ref 65–99)

## 2016-08-06 MED ORDER — SODIUM CHLORIDE 0.9 % IV BOLUS (SEPSIS)
500.0000 mL | Freq: Once | INTRAVENOUS | Status: AC
  Administered 2016-08-06: 500 mL via INTRAVENOUS

## 2016-08-06 MED ORDER — PANTOPRAZOLE SODIUM 40 MG PO TBEC
40.0000 mg | DELAYED_RELEASE_TABLET | Freq: Every day | ORAL | Status: DC
  Administered 2016-08-06 – 2016-08-10 (×5): 40 mg via ORAL
  Filled 2016-08-06 (×5): qty 1

## 2016-08-06 NOTE — Progress Notes (Signed)
Orthopedic Tech Progress Note Patient Details:  Monique Gonzalez [birth year] [id number]  [name], female   DOB: December 03, 1955, 61 y.o.   MRN: [id number] Pt unable to get in cpm due to extenuating circumstances.  Karolee Stamps 08/06/2016, 5:20 AM

## 2016-08-06 NOTE — Consult Note (Signed)
   [facility] CM Inpatient Consult   08/06/2016  JESSAMINE BARCIA 1955/08/10 [id number]    Came to visit Ms. Kinsel on behalf of Link to [facility] Care Management program on behalf of [employer] employees/dependents with [facility] insurance. Discussed Link to Wellness program. Denies having any Link to Wellness needs. Provided brochure and contact information with 24-hr nurse line magnet.    Agreeable to post hospital discharge call. Confirmed best contact number as [phone].   Marthenia Rolling, MSN-Ed, RN,BSN [facility] Liaison [phone]

## 2016-08-06 NOTE — Care Management Note (Signed)
Case Management Note  Patient Details  Name: Monique Gonzalez MRN: [id number] Date of Birth: 1955-09-06  Subjective/Objective: 61 yr old female admitted with osteoarthritis of her right knee. Patient underwent a right total knee arthroplasty.                Action/Plan: Case manager spoke with patient and her daughter concerning discharge plan and DME needs. Patient was preoperatively setup with [facility], no changes. She says she has RW and 3in1 from her mom and will not be using CPM. Patient will have family support at discharge.    Expected Discharge Date:    08/07/16             Expected Discharge Plan:  [place]  In-House Referral:  NA  Discharge planning Services  CM Consult  Post Acute Care Choice:  Home Health Choice offered to:  Patient, Adult Children  DME Arranged:  N/A (has RW and 3in1) DME Agency:  NA  HH Arranged:  PT [place] Agency:  [facility] (formerly [employer])  Status of Service:  Completed, signed off  If discussed at [employer], dates discussed:    Additional Comments:  Ninfa Meeker, RN 08/06/2016, 1:54 PM

## 2016-08-06 NOTE — Evaluation (Signed)
Occupational Therapy Evaluation Patient Details Name: Monique Gonzalez MRN: [id number] DOB: April 06, 1955 Today's Date: 08/06/2016    History of Present Illness   Pt is a 61 yo female with c/o R knee pain dx with end stage R knee OA, s/p R TKA 08/05/16 PMH significant for syncope, OA, and osteopenia.    Clinical Impression   PTA, pt was independent and living with her sisters. Pt currently performs ADLs and functional mobility with Min guard - Min A. Pt limited by decreased activity tolerance with BP of 94/75 and SpO2 81 on room air after activity. Pt reports no dizziness, nausea, or SOB. Pt would benefit from acute OT to increase her safety and independence with ADLs and functional mobility. Recommend dc home with initial 24 hour supervision pending pt progress and increase in activity to[place].     Follow Up Recommendations  No OT follow up;Supervision/Assistance - 24 hour    Equipment Recommendations  None recommended by OT    Recommendations for Other Services PT consult     Precautions / Restrictions Precautions Precautions: Fall; knee Required Braces or Orthoses: Knee Immobilizer - Right Restrictions Weight Bearing Restrictions: Yes RLE Weight Bearing: Weight bearing as tolerated      Mobility Bed Mobility Overal bed mobility: Modified Independent             General bed mobility comments: used bed rails  Transfers Overall transfer level: Needs assistance Equipment used: Rolling walker (2 wheeled) Transfers: Sit to/from Stand Sit to Stand: Min guard;Min assist         General transfer comment: Min guard for sit>stand. Min A to descend to reclienr after activity.     Balance Overall balance assessment: Needs assistance Sitting-balance support: Feet supported;No upper extremity supported Sitting balance-Leahy Scale: Good     Standing balance support: No upper extremity supported;During functional activity Standing balance-Leahy Scale: Fair Standing balance  comment: Good standing balacne at sink                           ADL either performed or assessed with clinical judgement   ADL Overall ADL's : Needs assistance/impaired Eating/Feeding: Set up;Sitting   Grooming: Oral care;Wash/dry face;Min guard;Standing Grooming Details (indicate cue type and reason): Pt demosntrated good balance at sink to complete grooming Upper Body Bathing: Set up;Sitting   Lower Body Bathing: Minimal assistance;Min guard;Sit to/from stand Lower Body Bathing Details (indicate cue type and reason): Min guard - Min A for standing balance while performing sponge bath at sink Upper Body Dressing : Set up;Sitting   Lower Body Dressing: Min guard;Sit to/from stand Lower Body Dressing Details (indicate cue type and reason): Pt demonstrated good ROM to adjust both socks. Feel she will progress well with LB dressing. Pt and daughter also demosntrated understanding of donning/doffing KI Toilet Transfer: Minimal assistance;Ambulation;RW (Simulated to recliner)           Functional mobility during ADLs: Min guard;Rolling walker General ADL Comments: Pt Demonstrating good functional performance with Min gaurd-Min A. Pt reports syncopal episode this AM. BP prior to activity 101/69. BP after activity 94/75. Feel once pt demosntrates better activity tolerance, she could dc home with family.      Vision Baseline Vision/History: Wears glasses Wears Glasses: Reading only Patient Visual Report: No change from baseline       Perception     Praxis      Pertinent Vitals/Pain Pain Assessment: 0-10 Pain Score: 5  Pain Location: R  knee Pain Descriptors / Indicators: Constant;Discomfort     Hand Dominance Right   Extremity/Trunk Assessment Upper Extremity Assessment Upper Extremity Assessment: Overall WFL for tasks assessed   Lower Extremity Assessment Lower Extremity Assessment: Defer to PT evaluation;RLE deficits/detail RLE Deficits / Details: s/p TKA    Cervical / Trunk Assessment Cervical / Trunk Assessment: Normal   Communication Communication Communication: No difficulties   Cognition Arousal/Alertness: Awake/alert Behavior During Therapy: WFL for tasks assessed/performed Overall Cognitive Status: Within Functional Limits for tasks assessed                                     General Comments  Prior to activity BP 101/69 and SpO2 on room air 96. After activity on room air, BP 94/75 and SpO2 81. Pt performed purse lip breathing while seated and elevated SpO2 to 98 on room air. Placed pt on 2L O2 at end of session.  Pt reports no dizziness, nausea, or SOB.     Exercises     Shoulder Instructions      Home Living Family/patient expects to be discharged to:: Private residence Living Arrangements: Other relatives (Two sisters) Available Help at Discharge: Family;Available 24 hours/day (Daughter coming to stay till the end of June) Type of Home: House Home Access: Stairs to enter [employer] of Steps: 2 Entrance Stairs-Rails: None Home Layout: Multi-level;Able to live on main level with bedroom/bathroom (Also has stair lift) Alternate Level Stairs-Number of Steps: 15 Alternate Level Stairs-Rails: Right Bathroom Shower/Tub: Occupational psychologist: Standard     Home Equipment: Shower seat - built in;Walker - 2 wheels;Hand held shower head;Toilet riser;Grab bars - tub/shower;Cane - single point          Prior Functioning/Environment Level of Independence: Independent with assistive device(s)        Comments: Pt using single point cane prior to surgery. Independent with ADLs, IADLs, and driving        OT Problem List: Decreased strength;Decreased range of motion;Decreased activity tolerance;Impaired balance (sitting and/or standing);Decreased safety awareness;Decreased knowledge of use of DME or AE;Decreased knowledge of precautions;Pain      OT Treatment/Interventions:  Self-care/ADL training;Therapeutic exercise;Energy conservation;DME and/or AE instruction;Therapeutic activities;Patient/family education    OT Goals(Current goals can be found in the care plan section) Acute Rehab OT Goals Patient Stated Goal: Go home  OT Goal Formulation: With patient Time For Goal Achievement: 08/20/16 Potential to Achieve Goals: Good  OT Frequency: Min 2X/week   Barriers to D/C:            Co-evaluation              AM-PAC PT "6 Clicks" Daily Activity     Outcome Measure Help from another person eating meals?: None Help from another person taking care of personal grooming?: A Little Help from another person toileting, which includes using toliet, bedpan, or urinal?: A Little Help from another person bathing (including washing, rinsing, drying)?: A Little Help from another person to put on and taking off regular upper body clothing?: None Help from another person to put on and taking off regular lower body clothing?: A Little 6 Click Score: 20   End of Session Equipment Utilized During Treatment: Gait belt;Rolling walker;Right knee immobilizer Nurse Communication: Mobility status;Other (comment) (BP)  Activity Tolerance: Patient tolerated treatment well Patient left: in chair;with call bell/phone within reach;with family/visitor present  OT Visit Diagnosis: Unsteadiness on feet (R26.81);Other  abnormalities of gait and mobility (R26.89);Muscle weakness (generalized) (M62.81);Pain Pain - Right/Left: Right Pain - part of body: Knee                Time: 6734-1937 OT Time Calculation (min): 37 min Charges:  OT General Charges $OT Visit: 1 Procedure OT Evaluation $OT Eval Low Complexity: 1 Procedure OT Treatments $Self Care/Home Management : 8-22 mins G-Codes:     [employer], OTR/L [place] 08/06/2016, 8:48 AM

## 2016-08-06 NOTE — Progress Notes (Signed)
Physical Therapy Treatment Patient Details Name: Monique Gonzalez MRN: [id number] DOB: [birth year] Today's Date: 08/06/2016    History of Present Illness Pt is a 61 yo female with c/o R knee pain dx with end stage R knee OA, s/p R TKA 08/05/16 PMH significant for syncope, OA, and osteopenia.     PT Comments    Pt was able to full participate in afternoon PT treatment as her BP has normalized and she in no longer experiencing any dizziness. Pt currently mod I for bed mobility, min guard for transfers with RW and minA for ambulation of 200 feet with RW. Pt requires skilled PT to progress gait training and complete stair training in order to safely discharge home tomorrow.    Follow Up Recommendations  DC plan and follow up therapy as arranged by surgeon     Equipment Recommendations  None recommended by PT    Recommendations for Other Services OT consult     Precautions / Restrictions Precautions Precautions: Fall;Knee Precaution Booklet Issued: Yes (comment) Precaution Comments: orthostatic Required Braces or Orthoses: Knee Immobilizer - Right Knee Immobilizer - Right: Other (comment) (no order but used for walking) Restrictions Weight Bearing Restrictions: Yes RLE Weight Bearing: Weight bearing as tolerated    Mobility  Bed Mobility Overal bed mobility: Modified Independent             General bed mobility comments: uses bed rails to come to EoB  Transfers Overall transfer level: Needs assistance Equipment used: Rolling walker (2 wheeled) Transfers: Sit to/from Stand Sit to Stand: Min guard         General transfer comment: min guard for safety  Ambulation/Gait Ambulation/Gait assistance: Min assist Ambulation Distance (Feet): 200 Feet Assistive device: Rolling walker (2 wheeled) Gait Pattern/deviations: Step-to pattern;Decreased stance time - right;Decreased weight shift to right;Shuffle;Antalgic;Trunk flexed Gait velocity: slowed Gait velocity  interpretation: Below normal speed for age/gender General Gait Details: slow, steady gait, pt utilizes circumduction to advance R LE due to placement of KI vc for upright posture and anterior pelvic tilt and increased heel strike on R heel.       Balance Overall balance assessment: Needs assistance Sitting-balance support: Feet supported;No upper extremity supported Sitting balance-Leahy Scale: Good     Standing balance support: No upper extremity supported;During functional activity Standing balance-Leahy Scale: Good Standing balance comment: able to adjust her gown in standing with no UE support                            Cognition Arousal/Alertness: Awake/alert Behavior During Therapy: WFL for tasks assessed/performed Overall Cognitive Status: Within Functional Limits for tasks assessed                                        Exercises Total Joint Exercises Ankle Circles/Pumps: AROM;Both;10 reps;Seated Quad Sets: AROM;Right;10 reps;Supine Short Arc Quad: AROM;Right;10 reps;Supine Heel Slides: Right;10 reps;Supine;AROM Hip ABduction/ADduction: AROM;Right;10 reps;Supine Straight Leg Raises: AROM;Right;10 reps;Supine Goniometric ROM: 6 degrees to 95 degrees AAROM measured supine in bed    General Comments General comments (skin integrity, edema, etc.): BP seated EoB prior to ambulation 129/53, HR 64 bpm, BP after activity 134/53 HR 82bpm      Pertinent Vitals/Pain Pain Assessment: 0-10 Pain Score: 6  Pain Location: R knee Pain Descriptors / Indicators: Constant;Discomfort Pain Intervention(s): Monitored during session    Home Living  Family/patient expects to be discharged to:: Private residence Living Arrangements: Other relatives                      PT Goals (current goals can now be found in the care plan section) Acute Rehab PT Goals Patient Stated Goal: Go home  PT Goal Formulation: With patient Time For Goal Achievement:  08/20/16 Potential to Achieve Goals: Good Progress towards PT goals: Progressing toward goals    Frequency    7X/week      PT Plan Current plan remains appropriate       AM-PAC PT "6 Clicks" Daily Activity  Outcome Measure  Difficulty turning over in bed (including adjusting bedclothes, sheets and blankets)?: A Little Difficulty moving from lying on back to sitting on the side of the bed? : A Little Difficulty sitting down on and standing up from a chair with arms (e.g., wheelchair, bedside commode, etc,.)?: A Little Help needed moving to and from a bed to chair (including a wheelchair)?: A Little Help needed walking in hospital room?: A Little Help needed climbing 3-5 steps with a railing? : A Lot 6 Click Score: 17    End of Session Equipment Utilized During Treatment: Gait belt;Right knee immobilizer Activity Tolerance:  (limited by drop in BP with activity) Patient left: in chair;with call bell/phone within reach Nurse Communication: Other (comment);Mobility status;Patient requests pain meds;Weight bearing status;Precautions (drop in BP ) PT Visit Diagnosis: Unsteadiness on feet (R26.81);Other abnormalities of gait and mobility (R26.89);Muscle weakness (generalized) (M62.81);Pain;Dizziness and giddiness (R42) Pain - Right/Left: Right Pain - part of body: Knee     Time: 1545-1620 PT Time Calculation (min) (ACUTE ONLY): 35 min  Charges:  $Gait Training: 8-22 mins $Therapeutic Exercise: 8-22 mins                    G Codes:       [name] B. Migdalia Dk PT, DPT Acute Rehabilitation  [phone] Pager [phone]     [place] 08/06/2016, 5:23 PM

## 2016-08-06 NOTE — Op Note (Signed)
NAME:  Monique Gonzalez, [name]:  MEDICAL RECORD NO.:  [id number]  LOCATION:                                 FACILITY:  PHYSICIAN:  Lind Guest. Ninfa Linden, M.D.DATE OF BIRTH:  DATE OF PROCEDURE:  08/05/2016 DATE OF DISCHARGE:                              OPERATIVE REPORT   PREOPERATIVE DIAGNOSIS:  Primary osteoarthritis and degenerative joint disease, right knee.  POSTOPERATIVE DIAGNOSIS:  Primary osteoarthritis and degenerative joint disease, right knee.  PROCEDURE:  Right total knee arthroplasty.  IMPLANTS:  Stryker Triathlon knee with size 4 femur, size 5 tibial tray, 13 mm fix-bearing polyethylene insert, size 29 patellar button.  SURGEON:  Lind Guest. Ninfa Linden, M.D.  ASSISTANT:  Erskine Emery, P.A.  ANESTHESIA: 1. Right lower extremity adductor canal block. 2. Spinal.  TOURNIQUET TIME:  Under 1 hour.  BLOOD LOSS:  Less than 200 mL.  ANTIBIOTICS:  900 mg of IV clindamycin.  COMPLICATIONS:  None.  INDICATIONS:  Ms. Offner is a very pleasant, 61 year old obese individual with debilitating arthritis involving her right knee.  She has tried and failed all forms of conservative treatment.  Her knee has a varus deformity with significant osteoarthritis of the medial compartment and patellofemoral joint.  Her pain is daily and it has detrimentally affected her activities of daily living, her quality of life, and her mobility.  At this point, she does wish to proceed with a total knee arthroplasty.  She understands the risk of acute blood loss anemia, nerve and vessel injury, fracture, infection, and DVT.  She understands all these are heightened given her obesity as well.  She understands our goals are to decrease pain, improve mobility, and overall improve quality of life.  PROCEDURE DESCRIPTION:  After informed consent was obtained, appropriate right knee was marked.  Anesthesia obtained an adductor canal block. She was then brought to  the operating room and spinal anesthesia was obtained.  She was laid in supine position.  A nonsterile tourniquet was placed around her upper right thigh and right thigh, knee, leg, and foot were prepped and draped with DuraPrep and sterile drapes.  Time-out was called and she was identified as correct patient, correct right knee. We then used an Esmarch to wrap out the leg and tourniquet was inflated to 300 mmHg.  I then made a midline incision over the patella and carried this proximally and distally.  We dissected down the knee joint, carried out a medial parapatellar arthrotomy, finding a moderate joint effusion, and significant arthritis throughout her knee.  We removed remnants of ACL, PCL, medial and lateral meniscus.  We then had her knee in a flexed position and then using the extramedullary cutting guide, set our tibia cut for a neutral slope, correcting for varus and valgus and taking 9 mm off the high side.  We made this cut without difficulty. We then went to the femur and with the knee in a flexed position, used an intramedullary guide for the femur, setting our distal femoral cut for 10 mm, setting a rotation at 5 degrees, externally rotated for right knee.  We made the distal femoral cut without difficulty and brought  the knee back down to full extension and with an 11 mm extension block, I felt like we were close to full extension.  We then went back to the femur and put our femoral sizing guide based off the epicondylar axis, but that set at 3 degrees right externally rotated, we were able to choose a size 4 femur.  We put our 4-in-1 cutting block for size 4 femur, made our anterior and posterior cuts followed by our chamfer cuts.  We then made our femoral box cut.  We then went back to the tibia and cover the tibial plateau with the size 5 temporary tibial tray.  We were pleased with the coverage of the tibial plateau.  We set our rotation off the tibial tubercle on the  femur and then made our drill cut for universal base plate followed by our keel cut.  With a trial 5 tibia in place followed by the 4 femur, we trialed a 13 mm polyethylene insert.  I was pleased with stability and range of motion with the 13 mm insert.  We then made our patellar cut and drilled 3 holes for patellar button.  We removed all trial instrumentation and then irrigated the knee with normal saline solution.  We then able to a mix our cement and cemented the real Stryker Triathlon universal base plate size 5 for right knee followed by the real size 4 femur for right knee.  We placed our real 13 mm fix-bearing polyethylene insert and cemented our patellar button.  The cement debris was removed from the knee and once the cement had hardened, hemostasis was obtained with electrocautery after the tourniquet was let down and we irrigated the knee with normal saline solution using pulsatile lavage.  We then closed the arthrotomy with interrupted #1 Vicryl suture followed by 0 Vicryl in the deep tissue, 2- 0 Vicryl in the subcutaneous tissue, 4-0 Monocryl subcuticular stitch and Steri-Strips on the skin.  Well-padded sterile dressing was applied. She was taken to the recovery room in stable condition.  All final counts were correct.  There were no complications noted.  Of note, Erskine Emery, PAC, assisted in entire case.  His assistance was crucial for facilitating all aspects of this case.     Lind Guest. Ninfa Linden, M.D.     CYB/MEDQ  D:  08/05/2016  T:  08/06/2016  Job:  [id number]

## 2016-08-06 NOTE — Progress Notes (Signed)
Subjective: 1 Day Post-Op Procedure(s) (LRB): RIGHT TOTAL KNEE ARTHROPLASTY (Right) Patient reports pain as moderate.  Did have syncopal episode this am that may have been pain med related.  Vitals stable now and Hgb 9.9, which is acute blood loss anemia from surgery.  Objective: Vital signs in last 24 hours: Temp:  [97.8 F (36.6 C)-98.7 F (37.1 C)] 98.4 F (36.9 C) (05/30 0456) Pulse Rate:  [56-93] 65 (05/30 0642) Resp:  [8-20] 20 (05/30 0515) BP: (81-149)/(37-97) 117/52 (05/30 0642) SpO2:  [92 %-100 %] 100 % (05/30 0642) Weight:  [263 lb (119.3 kg)] 263 lb (119.3 kg) (05/29 1017)  Intake/Output from previous day: 05/29 0701 - 05/30 0700 In: 1000 [I.V.:1000] Out: 2825 [Urine:2750; Blood:75] Intake/Output this shift: No intake/output data recorded.   Recent Labs  08/06/16 0537 08/06/16 0641  HGB 9.9* 10.0*    Recent Labs  08/06/16 0537 08/06/16 0641  WBC 7.3 9.9  RBC 3.29* 3.49*  HCT 29.7* 31.3*  PLT 174 189    Recent Labs  08/06/16 0537  NA 136  K 4.6  CL 107  CO2 25  BUN 20  CREATININE 0.87  GLUCOSE 157*  CALCIUM 8.4*   No results for input(s): LABPT, INR in the last 72 hours.  Sensation intact distally Intact pulses distally Dorsiflexion/Plantar flexion intact Incision: dressing C/D/I Compartment soft  Assessment/Plan: 1 Day Post-Op Procedure(s) (LRB): RIGHT TOTAL KNEE ARTHROPLASTY (Right) Up with therapy  Disposition depends on progress with therapy.  Mcarthur Rossetti 08/06/2016, 7:21 AM

## 2016-08-06 NOTE — Progress Notes (Signed)
Patient had a syncope episode around 5 am while on BSC;sternal rubs done ;she opened her eyes and was arousable and responded;Initial V/S were BP-109/44;HR-78;CBG-155;O2Sat-96%;Rapid Response Nurse paged and responded; initiated a 500 NS bolus and EKG stat ordered; also paged [employer] doc-on-call who gave orders to discontinue Dilaudid IV PRN; CBC and BMP stat. As of this writing ,Pt.s BP -95/43;HR-76;O2 Sat 96% and is alert and oriented

## 2016-08-06 NOTE — Evaluation (Signed)
Physical Therapy Evaluation Patient Details Name: Monique Gonzalez MRN: [id number] DOB: [birth year] Today's Date: 08/06/2016   History of Present Illness  Pt is a 61 yo female with c/o R knee pain dx with end stage R knee OA, s/p R TKA 08/05/16 PMH significant for syncope, OA, and osteopenia.   Clinical Impression  Pt is s/p TKA resulting in the deficits listed below (see PT Problem List). Pt is limited by orthostatic hypotension and pain. Pt mobility currently, modA for transfers and minA for ambulation of 60 feet with RW. Pt requires close chair follow for ambulation for safety if pt BP drops.  Pt will benefit from skilled PT to increase their independence and safety with mobility to allow discharge to the venue listed below.      Follow Up Recommendations DC plan and follow up therapy as arranged by surgeon    Equipment Recommendations  None recommended by PT    Recommendations for Other Services OT consult     Precautions / Restrictions Precautions Precautions: Fall;Knee Precaution Booklet Issued: Yes (comment) Precaution Comments: orthostatic Required Braces or Orthoses: Knee Immobilizer - Right Knee Immobilizer - Right: Other (comment) (no order but used for walking) Restrictions Weight Bearing Restrictions: Yes RLE Weight Bearing: Weight bearing as tolerated      Mobility  Bed Mobility               General bed mobility comments: in recliner at entry  Transfers Overall transfer level: Needs assistance Equipment used: Rolling walker (2 wheeled) Transfers: Sit to/from Stand Sit to Stand: Min guard;Mod assist         General transfer comment: min guard for sit>stand, modA for stand<sit as pt feeling dizzy   Ambulation/Gait Ambulation/Gait assistance: Min assist Ambulation Distance (Feet): 60 Feet Assistive device: Rolling walker (2 wheeled) Gait Pattern/deviations: Step-to pattern;Decreased stance time - right;Decreased weight shift to  right;Shuffle;Antalgic;Trunk flexed Gait velocity: slowed Gait velocity interpretation: Below normal speed for age/gender General Gait Details: slow, steady gait vc for upright posture and anterior pelvic tilt and increased heel strike on R heel. Pt became very dizzy and asked to sit down after ambulating 60 feet        Balance Overall balance assessment: Needs assistance Sitting-balance support: Feet supported;No upper extremity supported Sitting balance-Leahy Scale: Good     Standing balance support: No upper extremity supported;During functional activity Standing balance-Leahy Scale: Fair Standing balance comment: able to adjust her gown in standing with no UE support                             Pertinent Vitals/Pain Pain Assessment: 0-10 Pain Score: 7  Pain Location: R knee Pain Descriptors / Indicators: Constant;Discomfort Pain Intervention(s): Limited activity within patient's tolerance;Monitored during session;Patient requesting pain meds-RN notified;Ice applied    Home Living Family/patient expects to be discharged to:: Private residence Living Arrangements: Other relatives (Two sisters) Available Help at Discharge: Family;Available 24 hours/day (Daughter coming to stay till the end of June) Type of Home: House Home Access: Stairs to enter Entrance Stairs-Rails: None Technical brewer of Steps: 2 Home Layout: Multi-level;Able to live on main level with bedroom/bathroom (Also has stair lift) Home Equipment: Shower seat - built in;Walker - 2 wheels;Hand held shower head;Toilet riser;Grab bars - tub/shower;Cane - single point      Prior Function Level of Independence: Independent with assistive device(s)         Comments: Pt using single point cane prior  to surgery. Independent with ADLs, IADLs, and driving     Hand Dominance   Dominant Hand: Right    Extremity/Trunk Assessment   Upper Extremity Assessment Upper Extremity Assessment: Overall  WFL for tasks assessed    Lower Extremity Assessment Lower Extremity Assessment: RLE deficits/detail RLE Deficits / Details: s/p TKA, knee and hip ROM and strength limited by surgical procedure RLE: Unable to fully assess due to pain    Cervical / Trunk Assessment Cervical / Trunk Assessment: Normal  Communication   Communication: No difficulties  Cognition Arousal/Alertness: Awake/alert Behavior During Therapy: WFL for tasks assessed/performed Overall Cognitive Status: Within Functional Limits for tasks assessed                                        General Comments General comments (skin integrity, edema, etc.): BP at beginning of session 118/56 HR 67 bpm, SaO2 on RA 100%, pt reported dizziness after ambulation and vitals taken BP 94/48 HR 61 and SaO2 on RA 100%, After resting 5 minutes BP 101/50 HR 65 SaO2 on RA 100% pt         Assessment/Plan    PT Assessment Patient needs continued PT services  PT Problem List Decreased strength;Decreased range of motion;Decreased activity tolerance;Decreased mobility;Pain       PT Treatment Interventions DME instruction;Gait training;Stair training;Functional mobility training;Therapeutic activities;Therapeutic exercise;Balance training;Patient/family education    PT Goals (Current goals can be found in the Care Plan section)  Acute Rehab PT Goals Patient Stated Goal: Go home  PT Goal Formulation: With patient Time For Goal Achievement: 08/20/16 Potential to Achieve Goals: Good    Frequency 7X/week    AM-PAC PT "6 Clicks" Daily Activity  Outcome Measure Difficulty turning over in bed (including adjusting bedclothes, sheets and blankets)?: A Lot Difficulty moving from lying on back to sitting on the side of the bed? : A Lot Difficulty sitting down on and standing up from a chair with arms (e.g., wheelchair, bedside commode, etc,.)?: A Lot Help needed moving to and from a bed to chair (including a wheelchair)?: A  Little Help needed walking in hospital room?: A Little Help needed climbing 3-5 steps with a railing? : A Lot 6 Click Score: 14    End of Session Equipment Utilized During Treatment: Gait belt;Right knee immobilizer Activity Tolerance:  (limited by drop in BP with activity) Patient left: in chair;with call bell/phone within reach Nurse Communication: Other (comment);Mobility status;Patient requests pain meds;Weight bearing status;Precautions (drop in BP ) PT Visit Diagnosis: Unsteadiness on feet (R26.81);Other abnormalities of gait and mobility (R26.89);Muscle weakness (generalized) (M62.81);Pain;Dizziness and giddiness (R42) Pain - Right/Left: Right Pain - part of body: Knee    Time: 7619-5093 PT Time Calculation (min) (ACUTE ONLY): 53 min   Charges:   PT Evaluation $PT Eval Low Complexity: 1 Procedure PT Treatments $Gait Training: 23-37 mins $Therapeutic Exercise: 8-22 mins   PT G Codes:        Lyndle Pang B. Migdalia Dk PT, DPT Acute Rehabilitation  [phone] Pager [phone]    [place] 08/06/2016, 12:53 PM

## 2016-08-06 NOTE — Significant Event (Signed)
Rapid Response Event Note  Called by Vladimir Faster, Rn for pt     Who had an apparent syncopal episode while on BS commond  Overview: Time Called: 0509 Arrival Time: 0512 Event Type: Hypotension  Initial Focused Assessment:  Upon my arrival pt is in reverse trendelenburg in bed, awake, oriented times 4, pale and clammy.  81/37  HR 65  RR 20 with RA sats 100%.  Pt denies chest pain.     Interventions:  NS 500 cc bolus. EKG: NSR rate 65, Staff notified Dr Marlou Sa of event and  Pain meds held, BMET, CBC per MD order  Plan of Care (if not transferred): Monitor BP and notify MD SBP <90,  0630  F/U     117/52  61  20    100% on 1l [place] Event Summary: Name of Physician Notified: Dr Marlou Sa at      at    Outcome: Stayed in room and stabalized  Event End Time: 0630  Ester Rink

## 2016-08-07 NOTE — Progress Notes (Signed)
RN called for BP 62/46 after getting up to go to bathroom. On arrival pt lying supine in bed, skin warm and dry, cool cloth to her forehead, denies any complaints. Alert and oriented x4. Pt states this has happened several times since her surgery, denies these symptoms prior to surgery. States she can fell it when it comes on, "every thing gets swimmy." Pt reports two identical events occurred through out the day once during PT session. 500 NS bolus was infusing on my arrival, new BP 121/42, after bolus 125/50. SBP has been ranging between 80-120's. I advised RN to call MD to make aware of event. As well as call me as needed. No interventions from this RRT at this time

## 2016-08-07 NOTE — Progress Notes (Signed)
Nurse Tech assisted the patient to the bathroom when suddenly she felt dizzy while sitting on the toilet seat; initial BP-62/46; Rapid Response RN paged & initiated a 500cc NS bolus order; co-staff RNs helped the patient placed back on the bed via wheelchair.BP was re-taken and it was 121/42 while lying down the bed and was alert and oriented; Called and reported event to [employer] with no orders given.BP was 125/50 around 00:41am and pt was given oxy IR pain med as she complained of right knee surgical pain.

## 2016-08-07 NOTE — Progress Notes (Signed)
Orthopedic Tech Progress Note Patient Details:  Monique Gonzalez Sep 11, 1955 [id number]  CPM Right Knee CPM Right Knee: On Right Knee Flexion (Degrees): 60 Right Knee Extension (Degrees): 0 Additional Comments: placed pt on cpm at 0-60 degrees on pt right leg/knee.  pt tolerated well. right knee.   Kristopher Oppenheim 08/07/2016, 1:28 PM

## 2016-08-07 NOTE — Progress Notes (Signed)
Subjective: 2 Days Post-Op Procedure(s) (LRB): RIGHT TOTAL KNEE ARTHROPLASTY (Right) Patient reports pain as moderate.  Still with occasional syncopal episodes when up, but vitals signs and labs actually stable otherwise.  Hgb 10.   Objective: Vital signs in last 24 hours: Temp:  [98.2 F (36.8 C)-100 F (37.8 C)] 100 F (37.8 C) (05/31 0657) Pulse Rate:  [52-76] 76 (05/31 0657) Resp:  [16-24] 20 (05/31 0657) BP: (62-126)/(41-63) 126/45 (05/31 0657) SpO2:  [97 %-100 %] 97 % (05/31 0657)  Intake/Output from previous day: 05/30 0701 - 05/31 0700 In: 480 [P.O.:480] Out: -  Intake/Output this shift: No intake/output data recorded.   Recent Labs  08/06/16 0537 08/06/16 0641  HGB 9.9* 10.0*    Recent Labs  08/06/16 0537 08/06/16 0641  WBC 7.3 9.9  RBC 3.29* 3.49*  HCT 29.7* 31.3*  PLT 174 189    Recent Labs  08/06/16 0537  NA 136  K 4.6  CL 107  CO2 25  BUN 20  CREATININE 0.87  GLUCOSE 157*  CALCIUM 8.4*   No results for input(s): LABPT, INR in the last 72 hours.  Sensation intact distally Intact pulses distally Dorsiflexion/Plantar flexion intact Incision: moderate drainage No cellulitis present Compartment soft  Assessment/Plan: 2 Days Post-Op Procedure(s) (LRB): RIGHT TOTAL KNEE ARTHROPLASTY (Right) Up with therapy Plan for discharge tomorrow Discharge home with home health  Mcarthur Rossetti 08/07/2016, 9:48 AM

## 2016-08-07 NOTE — Progress Notes (Addendum)
Physical Therapy Treatment Patient Details Name: Monique Gonzalez MRN: [id number] DOB: 1955-06-12 Today's Date: 08/07/2016    History of Present Illness Pt is a 61 yo female with c/o R knee pain dx with end stage R knee OA, s/p R TKA 08/05/16 PMH significant for syncope, OA, and osteopenia.     PT Comments    Pt is making good progress towards her goals. Pt currently minA for bed mobility, min guard for transfers to RW and minA for ambulation of 75 feet with RW. Pt requires skilled PT to progress gait and stair training and to improve LE strength, ROM and endurance to safely mobilize in her discharge environment.    Follow Up Recommendations  DC plan and follow up therapy as arranged by surgeon     Equipment Recommendations  None recommended by PT    Recommendations for Other Services       Precautions / Restrictions Precautions Precautions: Fall;Knee Precaution Booklet Issued: No Precaution Comments: Orthostatic this session  Restrictions Weight Bearing Restrictions: Yes RLE Weight Bearing: Weight bearing as tolerated    Mobility  Bed Mobility Overal bed mobility: Needs Assistance Bed Mobility: Supine to Sit;Sit to Supine     Supine to sit: Min assist Sit to supine: Min guard   General bed mobility comments: HOB elevated with use of bed rails  Transfers Overall transfer level: Needs assistance Equipment used: Rolling walker (2 wheeled) Transfers: Sit to/from Stand Sit to Stand: Min guard         General transfer comment: Min guard for safety. Good hand placement and technique  Ambulation/Gait Ambulation/Gait assistance: Min assist Ambulation Distance (Feet): 75 Feet Assistive device: Rolling walker (2 wheeled) Gait Pattern/deviations: Step-to pattern;Decreased step length - left;Decreased stance time - right;Decreased dorsiflexion - right;Antalgic;Shuffle;Trunk flexed (circumduction ) Gait velocity: slowed Gait velocity interpretation: Below normal speed for  age/gender General Gait Details: min guard for safety, pt continues to circumduct R hip to advance R LE vc for heel strike, upright posture, increased dorsiflexion and anterior pelvic tilt       Balance Overall balance assessment: Needs assistance Sitting-balance support: Feet supported;No upper extremity supported Sitting balance-Leahy Scale: Good     Standing balance support: Bilateral upper extremity supported Standing balance-Leahy Scale: Fair Standing balance comment: RW for support                            Cognition Arousal/Alertness: Awake/alert Behavior During Therapy: WFL for tasks assessed/performed Overall Cognitive Status: Within Functional Limits for tasks assessed                                        Exercises Total Joint Exercises Ankle Circles/Pumps: AROM;Both;10 reps;Supine Quad Sets: AROM;Right;10 reps;Supine (ankle supported on pillows) Heel Slides: AROM;Right;10 reps;Supine Long Arc Quad: AROM;Right;10 reps;Seated Knee Flexion: AROM;Right;10 reps;Seated Goniometric ROM: 2 degrees to 85 degrees R knee ROM    General Comments General comments (skin integrity, edema, etc.): BP 132/61 before activity, no symptoms throughout session      Pertinent Vitals/Pain Pain Assessment: 0-10 Pain Score: 5  Faces Pain Scale: Hurts even more Pain Location: R knee Pain Descriptors / Indicators: Sore;Constant;Grimacing;Guarding Pain Intervention(s): Monitored during session;Premedicated before session           PT Goals (current goals can now be found in the care plan section) Acute Rehab PT Goals Patient Stated  Goal: Go home  PT Goal Formulation: With patient Time For Goal Achievement: 08/20/16 Potential to Achieve Goals: Good Progress towards PT goals: Progressing toward goals    Frequency    7X/week      PT Plan Current plan remains appropriate       AM-PAC PT "6 Clicks" Daily Activity  Outcome Measure   Difficulty turning over in bed (including adjusting bedclothes, sheets and blankets)?: A Little Difficulty moving from lying on back to sitting on the side of the bed? : A Little Difficulty sitting down on and standing up from a chair with arms (e.g., wheelchair, bedside commode, etc,.)?: A Lot Help needed moving to and from a bed to chair (including a wheelchair)?: A Little Help needed walking in hospital room?: A Lot Help needed climbing 3-5 steps with a railing? : A Lot 6 Click Score: 15    End of Session Equipment Utilized During Treatment: Gait belt;Right knee immobilizer Activity Tolerance: Patient tolerated treatment well Patient left: with call bell/phone within reach;with family/visitor present;in chair Nurse Communication: Mobility status;Other (comment) PT Visit Diagnosis: Unsteadiness on feet (R26.81);Other abnormalities of gait and mobility (R26.89);Muscle weakness (generalized) (M62.81);Pain;Dizziness and giddiness (R42) Pain - Right/Left: Right Pain - part of body: Knee     Time: 7342-8768 PT Time Calculation (min) (ACUTE ONLY): 25 min  Charges:  $Gait Training: 8-22 mins $Therapeutic Exercise: 8-22 mins $Therapeutic Activity: 8-22 mins                    G Codes:       Neal Oshea B. Migdalia Dk PT, DPT Acute Rehabilitation  [phone] Pager [phone]     [place] 08/07/2016, 5:22 PM

## 2016-08-07 NOTE — Clinical Social Work Note (Signed)
Clinical Social Work Assessment  Patient Details  Name: RHEALYNN MYHRE MRN: [id number] Date of Birth: 1956-01-27  Date of referral:  08/07/16               Reason for consult:  Facility Placement                Permission sought to share information with:  Facility Art therapist granted to share information::  Yes, Verbal Permission Granted  Name::     Medical illustrator::  SNF  Relationship::  daughter  Contact Information:     Housing/Transportation Living arrangements for the past 2 months:  [place] of Information:  Patient, Adult Children Patient Interpreter Needed:    Criminal Activity/Legal Involvement Pertinent to Current Situation/Hospitalization:  No - Comment as needed Significant Relationships:  Adult Children, Other Family Members Lives with:  Other (Comment) (two sisters) Do you feel safe going back to the place where you live?  No Need for family participation in patient care:  Yes (Comment)  Care giving concerns:  Patient resides with sisters at home. Patient was independent prior to hospitalization. Patient unsafe to return home at this time.  Social Worker assessment / plan:  CSW met with patient and daughter at bedside to discuss recommendations for SNF placement at DC.  CSW explained CSW role and SNF options/placement. CSW obtained permission to send out offers to [facility]. Patient indicated that if she does not get accepted to [facility] she will not go anywhere else and go home. CSW obtained permission to send offer to [facility]. CSW will f/u.   FL2 and passr completed. Offers sent.  Employment status:  Retired Forensic scientist:  Other (Comment Required) Conservator, museum/gallery) PT Recommendations:  [place] / Referral to community resources:  [place]  Patient/Family's Response to care:  Patient appreciative of assistance for SNF placement. No issues or concerns  at this time.  Patient/Family's Understanding of and Emotional Response to Diagnosis, Current Treatment, and Prognosis:  Patient/family has good understanding of diagnosis, current treatment and prognosis. Hopeful that rehab will address impairment. No issues identified at this time.  Emotional Assessment Appearance:  Appears stated age (Cooperative) Attitude/Demeanor/Rapport:   (Cooperative) Affect (typically observed):  Accepting, Appropriate Orientation:  Oriented to Self, Oriented to Place, Oriented to  Time, Oriented to Situation Alcohol / Substance use:  Not Applicable Psych involvement (Current and /or in the community):  No (Comment)  Discharge Needs  Concerns to be addressed:  Care Coordination Readmission within the last 30 days:  No Current discharge risk:  Dependent with Mobility, Physical Impairment Barriers to Discharge:  No Barriers Identified   Normajean Baxter, LCSW 08/07/2016, 1:07 PM

## 2016-08-07 NOTE — NC FL2 (Signed)
[place] LEVEL OF CARE SCREENING TOOL     IDENTIFICATION  Patient Name: Monique Gonzalez Birthdate: 06/03/1955 Sex: female Admission Date (Current Location): 08/05/2016  [facility] and [place] Number:  Herbalist and Address:  [facility], [place] [address], [place], [place] [address]      Provider Number: [id number]  Attending Physician Name and Address:  Mcarthur Rossetti  Relative Name and Phone Number:  Judson Roch    Current Level of Care: Hospital Recommended Level of Care: [place] Prior Approval Number:    Date Approved/Denied: 08/07/16 PASRR Number: [id number]  Discharge Plan: SNF    Current Diagnoses: Patient Active Problem List   Diagnosis Date Noted  . Status post total right knee replacement 08/05/2016  . Class 2 obesity without serious comorbidity with body mass index (BMI) of 35.0 to 35.9 in adult 07/23/2016  . Unilateral primary osteoarthritis, right knee 05/20/2016  . Obesity (BMI 35.0-39.9 without comorbidity) 05/06/2016  . Class 2 obesity without serious comorbidity with body mass index (BMI) of 36.0 to 36.9 in adult 04/22/2016  . Class 2 obesity without serious comorbidity with body mass index (BMI) of 37.0 to 37.9 in adult 04/08/2016  . Vitamin D deficiency 04/08/2016  . Shortness of breath 03/25/2016  . Fracture, radius 08/08/2014  . Constipation 09/20/2013  . Syncope and collapse 11/30/2012  . Recurrent cold sores   . Right knee pain 11/23/2011  . Osteoarthritis   . Depression   . Hyperlipidemia     Orientation RESPIRATION BLADDER Height & Weight     Self, Time, Situation, Place  Normal Continent Weight: 263 lb (119.3 kg) Height:  6' (182.9 cm)  BEHAVIORAL SYMPTOMS/MOOD NEUROLOGICAL BOWEL NUTRITION STATUS      Continent Diet (See Dc Summary)  AMBULATORY STATUS COMMUNICATION OF NEEDS Skin   Limited Assist Verbally Surgical wounds (Closed Right Knee Incision with silver hydrofiber  wrap; Right arm)                       Personal Care Assistance Level of Assistance  Bathing, Feeding, Dressing Bathing Assistance: Limited assistance Feeding assistance: Independent Dressing Assistance: Limited assistance     Functional Limitations Info  Sight, Hearing, Speech Sight Info: Adequate Hearing Info: Adequate Speech Info: Adequate    SPECIAL CARE FACTORS FREQUENCY  PT (By licensed PT), OT (By licensed OT)     PT Frequency: 5xweek OT Frequency: 5xweek            Contractures      Additional Factors Info  Code Status, Allergies, Psychotropic Code Status Info: Full Allergies Info: PENICILLINS, SULFA ANTIBIOTICS  Psychotropic Info: Celexa         Current Medications (08/07/2016):  This is the current hospital active medication list Current Facility-Administered Medications  Medication Dose Route Frequency Provider Last Rate Last Dose  . 0.9 %  sodium chloride infusion   Intravenous Continuous Mcarthur Rossetti, MD 75 mL/hr at 08/05/16 2013    . acetaminophen (TYLENOL) tablet 650 mg  650 mg Oral Q6H PRN Mcarthur Rossetti, MD   650 mg at 08/07/16 0445   Or  . acetaminophen (TYLENOL) suppository 650 mg  650 mg Rectal Q6H PRN Mcarthur Rossetti, MD      . alum & mag hydroxide-simeth (MAALOX/MYLANTA) 200-200-20 MG/5ML suspension 30 mL  30 mL Oral Q4H PRN Mcarthur Rossetti, MD      . aspirin EC tablet 325 mg  325  mg Oral BID PC Mcarthur Rossetti, MD   325 mg at 08/07/16 1005  . atorvastatin (LIPITOR) tablet 40 mg  40 mg Oral QHS Mcarthur Rossetti, MD   40 mg at 08/06/16 2137  . buPROPion (WELLBUTRIN SR) 12 hr tablet 200 mg  200 mg Oral Daily Mcarthur Rossetti, MD   200 mg at 08/07/16 1005  . citalopram (CELEXA) tablet 20 mg  20 mg Oral QHS Mcarthur Rossetti, MD   20 mg at 08/06/16 2137  . diphenhydrAMINE (BENADRYL) 12.5 MG/5ML elixir 12.5-25 mg  12.5-25 mg Oral Q4H PRN Mcarthur Rossetti, MD      . docusate  sodium (COLACE) capsule 100 mg  100 mg Oral BID Mcarthur Rossetti, MD   100 mg at 08/07/16 1005  . fluticasone (FLONASE) 50 MCG/ACT nasal spray 2 spray  2 spray Each Nare QHS Mcarthur Rossetti, MD   2 spray at 08/06/16 2137  . menthol-cetylpyridinium (CEPACOL) lozenge 3 mg  1 lozenge Oral PRN Mcarthur Rossetti, MD       Or  . phenol (CHLORASEPTIC) mouth spray 1 spray  1 spray Mouth/Throat PRN Mcarthur Rossetti, MD      . methocarbamol (ROBAXIN) tablet 500 mg  500 mg Oral Q6H PRN Mcarthur Rossetti, MD   500 mg at 08/07/16 1004   Or  . methocarbamol (ROBAXIN) 500 mg in dextrose 5 % 50 mL IVPB  500 mg Intravenous Q6H PRN Mcarthur Rossetti, MD      . metoCLOPramide (REGLAN) tablet 5-10 mg  5-10 mg Oral Q8H PRN Mcarthur Rossetti, MD       Or  . metoCLOPramide (REGLAN) injection 5-10 mg  5-10 mg Intravenous Q8H PRN Mcarthur Rossetti, MD      . ondansetron [facility]) tablet 4 mg  4 mg Oral Q6H PRN Mcarthur Rossetti, MD       Or  . ondansetron [facility]) injection 4 mg  4 mg Intravenous Q6H PRN Mcarthur Rossetti, MD   4 mg at 08/06/16 1225  . oxyCODONE (Oxy IR/ROXICODONE) immediate release tablet 5-10 mg  5-10 mg Oral Q3H PRN Mcarthur Rossetti, MD   10 mg at 08/07/16 1436  . pantoprazole (PROTONIX) EC tablet 40 mg  40 mg Oral QHS Mcarthur Rossetti, MD   40 mg at 08/06/16 2137  . polyethylene glycol (MIRALAX / GLYCOLAX) packet 17 g  17 g Oral Daily PRN Mcarthur Rossetti, MD         Discharge Medications: Please see discharge summary for a list of discharge medications.  Relevant Imaging Results:  Relevant Lab Results:   Additional Information [id number]  Normajean Baxter, LCSW

## 2016-08-07 NOTE — Progress Notes (Signed)
Occupational Therapy Treatment Patient Details Name: Monique Gonzalez MRN: [id number] DOB: Nov 25, 1955 Today's Date: 08/07/2016    History of present illness Pt is a 61 yo female with c/o R knee pain dx with end stage R knee OA, s/p R TKA 08/05/16 PMH significant for syncope, OA, and osteopenia.    OT comments  Pt able to perform functional mobility in room with min guard assist today; no c/o dizziness, BP 124/64 in sitting. Educated pt on shower transfer technique and LB dressing technique; pt able to return demo both with min guard assist. D/c plan remains appropriate. Will continue to follow acutely.   Follow Up Recommendations  No OT follow up;Supervision/Assistance - 24 hour    Equipment Recommendations  None recommended by OT    Recommendations for Other Services      Precautions / Restrictions Precautions Precautions: Fall;Knee Precaution Booklet Issued: No Precaution Comments: BP stable this session, watch orthostatics Restrictions Weight Bearing Restrictions: Yes RLE Weight Bearing: Weight bearing as tolerated       Mobility Bed Mobility Overal bed mobility: Modified Independent             General bed mobility comments: HOB elevated with use of bed rails  Transfers Overall transfer level: Needs assistance Equipment used: Rolling walker (2 wheeled) Transfers: Sit to/from Stand Sit to Stand: Min guard         General transfer comment: Min guard for safety. Good hand placement and technique    Balance Overall balance assessment: Needs assistance Sitting-balance support: Feet supported;No upper extremity supported Sitting balance-Leahy Scale: Good     Standing balance support: Bilateral upper extremity supported Standing balance-Leahy Scale: Poor Standing balance comment: RW for support                           ADL either performed or assessed with clinical judgement   ADL Overall ADL's : Needs assistance/impaired                      Lower Body Dressing: Min guard;Sit to/from stand Lower Body Dressing Details (indicate cue type and reason): Reviewed LB dressing technique, pt able to reach toes  Toilet Transfer: Min guard;Ambulation;RW Toilet Transfer Details (indicate cue type and reason): simulated by sit to stand from EOB with functional mobility in room     Tub/ Shower Transfer: Min guard;Walk-in shower;Ambulation;Rolling walker Tub/Shower Transfer Details (indicate cue type and reason): Educated pt on walk in shower transfer technique; pt able to return demo simulation in room with min guard assist. Discussed supervision for safety with shower transfer upon return home and use of built in bench for fall prevention and safety  Functional mobility during ADLs: Min guard;Rolling walker General ADL Comments: BP 124/64 in sitting; no c/o dizziness throughout     Vision       Perception     Praxis      Cognition Arousal/Alertness: Awake/alert Behavior During Therapy: WFL for tasks assessed/performed Overall Cognitive Status: Within Functional Limits for tasks assessed                                          Exercises     Shoulder Instructions       General Comments CPM donned at end of session 0-60 degrees    Pertinent Vitals/ Pain  Pain Assessment: Faces Faces Pain Scale: Hurts even more Pain Location: R knee Pain Descriptors / Indicators: Sore Pain Intervention(s): Monitored during session;Ice applied  Home Living                                          Prior Functioning/Environment              Frequency  Min 2X/week        Progress Toward Goals  OT Goals(current goals can now be found in the care plan section)  Progress towards OT goals: Progressing toward goals  Acute Rehab OT Goals Patient Stated Goal: Go home  OT Goal Formulation: With patient  Plan Discharge plan remains appropriate    Co-evaluation                  AM-PAC PT "6 Clicks" Daily Activity     Outcome Measure   Help from another person eating meals?: None Help from another person taking care of personal grooming?: A Little Help from another person toileting, which includes using toliet, bedpan, or urinal?: A Little Help from another person bathing (including washing, rinsing, drying)?: A Little Help from another person to put on and taking off regular upper body clothing?: None Help from another person to put on and taking off regular lower body clothing?: A Little 6 Click Score: 20    End of Session Equipment Utilized During Treatment: Rolling walker CPM Right Knee CPM Right Knee: On Right Knee Flexion (Degrees): 60 Right Knee Extension (Degrees): 0 Additional Comments: placed pt on cpm at 0-60 degrees on pt right leg/knee.  pt tolerated well. right knee.  OT Visit Diagnosis: Unsteadiness on feet (R26.81);Other abnormalities of gait and mobility (R26.89);Muscle weakness (generalized) (M62.81);Pain Pain - Right/Left: Right Pain - part of body: Knee   Activity Tolerance Patient tolerated treatment well   Patient Left in bed;with call bell/phone within reach   Nurse Communication Mobility status;Other (comment) (BP)        Time: 0923-3007 OT Time Calculation (min): 18 min  Charges: OT General Charges $OT Visit: 1 Procedure OT Treatments $Self Care/Home Management : 8-22 mins  Cyara Devoto A. Ulice Brilliant, M.S., OTR/L Pager: [place] 08/07/2016, 1:58 PM

## 2016-08-07 NOTE — Social Work (Addendum)
CSW contacted facililty to start auth today. Auth pending.  Patient choses bed at [facility].

## 2016-08-07 NOTE — Progress Notes (Signed)
Physical Therapy Treatment Patient Details Name: Monique Gonzalez MRN: [id number] DOB: 1955/06/04 Today's Date: 08/07/2016    History of Present Illness Pt is a 61 yo female with c/o R knee pain dx with end stage R knee OA, s/p R TKA 08/05/16 PMH significant for syncope, OA, and osteopenia.     PT Comments    Pt continues to be limited in her therapy sessions by syncope with mobilization. Pt perform supine exercises and was minA for supine to sit EoB for LE management to floor. Pt BP 99/50 EoB after 3 minutes BP dropped to 82/45 pt dizzy but able to perform sit to supine with min guard. Pt requires skilled PT to progress ambulation and improve LE strength, ROM and endurance to safely navigate in her home environment at discharge.      Follow Up Recommendations  DC plan and follow up therapy as arranged by surgeon           Precautions / Restrictions Precautions Precautions: Fall;Knee Precaution Booklet Issued: No Precaution Comments: Orthostatic this session  Restrictions Weight Bearing Restrictions: Yes RLE Weight Bearing: Weight bearing as tolerated    Mobility  Bed Mobility Overal bed mobility: Needs Assistance Bed Mobility: Supine to Sit;Sit to Supine     Supine to sit: Min assist Sit to supine: Min guard   General bed mobility comments: HOB elevated with use of bed rails               Balance Overall balance assessment: Needs assistance Sitting-balance support: Feet supported;No upper extremity supported Sitting balance-Leahy Scale: Good                                Cognition Arousal/Alertness: Awake/alert Behavior During Therapy: WFL for tasks assessed/performed Overall Cognitive Status: Within Functional Limits for tasks assessed                                        Exercises Total Joint Exercises Ankle Circles/Pumps: AROM;Both;10 reps;Supine Quad Sets: AROM;Right;10 reps;Supine (ankle supported on pillows) Heel Slides:  AROM;Right;10 reps;Supine    General Comments General comments (skin integrity, edema, etc.): Pt limited in session by orthostasis BP taken initially in sitting EoB 99/50 with 3 minutes of sitting BP retaken and dropped to 82/45. Pt complaints of dizziness and visible palor, Pt laid back in bed and BP measured again at 118/57.      Pertinent Vitals/Pain Pain Assessment: 0-10 Pain Score: 7  Faces Pain Scale: Hurts even more Pain Location: R knee Pain Descriptors / Indicators: Sore;Constant;Grimacing;Guarding Pain Intervention(s): Monitored during session;Limited activity within patient's tolerance;Ice applied  See general comments           PT Goals (current goals can now be found in the care plan section) Acute Rehab PT Goals Patient Stated Goal: Go home  PT Goal Formulation: With patient Time For Goal Achievement: 08/20/16 Potential to Achieve Goals: Good Progress towards PT goals: PT to reassess next treatment    Frequency    7X/week      PT Plan Current plan remains appropriate       AM-PAC PT "6 Clicks" Daily Activity  Outcome Measure  Difficulty turning over in bed (including adjusting bedclothes, sheets and blankets)?: A Little Difficulty moving from lying on back to sitting on the side of the bed? : A Little Difficulty  sitting down on and standing up from a chair with arms (e.g., wheelchair, bedside commode, etc,.)?: A Lot Help needed moving to and from a bed to chair (including a wheelchair)?: A Little Help needed walking in hospital room?: A Lot Help needed climbing 3-5 steps with a railing? : A Lot 6 Click Score: 15    End of Session Equipment Utilized During Treatment: Gait belt;Right knee immobilizer Activity Tolerance:  (Pt limited by orthostasis) Patient left: in bed;with call bell/phone within reach;with family/visitor present Nurse Communication: Mobility status;Other (comment) (drop in BP and inability to mobilize) PT Visit Diagnosis:  Unsteadiness on feet (R26.81);Other abnormalities of gait and mobility (R26.89);Muscle weakness (generalized) (M62.81);Pain;Dizziness and giddiness (R42) Pain - Right/Left: Right Pain - part of body: Knee     Time: 1015-1040 PT Time Calculation (min) (ACUTE ONLY): 25 min  Charges:  $Therapeutic Exercise: 8-22 mins $Therapeutic Activity: 8-22 mins                    G Codes:       Luigi Stuckey B. Migdalia Dk PT, DPT Acute Rehabilitation  [phone] Pager [phone]     [place] 08/07/2016, 2:16 PM

## 2016-08-08 MED ORDER — OXYCODONE-ACETAMINOPHEN 5-325 MG PO TABS: 1.0000 | ORAL_TABLET | ORAL | 0 refills | Status: DC | PRN

## 2016-08-08 MED ORDER — CYCLOBENZAPRINE HCL 10 MG PO TABS: 10.0000 mg | ORAL_TABLET | Freq: Three times a day (TID) | ORAL | 0 refills | Status: DC | PRN

## 2016-08-08 MED ORDER — ASPIRIN 325 MG PO TBEC: 325.0000 mg | DELAYED_RELEASE_TABLET | Freq: Two times a day (BID) | ORAL | 0 refills | Status: DC

## 2016-08-08 NOTE — Progress Notes (Signed)
Physical Therapy Treatment Patient Details Name: Monique Gonzalez MRN: [id number] DOB: 1955-09-13 Today's Date: 08/08/2016    History of Present Illness Pt is a 61 yo female with c/o R knee pain dx with end stage R knee OA, s/p R TKA 08/05/16 PMH significant for syncope, OA, and osteopenia.     PT Comments    Pt continues to be limited by pain and blood pressure drops. Pt min A for bed mobility, minA for transfers and min guard for ambulation of 20 feet with RW. Pt is very diligent about performing therex between PT treatment sessions to keep her strength and ROM even though she is not able to ambulate as far as she would like to. Pt requires skilled PT to progress her ambulation and stair training and to continue to improve strength, ROM and endurance to be able to safely navigate in her discharge environment.    Follow Up Recommendations  SNF     Equipment Recommendations  None recommended by PT    Recommendations for Other Services       Precautions / Restrictions Precautions Precautions: Fall;Knee Precaution Booklet Issued: No Restrictions Weight Bearing Restrictions: Yes RLE Weight Bearing: Weight bearing as tolerated    Mobility  Bed Mobility Overal bed mobility: Needs Assistance Bed Mobility: Supine to Sit;Sit to Supine     Supine to sit: Min assist     General bed mobility comments: minA for managing R LE to floorHOB elevated with use of bed rails  Transfers Overall transfer level: Needs assistance Equipment used: Rolling walker (2 wheeled) Transfers: Sit to/from Stand Sit to Stand: Min guard;Min assist         General transfer comment: Min guard for safety with sit>stand from bed, and sit<>stand from toilet, minAx1 for stand>sit after becoming dizzy with ambulation, vc for good hand placement and technique  Ambulation/Gait Ambulation/Gait assistance: Min guard Ambulation Distance (Feet): 20 Feet Assistive device: Rolling walker (2 wheeled) Gait  Pattern/deviations: Step-to pattern;Decreased step length - left;Decreased stance time - right;Decreased dorsiflexion - right;Antalgic;Shuffle;Trunk flexed (circumduction ) Gait velocity: slowed Gait velocity interpretation: Below normal speed for age/gender General Gait Details: min guard for safety, pt able to increase heel strike and knee flexion with gait. vc for looking up and out. Pt requested standing rest break and then requested sitting down at 20 feet ambulation for dizziness       Balance Overall balance assessment: Needs assistance Sitting-balance support: Feet supported;No upper extremity supported Sitting balance-Leahy Scale: Good     Standing balance support: Bilateral upper extremity supported;During functional activity;Single extremity supported Standing balance-Leahy Scale: Fair Standing balance comment: able to support with single upper extremity to brush teeth                            Cognition Arousal/Alertness: Awake/alert Behavior During Therapy: WFL for tasks assessed/performed Overall Cognitive Status: Within Functional Limits for tasks assessed                                           General Comments General comments (skin integrity, edema, etc.): Pt felt increase in pain with ambulation and reported feeling dizzy Pt sat in recliner and BP at that time 111/51. Pt wheeled back to room and set up sitting in chair. Pain decreased and dizziness diminished      Pertinent Vitals/Pain Pain  Assessment: 0-10 Pain Score: 7  Pain Location: R knee Pain Descriptors / Indicators: Sore;Constant;Grimacing;Guarding;Aching Pain Intervention(s): Monitored during session;Repositioned;Ice applied  See general comments            PT Goals (current goals can now be found in the care plan section) Acute Rehab PT Goals Patient Stated Goal: Go home  PT Goal Formulation: With patient Time For Goal Achievement: 08/20/16 Potential to Achieve  Goals: Fair    Frequency    7X/week      PT Plan Discharge plan needs to be updated       AM-PAC PT "6 Clicks" Daily Activity  Outcome Measure  Difficulty turning over in bed (including adjusting bedclothes, sheets and blankets)?: A Little Difficulty moving from lying on back to sitting on the side of the bed? : Total Difficulty sitting down on and standing up from a chair with arms (e.g., wheelchair, bedside commode, etc,.)?: Total Help needed moving to and from a bed to chair (including a wheelchair)?: A Little Help needed walking in hospital room?: A Lot Help needed climbing 3-5 steps with a railing? : A Lot 6 Click Score: 12    End of Session Equipment Utilized During Treatment: Gait belt Activity Tolerance: Patient tolerated treatment well Patient left: with call bell/phone within reach;with family/visitor present;in chair Nurse Communication: Mobility status;Other (comment) (BP drop with ambulation) PT Visit Diagnosis: Unsteadiness on feet (R26.81);Other abnormalities of gait and mobility (R26.89);Muscle weakness (generalized) (M62.81);Pain;Dizziness and giddiness (R42) Pain - Right/Left: Right Pain - part of body: Knee     Time: 1840-3754 PT Time Calculation (min) (ACUTE ONLY): 25 min  Charges:  $Gait Training: 8-22 mins $Therapeutic Activity: 8-22 mins                    G Codes:       Monique Gonzalez B. Migdalia Dk PT, DPT Acute Rehabilitation  [phone] Pager [phone]     [place] 08/08/2016, 9:59 AM

## 2016-08-08 NOTE — Progress Notes (Signed)
[name], female   DOB: [birth year], 61 y.o.   MRN: [id number] No syncopal episodes last evening.  Vitals stable and right knee stable.  I think skilled nursing short-term would be good for her.  Can discharge to SNF today.

## 2016-08-08 NOTE — Discharge Instructions (Signed)

## 2016-08-08 NOTE — Consult Note (Signed)
   [facility] CM Inpatient Consult   08/08/2016  Monique Gonzalez 04-07-1955 [id number]    [facility] Care Management/ Link to Wellness follow up for [employer] employees/dependents with [facility] insurance.   Chart reviewed. Noted discharge plan is for SNF. Spoke with inpatient LCSW who indicates she is awaiting authorization from [facility] for SNF placement to [facility].  Will follow up on above.    Marthenia Rolling, MSN-Ed, RN,BSN [facility] Liaison [phone]

## 2016-08-08 NOTE — Discharge Summary (Signed)
[name] MRN: [id number] DOB/AGE: 1955-06-03 61 y.o.  Admit date: 08/05/2016 Discharge date: 08/08/2016  Admission Diagnoses:  Principal Problem:   Unilateral primary osteoarthritis, right knee Active Problems:   Status post total right knee replacement   Discharge Diagnoses:  Same  Past Medical History:  Diagnosis Date  . Allergy   . Chronic pain    back and knee  . Colitis 09/2012   infectious vs inflammatory.   . Colon stricture ([place])   . Depression   . Hyperlipidemia   . Obesity   . Osteoarthritis   . Osteopenia   . Recurrent cold sores   . Seasonal allergies   . Syncope and collapse 11/30/2012   Normal EEG.  Vaso vagal syncope    Surgeries: Procedure(s): RIGHT TOTAL KNEE ARTHROPLASTY on 08/05/2016   Consultants:   Discharged Condition: Improved  Hospital Course: Monique Gonzalez is an 61 y.o. female who was admitted 08/05/2016 for operative treatment ofUnilateral primary osteoarthritis, right knee. Patient has severe unremitting pain that affects sleep, daily activities, and work/hobbies. After pre-op clearance the patient was taken to the operating room on 08/05/2016 and underwent  Procedure(s): RIGHT TOTAL KNEE ARTHROPLASTY.    Patient was given perioperative antibiotics: Anti-infectives    Start     Dose/Rate Route Frequency Ordered Stop   08/05/16 2100  clindamycin (CLEOCIN) IVPB 600 mg     600 mg 100 mL/hr over 30 Minutes Intravenous Every 6 hours 08/05/16 2021 08/06/16 0339   08/05/16 1130  clindamycin (CLEOCIN) IVPB 900 mg     900 mg 100 mL/hr over 30 Minutes Intravenous To [facility] 08/03/16 1403 08/05/16 1255       Patient was given sequential compression devices, early ambulation, and chemoprophylaxis to prevent DVT.  Patient benefited maximally from hospital stay and there were no complications.    Recent vital signs: Patient Vitals for the past 24 hrs:  BP Temp Temp src Pulse Resp SpO2  08/08/16 0423 (!) 120/52 98.6 F  (37 C) Oral 74 18 100 %  08/07/16 2042 135/61 98.7 F (37.1 C) Oral 84 18 100 %  08/07/16 1300 (!) 131/49 98.9 F (37.2 C) Oral 81 20 97 %  08/07/16 1000 (!) 108/48 - - - - -  08/07/16 0657 (!) 126/45 100 F (37.8 C) Oral 76 20 97 %     Recent laboratory studies:  Recent Labs  08/06/16 0537 08/06/16 0641  WBC 7.3 9.9  HGB 9.9* 10.0*  HCT 29.7* 31.3*  PLT 174 189  NA 136  --   K 4.6  --   CL 107  --   CO2 25  --   BUN 20  --   CREATININE 0.87  --   GLUCOSE 157*  --   CALCIUM 8.4*  --      Discharge Medications:   Allergies as of 08/08/2016      Reactions   Penicillins Rash, Other (See Comments)   Has patient had a PCN reaction causing immediate rash, facial/tongue/throat swelling, SOB or lightheadedness with hypotension: Unknown Has patient had a PCN reaction causing severe rash involving mucus membranes or skin necrosis: Unknown Has patient had a PCN reaction that required hospitalization: No Has patient had a PCN reaction occurring within the last 10 years: No If all of the above answers are "NO", then may proceed with Cephalosporin use. Childhood reaction.   Sulfa Antibiotics Rash      Medication List    STOP taking these medications  meloxicam 15 MG tablet Commonly known as:  MOBIC     TAKE these medications   acetaminophen 650 MG CR tablet Commonly known as:  TYLENOL Take 1,300 mg by mouth at bedtime.   acyclovir cream 5 % Commonly known as:  ZOVIRAX Apply 1 application topically every 3 (three) hours. What changed:  when to take this  reasons to take this   aspirin 325 MG EC tablet Take 1 tablet (325 mg total) by mouth 2 (two) times daily after a meal.   atorvastatin 40 MG tablet Commonly known as:  LIPITOR Take 1 tablet (40 mg total) by mouth daily. What changed:  when to take this   buPROPion 200 MG 12 hr tablet Commonly known as:  WELLBUTRIN SR Take 1 tablet (200 mg total) by mouth daily.   cetirizine 10 MG tablet Commonly known as:   ZYRTEC Take 10 mg by mouth at bedtime.   citalopram 20 MG tablet Commonly known as:  CELEXA Take 1 tablet (20 mg total) by mouth daily. What changed:  when to take this   cyclobenzaprine 10 MG tablet Commonly known as:  FLEXERIL Take 1 tablet (10 mg total) by mouth 3 (three) times daily as needed for muscle spasms.   fluticasone 50 MCG/ACT nasal spray Commonly known as:  FLONASE Place 2 sprays into both nostrils daily. What changed:  when to take this   loratadine 10 MG tablet Commonly known as:  CLARITIN Take 10 mg by mouth at bedtime.   multivitamin,tx-minerals tablet Take 1 tablet by mouth daily. POWDER MIX   oxyCODONE-acetaminophen 5-325 MG tablet Commonly known as:  ROXICET Take 1-2 tablets by mouth every 4 (four) hours as needed.   valACYclovir 1000 MG tablet Commonly known as:  VALTREX TAKE 2 TABLETS BY MOUTH EVERY 12 HOURS FOR 2 DOSES AS NEEDED FOR FLARE What changed:  how much to take  how to take this  when to take this  reasons to take this  additional instructions   Vitamin D (Ergocalciferol) 50000 units Caps capsule Commonly known as:  DRISDOL Take 1 capsule (50,000 Units total) by mouth every 7 (seven) days. What changed:  when to take this  additional instructions            Durable Medical Equipment        Start     Ordered   08/05/16 2022  DME Walker rolling  Once    Question:  Patient needs a walker to treat with the following condition  Answer:  Status post total right knee replacement   08/05/16 2021   08/05/16 2022  DME 3 n 1  Once     08/05/16 2021      Diagnostic Studies: Dg Knee Right Port  Result Date: 08/05/2016 CLINICAL DATA:  Status post right total knee joint prosthesis placement. EXAM: PORTABLE RIGHT KNEE - 1-2 VIEW COMPARISON:  Right knee series of June 06, 2016 FINDINGS: Radiographic positioning of the prosthetic components is good. The interface with the native bone appears normal. With the native bone itself  exhibits no suspicious findings. IMPRESSION: No immediate postprocedure complication following right total knee joint prosthesis placement. Electronically Signed   By: David  Martinique M.D.   On: 08/05/2016 15:19    Disposition: to skilled nursing facility  Discharge Instructions    Discharge patient    Complete by:  As directed    Discharge disposition:  03-Skilled [place]   Discharge patient date:  08/08/2016  Contact information for follow-up providers    [facility].   Specialty:  [place] Why:  A representative from [facility] will contact you to arrange start date and time for your therapy. Contact information: [address] [place] [address] [phone]        Mcarthur Rossetti, MD Follow up in 2 week(s).   Specialty:  Orthopedic Surgery Contact information: [place] [address] [phone]            Contact information for after-discharge care    Destination    [facility] Follow up.   Specialty:  Skilled Nursing Facility Contact information: [place] [phone]                   Signed: Mcarthur Rossetti 08/08/2016, 6:53 AM

## 2016-08-09 NOTE — Progress Notes (Signed)
Subjective: Patient stable.  Tolerating therapy well   Objective: Vital signs in last 24 hours: Temp:  [98 F (36.7 C)-99.5 F (37.5 C)] 98 F (36.7 C) (06/02 0404) Pulse Rate:  [71-82] 71 (06/02 0404) Resp:  [18] 18 (06/02 0404) BP: (123-147)/(53-61) 123/53 (06/02 0404) SpO2:  [99 %-100 %] 100 % (06/02 0404)  Intake/Output from previous day: 06/01 0701 - 06/02 0700 In: 600 [P.O.:600] Out: -  Intake/Output this shift: No intake/output data recorded.  Exam:  Dorsiflexion/Plantar flexion intact  Labs: No results for input(s): HGB in the last 72 hours. No results for input(s): WBC, RBC, HCT, PLT in the last 72 hours. No results for input(s): NA, K, CL, CO2, BUN, CREATININE, GLUCOSE, CALCIUM in the last 72 hours. No results for input(s): LABPT, INR in the last 72 hours.  Assessment/Plan: Plan discharge to skilled nursing when that is available.   Landry Dyke [place] 08/09/2016, 9:25 AM

## 2016-08-09 NOTE — Progress Notes (Signed)
Physical Therapy Treatment Patient Details Name: Monique Gonzalez MRN: [id number] DOB: September 25, 1955 Today's Date: 08/09/2016    History of Present Illness Pt is a 61 yo female with c/o R knee pain dx with end stage R knee OA, s/p R TKA 08/05/16 PMH significant for syncope, OA, and osteopenia.     PT Comments    Patient making steady progress towards PT goals. Ambulate din hall, tolerated some there ex and is positioned in CPM at this time. Will continue current POC. SNF remains appropriate.   Follow Up Recommendations  SNF     Equipment Recommendations  None recommended by PT    Recommendations for Other Services       Precautions / Restrictions Precautions Precautions: Fall;Knee Precaution Booklet Issued: No Restrictions Weight Bearing Restrictions: Yes RLE Weight Bearing: Weight bearing as tolerated    Mobility  Bed Mobility Overal bed mobility: Needs Assistance Bed Mobility: Supine to Sit;Sit to Supine     Supine to sit: Min assist     General bed mobility comments: increased time to perform, min assist for control of RLE to EOB and to elevate to return to bed  Transfers Overall transfer level: Needs assistance Equipment used: Rolling walker (2 wheeled) Transfers: Sit to/from Stand Sit to Stand: Min guard         General transfer comment: min guard x3 during session, increased effort noted, elevated bed height required  Ambulation/Gait Ambulation/Gait assistance: Min guard Ambulation Distance (Feet): 110 Feet Assistive device: Rolling walker (2 wheeled) Gait Pattern/deviations: Step-to pattern;Decreased step length - left;Decreased stance time - right;Decreased dorsiflexion - right;Antalgic;Shuffle;Trunk flexed (circumduction ) Gait velocity: decreased Gait velocity interpretation: Below normal speed for age/gender General Gait Details: 3 standing rest breaks, min guard for safety, multi modal cues for technique and positioning, fatigues easily    Stairs            Wheelchair Mobility    Modified Rankin (Stroke Patients Only)       Balance Overall balance assessment: Needs assistance Sitting-balance support: Feet supported;No upper extremity supported Sitting balance-Leahy Scale: Good     Standing balance support: Bilateral upper extremity supported;During functional activity;Single extremity supported Standing balance-Leahy Scale: Fair Standing balance comment: able to stand without support breifly, unable to accept challange at this time                            Cognition Arousal/Alertness: Awake/alert Behavior During Therapy: WFL for tasks assessed/performed Overall Cognitive Status: Within Functional Limits for tasks assessed                                        Exercises Total Joint Exercises Straight Leg Raises: AAROM;10 reps;Right Goniometric ROM: 5-80 (limited by edema)    General Comments General comments (skin integrity, edema, etc.): VCs for technique of AAROM exercises      Pertinent Vitals/Pain Pain Assessment: 0-10 Pain Score: 6  Pain Location: R knee Pain Descriptors / Indicators: Sore;Constant;Grimacing;Guarding;Aching Pain Intervention(s): Monitored during session    Home Living                      Prior Function            PT Goals (current goals can now be found in the care plan section) Acute Rehab PT Goals Patient Stated Goal: Go home  PT  Goal Formulation: With patient Time For Goal Achievement: 08/20/16 Potential to Achieve Goals: Fair Progress towards PT goals: Progressing toward goals    Frequency    7X/week      PT Plan Discharge plan needs to be updated    Co-evaluation              AM-PAC PT "6 Clicks" Daily Activity  Outcome Measure  Difficulty turning over in bed (including adjusting bedclothes, sheets and blankets)?: A Little Difficulty moving from lying on back to sitting on the side of the bed? : Total Difficulty  sitting down on and standing up from a chair with arms (e.g., wheelchair, bedside commode, etc,.)?: Total Help needed moving to and from a bed to chair (including a wheelchair)?: A Little Help needed walking in hospital room?: A Lot Help needed climbing 3-5 steps with a railing? : A Lot 6 Click Score: 12    End of Session Equipment Utilized During Treatment: Gait belt Activity Tolerance: Patient tolerated treatment well Patient left: with call bell/phone within reach;with family/visitor present;in chair Nurse Communication: Mobility status;Other (comment) (BP drop with ambulation) PT Visit Diagnosis: Unsteadiness on feet (R26.81);Other abnormalities of gait and mobility (R26.89);Muscle weakness (generalized) (M62.81);Pain;Dizziness and giddiness (R42) Pain - Right/Left: Right Pain - part of body: Knee     Time: 1435-1501 PT Time Calculation (min) (ACUTE ONLY): 26 min  Charges:  $Gait Training: 8-22 mins $Therapeutic Activity: 8-22 mins                    G Codes:       Alben Deeds, PT DPT  [phone]    [place]can [place] 08/09/2016, 3:06 PM

## 2016-08-10 ENCOUNTER — Encounter (HOSPITAL_COMMUNITY): Payer: Self-pay | Admitting: *Deleted

## 2016-08-10 NOTE — Progress Notes (Signed)
Orthopedic Tech Progress Note Patient Details:  Monique Gonzalez Aug 05, 1955 [id number]  CPM Right Knee CPM Right Knee: On Right Knee Flexion (Degrees): 60 Right Knee Extension (Degrees): 0 Additional Comments: placed pt on cpm at 0-60 degrees on pt right leg/knee at [place] 08/10/2016, 3:03 PM

## 2016-08-10 NOTE — Progress Notes (Signed)
OT Cancellation Note  Patient Details Name: Monique Gonzalez MRN: [id number] DOB: [birth year]   Cancelled Treatment:    Reason Eval/Treat Not Completed: Other (comment) (Pt in session with PT.)   Benito Mccreedy OTR/L 08/10/2016, 10:24 AM

## 2016-08-10 NOTE — Progress Notes (Signed)
Physical Therapy Treatment Patient Details Name: Monique Gonzalez MRN: [id number] DOB: [birth year] Today's Date: 08/10/2016    History of Present Illness Pt is a 61 yo female with c/o R knee pain dx with end stage R knee OA, s/p R TKA 08/05/16 PMH significant for syncope, OA, and osteopenia.     PT Comments    Pt continues to experience orthostatic hypotension with ambulation. She is very motivated to participate in therapy. SNF remains appropriate.   Follow Up Recommendations  SNF     Equipment Recommendations  None recommended by PT    Recommendations for Other Services       Precautions / Restrictions Precautions Precautions: Fall;Knee;Other (comment) Precaution Comments: watch BP (orthostatic) Restrictions RLE Weight Bearing: Weight bearing as tolerated Other Position/Activity Restrictions: KI d/c'd    Mobility  Bed Mobility               General bed mobility comments: Pt received in recliner.  Transfers   Equipment used: Rolling walker (2 wheeled)   Sit to Stand: Min guard         General transfer comment: increased time to complete at this assist level  Ambulation/Gait Ambulation/Gait assistance: Min guard Ambulation Distance (Feet): 125 Feet Assistive device: Rolling walker (2 wheeled) Gait Pattern/deviations: Decreased stride length;Antalgic;Step-through pattern;Trunk flexed Gait velocity: decreased Gait velocity interpretation: Below normal speed for age/gender General Gait Details: Verbal cues for posture. Pt with c/o dizziness after 60 feet requiring seated rest break. BP taken, 100/53.  Pt able to continue with ambulation after 3 minute seated rest.   Stairs            Wheelchair Mobility    Modified Rankin (Stroke Patients Only)       Balance                                            Cognition Arousal/Alertness: Awake/alert Behavior During Therapy: WFL for tasks assessed/performed Overall Cognitive Status:  Within Functional Limits for tasks assessed                                        Exercises Total Joint Exercises Ankle Circles/Pumps: AROM;Both;10 reps Quad Sets: AROM;Right;10 reps Short Arc Quad: AROM;Right;10 reps Heel Slides: AAROM;Right;10 reps Hip ABduction/ADduction: AROM;Right;10 reps Straight Leg Raises: AAROM;Right;10 reps Knee Flexion: AROM;Right;10 reps Goniometric ROM: 0-85 R knee    General Comments        Pertinent Vitals/Pain Pain Assessment: 0-10 Pain Score: 4  Pain Location: R knee Pain Descriptors / Indicators: Sore Pain Intervention(s): Monitored during session;Ice applied    Home Living                      Prior Function            PT Goals (current goals can now be found in the care plan section) Acute Rehab PT Goals Patient Stated Goal: Go home  PT Goal Formulation: With patient Time For Goal Achievement: 08/20/16 Potential to Achieve Goals: Fair Progress towards PT goals: Progressing toward goals    Frequency    7X/week      PT Plan Current plan remains appropriate    Co-evaluation              AM-PAC PT "6  Clicks" Daily Activity  Outcome Measure  Difficulty turning over in bed (including adjusting bedclothes, sheets and blankets)?: A Little Difficulty moving from lying on back to sitting on the side of the bed? : Total Difficulty sitting down on and standing up from a chair with arms (e.g., wheelchair, bedside commode, etc,.)?: Total Help needed moving to and from a bed to chair (including a wheelchair)?: A Little Help needed walking in hospital room?: A Lot Help needed climbing 3-5 steps with a railing? : A Lot 6 Click Score: 12    End of Session Equipment Utilized During Treatment: Gait belt Activity Tolerance: Patient tolerated treatment well Patient left: in chair;with call bell/phone within reach;with family/visitor present Nurse Communication: Mobility status PT Visit Diagnosis:  Unsteadiness on feet (R26.81);Other abnormalities of gait and mobility (R26.89);Muscle weakness (generalized) (M62.81);Pain;Dizziness and giddiness (R42) Pain - Right/Left: Right Pain - part of body: Knee     Time: 1001-1036 PT Time Calculation (min) (ACUTE ONLY): 35 min  Charges:  $Gait Training: 8-22 mins $Therapeutic Exercise: 8-22 mins                    G Codes:       Lorrin Goodell, PT  Office # [phone] Pager [phone]    Monique Gonzalez 08/10/2016, 10:58 AM

## 2016-08-10 NOTE — Progress Notes (Signed)
Subjective: Patient doing well. Spending time in CPM machine  Objective: Vital signs in last 24 hours: Temp:  [98.2 F (36.8 C)-99.3 F (37.4 C)] 98.2 F (36.8 C) (06/03 0520) Pulse Rate:  [78-80] 78 (06/03 0520) Resp:  [18] 18 (06/03 0520) BP: (108-126)/(55-97) 126/55 (06/03 0520) SpO2:  [100 %] 100 % (06/03 0520)  Intake/Output from previous day: 06/02 0701 - 06/03 0700 In: 720 [P.O.:720] Out: -  Intake/Output this shift: No intake/output data recorded.  Exam:  Intact pulses distally Dorsiflexion/Plantar flexion intact  Labs: No results for input(s): HGB in the last 72 hours. No results for input(s): WBC, RBC, HCT, PLT in the last 72 hours. No results for input(s): NA, K, CL, CO2, BUN, CREATININE, GLUCOSE, CALCIUM in the last 72 hours. No results for input(s): LABPT, INR in the last 72 hours.  Assessment/Plan: Plan is skilled nursing tomorrow. She is making good progress   G Alphonzo Severance 08/10/2016, 7:39 AM

## 2016-08-11 DIAGNOSIS — G8911 Acute pain due to trauma: Secondary | ICD-10-CM | POA: Diagnosis not present

## 2016-08-11 DIAGNOSIS — Z96651 Presence of right artificial knee joint: Secondary | ICD-10-CM | POA: Diagnosis not present

## 2016-08-11 DIAGNOSIS — R278 Other lack of coordination: Secondary | ICD-10-CM | POA: Diagnosis not present

## 2016-08-11 DIAGNOSIS — M1711 Unilateral primary osteoarthritis, right knee: Secondary | ICD-10-CM | POA: Diagnosis not present

## 2016-08-11 DIAGNOSIS — G8929 Other chronic pain: Secondary | ICD-10-CM | POA: Diagnosis not present

## 2016-08-11 DIAGNOSIS — Z96659 Presence of unspecified artificial knee joint: Secondary | ICD-10-CM | POA: Diagnosis not present

## 2016-08-11 DIAGNOSIS — E785 Hyperlipidemia, unspecified: Secondary | ICD-10-CM | POA: Diagnosis not present

## 2016-08-11 DIAGNOSIS — Z471 Aftercare following joint replacement surgery: Secondary | ICD-10-CM | POA: Diagnosis not present

## 2016-08-11 DIAGNOSIS — S8990XA Unspecified injury of unspecified lower leg, initial encounter: Secondary | ICD-10-CM | POA: Diagnosis not present

## 2016-08-11 DIAGNOSIS — F339 Major depressive disorder, recurrent, unspecified: Secondary | ICD-10-CM | POA: Diagnosis not present

## 2016-08-11 DIAGNOSIS — R2689 Other abnormalities of gait and mobility: Secondary | ICD-10-CM | POA: Diagnosis not present

## 2016-08-11 NOTE — Care Management Note (Signed)
Case Management Note  Patient Details  Name: [name] MRN: [id number] Date of Birth: 08-09-1955  Subjective/Objective:  S/p Right TKA                   Action/Plan: Discharge Planning: Chart reviewed. Scheduled to dc to SNF, CSW following for SNF placement.   PCP Orlena Sheldon MD   Expected Discharge Date:  08/11/16               Expected Discharge Plan:  [place]  In-House Referral:  NA, Clinical Social Work  Discharge planning Services  CM Consult  Post Acute Care Choice:  Home Health Choice offered to:  Patient  DME Arranged:  N/A (has RW and 3in1) DME Agency:  NA  HH Arranged:  PT [place] Agency:  [facility] ([facility])  Status of Service:  Completed, signed off  If discussed at [employer], dates discussed:    Additional Comments:  Erenest Rasher, RN 08/11/2016, 11:22 AM

## 2016-08-11 NOTE — Progress Notes (Signed)
Physical Therapy Treatment Patient Details Name: Monique Gonzalez MRN: [id number] DOB: 1955/11/04 Today's Date: 08/11/2016    History of Present Illness Pt is a 61 yo female with c/o R knee pain dx with end stage R knee OA, s/p R TKA 08/05/16 PMH significant for syncope, OA, and osteopenia.     PT Comments    Pt performed decreased mobility during session secondary to increased pain in L knee.  Pt performed exercises with her daughter and there fore deferred this am.  Pt requesting pain meds during session.    Follow Up Recommendations  SNF     Equipment Recommendations  None recommended by PT    Recommendations for Other Services       Precautions / Restrictions Precautions Precautions: Fall;Knee;Other (comment) Precaution Booklet Issued: No Restrictions Weight Bearing Restrictions: Yes RLE Weight Bearing: Weight bearing as tolerated    Mobility  Bed Mobility Overal bed mobility: Needs Assistance       Supine to sit: Min assist     General bed mobility comments: Pt attempted to advance RLE to edge of bed able to move off of bed but then returned quickly due to pain.  Pt then required min assist to guide RLE to edge of bed due to pain.  Daughter provided assistance.    Transfers Overall transfer level: Needs assistance Equipment used: Rolling walker (2 wheeled) Transfers: Sit to/from Stand Sit to Stand: Min guard         General transfer comment: Cues for hand placement to and from seated surface.  Improved eccentric loading during stand to sit.    Ambulation/Gait Ambulation/Gait assistance: Min guard Ambulation Distance (Feet):  (64ft + 74ft  seated rest break between trials.  Gait distance limited due to pain.   ) Assistive device: Rolling walker (2 wheeled) Gait Pattern/deviations: Decreased stride length;Antalgic;Step-through pattern;Trunk flexed Gait velocity: decreased Gait velocity interpretation: Below normal speed for age/gender General Gait Details: Pt  denies dizziness but reports increased pain.  Pt flexing at hips and resting B elbows on RW.  Cues for upright posture.  Pt required cues for softneing heel strike on LLE.    Stairs            Wheelchair Mobility    Modified Rankin (Stroke Patients Only)       Balance                                            Cognition Arousal/Alertness: Awake/alert Behavior During Therapy: WFL for tasks assessed/performed Overall Cognitive Status: Within Functional Limits for tasks assessed                                        Exercises Total Joint Exercises Goniometric ROM: 0-70 degrees R knee Other Exercises Other Exercises: Pt and daughter reports she has just completed supine exercises.      General Comments        Pertinent Vitals/Pain Pain Assessment: 0-10 Pain Score: 9  Pain Location: R knee Pain Descriptors / Indicators: Stabbing;Shooting;Throbbing (reports increased pain after LOB in bathroom last night,  She did not fall and was able to regain balance but has been in more pain since.  ) Pain Intervention(s): Repositioned;Ice applied;Patient requesting pain meds-RN notified    Home Living  Prior Function            PT Goals (current goals can now be found in the care plan section) Acute Rehab PT Goals Patient Stated Goal: Go home  Potential to Achieve Goals: Fair Progress towards PT goals: Progressing toward goals    Frequency    7X/week      PT Plan Current plan remains appropriate    Co-evaluation              AM-PAC PT "6 Clicks" Daily Activity  Outcome Measure  Difficulty turning over in bed (including adjusting bedclothes, sheets and blankets)?: A Little Difficulty moving from lying on back to sitting on the side of the bed? : Total Difficulty sitting down on and standing up from a chair with arms (e.g., wheelchair, bedside commode, etc,.)?: A Lot Help needed moving to and  from a bed to chair (including a wheelchair)?: A Little Help needed walking in hospital room?: A Little Help needed climbing 3-5 steps with a railing? : A Little 6 Click Score: 15    End of Session Equipment Utilized During Treatment: Gait belt Activity Tolerance: Patient tolerated treatment well Patient left: in chair;with call bell/phone within reach;with family/visitor present Nurse Communication: Mobility status PT Visit Diagnosis: Unsteadiness on feet (R26.81);Other abnormalities of gait and mobility (R26.89);Muscle weakness (generalized) (M62.81);Pain;Dizziness and giddiness (R42) Pain - Right/Left: Right Pain - part of body: Knee     Time: 0829-0900 PT Time Calculation (min) (ACUTE ONLY): 31 min  Charges:  $Gait Training: 8-22 mins $Therapeutic Activity: 8-22 mins                    G Codes:       [name], PTA pager [phone]    [name] 08/11/2016, 9:08 AM

## 2016-08-11 NOTE — Progress Notes (Signed)
[name], female   DOB: 04-01-1955, 61 y.o.   MRN: [id number] Doing well overall.  Vitals and knee stable.  No longer needs an acute hospital bed.  Can be discharged today.

## 2016-08-11 NOTE — Social Work (Addendum)
Clinical Social Worker facilitated patient discharge including contacting patient family and facility to confirm patient discharge plans.  Clinical information faxed to facility and family agreeable with plan.  CSW arranged ambulance transport via [facility] to [facility].  RN to call [phone], ask for [facility] report prior to discharge. Patient going to Rm [id number].  Clinical Social Worker will sign off for now as social work intervention is no longer needed. Please consult [name] again if new need arises.  Elissa Hefty, LCSW Clinical Social Worker [phone]

## 2016-08-11 NOTE — Progress Notes (Signed)
Pt had a dizzy spell and nearly fell while ambulating to the bathroom. BP taken 136/48  . Pt returned to bed with 2 assists. Will continue to monitor.

## 2016-08-11 NOTE — Clinical Social Work Placement (Signed)
   CLINICAL SOCIAL WORK PLACEMENT  NOTE  Date:  08/11/2016  Patient Details  Name: Monique Gonzalez MRN: [id number] Date of Birth: July 01, 1955  Clinical Social Work is seeking post-discharge placement for this patient at the [place] level of care (*CSW will initial, date and re-position this form in  chart as items are completed):  Yes   Patient/family provided with [place] Work Department's list of facilities offering this level of care within the geographic area requested by the patient (or if unable, by the patient's family).  Yes   Patient/family informed of their freedom to choose among providers that offer the needed level of care, that participate in Medicare, Medicaid or managed care program needed by the patient, have an available bed and are willing to accept the patient.  Yes   Patient/family informed of [facility]'s ownership interest in [facility] and [facility], as well as of the fact that they are under no obligation to receive care at these facilities.  PASRR submitted to EDS on       PASRR number received on 08/07/16     Existing PASRR number confirmed on 08/07/16     FL2 transmitted to all facilities in geographic area requested by pt/family on 08/07/16     FL2 transmitted to all facilities within larger geographic area on 08/07/16     Patient informed that his/her managed care company has contracts with or will negotiate with certain facilities, including the following:        Yes   Patient/family informed of bed offers received.  Patient chooses bed at [facility]     Physician recommends and patient chooses bed at      Patient to be transferred to [facility] on 08/11/16.  Patient to be transferred to facility by [facility]     Patient family notified on 08/11/16 of transfer.  Name of family member notified:  Judson Roch, daughter     PHYSICIAN Please prepare priority discharge summary, including medications, Please  prepare prescriptions, Please sign FL2     Additional Comment:    _______________________________________________ Normajean Baxter, LCSW 08/11/2016, 10:28 AM

## 2016-08-11 NOTE — Discharge Summary (Signed)
[name] MRN: [id number] DOB/AGE: [birth year] 61 y.o.  Admit date: 08/05/2016 Discharge date: 08/11/2016  Admission Diagnoses:  Principal Problem:   Unilateral primary osteoarthritis, right knee Active Problems:   Status post total right knee replacement   Discharge Diagnoses:  Same  Past Medical History:  Diagnosis Date  . Allergy   . Chronic pain    back and knee  . Colitis 09/2012   infectious vs inflammatory.   . Colon stricture ([place])   . Depression   . Hyperlipidemia   . Obesity   . Osteoarthritis   . Osteopenia   . Recurrent cold sores   . Seasonal allergies   . Syncope and collapse 11/30/2012   Normal EEG.  Vaso vagal syncope    Surgeries: Procedure(s): RIGHT TOTAL KNEE ARTHROPLASTY on 08/05/2016   Consultants:   Discharged Condition: Improved  Hospital Course: Monique Gonzalez is an 61 y.o. female who was admitted 08/05/2016 for operative treatment ofUnilateral primary osteoarthritis, right knee. Patient has severe unremitting pain that affects sleep, daily activities, and work/hobbies. After pre-op clearance the patient was taken to the operating room on 08/05/2016 and underwent  Procedure(s): RIGHT TOTAL KNEE ARTHROPLASTY.    Patient was given perioperative antibiotics: Anti-infectives    Start     Dose/Rate Route Frequency Ordered Stop   08/05/16 2100  clindamycin (CLEOCIN) IVPB 600 mg     600 mg 100 mL/hr over 30 Minutes Intravenous Every 6 hours 08/05/16 2021 08/06/16 0339   08/05/16 1130  clindamycin (CLEOCIN) IVPB 900 mg     900 mg 100 mL/hr over 30 Minutes Intravenous To [facility] 08/03/16 1403 08/05/16 1255       Patient was given sequential compression devices, early ambulation, and chemoprophylaxis to prevent DVT.  Patient benefited maximally from hospital stay and there were no complications.    Recent vital signs: Patient Vitals for the past 24 hrs:  BP Temp Temp src Pulse Resp SpO2  08/11/16 0417 (!) 136/48 98.8 F  (37.1 C) Oral 89 18 100 %  08/10/16 2001 (!) 128/50 98.8 F (37.1 C) Oral 78 18 100 %  08/10/16 1300 124/76 98.1 F (36.7 C) Oral 72 18 100 %     Recent laboratory studies: No results for input(s): WBC, HGB, HCT, PLT, NA, K, CL, CO2, BUN, CREATININE, GLUCOSE, INR, CALCIUM in the last 72 hours.  Invalid input(s): PT, 2   Discharge Medications:   Allergies as of 08/11/2016      Reactions   Penicillins Rash, Other (See Comments)   Has patient had a PCN reaction causing immediate rash, facial/tongue/throat swelling, SOB or lightheadedness with hypotension: Unknown Has patient had a PCN reaction causing severe rash involving mucus membranes or skin necrosis: Unknown Has patient had a PCN reaction that required hospitalization: No Has patient had a PCN reaction occurring within the last 10 years: No If all of the above answers are "NO", then may proceed with Cephalosporin use. Childhood reaction.   Sulfa Antibiotics Rash      Medication List    STOP taking these medications   meloxicam 15 MG tablet Commonly known as:  MOBIC     TAKE these medications   acetaminophen 650 MG CR tablet Commonly known as:  TYLENOL Take 1,300 mg by mouth at bedtime.   acyclovir cream 5 % Commonly known as:  ZOVIRAX Apply 1 application topically every 3 (three) hours. What changed:  when to take this  reasons to take this   aspirin 325  MG EC tablet Take 1 tablet (325 mg total) by mouth 2 (two) times daily after a meal.   atorvastatin 40 MG tablet Commonly known as:  LIPITOR Take 1 tablet (40 mg total) by mouth daily. What changed:  when to take this   buPROPion 200 MG 12 hr tablet Commonly known as:  WELLBUTRIN SR Take 1 tablet (200 mg total) by mouth daily.   cetirizine 10 MG tablet Commonly known as:  ZYRTEC Take 10 mg by mouth at bedtime.   citalopram 20 MG tablet Commonly known as:  CELEXA Take 1 tablet (20 mg total) by mouth daily. What changed:  when to take this    cyclobenzaprine 10 MG tablet Commonly known as:  FLEXERIL Take 1 tablet (10 mg total) by mouth 3 (three) times daily as needed for muscle spasms.   fluticasone 50 MCG/ACT nasal spray Commonly known as:  FLONASE Place 2 sprays into both nostrils daily. What changed:  when to take this   loratadine 10 MG tablet Commonly known as:  CLARITIN Take 10 mg by mouth at bedtime.   multivitamin,tx-minerals tablet Take 1 tablet by mouth daily. POWDER MIX   oxyCODONE-acetaminophen 5-325 MG tablet Commonly known as:  ROXICET Take 1-2 tablets by mouth every 4 (four) hours as needed.   valACYclovir 1000 MG tablet Commonly known as:  VALTREX TAKE 2 TABLETS BY MOUTH EVERY 12 HOURS FOR 2 DOSES AS NEEDED FOR FLARE What changed:  how much to take  how to take this  when to take this  reasons to take this  additional instructions   Vitamin D (Ergocalciferol) 50000 units Caps capsule Commonly known as:  DRISDOL Take 1 capsule (50,000 Units total) by mouth every 7 (seven) days. What changed:  when to take this  additional instructions            Durable Medical Equipment        Start     Ordered   08/05/16 2022  DME Walker rolling  Once    Question:  Patient needs a walker to treat with the following condition  Answer:  Status post total right knee replacement   08/05/16 2021   08/05/16 2022  DME 3 n 1  Once     08/05/16 2021      Diagnostic Studies: Dg Knee Right Port  Result Date: 08/05/2016 CLINICAL DATA:  Status post right total knee joint prosthesis placement. EXAM: PORTABLE RIGHT KNEE - 1-2 VIEW COMPARISON:  Right knee series of June 06, 2016 FINDINGS: Radiographic positioning of the prosthetic components is good. The interface with the native bone appears normal. With the native bone itself exhibits no suspicious findings. IMPRESSION: No immediate postprocedure complication following right total knee joint prosthesis placement. Electronically Signed   By: David   Martinique M.D.   On: 08/05/2016 15:19    Disposition: to skilled nursing  Discharge Instructions    Discharge patient    Complete by:  As directed    Discharge disposition:  03-Skilled [place]   Discharge patient date:  08/08/2016   Discharge patient    Complete by:  As directed    Discharge disposition:  03-Skilled [place]   Discharge patient date:  08/11/2016       Contact information for follow-up providers    [facility]   Specialty:  [place] Why:  A representative from [facility] will contact you to arrange start date and time for your therapy. Contact information: [address] [place] [address] [phone]        Mcarthur Rossetti, MD Follow up in 2 week(s).   Specialty:  Orthopedic Surgery Contact information: [place] [address] [phone]            Contact information for after-discharge care    Destination    [facility] Follow up.   Specialty:  Skilled Nursing Facility Contact information: [place] [phone]                   Signed: Mcarthur Rossetti 08/11/2016, 7:45 AM

## 2016-08-11 NOTE — Progress Notes (Signed)
Report called to Daniel-RN at [facility]. All questions/concerns addressed. IV removed and belongings gathered. [facility] to transport. Will continue to monitor

## 2016-08-15 MED FILL — valACYclovir HCL 1 GM TABS: 1 | 10 days supply | Qty: 30 | Fill #3

## 2016-08-18 ENCOUNTER — Ambulatory Visit (INDEPENDENT_AMBULATORY_CARE_PROVIDER_SITE_OTHER): Payer: 59 | Admitting: Family Medicine

## 2016-08-18 ENCOUNTER — Ambulatory Visit (INDEPENDENT_AMBULATORY_CARE_PROVIDER_SITE_OTHER): Payer: 59 | Admitting: Physician Assistant

## 2016-08-18 ENCOUNTER — Encounter (INDEPENDENT_AMBULATORY_CARE_PROVIDER_SITE_OTHER): Payer: Self-pay | Admitting: Physician Assistant

## 2016-08-18 ENCOUNTER — Inpatient Hospital Stay (INDEPENDENT_AMBULATORY_CARE_PROVIDER_SITE_OTHER): Payer: 59 | Admitting: Orthopaedic Surgery

## 2016-08-18 DIAGNOSIS — Z96651 Presence of right artificial knee joint: Secondary | ICD-10-CM

## 2016-08-18 NOTE — Progress Notes (Signed)
Monique Gonzalez returns today 2 weeks status post right total knee arthroplasty. She states overall is doing well having quite a bit of pain. She still at a skilled facility. She had no chest pain shortness breath fevers chills.  Physical exam right knee she has full extension flexion to 85. Surgical incisions healing well no signs of infection. Calf supple nontender.  Assessment and plan: 2 weeks status post right total knee arthroplasty. She'll continue work with physical therapy on range of motion strengthening. She'll initially have home health physical therapy whenever she leaves the skilled facility. She'll call our office if she needs to schedule outpatient therapy prior to her follow-up in one month. She does state she bike outpatient therapy to be at the [facility] out on [address]. Scar tissue mobilization encouraged. Questions encouraged and answered.

## 2016-08-21 ENCOUNTER — Encounter (INDEPENDENT_AMBULATORY_CARE_PROVIDER_SITE_OTHER): Payer: Self-pay | Admitting: Family Medicine

## 2016-08-21 ENCOUNTER — Encounter (INDEPENDENT_AMBULATORY_CARE_PROVIDER_SITE_OTHER): Payer: Self-pay | Admitting: Physician Assistant

## 2016-08-22 DIAGNOSIS — Z96659 Presence of unspecified artificial knee joint: Secondary | ICD-10-CM | POA: Diagnosis not present

## 2016-08-25 ENCOUNTER — Telehealth (INDEPENDENT_AMBULATORY_CARE_PROVIDER_SITE_OTHER): Payer: Self-pay | Admitting: *Deleted

## 2016-08-25 MED ORDER — OXYCODONE-ACETAMINOPHEN 5-325 MG PO TABS: 1.0000 | ORAL_TABLET | Freq: Four times a day (QID) | ORAL | 0 refills | Status: DC | PRN

## 2016-08-25 MED FILL — ATORVASTATIN 40 MG TABLET: 40 | 30 days supply | Qty: 30 | Fill #0

## 2016-08-25 MED FILL — ACYCLOVIR 5% OINTMENT: 5 | 10 days supply | Qty: 15 | Fill #0

## 2016-08-25 MED FILL — CYCLOBENZAPRINE 10 MG TAB: 10 | 10 days supply | Qty: 30 | Fill #0

## 2016-08-25 MED FILL — BUPROPION HCL SR 200 MG TAB: 200 | 30 days supply | Qty: 30 | Fill #0

## 2016-08-25 MED FILL — OXYCODONE/APAP 5/325 MG TAB: 5-325 | 8 days supply | Qty: 60 | Fill #0

## 2016-08-25 MED FILL — CITALOPRAM HBR 20 MG TABLET: 20 | 30 days supply | Qty: 30 | Fill #0

## 2016-08-25 NOTE — Telephone Encounter (Signed)
Pt called wanting to know if Dr. Ninfa Linden can refill her percocet 5-325mg , states [facility] her and told her the prescription that was written by the NP at the rehab faciliity her DEA does not allow her to write narcotics, wants to get this filled before her sister leaves the rehab facilty so she doesn't have to go back to [place].  [facility]  C/B [phone]

## 2016-08-25 NOTE — Telephone Encounter (Signed)
Patient aware Rx ready  

## 2016-08-25 NOTE — Telephone Encounter (Signed)
Can come and pick up script 

## 2016-08-25 NOTE — Telephone Encounter (Signed)
Please advise 

## 2016-08-26 ENCOUNTER — Telehealth (INDEPENDENT_AMBULATORY_CARE_PROVIDER_SITE_OTHER): Payer: Self-pay | Admitting: Orthopaedic Surgery

## 2016-08-26 NOTE — Telephone Encounter (Signed)
Lisa request verbal orders for home physical therapy 3 times a week for 4 weeks.

## 2016-08-26 NOTE — Telephone Encounter (Signed)
Called and gave verbal ok for therpay orders pt is s/p a right total knee 08/05/16

## 2016-08-27 DIAGNOSIS — R262 Difficulty in walking, not elsewhere classified: Secondary | ICD-10-CM | POA: Diagnosis not present

## 2016-08-29 DIAGNOSIS — R262 Difficulty in walking, not elsewhere classified: Secondary | ICD-10-CM | POA: Diagnosis not present

## 2016-09-01 DIAGNOSIS — R262 Difficulty in walking, not elsewhere classified: Secondary | ICD-10-CM | POA: Diagnosis not present

## 2016-09-01 DIAGNOSIS — Z96651 Presence of right artificial knee joint: Secondary | ICD-10-CM | POA: Diagnosis not present

## 2016-09-01 DIAGNOSIS — F329 Major depressive disorder, single episode, unspecified: Secondary | ICD-10-CM | POA: Diagnosis not present

## 2016-09-01 DIAGNOSIS — Z6837 Body mass index (BMI) 37.0-37.9, adult: Secondary | ICD-10-CM | POA: Diagnosis not present

## 2016-09-01 DIAGNOSIS — E669 Obesity, unspecified: Secondary | ICD-10-CM | POA: Diagnosis not present

## 2016-09-01 DIAGNOSIS — Z471 Aftercare following joint replacement surgery: Secondary | ICD-10-CM | POA: Diagnosis not present

## 2016-09-01 DIAGNOSIS — M8580 Other specified disorders of bone density and structure, unspecified site: Secondary | ICD-10-CM | POA: Diagnosis not present

## 2016-09-01 DIAGNOSIS — E785 Hyperlipidemia, unspecified: Secondary | ICD-10-CM | POA: Diagnosis not present

## 2016-09-01 DIAGNOSIS — Z87891 Personal history of nicotine dependence: Secondary | ICD-10-CM | POA: Diagnosis not present

## 2016-09-01 DIAGNOSIS — Z7982 Long term (current) use of aspirin: Secondary | ICD-10-CM | POA: Diagnosis not present

## 2016-09-02 ENCOUNTER — Ambulatory Visit (INDEPENDENT_AMBULATORY_CARE_PROVIDER_SITE_OTHER): Payer: 59 | Admitting: Family Medicine

## 2016-09-02 ENCOUNTER — Encounter (INDEPENDENT_AMBULATORY_CARE_PROVIDER_SITE_OTHER): Payer: Self-pay | Admitting: Orthopaedic Surgery

## 2016-09-02 VITALS — BP 111/76 | HR 112 | Temp 97.7°F | Ht 72.0 in | Wt 261.0 lb

## 2016-09-02 DIAGNOSIS — E669 Obesity, unspecified: Secondary | ICD-10-CM | POA: Diagnosis not present

## 2016-09-02 DIAGNOSIS — E559 Vitamin D deficiency, unspecified: Secondary | ICD-10-CM | POA: Diagnosis not present

## 2016-09-02 DIAGNOSIS — Z9189 Other specified personal risk factors, not elsewhere classified: Secondary | ICD-10-CM

## 2016-09-02 DIAGNOSIS — Z6835 Body mass index (BMI) 35.0-35.9, adult: Secondary | ICD-10-CM | POA: Diagnosis not present

## 2016-09-02 DIAGNOSIS — F3289 Other specified depressive episodes: Secondary | ICD-10-CM | POA: Diagnosis not present

## 2016-09-02 MED ORDER — BUPROPION HCL ER (SR) 200 MG PO TB12: 200.0000 mg | ORAL_TABLET | Freq: Every day | ORAL | 0 refills | Status: DC

## 2016-09-02 MED ORDER — VITAMIN D (ERGOCALCIFEROL) 1.25 MG (50000 UNIT) PO CAPS
50000.0000 [IU] | ORAL_CAPSULE | ORAL | 0 refills | Status: DC
Start: 2016-09-02 — End: 2016-09-24

## 2016-09-02 MED FILL — VIT D2 1.25 MG (50,000 UNIT: 1.25 MG | 28 days supply | Qty: 4 | Fill #0

## 2016-09-02 NOTE — Progress Notes (Signed)
Office: [phone]  /  Fax: [phone]   HPI:   Chief Complaint: OBESITY Monique Gonzalez is here to discuss her progress with her obesity treatment plan. She is on the  follow the Category 2 plan and is following her eating plan approximately 0 % of the time. She states she is exercising 0 minutes 0 times per week. Monique Gonzalez is 3 weeks out from right knee replacement. She spent time in rehab and was unable to follow her plan. She did well maintaining weight but is still retaining some fluid from surgery. Her weight is 261 lb (118.4 kg) today and has had a weight gain of 3 pounds over a period of 6 weeks since her last visit. She has lost 16 lbs since starting treatment with [name].  Vitamin D deficiency Monique Gonzalez has a diagnosis of vitamin D deficiency. She is currently stable on vit D and calcium and she denies nausea, vomiting or muscle weakness.  At risk for osteopenia Monique Gonzalez is at higher risk of osteopenia and osteoporosis due to vitamin D deficiency.   Depression with emotional eating behaviors Monique Gonzalez's mood is stable and she is doing well with emotional eating. [name] is struggles with emotional eating and using food for comfort to the extent that it is negatively impacting her health. She often snacks when she is not hungry. Monique Gonzalez sometimes feels she is out of control and then feels guilty that she made poor food choices. She has been working on behavior modification techniques to help reduce her emotional eating and has been somewhat successful. She shows no sign of suicidal or homicidal ideations.  Depression screen [name] 2/9 03/25/2016 11/22/2012  Decreased Interest 3 0  Down, Depressed, Hopeless 3 0  PHQ - 2 Score 6 0  Altered sleeping 3 -  Tired, decreased energy 3 -  Change in appetite 3 -  Feeling bad or failure about yourself  3 -  Trouble concentrating 3 -  Moving slowly or fidgety/restless 3 -  Suicidal thoughts 0 -  PHQ-9 Score 24 -      ALLERGIES: Allergies  Allergen Reactions  . Penicillins  Rash and Other (See Comments)    Has patient had a PCN reaction causing immediate rash, facial/tongue/throat swelling, SOB or lightheadedness with hypotension: Unknown Has patient had a PCN reaction causing severe rash involving mucus membranes or skin necrosis: Unknown Has patient had a PCN reaction that required hospitalization: No Has patient had a PCN reaction occurring within the last 10 years: No If all of the above answers are "NO", then may proceed with Cephalosporin use. Childhood reaction.  . Sulfa Antibiotics Rash    MEDICATIONS: Current Outpatient Prescriptions on File Prior to Visit  Medication Sig Dispense Refill  . acetaminophen (TYLENOL) 650 MG CR tablet Take 1,300 mg by mouth at bedtime.    Marland Kitchen acyclovir cream (ZOVIRAX) 5 % Apply 1 application topically every 3 (three) hours. (Patient taking differently: Apply 1 application topically every 3 (three) hours as needed (for cold sores). ) 15 g 3  . aspirin EC 325 MG EC tablet Take 1 tablet (325 mg total) by mouth 2 (two) times daily after a meal. 30 tablet 0  . atorvastatin (LIPITOR) 40 MG tablet Take 1 tablet (40 mg total) by mouth daily. (Patient taking differently: Take 40 mg by mouth at bedtime. ) 90 tablet 3  . cetirizine (ZYRTEC) 10 MG tablet Take 10 mg by mouth at bedtime.    . citalopram (CELEXA) 20 MG tablet Take 1 tablet (20 mg  total) by mouth daily. (Patient taking differently: Take 20 mg by mouth at bedtime. ) 90 tablet 3  . cyclobenzaprine (FLEXERIL) 10 MG tablet Take 1 tablet (10 mg total) by mouth 3 (three) times daily as needed for muscle spasms. 60 tablet 0  . fluticasone (FLONASE) 50 MCG/ACT nasal spray Place 2 sprays into both nostrils daily. (Patient taking differently: Place 2 sprays into both nostrils at bedtime. ) 16 g 6  . loratadine (CLARITIN) 10 MG tablet Take 10 mg by mouth at bedtime.     . Multiple Vitamins-Minerals (MULTIVITAMIN,TX-MINERALS) tablet Take 1 tablet by mouth daily. POWDER MIX    .  oxyCODONE-acetaminophen (ROXICET) 5-325 MG tablet Take 1-2 tablets by mouth every 6 (six) hours as needed. 60 tablet 0  . valACYclovir (VALTREX) 1000 MG tablet TAKE 2 TABLETS BY MOUTH EVERY 12 HOURS FOR 2 DOSES AS NEEDED FOR FLARE (Patient taking differently: Take 1,000 mg by mouth every 12 (twelve) hours as needed (for cold sores.). ) 30 tablet 11   No current facility-administered medications on file prior to visit.     PAST MEDICAL HISTORY: Past Medical History:  Diagnosis Date  . Allergy   . Chronic pain    back and knee  . Colitis 09/2012   infectious vs inflammatory.   . Colon stricture ([place])   . Depression   . Hyperlipidemia   . Obesity   . Osteoarthritis   . Osteopenia   . Recurrent cold sores   . Seasonal allergies   . Syncope and collapse 11/30/2012   Normal EEG.  Vaso vagal syncope    PAST SURGICAL HISTORY: Past Surgical History:  Procedure Laterality Date  . COLON RESECTION N/A 10/14/2013   Procedure: LAPAROSCOPIC MOBILIZATION OF SPLENIC FLEXURE, LAPAROSCOPIC EXTENDED LEFT COLECTOMY;  Surgeon: [name], MD;  Location: [facility];  Service: General;  Laterality: N/A;  . COLONOSCOPY N/A 10/12/2013   Procedure: COLONOSCOPY;  Surgeon: [name], MD;  Location: [facility]COPY;  Service: Endoscopy;  Laterality: N/A;  . DILATION AND CURETTAGE, DIAGNOSTIC / THERAPEUTIC  1987  . OPEN REDUCTION INTERNAL FIXATION (ORIF) DISTAL RADIAL FRACTURE Right 08/08/2014   Procedure: OPEN REDUCTION INTERNAL FIXATION (ORIF) DISTAL RADIAL FRACTURE;  Surgeon: Roseanne Kaufman, MD;  Location: [facility];  Service: Orthopedics;  Laterality: Right;  DVR Crosslock, MD available sometime around 1430-1500  . TONSILLECTOMY    . TOTAL KNEE ARTHROPLASTY Right 08/05/2016  . TOTAL KNEE ARTHROPLASTY Right 08/05/2016   Procedure: RIGHT TOTAL KNEE ARTHROPLASTY;  Surgeon: Mcarthur Rossetti, MD;  Location: [facility];  Service: Orthopedics;  Laterality: Right;    SOCIAL HISTORY: Social History  Substance Use Topics   . Smoking status: Former Smoker    Quit date: 10/27/1993  . Smokeless tobacco: Never Used  . Alcohol use Yes     Comment: one drink per week    FAMILY HISTORY: Family History  Problem Relation Age of Onset  . Stroke Mother   . Arthritis Mother   . Thyroid disease Mother   . Cancer Father   . Arthritis Sister   . Thyroid disease Sister   . Cancer Maternal Grandmother   . Stroke Maternal Grandfather   . Cancer Maternal Grandfather   . Colon cancer Maternal Grandfather   . Cancer Paternal Grandmother   . Heart disease Paternal Grandmother   . Colon cancer Paternal Grandmother   . Stroke Paternal Grandfather   . Arthritis Sister   . Obesity Sister   . Sudden death Neg Hx   .  Hypertension Neg Hx   . Hyperlipidemia Neg Hx   . Heart attack Neg Hx   . Diabetes Neg Hx   . Rectal cancer Neg Hx   . Stomach cancer Neg Hx   . Esophageal cancer Neg Hx     ROS: Review of Systems  Constitutional: Negative for weight loss.  Gastrointestinal: Negative for nausea and vomiting.  Musculoskeletal:       Negative muscle weakness  Psychiatric/Behavioral: Positive for depression. Negative for suicidal ideas.    PHYSICAL EXAM: Blood pressure 111/76, pulse (!) 112, temperature 97.7 F (36.5 C), temperature source Oral, height 6' (1.829 m), weight 261 lb (118.4 kg), SpO2 99 %. Body mass index is 35.4 kg/m. Physical Exam  RECENT LABS AND TESTS: BMET    Component Value Date/Time   NA 136 08/06/2016 0537   NA 142 03/25/2016 1341   K 4.6 08/06/2016 0537   CL 107 08/06/2016 0537   CO2 25 08/06/2016 0537   GLUCOSE 157 (H) 08/06/2016 0537   BUN 20 08/06/2016 0537   BUN 16 03/25/2016 1341   CREATININE 0.87 08/06/2016 0537   CREATININE 0.82 03/20/2015 1420   CALCIUM 8.4 (L) 08/06/2016 0537   GFRNONAA >60 08/06/2016 0537   GFRNONAA 77 03/20/2015 1420   GFRAA >60 08/06/2016 0537   GFRAA 89 03/20/2015 1420   Lab Results  Component Value Date   HGBA1C 5.3 03/25/2016   HGBA1C (H)  09/11/2009    5.7 (NOTE)                                                                       According to the ADA Clinical Practice Recommendations for 2011, when HbA1c is used as a screening test:   >=6.5%   Diagnostic of Diabetes Mellitus           (if abnormal result  is confirmed)  5.7-6.4%   Increased risk of developing Diabetes Mellitus  References:Diagnosis and Classification of Diabetes Mellitus,Diabetes GLOV,5643,32(RJJOA 1):S62-S69 and Standards of Medical Care in         Diabetes - 2011,Diabetes Care,2011,34  (Suppl 1):S11-S61.   Lab Results  Component Value Date   INSULIN 5.5 03/25/2016   CBC    Component Value Date/Time   WBC 9.9 08/06/2016 0641   RBC 3.49 (L) 08/06/2016 0641   HGB 10.0 (L) 08/06/2016 0641   HGB 12.5 03/25/2016 1341   HCT 31.3 (L) 08/06/2016 0641   HCT 37.2 03/25/2016 1341   PLT 189 08/06/2016 0641   MCV 89.7 08/06/2016 0641   MCV 88 03/25/2016 1341   MCH 28.7 08/06/2016 0641   MCHC 31.9 08/06/2016 0641   RDW 13.5 08/06/2016 0641   RDW 14.0 03/25/2016 1341   LYMPHSABS 1.2 03/25/2016 1341   MONOABS 0.5 03/20/2015 1420   EOSABS 0.0 03/25/2016 1341   BASOSABS 0.0 03/25/2016 1341   Iron/TIBC/Ferritin/ %Sat No results found for: IRON, TIBC, FERRITIN, IRONPCTSAT Lipid Panel     Component Value Date/Time   CHOL 172 03/25/2016 1341   TRIG 145 03/25/2016 1341   HDL 59 03/25/2016 1341   CHOLHDL 5.0 03/20/2015 1420   VLDL 35 (H) 03/20/2015 1420   LDLCALC 84 03/25/2016 1341   Hepatic Function Panel     Component Value Date/Time  PROT 6.5 03/25/2016 1341   ALBUMIN 4.2 03/25/2016 1341   AST 16 03/25/2016 1341   ALT 14 03/25/2016 1341   ALKPHOS 72 03/25/2016 1341   BILITOT 0.4 03/25/2016 1341      Component Value Date/Time   TSH 1.420 03/25/2016 1341   TSH 1.530 03/20/2015 1420   TSH 2.427 12/04/2014 0908    ASSESSMENT AND PLAN: Vitamin D deficiency - Plan: Vitamin D, Ergocalciferol, (DRISDOL) 50000 units CAPS capsule  Other depression  - Plan: buPROPion (WELLBUTRIN SR) 200 MG 12 hr tablet  At risk for osteoporosis  Class 2 obesity without serious comorbidity with body mass index (BMI) of 35.0 to 35.9 in adult, unspecified obesity type  PLAN:  Vitamin D Deficiency Mardel was informed that low vitamin D levels contributes to fatigue and are associated with obesity, breast, and colon cancer. She agrees to continue to take prescription Vit D @50 ,000 IU every week, we will refill for 1 month and will follow up for routine testing of vitamin D, at least 2-3 times per year. She was informed of the risk of over-replacement of vitamin D and agrees to not increase her dose unless he discusses this with [name] first. Keundra agrees to follow up with our clinic in 3 weeks.  At risk for osteopenia Cannon is at risk for osteopenia and osteoporsis due to her vitamin D deficiency. She was encouraged to take her vitamin D and follow her higher calcium diet and increase strengthening exercise to help strengthen her bones and decrease her risk of osteopenia and osteoporosis.  Depression with Emotional Eating Behaviors We discussed behavior modification techniques today to help Elaynah deal with her emotional eating and depression. She has agreed to continue to take Wellbutrin SR 200 mg qd and agreed to follow up as directed.  Obesity Shanee is currently in the action stage of change. As such, her goal is to continue with weight loss efforts She has agreed to get back to follow the Category 2 plan Jaala has been instructed to work up to a goal of 150 minutes of combined cardio and strengthening exercise per week for weight loss and overall health benefits. We discussed the following Behavioral Modification Strategies today: increase H2O intake, better snacking choices, increasing lean protein intake and decreasing sodium intake  Tecla has agreed to follow up with our clinic in 3 weeks. She was informed of the importance of frequent follow up visits to maximize her  success with intensive lifestyle modifications for her multiple health conditions.  I, Doreene Nest, am acting as transcriptionist for Dennard Nip, MD  I have reviewed the above documentation for accuracy and completeness, and I agree with the above. -Dennard Nip, MD  OBESITY BEHAVIORAL INTERVENTION VISIT  Today's visit was # 10 out of 22.  Starting weight: 277 lbs Starting date: 03/25/16 Today's weight : 261 lbs  Today's date: 09/02/2016 Total lbs lost to date: 16 (Patients must lose 7 lbs in the first 6 months to continue with counseling)   ASK: We discussed the diagnosis of obesity with [name] today and Maudie Mercury agreed to give [name] permission to discuss obesity behavioral modification therapy today.  ASSESS: Gae has the diagnosis of obesity and her BMI today is 35.5 Chiquetta is in the action stage of change   ADVISE: Ellene was educated on the multiple health risks of obesity as well as the benefit of weight loss to improve her health. She was advised of the need for long term treatment and the importance  of lifestyle modifications.  AGREE: Multiple dietary modification options and treatment options were discussed and  Malaiyah agreed to get back to follow the Category 2 plan We discussed the following Behavioral Modification Strategies today: increase H2O intake, better sncaking choices, increasing lean protein intake and decreasing sodium intake

## 2016-09-03 ENCOUNTER — Encounter: Payer: Self-pay | Admitting: *Deleted

## 2016-09-03 ENCOUNTER — Other Ambulatory Visit: Payer: Self-pay | Admitting: *Deleted

## 2016-09-03 ENCOUNTER — Telehealth (INDEPENDENT_AMBULATORY_CARE_PROVIDER_SITE_OTHER): Payer: Self-pay

## 2016-09-03 DIAGNOSIS — Z7982 Long term (current) use of aspirin: Secondary | ICD-10-CM | POA: Diagnosis not present

## 2016-09-03 DIAGNOSIS — E669 Obesity, unspecified: Secondary | ICD-10-CM | POA: Diagnosis not present

## 2016-09-03 DIAGNOSIS — Z87891 Personal history of nicotine dependence: Secondary | ICD-10-CM | POA: Diagnosis not present

## 2016-09-03 DIAGNOSIS — M8580 Other specified disorders of bone density and structure, unspecified site: Secondary | ICD-10-CM | POA: Diagnosis not present

## 2016-09-03 DIAGNOSIS — F329 Major depressive disorder, single episode, unspecified: Secondary | ICD-10-CM | POA: Diagnosis not present

## 2016-09-03 DIAGNOSIS — Z96651 Presence of right artificial knee joint: Secondary | ICD-10-CM | POA: Diagnosis not present

## 2016-09-03 DIAGNOSIS — Z6837 Body mass index (BMI) 37.0-37.9, adult: Secondary | ICD-10-CM | POA: Diagnosis not present

## 2016-09-03 DIAGNOSIS — Z471 Aftercare following joint replacement surgery: Secondary | ICD-10-CM | POA: Diagnosis not present

## 2016-09-03 DIAGNOSIS — R262 Difficulty in walking, not elsewhere classified: Secondary | ICD-10-CM | POA: Diagnosis not present

## 2016-09-03 DIAGNOSIS — E785 Hyperlipidemia, unspecified: Secondary | ICD-10-CM | POA: Diagnosis not present

## 2016-09-03 NOTE — Telephone Encounter (Signed)
Please set her up for outpatient physical therapy at [facility]. Thanks

## 2016-09-03 NOTE — Telephone Encounter (Signed)
Please advise Need order if so

## 2016-09-03 NOTE — Patient Outreach (Signed)
[facility]) Care Management  09/03/2016  Monique Gonzalez 04/27/1955 [id number]  Subjective: Telephone call to patient's home / mobile number, spoke with patient, and HIPAA verified.  Discussed [facility] Care Management [facility] Transition of care follow up, patient voiced understanding, and is in agreement to follow up.   Patient states she is doing well, came home from rehab facility on 08/25/16, currently receiving home health through [facility] has next orthopedic MD follow up appointment on 09/17/16, has completed initial hospital follow up appointment with surgeon, and will be scheduled for outpatient therapy once home health services are completed.  States she is also being followed MD (Dr. Leafy Ro) for weight management and will reschedule appointment that was cancelled while she was at the rehab facility. Patient states she has been approved for family medical leave act (FMLA) through 09/16/16 and will notify [employer] to obtain extension, because surgeon will not approve return to work until after follow up appointment on 09/17/16 if appropriate.   States she is tentatively scheduled to return to work on 09/22/16.   Patient states she is also accessing the following [facility] benefits: [facility],  hospital indemnity supplemental insurance, will file claims if appropriate, and verbally given contact number for [facility] [employer] to request itemized bill [phone]).   Patient states she does not have any education material, transition of care, care coordination, disease management, disease monitoring, transportation, community resource, or pharmacy needs at this time.  States she is very appreciative of the follow up and is in agreement to receive [facility] Management information.    Objective: Per chart review, patient hospitalized 08/05/16 - 08/11/16 for Unilateral primary osteoarthritis, right knee.  Status post total right knee replacement on 08/06/16.  Patient also has a  history of hyperlipidemia.     Assessment:  Received [facility] Transition of care referral via [facility] report on 08/05/16.  Transition of care follow up completed, no care management needs, and will proceed with case closure.    Plan: RNCM will send patient successful outreach letter, [facility] pamphlet, and magnet. RNCM will send case closure due to follow up completed / no care management needs request to [facility].   Halil Rentz H. Annia Friendly, BSN, [place] Management [facility] Telephonic CM Phone: [phone] Fax: [phone]

## 2016-09-03 NOTE — Telephone Encounter (Signed)
Patient would like to cancel HHPT and start Outpatient PT at [facility] in [facility].  Stated that she had spoken with Dr. Ninfa Linden.  Please Advise.  CB# is [phone].  Thank You.

## 2016-09-04 ENCOUNTER — Other Ambulatory Visit (INDEPENDENT_AMBULATORY_CARE_PROVIDER_SITE_OTHER): Payer: Self-pay

## 2016-09-04 DIAGNOSIS — Z96651 Presence of right artificial knee joint: Secondary | ICD-10-CM

## 2016-09-05 DIAGNOSIS — Z6837 Body mass index (BMI) 37.0-37.9, adult: Secondary | ICD-10-CM | POA: Diagnosis not present

## 2016-09-05 DIAGNOSIS — E669 Obesity, unspecified: Secondary | ICD-10-CM | POA: Diagnosis not present

## 2016-09-05 DIAGNOSIS — F329 Major depressive disorder, single episode, unspecified: Secondary | ICD-10-CM | POA: Diagnosis not present

## 2016-09-05 DIAGNOSIS — Z471 Aftercare following joint replacement surgery: Secondary | ICD-10-CM | POA: Diagnosis not present

## 2016-09-05 DIAGNOSIS — Z7982 Long term (current) use of aspirin: Secondary | ICD-10-CM | POA: Diagnosis not present

## 2016-09-05 DIAGNOSIS — M8580 Other specified disorders of bone density and structure, unspecified site: Secondary | ICD-10-CM | POA: Diagnosis not present

## 2016-09-05 DIAGNOSIS — E785 Hyperlipidemia, unspecified: Secondary | ICD-10-CM | POA: Diagnosis not present

## 2016-09-05 DIAGNOSIS — R262 Difficulty in walking, not elsewhere classified: Secondary | ICD-10-CM | POA: Diagnosis not present

## 2016-09-05 DIAGNOSIS — Z87891 Personal history of nicotine dependence: Secondary | ICD-10-CM | POA: Diagnosis not present

## 2016-09-05 DIAGNOSIS — Z96651 Presence of right artificial knee joint: Secondary | ICD-10-CM | POA: Diagnosis not present

## 2016-09-08 ENCOUNTER — Encounter (INDEPENDENT_AMBULATORY_CARE_PROVIDER_SITE_OTHER): Payer: Self-pay | Admitting: Orthopaedic Surgery

## 2016-09-08 DIAGNOSIS — Z471 Aftercare following joint replacement surgery: Secondary | ICD-10-CM | POA: Diagnosis not present

## 2016-09-08 DIAGNOSIS — Z87891 Personal history of nicotine dependence: Secondary | ICD-10-CM | POA: Diagnosis not present

## 2016-09-08 DIAGNOSIS — E785 Hyperlipidemia, unspecified: Secondary | ICD-10-CM | POA: Diagnosis not present

## 2016-09-08 DIAGNOSIS — M8580 Other specified disorders of bone density and structure, unspecified site: Secondary | ICD-10-CM | POA: Diagnosis not present

## 2016-09-08 DIAGNOSIS — Z7982 Long term (current) use of aspirin: Secondary | ICD-10-CM | POA: Diagnosis not present

## 2016-09-08 DIAGNOSIS — R262 Difficulty in walking, not elsewhere classified: Secondary | ICD-10-CM | POA: Diagnosis not present

## 2016-09-08 DIAGNOSIS — Z6837 Body mass index (BMI) 37.0-37.9, adult: Secondary | ICD-10-CM | POA: Diagnosis not present

## 2016-09-08 DIAGNOSIS — F329 Major depressive disorder, single episode, unspecified: Secondary | ICD-10-CM | POA: Diagnosis not present

## 2016-09-08 DIAGNOSIS — E669 Obesity, unspecified: Secondary | ICD-10-CM | POA: Diagnosis not present

## 2016-09-08 DIAGNOSIS — Z96651 Presence of right artificial knee joint: Secondary | ICD-10-CM | POA: Diagnosis not present

## 2016-09-09 ENCOUNTER — Encounter (INDEPENDENT_AMBULATORY_CARE_PROVIDER_SITE_OTHER): Payer: Self-pay

## 2016-09-11 ENCOUNTER — Ambulatory Visit: Payer: 59 | Attending: Orthopaedic Surgery | Admitting: Physical Therapy

## 2016-09-11 DIAGNOSIS — M25561 Pain in right knee: Secondary | ICD-10-CM | POA: Diagnosis not present

## 2016-09-11 DIAGNOSIS — R2689 Other abnormalities of gait and mobility: Secondary | ICD-10-CM | POA: Diagnosis not present

## 2016-09-11 DIAGNOSIS — M25661 Stiffness of right knee, not elsewhere classified: Secondary | ICD-10-CM

## 2016-09-11 DIAGNOSIS — R262 Difficulty in walking, not elsewhere classified: Secondary | ICD-10-CM | POA: Diagnosis not present

## 2016-09-11 DIAGNOSIS — M6281 Muscle weakness (generalized): Secondary | ICD-10-CM

## 2016-09-11 NOTE — Therapy (Signed)
[facility] [address]  [place], [place], [address] Phone: [phone]   Fax:  [phone]  Physical Therapy Evaluation  Patient Details  Name: Monique Gonzalez MRN: [id number] Date of Birth: 06-05-1955 Referring Provider: Jean Rosenthal, MD  Encounter Date: 09/11/2016      PT End of Session - 09/11/16 0800    Visit Number 1   Number of Visits 14   Date for PT Re-Evaluation 10/24/16   Authorization Type [facility]   PT Start Time 0800   PT Stop Time 0842   PT Time Calculation (min) 42 min   Activity Tolerance Patient tolerated treatment well   Behavior During Therapy [facility] for tasks assessed/performed      Past Medical History:  Diagnosis Date  . Allergy   . Chronic pain    back and knee  . Colitis 09/2012   infectious vs inflammatory.   . Colon stricture ([place])   . Depression   . Hyperlipidemia   . Obesity   . Osteoarthritis   . Osteopenia   . Recurrent cold sores   . Seasonal allergies   . Syncope and collapse 11/30/2012   Normal EEG.  Vaso vagal syncope    Past Surgical History:  Procedure Laterality Date  . COLON RESECTION N/A 10/14/2013   Procedure: LAPAROSCOPIC MOBILIZATION OF SPLENIC FLEXURE, LAPAROSCOPIC EXTENDED LEFT COLECTOMY;  Surgeon: [name], MD;  Location: [place];  Service: General;  Laterality: N/A;  . COLONOSCOPY N/A 10/12/2013   Procedure: COLONOSCOPY;  Surgeon: [name], MD;  Location: [facility];  Service: Endoscopy;  Laterality: N/A;  . DILATION AND CURETTAGE, DIAGNOSTIC / THERAPEUTIC  1987  . OPEN REDUCTION INTERNAL FIXATION (ORIF) DISTAL RADIAL FRACTURE Right 08/08/2014   Procedure: OPEN REDUCTION INTERNAL FIXATION (ORIF) DISTAL RADIAL FRACTURE;  Surgeon: Roseanne Kaufman, MD;  Location: [place];  Service: Orthopedics;  Laterality: Right;  DVR Crosslock, MD available sometime around 1430-1500  . TONSILLECTOMY    . TOTAL KNEE ARTHROPLASTY Right 08/05/2016  . TOTAL KNEE  ARTHROPLASTY Right 08/05/2016   Procedure: RIGHT TOTAL KNEE ARTHROPLASTY;  Surgeon: Mcarthur Rossetti, MD;  Location: [facility];  Service: Orthopedics;  Laterality: Right;    There were no vitals filed for this visit.       Subjective Assessment - 09/11/16 0803    Subjective R TKR on 08/05/16 followed by 2 weeks of inpatient rehab, then [facility] PT. No pain currently but does note pain after increased activity at times. Still feels limited with extended walking or mutiple trips up/down stairs. Planning on returning to work on 09/22/16.   Pertinent History R TKR 08/05/16   Limitations Walking;Standing;Sitting   How long can you sit comfortably? ~1 hr   How long can you walk comfortably? 5-10 minutes   Patient Stated Goals "Build my strength and endurance."   Currently in Pain? No/denies   Pain Score 0-No pain            [facility] PT Assessment - 09/11/16 0800      Assessment   Medical Diagnosis R TKR   Referring Provider Jean Rosenthal, MD   Next MD Visit 09/17/16   Prior Therapy 2 wks inpt rehab followed by [facility] PT     Balance Screen   Has the patient fallen in the past 6 months No   Has the patient had a decrease in activity level because of a fear of falling?  No   Is the patient reluctant to  leave their home because of a fear of falling?  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   Leisure caring for mother in SNF, would like to get back to walking up to 3 miles/day     Observation/Other Assessments   Focus on Therapeutic Outcomes (FOTO)   Knee - 49% (59% limitation); predicted 61% (39% limitation)     ROM / Strength   AROM / PROM / Strength AROM;PROM;Strength     AROM   AROM Assessment Site Knee   Right/Left Knee Right;Left   Right Knee Extension 5   Right Knee Flexion 109   Left Knee Extension -1   Left Knee Flexion 131     PROM   PROM Assessment Site Knee   Right/Left Knee Right   Right Knee Extension 0   Right Knee Flexion 110     Strength   Strength Assessment Site Hip;Knee   Right/Left Hip Right;Left   Right Hip Flexion 3+/5   Right Hip Extension 3-/5   Right Hip ABduction 3+/5   Right Hip ADduction 3/5   Left Hip Flexion 4/5   Left Hip Extension 3+/5   Left Hip ABduction 4/5   Left Hip ADduction 3+/5   Right/Left Knee Right;Left   Right Knee Flexion 4-/5   Right Knee Extension 4-/5   Left Knee Flexion 4+/5   Left Knee Extension 4+/5     Ambulation/Gait   Assistive device None   Gait Pattern Step-through pattern;Decreased weight shift to right;Decreased stance time - right;Decreased hip/knee flexion - right;Lateral trunk lean to left   Ambulation Surface Level;Indoor            Objective measurements completed on examination: See above findings.          [facility] Adult PT Treatment/Exercise - 09/11/16 0800      Exercises   Exercises Knee/Hip     Knee/Hip Exercises: Stretches   Passive Hamstring Stretch Right;30 seconds;1 rep   Passive Hamstring Stretch Limitations supine with strap   Quad Stretch Right;30 seconds;1 rep   Sports administrator Limitations prone with strap   ITB Stretch Right;30 seconds;1 rep   ITB Stretch Limitations supine with strap     Knee/Hip Exercises: Standing   Hip Flexion Both;10 reps;Knee bent   Hip Abduction Both;10 reps;Knee straight   Hip Extension Both;10 reps;Knee straight     Knee/Hip Exercises: Supine   Bridges Both;10 reps   Straight Leg Raises Right;10 reps   Straight Leg Raises Limitations cues for quad set prior to lifting leg                 PT Education - 09/11/16 0844    Education provided Yes   Education Details PT eval findings, anticipated POC & initial HEP   Person(s) Educated Patient   Methods Explanation;Demonstration;Handout   Comprehension Verbalized understanding;Returned demonstration;Need further instruction             PT Long Term Goals - 09/11/16 4193  PT LONG TERM GOAL #1   Title Independent with ongoing HEP by 10/24/16   Status New     PT LONG TERM GOAL #2   Title R knee AROM >/= 3-115 dg to allow for normal gait/stair mechanics by 10/24/16   Status New     PT LONG TERM GOAL #3   Title R hip and knee strength >/= 4/5 for improved stability by 10/24/16   Status New     PT LONG TERM GOAL #4   Title Pt will ambulate with normal gait pattern and even weight shift by 10/24/16   Status New     PT LONG TERM GOAL #5   Title Pt will ascend/descend stairs with reciprocal pattern by 10/24/16   Status New                Plan - 09/11/16 0857    Clinical Impression Statement Monique Gonzalez is a 61 y/o female who presents to OP PT for a low complexity eval 5 wks s/p R TKA on 08/05/16. Pt had 2 wk inpatient rehab stay followed by [facility] PT. Pt presents to PT w/o AD for ambulation and demonstrates a slight antalgic gait pattern on R with decreased R hip and knee flexion during swing through with increased trunk lean to L and decreased weight shift to R. Pt denies pain at time of eval. Assessment reveals R knee AROM 5-109 dg and PROM 0-110 dg. Mild/moderate swelling present in R knee and lower leg. Mild to moderate decreased strength evident in B hips (R > L) and R knee (refer to above MMT). POC will focus on improving LE soft tissue pliability, increasing R knee ROM, core/LE strengthening and stability training, gait training to normalize gait pattern, with manual therapy as indicated for ROM/pain/edema and modalities including vasopnuematic compression PRN for pain/edema.   History and Personal  Factors relevant to plan of care: obesity   Clinical Presentation Stable   Clinical Presentation due to: routine post-op course thus far   Clinical Decision Making Low   Rehab Potential Good   Clinical Impairments Affecting Rehab Potential obesity   PT Frequency 3x / week  3x/wk x 2 wks, then anticipate will reduce frequency to 2x/wk   PT Duration 6 weeks   PT Treatment/Interventions Patient/family education;ADLs/Self Care Home Management;Therapeutic exercise;Therapeutic activities;Functional mobility training;Gait training;Stair training;Neuromuscular re-education;Balance training;Manual techniques;Scar mobilization;Passive range of motion;Taping;Electrical Stimulation;Cryotherapy;Vasopneumatic Device   PT Next Visit Plan Review HEP; R knee ROM; B LE strengthening - progressing resistance and CKC; manual therapy as indicated for increased joint mobility & ROM; modalities PRN   Consulted and Agree with Plan of Care Patient      Patient will benefit from skilled therapeutic intervention in order to improve the following deficits and impairments:  Decreased range of motion, Impaired flexibility, Decreased strength, Difficulty walking, Abnormal gait, Decreased activity tolerance, Decreased endurance, Decreased balance, Decreased scar mobility, Pain  Visit Diagnosis: Stiffness of right knee, not elsewhere classified  Muscle weakness (generalized)  Difficulty in walking, not elsewhere classified  Other abnormalities of gait and mobility  Acute pain of right knee     Problem List Patient Active Problem List   Diagnosis Date Noted  . Status post total right knee replacement 08/05/2016  . Class 2 obesity without serious comorbidity with body mass index (BMI) of 35.0 to 35.9 in adult 07/23/2016  . Unilateral primary osteoarthritis, right knee 05/20/2016  . Obesity (BMI 35.0-39.9 without comorbidity) 05/06/2016  . Class 2 obesity without serious comorbidity with body  mass index (BMI) of  36.0 to 36.9 in adult 04/22/2016  . Class 2 obesity without serious comorbidity with body mass index (BMI) of 37.0 to 37.9 in adult 04/08/2016  . Vitamin D deficiency 04/08/2016  . Shortness of breath 03/25/2016  . Fracture, radius 08/08/2014  . Constipation 09/20/2013  . Syncope and collapse 11/30/2012  . Recurrent cold sores   . Right knee pain 11/23/2011  . Osteoarthritis   . Depression   . Hyperlipidemia     Percival Spanish, PT, MPT 09/11/2016, 9:21 AM  [facility] [address]  [place], [place], [address] Phone: [phone]   Fax:  [phone]  Name: Monique Gonzalez MRN: [id number] Date of Birth: Sep 28, 1955

## 2016-09-15 ENCOUNTER — Ambulatory Visit: Payer: 59 | Admitting: Physical Therapy

## 2016-09-15 DIAGNOSIS — R262 Difficulty in walking, not elsewhere classified: Secondary | ICD-10-CM

## 2016-09-15 DIAGNOSIS — M6281 Muscle weakness (generalized): Secondary | ICD-10-CM

## 2016-09-15 DIAGNOSIS — M25661 Stiffness of right knee, not elsewhere classified: Secondary | ICD-10-CM | POA: Diagnosis not present

## 2016-09-15 DIAGNOSIS — R2689 Other abnormalities of gait and mobility: Secondary | ICD-10-CM

## 2016-09-15 DIAGNOSIS — M25561 Pain in right knee: Secondary | ICD-10-CM

## 2016-09-15 NOTE — Therapy (Signed)
[facility] [address]  [place], [place], [address] Phone: [phone]   Fax:  [phone]  Physical Therapy Treatment  Patient Details  Name: Monique Gonzalez MRN: [id number] Date of Birth: [birth year] Referring Provider: Jean Rosenthal, MD  Encounter Date: 09/15/2016      PT End of Session - 09/15/16 0759    Visit Number 2   Number of Visits 14   Date for PT Re-Evaluation 10/24/16   Authorization Type [facility]   PT Start Time 0759   PT Stop Time 0900   PT Time Calculation (min) 61 min   Activity Tolerance Patient tolerated treatment well   Behavior During Therapy [facility] for tasks assessed/performed      Past Medical History:  Diagnosis Date  . Allergy   . Chronic pain    back and knee  . Colitis 09/2012   infectious vs inflammatory.   . Colon stricture ([place])   . Depression   . Hyperlipidemia   . Obesity   . Osteoarthritis   . Osteopenia   . Recurrent cold sores   . Seasonal allergies   . Syncope and collapse 11/30/2012   Normal EEG.  Vaso vagal syncope    Past Surgical History:  Procedure Laterality Date  . COLON RESECTION N/A 10/14/2013   Procedure: LAPAROSCOPIC MOBILIZATION OF SPLENIC FLEXURE, LAPAROSCOPIC EXTENDED LEFT COLECTOMY;  Surgeon: [name], MD;  Location: [place];  Service: General;  Laterality: N/A;  . COLONOSCOPY N/A 10/12/2013   Procedure: COLONOSCOPY;  Surgeon: [name], MD;  Location: [facility];  Service: Endoscopy;  Laterality: N/A;  . DILATION AND CURETTAGE, DIAGNOSTIC / THERAPEUTIC  1987  . OPEN REDUCTION INTERNAL FIXATION (ORIF) DISTAL RADIAL FRACTURE Right 08/08/2014   Procedure: OPEN REDUCTION INTERNAL FIXATION (ORIF) DISTAL RADIAL FRACTURE;  Surgeon: Roseanne Kaufman, MD;  Location: [place];  Service: Orthopedics;  Laterality: Right;  DVR Crosslock, MD available sometime around 1430-1500  . TONSILLECTOMY    . TOTAL KNEE ARTHROPLASTY Right 08/05/2016  . TOTAL KNEE ARTHROPLASTY  Right 08/05/2016   Procedure: RIGHT TOTAL KNEE ARTHROPLASTY;  Surgeon: Mcarthur Rossetti, MD;  Location: [place];  Service: Orthopedics;  Laterality: Right;    There were no vitals filed for this visit.      Subjective Assessment - 09/15/16 0802    Subjective Pt reporting her knee is "a little achy" this morning. States bridges bother her back.   Pertinent History R TKR 08/05/16   Patient Stated Goals "Build my strength and endurance."   Currently in Pain? Yes   Pain Score --  1-2/10   Pain Location Knee   Pain Orientation Right   Pain Descriptors / Indicators Aching   Pain Type Surgical pain   Pain Radiating Towards n/a   Pain Onset More than a month ago   Pain Frequency Intermittent                         [facility] Adult PT Treatment/Exercise - 09/15/16 0800      Self-Care   Self-Care Other Self-Care Comments   Other Self-Care Comments  pt instructed in self-rolling technique for HS using rolling pin     Knee/Hip Exercises: Stretches   Passive Hamstring Stretch Both;30 seconds;2 reps   Passive Hamstring Stretch Limitations supine with strap   Quad Stretch Right;30 seconds;2 reps   Quad Stretch Limitations prone with strap   ITB Stretch Both;30 seconds;2 reps   ITB  Stretch Limitations supine with strap   Gastroc Stretch Right;30 seconds;2 reps   Gastroc Stretch Limitations standing at wall     Knee/Hip Exercises: Aerobic   Recumbent Bike lvl 1 x 6'     Knee/Hip Exercises: Standing   Hip Flexion Both;15 reps;Knee bent;Stengthening   Hip Flexion Limitations 3#   Terminal Knee Extension Right;15 reps;Theraband   Theraband Level (Terminal Knee Extension) Level 4 (Blue)   Hip Abduction Both;15 reps;Knee straight;Stengthening   Abduction Limitations 3#   Hip Extension Both;15 reps;Knee straight;Stengthening   Extension Limitations 3#     Knee/Hip Exercises: Seated   Long Arc Quad Right;15 reps;Strengthening   Long Arc Quad Weight 3 lbs.   Other  Seated Knee/Hip Exercises R Fitter leg press (1 black) x15     Knee/Hip Exercises: Supine   Short Arc Quad Sets Right;15 reps;Strengthening   Short Arc Quad Sets Limitations 3#   Bridges Limitations deferred d/t c/o increased LBP   Straight Leg Raises Right;15 reps;Strengthening   Straight Leg Raises Limitations 3#     Modalities   Modalities Vasopneumatic     Vasopneumatic   Number Minutes Vasopneumatic  15 minutes   Vasopnuematic Location  Knee   Vasopneumatic Pressure Medium   Vasopneumatic Temperature  coldest     Manual Therapy   Manual Therapy Soft tissue mobilization   Soft tissue mobilization STM & IASTM with roller stick to R medial HS                     PT Long Term Goals - 09/15/16 0805      PT LONG TERM GOAL #1   Title Independent with ongoing HEP by 10/24/16   Status On-going     PT LONG TERM GOAL #2   Title R knee AROM >/= 3-115 dg to allow for normal gait/stair mechanics by 10/24/16   Status On-going     PT LONG TERM GOAL #3   Title R hip and knee strength >/= 4/5 for improved stability by 10/24/16   Status On-going     PT LONG TERM GOAL #4   Title Pt will ambulate with normal gait pattern and even weight shift by 10/24/16   Status On-going     PT LONG TERM GOAL #5   Title Pt will ascend/descend stairs with reciprocal pattern by 10/24/16   Status On-going               Plan - 09/15/16 0805    Clinical Impression Statement Pt reporting good tolerance for HEP additions with exception of bridges d/t increased LBP with attempt. Able to tolerate added resistance with majority of exercises today. Will plan for continued hip & knee strengthenging along with CKC progression.   Rehab Potential Good   Clinical Impairments Affecting Rehab Potential obesity   PT Treatment/Interventions Patient/family education;ADLs/Self Care Home Management;Therapeutic exercise;Therapeutic activities;Functional mobility training;Gait training;Stair  training;Neuromuscular re-education;Balance training;Manual techniques;Scar mobilization;Passive range of motion;Taping;Electrical Stimulation;Cryotherapy;Vasopneumatic Device   PT Next Visit Plan R knee ROM; B LE strengthening - progressing resistance and CKC; manual therapy as indicated for increased joint mobility & ROM; modalities PRN   Consulted and Agree with Plan of Care Patient      Patient will benefit from skilled therapeutic intervention in order to improve the following deficits and impairments:  Decreased range of motion, Impaired flexibility, Decreased strength, Difficulty walking, Abnormal gait, Decreased activity tolerance, Decreased endurance, Decreased balance, Decreased scar mobility, Pain  Visit Diagnosis: Stiffness of right knee, not elsewhere  classified  Muscle weakness (generalized)  Difficulty in walking, not elsewhere classified  Other abnormalities of gait and mobility  Acute pain of right knee     Problem List Patient Active Problem List   Diagnosis Date Noted  . Status post total right knee replacement 08/05/2016  . Class 2 obesity without serious comorbidity with body mass index (BMI) of 35.0 to 35.9 in adult 07/23/2016  . Unilateral primary osteoarthritis, right knee 05/20/2016  . Obesity (BMI 35.0-39.9 without comorbidity) 05/06/2016  . Class 2 obesity without serious comorbidity with body mass index (BMI) of 36.0 to 36.9 in adult 04/22/2016  . Class 2 obesity without serious comorbidity with body mass index (BMI) of 37.0 to 37.9 in adult 04/08/2016  . Vitamin D deficiency 04/08/2016  . Shortness of breath 03/25/2016  . Fracture, radius 08/08/2014  . Constipation 09/20/2013  . Syncope and collapse 11/30/2012  . Recurrent cold sores   . Right knee pain 11/23/2011  . Osteoarthritis   . Depression   . Hyperlipidemia     Percival Spanish, PT, MPT 09/15/2016, 11:41 AM  [facility] [address]  [place], [place], [address] Phone: [phone]   Fax:  [phone]  Name: Monique Gonzalez MRN: [id number] Date of Birth: [birth year]

## 2016-09-17 ENCOUNTER — Ambulatory Visit (INDEPENDENT_AMBULATORY_CARE_PROVIDER_SITE_OTHER): Payer: 59 | Admitting: Orthopaedic Surgery

## 2016-09-17 ENCOUNTER — Ambulatory Visit: Payer: 59

## 2016-09-17 DIAGNOSIS — M6281 Muscle weakness (generalized): Secondary | ICD-10-CM | POA: Diagnosis not present

## 2016-09-17 DIAGNOSIS — R262 Difficulty in walking, not elsewhere classified: Secondary | ICD-10-CM | POA: Diagnosis not present

## 2016-09-17 DIAGNOSIS — M25561 Pain in right knee: Secondary | ICD-10-CM | POA: Diagnosis not present

## 2016-09-17 DIAGNOSIS — M25661 Stiffness of right knee, not elsewhere classified: Secondary | ICD-10-CM

## 2016-09-17 DIAGNOSIS — R2689 Other abnormalities of gait and mobility: Secondary | ICD-10-CM | POA: Diagnosis not present

## 2016-09-17 DIAGNOSIS — Z96651 Presence of right artificial knee joint: Secondary | ICD-10-CM

## 2016-09-17 NOTE — Progress Notes (Signed)
The patient is now about 6 weeks status post a right total knee arthroplasty. She is doing well. She works as a Marine scientist and does not push is ready go back to work which she scheduled to go back next week. I agree with this as well. She is just transition to outpatient therapy recently. She is doing well. She's been having some leg swelling and the like to have a prescription for compressive garments.  On examination of her right knee it moves fluidly. It feels ligaments stable. Her range of motion is improving to past 90 of flexion at this point.  We will have her continue outpatient therapy and I did give her a prescription for compressive garments for her peripheral edema. 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 I'll reevaluate her in 4 weeks. No x-rays are needed. All questions were encouraged and answered. She is only taking Tylenol for pain at this point.

## 2016-09-17 NOTE — Therapy (Signed)
[facility] [address]  [place], [place], [address] Phone: [phone]   Fax:  [phone]  Physical Therapy Treatment  Patient Details  Name: Monique Gonzalez MRN: [id number] Date of Birth: 12/05/1955 Referring Provider: Jean Rosenthal, MD   Encounter Date: 09/17/2016      PT End of Session - 09/17/16 1456    Visit Number 3   Number of Visits 14   Date for PT Re-Evaluation 10/24/16   Authorization Type [facility]   PT Start Time 1448   PT Stop Time 1531   PT Time Calculation (min) 43 min   Activity Tolerance Patient tolerated treatment well   Behavior During Therapy [facility] for tasks assessed/performed      Past Medical History:  Diagnosis Date  . Allergy   . Chronic pain    back and knee  . Colitis 09/2012   infectious vs inflammatory.   . Colon stricture ([place])   . Depression   . Hyperlipidemia   . Obesity   . Osteoarthritis   . Osteopenia   . Recurrent cold sores   . Seasonal allergies   . Syncope and collapse 11/30/2012   Normal EEG.  Vaso vagal syncope    Past Surgical History:  Procedure Laterality Date  . COLON RESECTION N/A 10/14/2013   Procedure: LAPAROSCOPIC MOBILIZATION OF SPLENIC FLEXURE, LAPAROSCOPIC EXTENDED LEFT COLECTOMY;  Surgeon: [name], MD;  Location: [place];  Service: General;  Laterality: N/A;  . COLONOSCOPY N/A 10/12/2013   Procedure: COLONOSCOPY;  Surgeon: [name], MD;  Location[facility] ENDOSCOPY;  Service: Endoscopy;  Laterality: N/A;  . DILATION AND CURETTAGE, DIAGNOSTIC / THERAPEUTIC  1987  . OPEN REDUCTION INTERNAL FIXATION (ORIF) DISTAL RADIAL FRACTURE Right 08/08/2014   Procedure: OPEN REDUCTION INTERNAL FIXATION (ORIF) DISTAL RADIAL FRACTURE;  Surgeon: Roseanne Kaufman, MD;  Location: [place];  Service: Orthopedics;  Laterality: Right;  DVR Crosslock, MD available sometime around 1430-1500  . TONSILLECTOMY    . TOTAL KNEE ARTHROPLASTY Right 08/05/2016  . TOTAL KNEE  ARTHROPLASTY Right 08/05/2016   Procedure: RIGHT TOTAL KNEE ARTHROPLASTY;  Surgeon: Mcarthur Rossetti, MD;  Location: [place];  Service: Orthopedics;  Laterality: Right;    There were no vitals filed for this visit.      Subjective Assessment - 09/17/16 1454    Patient Stated Goals "Build my strength and endurance."   Currently in Pain? Yes   Pain Score 2    Pain Location Knee  medial R distal HS    Pain Orientation Right   Pain Descriptors / Indicators Aching   Pain Type Surgical pain   Pain Onset More than a month ago   Pain Frequency Intermittent   Multiple Pain Sites No            [facility] PT Assessment - 09/17/16 1507      Assessment   Medical Diagnosis R TKR   Referring Provider Jean Rosenthal, MD    Next MD Visit 8.7.18                     [facility] Adult PT Treatment/Exercise - 09/17/16 1505      Knee/Hip Exercises: Aerobic   Recumbent Bike lvl 1 x 6'     Knee/Hip Exercises: Standing   Heel Raises Both;15 reps   Heel Raises Limitations wt. shift over R LE for eccentric    Hip Flexion Both;15 reps;Knee bent;Stengthening   Hip Flexion Limitations red TB; at  counter    Hip Abduction Both;15 reps;Knee straight;Stengthening   Abduction Limitations red TB; at counter    Hip Extension Both;15 reps;Knee straight;Stengthening   Extension Limitations red TB; at counter   Functional Squat 15 reps;3 sets   Functional Squat Limitations mini squat at couter    Other Standing Knee Exercises L LE 3# forward, lateral toe clears to 11" step x 15 reps      Knee/Hip Exercises: Seated   Long Arc Quad Right;15 reps;Strengthening   Long Arc Quad Weight 3 lbs.     Knee/Hip Exercises: Supine   Straight Leg Raises Right;15 reps;Strengthening   Straight Leg Raises Limitations 3#   Other Supine Knee/Hip Exercises B knee flexion with heels on peanut p-ball and strap stretch x 10 reps                  PT Education - 09/17/16 1827    Education provided  Yes   Education Details standing calf stretch, counter squat    Person(s) Educated Patient   Methods Explanation;Demonstration;Verbal cues;Handout   Comprehension Verbalized understanding;Returned demonstration;Verbal cues required;Need further instruction             PT Long Term Goals - 09/15/16 0805      PT LONG TERM GOAL #1   Title Independent with ongoing HEP by 10/24/16   Status On-going     PT LONG TERM GOAL #2   Title R knee AROM >/= 3-115 dg to allow for normal gait/stair mechanics by 10/24/16   Status On-going     PT LONG TERM GOAL #3   Title R hip and knee strength >/= 4/5 for improved stability by 10/24/16   Status On-going     PT LONG TERM GOAL #4   Title Pt will ambulate with normal gait pattern and even weight shift by 10/24/16   Status On-going     PT LONG TERM GOAL #5   Title Pt will ascend/descend stairs with reciprocal pattern by 10/24/16   Status On-going               Plan - 09/17/16 1510    Clinical Impression Statement Pt. doing well today.  Tolerating hip/knee strengthening activity well with focus on R LE clearance with stepping.  HEP updated to include counter squat and standing calf stretch.  Good tolerance for manual strumming to medial, distal HS over area of most tenderness with some relief.  Pt. declining ice to end treatment planning to ice at home.   PT Treatment/Interventions Patient/family education;ADLs/Self Care Home Management;Therapeutic exercise;Therapeutic activities;Functional mobility training;Gait training;Stair training;Neuromuscular re-education;Balance training;Manual techniques;Scar mobilization;Passive range of motion;Taping;Electrical Stimulation;Cryotherapy;Vasopneumatic Device   PT Next Visit Plan R knee ROM; B LE strengthening - progressing resistance and CKC; manual therapy as indicated for increased joint mobility & ROM; modalities PRN      Patient will benefit from skilled therapeutic intervention in order to  improve the following deficits and impairments:  Decreased range of motion, Impaired flexibility, Decreased strength, Difficulty walking, Abnormal gait, Decreased activity tolerance, Decreased endurance, Decreased balance, Decreased scar mobility, Pain  Visit Diagnosis: Stiffness of right knee, not elsewhere classified  Muscle weakness (generalized)  Difficulty in walking, not elsewhere classified  Other abnormalities of gait and mobility  Acute pain of right knee     Problem List Patient Active Problem List   Diagnosis Date Noted  . Status post total right knee replacement 08/05/2016  . Class 2 obesity without serious comorbidity with body mass index (BMI) of  35.0 to 35.9 in adult 07/23/2016  . Unilateral primary osteoarthritis, right knee 05/20/2016  . Obesity (BMI 35.0-39.9 without comorbidity) 05/06/2016  . Class 2 obesity without serious comorbidity with body mass index (BMI) of 36.0 to 36.9 in adult 04/22/2016  . Class 2 obesity without serious comorbidity with body mass index (BMI) of 37.0 to 37.9 in adult 04/08/2016  . Vitamin D deficiency 04/08/2016  . Shortness of breath 03/25/2016  . Fracture, radius 08/08/2014  . Constipation 09/20/2013  . Syncope and collapse 11/30/2012  . Recurrent cold sores   . Right knee pain 11/23/2011  . Osteoarthritis   . Depression   . Hyperlipidemia     Bess Harvest, [place] 09/17/16 6:31 PM  [facility] [address]  [place], [place], [address] Phone: [phone]   Fax:  [phone]  Name: Monique Gonzalez MRN: [id number] Date of Birth: September 30, 1955

## 2016-09-19 ENCOUNTER — Ambulatory Visit: Payer: 59

## 2016-09-19 DIAGNOSIS — R2689 Other abnormalities of gait and mobility: Secondary | ICD-10-CM

## 2016-09-19 DIAGNOSIS — M6281 Muscle weakness (generalized): Secondary | ICD-10-CM

## 2016-09-19 DIAGNOSIS — M25661 Stiffness of right knee, not elsewhere classified: Secondary | ICD-10-CM

## 2016-09-19 DIAGNOSIS — R262 Difficulty in walking, not elsewhere classified: Secondary | ICD-10-CM | POA: Diagnosis not present

## 2016-09-19 DIAGNOSIS — M25561 Pain in right knee: Secondary | ICD-10-CM | POA: Diagnosis not present

## 2016-09-19 NOTE — Therapy (Signed)
[facility] [address]  [place], [place], [address] Phone: [phone]   Fax:  [phone]  Physical Therapy Treatment  Patient Details  Name: Monique Gonzalez MRN: [id number] Date of Birth: 09-Apr-1955 Referring Provider: Jean Rosenthal, MD   Encounter Date: 09/19/2016      PT End of Session - 09/19/16 0937    Visit Number 4   Number of Visits 14   Date for PT Re-Evaluation 10/24/16   Authorization Type [facility]   PT Start Time 0930   PT Stop Time 1015   PT Time Calculation (min) 45 min   Activity Tolerance Patient tolerated treatment well   Behavior During Therapy [facility] for tasks assessed/performed      Past Medical History:  Diagnosis Date  . Allergy   . Chronic pain    back and knee  . Colitis 09/2012   infectious vs inflammatory.   . Colon stricture ([place])   . Depression   . Hyperlipidemia   . Obesity   . Osteoarthritis   . Osteopenia   . Recurrent cold sores   . Seasonal allergies   . Syncope and collapse 11/30/2012   Normal EEG.  Vaso vagal syncope    Past Surgical History:  Procedure Laterality Date  . COLON RESECTION N/A 10/14/2013   Procedure: LAPAROSCOPIC MOBILIZATION OF SPLENIC FLEXURE, LAPAROSCOPIC EXTENDED LEFT COLECTOMY;  Surgeon: [name], MD;  Location: [place];  Service: General;  Laterality: N/A;  . COLONOSCOPY N/A 10/12/2013   Procedure: COLONOSCOPY;  Surgeon: [name], MD;  Location: [facility] ENDOSCOPY;  Service: Endoscopy;  Laterality: N/A;  . DILATION AND CURETTAGE, DIAGNOSTIC / THERAPEUTIC  1987  . OPEN REDUCTION INTERNAL FIXATION (ORIF) DISTAL RADIAL FRACTURE Right 08/08/2014   Procedure: OPEN REDUCTION INTERNAL FIXATION (ORIF) DISTAL RADIAL FRACTURE;  Surgeon: Roseanne Kaufman, MD;  Location: [place];  Service: Orthopedics;  Laterality: Right;  DVR Crosslock, MD available sometime around 1430-1500  . TONSILLECTOMY    . TOTAL KNEE ARTHROPLASTY Right 08/05/2016  . TOTAL KNEE  ARTHROPLASTY Right 08/05/2016   Procedure: RIGHT TOTAL KNEE ARTHROPLASTY;  Surgeon: Mcarthur Rossetti, MD;  Location: [place];  Service: Orthopedics;  Laterality: Right;    There were no vitals filed for this visit.      Subjective Assessment - 09/19/16 0934    Subjective Pt. doing well.  Notes some increased stiffness in R knee and muscular soreness in R quad.     Patient Stated Goals "Build my strength and endurance."   Currently in Pain? Yes   Pain Score 2    Pain Location Knee   Pain Orientation Right   Pain Descriptors / Indicators Sore   Pain Type Surgical pain   Pain Onset More than a month ago   Pain Frequency Intermittent   Multiple Pain Sites No            [facility] PT Assessment - 09/19/16 0941      PROM   Right Knee Flexion 115  with strap assist                      [facility] Adult PT Treatment/Exercise - 09/19/16 0939      Knee/Hip Exercises: Aerobic   Recumbent Bike lvl 1 x 6'  Relief of "stiffness" following this      Knee/Hip Exercises: Standing   Forward Step Up Right;Step Height: 4";Step Height: 6"   Forward Step Up Limitations x 8 reps each  way; good eccentric R quad control on 4" step; limited R quad control with 6" step and heavy UE support     Knee/Hip Exercises: Seated   Hamstring Curl Right;15 reps   Hamstring Limitations green TB high tension     Knee/Hip Exercises: Supine   Straight Leg Raises Right;Strengthening;20 reps  no quad lag without weight now   Straight Leg Raises Limitations 3#   Other Supine Knee/Hip Exercises B knee flexion with heels on peanut p-ball and strap stretch x 10 reps       Knee/Hip Exercises: Sidelying   Hip ABduction Right;15 reps   Hip ABduction Limitations 3#      Manual Therapy   Manual Therapy Soft tissue mobilization;Taping   Soft tissue mobilization STM & IASTM with roller stick to R medial HS   Kinesiotex Create Space     Kinesiotix   Create Space R knee Pes Anserine taping pattern                      PT Long Term Goals - 09/15/16 0805      PT LONG TERM GOAL #1   Title Independent with ongoing HEP by 10/24/16   Status On-going     PT LONG TERM GOAL #2   Title R knee AROM >/= 3-115 dg to allow for normal gait/stair mechanics by 10/24/16   Status On-going     PT LONG TERM GOAL #3   Title R hip and knee strength >/= 4/5 for improved stability by 10/24/16   Status On-going     PT LONG TERM GOAL #4   Title Pt will ambulate with normal gait pattern and even weight shift by 10/24/16   Status On-going     PT LONG TERM GOAL #5   Title Pt will ascend/descend stairs with reciprocal pattern by 10/24/16   Status On-going               Plan - 09/19/16 1211    Clinical Impression Statement Pt. doing well today however primary complaint is tenderness over medial R HS/Pes Anserine area.  Taping applied to this area today to promote reduction in tenderness and create space.  Pt. still with poor R quad control with eccentric descending on stairs with heavy rail use.  Strengthening activities focusing on improving quad strength with stair training on 4-6" step.  Pt. progressing well with ROM and strengthening activity today able to demo 115 PROM flexion today.  Declined ice to end treatment planning to ice at home.   PT Treatment/Interventions Patient/family education;ADLs/Self Care Home Management;Therapeutic exercise;Therapeutic activities;Functional mobility training;Gait training;Stair training;Neuromuscular re-education;Balance training;Manual techniques;Scar mobilization;Passive range of motion;Taping;Electrical Stimulation;Cryotherapy;Vasopneumatic Device   PT Next Visit Plan R knee ROM; B LE strengthening - progressing resistance and CKC; manual therapy as indicated for increased joint mobility & ROM; modalities PRN      Patient will benefit from skilled therapeutic intervention in order to improve the following deficits and impairments:  Decreased range of  motion, Impaired flexibility, Decreased strength, Difficulty walking, Abnormal gait, Decreased activity tolerance, Decreased endurance, Decreased balance, Decreased scar mobility, Pain  Visit Diagnosis: Stiffness of right knee, not elsewhere classified  Muscle weakness (generalized)  Difficulty in walking, not elsewhere classified  Other abnormalities of gait and mobility  Acute pain of right knee     Problem List Patient Active Problem List   Diagnosis Date Noted  . Status post total right knee replacement 08/05/2016  . Class 2 obesity without serious comorbidity  with body mass index (BMI) of 35.0 to 35.9 in adult 07/23/2016  . Unilateral primary osteoarthritis, right knee 05/20/2016  . Obesity (BMI 35.0-39.9 without comorbidity) 05/06/2016  . Class 2 obesity without serious comorbidity with body mass index (BMI) of 36.0 to 36.9 in adult 04/22/2016  . Class 2 obesity without serious comorbidity with body mass index (BMI) of 37.0 to 37.9 in adult 04/08/2016  . Vitamin D deficiency 04/08/2016  . Shortness of breath 03/25/2016  . Fracture, radius 08/08/2014  . Constipation 09/20/2013  . Syncope and collapse 11/30/2012  . Recurrent cold sores   . Right knee pain 11/23/2011  . Osteoarthritis   . Depression   . Hyperlipidemia     Bess Harvest, PTA 09/19/16 12:36 PM  [facility] [address]  [place], [place], [address] Phone: [phone]   Fax:  [phone]  Name: Monique Gonzalez MRN: [id number] Date of Birth: April 11, 1955

## 2016-09-21 ENCOUNTER — Encounter: Payer: Self-pay | Admitting: Physical Therapy

## 2016-09-22 ENCOUNTER — Ambulatory Visit: Payer: 59 | Admitting: Physical Therapy

## 2016-09-22 DIAGNOSIS — R2689 Other abnormalities of gait and mobility: Secondary | ICD-10-CM

## 2016-09-22 DIAGNOSIS — R262 Difficulty in walking, not elsewhere classified: Secondary | ICD-10-CM

## 2016-09-22 DIAGNOSIS — M25661 Stiffness of right knee, not elsewhere classified: Secondary | ICD-10-CM

## 2016-09-22 DIAGNOSIS — M6281 Muscle weakness (generalized): Secondary | ICD-10-CM

## 2016-09-22 DIAGNOSIS — M25561 Pain in right knee: Secondary | ICD-10-CM | POA: Diagnosis not present

## 2016-09-22 NOTE — Therapy (Signed)
[facility] [address]  [place], [place], [address] Phone: [phone]   Fax:  [phone]  Physical Therapy Treatment  Patient Details  Name: Monique Gonzalez MRN: [id number] Date of Birth: [birth year] Referring Provider: Jean Rosenthal, MD   Encounter Date: 09/22/2016      PT End of Session - 09/22/16 1024    Visit Number 5   Number of Visits 14   Date for PT Re-Evaluation 10/24/16   Authorization Type [facility]   PT Start Time 1022   PT Stop Time 1103   PT Time Calculation (min) 41 min   Activity Tolerance Patient tolerated treatment well   Behavior During Therapy [facility] for tasks assessed/performed      Past Medical History:  Diagnosis Date  . Allergy   . Chronic pain    back and knee  . Colitis 09/2012   infectious vs inflammatory.   . Colon stricture ([place])   . Depression   . Hyperlipidemia   . Obesity   . Osteoarthritis   . Osteopenia   . Recurrent cold sores   . Seasonal allergies   . Syncope and collapse 11/30/2012   Normal EEG.  Vaso vagal syncope    Past Surgical History:  Procedure Laterality Date  . COLON RESECTION N/A 10/14/2013   Procedure: LAPAROSCOPIC MOBILIZATION OF SPLENIC FLEXURE, LAPAROSCOPIC EXTENDED LEFT COLECTOMY;  Surgeon: [name], MD;  Location: [place];  Service: General;  Laterality: N/A;  . COLONOSCOPY N/A 10/12/2013   Procedure: COLONOSCOPY;  Surgeon: [name], MD;  Location: [facility] ENDOSCOPY;  Service: Endoscopy;  Laterality: N/A;  . DILATION AND CURETTAGE, DIAGNOSTIC / THERAPEUTIC  1987  . OPEN REDUCTION INTERNAL FIXATION (ORIF) DISTAL RADIAL FRACTURE Right 08/08/2014   Procedure: OPEN REDUCTION INTERNAL FIXATION (ORIF) DISTAL RADIAL FRACTURE;  Surgeon: Roseanne Kaufman, MD;  Location: [place];  Service: Orthopedics;  Laterality: Right;  DVR Crosslock, MD available sometime around 1430-1500  . TONSILLECTOMY    . TOTAL KNEE ARTHROPLASTY Right 08/05/2016  . TOTAL KNEE  ARTHROPLASTY Right 08/05/2016   Procedure: RIGHT TOTAL KNEE ARTHROPLASTY;  Surgeon: Mcarthur Rossetti, MD;  Location: [place];  Service: Orthopedics;  Laterality: Right;    There were no vitals filed for this visit.      Subjective Assessment - 09/22/16 1023    Subjective feeling well - tape helped; had some pain at posterior knee that has resolved today   Pertinent History R TKR 08/05/16   Patient Stated Goals "Build my strength and endurance."   Currently in Pain? No/denies   Pain Score 0-No pain                         [facility] Adult PT Treatment/Exercise - 09/22/16 0001      Knee/Hip Exercises: Stretches   Gastroc Stretch Right;3 reps;30 seconds   Gastroc Stretch Limitations prostretch     Knee/Hip Exercises: Aerobic   Recumbent Bike L2 x 6 minutes     Knee/Hip Exercises: Machines for Strengthening   Cybex Knee Extension 15# B LE 2 x 10; 10# B con/R ecc x 10 reps     Knee/Hip Exercises: Standing   Step Down Right;15 reps;Hand Hold: 2;Step Height: 4"   Step Down Limitations eccentric   Functional Squat 2 sets;15 reps   Functional Squat Limitations TRX      Knee/Hip Exercises: Supine   Straight Leg Raises Strengthening;Right;15 reps   Straight Leg Raises Limitations  4#   Straight Leg Raise with External Rotation Strengthening;Right;15 reps     Manual Therapy   Manual Therapy Joint mobilization   Joint Mobilization patellar mobs - all directions - some stiffness sup/inf; AP joint mobs to R knee                      PT Long Term Goals - 09/15/16 0805      PT LONG TERM GOAL #1   Title Independent with ongoing HEP by 10/24/16   Status On-going     PT LONG TERM GOAL #2   Title R knee AROM >/= 3-115 dg to allow for normal gait/stair mechanics by 10/24/16   Status On-going     PT LONG TERM GOAL #3   Title R hip and knee strength >/= 4/5 for improved stability by 10/24/16   Status On-going     PT LONG TERM GOAL #4   Title Pt will  ambulate with normal gait pattern and even weight shift by 10/24/16   Status On-going     PT LONG TERM GOAL #5   Title Pt will ascend/descend stairs with reciprocal pattern by 10/24/16   Status On-going               Plan - 09/22/16 1024    Clinical Impression Statement Patient doing well today with all strengthening progressions. Able to perform all tasks with good muscle control and tolerance. Patellar mobs today as patient is slightly limited in superior/inferior movements with movement improving after patellar mobs. Making good progress towards all goals.    PT Treatment/Interventions Patient/family education;ADLs/Self Care Home Management;Therapeutic exercise;Therapeutic activities;Functional mobility training;Gait training;Stair training;Neuromuscular re-education;Balance training;Manual techniques;Scar mobilization;Passive range of motion;Taping;Electrical Stimulation;Cryotherapy;Vasopneumatic Device   PT Next Visit Plan R knee ROM; B LE strengthening - progressing resistance and CKC; manual therapy as indicated for increased joint mobility & ROM; modalities PRN   Consulted and Agree with Plan of Care Patient      Patient will benefit from skilled therapeutic intervention in order to improve the following deficits and impairments:  Decreased range of motion, Impaired flexibility, Decreased strength, Difficulty walking, Abnormal gait, Decreased activity tolerance, Decreased endurance, Decreased balance, Decreased scar mobility, Pain  Visit Diagnosis: Stiffness of right knee, not elsewhere classified  Muscle weakness (generalized)  Difficulty in walking, not elsewhere classified  Other abnormalities of gait and mobility  Acute pain of right knee     Problem List Patient Active Problem List   Diagnosis Date Noted  . Status post total right knee replacement 08/05/2016  . Class 2 obesity without serious comorbidity with body mass index (BMI) of 35.0 to 35.9 in adult  07/23/2016  . Unilateral primary osteoarthritis, right knee 05/20/2016  . Obesity (BMI 35.0-39.9 without comorbidity) 05/06/2016  . Class 2 obesity without serious comorbidity with body mass index (BMI) of 36.0 to 36.9 in adult 04/22/2016  . Class 2 obesity without serious comorbidity with body mass index (BMI) of 37.0 to 37.9 in adult 04/08/2016  . Vitamin D deficiency 04/08/2016  . Shortness of breath 03/25/2016  . Fracture, radius 08/08/2014  . Constipation 09/20/2013  . Syncope and collapse 11/30/2012  . Recurrent cold sores   . Right knee pain 11/23/2011  . Osteoarthritis   . Depression   . Hyperlipidemia      Lanney Gins, PT, DPT 09/22/16 11:09 AM   [facility] [place], [place], [address] Phone:  [phone]   Fax:  [phone]  Name: Monique Gonzalez MRN: [id number] Date of Birth: 12-30-1955

## 2016-09-23 ENCOUNTER — Encounter (INDEPENDENT_AMBULATORY_CARE_PROVIDER_SITE_OTHER): Payer: Self-pay | Admitting: Orthopaedic Surgery

## 2016-09-24 ENCOUNTER — Ambulatory Visit (INDEPENDENT_AMBULATORY_CARE_PROVIDER_SITE_OTHER): Payer: 59 | Admitting: Family Medicine

## 2016-09-24 ENCOUNTER — Ambulatory Visit: Payer: 59 | Admitting: Physical Therapy

## 2016-09-24 VITALS — BP 111/74 | HR 82 | Temp 97.9°F | Ht 72.0 in | Wt 260.0 lb

## 2016-09-24 DIAGNOSIS — M25561 Pain in right knee: Secondary | ICD-10-CM | POA: Diagnosis not present

## 2016-09-24 DIAGNOSIS — E8881 Metabolic syndrome: Secondary | ICD-10-CM | POA: Diagnosis not present

## 2016-09-24 DIAGNOSIS — M25661 Stiffness of right knee, not elsewhere classified: Secondary | ICD-10-CM | POA: Diagnosis not present

## 2016-09-24 DIAGNOSIS — F3289 Other specified depressive episodes: Secondary | ICD-10-CM | POA: Diagnosis not present

## 2016-09-24 DIAGNOSIS — M6281 Muscle weakness (generalized): Secondary | ICD-10-CM

## 2016-09-24 DIAGNOSIS — Z6835 Body mass index (BMI) 35.0-35.9, adult: Secondary | ICD-10-CM | POA: Diagnosis not present

## 2016-09-24 DIAGNOSIS — E559 Vitamin D deficiency, unspecified: Secondary | ICD-10-CM

## 2016-09-24 DIAGNOSIS — R2689 Other abnormalities of gait and mobility: Secondary | ICD-10-CM

## 2016-09-24 DIAGNOSIS — E669 Obesity, unspecified: Secondary | ICD-10-CM | POA: Diagnosis not present

## 2016-09-24 DIAGNOSIS — Z9189 Other specified personal risk factors, not elsewhere classified: Secondary | ICD-10-CM | POA: Diagnosis not present

## 2016-09-24 DIAGNOSIS — IMO0001 Reserved for inherently not codable concepts without codable children: Secondary | ICD-10-CM

## 2016-09-24 DIAGNOSIS — R262 Difficulty in walking, not elsewhere classified: Secondary | ICD-10-CM

## 2016-09-24 MED ORDER — VITAMIN D (ERGOCALCIFEROL) 1.25 MG (50000 UNIT) PO CAPS: 50000.0000 [IU] | ORAL_CAPSULE | ORAL | 0 refills | Status: DC

## 2016-09-24 MED ORDER — METFORMIN HCL 500 MG PO TABS: 500.0000 mg | ORAL_TABLET | Freq: Every day | ORAL | 0 refills | Status: DC

## 2016-09-24 MED ORDER — BUPROPION HCL ER (SR) 200 MG PO TB12: 200.0000 mg | ORAL_TABLET | Freq: Every day | ORAL | 0 refills | Status: DC

## 2016-09-24 MED FILL — VIT D2 1.25 MG (50,000 UNIT: 1.25 MG | 28 days supply | Qty: 4 | Fill #0

## 2016-09-24 MED FILL — ATORVASTATIN 80 MG TABLET: 80 | 90 days supply | Qty: 45 | Fill #0

## 2016-09-24 MED FILL — CITALOPRAM HBR 20 MG TABLET: 20 | 90 days supply | Qty: 90 | Fill #1

## 2016-09-24 MED FILL — BUPROPION HCL SR 200 MG TAB: 200 | 30 days supply | Qty: 30 | Fill #0

## 2016-09-24 NOTE — Therapy (Signed)
[facility] [address]  [place], [place], [address] Phone: [phone]   Fax:  [phone]  Physical Therapy Treatment  Patient Details  Name: Monique Gonzalez MRN: [id number] Date of Birth: Jun 08, 1955 Referring Provider: Jean Rosenthal, MD   Encounter Date: 09/24/2016      PT End of Session - 09/24/16 1014    Visit Number 6   Number of Visits 14   Date for PT Re-Evaluation 10/24/16   Authorization Type [facility]   PT Start Time 1014   PT Stop Time 1100   PT Time Calculation (min) 46 min   Activity Tolerance Patient tolerated treatment well   Behavior During Therapy [facility] for tasks assessed/performed      Past Medical History:  Diagnosis Date  . Allergy   . Chronic pain    back and knee  . Colitis 09/2012   infectious vs inflammatory.   . Colon stricture ([place])   . Depression   . Hyperlipidemia   . Obesity   . Osteoarthritis   . Osteopenia   . Recurrent cold sores   . Seasonal allergies   . Syncope and collapse 11/30/2012   Normal EEG.  Vaso vagal syncope    Past Surgical History:  Procedure Laterality Date  . COLON RESECTION N/A 10/14/2013   Procedure: LAPAROSCOPIC MOBILIZATION OF SPLENIC FLEXURE, LAPAROSCOPIC EXTENDED LEFT COLECTOMY;  Surgeon: [name], MD;  Location: [facility];  Service: General;  Laterality: N/A;  . COLONOSCOPY N/A 10/12/2013   Procedure: COLONOSCOPY;  Surgeon: [name], MD;  Location: [facility] ENDOSCOPY;  Service: Endoscopy;  Laterality: N/A;  . DILATION AND CURETTAGE, DIAGNOSTIC / THERAPEUTIC  1987  . OPEN REDUCTION INTERNAL FIXATION (ORIF) DISTAL RADIAL FRACTURE Right 08/08/2014   Procedure: OPEN REDUCTION INTERNAL FIXATION (ORIF) DISTAL RADIAL FRACTURE;  Surgeon: Roseanne Kaufman, MD;  Location: [facility];  Service: Orthopedics;  Laterality: Right;  DVR Crosslock, MD available sometime around 1430-1500  . TONSILLECTOMY    . TOTAL KNEE ARTHROPLASTY Right 08/05/2016  . TOTAL KNEE  ARTHROPLASTY Right 08/05/2016   Procedure: RIGHT TOTAL KNEE ARTHROPLASTY;  Surgeon: Mcarthur Rossetti, MD;  Location: [facility];  Service: Orthopedics;  Laterality: Right;    There were no vitals filed for this visit.      Subjective Assessment - 09/24/16 1016    Subjective Denies pain, just reports stiffness.   Pertinent History R TKR 08/05/16   Patient Stated Goals "Build my strength and endurance."   Currently in Pain? No/denies   Pain Score 0-No pain            [facility] PT Assessment - 09/24/16 1014      AROM   Right Knee Extension 1   Right Knee Flexion 122                     [facility] Adult PT Treatment/Exercise - 09/24/16 1014      Knee/Hip Exercises: Stretches   Gastroc Stretch Right;3 reps;30 seconds   Gastroc Stretch Limitations prostretch     Knee/Hip Exercises: Aerobic   Recumbent Bike L2 x 6'     Knee/Hip Exercises: Machines for Strengthening   Cybex Knee Extension 20# B LE x15; 15# B con/R ecc x15   Cybex Knee Flexion 25# B LE x15; 20# B con/R ecc x15     Knee/Hip Exercises: Standing   Hip Flexion Both;10 reps;Knee straight;Stengthening   Hip Flexion Limitations red TB, 2 pole A   Terminal  Knee Extension Right;15 reps;Theraband   Theraband Level (Terminal Knee Extension) Level 4 (Blue)   Hip ADduction Both;10 reps;Strengthening   Hip ADduction Limitations red TB, 2 pole A   Abduction Limitations red TB, 2 pole A   Hip Extension Both;10 reps;Knee straight;Stengthening   Extension Limitations red TB, 2 pole A   Step Down Right;15 reps;Step Height: 6";Step Height: 4";Hand Hold: 2  2 sets   Step Down Limitations lateral (6") & fwd (4") -eccentric toe touch (poor control with attempt at heel touch)   Functional Squat 15 reps;3 seconds   Functional Squat Limitations TRX      Knee/Hip Exercises: Seated   Other Seated Knee/Hip Exercises R Fitter leg press (1 black/1 blue) x15     Manual Therapy   Manual Therapy Joint mobilization   Joint  Mobilization patellar mobs - all directions - some stiffness sup/inf                 PT Education - 09/24/16 1100    Education provided Yes   Education Details HEP update - 4 way standing hip SLR with red TB   Person(s) Educated Patient   Methods Explanation;Demonstration;Handout   Comprehension Verbalized understanding;Returned demonstration;Need further instruction             PT Long Term Goals - 09/15/16 0805      PT LONG TERM GOAL #1   Title Independent with ongoing HEP by 10/24/16   Status On-going     PT LONG TERM GOAL #2   Title R knee AROM >/= 3-115 dg to allow for normal gait/stair mechanics by 10/24/16   Status On-going     PT LONG TERM GOAL #3   Title R hip and knee strength >/= 4/5 for improved stability by 10/24/16   Status On-going     PT LONG TERM GOAL #4   Title Pt will ambulate with normal gait pattern and even weight shift by 10/24/16   Status On-going     PT LONG TERM GOAL #5   Title Pt will ascend/descend stairs with reciprocal pattern by 10/24/16   Status On-going               Plan - 09/24/16 1017    Clinical Impression Statement Roby is demonstrating good progress with R knee AROM, now 1-122 dg. Scar tissue mobility improving but patellar mobility still slightly limited in superior/inferior directions. Strength improving but remains most limited with eccentric quad activities and SLS stability, therefore will increase focus on proprioceptive training in upcoming visits.   Rehab Potential Good   Clinical Impairments Affecting Rehab Potential obesity   PT Treatment/Interventions Patient/family education;ADLs/Self Care Home Management;Therapeutic exercise;Therapeutic activities;Functional mobility training;Gait training;Stair training;Neuromuscular re-education;Balance training;Manual techniques;Scar mobilization;Passive range of motion;Taping;Electrical Stimulation;Cryotherapy;Vasopneumatic Device   PT Next Visit Plan R knee ROM; B LE  strengthening - progressing resistance and CKC; proprioceptive training; manual therapy as indicated for increased joint mobility & ROM; modalities PRN   Consulted and Agree with Plan of Care Patient      Patient will benefit from skilled therapeutic intervention in order to improve the following deficits and impairments:  Decreased range of motion, Impaired flexibility, Decreased strength, Difficulty walking, Abnormal gait, Decreased activity tolerance, Decreased endurance, Decreased balance, Decreased scar mobility, Pain  Visit Diagnosis: Stiffness of right knee, not elsewhere classified  Muscle weakness (generalized)  Difficulty in walking, not elsewhere classified  Other abnormalities of gait and mobility  Acute pain of right knee     Problem List  Patient Active Problem List   Diagnosis Date Noted  . Status post total right knee replacement 08/05/2016  . Class 2 obesity without serious comorbidity with body mass index (BMI) of 35.0 to 35.9 in adult 07/23/2016  . Unilateral primary osteoarthritis, right knee 05/20/2016  . Obesity (BMI 35.0-39.9 without comorbidity) 05/06/2016  . Class 2 obesity without serious comorbidity with body mass index (BMI) of 36.0 to 36.9 in adult 04/22/2016  . Class 2 obesity without serious comorbidity with body mass index (BMI) of 37.0 to 37.9 in adult 04/08/2016  . Vitamin D deficiency 04/08/2016  . Shortness of breath 03/25/2016  . Fracture, radius 08/08/2014  . Constipation 09/20/2013  . Syncope and collapse 11/30/2012  . Recurrent cold sores   . Right knee pain 11/23/2011  . Osteoarthritis   . Depression   . Hyperlipidemia     Percival Spanish, PT, MPT 09/24/2016, 12:26 PM  [facility] [address]  [place], [place], [address] Phone: [phone]   Fax:  [phone]  Name: Monique Gonzalez MRN: [id number] Date of Birth: [birth year]

## 2016-09-25 NOTE — Progress Notes (Signed)
Office: [phone]  /  Fax: [phone]   HPI:   Chief Complaint: OBESITY Monique Gonzalez is here to discuss her progress with her obesity treatment plan. She is on the  follow the Category 2 plan and is following her eating plan approximately 0 % of the time. She states she is exercising physical therapy for 45 minutes 3 times per week. Monique Gonzalez is struggling with excessive hunger since knee surgery, she was able to lose weight but feels she is suffering more and would like to discuss medications which could help. Her weight is 260 lb (117.9 kg) today and has had a weight loss of 1 pound over a period of 3 weeks since her last visit. She has lost 17 lbs since starting treatment with [name].  Insulin Resistance Monique Gonzalez has a diagnosis of insulin resistance. She notes increased polyphagia since having knee surgery which may be due to increased stress with being post op. She is not taking metformin currently and continues to work on diet and exercise to decrease risk of diabetes.  At risk for diabetes Monique Gonzalez is at higher than average risk for developing diabetes due to her obesity and insulin resistance. She currently denies polyuria or polydipsia.  Depression with emotional eating behaviors Monique Gonzalez's mood is stable, some emotional eating is still present. Monique Gonzalez struggles with emotional eating and using food for comfort to the extent that it is negatively impacting her health. She often snacks when she is not hungry. Monique Gonzalez sometimes feels she is out of control and then feels guilty that she made poor food choices. She has been working on behavior modification techniques to help reduce her emotional eating and has been somewhat successful. She shows no sign of suicidal or homicidal ideations.  Vitamin D deficiency Monique Gonzalez has a diagnosis of vitamin D deficiency. She is currently stable on vit D and denies nausea, vomiting or muscle weakness.  Depression screen Monique Gonzalez  Decreased Interest 3 0  Down,  Depressed, Hopeless 3 0  PHQ - 2 Score 6 0  Altered sleeping 3 -  Tired, decreased energy 3 -  Change in appetite 3 -  Feeling bad or failure about yourself  3 -  Trouble concentrating 3 -  Moving slowly or fidgety/restless 3 -  Suicidal thoughts 0 -  PHQ-9 Score 24 -      ALLERGIES: Allergies  Allergen Reactions  . Penicillins Rash and Other (See Comments)    Has patient had a PCN reaction causing immediate rash, facial/tongue/throat swelling, SOB or lightheadedness with hypotension: Unknown Has patient had a PCN reaction causing severe rash involving mucus membranes or skin necrosis: Unknown Has patient had a PCN reaction that required hospitalization: No Has patient had a PCN reaction occurring within the last 10 years: No If all of the above answers are "NO", then may proceed with Cephalosporin use. Childhood reaction.  . Sulfa Antibiotics Rash    MEDICATIONS: Current Outpatient Prescriptions on File Prior to Visit  Medication Sig Dispense Refill  . acetaminophen (TYLENOL) 650 MG CR tablet Take 1,300 mg by mouth at bedtime.    Marland Kitchen acyclovir cream (ZOVIRAX) 5 % Apply 1 application topically every 3 (three) hours. (Patient taking differently: Apply 1 application topically every 3 (three) hours as needed (for cold sores). ) 15 g 3  . aspirin EC 325 MG EC tablet Take 1 tablet (325 mg total) by mouth 2 (two) times daily after a meal. 30 tablet 0  . atorvastatin (LIPITOR) 40 MG tablet Take 1  tablet (40 mg total) by mouth daily. (Patient taking differently: Take 40 mg by mouth at bedtime. ) 90 tablet 3  . cetirizine (ZYRTEC) 10 MG tablet Take 10 mg by mouth at bedtime.    . citalopram (CELEXA) 20 MG tablet Take 1 tablet (20 mg total) by mouth daily. (Patient taking differently: Take 20 mg by mouth at bedtime. ) 90 tablet 3  . cyclobenzaprine (FLEXERIL) 10 MG tablet Take 1 tablet (10 mg total) by mouth 3 (three) times daily as needed for muscle spasms. 60 tablet 0  . fluticasone  (FLONASE) 50 MCG/ACT nasal spray Place 2 sprays into both nostrils daily. (Patient taking differently: Place 2 sprays into both nostrils at bedtime. ) 16 g 6  . loratadine (CLARITIN) 10 MG tablet Take 10 mg by mouth at bedtime.     . Multiple Vitamins-Minerals (MULTIVITAMIN,TX-MINERALS) tablet Take 1 tablet by mouth daily. POWDER MIX    . oxyCODONE-acetaminophen (ROXICET) 5-325 MG tablet Take 1-2 tablets by mouth every 6 (six) hours as needed. 60 tablet 0  . valACYclovir (VALTREX) 1000 MG tablet TAKE 2 TABLETS BY MOUTH EVERY 12 HOURS FOR 2 DOSES AS NEEDED FOR FLARE (Patient taking differently: Take 1,000 mg by mouth every 12 (twelve) hours as needed (for cold sores.). ) 30 tablet 11   No current facility-administered medications on file prior to visit.     PAST MEDICAL HISTORY: Past Medical History:  Diagnosis Date  . Allergy   . Chronic pain    back and knee  . Colitis 09/2012   infectious vs inflammatory.   . Colon stricture ([place])   . Depression   . Hyperlipidemia   . Obesity   . Osteoarthritis   . Osteopenia   . Recurrent cold sores   . Seasonal allergies   . Syncope and collapse 11/30/2012   Normal EEG.  Vaso vagal syncope    PAST SURGICAL HISTORY: Past Surgical History:  Procedure Laterality Date  . COLON RESECTION N/A 10/14/2013   Procedure: LAPAROSCOPIC MOBILIZATION OF SPLENIC FLEXURE, LAPAROSCOPIC EXTENDED LEFT COLECTOMY;  Surgeon: [name], MD;  Location: [facility];  Service: General;  Laterality: N/A;  . COLONOSCOPY N/A 10/12/2013   Procedure: COLONOSCOPY;  Surgeon: [name], MD;  Location: [facility] ENDOSCOPY;  Service: Endoscopy;  Laterality: N/A;  . DILATION AND CURETTAGE, DIAGNOSTIC / THERAPEUTIC  1987  . OPEN REDUCTION INTERNAL FIXATION (ORIF) DISTAL RADIAL FRACTURE Right 08/08/2014   Procedure: OPEN REDUCTION INTERNAL FIXATION (ORIF) DISTAL RADIAL FRACTURE;  Surgeon: Roseanne Kaufman, MD;  Location: [facility];  Service: Orthopedics;  Laterality: Right;  DVR Crosslock, MD  available sometime around 1430-1500  . TONSILLECTOMY    . TOTAL KNEE ARTHROPLASTY Right 08/05/2016  . TOTAL KNEE ARTHROPLASTY Right 08/05/2016   Procedure: RIGHT TOTAL KNEE ARTHROPLASTY;  Surgeon: Mcarthur Rossetti, MD;  Location: [facility];  Service: Orthopedics;  Laterality: Right;    SOCIAL HISTORY: Social History  Substance Use Topics  . Smoking status: Former Smoker    Quit date: 10/27/1993  . Smokeless tobacco: Never Used  . Alcohol use Yes     Comment: one drink per week    FAMILY HISTORY: Family History  Problem Relation Age of Onset  . Stroke Mother   . Arthritis Mother   . Thyroid disease Mother   . Cancer Father   . Arthritis Sister   . Thyroid disease Sister   . Cancer Maternal Grandmother   . Stroke Maternal Grandfather   . Cancer Maternal Grandfather   .  Colon cancer Maternal Grandfather   . Cancer Paternal Grandmother   . Heart disease Paternal Grandmother   . Colon cancer Paternal Grandmother   . Stroke Paternal Grandfather   . Arthritis Sister   . Obesity Sister   . Sudden death Neg Hx   . Hypertension Neg Hx   . Hyperlipidemia Neg Hx   . Heart attack Neg Hx   . Diabetes Neg Hx   . Rectal cancer Neg Hx   . Stomach cancer Neg Hx   . Esophageal cancer Neg Hx     ROS: Review of Systems  Constitutional: Positive for weight loss.  Gastrointestinal: Negative for nausea and vomiting.  Genitourinary: Negative for frequency.  Musculoskeletal:       Negative muscle weakness  Endo/Heme/Allergies: Negative for polydipsia.       Polyphagia  Psychiatric/Behavioral: Positive for depression. Negative for suicidal ideas.       Stress    PHYSICAL EXAM: Blood pressure 111/74, pulse 82, temperature 97.9 F (36.6 C), temperature source Oral, height 6' (1.829 m), weight 260 lb (117.9 kg), SpO2 97 %. Body mass index is 35.26 kg/m. Physical Exam  Constitutional: She is oriented to person, place, and time. She appears well-developed and well-nourished.    Cardiovascular: Normal rate.   Pulmonary/Chest: Effort normal.  Musculoskeletal: Normal range of motion.  Neurological: She is oriented to person, place, and time.  Skin: Skin is warm and dry.  Psychiatric: She has a normal mood and affect. Her behavior is normal.  Vitals reviewed.   RECENT LABS AND TESTS: BMET    Component Value Date/Time   NA 136 08/06/2016 0537   NA 142 03/25/2016 1341   K 4.6 08/06/2016 0537   CL 107 08/06/2016 0537   CO2 25 08/06/2016 0537   GLUCOSE 157 (H) 08/06/2016 0537   BUN 20 08/06/2016 0537   BUN 16 03/25/2016 1341   CREATININE 0.87 08/06/2016 0537   CREATININE 0.82 03/20/2015 1420   CALCIUM 8.4 (L) 08/06/2016 0537   GFRNONAA >60 08/06/2016 0537   GFRNONAA 77 03/20/2015 1420   GFRAA >60 08/06/2016 0537   GFRAA 89 03/20/2015 1420   Lab Results  Component Value Date   HGBA1C 5.3 03/25/2016   HGBA1C (H) 09/11/2009    5.7 (NOTE)                                                                       According to the ADA Clinical Practice Recommendations for 2011, when HbA1c is used as a screening test:   >=6.5%   Diagnostic of Diabetes Mellitus           (if abnormal result  is confirmed)  5.7-6.4%   Increased risk of developing Diabetes Mellitus  References:Diagnosis and Classification of Diabetes Mellitus,Diabetes IWLN,9892,11(HERDE 1):S62-S69 and Standards of Medical Care in         Diabetes - 2011,Diabetes Care,2011,34  (Suppl 1):S11-S61.   Lab Results  Component Value Date   INSULIN 5.5 03/25/2016   CBC    Component Value Date/Time   WBC 9.9 08/06/2016 0641   RBC 3.49 (L) 08/06/2016 0641   HGB 10.0 (L) 08/06/2016 0641   HGB 12.5 03/25/2016 1341   HCT 31.3 (L) 08/06/2016 0641   HCT 37.2  03/25/2016 1341   PLT 189 08/06/2016 0641   MCV 89.7 08/06/2016 0641   MCV 88 03/25/2016 1341   MCH 28.7 08/06/2016 0641   MCHC 31.9 08/06/2016 0641   RDW 13.5 08/06/2016 0641   RDW 14.0 03/25/2016 1341   LYMPHSABS 1.2 03/25/2016 1341   MONOABS  0.5 03/20/2015 1420   EOSABS 0.0 03/25/2016 1341   BASOSABS 0.0 03/25/2016 1341   Iron/TIBC/Ferritin/ %Sat No results found for: IRON, TIBC, FERRITIN, IRONPCTSAT Lipid Panel     Component Value Date/Time   CHOL 172 03/25/2016 1341   TRIG 145 03/25/2016 1341   HDL 59 03/25/2016 1341   CHOLHDL 5.0 03/20/2015 1420   VLDL 35 (H) 03/20/2015 1420   LDLCALC 84 03/25/2016 1341   Hepatic Function Panel     Component Value Date/Time   PROT 6.5 03/25/2016 1341   ALBUMIN 4.2 03/25/2016 1341   AST 16 03/25/2016 1341   ALT 14 03/25/2016 1341   ALKPHOS 72 03/25/2016 1341   BILITOT 0.4 03/25/2016 1341      Component Value Date/Time   TSH 1.420 03/25/2016 1341   TSH 1.530 03/20/2015 1420   TSH 2.427 12/04/2014 0908    ASSESSMENT AND PLAN: Insulin resistance - Plan: metFORMIN (GLUCOPHAGE) 500 MG tablet  Other depression - Plan: buPROPion (WELLBUTRIN SR) 200 MG 12 hr tablet  Vitamin D deficiency - Plan: Vitamin D, Ergocalciferol, (DRISDOL) 50000 units CAPS capsule  At risk for diabetes mellitus  Class 2 obesity with serious comorbidity and body mass index (BMI) of 35.0 to 35.9 in adult, unspecified obesity type  PLAN:  Insulin Resistance Monique Gonzalez will continue to work on weight loss, exercise, and decreasing simple carbohydrates in her diet to help decrease the risk of diabetes. We dicussed metformin including benefits and risks. She was informed that eating too many simple carbohydrates or too many calories at one sitting increases the likelihood of GI side effects. Vicci agrees to start metformin for now and prescription was written today for metformin 500 mg every morning #30 with no refills. Monique Gonzalez agreed to follow up with [name] as directed to monitor her progress.  Diabetes risk counselling Monique Gonzalez was given extended (15 minutes) diabetes prevention counseling today. She is 61 y.o. female and has risk factors for diabetes including obesity and insulin resistance. We discussed intensive lifestyle  modifications today with an emphasis on weight loss as well as increasing exercise and decreasing simple carbohydrates in her diet.  Depression with Emotional Eating Behaviors We discussed behavior modification techniques today to help Monique Gonzalez deal with her emotional eating and depression. She has agreed to continue  Wellbutrin SR 200 mg qd, we will refill for 1 month and will follow up as directed.  Vitamin D Deficiency Monique Gonzalez was informed that low vitamin D levels contributes to fatigue and are associated with obesity, breast, and colon cancer. She agrees to continue to take prescription Vit D @50 ,000 IU every week, we will refill for 1 month and will follow up for routine testing of vitamin D, at least 2-3 times per year. She was informed of the risk of over-replacement of vitamin D and agrees to not increase her dose unless he discusses this with [name] first. Monique Gonzalez agrees to follow up with our clinic in 2 weeks.  Obesity Monique Gonzalez is currently in the action stage of change. As such, her goal is to continue with weight loss efforts She has agreed to follow the Category 2 plan Elle has been instructed to work up to a goal of  150 minutes of combined cardio and strengthening exercise per week for weight loss and overall health benefits. We discussed the following Behavioral Modification Strategies today: increasing lean protein intake, meal planning & cooking strategies and keeping healthy foods in the home  Monique Gonzalez has agreed to follow up with our clinic in 2 weeks. She was informed of the importance of frequent follow up visits to maximize her success with intensive lifestyle modifications for her multiple health conditions.  I, Doreene Nest, am acting as transcriptionist for Dennard Nip, MD  I have reviewed the above documentation for accuracy and completeness, and I agree with the above. -Dennard Nip, MD   OBESITY BEHAVIORAL INTERVENTION VISIT  Today's visit was # 11 out of 22.  Starting weight: 277  lbs Starting date: 03/25/16 Today's weight : 260 lbs Today's date: 09/24/2016 Total lbs lost to date: 17 (Patients must lose 7 lbs in the first 6 months to continue with counseling)   ASK: We discussed the diagnosis of obesity with Monique Gonzalez today and Monique Gonzalez agreed to give [name] permission to discuss obesity behavioral modification therapy today.  ASSESS: Samauri has the diagnosis of obesity and her BMI today is 35.3 Shaneal is in the action stage of change   ADVISE: Doshie was educated on the multiple health risks of obesity as well as the benefit of weight loss to improve her health. She was advised of the need for long term treatment and the importance of lifestyle modifications.  AGREE: Multiple dietary modification options and treatment options were discussed and  Najiyah agreed to follow the Category 2 plan We discussed the following Behavioral Modification Strategies today: increasing lean protein intake, meal planning & cooking strategies and keeping healthy foods in the home.

## 2016-09-26 ENCOUNTER — Ambulatory Visit: Payer: 59 | Admitting: Physical Therapy

## 2016-09-26 DIAGNOSIS — M6281 Muscle weakness (generalized): Secondary | ICD-10-CM | POA: Diagnosis not present

## 2016-09-26 DIAGNOSIS — R262 Difficulty in walking, not elsewhere classified: Secondary | ICD-10-CM | POA: Diagnosis not present

## 2016-09-26 DIAGNOSIS — M25661 Stiffness of right knee, not elsewhere classified: Secondary | ICD-10-CM

## 2016-09-26 DIAGNOSIS — R2689 Other abnormalities of gait and mobility: Secondary | ICD-10-CM | POA: Diagnosis not present

## 2016-09-26 DIAGNOSIS — M25561 Pain in right knee: Secondary | ICD-10-CM

## 2016-09-26 NOTE — Therapy (Signed)
[facility] [address]  [place], [place], [address] Phone: [phone]   Fax:  [phone]  Physical Therapy Treatment  Patient Details  Name: Monique Gonzalez MRN: [id number] Date of Birth: Jun 19, 1955 Referring Provider: Jean Rosenthal, MD   Encounter Date: 09/26/2016      PT End of Session - 09/26/16 1105    Visit Number 7   Number of Visits 14   Date for PT Re-Evaluation 10/24/16   Authorization Type [facility]   PT Start Time 1101   PT Stop Time 1141   PT Time Calculation (min) 40 min   Activity Tolerance Patient tolerated treatment well   Behavior During Therapy [facility] for tasks assessed/performed      Past Medical History:  Diagnosis Date  . Allergy   . Chronic pain    back and knee  . Colitis 09/2012   infectious vs inflammatory.   . Colon stricture ([place])   . Depression   . Hyperlipidemia   . Obesity   . Osteoarthritis   . Osteopenia   . Recurrent cold sores   . Seasonal allergies   . Syncope and collapse 11/30/2012   Normal EEG.  Vaso vagal syncope    Past Surgical History:  Procedure Laterality Date  . COLON RESECTION N/A 10/14/2013   Procedure: LAPAROSCOPIC MOBILIZATION OF SPLENIC FLEXURE, LAPAROSCOPIC EXTENDED LEFT COLECTOMY;  Surgeon: [name], MD;  Location: [place];  Service: General;  Laterality: N/A;  . COLONOSCOPY N/A 10/12/2013   Procedure: COLONOSCOPY;  Surgeon: [name], MD;  Location: [facility] ENDOSCOPY;  Service: Endoscopy;  Laterality: N/A;  . DILATION AND CURETTAGE, DIAGNOSTIC / THERAPEUTIC  1987  . OPEN REDUCTION INTERNAL FIXATION (ORIF) DISTAL RADIAL FRACTURE Right 08/08/2014   Procedure: OPEN REDUCTION INTERNAL FIXATION (ORIF) DISTAL RADIAL FRACTURE;  Surgeon: Roseanne Kaufman, MD;  Location: [place];  Service: Orthopedics;  Laterality: Right;  DVR Crosslock, MD available sometime around 1430-1500  . TONSILLECTOMY    . TOTAL KNEE ARTHROPLASTY Right 08/05/2016  . TOTAL KNEE  ARTHROPLASTY Right 08/05/2016   Procedure: RIGHT TOTAL KNEE ARTHROPLASTY;  Surgeon: Mcarthur Rossetti, MD;  Location: [place];  Service: Orthopedics;  Laterality: Right;    There were no vitals filed for this visit.      Subjective Assessment - 09/26/16 1103    Subjective feeling well - no new complaints   Pertinent History R TKR 08/05/16   Patient Stated Goals "Build my strength and endurance."   Currently in Pain? No/denies   Pain Score 0-No pain                         [facility] Adult PT Treatment/Exercise - 09/26/16 1107      Knee/Hip Exercises: Aerobic   Nustep L6 x 6 minutes     Knee/Hip Exercises: Machines for Strengthening   Cybex Knee Extension 20# B LE x15; 15# B con/R ecc x15   Cybex Knee Flexion 25# B LE x15; 20# B con/R ecc x15     Knee/Hip Exercises: Standing   Step Down Right;15 reps;Hand Hold: 2;Step Height: 6"   Step Down Limitations lateral - most difficulty with return to upright stance     Knee/Hip Exercises: Supine   Straight Leg Raises Strengthening;Right;15 reps   Straight Leg Raises Limitations 4#   Straight Leg Raise with External Rotation Strengthening;Right;15 reps   Straight Leg Raise with External Rotation Limitations 2#   Other Supine Knee/Hip  Exercises straight leg bridge - little height x 15 reps     Knee/Hip Exercises: Sidelying   Hip ABduction Right;15 reps   Hip ABduction Limitations 4#     Manual Therapy   Manual Therapy Joint mobilization;Soft tissue mobilization   Joint Mobilization patellar mobs - all directions    Soft tissue mobilization STM to posterior medial hamstrings                     PT Long Term Goals - 09/15/16 0805      PT LONG TERM GOAL #1   Title Independent with ongoing HEP by 10/24/16   Status On-going     PT LONG TERM GOAL #2   Title R knee AROM >/= 3-115 dg to allow for normal gait/stair mechanics by 10/24/16   Status On-going     PT LONG TERM GOAL #3   Title R hip and knee  strength >/= 4/5 for improved stability by 10/24/16   Status On-going     PT LONG TERM GOAL #4   Title Pt will ambulate with normal gait pattern and even weight shift by 10/24/16   Status On-going     PT LONG TERM GOAL #5   Title Pt will ascend/descend stairs with reciprocal pattern by 10/24/16   Status On-going               Plan - 09/26/16 1105    Clinical Impression Statement Monique Gonzalez doing well with all strengthening progressions today. Does have some continued stiffness with sup/inf patellar mobility with good repsonse to manaul therapy. WIll continue to progress end range quad strength and eccentric quad control at upcoming visits.    PT Treatment/Interventions Patient/family education;ADLs/Self Care Home Management;Therapeutic exercise;Therapeutic activities;Functional mobility training;Gait training;Stair training;Neuromuscular re-education;Balance training;Manual techniques;Scar mobilization;Passive range of motion;Taping;Electrical Stimulation;Cryotherapy;Vasopneumatic Device   PT Next Visit Plan R knee ROM; B LE strengthening - progressing resistance and CKC; proprioceptive training; manual therapy as indicated for increased joint mobility & ROM; modalities PRN   Consulted and Agree with Plan of Care Patient      Patient will benefit from skilled therapeutic intervention in order to improve the following deficits and impairments:  Decreased range of motion, Impaired flexibility, Decreased strength, Difficulty walking, Abnormal gait, Decreased activity tolerance, Decreased endurance, Decreased balance, Decreased scar mobility, Pain  Visit Diagnosis: Stiffness of right knee, not elsewhere classified  Muscle weakness (generalized)  Difficulty in walking, not elsewhere classified  Other abnormalities of gait and mobility  Acute pain of right knee     Problem List Patient Active Problem List   Diagnosis Date Noted  . Status post total right knee replacement 08/05/2016  .  Class 2 obesity without serious comorbidity with body mass index (BMI) of 35.0 to 35.9 in adult 07/23/2016  . Unilateral primary osteoarthritis, right knee 05/20/2016  . Obesity (BMI 35.0-39.9 without comorbidity) 05/06/2016  . Class 2 obesity without serious comorbidity with body mass index (BMI) of 36.0 to 36.9 in adult 04/22/2016  . Class 2 obesity without serious comorbidity with body mass index (BMI) of 37.0 to 37.9 in adult 04/08/2016  . Vitamin D deficiency 04/08/2016  . Shortness of breath 03/25/2016  . Fracture, radius 08/08/2014  . Constipation 09/20/2013  . Syncope and collapse 11/30/2012  . Recurrent cold sores   . Right knee pain 11/23/2011  . Osteoarthritis   . Depression   . Hyperlipidemia      Lanney Gins, PT, DPT 09/26/16 11:49 AM   [facility] [address]  [place], [place], [address] Phone: [phone]   Fax:  [phone]  Name: Monique Gonzalez MRN: [id number] Date of Birth: Sep 09, 1955

## 2016-09-30 ENCOUNTER — Ambulatory Visit: Payer: 59 | Admitting: Physical Therapy

## 2016-09-30 DIAGNOSIS — R2689 Other abnormalities of gait and mobility: Secondary | ICD-10-CM | POA: Diagnosis not present

## 2016-09-30 DIAGNOSIS — R262 Difficulty in walking, not elsewhere classified: Secondary | ICD-10-CM | POA: Diagnosis not present

## 2016-09-30 DIAGNOSIS — M25561 Pain in right knee: Secondary | ICD-10-CM | POA: Diagnosis not present

## 2016-09-30 DIAGNOSIS — M25661 Stiffness of right knee, not elsewhere classified: Secondary | ICD-10-CM

## 2016-09-30 DIAGNOSIS — M6281 Muscle weakness (generalized): Secondary | ICD-10-CM

## 2016-09-30 NOTE — Therapy (Signed)
[facility] [address]  [place], [place], [address] Phone: [phone]   Fax:  [phone]  Physical Therapy Treatment  Patient Details  Name: MARGARUITE TOP MRN: [id number] Date of Birth: 01/30/1956 Referring Provider: Jean Rosenthal, MD   Encounter Date: 09/30/2016      PT End of Session - 09/30/16 0931    Visit Number 8   Number of Visits 14   Date for PT Re-Evaluation 10/24/16   Authorization Type [facility]   PT Start Time 0930   PT Stop Time 1012   PT Time Calculation (min) 42 min   Activity Tolerance Patient tolerated treatment well   Behavior During Therapy [facility] for tasks assessed/performed      Past Medical History:  Diagnosis Date  . Allergy   . Chronic pain    back and knee  . Colitis 09/2012   infectious vs inflammatory.   . Colon stricture ([place])   . Depression   . Hyperlipidemia   . Obesity   . Osteoarthritis   . Osteopenia   . Recurrent cold sores   . Seasonal allergies   . Syncope and collapse 11/30/2012   Normal EEG.  Vaso vagal syncope    Past Surgical History:  Procedure Laterality Date  . COLON RESECTION N/A 10/14/2013   Procedure: LAPAROSCOPIC MOBILIZATION OF SPLENIC FLEXURE, LAPAROSCOPIC EXTENDED LEFT COLECTOMY;  Surgeon: [name], MD;  Location: [place];  Service: General;  Laterality: N/A;  . COLONOSCOPY N/A 10/12/2013   Procedure: COLONOSCOPY;  Surgeon: [name], MD;  Location: [facility] ENDOSCOPY;  Service: Endoscopy;  Laterality: N/A;  . DILATION AND CURETTAGE, DIAGNOSTIC / THERAPEUTIC  1987  . OPEN REDUCTION INTERNAL FIXATION (ORIF) DISTAL RADIAL FRACTURE Right 08/08/2014   Procedure: OPEN REDUCTION INTERNAL FIXATION (ORIF) DISTAL RADIAL FRACTURE;  Surgeon: Roseanne Kaufman, MD;  Location: [place];  Service: Orthopedics;  Laterality: Right;  DVR Crosslock, MD available sometime around 1430-1500  . TONSILLECTOMY    . TOTAL KNEE ARTHROPLASTY Right 08/05/2016  . TOTAL KNEE  ARTHROPLASTY Right 08/05/2016   Procedure: RIGHT TOTAL KNEE ARTHROPLASTY;  Surgeon: Mcarthur Rossetti, MD;  Location: [place];  Service: Orthopedics;  Laterality: Right;    There were no vitals filed for this visit.      Subjective Assessment - 09/30/16 0931    Subjective has been using recumbent elliptical at home; has been trying to do stairs "normal"   Pertinent History R TKR 08/05/16   Patient Stated Goals "Build my strength and endurance."   Currently in Pain? No/denies   Pain Score 0-No pain                         [facility] Adult PT Treatment/Exercise - 09/30/16 0932      Knee/Hip Exercises: Stretches   Passive Hamstring Stretch Right;3 reps;30 seconds   Passive Hamstring Stretch Limitations seated with R LE extended     Knee/Hip Exercises: Aerobic   Recumbent Bike L3 x 6 minutes     Knee/Hip Exercises: Machines for Strengthening   Cybex Knee Extension 25# B LE 2 x 15   Cybex Knee Flexion 25# B LE 2 x 15     Knee/Hip Exercises: Standing   Hip Flexion Right;15 reps;Knee straight   Hip Flexion Limitations green TB, 2 pole A   Hip ADduction Right;15 reps   Hip ADduction Limitations green TB, 2 pole A   Hip Abduction Right;15 reps;Knee straight  Abduction Limitations green TB, 2 pole A   Hip Extension Right;15 reps;Knee straight   Extension Limitations green TB, 2 pole A   Forward Step Up Right;15 reps;Hand Hold: 2;Step Height: 8"   Forward Step Up Limitations 4# at ankle   Step Down Right;15 reps;Hand Hold: 2;Step Height: 8"   Step Down Limitations forward step down to simulate stairs   Functional Squat 15 reps;3 seconds   Functional Squat Limitations TRX      Knee/Hip Exercises: Seated   Long Arc Quad Right;15 reps;Strengthening   Long Arc Quad Weight 4 lbs.   Long [employer] Limitations 3-5 sec hold; with ball squeeze   Other Seated Knee/Hip Exercises R Fitter leg press (1 black/2 blue) x15                     PT Long Term Goals -  09/15/16 0805      PT LONG TERM GOAL #1   Title Independent with ongoing HEP by 10/24/16   Status On-going     PT LONG TERM GOAL #2   Title R knee AROM >/= 3-115 dg to allow for normal gait/stair mechanics by 10/24/16   Status On-going     PT LONG TERM GOAL #3   Title R hip and knee strength >/= 4/5 for improved stability by 10/24/16   Status On-going     PT LONG TERM GOAL #4   Title Pt will ambulate with normal gait pattern and even weight shift by 10/24/16   Status On-going     PT LONG TERM GOAL #5   Title Pt will ascend/descend stairs with reciprocal pattern by 10/24/16   Status On-going               Plan - 09/30/16 0932    Clinical Impression Statement Patrica doing well today wiht all strengthening tasks. Some difficulty with forward step up and downs likely due to reduced end range quad strength needed for good eccentric control. Patient with reports of excellent HEP complaince thus far, likely contributing to overall success of treatment. Will continue to progress as tolerated.    PT Treatment/Interventions Patient/family education;ADLs/Self Care Home Management;Therapeutic exercise;Therapeutic activities;Functional mobility training;Gait training;Stair training;Neuromuscular re-education;Balance training;Manual techniques;Scar mobilization;Passive range of motion;Taping;Electrical Stimulation;Cryotherapy;Vasopneumatic Device   PT Next Visit Plan R knee ROM; B LE strengthening - progressing resistance and CKC; proprioceptive training; manual therapy as indicated for increased joint mobility & ROM; modalities PRN   Consulted and Agree with Plan of Care Patient      Patient will benefit from skilled therapeutic intervention in order to improve the following deficits and impairments:  Decreased range of motion, Impaired flexibility, Decreased strength, Difficulty walking, Abnormal gait, Decreased activity tolerance, Decreased endurance, Decreased balance, Decreased scar mobility,  Pain  Visit Diagnosis: Stiffness of right knee, not elsewhere classified  Muscle weakness (generalized)  Difficulty in walking, not elsewhere classified  Other abnormalities of gait and mobility  Acute pain of right knee     Problem List Patient Active Problem List   Diagnosis Date Noted  . Status post total right knee replacement 08/05/2016  . Class 2 obesity without serious comorbidity with body mass index (BMI) of 35.0 to 35.9 in adult 07/23/2016  . Unilateral primary osteoarthritis, right knee 05/20/2016  . Obesity (BMI 35.0-39.9 without comorbidity) 05/06/2016  . Class 2 obesity without serious comorbidity with body mass index (BMI) of 36.0 to 36.9 in adult 04/22/2016  . Class 2 obesity without serious comorbidity with body  mass index (BMI) of 37.0 to 37.9 in adult 04/08/2016  . Vitamin D deficiency 04/08/2016  . Shortness of breath 03/25/2016  . Fracture, radius 08/08/2014  . Constipation 09/20/2013  . Syncope and collapse 11/30/2012  . Recurrent cold sores   . Right knee pain 11/23/2011  . Osteoarthritis   . Depression   . Hyperlipidemia      Lanney Gins, PT, DPT 09/30/16 10:12 AM   [facility] [address]  [place], [place], [address] Phone: [phone]   Fax:  [phone]  Name: TIFFNEY HAUGHTON MRN: [id number] Date of Birth: November 11, 1955

## 2016-10-02 ENCOUNTER — Ambulatory Visit: Payer: 59 | Admitting: Physical Therapy

## 2016-10-02 DIAGNOSIS — M25661 Stiffness of right knee, not elsewhere classified: Secondary | ICD-10-CM

## 2016-10-02 DIAGNOSIS — R262 Difficulty in walking, not elsewhere classified: Secondary | ICD-10-CM | POA: Diagnosis not present

## 2016-10-02 DIAGNOSIS — M25561 Pain in right knee: Secondary | ICD-10-CM

## 2016-10-02 DIAGNOSIS — M6281 Muscle weakness (generalized): Secondary | ICD-10-CM | POA: Diagnosis not present

## 2016-10-02 DIAGNOSIS — R2689 Other abnormalities of gait and mobility: Secondary | ICD-10-CM | POA: Diagnosis not present

## 2016-10-02 NOTE — Therapy (Signed)
[facility] [address]  [place], [place], [address] Phone: [phone]   Fax:  [phone]  Physical Therapy Treatment  Patient Details  Name: Monique Gonzalez MRN: [id number] Date of Birth: June 04, 1955 Referring Provider: Jean Rosenthal, MD   Encounter Date: 10/02/2016      PT End of Session - 10/02/16 0847    Visit Number 9   Number of Visits 14   Date for PT Re-Evaluation 10/24/16   Authorization Type [facility]   PT Start Time 0277   PT Stop Time 0929   PT Time Calculation (min) 42 min   Activity Tolerance Patient tolerated treatment well   Behavior During Therapy [facility] for tasks assessed/performed      Past Medical History:  Diagnosis Date  . Allergy   . Chronic pain    back and knee  . Colitis 09/2012   infectious vs inflammatory.   . Colon stricture ([place])   . Depression   . Hyperlipidemia   . Obesity   . Osteoarthritis   . Osteopenia   . Recurrent cold sores   . Seasonal allergies   . Syncope and collapse 11/30/2012   Normal EEG.  Vaso vagal syncope    Past Surgical History:  Procedure Laterality Date  . COLON RESECTION N/A 10/14/2013   Procedure: LAPAROSCOPIC MOBILIZATION OF SPLENIC FLEXURE, LAPAROSCOPIC EXTENDED LEFT COLECTOMY;  Surgeon: [name], MD;  Location: [place];  Service: General;  Laterality: N/A;  . COLONOSCOPY N/A 10/12/2013   Procedure: COLONOSCOPY;  Surgeon: [name], MD;  Location: [facility] ENDOSCOPY;  Service: Endoscopy;  Laterality: N/A;  . DILATION AND CURETTAGE, DIAGNOSTIC / THERAPEUTIC  1987  . OPEN REDUCTION INTERNAL FIXATION (ORIF) DISTAL RADIAL FRACTURE Right 08/08/2014   Procedure: OPEN REDUCTION INTERNAL FIXATION (ORIF) DISTAL RADIAL FRACTURE;  Surgeon: Roseanne Kaufman, MD;  Location: [place];  Service: Orthopedics;  Laterality: Right;  DVR Crosslock, MD available sometime around 1430-1500  . TONSILLECTOMY    . TOTAL KNEE ARTHROPLASTY Right 08/05/2016  . TOTAL KNEE  ARTHROPLASTY Right 08/05/2016   Procedure: RIGHT TOTAL KNEE ARTHROPLASTY;  Surgeon: Mcarthur Rossetti, MD;  Location: [place];  Service: Orthopedics;  Laterality: Right;    There were no vitals filed for this visit.      Subjective Assessment - 10/02/16 0853    Subjective No pain, just stiffness.   Pertinent History R TKR 08/05/16   Patient Stated Goals "Build my strength and endurance."   Currently in Pain? No/denies   Pain Score 0-No pain                         [facility] Adult PT Treatment/Exercise - 10/02/16 0847      Knee/Hip Exercises: Aerobic   Recumbent Bike L3 x 6 minutes     Knee/Hip Exercises: Standing   Hip Flexion Both;10 reps;Knee straight;Stengthening   Hip Flexion Limitations green TB + opp LE SLS on blue foam oval, 2 pole A   Hip ADduction Both;10 reps;Strengthening   Hip ADduction Limitations green TB + opp LE SLS on blue foam oval, 2 pole A   Hip Abduction Both;10 reps;Knee straight;Stengthening   Abduction Limitations green TB + opp LE SLS on blue foam oval, 2 pole A   Hip Extension Both;10 reps;Knee straight;Stengthening   Extension Limitations green TB + opp LE SLS on blue foam oval, 2 pole A   Forward Step Up Right;15 reps;Step Height: 8";Hand Hold: 2  Forward Step Up Limitations + blue TB TKE   Functional Squat 15 reps;3 seconds   Functional Squat Limitations TRX + heel raise with return to upright   SLS with Vectors R SLS on blue foam oval + L toe touch to 4 balance bubbles x10   Other Standing Knee Exercises B sidestepping & fwd monster walk with red looped TB at ankles 2 x 43ft   Other Standing Knee Exercises B Fitter hip abduction (1 black) x10 each                     PT Long Term Goals - 09/15/16 0805      PT LONG TERM GOAL #1   Title Independent with ongoing HEP by 10/24/16   Status On-going     PT LONG TERM GOAL #2   Title R knee AROM >/= 3-115 dg to allow for normal gait/stair mechanics by 10/24/16   Status  On-going     PT LONG TERM GOAL #3   Title R hip and knee strength >/= 4/5 for improved stability by 10/24/16   Status On-going     PT LONG TERM GOAL #4   Title Pt will ambulate with normal gait pattern and even weight shift by 10/24/16   Status On-going     PT LONG TERM GOAL #5   Title Pt will ascend/descend stairs with reciprocal pattern by 10/24/16   Status On-going               Plan - 10/02/16 0854    Clinical Impression Statement Continued strengthening progression with increasing emphasis on balance and proprioceptive training. Expected fatigue noted but no pain. Pt motivated to continue with increased challenges to prepare for return to work   PT Treatment/Interventions Patient/family education;ADLs/Self Care Home Management;Therapeutic exercise;Therapeutic activities;Functional mobility training;Gait training;Stair training;Neuromuscular re-education;Balance training;Manual techniques;Scar mobilization;Passive range of motion;Taping;Electrical Stimulation;Cryotherapy;Vasopneumatic Device   PT Next Visit Plan R knee ROM; B LE strengthening - progressing resistance and CKC; proprioceptive training; manual therapy as indicated for increased joint mobility & ROM; modalities PRN   Consulted and Agree with Plan of Care Patient      Patient will benefit from skilled therapeutic intervention in order to improve the following deficits and impairments:  Decreased range of motion, Impaired flexibility, Decreased strength, Difficulty walking, Abnormal gait, Decreased activity tolerance, Decreased endurance, Decreased balance, Decreased scar mobility, Pain  Visit Diagnosis: Stiffness of right knee, not elsewhere classified  Muscle weakness (generalized)  Difficulty in walking, not elsewhere classified  Other abnormalities of gait and mobility  Acute pain of right knee     Problem List Patient Active Problem List   Diagnosis Date Noted  . Status post total right knee  replacement 08/05/2016  . Class 2 obesity without serious comorbidity with body mass index (BMI) of 35.0 to 35.9 in adult 07/23/2016  . Unilateral primary osteoarthritis, right knee 05/20/2016  . Obesity (BMI 35.0-39.9 without comorbidity) 05/06/2016  . Class 2 obesity without serious comorbidity with body mass index (BMI) of 36.0 to 36.9 in adult 04/22/2016  . Class 2 obesity without serious comorbidity with body mass index (BMI) of 37.0 to 37.9 in adult 04/08/2016  . Vitamin D deficiency 04/08/2016  . Shortness of breath 03/25/2016  . Fracture, radius 08/08/2014  . Constipation 09/20/2013  . Syncope and collapse 11/30/2012  . Recurrent cold sores   . Right knee pain 11/23/2011  . Osteoarthritis   . Depression   . Hyperlipidemia     JoAnne  Nolon Rod, PT, MPT 10/02/2016, 9:38 AM  [facility] [address]  [place], [place], [address] Phone: [phone]   Fax:  [phone]  Name: Monique Gonzalez MRN: [id number] Date of Birth: 07-Jan-1956

## 2016-10-05 ENCOUNTER — Encounter (INDEPENDENT_AMBULATORY_CARE_PROVIDER_SITE_OTHER): Payer: Self-pay | Admitting: Orthopaedic Surgery

## 2016-10-06 ENCOUNTER — Other Ambulatory Visit (INDEPENDENT_AMBULATORY_CARE_PROVIDER_SITE_OTHER): Payer: Self-pay

## 2016-10-06 DIAGNOSIS — M545 Low back pain: Principal | ICD-10-CM

## 2016-10-06 DIAGNOSIS — G8929 Other chronic pain: Secondary | ICD-10-CM

## 2016-10-06 MED ORDER — CYCLOBENZAPRINE HCL 10 MG PO TABS: 10.0000 mg | ORAL_TABLET | Freq: Three times a day (TID) | ORAL | 0 refills | Status: DC | PRN

## 2016-10-06 MED FILL — CYCLOBENZAPRINE 10 MG TABLE: 10 | 20 days supply | Qty: 60 | Fill #0

## 2016-10-07 ENCOUNTER — Ambulatory Visit: Payer: 59 | Admitting: Physical Therapy

## 2016-10-07 DIAGNOSIS — R2689 Other abnormalities of gait and mobility: Secondary | ICD-10-CM

## 2016-10-07 DIAGNOSIS — R262 Difficulty in walking, not elsewhere classified: Secondary | ICD-10-CM

## 2016-10-07 DIAGNOSIS — M25561 Pain in right knee: Secondary | ICD-10-CM

## 2016-10-07 DIAGNOSIS — M6281 Muscle weakness (generalized): Secondary | ICD-10-CM

## 2016-10-07 DIAGNOSIS — M25661 Stiffness of right knee, not elsewhere classified: Secondary | ICD-10-CM

## 2016-10-07 NOTE — Therapy (Addendum)
[facility] [address]  [place], [place], [address] Phone: [phone]   Fax:  [phone]  Physical Therapy Treatment  Patient Details  Name: Monique Gonzalez MRN: [id number] Date of Birth: 1955/09/14 Referring Provider: Jean Rosenthal, MD  Encounter Date: 10/07/2016      PT End of Session - 10/07/16 1112    Visit Number 10   Number of Visits 14   Date for PT Re-Evaluation 10/24/16   Authorization Type [facility]   PT Start Time 1104   PT Stop Time 1154   PT Time Calculation (min) 50 min   Activity Tolerance Patient tolerated treatment well   Behavior During Therapy [facility] for tasks assessed/performed      Past Medical History:  Diagnosis Date  . Allergy   . Chronic pain    back and knee  . Colitis 09/2012   infectious vs inflammatory.   . Colon stricture ([place])   . Depression   . Hyperlipidemia   . Obesity   . Osteoarthritis   . Osteopenia   . Recurrent cold sores   . Seasonal allergies   . Syncope and collapse 11/30/2012   Normal EEG.  Vaso vagal syncope    Past Surgical History:  Procedure Laterality Date  . COLON RESECTION N/A 10/14/2013   Procedure: LAPAROSCOPIC MOBILIZATION OF SPLENIC FLEXURE, LAPAROSCOPIC EXTENDED LEFT COLECTOMY;  Surgeon: [name], MD;  Location: [place];  Service: General;  Laterality: N/A;  . COLONOSCOPY N/A 10/12/2013   Procedure: COLONOSCOPY;  Surgeon: [name], MD;  Location: [facility] ENDOSCOPY;  Service: Endoscopy;  Laterality: N/A;  . DILATION AND CURETTAGE, DIAGNOSTIC / THERAPEUTIC  1987  . OPEN REDUCTION INTERNAL FIXATION (ORIF) DISTAL RADIAL FRACTURE Right 08/08/2014   Procedure: OPEN REDUCTION INTERNAL FIXATION (ORIF) DISTAL RADIAL FRACTURE;  Surgeon: Roseanne Kaufman, MD;  Location: [place];  Service: Orthopedics;  Laterality: Right;  DVR Crosslock, MD available sometime around 1430-1500  . TONSILLECTOMY    . TOTAL KNEE ARTHROPLASTY Right 08/05/2016  . TOTAL KNEE  ARTHROPLASTY Right 08/05/2016   Procedure: RIGHT TOTAL KNEE ARTHROPLASTY;  Surgeon: Mcarthur Rossetti, MD;  Location: [place];  Service: Orthopedics;  Laterality: Right;    There were no vitals filed for this visit.      Subjective Assessment - 10/07/16 1111    Subjective Pt looking forward to going back to work.   Pertinent History R TKR 08/05/16   Patient Stated Goals "Build my strength and endurance."   Currently in Pain? No/denies   Pain Score 0-No pain            [facility] PT Assessment - 10/07/16 1104      Assessment   Medical Diagnosis R TKR   Referring Provider Jean Rosenthal, MD   Onset Date/Surgical Date 08/05/16   Next MD Visit 10/14/16     Observation/Other Assessments   Focus on Therapeutic Outcomes (FOTO)  Knee - 55% (45% limitation)     AROM   Right Knee Extension 0   Right Knee Flexion 127     Strength   Right Hip Flexion 4+/5   Right Hip Extension 4-/5   Right Hip ABduction 4/5   Right Hip ADduction 4-/5   Left Hip Flexion 4+/5   Left Hip Extension 4/5   Left Hip ABduction 4+/5   Left Hip ADduction 4/5   Right Knee Flexion 4+/5   Right Knee Extension 4+/5   Left Knee Flexion 5/5   Left Knee  Extension 5/5                     [facility] Adult PT Treatment/Exercise - 10/07/16 1104      Ambulation/Gait   Ambulation/Gait Yes   Ambulation/Gait Assistance 7: Independent   Gait Pattern Within Functional Limits   Stairs Yes   Stairs Assistance 5: Supervision   Stair Management Technique One rail Right;Alternating pattern;Forwards   Number of Stairs 13   Height of Stairs 7   Gait Comments exaggerated effort necessary with R LE lift on stair ascent, limited eccentric control with R LE lowering on descent     Knee/Hip Exercises: Aerobic   Recumbent Bike L3 x 6'     Knee/Hip Exercises: Standing   Forward Step Up Right;15 reps;Step Height: 8";Hand Hold: 2   Forward Step Up Limitations + black TB TKE   Step Down Right;15 reps;Hand Hold:  2;Step Height: 6"   Step Down Limitations forward step down to simulate stairs; 8" step attempted but poor control and LOB upon attempt to lift back up onto step   SLS with Vectors R SLS on blue foam oval + L toe touch to 4 balance bubbles x12; 1-2 pole A   Other Standing Knee Exercises B sidestepping & fwd monster walk with red looped TB at ankles 2 x 51f                PT Education - 10/07/16 1155    Education provided Yes   Education Details HEP update - 4 way standing SLR progressed to green TB; fwd & lat step-downs, side-stepping & monster walk with red TB added   Person(s) Educated Patient   Methods Explanation;Demonstration;Handout   Comprehension Verbalized understanding;Returned demonstration             PT Long Term Goals - 10/07/16 1113      PT LONG TERM GOAL #1   Title Independent with ongoing HEP by 10/24/16   Status Partially Met  met for current HEP     PT LONG TERM GOAL #2   Title R knee AROM >/= 3-115 dg to allow for normal gait/stair mechanics by 10/24/16   Status Achieved     PT LONG TERM GOAL #3   Title R hip and knee strength >/= 4/5 for improved stability by 10/24/16   Status Partially Met  met for R knee, partially met for R hip     PT LONG TERM GOAL #4   Title Pt will ambulate with normal gait pattern and even weight shift by 10/24/16   Status Achieved     PT LONG TERM GOAL #5   Title Pt will ascend/descend stairs with reciprocal pattern by 10/24/16   Status Partially Met  able to approach stairs reciprocally, but extra effort required with R LE lead on step-up and limited R eccentric control on step-down               Plan - 10/07/16 1113    Clinical Impression Statement KAlysianahas demonstrated good progress with PT thus far. Pt able to ambulate with normal gait pattern w/o AD. R knee AROM 0-127 and R LE strength significantly improved, but limited functional strength and eccentric control persists with stairs as well as limited  balance proprioceptive control. Pt hoping to return to work on Thursday next week following MD appt on Tuesday, after which her work schedule will limit ability to attend PT. Will plan to continue to progress/update HEP in prep for  discharge/transition to HEP next week.   Rehab Potential Good   Clinical Impairments Affecting Rehab Potential obesity   PT Treatment/Interventions Patient/family education;ADLs/Self Care Home Management;Therapeutic exercise;Therapeutic activities;Functional mobility training;Gait training;Stair training;Neuromuscular re-education;Balance training;Manual techniques;Scar mobilization;Passive range of motion;Taping;Electrical Stimulation;Cryotherapy;Vasopneumatic Device   PT Next Visit Plan R knee ROM; B LE strengthening - progressing resistance and CKC; proprioceptive training; manual therapy as indicated for increased joint mobility & ROM; modalities PRN   Consulted and Agree with Plan of Care Patient      Patient will benefit from skilled therapeutic intervention in order to improve the following deficits and impairments:  Decreased range of motion, Impaired flexibility, Decreased strength, Difficulty walking, Abnormal gait, Decreased activity tolerance, Decreased endurance, Decreased balance, Decreased scar mobility, Pain  Visit Diagnosis: Stiffness of right knee, not elsewhere classified  Muscle weakness (generalized)  Difficulty in walking, not elsewhere classified  Other abnormalities of gait and mobility  Acute pain of right knee     Problem List Patient Active Problem List   Diagnosis Date Noted  . Status post total right knee replacement 08/05/2016  . Class 2 obesity without serious comorbidity with body mass index (BMI) of 35.0 to 35.9 in adult 07/23/2016  . Unilateral primary osteoarthritis, right knee 05/20/2016  . Obesity (BMI 35.0-39.9 without comorbidity) 05/06/2016  . Class 2 obesity without serious comorbidity with body mass index (BMI) of  36.0 to 36.9 in adult 04/22/2016  . Class 2 obesity without serious comorbidity with body mass index (BMI) of 37.0 to 37.9 in adult 04/08/2016  . Vitamin D deficiency 04/08/2016  . Shortness of breath 03/25/2016  . Fracture, radius 08/08/2014  . Constipation 09/20/2013  . Syncope and collapse 11/30/2012  . Recurrent cold sores   . Right knee pain 11/23/2011  . Osteoarthritis   . Depression   . Hyperlipidemia     Percival Spanish, PT, MPT 10/07/2016, 12:12 PM  [facility] [address]  [place], [place], [address] Phone: [phone]   Fax:  [phone]  Name: Monique Gonzalez MRN: [id number] Date of Birth: [birth year]

## 2016-10-09 ENCOUNTER — Ambulatory Visit: Payer: 59 | Attending: Orthopaedic Surgery

## 2016-10-09 DIAGNOSIS — M6281 Muscle weakness (generalized): Secondary | ICD-10-CM | POA: Insufficient documentation

## 2016-10-09 DIAGNOSIS — R2689 Other abnormalities of gait and mobility: Secondary | ICD-10-CM | POA: Diagnosis not present

## 2016-10-09 DIAGNOSIS — M25561 Pain in right knee: Secondary | ICD-10-CM | POA: Insufficient documentation

## 2016-10-09 DIAGNOSIS — R262 Difficulty in walking, not elsewhere classified: Secondary | ICD-10-CM | POA: Diagnosis not present

## 2016-10-09 DIAGNOSIS — M25661 Stiffness of right knee, not elsewhere classified: Secondary | ICD-10-CM | POA: Insufficient documentation

## 2016-10-09 NOTE — Therapy (Signed)
[facility] [address]  [place], [place], [address] Phone: [phone]   Fax:  [phone]  Physical Therapy Treatment  Patient Details  Name: Monique Gonzalez MRN: [id number] Date of Birth: 15-Nov-1955 Referring Provider: Jean Rosenthal, MD  Encounter Date: 10/09/2016      PT End of Session - 10/09/16 1131    Visit Number 11   Number of Visits 14   Date for PT Re-Evaluation 10/24/16   Authorization Type [facility]   PT Start Time 1100   PT Stop Time 1142   PT Time Calculation (min) 42 min   Activity Tolerance Patient tolerated treatment well   Behavior During Therapy [facility] for tasks assessed/performed      Past Medical History:  Diagnosis Date  . Allergy   . Chronic pain    back and knee  . Colitis 09/2012   infectious vs inflammatory.   . Colon stricture ([place])   . Depression   . Hyperlipidemia   . Obesity   . Osteoarthritis   . Osteopenia   . Recurrent cold sores   . Seasonal allergies   . Syncope and collapse 11/30/2012   Normal EEG.  Vaso vagal syncope    Past Surgical History:  Procedure Laterality Date  . COLON RESECTION N/A 10/14/2013   Procedure: LAPAROSCOPIC MOBILIZATION OF SPLENIC FLEXURE, LAPAROSCOPIC EXTENDED LEFT COLECTOMY;  Surgeon: [name], MD;  Location: [place];  Service: General;  Laterality: N/A;  . COLONOSCOPY N/A 10/12/2013   Procedure: COLONOSCOPY;  Surgeon: [name], MD;  Location: [facility] ENDOSCOPY;  Service: Endoscopy;  Laterality: N/A;  . DILATION AND CURETTAGE, DIAGNOSTIC / THERAPEUTIC  1987  . OPEN REDUCTION INTERNAL FIXATION (ORIF) DISTAL RADIAL FRACTURE Right 08/08/2014   Procedure: OPEN REDUCTION INTERNAL FIXATION (ORIF) DISTAL RADIAL FRACTURE;  Surgeon: Roseanne Kaufman, MD;  Location: [place];  Service: Orthopedics;  Laterality: Right;  DVR Crosslock, MD available sometime around 1430-1500  . TONSILLECTOMY    . TOTAL KNEE ARTHROPLASTY Right 08/05/2016  . TOTAL KNEE  ARTHROPLASTY Right 08/05/2016   Procedure: RIGHT TOTAL KNEE ARTHROPLASTY;  Surgeon: Mcarthur Rossetti, MD;  Location: [place];  Service: Orthopedics;  Laterality: Right;    There were no vitals filed for this visit.      Subjective Assessment - 10/09/16 1134    Subjective Pt. doing well still anticipating return to work next week.     Patient Stated Goals "Build my strength and endurance."   Currently in Pain? No/denies   Pain Score 0-No pain   Multiple Pain Sites No                         [facility] Adult PT Treatment/Exercise - 10/09/16 1151      Self-Care   Self-Care Other Self-Care Comments   Other Self-Care Comments  Review and discussion of current HEP to check for appropriateness of activities; strengthening activities added to target remaining deficits     Knee/Hip Exercises: Stretches   Press photographer 1 rep;60 seconds;Right   Gastroc Stretch Limitations R prostretch      Knee/Hip Exercises: Aerobic   Recumbent Bike L3 x 6'     Knee/Hip Exercises: Machines for Strengthening   Cybex Knee Extension 25# B con/R ecc 2 x 15 reps   Total Gym Leg Press B LE's 35# x 20 reps      Knee/Hip Exercises: Standing   Step Down Step Height: 6";2 sets;Right;15 reps  Knee/Hip Exercises: Supine   Bridges with Diona Foley Squeeze Both;20 reps;2 sets  with adduction ball squeeze      Knee/Hip Exercises: Sidelying   Hip ABduction Right;2 sets;20 reps  3" hold    Hip ABduction Limitations 2                PT Education - 10/09/16 1130    Education provided Yes   Education Details sidelying hip adduction (with pt. ankle weight 2.5 lbs) , bridge adduction ball squeeze    Person(s) Educated Patient   Methods Explanation;Demonstration;Verbal cues;Handout   Comprehension Verbalized understanding;Returned demonstration;Verbal cues required;Need further instruction             PT Long Term Goals - 10/07/16 1113      PT LONG TERM GOAL #1   Title Independent  with ongoing HEP by 10/24/16   Status Partially Met  met for current HEP     PT LONG TERM GOAL #2   Title R knee AROM >/= 3-115 dg to allow for normal gait/stair mechanics by 10/24/16   Status Achieved     PT LONG TERM GOAL #3   Title R hip and knee strength >/= 4/5 for improved stability by 10/24/16   Status Partially Met  met for R knee, partially met for R hip     PT LONG TERM GOAL #4   Title Pt will ambulate with normal gait pattern and even weight shift by 10/24/16   Status Achieved     PT LONG TERM GOAL #5   Title Pt will ascend/descend stairs with reciprocal pattern by 10/24/16   Status Partially Met  able to approach stairs reciprocally, but extra effort required with R LE lead on step-up and limited R eccentric control on step-down               Plan - 10/09/16 1132    Clinical Impression Statement Monique Gonzalez doing fine today anticipating return to work next week.  Treatment focusing on review and adjustment of home program to target remaining strength deficits.  Strengthening therex focusing on R eccentric control to improve stair navigation and adductor strengthening.  Pt. progressing well at this point.    PT Treatment/Interventions Patient/family education;ADLs/Self Care Home Management;Therapeutic exercise;Therapeutic activities;Functional mobility training;Gait training;Stair training;Neuromuscular re-education;Balance training;Manual techniques;Scar mobilization;Passive range of motion;Taping;Electrical Stimulation;Cryotherapy;Vasopneumatic Device   PT Next Visit Plan R knee ROM; B LE strengthening - progressing resistance and CKC; proprioceptive training; manual therapy as indicated for increased joint mobility & ROM; modalities PRN      Patient will benefit from skilled therapeutic intervention in order to improve the following deficits and impairments:  Decreased range of motion, Impaired flexibility, Decreased strength, Difficulty walking, Abnormal gait, Decreased  activity tolerance, Decreased endurance, Decreased balance, Decreased scar mobility, Pain  Visit Diagnosis: Stiffness of right knee, not elsewhere classified  Muscle weakness (generalized)  Difficulty in walking, not elsewhere classified  Other abnormalities of gait and mobility  Acute pain of right knee     Problem List Patient Active Problem List   Diagnosis Date Noted  . Status post total right knee replacement 08/05/2016  . Class 2 obesity without serious comorbidity with body mass index (BMI) of 35.0 to 35.9 in adult 07/23/2016  . Unilateral primary osteoarthritis, right knee 05/20/2016  . Obesity (BMI 35.0-39.9 without comorbidity) 05/06/2016  . Class 2 obesity without serious comorbidity with body mass index (BMI) of 36.0 to 36.9 in adult 04/22/2016  . Class 2 obesity without serious comorbidity with body  mass index (BMI) of 37.0 to 37.9 in adult 04/08/2016  . Vitamin D deficiency 04/08/2016  . Shortness of breath 03/25/2016  . Fracture, radius 08/08/2014  . Constipation 09/20/2013  . Syncope and collapse 11/30/2012  . Recurrent cold sores   . Right knee pain 11/23/2011  . Osteoarthritis   . Depression   . Hyperlipidemia     Bess Harvest, PTA 10/09/16 11:59 AM   [facility] [address]  [place], [place], [address] Phone: [phone]   Fax:  [phone]  Name: Monique Gonzalez MRN: [id number] Date of Birth: 1955/09/27

## 2016-10-13 ENCOUNTER — Ambulatory Visit: Payer: 59 | Admitting: Physical Therapy

## 2016-10-13 DIAGNOSIS — M25661 Stiffness of right knee, not elsewhere classified: Secondary | ICD-10-CM | POA: Diagnosis not present

## 2016-10-13 DIAGNOSIS — R2689 Other abnormalities of gait and mobility: Secondary | ICD-10-CM

## 2016-10-13 DIAGNOSIS — M25561 Pain in right knee: Secondary | ICD-10-CM | POA: Diagnosis not present

## 2016-10-13 DIAGNOSIS — R262 Difficulty in walking, not elsewhere classified: Secondary | ICD-10-CM | POA: Diagnosis not present

## 2016-10-13 DIAGNOSIS — M6281 Muscle weakness (generalized): Secondary | ICD-10-CM

## 2016-10-13 NOTE — Therapy (Signed)
[facility] [address]  [place], [place], [address] Phone: [phone]   Fax:  [phone]  Physical Therapy Treatment  Patient Details  Name: Monique Gonzalez MRN: [id number] Date of Birth: May 18, 1955 Referring Provider: Jean Rosenthal, MD  Encounter Date: 10/13/2016      PT End of Session - 10/13/16 0922    Visit Number 12   Number of Visits 14   Date for PT Re-Evaluation 10/24/16   Authorization Type [facility]   PT Start Time 0922   PT Stop Time 1014   PT Time Calculation (min) 52 min   Activity Tolerance Patient tolerated treatment well   Behavior During Therapy [facility] for tasks assessed/performed      Past Medical History:  Diagnosis Date  . Allergy   . Chronic pain    back and knee  . Colitis 09/2012   infectious vs inflammatory.   . Colon stricture ([place])   . Depression   . Hyperlipidemia   . Obesity   . Osteoarthritis   . Osteopenia   . Recurrent cold sores   . Seasonal allergies   . Syncope and collapse 11/30/2012   Normal EEG.  Vaso vagal syncope    Past Surgical History:  Procedure Laterality Date  . COLON RESECTION N/A 10/14/2013   Procedure: LAPAROSCOPIC MOBILIZATION OF SPLENIC FLEXURE, LAPAROSCOPIC EXTENDED LEFT COLECTOMY;  Surgeon: [name], MD;  Location: [place];  Service: General;  Laterality: N/A;  . COLONOSCOPY N/A 10/12/2013   Procedure: COLONOSCOPY;  Surgeon: [name], MD;  Location: [facility] ENDOSCOPY;  Service: Endoscopy;  Laterality: N/A;  . DILATION AND CURETTAGE, DIAGNOSTIC / THERAPEUTIC  1987  . OPEN REDUCTION INTERNAL FIXATION (ORIF) DISTAL RADIAL FRACTURE Right 08/08/2014   Procedure: OPEN REDUCTION INTERNAL FIXATION (ORIF) DISTAL RADIAL FRACTURE;  Surgeon: Roseanne Kaufman, MD;  Location: [place];  Service: Orthopedics;  Laterality: Right;  DVR Crosslock, MD available sometime around 1430-1500  . TONSILLECTOMY    . TOTAL KNEE ARTHROPLASTY Right 08/05/2016  . TOTAL KNEE  ARTHROPLASTY Right 08/05/2016   Procedure: RIGHT TOTAL KNEE ARTHROPLASTY;  Surgeon: Mcarthur Rossetti, MD;  Location: [place];  Service: Orthopedics;  Laterality: Right;    There were no vitals filed for this visit.      Subjective Assessment - 10/13/16 0925    Subjective Pt noting increased R knee stiffness this morning but was more active over the weekend, spending the last 3 days at the [facility] to celebrate her mother's 90th birthday.   Pertinent History R TKR 08/05/16   Patient Stated Goals "Build my strength and endurance."   Currently in Pain? No/denies   Pain Score 0-No pain            [facility] PT Assessment - 10/13/16 6389      Assessment   Medical Diagnosis R TKR   Referring Provider Jean Rosenthal, MD   Onset Date/Surgical Date 08/05/16   Next MD Visit 10/14/16     AROM   Right Knee Extension 0   Right Knee Flexion 127     Strength   Right Hip Flexion 4+/5   Right Hip Extension 4-/5   Right Hip ABduction 4/5   Right Hip ADduction 4/5   Left Hip Flexion 5/5   Left Hip Extension 4/5   Left Hip ABduction 4+/5   Left Hip ADduction 4/5   Right Knee Flexion 4+/5   Right Knee Extension 4+/5   Left Knee Flexion 5/5  Left Knee Extension 5/5     Ambulation/Gait   Stairs Yes   Stairs Assistance 5: Supervision   Stair Management Technique One rail Right;Alternating pattern;Forwards   Number of Stairs 13   Height of Stairs 7   Gait Comments extra effort necessary with R LE lift on stair ascent, limited end range eccentric control with R LE lowering on descent                     [facility] Adult PT Treatment/Exercise - 10/13/16 0922      Knee/Hip Exercises: Aerobic   Recumbent Bike L3 x 6'     Knee/Hip Exercises: Machines for Strengthening   Total Gym Leg Press B LE's 35# x 25, R LE 15# x15     Knee/Hip Exercises: Standing   Forward Lunges Both;10 reps;3 seconds   Forward Lunges Limitations UE support on counter   Step Down Both;15 reps;Hand  Hold: 2;Step Height: 6"   Step Down Limitations forward eccentric step down to simulate stairs; R lateral eccentric step down   Functional Squat 15 reps;3 seconds   Functional Squat Limitations TRX + heel raise with return to upright   SLS with Vectors R SLS on blue foam oval + L toe touch to 4 balance bubbles x15; 1 pole A                PT Education - 10/13/16 1015    Education provided Yes   Education Details deferred bridges due to increased LBP; reviewed pt's Total Gym exercise flip-book & indentified appropriate LE exercise for home performance   Person(s) Educated Patient   Methods Explanation   Comprehension Verbalized understanding             PT Long Term Goals - 10/13/16 0927      PT LONG TERM GOAL #1   Title Independent with ongoing HEP by 10/24/16   Status Partially Met  met for current HEP     PT LONG TERM GOAL #2   Title R knee AROM >/= 3-115 dg to allow for normal gait/stair mechanics by 10/24/16   Status Achieved     PT LONG TERM GOAL #3   Title R hip and knee strength >/= 4/5 for improved stability by 10/24/16   Status Partially Met  met except R hip extension     PT LONG TERM GOAL #4   Title Pt will ambulate with normal gait pattern and even weight shift by 10/24/16   Status Achieved     PT LONG TERM GOAL #5   Title Pt will ascend/descend stairs with reciprocal pattern by 10/24/16   Status Partially Met  able to approach stairs reciprocally, but extra effort required with R LE lead on step-up and limited R eccentric control on step-down               Plan - 10/13/16 0928    Clinical Impression Statement Monique Gonzalez doing well with PT. Pt able to ambulate with normal gait pattern w/o AD. R knee AROM 0-127 and R LE strength grossly 4/5 or greater with exception of R hip extension 4-/5, but limited functional strength and eccentric control persists especially with stairs, although gradually improving. Pt hoping to return to work on Thursday  following MD appt on Tuesday, after which her work schedule will limit ability to attend PT. As such HEP reviewed & updated in prep for discharge/transition to HEP after next visit unless otherwise indicated by MD.   Rehab  Potential Good   Clinical Impairments Affecting Rehab Potential obesity   PT Treatment/Interventions Patient/family education;ADLs/Self Care Home Management;Therapeutic exercise;Therapeutic activities;Functional mobility training;Gait training;Stair training;Neuromuscular re-education;Balance training;Manual techniques;Scar mobilization;Passive range of motion;Taping;Electrical Stimulation;Cryotherapy;Vasopneumatic Device   PT Next Visit Plan Anticipate D/C with transition to HEP   Consulted and Agree with Plan of Care Patient      Patient will benefit from skilled therapeutic intervention in order to improve the following deficits and impairments:  Decreased range of motion, Impaired flexibility, Decreased strength, Difficulty walking, Abnormal gait, Decreased activity tolerance, Decreased endurance, Decreased balance, Decreased scar mobility, Pain  Visit Diagnosis: Stiffness of right knee, not elsewhere classified  Muscle weakness (generalized)  Difficulty in walking, not elsewhere classified  Other abnormalities of gait and mobility  Acute pain of right knee     Problem List Patient Active Problem List   Diagnosis Date Noted  . Status post total right knee replacement 08/05/2016  . Class 2 obesity without serious comorbidity with body mass index (BMI) of 35.0 to 35.9 in adult 07/23/2016  . Unilateral primary osteoarthritis, right knee 05/20/2016  . Obesity (BMI 35.0-39.9 without comorbidity) 05/06/2016  . Class 2 obesity without serious comorbidity with body mass index (BMI) of 36.0 to 36.9 in adult 04/22/2016  . Class 2 obesity without serious comorbidity with body mass index (BMI) of 37.0 to 37.9 in adult 04/08/2016  . Vitamin D deficiency 04/08/2016  .  Shortness of breath 03/25/2016  . Fracture, radius 08/08/2014  . Constipation 09/20/2013  . Syncope and collapse 11/30/2012  . Recurrent cold sores   . Right knee pain 11/23/2011  . Osteoarthritis   . Depression   . Hyperlipidemia     Percival Spanish, PT, MPT 10/13/2016, 10:40 AM  [facility] [address]  [place], [place], [address] Phone: [phone]   Fax:  [phone]  Name: Monique Gonzalez MRN: [id number] Date of Birth: 09/04/1955

## 2016-10-14 ENCOUNTER — Encounter (INDEPENDENT_AMBULATORY_CARE_PROVIDER_SITE_OTHER): Payer: Self-pay | Admitting: Physician Assistant

## 2016-10-14 ENCOUNTER — Ambulatory Visit (INDEPENDENT_AMBULATORY_CARE_PROVIDER_SITE_OTHER): Payer: 59 | Admitting: Physician Assistant

## 2016-10-14 DIAGNOSIS — Z96651 Presence of right artificial knee joint: Secondary | ICD-10-CM

## 2016-10-14 HISTORY — DX: Presence of right artificial knee joint: Z96.651

## 2016-10-14 NOTE — Progress Notes (Signed)
Mrs. Thoennes returns today now 70 days status post right total knee arthroplasty. She overall is doing very well. She states knee is much better than what she had preop. She is asking to go back to work. She has no concerns.  Physical exam right knee full extension flexion to 110. No instability valgus varus stressing. Surgical incisions healing well. Calf supple nontender. Dorsiflexion plantar flexion ankle intact.  Impression: Status post right total knee arthroplasty 08/05/2016  Plan: Burnis Medin have her return to work on 10/16/2016 duties. She'll follow with [name] at 1 year postop. Sooner if there is any questions or concerns. She is to continue to work on quad strengthening and scar tissue mobilization. Questions were encouraged and answered.

## 2016-10-15 ENCOUNTER — Ambulatory Visit: Payer: 59 | Admitting: Physical Therapy

## 2016-10-15 ENCOUNTER — Ambulatory Visit (INDEPENDENT_AMBULATORY_CARE_PROVIDER_SITE_OTHER): Payer: 59 | Admitting: Family Medicine

## 2016-10-15 VITALS — BP 105/72 | HR 89 | Temp 97.9°F | Ht 72.0 in | Wt 256.0 lb

## 2016-10-15 DIAGNOSIS — R262 Difficulty in walking, not elsewhere classified: Secondary | ICD-10-CM

## 2016-10-15 DIAGNOSIS — M6281 Muscle weakness (generalized): Secondary | ICD-10-CM

## 2016-10-15 DIAGNOSIS — Z9189 Other specified personal risk factors, not elsewhere classified: Secondary | ICD-10-CM | POA: Diagnosis not present

## 2016-10-15 DIAGNOSIS — Z6834 Body mass index (BMI) 34.0-34.9, adult: Secondary | ICD-10-CM

## 2016-10-15 DIAGNOSIS — E88819 Insulin resistance, unspecified: Secondary | ICD-10-CM

## 2016-10-15 DIAGNOSIS — R2689 Other abnormalities of gait and mobility: Secondary | ICD-10-CM

## 2016-10-15 DIAGNOSIS — E8881 Metabolic syndrome: Secondary | ICD-10-CM

## 2016-10-15 DIAGNOSIS — M25561 Pain in right knee: Secondary | ICD-10-CM | POA: Diagnosis not present

## 2016-10-15 DIAGNOSIS — E669 Obesity, unspecified: Secondary | ICD-10-CM | POA: Diagnosis not present

## 2016-10-15 DIAGNOSIS — M25661 Stiffness of right knee, not elsewhere classified: Secondary | ICD-10-CM | POA: Diagnosis not present

## 2016-10-15 HISTORY — DX: Insulin resistance, unspecified: E88.819

## 2016-10-15 NOTE — Therapy (Addendum)
[facility] [address]  [place], [place], [address] Phone: [phone]   Fax:  [phone]  Physical Therapy Treatment  Patient Details  Name: Monique Gonzalez MRN: [id number] Date of Birth: 1955/04/15 Referring Provider: Jean Rosenthal, MD  Encounter Date: 10/15/2016      PT End of Session - 10/15/16 1315    Visit Number 13   Number of Visits 14   Date for PT Re-Evaluation 10/24/16   Authorization Type [facility]   PT Start Time 1312   PT Stop Time 1351   PT Time Calculation (min) 39 min   Activity Tolerance Patient tolerated treatment well   Behavior During Therapy [facility] for tasks assessed/performed      Past Medical History:  Diagnosis Date  . Allergy   . Chronic pain    back and knee  . Colitis 09/2012   infectious vs inflammatory.   . Colon stricture ([place])   . Depression   . Hyperlipidemia   . Obesity   . Osteoarthritis   . Osteopenia   . Recurrent cold sores   . Seasonal allergies   . Syncope and collapse 11/30/2012   Normal EEG.  Vaso vagal syncope    Past Surgical History:  Procedure Laterality Date  . COLON RESECTION N/A 10/14/2013   Procedure: LAPAROSCOPIC MOBILIZATION OF SPLENIC FLEXURE, LAPAROSCOPIC EXTENDED LEFT COLECTOMY;  Surgeon: [name], MD;  Location: [place];  Service: General;  Laterality: N/A;  . COLONOSCOPY N/A 10/12/2013   Procedure: COLONOSCOPY;  Surgeon: [name], MD;  Location: [facility] ENDOSCOPY;  Service: Endoscopy;  Laterality: N/A;  . DILATION AND CURETTAGE, DIAGNOSTIC / THERAPEUTIC  1987  . OPEN REDUCTION INTERNAL FIXATION (ORIF) DISTAL RADIAL FRACTURE Right 08/08/2014   Procedure: OPEN REDUCTION INTERNAL FIXATION (ORIF) DISTAL RADIAL FRACTURE;  Surgeon: Roseanne Kaufman, MD;  Location: [place];  Service: Orthopedics;  Laterality: Right;  DVR Crosslock, MD available sometime around 1430-1500  . TONSILLECTOMY    . TOTAL KNEE ARTHROPLASTY Right 08/05/2016  . TOTAL KNEE  ARTHROPLASTY Right 08/05/2016   Procedure: RIGHT TOTAL KNEE ARTHROPLASTY;  Surgeon: Mcarthur Rossetti, MD;  Location: [place];  Service: Orthopedics;  Laterality: Right;    There were no vitals filed for this visit.      Subjective Assessment - 10/15/16 1312    Subjective ready for discharge; plans to return to work tomorrow; some calf soreness   Pertinent History R TKR 08/05/16   How long can you walk comfortably? > 30 minutes   Patient Stated Goals "Build my strength and endurance."   Currently in Pain? No/denies   Pain Score 0-No pain            [facility] PT Assessment - 10/15/16 0001      Observation/Other Assessments   Focus on Therapeutic Outcomes (FOTO)  Knee: 56% (44% limited)                     [facility] Adult PT Treatment/Exercise - 10/15/16 1318      Knee/Hip Exercises: Aerobic   Recumbent Bike L3 x 8 minutes     Knee/Hip Exercises: Machines for Strengthening   Total Gym Leg Press BLE 35# x 30; R LE 15# x 20     Knee/Hip Exercises: Standing   Forward Step Up Right;15 reps;Step Height: 8";Hand Hold: 2   Functional Squat 15 reps;3 seconds   Functional Squat Limitations TRX     Knee/Hip Exercises: Supine   Straight  Leg Raises Strengthening;Right;15 reps   Straight Leg Raises Limitations 4#   Straight Leg Raise with External Rotation Strengthening;Right;15 reps   Straight Leg Raise with External Rotation Limitations 4#     Knee/Hip Exercises: Prone   Hamstring Curl 15 reps;5 seconds   Hamstring Curl Limitations R LE only - 4#                     PT Long Term Goals - 10/15/16 1316      PT LONG TERM GOAL #1   Title Independent with ongoing HEP by 10/24/16   Status Achieved     PT LONG TERM GOAL #2   Title R knee AROM >/= 3-115 dg to allow for normal gait/stair mechanics by 10/24/16   Status Achieved     PT LONG TERM GOAL #3   Title R hip and knee strength >/= 4/5 for improved stability by 10/24/16   Status Partially Met     PT  LONG TERM GOAL #4   Title Pt will ambulate with normal gait pattern and even weight shift by 10/24/16   Status Achieved     PT LONG TERM GOAL #5   Title Pt will ascend/descend stairs with reciprocal pattern by 10/24/16   Status Achieved               Plan - 10/15/16 1317    Clinical Impression Statement Maudie Mercury doing well with all functional mobility. Has met or partially met all established goals at this point - will likely to conitnue to gain strength with continued exercise regimen. PT and patient discussing initiating a walking program with good carryover. Patient to d/c from PT with good ability to transition to HEP independently.    PT Treatment/Interventions Patient/family education;ADLs/Self Care Home Management;Therapeutic exercise;Therapeutic activities;Functional mobility training;Gait training;Stair training;Neuromuscular re-education;Balance training;Manual techniques;Scar mobilization;Passive range of motion;Taping;Electrical Stimulation;Cryotherapy;Vasopneumatic Device   PT Next Visit Plan transition to HEP; d/c on this date   Consulted and Agree with Plan of Care Patient      Patient will benefit from skilled therapeutic intervention in order to improve the following deficits and impairments:  Decreased range of motion, Impaired flexibility, Decreased strength, Difficulty walking, Abnormal gait, Decreased activity tolerance, Decreased endurance, Decreased balance, Decreased scar mobility, Pain  Visit Diagnosis: Stiffness of right knee, not elsewhere classified  Muscle weakness (generalized)  Difficulty in walking, not elsewhere classified  Other abnormalities of gait and mobility  Acute pain of right knee     Problem List Patient Active Problem List   Diagnosis Date Noted  . Insulin resistance 10/15/2016  . Presence of right artificial knee joint 10/14/2016  . Status post total right knee replacement 08/05/2016  . Class 1 obesity with serious comorbidity and  body mass index (BMI) of 34.0 to 34.9 in adult 07/23/2016  . Unilateral primary osteoarthritis, right knee 05/20/2016  . Obesity (BMI 35.0-39.9 without comorbidity) 05/06/2016  . Class 2 obesity without serious comorbidity with body mass index (BMI) of 36.0 to 36.9 in adult 04/22/2016  . Class 2 obesity without serious comorbidity with body mass index (BMI) of 37.0 to 37.9 in adult 04/08/2016  . Vitamin D deficiency 04/08/2016  . Shortness of breath 03/25/2016  . Fracture, radius 08/08/2014  . Constipation 09/20/2013  . Syncope and collapse 11/30/2012  . Recurrent cold sores   . Right knee pain 11/23/2011  . Osteoarthritis   . Depression   . Hyperlipidemia      Lanney Gins, PT, DPT 10/15/16  2:13 PM   [facility] [address]  [place], [place], [address] Phone: [phone]   Fax:  [phone]  Name: YUVONNE LANAHAN MRN: [id number] Date of Birth: 10/20/1955   PHYSICAL THERAPY DISCHARGE SUMMARY  Visits from Start of Care: 13  Current functional level related to goals / functional outcomes:   Refer to above clinical impression.   Remaining deficits:   As above.   Education / Equipment:   HEP  Plan: Patient agrees to discharge.  Patient goals were not met. Patient is being discharged due to being pleased with the current functional level.  ?????    Percival Spanish, PT, MPT 10/23/16, 10:28 AM  [facility] [address]  [place], [place], [address] Phone: [phone]   Fax:  [phone]

## 2016-10-16 NOTE — Progress Notes (Signed)
Office: [phone]  /  Fax: [phone]   HPI:   Chief Complaint: OBESITY Monique Gonzalez is here to discuss her progress with her obesity treatment plan. She is on the Category 2 plan and is following her eating plan approximately 85 to 90 % of the time. She states she is doing physical therapy 30 to 45 minutes 7 times per week. Monique Gonzalez continues to do well with weight loss even while recovering from surgery and being off her normal routine. Her weight is 256 lb (116.1 kg) today and has had a weight loss of 4 pounds over a period of 3 weeks since her last visit. She has lost 21 lbs since starting treatment with [name].  Insulin Resistance Monique Gonzalez has a diagnosis of insulin resistance based on her elevated fasting insulin level >5. Although Monique Gonzalez's blood glucose readings are still under good control, insulin resistance puts her at greater risk of metabolic syndrome and diabetes. She was given a prescription for metformin but didn't choose to take this and would like to work on diet and exercise more to decrease risk of diabetes.  At risk for diabetes Monique Gonzalez is at higher than average risk for developing diabetes due to her obesity and insulin resistance. She currently denies polyuria or polydipsia.   ALLERGIES: Allergies  Allergen Reactions  . Penicillins Rash and Other (See Comments)    Has patient had a PCN reaction causing immediate rash, facial/tongue/throat swelling, SOB or lightheadedness with hypotension: Unknown Has patient had a PCN reaction causing severe rash involving mucus membranes or skin necrosis: Unknown Has patient had a PCN reaction that required hospitalization: No Has patient had a PCN reaction occurring within the last 10 years: No If all of the above answers are "NO", then may proceed with Cephalosporin use. Childhood reaction.  . Sulfa Antibiotics Rash    MEDICATIONS: Current Outpatient Prescriptions on File Prior to Visit  Medication Sig Dispense Refill  . acetaminophen (TYLENOL)  650 MG CR tablet Take 1,300 mg by mouth at bedtime.    Marland Kitchen acyclovir cream (ZOVIRAX) 5 % Apply 1 application topically every 3 (three) hours. (Patient taking differently: Apply 1 application topically every 3 (three) hours as needed (for cold sores). ) 15 g 3  . aspirin EC 325 MG EC tablet Take 1 tablet (325 mg total) by mouth 2 (two) times daily after a meal. 30 tablet 0  . atorvastatin (LIPITOR) 40 MG tablet Take 1 tablet (40 mg total) by mouth daily. (Patient taking differently: Take 40 mg by mouth at bedtime. ) 90 tablet 3  . buPROPion (WELLBUTRIN SR) 200 MG 12 hr tablet Take 1 tablet (200 mg total) by mouth daily. 30 tablet 0  . cetirizine (ZYRTEC) 10 MG tablet Take 10 mg by mouth at bedtime.    . citalopram (CELEXA) 20 MG tablet Take 1 tablet (20 mg total) by mouth daily. (Patient taking differently: Take 20 mg by mouth at bedtime. ) 90 tablet 3  . cyclobenzaprine (FLEXERIL) 10 MG tablet Take 1 tablet (10 mg total) by mouth 3 (three) times daily as needed for muscle spasms. 60 tablet 0  . fluticasone (FLONASE) 50 MCG/ACT nasal spray Place 2 sprays into both nostrils daily. (Patient taking differently: Place 2 sprays into both nostrils at bedtime. ) 16 g 6  . loratadine (CLARITIN) 10 MG tablet Take 10 mg by mouth at bedtime.     . Multiple Vitamins-Minerals (MULTIVITAMIN,TX-MINERALS) tablet Take 1 tablet by mouth daily. POWDER MIX    . valACYclovir (VALTREX) 1000  MG tablet TAKE 2 TABLETS BY MOUTH EVERY 12 HOURS FOR 2 DOSES AS NEEDED FOR FLARE (Patient taking differently: Take 1,000 mg by mouth every 12 (twelve) hours as needed (for cold sores.). ) 30 tablet 11  . Vitamin D, Ergocalciferol, (DRISDOL) 50000 units CAPS capsule Take 1 capsule (50,000 Units total) by mouth every 7 (seven) days. 4 capsule 0   No current facility-administered medications on file prior to visit.     PAST MEDICAL HISTORY: Past Medical History:  Diagnosis Date  . Allergy   . Chronic pain    back and knee  . Colitis  09/2012   infectious vs inflammatory.   . Colon stricture ([place])   . Depression   . Hyperlipidemia   . Obesity   . Osteoarthritis   . Osteopenia   . Recurrent cold sores   . Seasonal allergies   . Syncope and collapse 11/30/2012   Normal EEG.  Vaso vagal syncope    PAST SURGICAL HISTORY: Past Surgical History:  Procedure Laterality Date  . COLON RESECTION N/A 10/14/2013   Procedure: LAPAROSCOPIC MOBILIZATION OF SPLENIC FLEXURE, LAPAROSCOPIC EXTENDED LEFT COLECTOMY;  Surgeon: [name], MD;  Location: [place];  Service: General;  Laterality: N/A;  . COLONOSCOPY N/A 10/12/2013   Procedure: COLONOSCOPY;  Surgeon: [name], MD;  Location: [facility] ENDOSCOPY;  Service: Endoscopy;  Laterality: N/A;  . DILATION AND CURETTAGE, DIAGNOSTIC / THERAPEUTIC  1987  . OPEN REDUCTION INTERNAL FIXATION (ORIF) DISTAL RADIAL FRACTURE Right 08/08/2014   Procedure: OPEN REDUCTION INTERNAL FIXATION (ORIF) DISTAL RADIAL FRACTURE;  Surgeon: Roseanne Kaufman, MD;  Location: [place];  Service: Orthopedics;  Laterality: Right;  DVR Crosslock, MD available sometime around 1430-1500  . TONSILLECTOMY    . TOTAL KNEE ARTHROPLASTY Right 08/05/2016  . TOTAL KNEE ARTHROPLASTY Right 08/05/2016   Procedure: RIGHT TOTAL KNEE ARTHROPLASTY;  Surgeon: Mcarthur Rossetti, MD;  Location: [place];  Service: Orthopedics;  Laterality: Right;    SOCIAL HISTORY: Social History  Substance Use Topics  . Smoking status: Former Smoker    Quit date: 10/27/1993  . Smokeless tobacco: Never Used  . Alcohol use Yes     Comment: one drink per week    FAMILY HISTORY: Family History  Problem Relation Age of Onset  . Stroke Mother   . Arthritis Mother   . Thyroid disease Mother   . Cancer Father   . Arthritis Sister   . Thyroid disease Sister   . Cancer Maternal Grandmother   . Stroke Maternal Grandfather   . Cancer Maternal Grandfather   . Colon cancer Maternal Grandfather   . Cancer Paternal Grandmother   . Heart disease Paternal  Grandmother   . Colon cancer Paternal Grandmother   . Stroke Paternal Grandfather   . Arthritis Sister   . Obesity Sister   . Sudden death Neg Hx   . Hypertension Neg Hx   . Hyperlipidemia Neg Hx   . Heart attack Neg Hx   . Diabetes Neg Hx   . Rectal cancer Neg Hx   . Stomach cancer Neg Hx   . Esophageal cancer Neg Hx     ROS: Review of Systems  Constitutional: Positive for weight loss.  Genitourinary: Negative for frequency.  Endo/Heme/Allergies: Negative for polydipsia.    PHYSICAL EXAM: Blood pressure 105/72, pulse 89, temperature 97.9 F (36.6 C), temperature source Oral, height 6' (1.829 m), weight 256 lb (116.1 kg), SpO2 97 %. Body mass index is 34.72 kg/m. Physical Exam  Constitutional: She  is oriented to person, place, and time. She appears well-developed and well-nourished.  Cardiovascular: Normal rate.   Pulmonary/Chest: Effort normal.  Musculoskeletal: Normal range of motion.  Neurological: She is oriented to person, place, and time.  Skin: Skin is warm and dry.  Psychiatric: She has a normal mood and affect. Her behavior is normal.  Vitals reviewed.   RECENT LABS AND TESTS: BMET    Component Value Date/Time   NA 136 08/06/2016 0537   NA 142 03/25/2016 1341   K 4.6 08/06/2016 0537   CL 107 08/06/2016 0537   CO2 25 08/06/2016 0537   GLUCOSE 157 (H) 08/06/2016 0537   BUN 20 08/06/2016 0537   BUN 16 03/25/2016 1341   CREATININE 0.87 08/06/2016 0537   CREATININE 0.82 03/20/2015 1420   CALCIUM 8.4 (L) 08/06/2016 0537   GFRNONAA >60 08/06/2016 0537   GFRNONAA 77 03/20/2015 1420   GFRAA >60 08/06/2016 0537   GFRAA 89 03/20/2015 1420   Lab Results  Component Value Date   HGBA1C 5.3 03/25/2016   HGBA1C (H) 09/11/2009    5.7 (NOTE)                                                                       According to the ADA Clinical Practice Recommendations for 2011, when HbA1c is used as a screening test:   >=6.5%   Diagnostic of Diabetes Mellitus            (if abnormal result  is confirmed)  5.7-6.4%   Increased risk of developing Diabetes Mellitus  References:Diagnosis and Classification of Diabetes Mellitus,Diabetes IOEV,0350,09(FGHWE 1):S62-S69 and Standards of Medical Care in         Diabetes - 2011,Diabetes Care,2011,34  (Suppl 1):S11-S61.   Lab Results  Component Value Date   INSULIN 5.5 03/25/2016   CBC    Component Value Date/Time   WBC 9.9 08/06/2016 0641   RBC 3.49 (L) 08/06/2016 0641   HGB 10.0 (L) 08/06/2016 0641   HGB 12.5 03/25/2016 1341   HCT 31.3 (L) 08/06/2016 0641   HCT 37.2 03/25/2016 1341   PLT 189 08/06/2016 0641   MCV 89.7 08/06/2016 0641   MCV 88 03/25/2016 1341   MCH 28.7 08/06/2016 0641   MCHC 31.9 08/06/2016 0641   RDW 13.5 08/06/2016 0641   RDW 14.0 03/25/2016 1341   LYMPHSABS 1.2 03/25/2016 1341   MONOABS 0.5 03/20/2015 1420   EOSABS 0.0 03/25/2016 1341   BASOSABS 0.0 03/25/2016 1341   Iron/TIBC/Ferritin/ %Sat No results found for: IRON, TIBC, FERRITIN, IRONPCTSAT Lipid Panel     Component Value Date/Time   CHOL 172 03/25/2016 1341   TRIG 145 03/25/2016 1341   HDL 59 03/25/2016 1341   CHOLHDL 5.0 03/20/2015 1420   VLDL 35 (H) 03/20/2015 1420   LDLCALC 84 03/25/2016 1341   Hepatic Function Panel     Component Value Date/Time   PROT 6.5 03/25/2016 1341   ALBUMIN 4.2 03/25/2016 1341   AST 16 03/25/2016 1341   ALT 14 03/25/2016 1341   ALKPHOS 72 03/25/2016 1341   BILITOT 0.4 03/25/2016 1341      Component Value Date/Time   TSH 1.420 03/25/2016 1341   TSH 1.530 03/20/2015 1420   TSH 2.427 12/04/2014 0908  ASSESSMENT AND PLAN: Insulin resistance  At risk for diabetes mellitus  Class 1 obesity with serious comorbidity and body mass index (BMI) of 34.0 to 34.9 in adult, unspecified obesity type  PLAN:  Insulin Resistance Lauriel will continue to work on weight loss, exercise, and decreasing simple carbohydrates in her diet to help decrease the risk of diabetes. We dicussed  metformin including benefits and risks. She was informed that eating too many simple carbohydrates or too many calories at one sitting increases the likelihood of GI side effects. Marzetta agreed to discontinue metformin and will follow up with [name] as directed to monitor her progress.  Diabetes risk counselling Weslee was given extended (15 minutes) diabetes prevention counseling today. She is 61 y.o. female and has risk factors for diabetes including obesity and insulin resistance. We discussed intensive lifestyle modifications today with an emphasis on weight loss as well as increasing exercise and decreasing simple carbohydrates in her diet.  Obesity Laruth is currently in the action stage of change. As such, her goal is to continue with weight loss efforts She has agreed to follow the Category 2 plan Jamesia has been instructed to work up to a goal of 150 minutes of combined cardio and strengthening exercise per week or per physical therapist for weight loss and overall health benefits. We discussed the following Behavioral Modification Strategies today: no skipping meals, meal planning & cooking strategies, increasing lean protein intake and decreasing simple carbohydrates   Wynette has agreed to follow up with our clinic in 3 to 4 weeks. She was informed of the importance of frequent follow up visits to maximize her success with intensive lifestyle modifications for her multiple health conditions.  I, Doreene Nest, am acting as transcriptionist for Dennard Nip, MD  I have reviewed the above documentation for accuracy and completeness, and I agree with the above. -Dennard Nip, MD  OBESITY BEHAVIORAL INTERVENTION VISIT  Today's visit was # 12 out of 22.  Starting weight: 277 lbs Starting date: 03/25/16 Today's weight : 256 lbs Today's date: 10/15/2016 Total lbs lost to date: 21 (Patients must lose 7 lbs in the first 6 months to continue with counseling)   ASK: We discussed the diagnosis of obesity  with [name] today and Maudie Mercury agreed to give [name] permission to discuss obesity behavioral modification therapy today.  ASSESS: Shakya has the diagnosis of obesity and her BMI today is 34.8 Ladiamond is in the action stage of change   ADVISE: Stana was educated on the multiple health risks of obesity as well as the benefit of weight loss to improve her health. She was advised of the need for long term treatment and the importance of lifestyle modifications.  AGREE: Multiple dietary modification options and treatment options were discussed and  Charlisha agreed to follow the Category 2 plan We discussed the following Behavioral Modification Strategies today: no skipping meals, meal planning & cooking strategies, increasing lean protein intake and decreasing simple carbohydrates

## 2016-11-04 ENCOUNTER — Telehealth (INDEPENDENT_AMBULATORY_CARE_PROVIDER_SITE_OTHER): Payer: Self-pay | Admitting: *Deleted

## 2016-11-04 NOTE — Telephone Encounter (Signed)
See below guys -- FYI --

## 2016-11-04 NOTE — Telephone Encounter (Signed)
Pt left a message on my vm: Please tell Dr Ninfa Linden and Artis Delay I'm back to work doing great.  My knees doing good.  Walking like a real person!! Big Thank You

## 2016-11-07 ENCOUNTER — Ambulatory Visit (INDEPENDENT_AMBULATORY_CARE_PROVIDER_SITE_OTHER): Payer: 59 | Admitting: Family Medicine

## 2016-11-07 ENCOUNTER — Ambulatory Visit: Payer: 59 | Admitting: Family Medicine

## 2016-11-07 DIAGNOSIS — L600 Ingrowing nail: Secondary | ICD-10-CM | POA: Diagnosis not present

## 2016-11-07 NOTE — Progress Notes (Signed)
Subjective:    [name], female    DOB: [birth year], 61 y.o.   MRN: [id number]  HPI Patient has a chronically ingrown right great toenail. It is ingrown on the medial nail fold. This is been chronic over several months. It will occasionally become erythematous, inflamed, and faster. Today it is relatively calm but it is still painful over the medial nail fold. She would like to remove the ingrown portion of the toenail and she is also requesting phenol matrixectomy/ablation to prevent regrowth of the toenail. Past Medical History:  Diagnosis Date  . Allergy   . Chronic pain    back and knee  . Colitis 09/2012   infectious vs inflammatory.   . Colon stricture ([place])   . Depression   . Hyperlipidemia   . Obesity   . Osteoarthritis   . Osteopenia   . Recurrent cold sores   . Seasonal allergies   . Syncope and collapse 11/30/2012   Normal EEG.  Vaso vagal syncope   Past Surgical History:  Procedure Laterality Date  . COLON RESECTION N/A 10/14/2013   Procedure: LAPAROSCOPIC MOBILIZATION OF SPLENIC FLEXURE, LAPAROSCOPIC EXTENDED LEFT COLECTOMY;  Surgeon: [name], MD;  Location: [place];  Service: General;  Laterality: N/A;  . COLONOSCOPY N/A 10/12/2013   Procedure: COLONOSCOPY;  Surgeon: [name], MD;  Location: [facility] ENDOSCOPY;  Service: Endoscopy;  Laterality: N/A;  . DILATION AND CURETTAGE, DIAGNOSTIC / THERAPEUTIC  1987  . OPEN REDUCTION INTERNAL FIXATION (ORIF) DISTAL RADIAL FRACTURE Right 08/08/2014   Procedure: OPEN REDUCTION INTERNAL FIXATION (ORIF) DISTAL RADIAL FRACTURE;  Surgeon: Roseanne Kaufman, MD;  Location: [place];  Service: Orthopedics;  Laterality: Right;  DVR Crosslock, MD available sometime around 1430-1500  . TONSILLECTOMY    . TOTAL KNEE ARTHROPLASTY Right 08/05/2016  . TOTAL KNEE ARTHROPLASTY Right 08/05/2016   Procedure: RIGHT TOTAL KNEE ARTHROPLASTY;  Surgeon: Mcarthur Rossetti, MD;  Location: [place];  Service: Orthopedics;  Laterality: Right;    Current Outpatient Prescriptions on File Prior to Visit  Medication Sig Dispense Refill  . acetaminophen (TYLENOL) 650 MG CR tablet Take 1,300 mg by mouth at bedtime.    Marland Kitchen acyclovir cream (ZOVIRAX) 5 % Apply 1 application topically every 3 (three) hours. (Patient taking differently: Apply 1 application topically every 3 (three) hours as needed (for cold sores). ) 15 g 3  . aspirin EC 325 MG EC tablet Take 1 tablet (325 mg total) by mouth 2 (two) times daily after a meal. 30 tablet 0  . atorvastatin (LIPITOR) 40 MG tablet Take 1 tablet (40 mg total) by mouth daily. (Patient taking differently: Take 40 mg by mouth at bedtime. ) 90 tablet 3  . buPROPion (WELLBUTRIN SR) 200 MG 12 hr tablet Take 1 tablet (200 mg total) by mouth daily. 30 tablet 0  . cetirizine (ZYRTEC) 10 MG tablet Take 10 mg by mouth at bedtime.    . citalopram (CELEXA) 20 MG tablet Take 1 tablet (20 mg total) by mouth daily. (Patient taking differently: Take 20 mg by mouth at bedtime. ) 90 tablet 3  . cyclobenzaprine (FLEXERIL) 10 MG tablet Take 1 tablet (10 mg total) by mouth 3 (three) times daily as needed for muscle spasms. 60 tablet 0  . fluticasone (FLONASE) 50 MCG/ACT nasal spray Place 2 sprays into both nostrils daily. (Patient taking differently: Place 2 sprays into both nostrils at bedtime. ) 16 g 6  . loratadine (CLARITIN) 10 MG tablet Take 10 mg  by mouth at bedtime.     . Multiple Vitamins-Minerals (MULTIVITAMIN,TX-MINERALS) tablet Take 1 tablet by mouth daily. POWDER MIX    . valACYclovir (VALTREX) 1000 MG tablet TAKE 2 TABLETS BY MOUTH EVERY 12 HOURS FOR 2 DOSES AS NEEDED FOR FLARE (Patient taking differently: Take 1,000 mg by mouth every 12 (twelve) hours as needed (for cold sores.). ) 30 tablet 11  . Vitamin D, Ergocalciferol, (DRISDOL) 50000 units CAPS capsule Take 1 capsule (50,000 Units total) by mouth every 7 (seven) days. 4 capsule 0   No current facility-administered medications on file prior to visit.     Allergies  Allergen Reactions  . Penicillins Rash and Other (See Comments)    Has patient had a PCN reaction causing immediate rash, facial/tongue/throat swelling, SOB or lightheadedness with hypotension: Unknown Has patient had a PCN reaction causing severe rash involving mucus membranes or skin necrosis: Unknown Has patient had a PCN reaction that required hospitalization: No Has patient had a PCN reaction occurring within the last 10 years: No If all of the above answers are "NO", then may proceed with Cephalosporin use. Childhood reaction.  . Sulfa Antibiotics Rash   Social History   Social History  . Marital status: Divorced    Spouse name: N/A  . Number of children: 2  . Years of education: college   Occupational History  . [phone]  [employer]    LP[employer]   Social History Main Topics  . Smoking status: Former Smoker    Quit date: 10/27/1993  . Smokeless tobacco: Never Used  . Alcohol use Yes     Comment: one drink per week  . Drug use: No  . Sexual activity: Not Currently   Other Topics Concern  . Not on file   Social History Narrative  . No narrative on file      Review of Systems  All other systems reviewed and are negative.      Objective:   Physical Exam  Cardiovascular: Normal rate, regular rhythm and normal heart sounds.   Pulmonary/Chest: Effort normal and breath sounds normal. No respiratory distress. She has no wheezes.  Vitals reviewed.  Right great toenail, medial nail margin is ingrown. Surrounding skin is not erythematous but it is tender to palpation.       Assessment & Plan:  Ingrown right big toenail  Digital block with 0.1% lidocaine was used to achieve anesthesia. A tourniquet was then applied to the base of the toe. Using a pair of hemostats, the ingrown portion of the toenail was separated from the underlying nail bed all the way back to the nail matrix. A longitudinal cut was then made in the toenail  using a pair of scissors and the ingrown portion of the toenail was removed with gentle traction using a pair hemostats. Polysporin was then applied to the surrounding skin to prevent chemical burn. A small cotton applicator was then soaked phenol in the coronal applicator was then inserted under the proximal nail fold to apply phenol and ablate the underlying nail matrix. Phenol was allowed to react for approximately 30 seconds and then the chemical reaction was cleansed with copious irrigation using isopropyl alcohol. The nail bed was then covered with Polysporin. The toe was wrapped in petroleum gauze and Coban. Tourniquet was removed. Total tourniquet time was less than 5 minutes. There was no blood loss. Patient tolerated the procedure well without complication. Wound care was discussed.

## 2016-11-12 ENCOUNTER — Ambulatory Visit (INDEPENDENT_AMBULATORY_CARE_PROVIDER_SITE_OTHER): Payer: 59 | Admitting: Family Medicine

## 2016-11-17 ENCOUNTER — Ambulatory Visit (INDEPENDENT_AMBULATORY_CARE_PROVIDER_SITE_OTHER): Payer: 59 | Admitting: Family Medicine

## 2016-11-17 ENCOUNTER — Encounter (INDEPENDENT_AMBULATORY_CARE_PROVIDER_SITE_OTHER): Payer: Self-pay | Admitting: Family Medicine

## 2016-11-17 VITALS — BP 127/84 | HR 76 | Temp 98.3°F | Ht 72.0 in | Wt 263.0 lb

## 2016-11-17 DIAGNOSIS — Z6835 Body mass index (BMI) 35.0-35.9, adult: Secondary | ICD-10-CM | POA: Diagnosis not present

## 2016-11-17 DIAGNOSIS — E669 Obesity, unspecified: Secondary | ICD-10-CM | POA: Diagnosis not present

## 2016-11-17 DIAGNOSIS — E559 Vitamin D deficiency, unspecified: Secondary | ICD-10-CM | POA: Diagnosis not present

## 2016-11-17 DIAGNOSIS — F3289 Other specified depressive episodes: Secondary | ICD-10-CM | POA: Diagnosis not present

## 2016-11-17 NOTE — Telephone Encounter (Signed)
Can you go ahead and r/s and let us know when so we can ask Dr Leafy Ro about refills?   Thanks Loraine Freid

## 2016-11-18 MED ORDER — BUPROPION HCL ER (SR) 200 MG PO TB12: 200.0000 mg | ORAL_TABLET | Freq: Every day | ORAL | 0 refills | Status: DC

## 2016-11-18 MED ORDER — VITAMIN D (ERGOCALCIFEROL) 1.25 MG (50000 UNIT) PO CAPS: 50000.0000 [IU] | ORAL_CAPSULE | ORAL | 0 refills | Status: DC

## 2016-11-18 MED FILL — VIT D2 1.25 MG (50,000 UNIT: 1.25 MG | 28 days supply | Qty: 4 | Fill #0

## 2016-11-18 MED FILL — BUPROPION HCL SR 200 MG TAB: 200 | 30 days supply | Qty: 30 | Fill #0

## 2016-11-18 NOTE — Progress Notes (Signed)
Office: [phone]  /  Fax: [phone]   HPI:   Chief Complaint: OBESITY Monique Gonzalez is here to discuss her progress with her obesity treatment plan. She is on the Category 2 plan and is following her eating plan approximately 50 % of the time. She states she is exercising walking 30 minutes 5 times per week. Monique Gonzalez is retaining fluid with going back to eating simple carbohydrates and temptations have increased. She states she is ready to get back on track.  Her weight is 263 lb (119.3 kg) today and has gained 7 pounds since her last visit. She has lost 14 lbs since starting treatment with [name].  Vitamin D deficiency Monique Gonzalez has a diagnosis of vitamin D deficiency. She is currently stable on prescription Vit D and denies nausea, vomiting or muscle weakness.  Depression with emotional eating behaviors Monique Gonzalez has struggled with emotional eating and using food for comfort to the extent that it is negatively impacting her health. She often snacks when she is not hungry. Monique Gonzalez sometimes feels she is out of control and then feels guilty that she made poor food choices. Monique Gonzalez feels bupropion is helping with emotional eating and her mood in general.  She has been working on behavior modification techniques to help reduce her emotional eating and has been somewhat successful. Her blood pressure is stable, she denies insomnia and she shows no sign of suicidal or homicidal ideations.  Depression screen [facility] 2/9 03/25/2016 11/22/2012  Decreased Interest 3 0  Down, Depressed, Hopeless 3 0  PHQ - 2 Score 6 0  Altered sleeping 3 -  Tired, decreased energy 3 -  Change in appetite 3 -  Feeling bad or failure about yourself  3 -  Trouble concentrating 3 -  Moving slowly or fidgety/restless 3 -  Suicidal thoughts 0 -  PHQ-9 Score 24 -   ALLERGIES: Allergies  Allergen Reactions  . Penicillins Rash and Other (See Comments)    Has patient had a PCN reaction causing immediate rash, facial/tongue/throat swelling, SOB or  lightheadedness with hypotension: Unknown Has patient had a PCN reaction causing severe rash involving mucus membranes or skin necrosis: Unknown Has patient had a PCN reaction that required hospitalization: No Has patient had a PCN reaction occurring within the last 10 years: No If all of the above answers are "NO", then may proceed with Cephalosporin use. Childhood reaction.  . Sulfa Antibiotics Rash    MEDICATIONS: Current Outpatient Prescriptions on File Prior to Visit  Medication Sig Dispense Refill  . acetaminophen (TYLENOL) 650 MG CR tablet Take 1,300 mg by mouth at bedtime.    Monique Gonzalez Kitchen acyclovir cream (ZOVIRAX) 5 % Apply 1 application topically every 3 (three) hours. (Patient taking differently: Apply 1 application topically every 3 (three) hours as needed (for cold sores). ) 15 g 3  . aspirin EC 325 MG EC tablet Take 1 tablet (325 mg total) by mouth 2 (two) times daily after a meal. 30 tablet 0  . atorvastatin (LIPITOR) 40 MG tablet Take 1 tablet (40 mg total) by mouth daily. (Patient taking differently: Take 40 mg by mouth at bedtime. ) 90 tablet 3  . buPROPion (WELLBUTRIN SR) 200 MG 12 hr tablet Take 1 tablet (200 mg total) by mouth daily. 30 tablet 0  . cetirizine (ZYRTEC) 10 MG tablet Take 10 mg by mouth at bedtime.    . citalopram (CELEXA) 20 MG tablet Take 1 tablet (20 mg total) by mouth daily. (Patient taking differently: Take 20 mg by mouth  at bedtime. ) 90 tablet 3  . cyclobenzaprine (FLEXERIL) 10 MG tablet Take 1 tablet (10 mg total) by mouth 3 (three) times daily as needed for muscle spasms. 60 tablet 0  . fluticasone (FLONASE) 50 MCG/ACT nasal spray Place 2 sprays into both nostrils daily. (Patient taking differently: Place 2 sprays into both nostrils at bedtime. ) 16 g 6  . loratadine (CLARITIN) 10 MG tablet Take 10 mg by mouth at bedtime.     . Multiple Vitamins-Minerals (MULTIVITAMIN,TX-MINERALS) tablet Take 1 tablet by mouth daily. POWDER MIX    . valACYclovir (VALTREX) 1000  MG tablet TAKE 2 TABLETS BY MOUTH EVERY 12 HOURS FOR 2 DOSES AS NEEDED FOR FLARE (Patient taking differently: Take 1,000 mg by mouth every 12 (twelve) hours as needed (for cold sores.). ) 30 tablet 11  . Vitamin D, Ergocalciferol, (DRISDOL) 50000 units CAPS capsule Take 1 capsule (50,000 Units total) by mouth every 7 (seven) days. 4 capsule 0   No current facility-administered medications on file prior to visit.     PAST MEDICAL HISTORY: Past Medical History:  Diagnosis Date  . Allergy   . Chronic pain    back and knee  . Colitis 09/2012   infectious vs inflammatory.   . Colon stricture ([place])   . Depression   . Hyperlipidemia   . Obesity   . Osteoarthritis   . Osteopenia   . Recurrent cold sores   . Seasonal allergies   . Syncope and collapse 11/30/2012   Normal EEG.  Vaso vagal syncope    PAST SURGICAL HISTORY: Past Surgical History:  Procedure Laterality Date  . COLON RESECTION N/A 10/14/2013   Procedure: LAPAROSCOPIC MOBILIZATION OF SPLENIC FLEXURE, LAPAROSCOPIC EXTENDED LEFT COLECTOMY;  Surgeon: [name], MD;  Location: [place];  Service: General;  Laterality: N/A;  . COLONOSCOPY N/A 10/12/2013   Procedure: COLONOSCOPY;  Surgeon: [name], MD;  Location: [facility] ENDOSCOPY;  Service: Endoscopy;  Laterality: N/A;  . DILATION AND CURETTAGE, DIAGNOSTIC / THERAPEUTIC  1987  . OPEN REDUCTION INTERNAL FIXATION (ORIF) DISTAL RADIAL FRACTURE Right 08/08/2014   Procedure: OPEN REDUCTION INTERNAL FIXATION (ORIF) DISTAL RADIAL FRACTURE;  Surgeon: Roseanne Kaufman, MD;  Location: [place];  Service: Orthopedics;  Laterality: Right;  DVR Crosslock, MD available sometime around 1430-1500  . TONSILLECTOMY    . TOTAL KNEE ARTHROPLASTY Right 08/05/2016  . TOTAL KNEE ARTHROPLASTY Right 08/05/2016   Procedure: RIGHT TOTAL KNEE ARTHROPLASTY;  Surgeon: Mcarthur Rossetti, MD;  Location: [place];  Service: Orthopedics;  Laterality: Right;    SOCIAL HISTORY: Social History  Substance Use Topics  .  Smoking status: Former Smoker    Quit date: 10/27/1993  . Smokeless tobacco: Never Used  . Alcohol use Yes     Comment: one drink per week    FAMILY HISTORY: Family History  Problem Relation Age of Onset  . Stroke Mother   . Arthritis Mother   . Thyroid disease Mother   . Cancer Father   . Arthritis Sister   . Thyroid disease Sister   . Cancer Maternal Grandmother   . Stroke Maternal Grandfather   . Cancer Maternal Grandfather   . Colon cancer Maternal Grandfather   . Cancer Paternal Grandmother   . Heart disease Paternal Grandmother   . Colon cancer Paternal Grandmother   . Stroke Paternal Grandfather   . Arthritis Sister   . Obesity Sister   . Sudden death Neg Hx   . Hypertension Neg Hx   . Hyperlipidemia  Neg Hx   . Heart attack Neg Hx   . Diabetes Neg Hx   . Rectal cancer Neg Hx   . Stomach cancer Neg Hx   . Esophageal cancer Neg Hx     ROS: Review of Systems  Constitutional: Negative for weight loss.  Gastrointestinal: Negative for nausea and vomiting.  Musculoskeletal:       Negative muscle weakness  Psychiatric/Behavioral: Positive for depression. Negative for suicidal ideas. The patient does not have insomnia.     PHYSICAL EXAM: Blood pressure 127/84, pulse 76, temperature 98.3 F (36.8 C), temperature source Oral, height 6' (1.829 m), weight 263 lb (119.3 kg), SpO2 98 %. Body mass index is 35.67 kg/m. Physical Exam  Constitutional: She is oriented to person, place, and time. She appears well-developed and well-nourished.  Cardiovascular: Normal rate.   Pulmonary/Chest: Effort normal.  Musculoskeletal: Normal range of motion.  Neurological: She is oriented to person, place, and time.  Skin: Skin is warm and dry.  Psychiatric: She has a normal mood and affect. Her behavior is normal.  Vitals reviewed.   RECENT LABS AND TESTS: BMET    Component Value Date/Time   NA 136 08/06/2016 0537   NA 142 03/25/2016 1341   K 4.6 08/06/2016 0537   CL 107  08/06/2016 0537   CO2 25 08/06/2016 0537   GLUCOSE 157 (H) 08/06/2016 0537   BUN 20 08/06/2016 0537   BUN 16 03/25/2016 1341   CREATININE 0.87 08/06/2016 0537   CREATININE 0.82 03/20/2015 1420   CALCIUM 8.4 (L) 08/06/2016 0537   GFRNONAA >60 08/06/2016 0537   GFRNONAA 77 03/20/2015 1420   GFRAA >60 08/06/2016 0537   GFRAA 89 03/20/2015 1420   Lab Results  Component Value Date   HGBA1C 5.3 03/25/2016   HGBA1C (H) 09/11/2009    5.7 (NOTE)                                                                       According to the ADA Clinical Practice Recommendations for 2011, when HbA1c is used as a screening test:   >=6.5%   Diagnostic of Diabetes Mellitus           (if abnormal result  is confirmed)  5.7-6.4%   Increased risk of developing Diabetes Mellitus  References:Diagnosis and Classification of Diabetes Mellitus,Diabetes NKNL,9767,34(LPFXT 1):S62-S69 and Standards of Medical Care in         Diabetes - 2011,Diabetes Care,2011,34  (Suppl 1):S11-S61.   Lab Results  Component Value Date   INSULIN 5.5 03/25/2016   CBC    Component Value Date/Time   WBC 9.9 08/06/2016 0641   RBC 3.49 (L) 08/06/2016 0641   HGB 10.0 (L) 08/06/2016 0641   HGB 12.5 03/25/2016 1341   HCT 31.3 (L) 08/06/2016 0641   HCT 37.2 03/25/2016 1341   PLT 189 08/06/2016 0641   MCV 89.7 08/06/2016 0641   MCV 88 03/25/2016 1341   MCH 28.7 08/06/2016 0641   MCHC 31.9 08/06/2016 0641   RDW 13.5 08/06/2016 0641   RDW 14.0 03/25/2016 1341   LYMPHSABS 1.2 03/25/2016 1341   MONOABS 0.5 03/20/2015 1420   EOSABS 0.0 03/25/2016 1341   BASOSABS 0.0 03/25/2016 1341   Iron/TIBC/Ferritin/ %Sat No results found  for: IRON, TIBC, FERRITIN, IRONPCTSAT Lipid Panel     Component Value Date/Time   CHOL 172 03/25/2016 1341   TRIG 145 03/25/2016 1341   HDL 59 03/25/2016 1341   CHOLHDL 5.0 03/20/2015 1420   VLDL 35 (H) 03/20/2015 1420   LDLCALC 84 03/25/2016 1341   Hepatic Function Panel     Component Value Date/Time     PROT 6.5 03/25/2016 1341   ALBUMIN 4.2 03/25/2016 1341   AST 16 03/25/2016 1341   ALT 14 03/25/2016 1341   ALKPHOS 72 03/25/2016 1341   BILITOT 0.4 03/25/2016 1341      Component Value Date/Time   TSH 1.420 03/25/2016 1341   TSH 1.530 03/20/2015 1420   TSH 2.427 12/04/2014 0908    ASSESSMENT AND PLAN: Vitamin D deficiency - Plan: Vitamin D, Ergocalciferol, (DRISDOL) 50000 units CAPS capsule  Other depression - Plan: buPROPion (WELLBUTRIN SR) 200 MG 12 hr tablet  Class 2 obesity without serious comorbidity with body mass index (BMI) of 35.0 to 35.9 in adult, unspecified obesity type  PLAN:  Vitamin D Deficiency Monique Gonzalez was informed that low vitamin D levels contributes to fatigue and are associated with obesity, breast, and colon cancer. She agrees to continue taking prescription Vit D @50 ,000 IU every week and we will refill for 1 month and she will follow up for routine testing of vitamin D, at least 2-3 times per year. She was informed of the risk of over-replacement of vitamin D and agrees to not increase her dose unless he discusses this with [name] first. Monique Gonzalez agrees to follow up with our clinic in 2 to 3 weeks.  Depression with Emotional Eating Behaviors We discussed behavior modification techniques today to help Monique Gonzalez deal with her emotional eating and depression. She has agrees to continue taking bupropion 200 mg qd and we will refill for 1 month and Monique Gonzalez agrees to follow up with our clinic in 2 to 3 weeks.  Obesity Monique Gonzalez is currently in the action stage of change. As such, her goal is to continue with weight loss efforts She has agreed to follow the [place] has been instructed to work up to a goal of 150 minutes of combined cardio and strengthening exercise per week for weight loss and overall health benefits. We discussed the following Behavioral Modification Strategies today: increasing lean protein intake, decreasing simple carbohydrates, increase H20 intake, decreasing  sodium intake, and decrease eating out   Monique Gonzalez has agreed to follow up with our clinic in 2 to 3 weeks. She was informed of the importance of frequent follow up visits to maximize her success with intensive lifestyle modifications for her multiple health conditions.  I, Monique Gonzalez, am acting as transcriptionist for Monique Nip, MD  I have reviewed the above documentation for accuracy and completeness, and I agree with the above. -Monique Nip, MD     OBESITY BEHAVIORAL INTERVENTION VISIT  Today's visit was # 13 out of 22.  Starting weight: 277 lbs Starting date: 03/25/16 Today's weight: 263 lbs Today's date: 11/17/16 Total lbs lost to date: 14 (Patients must lose 7 lbs in the first 6 months to continue with counseling)   ASK: We discussed the diagnosis of obesity with Monique Gonzalez today and Monique Gonzalez agreed to give [name] permission to discuss obesity behavioral modification therapy today.  ASSESS: Monique Gonzalez has the diagnosis of obesity and her BMI today is 64 Monique Gonzalez is in the action stage of change   ADVISE: Monique Gonzalez was educated on the  multiple health risks of obesity as well as the benefit of weight loss to improve her health. She was advised of the need for long term treatment and the importance of lifestyle modifications.  AGREE: Multiple dietary modification options and treatment options were discussed and  Monique Gonzalez agreed to follow the Low Carb plan We discussed the following Behavioral Modification Strategies today: increasing lean protein intake, decreasing simple carbohydrates, increase H20 intake, decreasing sodium intake, and decrease eating out

## 2016-12-02 ENCOUNTER — Ambulatory Visit (INDEPENDENT_AMBULATORY_CARE_PROVIDER_SITE_OTHER): Payer: 59 | Admitting: Physician Assistant

## 2016-12-03 ENCOUNTER — Ambulatory Visit (INDEPENDENT_AMBULATORY_CARE_PROVIDER_SITE_OTHER): Payer: 59 | Admitting: Family Medicine

## 2016-12-03 VITALS — BP 116/75 | HR 67 | Temp 98.1°F | Ht 72.0 in | Wt 258.0 lb

## 2016-12-03 DIAGNOSIS — E8881 Metabolic syndrome: Secondary | ICD-10-CM

## 2016-12-03 DIAGNOSIS — E7849 Other hyperlipidemia: Secondary | ICD-10-CM

## 2016-12-03 DIAGNOSIS — Z9189 Other specified personal risk factors, not elsewhere classified: Secondary | ICD-10-CM | POA: Diagnosis not present

## 2016-12-03 DIAGNOSIS — Z6835 Body mass index (BMI) 35.0-35.9, adult: Secondary | ICD-10-CM | POA: Diagnosis not present

## 2016-12-03 DIAGNOSIS — E669 Obesity, unspecified: Secondary | ICD-10-CM

## 2016-12-03 DIAGNOSIS — D508 Other iron deficiency anemias: Secondary | ICD-10-CM

## 2016-12-03 DIAGNOSIS — E784 Other hyperlipidemia: Secondary | ICD-10-CM

## 2016-12-03 DIAGNOSIS — E559 Vitamin D deficiency, unspecified: Secondary | ICD-10-CM | POA: Diagnosis not present

## 2016-12-03 DIAGNOSIS — IMO0001 Reserved for inherently not codable concepts without codable children: Secondary | ICD-10-CM

## 2016-12-04 NOTE — Progress Notes (Signed)
Office: [phone]  /  Fax: [phone]   HPI:   Chief Complaint: OBESITY Monique Gonzalez is here to discuss her progress with her obesity treatment plan. She is on the lower carbohydrate, vegetable and lean protein rich diet plan and is following her eating plan approximately 85 % of the time. She states she is exercising 0 minutes 0 times per week. Analisse continues to do well with weight loss on the low carbohydrate plan. 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 Hunger is mostly controlled. Her weight is 258 lb (117 kg) today and has had a weight loss of 5 pounds over a period of 2 weeks since her last visit. She has lost 19 lbs since starting treatment with [name].  Vitamin D deficiency Monique Gonzalez has a diagnosis of vitamin D deficiency. She is currently taking vit D and is due for labs. She denies nausea, vomiting or muscle weakness.  Iron Deficiency Anemia Monique Gonzalez has a diagnosis of anemia. Iron level has not been checked since her last surgery. She is not on iron supplementation. Her last colonoscopy was in 2015.   Hyperlipidemia Monique Gonzalez has hyperlipidemia and is currently taking Lipitor. She has been trying to improve her cholesterol levels with intensive lifestyle modification including a low saturated fat diet, exercise and weight loss. She denies any chest pain, claudication or myalgias.  At risk for cardiovascular disease Monique Gonzalez is at a higher than average risk for cardiovascular disease due to obesity and hyperlipidemia. She currently denies any chest pain.  Insulin Resistance Monique Gonzalez has a diagnosis of insulin resistance based on her elevated fasting insulin level >5. Although Monique Gonzalez's blood glucose readings are still under good control, insulin resistance puts her at greater risk of metabolic syndrome and diabetes. She is not taking metformin currently and continues to work on diet and exercise to decrease risk of diabetes.  ALLERGIES: Allergies  Allergen Reactions  . Penicillins Rash and Other  (See Comments)    Has patient had a PCN reaction causing immediate rash, facial/tongue/throat swelling, SOB or lightheadedness with hypotension: Unknown Has patient had a PCN reaction causing severe rash involving mucus membranes or skin necrosis: Unknown Has patient had a PCN reaction that required hospitalization: No Has patient had a PCN reaction occurring within the last 10 years: No If all of the above answers are "NO", then may proceed with Cephalosporin use. Childhood reaction.  . Sulfa Antibiotics Rash    MEDICATIONS: Current Outpatient Prescriptions on File Prior to Visit  Medication Sig Dispense Refill  . acetaminophen (TYLENOL) 650 MG CR tablet Take 1,300 mg by mouth at bedtime.    Marland Kitchen acyclovir cream (ZOVIRAX) 5 % Apply 1 application topically every 3 (three) hours. (Patient taking differently: Apply 1 application topically every 3 (three) hours as needed (for cold sores). ) 15 g 3  . aspirin EC 325 MG EC tablet Take 1 tablet (325 mg total) by mouth 2 (two) times daily after a meal. 30 tablet 0  . atorvastatin (LIPITOR) 40 MG tablet Take 1 tablet (40 mg total) by mouth daily. (Patient taking differently: Take 40 mg by mouth at bedtime. ) 90 tablet 3  . buPROPion (WELLBUTRIN SR) 200 MG 12 hr tablet Take 1 tablet (200 mg total) by mouth daily. 30 tablet 0  . cetirizine (ZYRTEC) 10 MG tablet Take 10 mg by mouth at bedtime.    . citalopram (CELEXA) 20 MG tablet Take 1 tablet (20 mg total) by mouth daily. (Patient taking differently: Take 20 mg by  mouth at bedtime. ) 90 tablet 3  . cyclobenzaprine (FLEXERIL) 10 MG tablet Take 1 tablet (10 mg total) by mouth 3 (three) times daily as needed for muscle spasms. 60 tablet 0  . fluticasone (FLONASE) 50 MCG/ACT nasal spray Place 2 sprays into both nostrils daily. (Patient taking differently: Place 2 sprays into both nostrils daily as needed. ) 16 g 6  . loratadine (CLARITIN) 10 MG tablet Take 10 mg by mouth at bedtime.     . Multiple  Vitamins-Minerals (MULTIVITAMIN,TX-MINERALS) tablet Take 1 tablet by mouth daily. POWDER MIX    . valACYclovir (VALTREX) 1000 MG tablet TAKE 2 TABLETS BY MOUTH EVERY 12 HOURS FOR 2 DOSES AS NEEDED FOR FLARE (Patient taking differently: Take 1,000 mg by mouth every 12 (twelve) hours as needed (for cold sores.). ) 30 tablet 11  . Vitamin D, Ergocalciferol, (DRISDOL) 50000 units CAPS capsule Take 1 capsule (50,000 Units total) by mouth every 7 (seven) days. 4 capsule 0   No current facility-administered medications on file prior to visit.     PAST MEDICAL HISTORY: Past Medical History:  Diagnosis Date  . Allergy   . Chronic pain    back and knee  . Colitis 09/2012   infectious vs inflammatory.   . Colon stricture ([place])   . Depression   . Hyperlipidemia   . Obesity   . Osteoarthritis   . Osteopenia   . Recurrent cold sores   . Seasonal allergies   . Syncope and collapse 11/30/2012   Normal EEG.  Vaso vagal syncope    PAST SURGICAL HISTORY: Past Surgical History:  Procedure Laterality Date  . COLON RESECTION N/A 10/14/2013   Procedure: LAPAROSCOPIC MOBILIZATION OF SPLENIC FLEXURE, LAPAROSCOPIC EXTENDED LEFT COLECTOMY;  Surgeon: [name], MD;  Location: [facility];  Service: General;  Laterality: N/A;  . COLONOSCOPY N/A 10/12/2013   Procedure: COLONOSCOPY;  Surgeon: [name], MD;  Location: [facility] ENDOSCOPY;  Service: Endoscopy;  Laterality: N/A;  . DILATION AND CURETTAGE, DIAGNOSTIC / THERAPEUTIC  1987  . OPEN REDUCTION INTERNAL FIXATION (ORIF) DISTAL RADIAL FRACTURE Right 08/08/2014   Procedure: OPEN REDUCTION INTERNAL FIXATION (ORIF) DISTAL RADIAL FRACTURE;  Surgeon: Roseanne Kaufman, MD;  Location: [facility];  Service: Orthopedics;  Laterality: Right;  DVR Crosslock, MD available sometime around 1430-1500  . TONSILLECTOMY    . TOTAL KNEE ARTHROPLASTY Right 08/05/2016  . TOTAL KNEE ARTHROPLASTY Right 08/05/2016   Procedure: RIGHT TOTAL KNEE ARTHROPLASTY;  Surgeon: Mcarthur Rossetti, MD;   Location: [facility];  Service: Orthopedics;  Laterality: Right;    SOCIAL HISTORY: Social History  Substance Use Topics  . Smoking status: Former Smoker    Quit date: 10/27/1993  . Smokeless tobacco: Never Used  . Alcohol use Yes     Comment: one drink per week    FAMILY HISTORY: Family History  Problem Relation Age of Onset  . Stroke Mother   . Arthritis Mother   . Thyroid disease Mother   . Cancer Father   . Arthritis Sister   . Thyroid disease Sister   . Cancer Maternal Grandmother   . Stroke Maternal Grandfather   . Cancer Maternal Grandfather   . Colon cancer Maternal Grandfather   . Cancer Paternal Grandmother   . Heart disease Paternal Grandmother   . Colon cancer Paternal Grandmother   . Stroke Paternal Grandfather   . Arthritis Sister   . Obesity Sister   . Sudden death Neg Hx   . Hypertension Neg Hx   .  Hyperlipidemia Neg Hx   . Heart attack Neg Hx   . Diabetes Neg Hx   . Rectal cancer Neg Hx   . Stomach cancer Neg Hx   . Esophageal cancer Neg Hx     ROS: Review of Systems  Constitutional: Positive for weight loss.  Cardiovascular: Negative for chest pain and claudication.  Gastrointestinal: Negative for nausea and vomiting.  Musculoskeletal: Negative for myalgias.       Negative muscle weakness    PHYSICAL EXAM: Blood pressure 116/75, pulse 67, temperature 98.1 F (36.7 C), temperature source Oral, height 6' (1.829 m), weight 258 lb (117 kg), SpO2 97 %. Body mass index is 34.99 kg/m. Physical Exam  Constitutional: She is oriented to person, place, and time. She appears well-developed and well-nourished.  Cardiovascular: Normal rate.   Pulmonary/Chest: Effort normal.  Musculoskeletal: Normal range of motion.  Neurological: She is oriented to person, place, and time.  Skin: Skin is warm and dry.  Psychiatric: She has a normal mood and affect. Her behavior is normal.  Vitals reviewed.   RECENT LABS AND TESTS: BMET    Component Value Date/Time     NA 136 08/06/2016 0537   NA 142 03/25/2016 1341   K 4.6 08/06/2016 0537   CL 107 08/06/2016 0537   CO2 25 08/06/2016 0537   GLUCOSE 157 (H) 08/06/2016 0537   BUN 20 08/06/2016 0537   BUN 16 03/25/2016 1341   CREATININE 0.87 08/06/2016 0537   CREATININE 0.82 03/20/2015 1420   CALCIUM 8.4 (L) 08/06/2016 0537   GFRNONAA >60 08/06/2016 0537   GFRNONAA 77 03/20/2015 1420   GFRAA >60 08/06/2016 0537   GFRAA 89 03/20/2015 1420   Lab Results  Component Value Date   HGBA1C 5.3 03/25/2016   HGBA1C (H) 09/11/2009    5.7 (NOTE)                                                                       According to the ADA Clinical Practice Recommendations for 2011, when HbA1c is used as a screening test:   >=6.5%   Diagnostic of Diabetes Mellitus           (if abnormal result  is confirmed)  5.7-6.4%   Increased risk of developing Diabetes Mellitus  References:Diagnosis and Classification of Diabetes Mellitus,Diabetes DPOE,4235,36(RWERX 1):S62-S69 and Standards of Medical Care in         Diabetes - 2011,Diabetes Care,2011,34  (Suppl 1):S11-S61.   Lab Results  Component Value Date   INSULIN 5.5 03/25/2016   CBC    Component Value Date/Time   WBC 9.9 08/06/2016 0641   RBC 3.49 (L) 08/06/2016 0641   HGB 10.0 (L) 08/06/2016 0641   HGB 12.5 03/25/2016 1341   HCT 31.3 (L) 08/06/2016 0641   HCT 37.2 03/25/2016 1341   PLT 189 08/06/2016 0641   MCV 89.7 08/06/2016 0641   MCV 88 03/25/2016 1341   MCH 28.7 08/06/2016 0641   MCHC 31.9 08/06/2016 0641   RDW 13.5 08/06/2016 0641   RDW 14.0 03/25/2016 1341   LYMPHSABS 1.2 03/25/2016 1341   MONOABS 0.5 03/20/2015 1420   EOSABS 0.0 03/25/2016 1341   BASOSABS 0.0 03/25/2016 1341   Iron/TIBC/Ferritin/ %Sat No results found for: IRON, TIBC,  FERRITIN, IRONPCTSAT Lipid Panel     Component Value Date/Time   CHOL 172 03/25/2016 1341   TRIG 145 03/25/2016 1341   HDL 59 03/25/2016 1341   CHOLHDL 5.0 03/20/2015 1420   VLDL 35 (H) 03/20/2015 1420    LDLCALC 84 03/25/2016 1341   Hepatic Function Panel     Component Value Date/Time   PROT 6.5 03/25/2016 1341   ALBUMIN 4.2 03/25/2016 1341   AST 16 03/25/2016 1341   ALT 14 03/25/2016 1341   ALKPHOS 72 03/25/2016 1341   BILITOT 0.4 03/25/2016 1341      Component Value Date/Time   TSH 1.420 03/25/2016 1341   TSH 1.530 03/20/2015 1420   TSH 2.427 12/04/2014 0908    ASSESSMENT AND PLAN: Other iron deficiency anemia  Vitamin D deficiency - Plan: VITAMIN D 25 Hydroxy (Vit-D Deficiency, Fractures)  Other hyperlipidemia - Plan: Lipid Panel With LDL/HDL Ratio  Insulin resistance - Plan: Comprehensive metabolic panel, CBC With Differential, Hemoglobin A1c, Insulin, random  At risk for heart disease  Class 2 obesity with serious comorbidity and body mass index (BMI) of 35.0 to 35.9 in adult, unspecified obesity type  PLAN:  Vitamin D Deficiency Monique Gonzalez was informed that low vitamin D levels contributes to fatigue and are associated with obesity, breast, and colon cancer. She agrees to continue to take prescription Vit D @50 ,000 IU every week and we will check labs and will follow up for routine testing of vitamin D, at least 2-3 times per year. She was informed of the risk of over-replacement of vitamin D and agrees to not increase her dose unless he discusses this with [name] first.  Iron Deficiency Anemia The diagnosis of Iron deficiency anemia was discussed with Monique Gonzalez and was explained in detail. She was given suggestions of iron rich foods and iron supplement was not prescribed. We will check labs and Monique Gonzalez agrees to follow up with our clinic in 2 weeks.  Hyperlipidemia Monique Gonzalez was informed of the American Heart Association Guidelines emphasizing intensive lifestyle modifications as the first line treatment for hyperlipidemia. We discussed many lifestyle modifications today in depth, and Monique Gonzalez will continue to work on decreasing saturated fats such as fatty red meat, butter and many fried foods. She  will also increase vegetables and lean protein in her diet and continue to work on exercise and weight loss efforts. Monique Gonzalez is attempting to control her hyperlipidemia with diet, exercise and weight loss. We will check labs and Monique Gonzalez agrees to continue her medications as prescribed and will follow up with our clinic in 2 weeks.  Cardiovascular risk counseling Monique Gonzalez was given extended (15 minutes) coronary artery disease prevention counseling today. She is 61 y.o. female and has risk factors for heart disease including obesity and hyperlipidemia. We discussed intensive lifestyle modifications today with an emphasis on specific weight loss instructions and strategies. Pt was also informed of the importance of increasing exercise and decreasing saturated fats to help prevent heart disease.  Insulin Resistance Monique Gonzalez will continue to work on weight loss, exercise, and decreasing simple carbohydrates in her diet to help decrease the risk of diabetes. She was informed that eating too many simple carbohydrates or too many calories at one sitting increases the likelihood of GI side effects. We will check labs and Monique Gonzalez agreed to follow up with [name] as directed to monitor her progress.  Obesity Monique Gonzalez is currently in the action stage of change. As such, her goal is to continue with weight loss efforts She has agreed to  follow a lower carbohydrate, vegetable and lean protein rich diet plan Monique Gonzalez has been instructed to work up to a goal of 150 minutes of combined cardio and strengthening exercise per week for weight loss and overall health benefits. We discussed the following Behavioral Modification Strategies today: increasing lean protein intake, decreasing simple carbohydrates and dealing with coworker sabotage  Monique Gonzalez has agreed to follow up with our clinic in 2 weeks. She was informed of the importance of frequent follow up visits to maximize her success with intensive lifestyle modifications for her multiple health  conditions.  I, Doreene Nest, am acting as transcriptionist for Dennard Nip, MD  I have reviewed the above documentation for accuracy and completeness, and I agree with the above. -Dennard Nip, MD   OBESITY BEHAVIORAL INTERVENTION VISIT  Today's visit was # 14 out of 22.  Starting weight: 277 lbs Starting date: 03/25/16 Today's weight : 258 lbs  Today's date: 12/03/2016 Total lbs lost to date: 22 (Patients must lose 7 lbs in the first 6 months to continue with counseling)   ASK: We discussed the diagnosis of obesity with [name] today and Monique Gonzalez agreed to give [name] permission to discuss obesity behavioral modification therapy today.  ASSESS: Genavieve has the diagnosis of obesity and her BMI today is 34.98 Jakari is in the action stage of change   ADVISE: Maanasa was educated on the multiple health risks of obesity as well as the benefit of weight loss to improve her health. She was advised of the need for long term treatment and the importance of lifestyle modifications.  AGREE: Multiple dietary modification options and treatment options were discussed and  Siera agreed to follow a lower carbohydrate, vegetable and lean protein rich diet plan We discussed the following Behavioral Modification Strategies today: increasing lean protein intake, decreasing simple carbohydrates and dealing with coworker sabotage

## 2016-12-11 ENCOUNTER — Other Ambulatory Visit: Payer: 59

## 2016-12-11 DIAGNOSIS — E7849 Other hyperlipidemia: Secondary | ICD-10-CM | POA: Diagnosis not present

## 2016-12-11 DIAGNOSIS — E8881 Metabolic syndrome: Secondary | ICD-10-CM

## 2016-12-11 DIAGNOSIS — D508 Other iron deficiency anemias: Secondary | ICD-10-CM

## 2016-12-11 DIAGNOSIS — E559 Vitamin D deficiency, unspecified: Secondary | ICD-10-CM

## 2016-12-11 DIAGNOSIS — Z1159 Encounter for screening for other viral diseases: Secondary | ICD-10-CM

## 2016-12-11 DIAGNOSIS — Z79899 Other long term (current) drug therapy: Secondary | ICD-10-CM

## 2016-12-11 DIAGNOSIS — Z9189 Other specified personal risk factors, not elsewhere classified: Secondary | ICD-10-CM

## 2016-12-12 LAB — HEPATITIS C ANTIBODY
HEP C AB: NONREACTIVE
SIGNAL TO CUT-OFF: 0.01 (ref <1.00)

## 2016-12-15 ENCOUNTER — Other Ambulatory Visit: Payer: 59

## 2016-12-15 LAB — COMPREHENSIVE METABOLIC PANEL
AG Ratio: 1.7 (calc) (ref 1.0–2.5)
ALBUMIN MSPROF: 3.9 g/dL (ref 3.6–5.1)
ALKALINE PHOSPHATASE (APISO): 63 U/L (ref 33–130)
ALT: 14 U/L (ref 6–29)
AST: 15 U/L (ref 10–35)
BILIRUBIN TOTAL: 0.5 mg/dL (ref 0.2–1.2)
BUN/Creatinine Ratio: 30 (calc) — ABNORMAL HIGH (ref 6–22)
BUN: 26 mg/dL — AB (ref 7–25)
CO2: 26 mmol/L (ref 20–32)
CREATININE: 0.87 mg/dL (ref 0.50–0.99)
Calcium: 9.4 mg/dL (ref 8.6–10.4)
Chloride: 104 mmol/L (ref 98–110)
Globulin: 2.3 g/dL (calc) (ref 1.9–3.7)
Glucose, Bld: 92 mg/dL (ref 65–99)
POTASSIUM: 5.1 mmol/L (ref 3.5–5.3)
Sodium: 139 mmol/L (ref 135–146)
Total Protein: 6.2 g/dL (ref 6.1–8.1)

## 2016-12-15 LAB — CBC WITH DIFFERENTIAL/PLATELET
BASOS ABS: 30 {cells}/uL (ref 0–200)
Basophils Relative: 0.6 %
Eosinophils Absolute: 30 cells/uL (ref 15–500)
Eosinophils Relative: 0.6 %
HEMATOCRIT: 36.2 % (ref 35.0–45.0)
Hemoglobin: 12 g/dL (ref 11.7–15.5)
Lymphs Abs: 1215 cells/uL (ref 850–3900)
MCH: 28.2 pg (ref 27.0–33.0)
MCHC: 33.1 g/dL (ref 32.0–36.0)
MCV: 85 fL (ref 80.0–100.0)
MPV: 9.7 fL (ref 7.5–12.5)
Monocytes Relative: 10.2 %
NEUTROS PCT: 64.3 %
Neutro Abs: 3215 cells/uL (ref 1500–7800)
PLATELETS: 218 10*3/uL (ref 140–400)
RBC: 4.26 10*6/uL (ref 3.80–5.10)
RDW: 14.5 % (ref 11.0–15.0)
TOTAL LYMPHOCYTE: 24.3 %
WBC: 5 10*3/uL (ref 3.8–10.8)
WBCMIX: 510 {cells}/uL (ref 200–950)

## 2016-12-15 LAB — HEMOGLOBIN A1C
EAG (MMOL/L): 5.7 (calc)
HEMOGLOBIN A1C: 5.2 %{Hb} (ref <5.7)
Mean Plasma Glucose: 103 (calc)

## 2016-12-15 LAB — LIPID PANEL
CHOL/HDL RATIO: 3.3 (calc) (ref <5.0)
CHOLESTEROL: 189 mg/dL (ref <200)
HDL: 57 mg/dL (ref >50)
LDL CHOLESTEROL (CALC): 109 mg/dL — AB
Non-HDL Cholesterol (Calc): 132 mg/dL (calc) — ABNORMAL HIGH (ref <130)
TRIGLYCERIDES: 120 mg/dL (ref <150)

## 2016-12-15 LAB — INSULIN, RANDOM

## 2016-12-15 LAB — VITAMIN D 25 HYDROXY (VIT D DEFICIENCY, FRACTURES): VIT D 25 HYDROXY: 60 ng/mL (ref 30–100)

## 2016-12-16 ENCOUNTER — Other Ambulatory Visit: Payer: 59

## 2016-12-16 DIAGNOSIS — E8881 Metabolic syndrome: Secondary | ICD-10-CM | POA: Diagnosis not present

## 2016-12-17 ENCOUNTER — Ambulatory Visit (INDEPENDENT_AMBULATORY_CARE_PROVIDER_SITE_OTHER): Payer: 59 | Admitting: Family Medicine

## 2016-12-17 VITALS — BP 125/81 | HR 65 | Temp 98.0°F | Ht 72.0 in | Wt 259.0 lb

## 2016-12-17 DIAGNOSIS — F3289 Other specified depressive episodes: Secondary | ICD-10-CM

## 2016-12-17 DIAGNOSIS — E559 Vitamin D deficiency, unspecified: Secondary | ICD-10-CM | POA: Diagnosis not present

## 2016-12-17 DIAGNOSIS — Z6835 Body mass index (BMI) 35.0-35.9, adult: Secondary | ICD-10-CM | POA: Diagnosis not present

## 2016-12-17 DIAGNOSIS — Z9189 Other specified personal risk factors, not elsewhere classified: Secondary | ICD-10-CM | POA: Diagnosis not present

## 2016-12-17 LAB — INSULIN, RANDOM: Insulin: 4.1 u[IU]/mL (ref 2.0–19.6)

## 2016-12-17 MED ORDER — BUPROPION HCL ER (SR) 200 MG PO TB12: 200.0000 mg | ORAL_TABLET | Freq: Every day | ORAL | 0 refills | Status: DC

## 2016-12-17 MED ORDER — VITAMIN D (ERGOCALCIFEROL) 1.25 MG (50000 UNIT) PO CAPS: 50000.0000 [IU] | ORAL_CAPSULE | ORAL | 0 refills | Status: DC

## 2016-12-17 MED FILL — BUPROPION HCL SR 200 MG TAB: 200 | 30 days supply | Qty: 30 | Fill #0

## 2016-12-17 MED FILL — VIT D2 1.25 MG (50,000 UNIT: 1.25 MG | 28 days supply | Qty: 4 | Fill #0

## 2016-12-18 NOTE — Progress Notes (Signed)
Office: [phone]  /  Fax: [phone]   HPI:   Chief Complaint: OBESITY Monique Gonzalez is here to discuss her progress with her obesity treatment plan. She is on the lower carbohydrate, vegetable and lean protein rich diet plan and is following her eating plan approximately 85 to 90 % of the time. She states she is exercising 0 minutes 0 times per week. Stamatia is retaining some fluid today. She struggled to follow the low carbohydrate plan due to constipation, so she deviated more by adding more snacks. Her weight is 259 lb (117.5 kg) today and has had a weight gain of 1 lb over a period of 2 weeks since her last visit. She has lost 18 lbs since starting treatment with [name].  Vitamin D deficiency Monique Gonzalez has a diagnosis of vitamin D deficiency. She is now at goal with vit D prescription and fatigue is improved. She denies denies nausea, vomiting or muscle weakness.  At risk for osteopenia and osteoporosis Monique Gonzalez is at higher risk of osteopenia and osteoporosis due to vitamin D deficiency.   Depression with emotional eating behaviors Monique Gonzalez mood improved on wellbutrin and she is doing better with emotional eating. Summar struggles with emotional eating and using food for comfort to the extent that it is negatively impacting her health. She often snacks when she is not hungry. Monique Gonzalez sometimes feels she is out of control and then feels guilty that she made poor food choices. She has been working on behavior modification techniques to help reduce her emotional eating and has been somewhat successful. She shows no sign of suicidal or homicidal ideations.  Depression screen Monique Gonzalez 2/9 03/25/2016 11/22/2012  Decreased Interest 3 0  Down, Depressed, Hopeless 3 0  PHQ - 2 Score 6 0  Altered sleeping 3 -  Tired, decreased energy 3 -  Change in appetite 3 -  Feeling bad or failure about yourself  3 -  Trouble concentrating 3 -  Moving slowly or fidgety/restless 3 -  Suicidal thoughts 0 -  PHQ-9 Score 24 -       ALLERGIES: Allergies  Allergen Reactions  . Penicillins Rash and Other (See Comments)    Has patient had a PCN reaction causing immediate rash, facial/tongue/throat swelling, SOB or lightheadedness with hypotension: Unknown Has patient had a PCN reaction causing severe rash involving mucus membranes or skin necrosis: Unknown Has patient had a PCN reaction that required hospitalization: No Has patient had a PCN reaction occurring within the last 10 years: No If all of the above answers are "NO", then may proceed with Cephalosporin use. Childhood reaction.  . Sulfa Antibiotics Rash    MEDICATIONS: Current Outpatient Prescriptions on File Prior to Visit  Medication Sig Dispense Refill  . acetaminophen (TYLENOL) 650 MG CR tablet Take 1,300 mg by mouth at bedtime.    Marland Kitchen acyclovir cream (ZOVIRAX) 5 % Apply 1 application topically every 3 (three) hours. (Patient taking differently: Apply 1 application topically every 3 (three) hours as needed (for cold sores). ) 15 g 3  . atorvastatin (LIPITOR) 40 MG tablet Take 1 tablet (40 mg total) by mouth daily. (Patient taking differently: Take 40 mg by mouth at bedtime. ) 90 tablet 3  . cetirizine (ZYRTEC) 10 MG tablet Take 10 mg by mouth at bedtime.    . citalopram (CELEXA) 20 MG tablet Take 1 tablet (20 mg total) by mouth daily. (Patient taking differently: Take 20 mg by mouth at bedtime. ) 90 tablet 3  . clindamycin (CLEOCIN) 150 MG  capsule Take 300 mg by mouth as needed (for dental procedures).    . cyclobenzaprine (FLEXERIL) 10 MG tablet Take 1 tablet (10 mg total) by mouth 3 (three) times daily as needed for muscle spasms. 60 tablet 0  . fluticasone (FLONASE) 50 MCG/ACT nasal spray Place 2 sprays into both nostrils daily. (Patient taking differently: Place 2 sprays into both nostrils daily as needed. ) 16 g 6  . loratadine (CLARITIN) 10 MG tablet Take 10 mg by mouth at bedtime.     . Multiple Vitamins-Minerals (MULTIVITAMIN,TX-MINERALS) tablet  Take 1 tablet by mouth daily. POWDER MIX    . valACYclovir (VALTREX) 1000 MG tablet TAKE 2 TABLETS BY MOUTH EVERY 12 HOURS FOR 2 DOSES AS NEEDED FOR FLARE (Patient taking differently: Take 1,000 mg by mouth every 12 (twelve) hours as needed (for cold sores.). ) 30 tablet 11   No current facility-administered medications on file prior to visit.     PAST MEDICAL HISTORY: Past Medical History:  Diagnosis Date  . Allergy   . Chronic pain    back and knee  . Colitis 09/2012   infectious vs inflammatory.   . Colon stricture ([place])   . Depression   . Hyperlipidemia   . Obesity   . Osteoarthritis   . Osteopenia   . Recurrent cold sores   . Seasonal allergies   . Syncope and collapse 11/30/2012   Normal EEG.  Vaso vagal syncope    PAST SURGICAL HISTORY: Past Surgical History:  Procedure Laterality Date  . COLON RESECTION N/A 10/14/2013   Procedure: LAPAROSCOPIC MOBILIZATION OF SPLENIC FLEXURE, LAPAROSCOPIC EXTENDED LEFT COLECTOMY;  Surgeon: [name], MD;  Location: [facility];  Service: General;  Laterality: N/A;  . COLONOSCOPY N/A 10/12/2013   Procedure: COLONOSCOPY;  Surgeon: [name], MD;  Location: [facility] ENDOSCOPY;  Service: Endoscopy;  Laterality: N/A;  . DILATION AND CURETTAGE, DIAGNOSTIC / THERAPEUTIC  1987  . OPEN REDUCTION INTERNAL FIXATION (ORIF) DISTAL RADIAL FRACTURE Right 08/08/2014   Procedure: OPEN REDUCTION INTERNAL FIXATION (ORIF) DISTAL RADIAL FRACTURE;  Surgeon: Roseanne Kaufman, MD;  Location: [facility];  Service: Orthopedics;  Laterality: Right;  DVR Crosslock, MD available sometime around 1430-1500  . TONSILLECTOMY    . TOTAL KNEE ARTHROPLASTY Right 08/05/2016  . TOTAL KNEE ARTHROPLASTY Right 08/05/2016   Procedure: RIGHT TOTAL KNEE ARTHROPLASTY;  Surgeon: Mcarthur Rossetti, MD;  Location: [facility];  Service: Orthopedics;  Laterality: Right;    SOCIAL HISTORY: Social History  Substance Use Topics  . Smoking status: Former Smoker    Quit date: 10/27/1993  . Smokeless  tobacco: Never Used  . Alcohol use Yes     Comment: one drink per week    FAMILY HISTORY: Family History  Problem Relation Age of Onset  . Stroke Mother   . Arthritis Mother   . Thyroid disease Mother   . Cancer Father   . Arthritis Sister   . Thyroid disease Sister   . Cancer Maternal Grandmother   . Stroke Maternal Grandfather   . Cancer Maternal Grandfather   . Colon cancer Maternal Grandfather   . Cancer Paternal Grandmother   . Heart disease Paternal Grandmother   . Colon cancer Paternal Grandmother   . Stroke Paternal Grandfather   . Arthritis Sister   . Obesity Sister   . Sudden death Neg Hx   . Hypertension Neg Hx   . Hyperlipidemia Neg Hx   . Heart attack Neg Hx   . Diabetes Neg Hx   .  Rectal cancer Neg Hx   . Stomach cancer Neg Hx   . Esophageal cancer Neg Hx     ROS: Review of Systems  Constitutional: Negative for weight loss.  Gastrointestinal: Negative for nausea and vomiting.  Musculoskeletal:       Negative muscle weakness  Psychiatric/Behavioral: Positive for depression. Negative for suicidal ideas.    PHYSICAL EXAM: Blood pressure 125/81, pulse 65, temperature 98 F (36.7 C), temperature source Oral, height 6' (1.829 m), weight 259 lb (117.5 kg), SpO2 99 %. Body mass index is 35.13 kg/m. Physical Exam  Constitutional: She is oriented to person, place, and time. She appears well-developed and well-nourished.  Cardiovascular: Normal rate.   Pulmonary/Chest: Effort normal.  Musculoskeletal: Normal range of motion.  Neurological: She is oriented to person, place, and time.  Skin: Skin is warm and dry.  Psychiatric: She has a normal mood and affect. Her behavior is normal.  Vitals reviewed.   RECENT LABS AND TESTS: BMET    Component Value Date/Time   NA 139 12/11/2016 0858   NA 142 03/25/2016 1341   K 5.1 12/11/2016 0858   CL 104 12/11/2016 0858   CO2 26 12/11/2016 0858   GLUCOSE 92 12/11/2016 0858   BUN 26 (H) 12/11/2016 0858   BUN 16  03/25/2016 1341   CREATININE 0.87 12/11/2016 0858   CALCIUM 9.4 12/11/2016 0858   GFRNONAA >60 08/06/2016 0537   GFRNONAA 77 03/20/2015 1420   GFRAA >60 08/06/2016 0537   GFRAA 89 03/20/2015 1420   Lab Results  Component Value Date   HGBA1C 5.2 12/11/2016   HGBA1C 5.3 03/25/2016   HGBA1C (H) 09/11/2009    5.7 (NOTE)                                                                       According to the ADA Clinical Practice Recommendations for 2011, when HbA1c is used as a screening test:   >=6.5%   Diagnostic of Diabetes Mellitus           (if abnormal result  is confirmed)  5.7-6.4%   Increased risk of developing Diabetes Mellitus  References:Diagnosis and Classification of Diabetes Mellitus,Diabetes VCBS,4967,59(FMBWG 1):S62-S69 and Standards of Medical Care in         Diabetes - 2011,Diabetes Care,2011,34  (Suppl 1):S11-S61.   Lab Results  Component Value Date   INSULIN 5.5 03/25/2016   CBC    Component Value Date/Time   WBC 5.0 12/11/2016 0858   RBC 4.26 12/11/2016 0858   HGB 12.0 12/11/2016 0858   HGB 12.5 03/25/2016 1341   HCT 36.2 12/11/2016 0858   HCT 37.2 03/25/2016 1341   PLT 218 12/11/2016 0858   MCV 85.0 12/11/2016 0858   MCV 88 03/25/2016 1341   MCH 28.2 12/11/2016 0858   MCHC 33.1 12/11/2016 0858   RDW 14.5 12/11/2016 0858   RDW 14.0 03/25/2016 1341   LYMPHSABS 1,215 12/11/2016 0858   LYMPHSABS 1.2 03/25/2016 1341   MONOABS 0.5 03/20/2015 1420   EOSABS 30 12/11/2016 0858   EOSABS 0.0 03/25/2016 1341   BASOSABS 30 12/11/2016 0858   BASOSABS 0.0 03/25/2016 1341   Iron/TIBC/Ferritin/ %Sat No results found for: IRON, TIBC, FERRITIN, IRONPCTSAT Lipid Panel     Component  Value Date/Time   CHOL 189 12/11/2016 0858   CHOL 172 03/25/2016 1341   TRIG 120 12/11/2016 0858   HDL 57 12/11/2016 0858   HDL 59 03/25/2016 1341   CHOLHDL 3.3 12/11/2016 0858   VLDL 35 (H) 03/20/2015 1420   LDLCALC 84 03/25/2016 1341   Hepatic Function Panel     Component Value  Date/Time   PROT 6.2 12/11/2016 0858   PROT 6.5 03/25/2016 1341   ALBUMIN 4.2 03/25/2016 1341   AST 15 12/11/2016 0858   ALT 14 12/11/2016 0858   ALKPHOS 72 03/25/2016 1341   BILITOT 0.5 12/11/2016 0858   BILITOT 0.4 03/25/2016 1341      Component Value Date/Time   TSH 1.420 03/25/2016 1341   TSH 1.530 03/20/2015 1420   TSH 2.427 12/04/2014 0908    ASSESSMENT AND PLAN: Vitamin D deficiency - Plan: Vitamin D, Ergocalciferol, (DRISDOL) 50000 units CAPS capsule  Other depression - with emotional eating  - Plan: buPROPion (WELLBUTRIN SR) 200 MG 12 hr tablet  At risk for osteoporosis  Class 2 severe obesity with serious comorbidity and body mass index (BMI) of 35.0 to 35.9 in adult, unspecified obesity type (Monique Gonzalez)  PLAN:  Vitamin D Deficiency Monique Gonzalez was informed that low vitamin D levels contributes to fatigue and are associated with obesity, breast, and colon cancer. She agrees to continue to take prescription Vit D @50 ,000 IU every week #4 with no refills. We will recheck labs in 3 months and will follow up for routine testing of vitamin D, at least 2-3 times per year. She was informed of the risk of over-replacement of vitamin D and agrees to not increase her dose unless he discusses this with [name] first.  At risk for osteopenia and osteoporosis Monique Gonzalez is at risk for osteopenia and osteoporosis due to her vitamin D deficiency. She was encouraged to take her vitamin D and follow her higher calcium diet and increase strengthening exercise to help strengthen her bones and decrease her risk of osteopenia and osteoporosis.  Depression with Emotional Eating Behaviors We discussed behavior modification techniques today to help Monique Gonzalez deal with her emotional eating and depression. She has agreed to take Wellbutrin SR 200 mg qd #30 with no refills and will follow up as directed.  Obesity Monique Gonzalez is currently in the action stage of change. As such, her goal is to continue with weight loss efforts She has  agreed to change to follow the Monique Gonzalez eating plan Monique Gonzalez has been instructed to work up to a goal of 150 minutes of combined cardio and strengthening exercise per week for weight loss and overall health benefits. We discussed the following Behavioral Modification Strategies today: increasing lean protein intake, decreasing simple carbohydrates  and work on meal planning and easy cooking plans  Monique Gonzalez has agreed to follow up with our clinic in 3 weeks. She was informed of the importance of frequent follow up visits to maximize her success with intensive lifestyle modifications for her multiple health conditions.  I, Doreene Nest, am acting as transcriptionist for Dennard Nip, MD  I have reviewed the above documentation for accuracy and completeness, and I agree with the above. -Dennard Nip, MD    OBESITY BEHAVIORAL INTERVENTION VISIT  Today's visit was # 15 out of 41.  Starting weight: 277 lbs Starting date: 03/25/16 Today's weight : 259 lbs  Today's date: 12/17/2016 Total lbs lost to date: 18 (Patients must lose 7 lbs in the first 6 months to continue with counseling)   ASK:  We discussed the diagnosis of obesity with Monique Gonzalez today and Monique Gonzalez agreed to give [name] permission to discuss obesity behavioral modification therapy today.  ASSESS: Monique Gonzalez has the diagnosis of obesity and her BMI today is 35.12 Monique Gonzalez is in the action stage of change   ADVISE: Monique Gonzalez was educated on the multiple health risks of obesity as well as the benefit of weight loss to improve her health. She was advised of the need for long term treatment and the importance of lifestyle modifications.  AGREE: Multiple dietary modification options and treatment options were discussed and  Monique Gonzalez agreed to change to follow the Monique Gonzalez eating plan We discussed the following Behavioral Modification Strategies today: increasing lean protein intake, decreasing simple carbohydrates  and work on meal planning and easy cooking  plans

## 2016-12-22 ENCOUNTER — Encounter: Payer: Self-pay | Admitting: Physician Assistant

## 2016-12-22 ENCOUNTER — Ambulatory Visit (INDEPENDENT_AMBULATORY_CARE_PROVIDER_SITE_OTHER): Payer: 59 | Admitting: Physician Assistant

## 2016-12-22 VITALS — BP 124/74 | HR 70 | Temp 98.2°F | Resp 14 | Ht 72.0 in | Wt 262.0 lb

## 2016-12-22 DIAGNOSIS — F32A Depression, unspecified: Secondary | ICD-10-CM

## 2016-12-22 DIAGNOSIS — E785 Hyperlipidemia, unspecified: Secondary | ICD-10-CM

## 2016-12-22 DIAGNOSIS — Z96651 Presence of right artificial knee joint: Secondary | ICD-10-CM

## 2016-12-22 DIAGNOSIS — Z6835 Body mass index (BMI) 35.0-35.9, adult: Secondary | ICD-10-CM | POA: Diagnosis not present

## 2016-12-22 DIAGNOSIS — E6609 Other obesity due to excess calories: Secondary | ICD-10-CM

## 2016-12-22 DIAGNOSIS — F419 Anxiety disorder, unspecified: Secondary | ICD-10-CM

## 2016-12-22 DIAGNOSIS — Z6837 Body mass index (BMI) 37.0-37.9, adult: Secondary | ICD-10-CM | POA: Diagnosis not present

## 2016-12-22 DIAGNOSIS — E66812 Obesity, class 2: Secondary | ICD-10-CM

## 2016-12-22 DIAGNOSIS — F329 Major depressive disorder, single episode, unspecified: Secondary | ICD-10-CM | POA: Diagnosis not present

## 2016-12-22 DIAGNOSIS — E559 Vitamin D deficiency, unspecified: Secondary | ICD-10-CM | POA: Diagnosis not present

## 2016-12-22 DIAGNOSIS — E669 Obesity, unspecified: Secondary | ICD-10-CM | POA: Diagnosis not present

## 2016-12-22 HISTORY — DX: Anxiety disorder, unspecified: F41.9

## 2016-12-22 NOTE — Progress Notes (Signed)
[name] MRN: [id number], DOB: 1955-05-27, 61 y.o. Date of Encounter: @DATE @  Chief Complaint:  Chief Complaint  Patient presents with  . Follow-up    has had labs    HPI: 61 y.o. year old female  presents for routine f/u OV to f/u her chronic medical problems.   Recently she has also been going to a wellness clinic to help with weight management. She reports that they prescribed the Wellbutrin to help curb her appetite. She reports that she can tell that the Wellbutrin does help but says that when she has a lot of stress then "nothing helps " and she eats a lot at those times.  She reports that the Celexa continues to do well at controlling her anxiety and depression symptoms. It is causing no adverse effects.  She continues to take Lipitor 40 mg daily. This is causing no myalgias, right upper quadrant pain or other adverse effects.  In the past year she did have knee replacement surgery. She is now able to walk more and be more active than prior to the knee surgery.  She recently had fasting labs performed to review at today's visit.  She has no other specific concerns to address today.   Past Medical History:  Diagnosis Date  . Allergy   . Chronic pain    back and knee  . Colitis 09/2012   infectious vs inflammatory.   . Colon stricture ([place])   . Depression   . Hyperlipidemia   . Obesity   . Osteoarthritis   . Osteopenia   . Recurrent cold sores   . Seasonal allergies   . Syncope and collapse 11/30/2012   Normal EEG.  Vaso vagal syncope     Home Meds: Outpatient Medications Prior to Visit  Medication Sig Dispense Refill  . acetaminophen (TYLENOL) 650 MG CR tablet Take 1,300 mg by mouth at bedtime.    Marland Kitchen acyclovir cream (ZOVIRAX) 5 % Apply 1 application topically every 3 (three) hours. (Patient taking differently: Apply 1 application topically every 3 (three) hours as needed (for cold sores). ) 15 g 3  . atorvastatin (LIPITOR) 40 MG tablet Take  1 tablet (40 mg total) by mouth daily. (Patient taking differently: Take 40 mg by mouth at bedtime. ) 90 tablet 3  . buPROPion (WELLBUTRIN SR) 200 MG 12 hr tablet Take 1 tablet (200 mg total) by mouth daily. 30 tablet 0  . cetirizine (ZYRTEC) 10 MG tablet Take 10 mg by mouth at bedtime.    . citalopram (CELEXA) 20 MG tablet Take 1 tablet (20 mg total) by mouth daily. (Patient taking differently: Take 20 mg by mouth at bedtime. ) 90 tablet 3  . cyclobenzaprine (FLEXERIL) 10 MG tablet Take 1 tablet (10 mg total) by mouth 3 (three) times daily as needed for muscle spasms. 60 tablet 0  . fluticasone (FLONASE) 50 MCG/ACT nasal spray Place 2 sprays into both nostrils daily. (Patient taking differently: Place 2 sprays into both nostrils daily as needed. ) 16 g 6  . loratadine (CLARITIN) 10 MG tablet Take 10 mg by mouth at bedtime.     . meloxicam (MOBIC) 15 MG tablet Take 15 mg by mouth daily.    . Multiple Vitamins-Minerals (MULTIVITAMIN,TX-MINERALS) tablet Take 1 tablet by mouth daily. POWDER MIX    . valACYclovir (VALTREX) 1000 MG tablet TAKE 2 TABLETS BY MOUTH EVERY 12 HOURS FOR 2 DOSES AS NEEDED FOR FLARE (Patient taking differently: Take 1,000 mg by mouth  every 12 (twelve) hours as needed (for cold sores.). ) 30 tablet 11  . Vitamin D, Ergocalciferol, (DRISDOL) 50000 units CAPS capsule Take 1 capsule (50,000 Units total) by mouth every 7 (seven) days. 4 capsule 0  . clindamycin (CLEOCIN) 150 MG capsule Take 300 mg by mouth as needed (for dental procedures).     No facility-administered medications prior to visit.     Allergies:  Allergies  Allergen Reactions  . Penicillins Rash and Other (See Comments)    Has patient had a PCN reaction causing immediate rash, facial/tongue/throat swelling, SOB or lightheadedness with hypotension: Unknown Has patient had a PCN reaction causing severe rash involving mucus membranes or skin necrosis: Unknown Has patient had a PCN reaction that required  hospitalization: No Has patient had a PCN reaction occurring within the last 10 years: No If all of the above answers are "NO", then may proceed with Cephalosporin use. Childhood reaction.  . Sulfa Antibiotics Rash    Social History   Social History  . Marital status: Divorced    Spouse name: N/A  . Number of children: 2  . Years of education: college   Occupational History  . 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 Smoking status: Former Smoker    Quit date: 10/27/1993  . Smokeless tobacco: Never Used  . Alcohol use Yes     Comment: one drink per week  . Drug use: No  . Sexual activity: Not Currently   Other Topics Concern  . Not on file   Social History Narrative  . No narrative on file    Family History  Problem Relation Age of Onset  . Stroke Mother   . Arthritis Mother   . Thyroid disease Mother   . Cancer Father   . Arthritis Sister   . Thyroid disease Sister   . Cancer Maternal Grandmother   . Stroke Maternal Grandfather   . Cancer Maternal Grandfather   . Colon cancer Maternal Grandfather   . Cancer Paternal Grandmother   . Heart disease Paternal Grandmother   . Colon cancer Paternal Grandmother   . Stroke Paternal Grandfather   . Arthritis Sister   . Obesity Sister   . Sudden death Neg Hx   . Hypertension Neg Hx   . Hyperlipidemia Neg Hx   . Heart attack Neg Hx   . Diabetes Neg Hx   . Rectal cancer Neg Hx   . Stomach cancer Neg Hx   . Esophageal cancer Neg Hx      Review of Systems:  See HPI for pertinent ROS. All other ROS negative.    Physical Exam: Blood pressure 124/74, pulse 70, temperature 98.2 F (36.8 C), temperature source Oral, resp. rate 14, height 6' (1.829 m), weight 118.8 kg (262 lb), SpO2 99 %., Body mass index is 35.53 kg/m. General: WF.  Appears in no acute distress. Neck: Supple. No thyromegaly. No lymphadenopathy. No carotid bruits. Lungs: Clear bilaterally to auscultation  without wheezes, rales, or rhonchi. Breathing is unlabored. Heart: RRR with S1 S2. No murmurs, rubs, or gallops. Abdomen: Soft, non-tender, non-distended with normoactive bowel sounds. No hepatomegaly. No rebound/guarding. No obvious abdominal masses. Musculoskeletal:  Strength and tone normal for age. Extremities/Skin: Warm and dry.No LE edema.  Neuro: Alert and oriented X 3. Moves all extremities spontaneously. Gait is normal. CNII-XII grossly in tact. Psych:  Responds to questions appropriately with a normal affect.   She had  labs on 12/11/2016. CBC was normal. A1c 5.2. CME T normal. Vitamin D 60. Insulin normal. LDL 109.  ASSESSMENT AND PLAN:  61 y.o. year old female with  1. Hyperlipidemia, unspecified hyperlipidemia type Lipids are currently controlled on current treatment. Continue diet and weight management. Continue Lipitor at 40mg .  2. Class 2 obesity without serious comorbidity with body mass index (BMI) of 37.0 to 37.9 in adult, unspecified obesity type In the past when she was started the wellness clinic BMI was in this 37 range.Continue management at wellness clinic and continue Wellbutrin to help curb appetite.  3. Vitamin D deficiency Vitamin D level is at 60 on current dose of vitamin D. Continue current thyroid dose vitamin D.  4. Class 2 obesity due to excess calories without serious comorbidity with body mass index (BMI) of 35.0 to 35.9 in adult Current BMI is 35.Continue management at wellness clinic and continue Wellbutrin to help curb appetite.   5. Status post total right knee replacement Activity level is improved since knee replacement surgery.  6. Anxiety and depression She reports that this is stable/controlled on current dose of Celexa 20 mg.This medicine is causing no adverse effects. Continue current treatment with no change.   Signed, [address] [place], [place], [facility] 12/22/2016 8:18 AM

## 2016-12-31 ENCOUNTER — Encounter (INDEPENDENT_AMBULATORY_CARE_PROVIDER_SITE_OTHER): Payer: Self-pay | Admitting: Family Medicine

## 2017-01-04 ENCOUNTER — Encounter: Payer: Self-pay | Admitting: Physician Assistant

## 2017-01-05 ENCOUNTER — Other Ambulatory Visit: Payer: Self-pay

## 2017-01-05 DIAGNOSIS — G8929 Other chronic pain: Secondary | ICD-10-CM

## 2017-01-05 DIAGNOSIS — M545 Low back pain, unspecified: Secondary | ICD-10-CM

## 2017-01-05 MED ORDER — CYCLOBENZAPRINE HCL 10 MG PO TABS: 10.0000 mg | ORAL_TABLET | Freq: Three times a day (TID) | ORAL | 0 refills | Status: DC | PRN

## 2017-01-05 MED ORDER — CITALOPRAM HYDROBROMIDE 20 MG PO TABS: 20.0000 mg | ORAL_TABLET | Freq: Every day | ORAL | 3 refills | Status: DC

## 2017-01-05 MED FILL — CITALOPRAM HBR 20 MG TABLET: 20 | 90 days supply | Qty: 90 | Fill #0

## 2017-01-05 MED FILL — CYCLOBENZAPRINE 10 MG TAB: 10 | 20 days supply | Qty: 60 | Fill #0

## 2017-01-05 NOTE — Telephone Encounter (Signed)
Refill appropriate 

## 2017-01-06 ENCOUNTER — Ambulatory Visit (INDEPENDENT_AMBULATORY_CARE_PROVIDER_SITE_OTHER): Payer: 59 | Admitting: Family Medicine

## 2017-01-06 VITALS — BP 124/78 | HR 76 | Temp 97.6°F | Ht 72.0 in | Wt 256.0 lb

## 2017-01-06 DIAGNOSIS — F3289 Other specified depressive episodes: Secondary | ICD-10-CM

## 2017-01-06 DIAGNOSIS — E669 Obesity, unspecified: Secondary | ICD-10-CM | POA: Diagnosis not present

## 2017-01-06 DIAGNOSIS — Z9189 Other specified personal risk factors, not elsewhere classified: Secondary | ICD-10-CM | POA: Diagnosis not present

## 2017-01-06 DIAGNOSIS — E559 Vitamin D deficiency, unspecified: Secondary | ICD-10-CM

## 2017-01-06 DIAGNOSIS — Z6834 Body mass index (BMI) 34.0-34.9, adult: Secondary | ICD-10-CM | POA: Diagnosis not present

## 2017-01-06 MED ORDER — BUPROPION HCL ER (SR) 200 MG PO TB12: 200.0000 mg | ORAL_TABLET | Freq: Every day | ORAL | 0 refills | Status: DC

## 2017-01-06 NOTE — Progress Notes (Signed)
Office: [phone]  /  Fax: [phone]   HPI:   Chief Complaint: OBESITY Monique Gonzalez is here to discuss her progress with her obesity treatment plan. She is on the Pescatarian eating plan and is following her eating plan approximately 50 % of the time. She states she is exercising 0 minutes 0 times per week. Monique Gonzalez continues to do well with weight loss. She is trying to portion control and make smarter food choices when she deviates from her plan. She is working on increasing vegetables and lean protein. Her weight is 256 lb (116.1 kg) today and has had a weight loss of 3 pounds over a period of 2 to 3 weeks since her last visit. She has lost 21 lbs since starting treatment with [name].  Vitamin D deficiency Monique Gonzalez has a diagnosis of vitamin D deficiency. She is stable on prescription Vit D, and decreased dose to QOW since she is at goal. She denies nausea, vomiting or muscle weakness.  At risk for cardiovascular disease Monique Gonzalez is at a higher than average risk for cardiovascular disease due to obesity. She currently denies any chest pain.  Depression with emotional eating behaviors Monique Gonzalez is struggling with emotional eating and using food for comfort to the extent that it is negatively impacting her health. She often snacks when she is not hungry. Monique Gonzalez sometimes feels she is out of control and then feels guilty that she made poor food choices. She has been working on behavior modification techniques to help reduce her emotional eating and has been somewhat successful. Her mood is stable on Wellbutrin and she shows no sign of suicidal or homicidal ideations.  Depression screen Monique Gonzalez 2/9 12/22/2016 03/25/2016 11/22/2012  Decreased Interest 3 3 0  Down, Depressed, Hopeless 3 3 0  PHQ - 2 Score 6 6 0  Altered sleeping 1 3 -  Tired, decreased energy 1 3 -  Change in appetite 3 3 -  Feeling bad or failure about yourself  1 3 -  Trouble concentrating 1 3 -  Moving slowly or fidgety/restless 0 3 -  Suicidal thoughts  0 0 -  PHQ-9 Score 13 24 -  Difficult doing work/chores Somewhat difficult - -   ALLERGIES: Allergies  Allergen Reactions  . Penicillins Rash and Other (See Comments)    Has patient had a PCN reaction causing immediate rash, facial/tongue/throat swelling, SOB or lightheadedness with hypotension: Unknown Has patient had a PCN reaction causing severe rash involving mucus membranes or skin necrosis: Unknown Has patient had a PCN reaction that required hospitalization: No Has patient had a PCN reaction occurring within the last 10 years: No If all of the above answers are "NO", then may proceed with Cephalosporin use. Childhood reaction.  . Sulfa Antibiotics Rash    MEDICATIONS: Current Outpatient Prescriptions on File Prior to Visit  Medication Sig Dispense Refill  . acetaminophen (TYLENOL) 650 MG CR tablet Take 1,300 mg by mouth at bedtime.    Marland Kitchen acyclovir cream (ZOVIRAX) 5 % Apply 1 application topically every 3 (three) hours. (Patient taking differently: Apply 1 application topically every 3 (three) hours as needed (for cold sores). ) 15 g 3  . atorvastatin (LIPITOR) 40 MG tablet Take 1 tablet (40 mg total) by mouth daily. (Patient taking differently: Take 40 mg by mouth at bedtime. ) 90 tablet 3  . buPROPion (WELLBUTRIN SR) 200 MG 12 hr tablet Take 1 tablet (200 mg total) by mouth daily. 30 tablet 0  . cetirizine (ZYRTEC) 10 MG tablet Take  10 mg by mouth at bedtime.    . citalopram (CELEXA) 20 MG tablet Take 1 tablet (20 mg total) by mouth at bedtime. 90 tablet 3  . clindamycin (CLEOCIN) 150 MG capsule Take 300 mg by mouth as needed (for dental procedures).    . cyclobenzaprine (FLEXERIL) 10 MG tablet Take 1 tablet (10 mg total) by mouth 3 (three) times daily as needed for muscle spasms. 60 tablet 0  . fluticasone (FLONASE) 50 MCG/ACT nasal spray Place 2 sprays into both nostrils daily. (Patient taking differently: Place 2 sprays into both nostrils daily as needed. ) 16 g 6  . loratadine  (CLARITIN) 10 MG tablet Take 10 mg by mouth at bedtime.     . Multiple Vitamins-Minerals (MULTIVITAMIN,TX-MINERALS) tablet Take 1 tablet by mouth daily. POWDER MIX    . valACYclovir (VALTREX) 1000 MG tablet TAKE 2 TABLETS BY MOUTH EVERY 12 HOURS FOR 2 DOSES AS NEEDED FOR FLARE (Patient taking differently: Take 1,000 mg by mouth every 12 (twelve) hours as needed (for cold sores.). ) 30 tablet 11  . Vitamin D, Ergocalciferol, (DRISDOL) 50000 units CAPS capsule Take 1 capsule (50,000 Units total) by mouth every 7 (seven) days. 4 capsule 0   No current facility-administered medications on file prior to visit.     PAST MEDICAL HISTORY: Past Medical History:  Diagnosis Date  . Allergy   . Chronic pain    back and knee  . Colitis 09/2012   infectious vs inflammatory.   . Colon stricture ([place])   . Depression   . Hyperlipidemia   . Obesity   . Osteoarthritis   . Osteopenia   . Recurrent cold sores   . Seasonal allergies   . Syncope and collapse 11/30/2012   Normal EEG.  Vaso vagal syncope    PAST SURGICAL HISTORY: Past Surgical History:  Procedure Laterality Date  . COLON RESECTION N/A 10/14/2013   Procedure: LAPAROSCOPIC MOBILIZATION OF SPLENIC FLEXURE, LAPAROSCOPIC EXTENDED LEFT COLECTOMY;  Surgeon: [name], MD;  Location: [place];  Service: General;  Laterality: N/A;  . COLONOSCOPY N/A 10/12/2013   Procedure: COLONOSCOPY;  Surgeon: [name], MD;  Location: [facility];  Service: Endoscopy;  Laterality: N/A;  . DILATION AND CURETTAGE, DIAGNOSTIC / THERAPEUTIC  1987  . OPEN REDUCTION INTERNAL FIXATION (ORIF) DISTAL RADIAL FRACTURE Right 08/08/2014   Procedure: OPEN REDUCTION INTERNAL FIXATION (ORIF) DISTAL RADIAL FRACTURE;  Surgeon: Roseanne Kaufman, MD;  Location: [place];  Service: Orthopedics;  Laterality: Right;  DVR Crosslock, MD available sometime around 1430-1500  . TONSILLECTOMY    . TOTAL KNEE ARTHROPLASTY Right 08/05/2016  . TOTAL KNEE ARTHROPLASTY Right 08/05/2016    Procedure: RIGHT TOTAL KNEE ARTHROPLASTY;  Surgeon: Mcarthur Rossetti, MD;  Location: [place];  Service: Orthopedics;  Laterality: Right;    SOCIAL HISTORY: Social History  Substance Use Topics  . Smoking status: Former Smoker    Quit date: 10/27/1993  . Smokeless tobacco: Never Used  . Alcohol use Yes     Comment: one drink per week    FAMILY HISTORY: Family History  Problem Relation Age of Onset  . Stroke Mother   . Arthritis Mother   . Thyroid disease Mother   . Cancer Father   . Arthritis Sister   . Thyroid disease Sister   . Cancer Maternal Grandmother   . Stroke Maternal Grandfather   . Cancer Maternal Grandfather   . Colon cancer Maternal Grandfather   . Cancer Paternal Grandmother   . Heart disease  Paternal Grandmother   . Colon cancer Paternal Grandmother   . Stroke Paternal Grandfather   . Arthritis Sister   . Obesity Sister   . Sudden death Neg Hx   . Hypertension Neg Hx   . Hyperlipidemia Neg Hx   . Heart attack Neg Hx   . Diabetes Neg Hx   . Rectal cancer Neg Hx   . Stomach cancer Neg Hx   . Esophageal cancer Neg Hx     ROS: Review of Systems  Constitutional: Positive for weight loss.  Cardiovascular: Negative for chest pain.  Gastrointestinal: Negative for nausea and vomiting.  Musculoskeletal:       Negative muscle weakness  Psychiatric/Behavioral: Positive for depression. Negative for suicidal ideas.    PHYSICAL EXAM: Blood pressure 124/78, pulse 76, temperature 97.6 F (36.4 C), temperature source Oral, height 6' (1.829 m), weight 256 lb (116.1 kg), SpO2 98 %. Body mass index is 34.72 kg/m. Physical Exam  Constitutional: She is oriented to person, place, and time. She appears well-developed and well-nourished.  Cardiovascular: Normal rate.   Pulmonary/Chest: Effort normal.  Musculoskeletal: Normal range of motion.  Neurological: She is oriented to person, place, and time.  Skin: Skin is warm and dry.  Psychiatric: She has a normal  mood and affect.  Vitals reviewed.   RECENT LABS AND TESTS: BMET    Component Value Date/Time   NA 139 12/11/2016 0858   NA 142 03/25/2016 1341   K 5.1 12/11/2016 0858   CL 104 12/11/2016 0858   CO2 26 12/11/2016 0858   GLUCOSE 92 12/11/2016 0858   BUN 26 (H) 12/11/2016 0858   BUN 16 03/25/2016 1341   CREATININE 0.87 12/11/2016 0858   CALCIUM 9.4 12/11/2016 0858   GFRNONAA >60 08/06/2016 0537   GFRNONAA 77 03/20/2015 1420   GFRAA >60 08/06/2016 0537   GFRAA 89 03/20/2015 1420   Lab Results  Component Value Date   HGBA1C 5.2 12/11/2016   HGBA1C 5.3 03/25/2016   HGBA1C (H) 09/11/2009    5.7 (NOTE)                                                                       According to the ADA Clinical Practice Recommendations for 2011, when HbA1c is used as a screening test:   >=6.5%   Diagnostic of Diabetes Mellitus           (if abnormal result  is confirmed)  5.7-6.4%   Increased risk of developing Diabetes Mellitus  References:Diagnosis and Classification of Diabetes Mellitus,Diabetes IEPP,2951,88(CZYSA 1):S62-S69 and Standards of Medical Care in         Diabetes - 2011,Diabetes Care,2011,34  (Suppl 1):S11-S61.   Lab Results  Component Value Date   INSULIN 5.5 03/25/2016   CBC    Component Value Date/Time   WBC 5.0 12/11/2016 0858   RBC 4.26 12/11/2016 0858   HGB 12.0 12/11/2016 0858   HGB 12.5 03/25/2016 1341   HCT 36.2 12/11/2016 0858   HCT 37.2 03/25/2016 1341   PLT 218 12/11/2016 0858   MCV 85.0 12/11/2016 0858   MCV 88 03/25/2016 1341   MCH 28.2 12/11/2016 0858   MCHC 33.1 12/11/2016 0858   RDW 14.5 12/11/2016 0858   RDW 14.0 03/25/2016  [place] 12/11/2016 0858   LYMPHSABS 1.2 03/25/2016 1341   MONOABS 0.5 03/20/2015 1420   EOSABS 30 12/11/2016 0858   EOSABS 0.0 03/25/2016 1341   BASOSABS 30 12/11/2016 0858   BASOSABS 0.0 03/25/2016 1341   Iron/TIBC/Ferritin/ %Sat No results found for: IRON, TIBC, FERRITIN, IRONPCTSAT Lipid Panel       Component Value Date/Time   CHOL 189 12/11/2016 0858   CHOL 172 03/25/2016 1341   TRIG 120 12/11/2016 0858   HDL 57 12/11/2016 0858   HDL 59 03/25/2016 1341   CHOLHDL 3.3 12/11/2016 0858   VLDL 35 (H) 03/20/2015 1420   LDLCALC 84 03/25/2016 1341   Hepatic Function Panel     Component Value Date/Time   PROT 6.2 12/11/2016 0858   PROT 6.5 03/25/2016 1341   ALBUMIN 4.2 03/25/2016 1341   AST 15 12/11/2016 0858   ALT 14 12/11/2016 0858   ALKPHOS 72 03/25/2016 1341   BILITOT 0.5 12/11/2016 0858   BILITOT 0.4 03/25/2016 1341      Component Value Date/Time   TSH 1.420 03/25/2016 1341   TSH 1.530 03/20/2015 1420   TSH 2.427 12/04/2014 0908    ASSESSMENT AND PLAN: Vitamin D deficiency  Other depression - with emotional eating - Plan: buPROPion (WELLBUTRIN SR) 200 MG 12 hr tablet  At risk for heart disease  Class 1 obesity with serious comorbidity and body mass index (BMI) of 34.0 to 34.9 in adult, unspecified obesity type  PLAN:  Vitamin D Deficiency Baelyn was informed that low vitamin D levels contributes to fatigue and are associated with obesity, breast, and colon cancer. Khrystyna agrees to continue taking prescription Vit D @50 ,000 IU every week #4 and will follow up for routine testing of vitamin D, at least 2-3 times per year. She was informed of the risk of over-replacement of vitamin D and agrees to not increase her dose unless he discusses this with [name] first. We will recheck labs in 1 month and Cherae agrees to follow up with our clinic in 3 weeks.  Cardiovascular risk counselling Kiyara was given extended (15 minutes) coronary artery disease prevention counseling today. She is 61 y.o. female and has risk factors for heart disease including obesity. We discussed intensive lifestyle modifications today with an emphasis on specific weight loss instructions and strategies. Pt was also informed of the importance of increasing exercise and decreasing saturated fats to help prevent heart  disease.  Depression with Emotional Eating Behaviors We discussed behavior modification techniques today to help Shontavia deal with her emotional eating and depression. Jermany agrees to continue taking Wellbutrin SR 200 mg qd and we will refill for 1 month. Izora agrees to follow up with our clinic in 3 weeks.   Obesity Katerina is currently in the action stage of change. As such, her goal is to continue with weight loss efforts She has agreed to follow the Category 3 plan Shayden has been instructed to work up to a goal of 150 minutes of combined cardio and strengthening exercise per week for weight loss and overall health benefits. We discussed the following Behavioral Modification Strategies today: increasing lean protein intake, decreasing simple carbohydrates, work on meal planning and easy cooking plans, dealing with family or coworker sabotage, and holiday eating strategies    Kearstyn has agreed to follow up with our clinic in 3 weeks. She was informed of the importance of frequent follow up visits to maximize her success with intensive lifestyle modifications for her multiple health  conditions.  I, Trixie Dredge, am acting as transcriptionist for Dennard Nip, MD  I have reviewed the above documentation for accuracy and completeness, and I agree with the above. -Dennard Nip, MD      Today's visit was # 16 out of 22.  Starting weight: 277 lbs Starting date: 03/25/16  Today's weight : 256 lbs  Today's date: 01/06/2017 Total lbs lost to date: 21 (Patients must lose 7 lbs in the first 6 months to continue with counseling)   ASK: We discussed the diagnosis of obesity with [name] today and Maudie Mercury agreed to give [name] permission to discuss obesity behavioral modification therapy today.  ASSESS: Lizeth has the diagnosis of obesity and her BMI today is 34.71 Areonna is in the action stage of change   ADVISE: Nyeemah was educated on the multiple health risks of obesity as well as the benefit of weight loss  to improve her health. She was advised of the need for long term treatment and the importance of lifestyle modifications.  AGREE: Multiple dietary modification options and treatment options were discussed and  Zailah agreed to follow the Category 3 plan We discussed the following Behavioral Modification Strategies today: increasing lean protein intake, decreasing simple carbohydrates, work on meal planning and easy cooking plans, dealing with family or coworker sabotage, and holiday eating strategies

## 2017-01-12 MED FILL — BUPROPION HCL SR 200 MG TAB: 200 | 30 days supply | Qty: 30 | Fill #0

## 2017-01-14 ENCOUNTER — Emergency Department (HOSPITAL_COMMUNITY)
Admission: EM | Admit: 2017-01-14 | Discharge: 2017-01-14 | Disposition: A | Discharge status: Home / Self Care | Payer: 59 | Attending: Emergency Medicine | Admitting: Emergency Medicine

## 2017-01-14 ENCOUNTER — Other Ambulatory Visit: Payer: Self-pay

## 2017-01-14 ENCOUNTER — Encounter (HOSPITAL_COMMUNITY): Payer: Self-pay

## 2017-01-14 ENCOUNTER — Emergency Department (HOSPITAL_COMMUNITY): Payer: 59

## 2017-01-14 DIAGNOSIS — R110 Nausea: Secondary | ICD-10-CM | POA: Insufficient documentation

## 2017-01-14 DIAGNOSIS — Z79899 Other long term (current) drug therapy: Secondary | ICD-10-CM | POA: Diagnosis not present

## 2017-01-14 DIAGNOSIS — R197 Diarrhea, unspecified: Secondary | ICD-10-CM | POA: Insufficient documentation

## 2017-01-14 DIAGNOSIS — N179 Acute kidney failure, unspecified: Secondary | ICD-10-CM | POA: Insufficient documentation

## 2017-01-14 DIAGNOSIS — R109 Unspecified abdominal pain: Secondary | ICD-10-CM | POA: Diagnosis not present

## 2017-01-14 DIAGNOSIS — R55 Syncope and collapse: Secondary | ICD-10-CM | POA: Diagnosis not present

## 2017-01-14 DIAGNOSIS — Z87891 Personal history of nicotine dependence: Secondary | ICD-10-CM | POA: Insufficient documentation

## 2017-01-14 DIAGNOSIS — R42 Dizziness and giddiness: Secondary | ICD-10-CM | POA: Insufficient documentation

## 2017-01-14 DIAGNOSIS — Z96651 Presence of right artificial knee joint: Secondary | ICD-10-CM | POA: Insufficient documentation

## 2017-01-14 DIAGNOSIS — F419 Anxiety disorder, unspecified: Secondary | ICD-10-CM | POA: Insufficient documentation

## 2017-01-14 DIAGNOSIS — R404 Transient alteration of awareness: Secondary | ICD-10-CM | POA: Diagnosis not present

## 2017-01-14 DIAGNOSIS — F329 Major depressive disorder, single episode, unspecified: Secondary | ICD-10-CM | POA: Diagnosis not present

## 2017-01-14 LAB — CBG MONITORING, ED: Glucose-Capillary: 91 mg/dL (ref 65–99)

## 2017-01-14 LAB — COMPREHENSIVE METABOLIC PANEL
ALBUMIN: 4 g/dL (ref 3.5–5.0)
ALK PHOS: 62 U/L (ref 38–126)
ALT: 16 U/L (ref 14–54)
ANION GAP: 10 (ref 5–15)
AST: 19 U/L (ref 15–41)
BILIRUBIN TOTAL: 0.5 mg/dL (ref 0.3–1.2)
BUN: 26 mg/dL — AB (ref 6–20)
CALCIUM: 9.3 mg/dL (ref 8.9–10.3)
CO2: 25 mmol/L (ref 22–32)
CREATININE: 1.08 mg/dL — AB (ref 0.44–1.00)
Chloride: 101 mmol/L (ref 101–111)
GFR calc Af Amer: >60 mL/min (ref >60)
GFR calc non Af Amer: 53 mL/min — ABNORMAL LOW (ref >60)
GLUCOSE: 108 mg/dL — AB (ref 65–99)
Potassium: 3.9 mmol/L (ref 3.5–5.1)
SODIUM: 136 mmol/L (ref 135–145)
TOTAL PROTEIN: 6.6 g/dL (ref 6.5–8.1)

## 2017-01-14 LAB — CBC
HEMATOCRIT: 35.7 % — AB (ref 36.0–46.0)
HEMOGLOBIN: 12 g/dL (ref 12.0–15.0)
MCH: 29.1 pg (ref 26.0–34.0)
MCHC: 33.6 g/dL (ref 30.0–36.0)
MCV: 86.7 fL (ref 78.0–100.0)
Platelets: 197 10*3/uL (ref 150–400)
RBC: 4.12 MIL/uL (ref 3.87–5.11)
RDW: 14.3 % (ref 11.5–15.5)
WBC: 5.7 10*3/uL (ref 4.0–10.5)

## 2017-01-14 LAB — URINALYSIS, ROUTINE W REFLEX MICROSCOPIC
Bilirubin Urine: NEGATIVE
GLUCOSE, UA: NEGATIVE mg/dL
Hgb urine dipstick: NEGATIVE
KETONES UR: NEGATIVE mg/dL
LEUKOCYTES UA: NEGATIVE
Nitrite: NEGATIVE
PH: 7 (ref 5.0–8.0)
Protein, ur: NEGATIVE mg/dL
SPECIFIC GRAVITY, URINE: 1.021 (ref 1.005–1.030)

## 2017-01-14 LAB — TROPONIN I: Troponin I: <0.03 ng/mL (ref <0.03)

## 2017-01-14 LAB — I-STAT TROPONIN, ED: Troponin i, poc: 0 ng/mL (ref 0.00–0.08)

## 2017-01-14 LAB — LIPASE, BLOOD: Lipase: 31 U/L (ref 11–51)

## 2017-01-14 MED ORDER — IOPAMIDOL (ISOVUE-300) INJECTION 61%
INTRAVENOUS | Status: AC
  Filled 2017-01-14: qty 100

## 2017-01-14 MED ORDER — ONDANSETRON 4 MG PO TBDP: 4.0000 mg | ORAL_TABLET | Freq: Three times a day (TID) | ORAL | 0 refills | Status: DC | PRN

## 2017-01-14 MED ORDER — IOPAMIDOL (ISOVUE-300) INJECTION 61%
100.0000 mL | Freq: Once | INTRAVENOUS | Status: AC | PRN
  Administered 2017-01-14: 100 mL via INTRAVENOUS

## 2017-01-14 MED ORDER — SODIUM CHLORIDE 0.9 % IV BOLUS (SEPSIS)
1000.0000 mL | Freq: Once | INTRAVENOUS | Status: AC
  Administered 2017-01-14: 1000 mL via INTRAVENOUS

## 2017-01-14 NOTE — ED Notes (Signed)
EDPA Provider at bedside. 

## 2017-01-14 NOTE — ED Notes (Signed)
AWARE OF NEED FOR URINE SAMPLE 

## 2017-01-14 NOTE — ED Provider Notes (Signed)
[facility] DEPT Provider Note   CSN: [id number] Arrival date & time: 01/14/17  1235     History   Chief Complaint Chief Complaint  Patient presents with  . Near Syncope  . Nausea  . Diarrhea  . Abdominal Pain    HPI Monique Gonzalez is a 61 y.o. female with a PMHx of depression, obesity, HLD, vasovagal syncope related to splenic flexure stricture causing partial bowel obstruction (10/2013 colonoscopy by Dr. Hilarie Fredrickson revealed stricture at splenic flexure, had distal transverse/proximal L colon resection by Dr. Redmond Pulling and states after that she had no further syncopal episodes), and other conditions listed below, who presents to the ED with complaints of near syncopal episode around 11:15 AM.  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  She developed a sudden urge to have a bowel movement, was eventually seated in a wheelchair and taken to the bathroom where she had a large nonbloody watery diarrhea bowel movement.  She states that at that time she had 10/10 constant nonradiating lower abdominal cramping with no known aggravating or alleviating factors, and relieved after the bowel movement.  However her pain has gradually improved.  She also had nausea and dry heaving however she did not vomit.  She was given Zofran which seems to have calmed everything down.  She states that she had prior syncopal episodes for approximately 3 years prior to the summer 2015 when she was finally diagnosed with a partial bowel obstruction and had that fixed, has not had any near syncopal or syncopal episodes since then.  She did not actually have a syncopal episode today, but she got very close.  Her PCP is Dena Billet at [facility].  She denies recent fevers, chills, LOC, CP, SOB, LE swelling, palpitations,  vomiting, constipation, obstipation, melena, hematochezia, hematuria, dysuria, vaginal bleeding/discharge, myalgias, arthralgias, numbness, tingling, focal weakness, or any other complaints at this time. Denies recent travel, sick contacts, suspicious food intake, frequent EtOH use, or frequent NSAID use.    The history is provided by the patient and medical records. No language interpreter was used.  Near Syncope  This is a new problem. The current episode started 1 to 2 hours ago. The problem occurs rarely. The problem has been resolved. Associated symptoms include abdominal pain. Pertinent negatives include no chest pain and no shortness of breath. The symptoms are aggravated by standing. The symptoms are relieved by lying down. Treatments tried: sitting down. The treatment provided moderate relief.  Diarrhea   Associated symptoms include abdominal pain. Pertinent negatives include no vomiting, no chills, no arthralgias and no myalgias.  Abdominal Pain   Associated symptoms include diarrhea and nausea. Pertinent negatives include fever, vomiting, constipation, dysuria, hematuria, arthralgias and myalgias.    Past Medical History:  Diagnosis Date  . Allergy   . Chronic pain    back and knee  . Colitis 09/2012   infectious vs inflammatory.   . Colon stricture ([place])   . Depression   . Hyperlipidemia   . Obesity   . Osteoarthritis   . Osteopenia   . Recurrent cold sores   . Seasonal allergies   . Syncope and collapse 11/30/2012   Normal EEG.  Vaso vagal syncope    Patient Active Problem List   Diagnosis Date Noted  . Anxiety and depression 12/22/2016  .  Insulin resistance 10/15/2016  . Presence of right artificial knee joint 10/14/2016  . Status post total right knee replacement 08/05/2016  . Class 2 obesity without serious comorbidity with body mass index (BMI) of 35.0 to 35.9 in adult 07/23/2016  . Unilateral primary osteoarthritis, right knee 05/20/2016  . Obesity (BMI  35.0-39.9 without comorbidity) 05/06/2016  . Class 2 obesity without serious comorbidity with body mass index (BMI) of 36.0 to 36.9 in adult 04/22/2016  . Class 2 obesity without serious comorbidity with body mass index (BMI) of 37.0 to 37.9 in adult 04/08/2016  . Vitamin D deficiency 04/08/2016  . Shortness of breath 03/25/2016  . Fracture, radius 08/08/2014  . Constipation 09/20/2013  . Syncope and collapse 11/30/2012  . Recurrent cold sores   . Right knee pain 11/23/2011  . Osteoarthritis   . Depression   . Hyperlipidemia     Past Surgical History:  Procedure Laterality Date  . DILATION AND CURETTAGE, DIAGNOSTIC / THERAPEUTIC  1987  . TONSILLECTOMY    . TOTAL KNEE ARTHROPLASTY Right 08/05/2016    OB History    Gravida Para Term Preterm AB Living   4 2 2   2      SAB TAB Ectopic Multiple Live Births   2               Home Medications    Prior to Admission medications   Medication Sig Start Date End Date Taking? Authorizing Provider  acetaminophen (TYLENOL) 650 MG CR tablet Take 1,300 mg by mouth at bedtime.   Yes [provider]  acyclovir cream (ZOVIRAX) 5 % Apply 1 application topically every 3 (three) hours. Patient taking differently: Apply 1 application topically every 3 (three) hours as needed (for cold sores).  11/22/12  Yes Dena Billet B, PA-C  atorvastatin (LIPITOR) 40 MG tablet Take 1 tablet (40 mg total) by mouth daily. Patient taking differently: Take 40 mg by mouth at bedtime.  05/06/16  Yes Orlena Sheldon, PA-C  buPROPion (WELLBUTRIN SR) 200 MG 12 hr tablet Take 1 tablet (200 mg total) by mouth daily. 01/06/17  Yes Beasley, Caren D, MD  cetirizine (ZYRTEC) 10 MG tablet Take 10 mg by mouth at bedtime.   Yes [provider]  citalopram (CELEXA) 20 MG tablet Take 1 tablet (20 mg total) by mouth at bedtime. 01/05/17  Yes Orlena Sheldon, PA-C  clindamycin (CLEOCIN) 150 MG capsule Take 300 mg by mouth as needed (for dental procedures).   Yes [provider]  cyclobenzaprine (FLEXERIL) 10 MG tablet Take 1 tablet (10 mg total) by mouth 3 (three) times daily as needed for muscle spasms. 01/05/17  Yes Dena Billet B, PA-C  docusate sodium (COLACE) 100 MG capsule Take 100 mg 2 (two) times daily by mouth.   Yes [provider]  fluticasone (FLONASE) 50 MCG/ACT nasal spray Place 2 sprays into both nostrils daily. Patient taking differently: Place 2 sprays into both nostrils daily as needed.  05/02/13  Yes Dena Billet B, PA-C  loratadine (CLARITIN) 10 MG tablet Take 10 mg by mouth at bedtime.    Yes [provider]  Multiple Vitamins-Minerals (MULTIVITAMIN,TX-MINERALS) tablet Take 1 tablet daily by mouth.    Yes [provider]  valACYclovir (VALTREX) 1000 MG tablet TAKE 2 TABLETS BY MOUTH EVERY 12 HOURS FOR 2 DOSES AS NEEDED FOR FLARE Patient taking differently: Take 1,000 mg by mouth every 12 (twelve) hours as needed (for cold sores.).  09/19/15  Yes Susy Frizzle,  MD  Vitamin D, Ergocalciferol, (DRISDOL) 50000 units CAPS capsule Take 1 capsule (50,000 Units total) by mouth every 7 (seven) days. 12/17/16  Yes Starlyn Skeans, MD    Family History Family History  Problem Relation Age of Onset  . Stroke Mother   . Arthritis Mother   . Thyroid disease Mother   . Cancer Father   . Arthritis Sister   . Thyroid disease Sister   . Cancer Maternal Grandmother   . Stroke Maternal Grandfather   . Cancer Maternal Grandfather   . Colon cancer Maternal Grandfather   . Cancer Paternal Grandmother   . Heart disease Paternal Grandmother   . Colon cancer Paternal Grandmother   . Stroke Paternal Grandfather   . Arthritis Sister   . Obesity Sister   . Sudden death Neg Hx   . Hypertension Neg Hx   . Hyperlipidemia Neg Hx   . Heart attack Neg Hx   . Diabetes Neg Hx   . Rectal cancer Neg Hx   . Stomach cancer Neg Hx   . Esophageal cancer Neg Hx     Social History Social History   Tobacco Use  . Smoking  status: Former Smoker    Last attempt to quit: 10/27/1993    Years since quitting: 23.2  . Smokeless tobacco: Never Used  Substance Use Topics  . Alcohol use: Yes    Comment: one drink per week  . Drug use: No     Allergies   Penicillins and Sulfa antibiotics   Review of Systems Review of Systems  Constitutional: Positive for diaphoresis. Negative for chills and fever.  Respiratory: Negative for shortness of breath.   Cardiovascular: Positive for near-syncope. Negative for chest pain, palpitations and leg swelling.  Gastrointestinal: Positive for abdominal pain, diarrhea and nausea. Negative for blood in stool, constipation and vomiting.  Genitourinary: Negative for dysuria, hematuria, vaginal bleeding and vaginal discharge.  Musculoskeletal: Negative for arthralgias and myalgias.  Skin: Negative for color change.  Allergic/Immunologic: Negative for immunocompromised state.  Neurological: Positive for light-headedness. Negative for syncope, weakness and numbness.  Psychiatric/Behavioral: Negative for confusion.   All other systems reviewed and are negative for acute change except as noted in the HPI.    Physical Exam Updated Vital Signs BP (!) 118/56 (BP Location: Right Arm)   Pulse 60   Temp 97.6 F (36.4 C) (Oral)   Resp 20   Ht 5\' 11"  (1.803 m)   Wt 116.1 kg (256 lb)   SpO2 100%   BMI 35.70 kg/m   Physical Exam  Constitutional: She is oriented to person, place, and time. Vital signs are normal. She appears well-developed and well-nourished.  Non-toxic appearance. No distress.  Afebrile, nontoxic, NAD  HENT:  Head: Normocephalic and atraumatic.  Mouth/Throat: Oropharynx is clear and moist and mucous membranes are normal.  Eyes: Conjunctivae and EOM are normal. Right eye exhibits no discharge. Left eye exhibits no discharge.  Neck: Normal range of motion. Neck supple.  Cardiovascular: Normal rate, regular rhythm, normal heart sounds and intact distal pulses. Exam  reveals no gallop and no friction rub.  No murmur heard. Pulmonary/Chest: Effort normal and breath sounds normal. No respiratory distress. She has no decreased breath sounds. She has no wheezes. She has no rhonchi. She has no rales.  Abdominal: Soft. Normal appearance. She exhibits no distension. Bowel sounds are decreased. There is generalized tenderness. There is no rigidity, no rebound, no guarding, no CVA tenderness, no tenderness at McBurney's point and negative Murphy's  sign.  Soft, nondistended, +BS throughout although slightly hypoactive in left abdomen, mild generalized abd TTP, no r/g/r, neg murphy's, neg mcburney's, no CVA TTP   Musculoskeletal: Normal range of motion.  MAE x4 Strength and sensation grossly intact in all extremities Distal pulses intact No pedal edema, neg homan's bilaterally   Neurological: She is alert and oriented to person, place, and time. She has normal strength. No sensory deficit.  Skin: Skin is warm, dry and intact. No rash noted.  Psychiatric: She has a normal mood and affect.  Nursing note and vitals reviewed.    Orthostatic VS for the past 24 hrs:  BP- Lying Pulse- Lying BP- Sitting Pulse- Sitting BP- Standing at 0 minutes Pulse- Standing at 0 minutes  01/14/17 1350 126/62 61 119/65 67 119/62 76      ED Treatments / Results  Labs (all labs ordered are listed, but only abnormal results are displayed) Labs Reviewed  CBC - Abnormal; Notable for the following components:      Result Value   HCT 35.7 (*)    All other components within normal limits  COMPREHENSIVE METABOLIC PANEL - Abnormal; Notable for the following components:   Glucose, Bld 108 (*)    BUN 26 (*)    Creatinine, Ser 1.08 (*)    GFR calc non Af Amer 53 (*)    All other components within normal limits  URINALYSIS, ROUTINE W REFLEX MICROSCOPIC  LIPASE, BLOOD  TROPONIN I  CBG MONITORING, ED  I-STAT TROPONIN, ED  I-STAT TROPONIN, ED    EKG  EKG  Interpretation  Date/Time:  Wednesday January 14 2017 12:47:13 EST Ventricular Rate:  54 PR Interval:    QRS Duration: 118 QT Interval:  470 QTC Calculation: 446 R Axis:   15 Text Interpretation:  Sinus rhythm Nonspecific intraventricular conduction delay No significant change since last tracing Confirmed by Addison Lank [id number]) on 01/14/2017 4:26:35 PM       Radiology Ct Abdomen Pelvis W Contrast  Result Date: 01/14/2017 CLINICAL DATA:  Generalized abdominal pain bowel worse in the lower abdomen with nausea x1 day. EXAM: CT ABDOMEN AND PELVIS WITH CONTRAST TECHNIQUE: Multidetector CT imaging of the abdomen and pelvis was performed using the standard protocol following bolus administration of intravenous contrast. CONTRAST:  144mL ISOVUE-300 IOPAMIDOL (ISOVUE-300) INJECTION 61% COMPARISON:  10/09/2013 and priors FINDINGS: Lower chest: No acute abnormality. Hepatobiliary: No focal liver abnormality is seen. No gallstones, gallbladder wall thickening, or biliary dilatation. Pancreas: Unremarkable. No pancreatic ductal dilatation or surrounding inflammatory changes. Spleen: Normal in size without focal abnormality. Adrenals/Urinary Tract: Elongated appearance of the anterior limb of the left adrenal gland, likely developmental and stable. No adrenal mass. Symmetric enhancement of both kidneys without obstructive uropathy or nephrolithiasis. The urinary bladder is partially distended which may account for the mild mural thickening. Stomach/Bowel: The stomach is contracted in appearance. No inflammation. Normal small bowel rotation and ligament of Treitz position. Status post left partial colectomy. Normal distal and terminal ileum. Appendix is not confidently identified but no pericecal inflammation. Vascular/Lymphatic: No significant vascular findings are present. No enlarged abdominal or pelvic lymph nodes. Reproductive: 1.2 cm simple appearing left ovarian cyst. No additional followup  recommendations given its benign appearance and size. Unremarkable right ovary and uterus. Other: No abdominal wall hernia or abnormality. No abdominopelvic ascites. Musculoskeletal: Levoconvex curvature of the lumbar spine with lower lumbar degenerative disc space narrowing L4-5 and to a greater degree at L1-2 and L5-S1. Degenerative grade 1 anterolisthesis of L4 on L5  likely on the basis of facet arthropathy. No pars defects. No suspicious osseous abnormalities. IMPRESSION: 1. Status post left partial colectomy. No bowel obstruction or inflammation. 2. 1.2 cm simple left ovarian cyst. No additional followup necessary. This recommendation follows ACR consensus guidelines: White Paper of the ACR Incidental Findings Committee II on Adnexal Findings. J Am Coll Radiol 2013:10:675-681. 3. Lumbar spondylosis. Electronically Signed   By: Ashley Royalty M.D.   On: 01/14/2017 15:00    Procedures Procedures (including critical care time)  Medications Ordered in ED Medications  iopamidol (ISOVUE-300) 61 % injection (not administered)  sodium chloride 0.9 % bolus 1,000 mL (0 mLs Intravenous Stopped 01/14/17 1724)  iopamidol (ISOVUE-300) 61 % injection 100 mL (100 mLs Intravenous Contrast Given 01/14/17 1429)     Initial Impression / Assessment and Plan / ED Course  I have reviewed the triage vital signs and the nursing notes.  Pertinent labs & imaging results that were available during my care of the patient were reviewed by me and considered in my medical decision making (see chart for details).     61 y.o. female here for near syncopal episode that occurred while she was standing at work assisting with a mole removal. Had abd cramping and large volume diarrhea immediately and dry heaving. Given zofran in route which helped. Similar episodes 52yrs ago when she had a splenic flexure stricture, ultimately required distal transverse/proximal L colon resection. On exam, VSS, NAD, mild generalized abd TTP,  nonperitoneal, slightly hypoactive BS in left abdomen but still present throughout. Given abnormal history of near syncope related to bowel obstruction/stricture, will proceed with CT abd/pelv to r/o this possibility. Will also get labs and orthostatics. Will give fluids, pt declines wanting anything for pain. Will reassess shortly.   4:23 PM CBC WNL. CMP with mildly elevated Cr 1.08, fluids given. Lipase WNL. U/A WNL. Trop negative. EKG without acute ischemic findings, unchanged from prior. CT A/P negative aside from 1.2cm L ovarian cyst and lumbar spondylosis. Orthostatics borderline due to HR increase by 15bpm with going from laying to standing. Will repeat troponin to ensure no cardiac event, and reassess shortly.   6:39 PM Second trop neg. Likely just near syncopal event from vasovagal response vs dehydration. Advised BRAT diet, tylenol/motrin for pain, staying well hydrated, and rx zofran given for nausea. F/up with PCP in 5-7 days for recheck of symptoms. I explained the diagnosis and have given explicit precautions to return to the ER including for any other new or worsening symptoms. The patient understands and accepts the medical plan as it's been dictated and I have answered their questions. Discharge instructions concerning home care and prescriptions have been given. The patient is STABLE and is discharged to home in good condition.    Final Clinical Impressions(s) / ED Diagnoses   Final diagnoses:  Lightheadedness  Nausea  Diarrhea, unspecified type  Abdominal cramping  Vasovagal near syncope  AKI (acute kidney injury) [facility])    ED Discharge Orders        Ordered    ondansetron (ZOFRAN ODT) 4 MG disintegrating tablet  Every 8 hours PRN     01/14/17 [address] [place] [place], [place], [place] 01/14/17 1839    Fatima Blank, MD 01/20/17 1738

## 2017-01-14 NOTE — Discharge Instructions (Signed)
Use zofran as prescribed, as needed for nausea. Alternate between tylenol and motrin as needed for pain. May consider using over the counter tums, maalox, pepto bismol, or other over the counter remedies to help with symptoms. Stay well hydrated with small sips of fluids throughout the day. Follow a BRAT (banana-rice-applesauce-toast) diet as described below for the next 24-48 hours. The 'BRAT' diet is suggested, then progress to diet as tolerated as symptoms abate. Call your regular doctor if bloody stools, persistent diarrhea, vomiting, fever or abdominal pain. Follow up with your regular doctor in 5-7 days for recheck of symptoms. Return to ER for changing or worsening of symptoms.

## 2017-01-14 NOTE — ED Notes (Signed)
Pt stated that she is not able to use the restroom.

## 2017-01-14 NOTE — ED Triage Notes (Signed)
Per [facility]- Pt at work assisting in procedure. She felt lightheaded, N/V/D and near syncopal. EMS- BP 70 palp. Pt at present Denies CP SOB. Pt c/o generalized stomach pain. EKG NSR.Marland Kitchen

## 2017-01-14 NOTE — ED Notes (Signed)
PT GIVEN ZOFRAN IN ROUTE.

## 2017-01-14 NOTE — ED Notes (Signed)
Patient transported to CT 

## 2017-01-26 ENCOUNTER — Ambulatory Visit (INDEPENDENT_AMBULATORY_CARE_PROVIDER_SITE_OTHER): Payer: 59 | Admitting: Family Medicine

## 2017-01-26 VITALS — BP 113/71 | HR 88 | Temp 98.1°F | Ht 71.0 in | Wt 257.0 lb

## 2017-01-26 DIAGNOSIS — Z9189 Other specified personal risk factors, not elsewhere classified: Secondary | ICD-10-CM | POA: Diagnosis not present

## 2017-01-26 DIAGNOSIS — Z6835 Body mass index (BMI) 35.0-35.9, adult: Secondary | ICD-10-CM

## 2017-01-26 DIAGNOSIS — F3289 Other specified depressive episodes: Secondary | ICD-10-CM | POA: Diagnosis not present

## 2017-01-26 DIAGNOSIS — E559 Vitamin D deficiency, unspecified: Secondary | ICD-10-CM | POA: Diagnosis not present

## 2017-01-26 MED ORDER — BUPROPION HCL ER (SR) 200 MG PO TB12: 200.0000 mg | ORAL_TABLET | Freq: Every day | ORAL | 0 refills | Status: DC

## 2017-01-26 MED ORDER — VITAMIN D (ERGOCALCIFEROL) 1.25 MG (50000 UNIT) PO CAPS: 50000.0000 [IU] | ORAL_CAPSULE | ORAL | 0 refills | Status: DC

## 2017-01-27 MED FILL — VIT D2 1.25 MG (50,000 UNIT: 1.25 MG | 28 days supply | Qty: 4 | Fill #0

## 2017-01-27 NOTE — Progress Notes (Signed)
Office: [phone]  /  Fax: [phone]   HPI:   Chief Complaint: OBESITY Monique Gonzalez is here to discuss her progress with her obesity treatment plan. She is on the Category 3 plan and is following her eating plan approximately 75 % of the time. She states she is exercising 0 minutes 0 times per week. Monique Gonzalez has struggled more to follow the category 3 plan. She is getting ready to have celebration eating for Thanksgiving. Monique Gonzalez is not exercising. Her weight is 257 lb (116.6 kg) today and has had a weight gain of 1 pound over a period of 3 weeks since her last visit. She has lost 20 lbs since starting treatment with [name].  Vitamin D deficiency Monique Gonzalez has a diagnosis of vitamin D deficiency. She is currently stable on vit D and is now at goal. She denies nausea, vomiting or muscle weakness.  At risk for osteopenia and osteoporosis Monique Gonzalez is at higher risk of osteopenia and osteoporosis due to vitamin D deficiency.   Depression with emotional eating behaviors Monique Gonzalez is struggling with emotional eating and using food for comfort to the extent that it is negatively impacting her health. She often snacks when she is not hungry. Monique Gonzalez sometimes feels she is out of control and then feels guilty that she made poor food choices. Her mood is stable on Wellbutrin and she denies insomnia. Her blood pressure is currently stable. She has been working on behavior modification techniques to help reduce her emotional eating and has been somewhat successful. She shows no sign of suicidal or homicidal ideations.   Depression screen Monique Gonzalez 2/9 12/22/2016 03/25/2016 11/22/2012  Decreased Interest 3 3 0  Down, Depressed, Hopeless 3 3 0  PHQ - 2 Score 6 6 0  Altered sleeping 1 3 -  Tired, decreased energy 1 3 -  Change in appetite 3 3 -  Feeling bad or failure about yourself  1 3 -  Trouble concentrating 1 3 -  Moving slowly or fidgety/restless 0 3 -  Suicidal thoughts 0 0 -  PHQ-9 Score 13 24 -  Difficult doing work/chores  Somewhat difficult - -     ALLERGIES: Allergies  Allergen Reactions  . Penicillins Rash and Other (See Comments)    Has patient had a PCN reaction causing immediate rash, facial/tongue/throat swelling, SOB or lightheadedness with hypotension: Unknown Has patient had a PCN reaction causing severe rash involving mucus membranes or skin necrosis: Unknown Has patient had a PCN reaction that required hospitalization: No Has patient had a PCN reaction occurring within the last 10 years: No If all of the above answers are "NO", then may proceed with Cephalosporin use. Childhood reaction.  . Sulfa Antibiotics Rash    MEDICATIONS: Current Outpatient Medications on File Prior to Visit  Medication Sig Dispense Refill  . acetaminophen (TYLENOL) 650 MG CR tablet Take 1,300 mg by mouth at bedtime.    Marland Kitchen acyclovir cream (ZOVIRAX) 5 % Apply 1 application topically every 3 (three) hours. (Patient taking differently: Apply 1 application topically every 3 (three) hours as needed (for cold sores). ) 15 g 3  . atorvastatin (LIPITOR) 40 MG tablet Take 1 tablet (40 mg total) by mouth daily. (Patient taking differently: Take 40 mg by mouth at bedtime. ) 90 tablet 3  . cetirizine (ZYRTEC) 10 MG tablet Take 10 mg by mouth at bedtime.    . citalopram (CELEXA) 20 MG tablet Take 1 tablet (20 mg total) by mouth at bedtime. 90 tablet 3  . clindamycin (  CLEOCIN) 150 MG capsule Take 600 mg as needed by mouth (for dental procedures).     . cyclobenzaprine (FLEXERIL) 10 MG tablet Take 1 tablet (10 mg total) by mouth 3 (three) times daily as needed for muscle spasms. 60 tablet 0  . docusate sodium (COLACE) 100 MG capsule Take 100 mg 2 (two) times daily by mouth.    . fluticasone (FLONASE) 50 MCG/ACT nasal spray Place 2 sprays into both nostrils daily. (Patient taking differently: Place 2 sprays into both nostrils daily as needed. ) 16 g 6  . loratadine (CLARITIN) 10 MG tablet Take 10 mg by mouth at bedtime.     . Multiple  Vitamins-Minerals (MULTIVITAMIN,TX-MINERALS) tablet Take 1 tablet daily by mouth.     . ondansetron (ZOFRAN ODT) 4 MG disintegrating tablet Take 1 tablet (4 mg total) every 8 (eight) hours as needed by mouth for nausea or vomiting. 15 tablet 0  . valACYclovir (VALTREX) 1000 MG tablet TAKE 2 TABLETS BY MOUTH EVERY 12 HOURS FOR 2 DOSES AS NEEDED FOR FLARE (Patient taking differently: Take 1,000 mg by mouth every 12 (twelve) hours as needed (for cold sores.). ) 30 tablet 11   No current facility-administered medications on file prior to visit.     PAST MEDICAL HISTORY: Past Medical History:  Diagnosis Date  . Allergy   . Chronic pain    back and knee  . Colitis 09/2012   infectious vs inflammatory.   . Colon stricture ([place])   . Depression   . Hyperlipidemia   . Obesity   . Osteoarthritis   . Osteopenia   . Recurrent cold sores   . Seasonal allergies   . Syncope and collapse 11/30/2012   Normal EEG.  Vaso vagal syncope    PAST SURGICAL HISTORY: Past Surgical History:  Procedure Laterality Date  . COLONOSCOPY N/A 10/12/2013   Performed by [name], MD at [facility] ENDOSCOPY  . DILATION AND CURETTAGE, DIAGNOSTIC / THERAPEUTIC  1987  . LAPAROSCOPIC MOBILIZATION OF SPLENIC FLEXURE, LAPAROSCOPIC EXTENDED LEFT COLECTOMY N/A 10/14/2013   Performed by Greer Pickerel, MD at [place]  . OPEN REDUCTION INTERNAL FIXATION (ORIF) DISTAL RADIAL FRACTURE Right 08/08/2014   Performed by Roseanne Kaufman, MD at [place] Right 08/05/2016   Performed by Mcarthur Rossetti, MD at [place]  . TONSILLECTOMY    . TOTAL KNEE ARTHROPLASTY Right 08/05/2016    SOCIAL HISTORY: Social History   Tobacco Use  . Smoking status: Former Smoker    Last attempt to quit: 10/27/1993    Years since quitting: 23.2  . Smokeless tobacco: Never Used  Substance Use Topics  . Alcohol use: Yes    Comment: one drink per week  . Drug use: No    FAMILY HISTORY: Family History  Problem Relation  Age of Onset  . Stroke Mother   . Arthritis Mother   . Thyroid disease Mother   . Cancer Father   . Arthritis Sister   . Thyroid disease Sister   . Cancer Maternal Grandmother   . Stroke Maternal Grandfather   . Cancer Maternal Grandfather   . Colon cancer Maternal Grandfather   . Cancer Paternal Grandmother   . Heart disease Paternal Grandmother   . Colon cancer Paternal Grandmother   . Stroke Paternal Grandfather   . Arthritis Sister   . Obesity Sister   . Sudden death Neg Hx   . Hypertension Neg Hx   . Hyperlipidemia Neg Hx   .  Heart attack Neg Hx   . Diabetes Neg Hx   . Rectal cancer Neg Hx   . Stomach cancer Neg Hx   . Esophageal cancer Neg Hx     ROS: Review of Systems  Constitutional: Negative for weight loss.  Gastrointestinal: Negative for nausea and vomiting.  Musculoskeletal:       Negative muscle weakness  Psychiatric/Behavioral: Positive for depression. Negative for suicidal ideas. The patient does not have insomnia.     PHYSICAL EXAM: Blood pressure 113/71, pulse 88, temperature 98.1 F (36.7 C), temperature source Oral, height 5\' 11"  (1.803 m), weight 257 lb (116.6 kg), SpO2 99 %. Body mass index is 35.84 kg/m. Physical Exam  Constitutional: She is oriented to person, place, and time. She appears well-developed and well-nourished.  Cardiovascular: Normal rate.  Pulmonary/Chest: Effort normal.  Musculoskeletal: Normal range of motion.  Neurological: She is oriented to person, place, and time.  Skin: Skin is warm and dry.  Psychiatric: She has a normal mood and affect. Her behavior is normal.  Vitals reviewed.   RECENT LABS AND TESTS: BMET    Component Value Date/Time   NA 136 01/14/2017 1319   NA 142 03/25/2016 1341   K 3.9 01/14/2017 1319   CL 101 01/14/2017 1319   CO2 25 01/14/2017 1319   GLUCOSE 108 (H) 01/14/2017 1319   BUN 26 (H) 01/14/2017 1319   BUN 16 03/25/2016 1341   CREATININE 1.08 (H) 01/14/2017 1319   CREATININE 0.87  12/11/2016 0858   CALCIUM 9.3 01/14/2017 1319   GFRNONAA 53 (L) 01/14/2017 1319   GFRNONAA 77 03/20/2015 1420   GFRAA >60 01/14/2017 1319   GFRAA 89 03/20/2015 1420   Lab Results  Component Value Date   HGBA1C 5.2 12/11/2016   HGBA1C 5.3 03/25/2016   HGBA1C (H) 09/11/2009    5.7 (NOTE)                                                                       According to the ADA Clinical Practice Recommendations for 2011, when HbA1c is used as a screening test:   >=6.5%   Diagnostic of Diabetes Mellitus           (if abnormal result  is confirmed)  5.7-6.4%   Increased risk of developing Diabetes Mellitus  References:Diagnosis and Classification of Diabetes Mellitus,Diabetes JEHU,3149,70(YOVZC 1):S62-S69 and Standards of Medical Care in         Diabetes - 2011,Diabetes Care,2011,34  (Suppl 1):S11-S61.   Lab Results  Component Value Date   INSULIN 5.5 03/25/2016   CBC    Component Value Date/Time   WBC 5.7 01/14/2017 1319   RBC 4.12 01/14/2017 1319   HGB 12.0 01/14/2017 1319   HGB 12.5 03/25/2016 1341   HCT 35.7 (L) 01/14/2017 1319   HCT 37.2 03/25/2016 1341   PLT 197 01/14/2017 1319   MCV 86.7 01/14/2017 1319   MCV 88 03/25/2016 1341   MCH 29.1 01/14/2017 1319   MCHC 33.6 01/14/2017 1319   RDW 14.3 01/14/2017 1319   RDW 14.0 03/25/2016 1341   LYMPHSABS 1,215 12/11/2016 0858   LYMPHSABS 1.2 03/25/2016 1341   MONOABS 0.5 03/20/2015 1420   EOSABS 30 12/11/2016 0858   EOSABS 0.0 03/25/2016 1341  BASOSABS 30 12/11/2016 0858   BASOSABS 0.0 03/25/2016 1341   Iron/TIBC/Ferritin/ %Sat No results found for: IRON, TIBC, FERRITIN, IRONPCTSAT Lipid Panel     Component Value Date/Time   CHOL 189 12/11/2016 0858   CHOL 172 03/25/2016 1341   TRIG 120 12/11/2016 0858   HDL 57 12/11/2016 0858   HDL 59 03/25/2016 1341   CHOLHDL 3.3 12/11/2016 0858   VLDL 35 (H) 03/20/2015 1420   LDLCALC 84 03/25/2016 1341   Hepatic Function Panel     Component Value Date/Time   PROT 6.6  01/14/2017 1319   PROT 6.5 03/25/2016 1341   ALBUMIN 4.0 01/14/2017 1319   ALBUMIN 4.2 03/25/2016 1341   AST 19 01/14/2017 1319   ALT 16 01/14/2017 1319   ALKPHOS 62 01/14/2017 1319   BILITOT 0.5 01/14/2017 1319   BILITOT 0.4 03/25/2016 1341      Component Value Date/Time   TSH 1.420 03/25/2016 1341   TSH 1.530 03/20/2015 1420   TSH 2.427 12/04/2014 0908    ASSESSMENT AND PLAN: Vitamin D deficiency - Plan: Vitamin D, Ergocalciferol, (DRISDOL) 50000 units CAPS capsule  Other depression - with emotional eating - Plan: buPROPion (WELLBUTRIN SR) 200 MG 12 hr tablet  At risk for osteoporosis  Class 2 severe obesity with serious comorbidity and body mass index (BMI) of 35.0 to 35.9 in adult, unspecified obesity type ([place])  PLAN:  Vitamin D Deficiency Monique Gonzalez was informed that low vitamin D levels contributes to fatigue and are associated with obesity, breast, and colon cancer. She agrees to continue to take prescription Vit D @50 ,000 IU every week #4 with no refills and will follow up for routine testing of vitamin D, at least 2-3 times per year. She was informed of the risk of over-replacement of vitamin D and agrees to not increase her dose unless he discusses this with [name] first. Monique Gonzalez agrees to follow up with our clinic in 3 weeks.  At risk for osteopenia and osteoporosis Monique Gonzalez is at risk for osteopenia and osteoporosis due to her vitamin D deficiency. She was encouraged to take her vitamin D and follow her higher calcium diet and increase strengthening exercise to help strengthen her bones and decrease her risk of osteopenia and osteoporosis.  Depression with Emotional Eating Behaviors We discussed behavior modification techniques today to help Monique Gonzalez deal with her emotional eating and depression. She has agreed to take Wellbutrin SR 200 mg qd #30 with no refills and will follow up as directed.  Obesity Monique Gonzalez is currently in the action stage of change. As such, her goal is to continue with  weight loss efforts She has agreed to keep a food journal with 1300 to 1500 calories and 80+ grams of protein daily Monique Gonzalez has been instructed to work up to a goal of 150 minutes of combined cardio and strengthening exercise per week for weight loss and overall health benefits. We discussed the following Behavioral Modification Strategies today: increasing lean protein intake, decreasing simple carbohydrates, dealing with family or coworker sabotage, holiday eating strategies and celebration eating strategies  Monique Gonzalez has agreed to follow up with our clinic in 3 weeks. She was informed of the importance of frequent follow up visits to maximize her success with intensive lifestyle modifications for her multiple health conditions.  I, Doreene Nest, am acting as transcriptionist for Dennard Nip, MD  I have reviewed the above documentation for accuracy and completeness, and I agree with the above. -Dennard Nip, MD    OBESITY BEHAVIORAL INTERVENTION VISIT  Today's visit was # 17 out of 22.  Starting weight: 277 lbs Starting date: 03/25/16 Today's weight : 257 lbs Today's date: 01/26/2017 Total lbs lost to date: 20 (Patients must lose 7 lbs in the first 6 months to continue with counseling)   ASK: We discussed the diagnosis of obesity with Monique Gonzalez today and Monique Gonzalez agreed to give [name] permission to discuss obesity behavioral modification therapy today.  ASSESS: Monique Gonzalez has the diagnosis of obesity and her BMI today is 35.86 Monique Gonzalez is in the action stage of change   ADVISE: Monique Gonzalez was educated on the multiple health risks of obesity as well as the benefit of weight loss to improve her health. She was advised of the need for long term treatment and the importance of lifestyle modifications.  AGREE: Multiple dietary modification options and treatment options were discussed and  Monique Gonzalez agreed to keep a food journal with 1300 to 1500 calories and 80+ grams of protein daily We discussed the following  Behavioral Modification Strategies today: increasing lean protein intake, decreasing simple carbohydrates, dealing with family or coworker sabotage, holiday eating strategies and celebration eating strategies

## 2017-02-06 ENCOUNTER — Encounter: Payer: Self-pay | Admitting: Physician Assistant

## 2017-02-06 DIAGNOSIS — B001 Herpesviral vesicular dermatitis: Secondary | ICD-10-CM

## 2017-02-06 MED ORDER — ACYCLOVIR 5 % EX CREA: 1.0000 "application " | TOPICAL_CREAM | CUTANEOUS | 3 refills | Status: DC | PRN

## 2017-02-12 MED FILL — VALACYCLOVIR HCL 500 MG TAB: 500 | 2 days supply | Qty: 16 | Fill #0

## 2017-02-16 ENCOUNTER — Encounter (INDEPENDENT_AMBULATORY_CARE_PROVIDER_SITE_OTHER): Payer: Self-pay | Admitting: Family Medicine

## 2017-02-17 ENCOUNTER — Ambulatory Visit (INDEPENDENT_AMBULATORY_CARE_PROVIDER_SITE_OTHER): Payer: 59 | Admitting: Family Medicine

## 2017-02-17 ENCOUNTER — Encounter (INDEPENDENT_AMBULATORY_CARE_PROVIDER_SITE_OTHER): Payer: Self-pay

## 2017-02-17 MED FILL — BUPROPION HCL SR 200 MG TAB: 200 | 30 days supply | Qty: 30 | Fill #0

## 2017-02-17 MED FILL — ATORVASTATIN 80 MG TABLET: 80 | 90 days supply | Qty: 45 | Fill #1

## 2017-02-18 MED FILL — VIT D2 1.25 MG (50,000 UNIT: 1.25 MG | 28 days supply | Qty: 4 | Fill #0

## 2017-02-23 ENCOUNTER — Encounter: Payer: Self-pay | Admitting: Physician Assistant

## 2017-02-23 DIAGNOSIS — G8929 Other chronic pain: Secondary | ICD-10-CM

## 2017-02-23 DIAGNOSIS — M545 Low back pain, unspecified: Secondary | ICD-10-CM

## 2017-02-23 MED ORDER — CYCLOBENZAPRINE HCL 10 MG PO TABS: 10.0000 mg | ORAL_TABLET | Freq: Three times a day (TID) | ORAL | 0 refills | Status: DC | PRN

## 2017-02-23 MED FILL — CYCLOBENZAPRINE 10 MG TAB: 10 | 30 days supply | Qty: 90 | Fill #0

## 2017-02-26 ENCOUNTER — Encounter (INDEPENDENT_AMBULATORY_CARE_PROVIDER_SITE_OTHER): Payer: Self-pay | Admitting: Family Medicine

## 2017-10-05 ENCOUNTER — Encounter (INDEPENDENT_AMBULATORY_CARE_PROVIDER_SITE_OTHER): Payer: Self-pay | Admitting: Physician Assistant

## 2017-10-14 ENCOUNTER — Ambulatory Visit (INDEPENDENT_AMBULATORY_CARE_PROVIDER_SITE_OTHER): Payer: 59 | Admitting: Physician Assistant

## 2018-05-25 IMAGING — CT CT ABD-PELV W/ CM
2 of 5 series · 16 of 46 positions shown, 18 images · IV contrast (ISOVUE)
Comparison: 10/09/2013 and priors

CLINICAL DATA: Generalized abdominal pain bowel worse in the lower
abdomen with nausea x1 day.

EXAM:
CT ABDOMEN AND PELVIS WITH CONTRAST
TECHNIQUE: Multidetector CT imaging of the abdomen and pelvis was performed
using the standard protocol following bolus administration of
intravenous contrast.
CONTRAST:  100mL [id number] IOPAMIDOL ([id number]) INJECTION 61%

[Series 2: abd/pel with · axial · 0.98mm/px · z∈[-582,-137]mm · 13 of 103 slices shown, 15 images]
[im 7/103  soft-tissue]
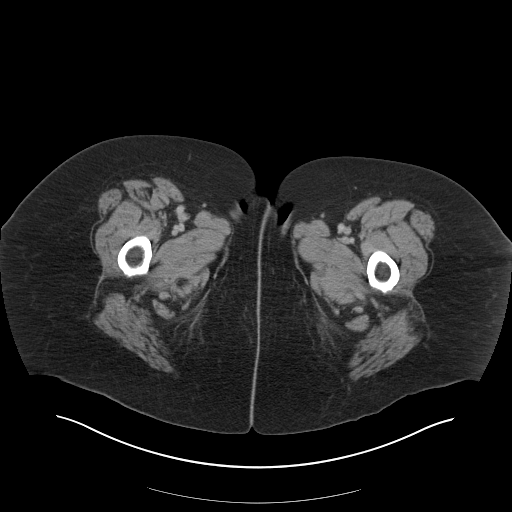
[im 7/103  bone]
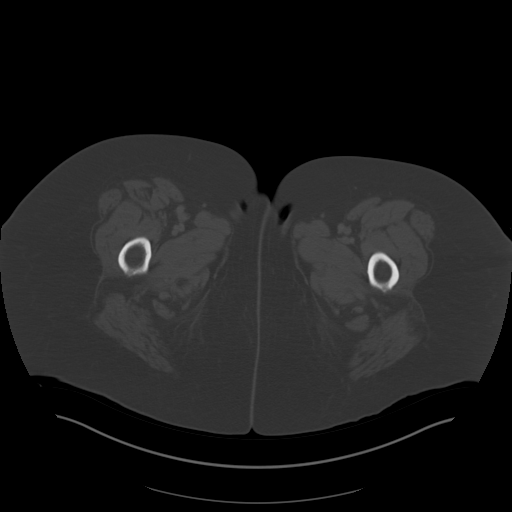
[im 14/103  soft-tissue]
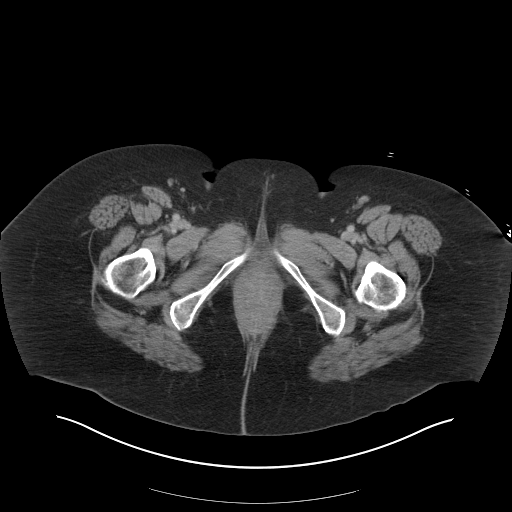
[im 21/103  soft-tissue]
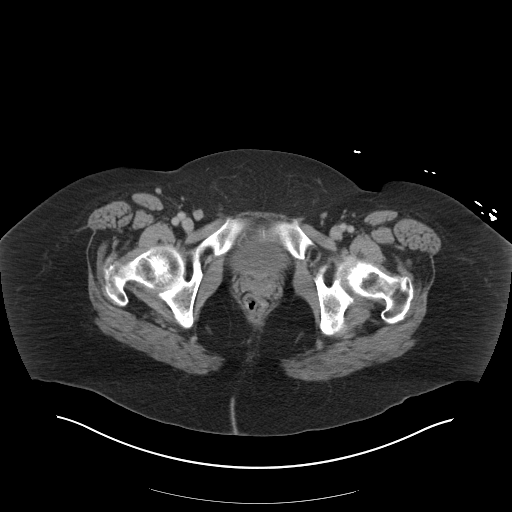
[im 28/103  soft-tissue]
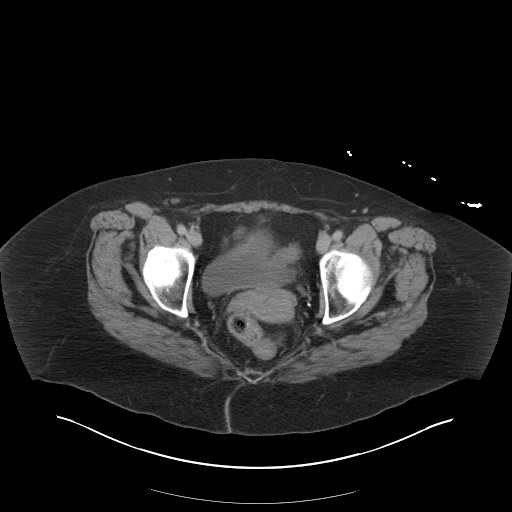
[im 35/103  soft-tissue]
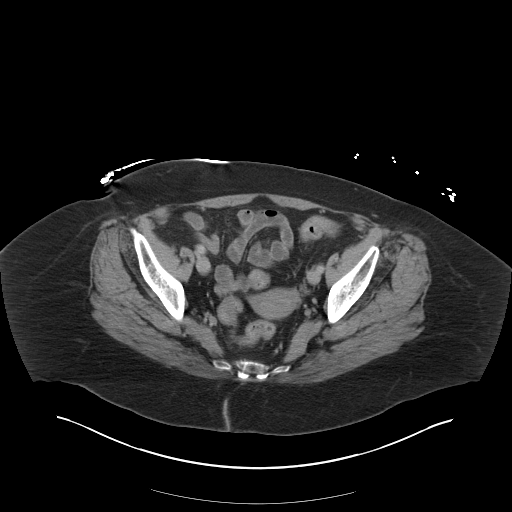
[im 41/103  soft-tissue]
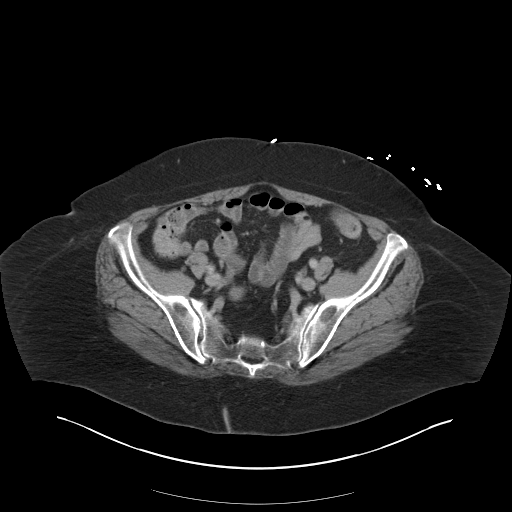
[im 55/103  soft-tissue]
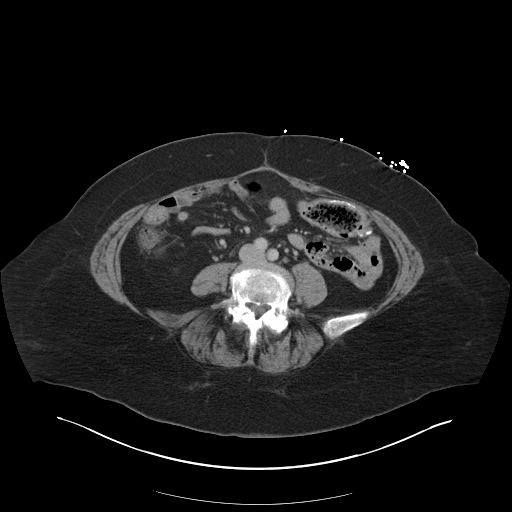
[im 62/103  soft-tissue]
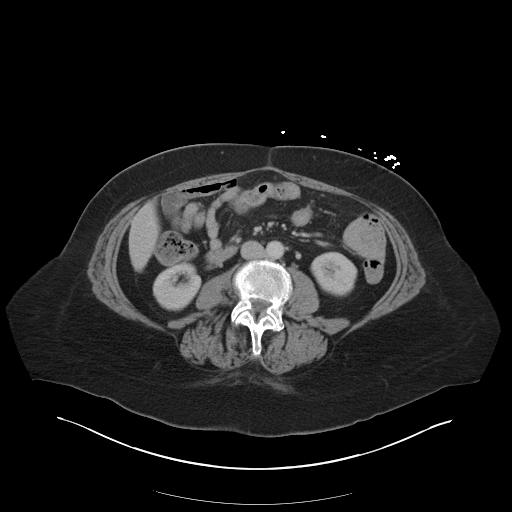
[im 69/103  soft-tissue]
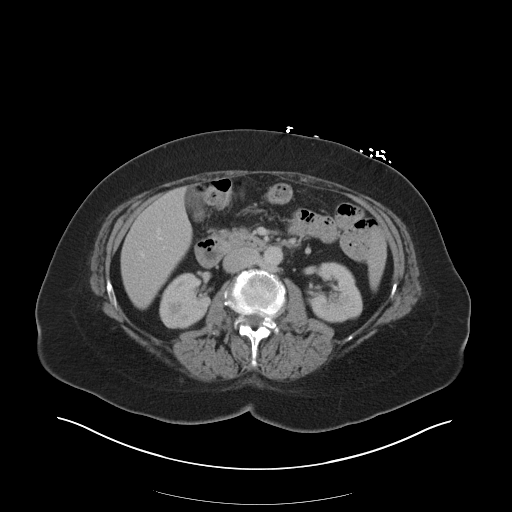
[im 69/103  bone]
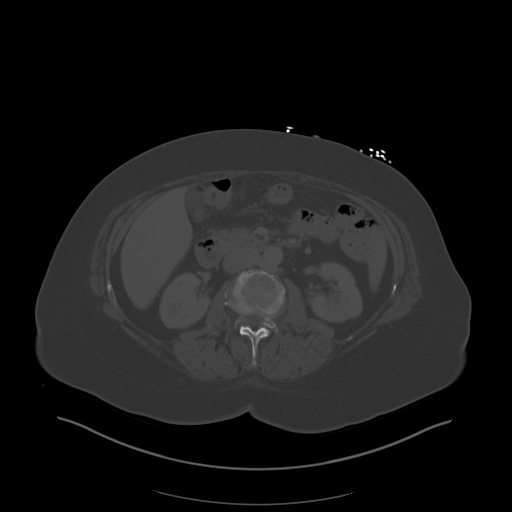
[im 75/103  soft-tissue]
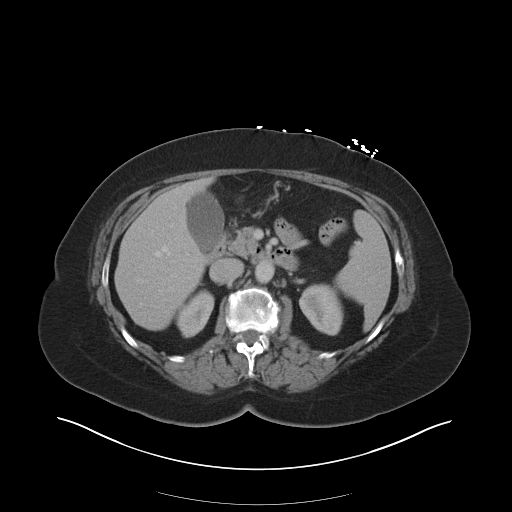
[im 82/103  soft-tissue]
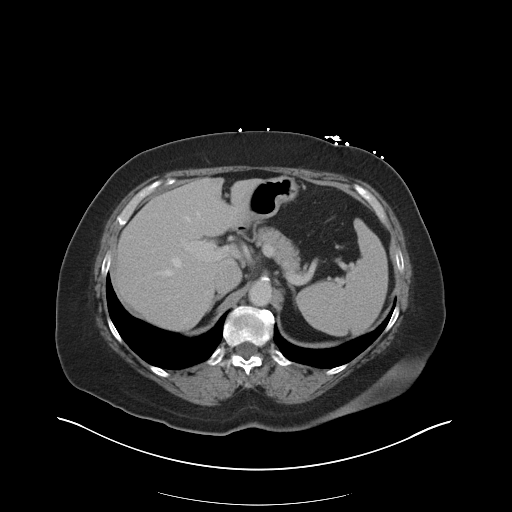
[im 89/103  soft-tissue]
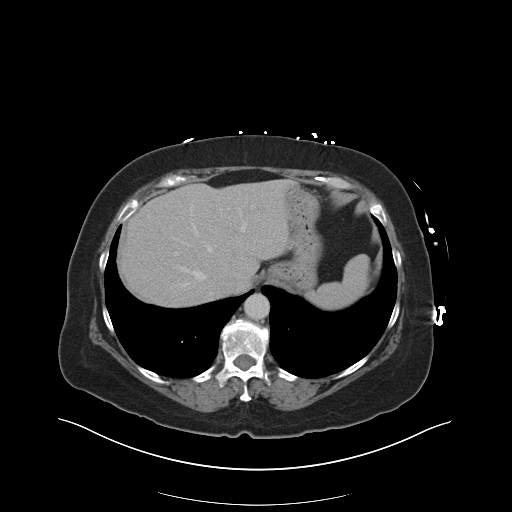
[im 96/103  soft-tissue]
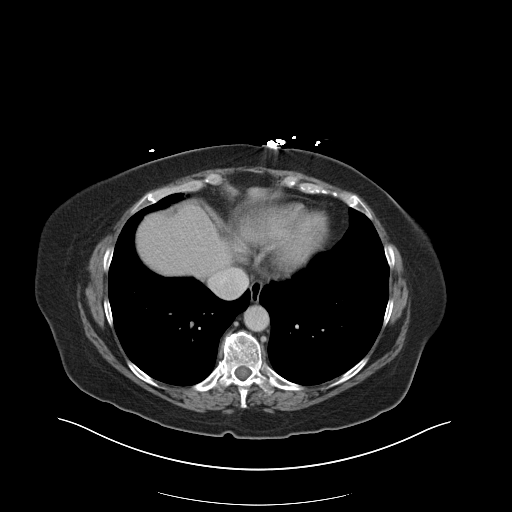

[Series 3: coronal a/|p · coronal · 0.82mm/px · 3 of 159 slices shown]
[im 53/159  soft-tissue]
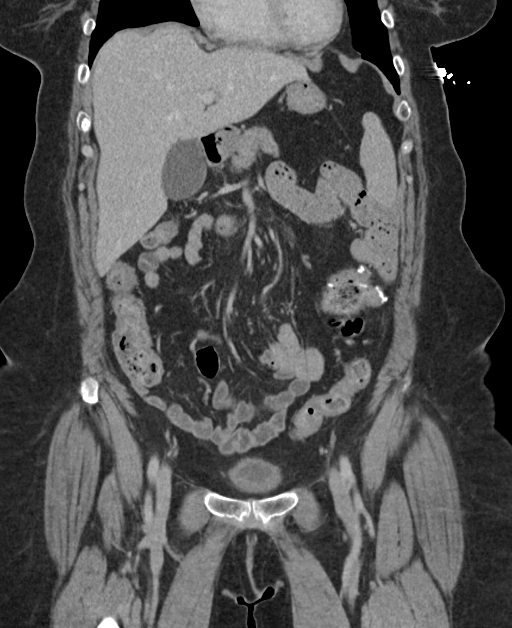
[im 71/159  soft-tissue]
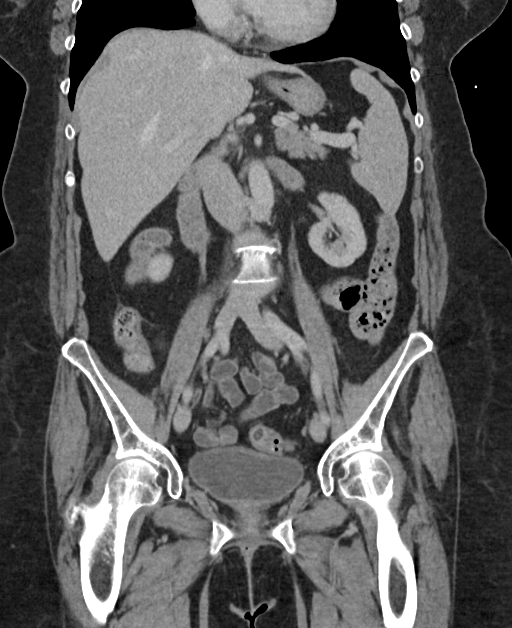
[im 88/159  soft-tissue]
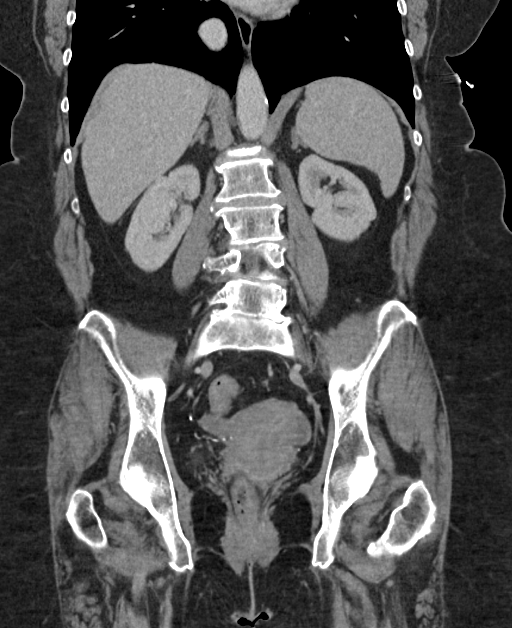

[16 of 46 positions shown; findings below may reference images not displayed]

FINDINGS: Lower chest: No acute abnormality.

Hepatobiliary: No focal liver abnormality is seen. No gallstones,
gallbladder wall thickening, or biliary dilatation.

Pancreas: Unremarkable. No pancreatic ductal dilatation or
surrounding inflammatory changes.

Spleen: Normal in size without focal abnormality.

Adrenals/Urinary Tract: Elongated appearance of the anterior limb of
the left adrenal gland, likely developmental and stable. No adrenal
mass. Symmetric enhancement of both kidneys without obstructive
uropathy or nephrolithiasis. The urinary bladder is partially
distended which may account for the mild mural thickening.

Stomach/Bowel: The stomach is contracted in appearance. No
inflammation. Normal small bowel rotation and ligament of Treitz
position. Status post left partial colectomy. Normal distal and
terminal ileum. Appendix is not confidently identified but no
pericecal inflammation.

Vascular/Lymphatic: No significant vascular findings are present. No
enlarged abdominal or pelvic lymph nodes.

Reproductive: 1.2 cm simple appearing left ovarian cyst. No
additional followup recommendations given its benign appearance and
size. Unremarkable right ovary and uterus.

Other: No abdominal wall hernia or abnormality. No abdominopelvic
ascites.

Musculoskeletal: Levoconvex curvature of the lumbar spine with lower
lumbar degenerative disc space narrowing L4-5 and to a greater
degree at L1-2 and L5-S1. Degenerative grade 1 anterolisthesis of L4
on L5 likely on the basis of facet arthropathy. No pars defects. No
suspicious osseous abnormalities.
IMPRESSION: 1. Status post left partial colectomy. No bowel obstruction or
inflammation.
2. 1.2 cm simple left ovarian cyst. No additional followup
necessary. This recommendation follows ACR consensus guidelines:
White Paper of the ACR Incidental Findings Committee II on Adnexal
Findings. [HOSPITAL] [DATE].
3. Lumbar spondylosis.

## 2018-09-07 ENCOUNTER — Other Ambulatory Visit: Payer: Self-pay | Admitting: Family Medicine

## 2018-09-07 DIAGNOSIS — Z9189 Other specified personal risk factors, not elsewhere classified: Secondary | ICD-10-CM

## 2018-09-07 DIAGNOSIS — E785 Hyperlipidemia, unspecified: Secondary | ICD-10-CM

## 2018-09-07 DIAGNOSIS — Z79899 Other long term (current) drug therapy: Secondary | ICD-10-CM

## 2018-09-07 DIAGNOSIS — E559 Vitamin D deficiency, unspecified: Secondary | ICD-10-CM

## 2018-09-07 DIAGNOSIS — Z0000 Encounter for general adult medical examination without abnormal findings: Secondary | ICD-10-CM

## 2018-09-14 ENCOUNTER — Other Ambulatory Visit: Payer: Medicare Other

## 2018-09-14 ENCOUNTER — Other Ambulatory Visit: Payer: Self-pay

## 2018-09-14 DIAGNOSIS — Z0000 Encounter for general adult medical examination without abnormal findings: Secondary | ICD-10-CM

## 2018-09-14 DIAGNOSIS — E785 Hyperlipidemia, unspecified: Secondary | ICD-10-CM

## 2018-09-14 DIAGNOSIS — E559 Vitamin D deficiency, unspecified: Secondary | ICD-10-CM

## 2018-09-14 DIAGNOSIS — Z9189 Other specified personal risk factors, not elsewhere classified: Secondary | ICD-10-CM

## 2018-09-14 DIAGNOSIS — Z79899 Other long term (current) drug therapy: Secondary | ICD-10-CM

## 2018-09-15 LAB — COMPREHENSIVE METABOLIC PANEL
AG Ratio: 1.8 (calc) (ref 1.0–2.5)
ALT: 16 U/L (ref 6–29)
AST: 15 U/L (ref 10–35)
Albumin: 4.2 g/dL (ref 3.6–5.1)
Alkaline phosphatase (APISO): 58 U/L (ref 37–153)
BUN: 16 mg/dL (ref 7–25)
CO2: 27 mmol/L (ref 20–32)
Calcium: 9.4 mg/dL (ref 8.6–10.4)
Chloride: 103 mmol/L (ref 98–110)
Creat: 0.81 mg/dL (ref 0.50–0.99)
Globulin: 2.3 g/dL (calc) (ref 1.9–3.7)
Glucose, Bld: 102 mg/dL — ABNORMAL HIGH (ref 65–99)
Potassium: 4.1 mmol/L (ref 3.5–5.3)
Sodium: 139 mmol/L (ref 135–146)
Total Bilirubin: 0.5 mg/dL (ref 0.2–1.2)
Total Protein: 6.5 g/dL (ref 6.1–8.1)

## 2018-09-15 LAB — TSH: TSH: 2.14 mIU/L (ref 0.40–4.50)

## 2018-09-15 LAB — LIPID PANEL
Cholesterol: 200 mg/dL — ABNORMAL HIGH (ref <200)
HDL: 54 mg/dL (ref >=50)
LDL Cholesterol (Calc): 114 mg/dL (calc) — ABNORMAL HIGH
Non-HDL Cholesterol (Calc): 146 mg/dL (calc) — ABNORMAL HIGH (ref <130)
Total CHOL/HDL Ratio: 3.7 (calc) (ref <5.0)
Triglycerides: 208 mg/dL — ABNORMAL HIGH (ref <150)

## 2018-09-15 LAB — CBC WITH DIFFERENTIAL/PLATELET
Absolute Monocytes: 348 cells/uL (ref 200–950)
Basophils Absolute: 30 cells/uL (ref 0–200)
Basophils Relative: 0.7 %
Eosinophils Absolute: 30 cells/uL (ref 15–500)
Eosinophils Relative: 0.7 %
HCT: 38.9 % (ref 35.0–45.0)
Hemoglobin: 13 g/dL (ref 11.7–15.5)
Lymphs Abs: 1118 cells/uL (ref 850–3900)
MCH: 30.5 pg (ref 27.0–33.0)
MCHC: 33.4 g/dL (ref 32.0–36.0)
MCV: 91.3 fL (ref 80.0–100.0)
MPV: 9.4 fL (ref 7.5–12.5)
Monocytes Relative: 8.1 %
Neutro Abs: 2774 cells/uL (ref 1500–7800)
Neutrophils Relative %: 64.5 %
Platelets: 221 10*3/uL (ref 140–400)
RBC: 4.26 10*6/uL (ref 3.80–5.10)
RDW: 13.6 % (ref 11.0–15.0)
Total Lymphocyte: 26 %
WBC: 4.3 10*3/uL (ref 3.8–10.8)

## 2018-09-15 LAB — VITAMIN D 25 HYDROXY (VIT D DEFICIENCY, FRACTURES): Vit D, 25-Hydroxy: 74 ng/mL (ref 30–100)

## 2018-09-17 ENCOUNTER — Encounter: Payer: Self-pay | Admitting: Family Medicine

## 2018-09-17 ENCOUNTER — Other Ambulatory Visit: Payer: Self-pay

## 2018-09-17 ENCOUNTER — Ambulatory Visit (INDEPENDENT_AMBULATORY_CARE_PROVIDER_SITE_OTHER): Payer: Medicare Other | Admitting: Family Medicine

## 2018-09-17 VITALS — BP 130/80 | HR 94 | Temp 98.5°F | Resp 16 | Ht 71.0 in | Wt 292.0 lb

## 2018-09-17 DIAGNOSIS — Z0001 Encounter for general adult medical examination with abnormal findings: Secondary | ICD-10-CM

## 2018-09-17 DIAGNOSIS — Z0000 Encounter for general adult medical examination without abnormal findings: Secondary | ICD-10-CM

## 2018-09-17 DIAGNOSIS — Z23 Encounter for immunization: Secondary | ICD-10-CM | POA: Diagnosis not present

## 2018-09-17 DIAGNOSIS — E785 Hyperlipidemia, unspecified: Secondary | ICD-10-CM | POA: Diagnosis not present

## 2018-09-17 MED ORDER — ATORVASTATIN CALCIUM 40 MG PO TABS: 40.0000 mg | ORAL_TABLET | Freq: Every day | ORAL | 3 refills | Status: DC

## 2018-09-17 NOTE — Progress Notes (Signed)
Subjective:    [name], female    DOB: 04/04/1955, 63 y.o.   MRN: [id number]  HPI Patient is a very pleasant 63 year old Caucasian female who presents today for complete physical exam.  Past medical history significant for hyperlipidemia for which she takes Lipitor 40 mg a day.  She also has a history of partial resection of the colon due to a stricture in the left colon causing a recurrent partial bowel obstruction.  Since her abdominal surgery she has had no further gastrointestinal issues.  She recently moved back to [place] from [place] where she was staying to help care for her daughter and her grandchildren.  She has since moved back to [place] and is living with her sisters.  Unfortunately her mother recently died in 07-16-2022.  Otherwise she denies any medical concerns.  She does have 2 seborrheic keratoses that are causing her problems.  There is one at approximate the level of T12 located to the left of the midline.  It is a 4 mm brown warty papule consistent with an SK that frequently gets irritated by her bra strap.  She also has a 3 mm brown warty papule just above her left clavicle that gets irritated by her bra strap.  She would like these removed.  Both lesions were anesthetized with 0.1% lidocaine with epinephrine and were removed using a razor blade.  Hemostasis was achieved with Drysol and a Band-Aid.  Both lesions were clearly benign and they were not sent to pathology. Past Medical History:  Diagnosis Date  . Allergy   . Chronic pain    back and knee  . Colitis 09/2012   infectious vs inflammatory.   . Colon stricture ([place])   . Depression   . Hyperlipidemia   . Obesity   . Osteoarthritis   . Osteopenia   . Recurrent cold sores   . Seasonal allergies   . Syncope and collapse 11/30/2012   Normal EEG.  Vaso vagal syncope   Past Surgical History:  Procedure Laterality Date  . COLON RESECTION N/A 10/14/2013   Procedure: LAPAROSCOPIC MOBILIZATION  OF SPLENIC FLEXURE, LAPAROSCOPIC EXTENDED LEFT COLECTOMY;  Surgeon: [name], MD;  Location: [place];  Service: General;  Laterality: N/A;  . COLONOSCOPY N/A 10/12/2013   Procedure: COLONOSCOPY;  Surgeon: [name], MD;  Location: [facility] END[facility]COPY;  Service: Endoscopy;  Laterality: N/A;  . DILATION AND CURETTAGE, DIAGNOSTIC / THERAPEUTIC  1987  . OPEN REDUCTION INTERNAL FIXATION (ORIF) DISTAL RADIAL FRACTURE Right 08/08/2014   Procedure: OPEN REDUCTION INTERNAL FIXATION (ORIF) DISTAL RADIAL FRACTURE;  Surgeon: Roseanne Kaufman, MD;  Location: [place];  Service: Orthopedics;  Laterality: Right;  DVR Crosslock, MD available sometime around 1430-1500  . TONSILLECTOMY    . TOTAL KNEE ARTHROPLASTY Right 08/05/2016  . TOTAL KNEE ARTHROPLASTY Right 08/05/2016   Procedure: RIGHT TOTAL KNEE ARTHROPLASTY;  Surgeon: Mcarthur Rossetti, MD;  Location: [place];  Service: Orthopedics;  Laterality: Right;   Current Outpatient Medications on File Prior to Visit  Medication Sig Dispense Refill  . acetaminophen (TYLENOL) 650 MG CR tablet Take 1,300 mg by mouth at bedtime.    Marland Kitchen acyclovir cream (ZOVIRAX) 5 % Apply 1 application topically every 3 (three) hours as needed (for cold sores). 5 g 3  . cetirizine (ZYRTEC) 10 MG tablet Take 10 mg by mouth at bedtime.    . clindamycin (CLEOCIN) 150 MG capsule Take 600 mg as needed by mouth (for dental procedures).     Marland Kitchen  cyclobenzaprine (FLEXERIL) 10 MG tablet Take 1 tablet (10 mg total) by mouth 3 (three) times daily as needed for muscle spasms. 90 tablet 0  . fluticasone (FLONASE) 50 MCG/ACT nasal spray Place 2 sprays into both nostrils daily. (Patient taking differently: Place 2 sprays into both nostrils daily as needed. ) 16 g 6  . loratadine (CLARITIN) 10 MG tablet Take 10 mg by mouth at bedtime.     . Multiple Vitamins-Minerals (MULTIVITAMIN,TX-MINERALS) tablet Take 1 tablet daily by mouth.     . valACYclovir (VALTREX) 1000 MG tablet TAKE 2 TABLETS BY MOUTH EVERY 12 HOURS  FOR 2 DOSES AS NEEDED FOR FLARE (Patient taking differently: Take 1,000 mg by mouth every 12 (twelve) hours as needed (for cold sores.). ) 30 tablet 11  . Vitamin D, Ergocalciferol, (DRISDOL) 50000 units CAPS capsule Take 1 capsule (50,000 Units total) every 7 (seven) days by mouth. 4 capsule 0   No current facility-administered medications on file prior to visit.    Allergies  Allergen Reactions  . Penicillins Rash and Other (See Comments)    Has patient had a PCN reaction causing immediate rash, facial/tongue/throat swelling, SOB or lightheadedness with hypotension: Unknown Has patient had a PCN reaction causing severe rash involving mucus membranes or skin necrosis: Unknown Has patient had a PCN reaction that required hospitalization: No Has patient had a PCN reaction occurring within the last 10 years: No If all of the above answers are "NO", then may proceed with Cephalosporin use. Childhood reaction.  . Sulfa Antibiotics Rash   Social History   Socioeconomic History  . Marital status: Divorced    Spouse name: Not on file  . Number of children: 2  . Years of education: college  . Highest education level: Not on file  Occupational History  . 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 Financial resource strain: Not on file  . Food insecurity    Worry: Not on file    Inability: Not on file  . Transportation needs    Medical: Not on file    Non-medical: Not on file  Tobacco Use  . Smoking status: Former Smoker    Quit date: 10/27/1993    Years since quitting: 24.9  . Smokeless tobacco: Never Used  Substance and Sexual Activity  . Alcohol use: Yes    Comment: one drink per week  . Drug use: No  . Sexual activity: Not Currently  Lifestyle  . Physical activity    Days per week: Not on file    Minutes per session: Not on file  . Stress: Not on file  Relationships  . Social Herbalist on phone:  Not on file    Gets together: Not on file    Attends religious service: Not on file    Active member of club or organization: Not on file    Attends meetings of clubs or organizations: Not on file    Relationship status: Not on file  . Intimate partner violence    Fear of current or ex partner: Not on file    Emotionally abused: Not on file    Physically abused: Not on file    Forced sexual activity: Not on file  Other Topics Concern  . Not on file  Social History Narrative  . Not on file   Family History  Problem Relation Age of Onset  . Stroke Mother   .  Arthritis Mother   . Thyroid disease Mother   . Cancer Father   . Arthritis Sister   . Thyroid disease Sister   . Cancer Maternal Grandmother   . Stroke Maternal Grandfather   . Cancer Maternal Grandfather   . Colon cancer Maternal Grandfather   . Cancer Paternal Grandmother   . Heart disease Paternal Grandmother   . Colon cancer Paternal Grandmother   . Stroke Paternal Grandfather   . Arthritis Sister   . Obesity Sister   . Sudden death Neg Hx   . Hypertension Neg Hx   . Hyperlipidemia Neg Hx   . Heart attack Neg Hx   . Diabetes Neg Hx   . Rectal cancer Neg Hx   . Stomach cancer Neg Hx   . Esophageal cancer Neg Hx       Review of Systems  All other systems reviewed and are negative.      Objective:   Physical Exam Vitals signs reviewed.  Constitutional:      General: She is not in acute distress.    Appearance: She is obese. She is not ill-appearing, toxic-appearing or diaphoretic.  HENT:     Head: Normocephalic and atraumatic.     Right Ear: Tympanic membrane, ear canal and external ear normal. There is no impacted cerumen.     Left Ear: Tympanic membrane, ear canal and external ear normal. There is no impacted cerumen.     Nose: Nose normal. No congestion or rhinorrhea.     Mouth/Throat:     Mouth: Mucous membranes are moist.     Pharynx: Oropharynx is clear. No oropharyngeal exudate or posterior  oropharyngeal erythema.  Eyes:     General: No scleral icterus.       Right eye: No discharge.        Left eye: No discharge.     Extraocular Movements: Extraocular movements intact.     Conjunctiva/sclera: Conjunctivae normal.     Pupils: Pupils are equal, round, and reactive to light.  Neck:     Musculoskeletal: Normal range of motion and neck supple. No neck rigidity or muscular tenderness.     Vascular: No carotid bruit.  Cardiovascular:     Rate and Rhythm: Normal rate and regular rhythm.     Pulses: Normal pulses.     Heart sounds: Normal heart sounds. No murmur. No friction rub. No gallop.   Pulmonary:     Effort: Pulmonary effort is normal. No respiratory distress.     Breath sounds: Normal breath sounds. No stridor. No wheezing, rhonchi or rales.  Chest:     Chest wall: No tenderness.  Abdominal:     General: Bowel sounds are normal. There is no distension.     Palpations: Abdomen is soft. There is no mass.     Tenderness: There is no abdominal tenderness. There is no right CVA tenderness, left CVA tenderness, guarding or rebound.     Hernia: No hernia is present.  Musculoskeletal:     Right lower leg: No edema.     Left lower leg: No edema.  Lymphadenopathy:     Cervical: No cervical adenopathy.  Skin:    General: Skin is warm.     Coloration: Skin is not jaundiced or pale.     Findings: No bruising, erythema, lesion or rash.  Neurological:     General: No focal deficit present.     Mental Status: She is alert and oriented to person, place, and time. Mental status is  at baseline.     Cranial Nerves: No cranial nerve deficit.     Sensory: No sensory deficit.     Motor: No weakness.     Coordination: Coordination normal.     Gait: Gait normal.     Deep Tendon Reflexes: Reflexes normal.  Psychiatric:        Mood and Affect: Mood normal.        Behavior: Behavior normal.        Thought Content: Thought content normal.        Judgment: Judgment normal.            Assessment & Plan:  The primary encounter diagnosis was General medical exam. A diagnosis of Hyperlipidemia, unspecified hyperlipidemia type was also pertinent to this visit. Patient's physical exam today is normal except for an elevated BMI.  I have encouraged the patient to pursue diet exercise and weight loss. Appointment on 09/14/2018  Component Date Value Ref Range Status  . Vit D, 25-Hydroxy 09/14/2018 74  30 - 100 ng/mL Final   Comment: Vitamin D Status         25-OH Vitamin D: . Deficiency:                    <20 ng/mL Insufficiency:             20 - 29 ng/mL Optimal:                 > or = 30 ng/mL . For 25-OH Vitamin D testing on patients on  D2-supplementation and patients for whom quantitation  of D2 and D3 fractions is required, the Quest[facility]ure[facility](TM) 25-OH VIT D, (D2,D3), LC/MS/MS is recommended: order  code [id number] (patients >36yrs). See Note 1 . Note 1 . For additional information, please refer to  http://education.[facility].com/faq/FAQ199  (This link is being provided for informational/ educational purposes only.)   . TSH 09/14/2018 2.14  0.40 - 4.50 mIU/L Final  . Cholesterol 09/14/2018 200* <200 mg/dL Final  . HDL 09/14/2018 54  > OR = 50 mg/dL Final  . Triglycerides 09/14/2018 208* <150 mg/dL Final   Comment: . If a non-fasting specimen was collected, consider repeat triglyceride testing on a fasting specimen if clinically indicated.  Yates Decamp et al. J. of Clin. Lipidol. 7001;7:494-496. .   . LDL Cholesterol (Calc) 09/14/2018 114* mg/dL (calc) Final   Comment: Reference range: <100 . Desirable range <100 mg/dL for primary prevention;   <70 mg/dL for patients with CHD or diabetic patients  with > or = 2 CHD risk factors. Marland Kitchen LDL-C is now calculated using the Martin-Hopkins  calculation, which is a validated novel method providing  better accuracy than the Friedewald equation in the  estimation of LDL-C.  Cresenciano Genre et al. Annamaria Helling.  7591;638(46): 2061-2068  (http://education.[facility].com/faq/FAQ164)   . Total CHOL/HDL Ratio 09/14/2018 3.7  <5.0 (calc) Final  . Non-HDL Cholesterol (Calc) 09/14/2018 146* <130 mg/dL (calc) Final   Comment: For patients with diabetes plus 1 major ASCVD risk  factor, treating to a non-HDL-C goal of <100 mg/dL  (LDL-C of <70 mg/dL) is considered a therapeutic  option.   . Glucose, Bld 09/14/2018 102* 65 - 99 mg/dL Final   Comment: .            Fasting reference interval . For someone without known diabetes, a glucose value between 100 and 125 mg/dL is consistent with prediabetes and should be confirmed with a follow-up test. .   . BUN 09/14/2018 16  7 - 25 mg/dL Final  . Creat 09/14/2018 0.81  0.50 - 0.99 mg/dL Final   Comment: For patients >42 years of age, the reference limit for Creatinine is approximately 13% higher for people identified as African-American. .   Havery Moros Ratio 84/16/6063 NOT APPLICABLE  6 - 22 (calc) Final  . Sodium 09/14/2018 139  135 - 146 mmol/L Final  . Potassium 09/14/2018 4.1  3.5 - 5.3 mmol/L Final  . Chloride 09/14/2018 103  98 - 110 mmol/L Final  . CO2 09/14/2018 27  20 - 32 mmol/L Final  . Calcium 09/14/2018 9.4  8.6 - 10.4 mg/dL Final  . Total Protein 09/14/2018 6.5  6.1 - 8.1 g/dL Final  . Albumin 09/14/2018 4.2  3.6 - 5.1 g/dL Final  . Globulin 09/14/2018 2.3  1.9 - 3.7 g/dL (calc) Final  . AG Ratio 09/14/2018 1.8  1.0 - 2.5 (calc) Final  . Total Bilirubin 09/14/2018 0.5  0.2 - 1.2 mg/dL Final  . Alkaline phosphatase (APISO) 09/14/2018 58  37 - 153 U/L Final  . AST 09/14/2018 15  10 - 35 U/L Final  . ALT 09/14/2018 16  6 - 29 U/L Final  . WBC 09/14/2018 4.3  3.8 - 10.8 Thousand/uL Final  . RBC 09/14/2018 4.26  3.80 - 5.10 Million/uL Final  . Hemoglobin 09/14/2018 13.0  11.7 - 15.5 g/dL Final  . HCT 09/14/2018 38.9  35.0 - 45.0 % Final  . MCV 09/14/2018 91.3  80.0 - 100.0 fL Final  . MCH 09/14/2018 30.5  27.0 - 33.0 pg  Final  . MCHC 09/14/2018 33.4  32.0 - 36.0 g/dL Final  . RDW 09/14/2018 13.6  11.0 - 15.0 % Final  . Platelets 09/14/2018 221  140 - 400 Thousand/uL Final  . MPV 09/14/2018 9.4  7.5 - 12.5 fL Final  . Neutro Abs 09/14/2018 2,774  1,500 - 7,800 cells/uL Final  . Lymphs Abs 09/14/2018 1,118  850 - 3,900 cells/uL Final  . Absolute Monocytes 09/14/2018 348  200 - 950 cells/uL Final  . Eosinophils Absolute 09/14/2018 30  15 - 500 cells/uL Final  . Basophils Absolute 09/14/2018 30  0 - 200 cells/uL Final  . Neutrophils Relative % 09/14/2018 64.5  % Final  . Total Lymphocyte 09/14/2018 26.0  % Final  . Monocytes Relative 09/14/2018 8.1  % Final  . Eosinophils Relative 09/14/2018 0.7  % Final  . Basophils Relative 09/14/2018 0.7  % Final   Most recent labs are listed above.  Blood sugar was mildly elevated.  Total cholesterol was 200.  LDL was 114.  Triglycerides were over 200.  I have recommended the patient add fish oil 2000 mg daily in an effort to help lower her LDL cholesterol and her triglycerides.  Blood pressures well controlled today.  Colonoscopy was performed in 2015 and is not due again until 2025.  Mammogram was performed in December in [place] and was normal.  Bone density was performed in 2016 and showed a T score of -0.9.  I would repeat this next year.  Patient received Prevnar 13 today.  She will be due for Pneumovax 23 next year.  She has had Zostavax.  She is also had 1 dose of Shingrix and is awaiting the second dose whenever the pharmacy starts to readminister these vaccines.

## 2018-09-20 NOTE — Addendum Note (Signed)
Addended by: Shary Decamp B on: 09/20/2018 09:57 AM   Modules accepted: Orders

## 2018-10-08 ENCOUNTER — Encounter: Payer: Self-pay | Admitting: Family Medicine

## 2018-10-11 MED ORDER — CITALOPRAM HYDROBROMIDE 20 MG PO TABS: 20.0000 mg | ORAL_TABLET | Freq: Every day | ORAL | 3 refills | Status: DC

## 2018-11-04 ENCOUNTER — Encounter: Payer: Self-pay | Admitting: Family Medicine

## 2019-01-11 ENCOUNTER — Ambulatory Visit (INDEPENDENT_AMBULATORY_CARE_PROVIDER_SITE_OTHER): Payer: Medicare Other | Admitting: Family Medicine

## 2019-01-11 ENCOUNTER — Other Ambulatory Visit: Payer: Self-pay

## 2019-01-11 ENCOUNTER — Encounter: Payer: Self-pay | Admitting: Family Medicine

## 2019-01-11 VITALS — BP 118/78 | HR 104 | Temp 97.0°F | Resp 16 | Ht 71.0 in | Wt 272.0 lb

## 2019-01-11 DIAGNOSIS — S39012A Strain of muscle, fascia and tendon of lower back, initial encounter: Secondary | ICD-10-CM

## 2019-01-11 DIAGNOSIS — K219 Gastro-esophageal reflux disease without esophagitis: Secondary | ICD-10-CM | POA: Diagnosis not present

## 2019-01-11 MED ORDER — CARISOPRODOL 350 MG PO TABS: 350.0000 mg | ORAL_TABLET | Freq: Four times a day (QID) | ORAL | 0 refills | Status: DC | PRN

## 2019-01-11 MED ORDER — PANTOPRAZOLE SODIUM 40 MG PO TBEC: 40.0000 mg | DELAYED_RELEASE_TABLET | Freq: Every day | ORAL | 3 refills | Status: DC

## 2019-01-11 MED ORDER — PREDNISONE 20 MG PO TABS: ORAL_TABLET | ORAL | 0 refills | Status: DC

## 2019-01-11 NOTE — Progress Notes (Signed)
Subjective:    Patient ID: [name], female    DOB: 1955-09-03, 63 y.o.   MRN: [id number]  HPI  Patient injured her back Saturday.  She does not recollect how she did it.  She did not feel a pop or tearing sensation.  However starting Saturday she developed a tight sensation in her lower back roughly at the level of L4.  It hurts in a bandlike pattern at that area.  She is tender to palpation over the spinous process.  She is also tender to palpation over the paraspinal muscles on either side.  She has palpable muscle spasms in those areas.  It hurts for her to fully extend and straighten up.  It also hurts to flex forward.  She denies any numbness or tingling radiating down her legs.  She denies any weakness in her legs.  She denies any saddle anesthesia or bowel or bladder incontinence.  She denies any dysuria or hematuria.  She also reports a chronic longstanding issue with her throat.  She states that she is constantly having to clear her throat.  She reports feeling mucus around her vocal cords typically worse first thing in the morning.  She also has an occasional tickle cough.  Recently she is also noticed an increasing sensation of reflux coming up in her chest.  This is occurring more frequently and is occurring typically in the morning.  She is also over the last week developed a sensation of food sticking in her chest.  She states that it feels like food will not pass through her distal esophagus as easily as it should.  Sometimes it feels like it is hanging.  She is yet to have food become completely stuck.  She denies any hematemesis.  She denies any hemoptysis.  She denies any melena or hematochezia  Past Medical History:  Diagnosis Date  . Allergy   . Chronic pain    back and knee  . Colitis 09/2012   infectious vs inflammatory.   . Colon stricture ([place])   . Depression   . Hyperlipidemia   . Obesity   . Osteoarthritis   . Osteopenia   . Recurrent cold sores   . Seasonal  allergies   . Syncope and collapse 11/30/2012   Normal EEG.  Vaso vagal syncope   Past Surgical History:  Procedure Laterality Date  . COLON RESECTION N/A 10/14/2013   Procedure: LAPAROSCOPIC MOBILIZATION OF SPLENIC FLEXURE, LAPAROSCOPIC EXTENDED LEFT COLECTOMY;  Surgeon: [name], MD;  Location: [place];  Service: General;  Laterality: N/A;  . COLONOSCOPY N/A 10/12/2013   Procedure: COLONOSCOPY;  Surgeon: [name], MD;  Location: [facility] END[facility]COPY;  Service: Endoscopy;  Laterality: N/A;  . DILATION AND CURETTAGE, DIAGNOSTIC / THERAPEUTIC  1987  . OPEN REDUCTION INTERNAL FIXATION (ORIF) DISTAL RADIAL FRACTURE Right 08/08/2014   Procedure: OPEN REDUCTION INTERNAL FIXATION (ORIF) DISTAL RADIAL FRACTURE;  Surgeon: Roseanne Kaufman, MD;  Location: [place];  Service: Orthopedics;  Laterality: Right;  DVR Crosslock, MD available sometime around 1430-1500  . TONSILLECTOMY    . TOTAL KNEE ARTHROPLASTY Right 08/05/2016  . TOTAL KNEE ARTHROPLASTY Right 08/05/2016   Procedure: RIGHT TOTAL KNEE ARTHROPLASTY;  Surgeon: Mcarthur Rossetti, MD;  Location: [place];  Service: Orthopedics;  Laterality: Right;   Current Outpatient Medications on File Prior to Visit  Medication Sig Dispense Refill  . acetaminophen (TYLENOL) 650 MG CR tablet Take 1,300 mg by mouth at bedtime.    Marland Kitchen acyclovir cream (ZOVIRAX)  5 % Apply 1 application topically every 3 (three) hours as needed (for cold sores). 5 g 3  . atorvastatin (LIPITOR) 40 MG tablet Take 1 tablet (40 mg total) by mouth daily. 90 tablet 3  . cetirizine (ZYRTEC) 10 MG tablet Take 10 mg by mouth at bedtime.    . citalopram (CELEXA) 20 MG tablet Take 1 tablet (20 mg total) by mouth at bedtime. 90 tablet 3  . clindamycin (CLEOCIN) 150 MG capsule Take 600 mg as needed by mouth (for dental procedures).     . cyclobenzaprine (FLEXERIL) 10 MG tablet Take 1 tablet (10 mg total) by mouth 3 (three) times daily as needed for muscle spasms. 90 tablet 0  . fluticasone (FLONASE) 50  MCG/ACT nasal spray Place 2 sprays into both nostrils daily. (Patient taking differently: Place 2 sprays into both nostrils daily as needed. ) 16 g 6  . loratadine (CLARITIN) 10 MG tablet Take 10 mg by mouth at bedtime.     . Multiple Vitamins-Minerals (MULTIVITAMIN,TX-MINERALS) tablet Take 1 tablet daily by mouth.     . valACYclovir (VALTREX) 1000 MG tablet TAKE 2 TABLETS BY MOUTH EVERY 12 HOURS FOR 2 DOSES AS NEEDED FOR FLARE (Patient taking differently: Take 1,000 mg by mouth every 12 (twelve) hours as needed (for cold sores.). ) 30 tablet 11  . Vitamin D, Ergocalciferol, (DRISDOL) 50000 units CAPS capsule Take 1 capsule (50,000 Units total) every 7 (seven) days by mouth. 4 capsule 0   No current facility-administered medications on file prior to visit.    Allergies  Allergen Reactions  . Penicillins Rash and Other (See Comments)    Has patient had a PCN reaction causing immediate rash, facial/tongue/throat swelling, SOB or lightheadedness with hypotension: Unknown Has patient had a PCN reaction causing severe rash involving mucus membranes or skin necrosis: Unknown Has patient had a PCN reaction that required hospitalization: No Has patient had a PCN reaction occurring within the last 10 years: No If all of the above answers are "NO", then may proceed with Cephalosporin use. Childhood reaction.  . Sulfa Antibiotics Rash   Social History   Socioeconomic History  . Marital status: Divorced    Spouse name: Not on file  . Number of children: 2  . Years of education: college  . Highest education level: Not on file  Occupational History  . 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 Financial resource strain: Not on file  . Food insecurity    Worry: Not on file    Inability: Not on file  . Transportation needs    Medical: Not on file    Non-medical: Not on file  Tobacco Use  . Smoking status: Former Smoker    Quit  date: 10/27/1993    Years since quitting: 25.2  . Smokeless tobacco: Never Used  Substance and Sexual Activity  . Alcohol use: Yes    Comment: one drink per week  . Drug use: No  . Sexual activity: Not Currently  Lifestyle  . Physical activity    Days per week: Not on file    Minutes per session: Not on file  . Stress: Not on file  Relationships  . Social Herbalist on phone: Not on file    Gets together: Not on file    Attends religious service: Not on file    Active member of club or organization: Not on  file    Attends meetings of clubs or organizations: Not on file    Relationship status: Not on file  . Intimate partner violence    Fear of current or ex partner: Not on file    Emotionally abused: Not on file    Physically abused: Not on file    Forced sexual activity: Not on file  Other Topics Concern  . Not on file  Social History Narrative  . Not on file     Review of Systems  All other systems reviewed and are negative.      Objective:   Physical Exam Vitals signs reviewed.  Constitutional:      Appearance: She is obese.  Cardiovascular:     Rate and Rhythm: Normal rate and regular rhythm.     Heart sounds: Normal heart sounds.  Pulmonary:     Effort: Pulmonary effort is normal. No respiratory distress.     Breath sounds: Normal breath sounds. No wheezing or rhonchi.  Musculoskeletal:     Lumbar back: She exhibits decreased range of motion, tenderness and pain. She exhibits no bony tenderness, no deformity and no spasm.  Neurological:     Mental Status: She is alert.     Motor: No weakness.     Gait: Gait normal.     Deep Tendon Reflexes: Reflexes normal.           Assessment & Plan:  Strain of lumbar region, initial encounter  LPRD (laryngopharyngeal reflux disease)  I believe the patient strained a muscle in her lower back.  She has tried Aleve and Flexeril without any relief.  Therefore we will switch to a prednisone taper pack as  an anti-inflammatory and use Soma 350 mg every 8 hours as needed for muscle spasms.  There are no symptoms or signs of cauda equina syndrome.  Anticipate gradual improvement over the next 2 weeks.  I would start the patient on Protonix for laryngoesophageal reflux disease.  If symptoms or not improving, consider possibly an EGD particular if she develops worsening dysphagia.

## 2019-02-18 LAB — HM MAMMOGRAPHY: HM Mammogram: ABNORMAL — AB (ref 0–4)

## 2019-02-22 ENCOUNTER — Encounter: Payer: Self-pay | Admitting: Family Medicine

## 2019-02-22 ENCOUNTER — Encounter: Payer: Self-pay | Admitting: *Deleted

## 2019-02-28 ENCOUNTER — Other Ambulatory Visit: Payer: Self-pay | Admitting: Radiology

## 2019-02-28 ENCOUNTER — Encounter: Payer: Self-pay | Admitting: Family Medicine

## 2019-03-13 ENCOUNTER — Encounter: Payer: Self-pay | Admitting: Family Medicine

## 2019-03-14 ENCOUNTER — Other Ambulatory Visit: Payer: Self-pay | Admitting: Family Medicine

## 2019-03-14 MED ORDER — TRIAMCINOLONE ACETONIDE 0.1 % EX CREA: 1.0000 "application " | TOPICAL_CREAM | Freq: Two times a day (BID) | CUTANEOUS | 0 refills | Status: DC

## 2019-04-06 ENCOUNTER — Encounter: Payer: Self-pay | Admitting: Family Medicine

## 2019-04-06 DIAGNOSIS — G8929 Other chronic pain: Secondary | ICD-10-CM

## 2019-04-06 DIAGNOSIS — M545 Low back pain, unspecified: Secondary | ICD-10-CM

## 2019-04-06 NOTE — Telephone Encounter (Signed)
Ok to refill 

## 2019-04-07 MED ORDER — CYCLOBENZAPRINE HCL 10 MG PO TABS: 10.0000 mg | ORAL_TABLET | Freq: Three times a day (TID) | ORAL | 0 refills | Status: DC | PRN

## 2019-04-14 ENCOUNTER — Other Ambulatory Visit: Payer: Self-pay

## 2019-04-14 ENCOUNTER — Ambulatory Visit (INDEPENDENT_AMBULATORY_CARE_PROVIDER_SITE_OTHER): Payer: Medicare Other

## 2019-04-14 DIAGNOSIS — Z23 Encounter for immunization: Secondary | ICD-10-CM | POA: Diagnosis not present

## 2019-04-18 ENCOUNTER — Ambulatory Visit: Payer: Medicare Other | Attending: Internal Medicine

## 2019-04-18 DIAGNOSIS — Z23 Encounter for immunization: Secondary | ICD-10-CM | POA: Insufficient documentation

## 2019-04-18 NOTE — Progress Notes (Signed)
   Covid-19 Vaccination Clinic  Name:  Monique Gonzalez    MRN: [id number] DOB: [birth year]  04/18/2019  [name] was observed post Covid-19 immunization for 15 minutes without incidence. She was provided with Vaccine Information Sheet and instruction to access the V-Safe system.   [name] was instructed to call 911 with any severe reactions post vaccine: Marland Kitchen Difficulty breathing  . Swelling of your face and throat  . A fast heartbeat  . A bad rash all over your body  . Dizziness and weakness    Immunizations Administered    Name Date Dose VIS Date Route   Pfizer COVID-19 Vaccine 04/18/2019  4:08 PM 0.3 mL 02/18/2019 Intramuscular   Manufacturer: [place]   Lot: [id number]   [place]: [id number]

## 2019-05-04 ENCOUNTER — Encounter: Payer: Self-pay | Admitting: Family Medicine

## 2019-05-05 MED ORDER — PANTOPRAZOLE SODIUM 40 MG PO TBEC: 40.0000 mg | DELAYED_RELEASE_TABLET | Freq: Every day | ORAL | 4 refills | Status: DC

## 2019-05-13 ENCOUNTER — Ambulatory Visit: Payer: Medicare Other | Attending: Internal Medicine

## 2019-05-13 DIAGNOSIS — Z23 Encounter for immunization: Secondary | ICD-10-CM | POA: Insufficient documentation

## 2019-05-13 NOTE — Progress Notes (Signed)
   Covid-19 Vaccination Clinic  Name:  Monique Gonzalez    MRN: [id number] DOB: 1955-11-01  05/13/2019  [name] was observed post Covid-19 immunization for 15 minutes without incident. She was provided with Vaccine Information Sheet and instruction to access the V-Safe system.   [name] was instructed to call 911 with any severe reactions post vaccine: Marland Kitchen Difficulty breathing  . Swelling of face and throat  . A fast heartbeat  . A bad rash all over body  . Dizziness and weakness   Immunizations Administered    Name Date Dose VIS Date Route   Pfizer COVID-19 Vaccine 05/13/2019  1:20 PM 0.3 mL 02/18/2019 Intramuscular   Manufacturer: [place]   Lot: [id number]   [place]: [id number]

## 2019-06-01 ENCOUNTER — Encounter: Payer: Self-pay | Admitting: Family Medicine

## 2019-06-03 ENCOUNTER — Other Ambulatory Visit: Payer: Self-pay | Admitting: *Deleted

## 2019-06-03 DIAGNOSIS — G8929 Other chronic pain: Secondary | ICD-10-CM

## 2019-06-03 MED ORDER — PANTOPRAZOLE SODIUM 40 MG PO TBEC: 40.0000 mg | DELAYED_RELEASE_TABLET | Freq: Every day | ORAL | 4 refills | Status: DC

## 2019-06-03 MED ORDER — CYCLOBENZAPRINE HCL 10 MG PO TABS: 10.0000 mg | ORAL_TABLET | Freq: Three times a day (TID) | ORAL | 0 refills | Status: DC | PRN

## 2019-06-03 MED ORDER — ATORVASTATIN CALCIUM 40 MG PO TABS: 40.0000 mg | ORAL_TABLET | Freq: Every day | ORAL | 3 refills | Status: DC

## 2019-06-03 MED ORDER — CITALOPRAM HYDROBROMIDE 20 MG PO TABS: 20.0000 mg | ORAL_TABLET | Freq: Every day | ORAL | 3 refills | Status: DC

## 2019-06-03 NOTE — Telephone Encounter (Signed)
Ok to refill to mail order? 

## 2019-06-15 ENCOUNTER — Other Ambulatory Visit: Payer: Self-pay | Admitting: Family Medicine

## 2019-06-15 DIAGNOSIS — G8929 Other chronic pain: Secondary | ICD-10-CM

## 2019-06-15 MED ORDER — PANTOPRAZOLE SODIUM 40 MG PO TBEC: 40.0000 mg | DELAYED_RELEASE_TABLET | Freq: Every day | ORAL | 4 refills | Status: DC

## 2019-06-15 MED ORDER — CITALOPRAM HYDROBROMIDE 20 MG PO TABS: 20.0000 mg | ORAL_TABLET | Freq: Every day | ORAL | 3 refills | Status: DC

## 2019-06-15 MED ORDER — CYCLOBENZAPRINE HCL 10 MG PO TABS: 10.0000 mg | ORAL_TABLET | Freq: Three times a day (TID) | ORAL | 1 refills | Status: DC | PRN

## 2019-06-15 MED ORDER — ATORVASTATIN CALCIUM 40 MG PO TABS: 40.0000 mg | ORAL_TABLET | Freq: Every day | ORAL | 3 refills | Status: DC

## 2019-07-18 ENCOUNTER — Other Ambulatory Visit: Payer: Self-pay | Admitting: Family Medicine

## 2019-07-18 ENCOUNTER — Encounter: Payer: Self-pay | Admitting: Family Medicine

## 2019-07-18 MED ORDER — CLINDAMYCIN HCL 150 MG PO CAPS: 600.0000 mg | ORAL_CAPSULE | ORAL | 1 refills | Status: DC | PRN

## 2019-10-28 ENCOUNTER — Encounter: Payer: Self-pay | Admitting: Family Medicine

## 2019-11-09 ENCOUNTER — Other Ambulatory Visit: Payer: Self-pay

## 2019-11-09 ENCOUNTER — Inpatient Hospital Stay (HOSPITAL_COMMUNITY)
Admission: EM | Admit: 2019-11-09 | Discharge: 2019-11-12 | DRG: 511 | Disposition: A | Discharge status: Home Health | Payer: Medicare Other | Attending: Internal Medicine | Admitting: Internal Medicine

## 2019-11-09 ENCOUNTER — Emergency Department (HOSPITAL_COMMUNITY): Payer: Medicare Other

## 2019-11-09 DIAGNOSIS — S8261XA Displaced fracture of lateral malleolus of right fibula, initial encounter for closed fracture: Principal | ICD-10-CM | POA: Diagnosis present

## 2019-11-09 DIAGNOSIS — S99911A Unspecified injury of right ankle, initial encounter: Secondary | ICD-10-CM | POA: Diagnosis not present

## 2019-11-09 DIAGNOSIS — E559 Vitamin D deficiency, unspecified: Secondary | ICD-10-CM | POA: Diagnosis not present

## 2019-11-09 DIAGNOSIS — Z79899 Other long term (current) drug therapy: Secondary | ICD-10-CM

## 2019-11-09 DIAGNOSIS — T1490XA Injury, unspecified, initial encounter: Secondary | ICD-10-CM

## 2019-11-09 DIAGNOSIS — I2510 Atherosclerotic heart disease of native coronary artery without angina pectoris: Secondary | ICD-10-CM | POA: Diagnosis present

## 2019-11-09 DIAGNOSIS — Z20822 Contact with and (suspected) exposure to covid-19: Secondary | ICD-10-CM | POA: Diagnosis present

## 2019-11-09 DIAGNOSIS — Z8349 Family history of other endocrine, nutritional and metabolic diseases: Secondary | ICD-10-CM | POA: Diagnosis not present

## 2019-11-09 DIAGNOSIS — W19XXXA Unspecified fall, initial encounter: Secondary | ICD-10-CM | POA: Diagnosis not present

## 2019-11-09 DIAGNOSIS — Z880 Allergy status to penicillin: Secondary | ICD-10-CM

## 2019-11-09 DIAGNOSIS — S82831A Other fracture of upper and lower end of right fibula, initial encounter for closed fracture: Secondary | ICD-10-CM

## 2019-11-09 DIAGNOSIS — S52612A Displaced fracture of left ulna styloid process, initial encounter for closed fracture: Secondary | ICD-10-CM | POA: Diagnosis present

## 2019-11-09 DIAGNOSIS — Z6841 Body Mass Index (BMI) 40.0 and over, adult: Secondary | ICD-10-CM

## 2019-11-09 DIAGNOSIS — S52572A Other intraarticular fracture of lower end of left radius, initial encounter for closed fracture: Secondary | ICD-10-CM | POA: Diagnosis present

## 2019-11-09 DIAGNOSIS — S52502A Unspecified fracture of the lower end of left radius, initial encounter for closed fracture: Secondary | ICD-10-CM

## 2019-11-09 DIAGNOSIS — E669 Obesity, unspecified: Secondary | ICD-10-CM | POA: Diagnosis not present

## 2019-11-09 DIAGNOSIS — Z7952 Long term (current) use of systemic steroids: Secondary | ICD-10-CM | POA: Diagnosis not present

## 2019-11-09 DIAGNOSIS — Z8249 Family history of ischemic heart disease and other diseases of the circulatory system: Secondary | ICD-10-CM | POA: Diagnosis not present

## 2019-11-09 DIAGNOSIS — S52552A Other extraarticular fracture of lower end of left radius, initial encounter for closed fracture: Secondary | ICD-10-CM | POA: Diagnosis present

## 2019-11-09 DIAGNOSIS — S82891A Other fracture of right lower leg, initial encounter for closed fracture: Secondary | ICD-10-CM

## 2019-11-09 DIAGNOSIS — Z823 Family history of stroke: Secondary | ICD-10-CM | POA: Diagnosis not present

## 2019-11-09 DIAGNOSIS — E785 Hyperlipidemia, unspecified: Secondary | ICD-10-CM | POA: Diagnosis present

## 2019-11-09 DIAGNOSIS — F418 Other specified anxiety disorders: Secondary | ICD-10-CM | POA: Diagnosis present

## 2019-11-09 DIAGNOSIS — Y9301 Activity, walking, marching and hiking: Secondary | ICD-10-CM | POA: Diagnosis present

## 2019-11-09 DIAGNOSIS — S82899A Other fracture of unspecified lower leg, initial encounter for closed fracture: Secondary | ICD-10-CM | POA: Diagnosis present

## 2019-11-09 DIAGNOSIS — J449 Chronic obstructive pulmonary disease, unspecified: Secondary | ICD-10-CM | POA: Diagnosis present

## 2019-11-09 DIAGNOSIS — G8929 Other chronic pain: Secondary | ICD-10-CM | POA: Diagnosis present

## 2019-11-09 DIAGNOSIS — Z87891 Personal history of nicotine dependence: Secondary | ICD-10-CM

## 2019-11-09 DIAGNOSIS — Z882 Allergy status to sulfonamides status: Secondary | ICD-10-CM

## 2019-11-09 DIAGNOSIS — K219 Gastro-esophageal reflux disease without esophagitis: Secondary | ICD-10-CM | POA: Diagnosis present

## 2019-11-09 DIAGNOSIS — Z8261 Family history of arthritis: Secondary | ICD-10-CM

## 2019-11-09 DIAGNOSIS — Z800 Family history of malignant neoplasm of digestive organs: Secondary | ICD-10-CM | POA: Diagnosis not present

## 2019-11-09 DIAGNOSIS — W010XXA Fall on same level from slipping, tripping and stumbling without subsequent striking against object, initial encounter: Secondary | ICD-10-CM | POA: Diagnosis present

## 2019-11-09 DIAGNOSIS — Z96651 Presence of right artificial knee joint: Secondary | ICD-10-CM | POA: Diagnosis present

## 2019-11-09 DIAGNOSIS — S52592A Other fractures of lower end of left radius, initial encounter for closed fracture: Secondary | ICD-10-CM | POA: Diagnosis not present

## 2019-11-09 DIAGNOSIS — Z6837 Body mass index (BMI) 37.0-37.9, adult: Secondary | ICD-10-CM | POA: Diagnosis not present

## 2019-11-09 HISTORY — DX: Other fracture of unspecified lower leg, initial encounter for closed fracture: S82.899A

## 2019-11-09 HISTORY — DX: Other fracture of right lower leg, initial encounter for closed fracture: S82.891A

## 2019-11-09 LAB — COMPREHENSIVE METABOLIC PANEL
ALT: 20 U/L (ref 0–44)
AST: 22 U/L (ref 15–41)
Albumin: 3.3 g/dL — ABNORMAL LOW (ref 3.5–5.0)
Alkaline Phosphatase: 60 U/L (ref 38–126)
Anion gap: 9 (ref 5–15)
BUN: 13 mg/dL (ref 8–23)
CO2: 25 mmol/L (ref 22–32)
Calcium: 9 mg/dL (ref 8.9–10.3)
Chloride: 106 mmol/L (ref 98–111)
Creatinine, Ser: 0.89 mg/dL (ref 0.44–1.00)
GFR calc Af Amer: >60 mL/min (ref >60)
GFR calc non Af Amer: >60 mL/min (ref >60)
Glucose, Bld: 113 mg/dL — ABNORMAL HIGH (ref 70–99)
Potassium: 4.2 mmol/L (ref 3.5–5.1)
Sodium: 140 mmol/L (ref 135–145)
Total Bilirubin: 0.5 mg/dL (ref 0.3–1.2)
Total Protein: 6 g/dL — ABNORMAL LOW (ref 6.5–8.1)

## 2019-11-09 LAB — CBC WITH DIFFERENTIAL/PLATELET
Abs Immature Granulocytes: 0.08 10*3/uL — ABNORMAL HIGH (ref 0.00–0.07)
Basophils Absolute: 0 10*3/uL (ref 0.0–0.1)
Basophils Relative: 0 %
Eosinophils Absolute: 0 10*3/uL (ref 0.0–0.5)
Eosinophils Relative: 0 %
HCT: 35.8 % — ABNORMAL LOW (ref 36.0–46.0)
Hemoglobin: 11.3 g/dL — ABNORMAL LOW (ref 12.0–15.0)
Immature Granulocytes: 1 %
Lymphocytes Relative: 15 %
Lymphs Abs: 1 10*3/uL (ref 0.7–4.0)
MCH: 28.2 pg (ref 26.0–34.0)
MCHC: 31.6 g/dL (ref 30.0–36.0)
MCV: 89.3 fL (ref 80.0–100.0)
Monocytes Absolute: 0.4 10*3/uL (ref 0.1–1.0)
Monocytes Relative: 6 %
Neutro Abs: 5.1 10*3/uL (ref 1.7–7.7)
Neutrophils Relative %: 78 %
Platelets: 255 10*3/uL (ref 150–400)
RBC: 4.01 MIL/uL (ref 3.87–5.11)
RDW: 13.5 % (ref 11.5–15.5)
WBC: 6.6 10*3/uL (ref 4.0–10.5)
nRBC: 0 % (ref 0.0–0.2)

## 2019-11-09 LAB — CBG MONITORING, ED: Glucose-Capillary: 113 mg/dL — ABNORMAL HIGH (ref 70–99)

## 2019-11-09 LAB — SARS CORONAVIRUS 2 BY RT PCR (HOSPITAL ORDER, PERFORMED IN ~~LOC~~ HOSPITAL LAB): SARS Coronavirus 2: NEGATIVE

## 2019-11-09 MED ORDER — FLUTICASONE PROPIONATE 50 MCG/ACT NA SUSP: 2.0000 | Freq: Every day | NASAL | Status: DC

## 2019-11-09 MED ORDER — FENTANYL CITRATE (PF) 100 MCG/2ML IJ SOLN
INTRAMUSCULAR | Status: AC
  Administered 2019-11-09: 50 ug via INTRAVENOUS
  Filled 2019-11-09: qty 2

## 2019-11-09 MED ORDER — SODIUM CHLORIDE 0.9 % IV BOLUS
500.0000 mL | Freq: Once | INTRAVENOUS | Status: AC
  Administered 2019-11-09: 500 mL via INTRAVENOUS

## 2019-11-09 MED ORDER — HYDROMORPHONE HCL 1 MG/ML IJ SOLN
0.5000 mg | INTRAMUSCULAR | Status: DC | PRN
  Administered 2019-11-09 – 2019-11-10 (×6): 0.5 mg via INTRAVENOUS
  Filled 2019-11-09 (×6): qty 1

## 2019-11-09 MED ORDER — ATORVASTATIN CALCIUM 40 MG PO TABS: 40.0000 mg | ORAL_TABLET | Freq: Every day | ORAL | Status: DC

## 2019-11-09 MED ORDER — CYCLOBENZAPRINE HCL 10 MG PO TABS
10.0000 mg | ORAL_TABLET | Freq: Three times a day (TID) | ORAL | Status: DC | PRN
  Administered 2019-11-11: 10 mg via ORAL
  Filled 2019-11-09: qty 1

## 2019-11-09 MED ORDER — CARISOPRODOL 350 MG PO TABS: 350.0000 mg | ORAL_TABLET | Freq: Four times a day (QID) | ORAL | Status: DC | PRN

## 2019-11-09 MED ORDER — SODIUM CHLORIDE 0.9 % IV SOLN: Freq: Once | INTRAVENOUS | Status: AC

## 2019-11-09 MED ORDER — FENTANYL CITRATE (PF) 100 MCG/2ML IJ SOLN: 50.0000 ug | Freq: Once | INTRAMUSCULAR | Status: AC

## 2019-11-09 MED ORDER — MUPIROCIN 2 % EX OINT
1.0000 "application " | TOPICAL_OINTMENT | Freq: Two times a day (BID) | CUTANEOUS | Status: DC
  Administered 2019-11-09 – 2019-11-12 (×5): 1 via NASAL
  Filled 2019-11-09 (×4): qty 22

## 2019-11-09 MED ORDER — ENOXAPARIN SODIUM 40 MG/0.4ML ~~LOC~~ SOLN: 40.0000 mg | SUBCUTANEOUS | Status: DC

## 2019-11-09 MED ORDER — CITALOPRAM HYDROBROMIDE 20 MG PO TABS
20.0000 mg | ORAL_TABLET | Freq: Every day | ORAL | Status: DC
  Administered 2019-11-09 – 2019-11-11 (×3): 20 mg via ORAL
  Filled 2019-11-09 (×2): qty 1
  Filled 2019-11-09: qty 2

## 2019-11-09 MED ORDER — SUPER HIGH VITAMINS/MINERALS PO TABS: 1.0000 | ORAL_TABLET | Freq: Every day | ORAL | Status: DC

## 2019-11-09 MED ORDER — FENTANYL CITRATE (PF) 100 MCG/2ML IJ SOLN: 50.0000 ug | Freq: Once | INTRAMUSCULAR | Status: DC

## 2019-11-09 MED ORDER — PANTOPRAZOLE SODIUM 40 MG PO TBEC
40.0000 mg | DELAYED_RELEASE_TABLET | Freq: Every day | ORAL | Status: DC
  Administered 2019-11-11 – 2019-11-12 (×2): 40 mg via ORAL
  Filled 2019-11-09 (×3): qty 1

## 2019-11-09 MED ORDER — MORPHINE SULFATE (PF) 4 MG/ML IV SOLN
4.0000 mg | INTRAVENOUS | Status: DC | PRN
  Administered 2019-11-09 – 2019-11-10 (×3): 4 mg via INTRAVENOUS
  Filled 2019-11-09 (×3): qty 1

## 2019-11-09 MED ORDER — LORATADINE 10 MG PO TABS: 10.0000 mg | ORAL_TABLET | Freq: Every day | ORAL | Status: DC

## 2019-11-09 MED ORDER — HYDROCODONE-ACETAMINOPHEN 5-325 MG PO TABS
1.0000 | ORAL_TABLET | Freq: Four times a day (QID) | ORAL | Status: DC | PRN
  Administered 2019-11-11: 2 via ORAL
  Filled 2019-11-09: qty 2

## 2019-11-09 NOTE — ED Triage Notes (Signed)
Pt arrived to ED via EMS from home after falling on concrete in her garage. Per EMS pt was walking from wet concrete to dry concrete when she slipped and fell. Denies LOC, back pain, neck pain. Obvious deformities to L wrist and R ankle. EMS gave 131mcg Fentanyl.

## 2019-11-09 NOTE — ED Notes (Signed)
Came to draw labs on pt. Pt pale, diaphoretic and stated she has a tendency to vagal down when she is in pain. Pain 10/10 . Took b/p [id number] MD and PA notified. PA started fluids and pain meds were pulled from [facility].

## 2019-11-09 NOTE — Anesthesia Preprocedure Evaluation (Addendum)
Anesthesia Evaluation  Patient identified by MRN, date of birth, ID band Patient awake    Reviewed: Allergy & Precautions, NPO status , Patient's Chart, lab work & pertinent test results  Airway Mallampati: III  TM Distance: >3 FB Neck ROM: Full    Dental  (+) Teeth Intact, Dental Advisory Given Altered coloration of many teeth, nothing loose or chipped per pt:   Pulmonary former smoker,  Quit smoking 1995 Never had sleep study   Pulmonary exam normal breath sounds clear to auscultation       Cardiovascular negative cardio ROS Normal cardiovascular exam Rhythm:Regular Rate:Normal     Neuro/Psych PSYCHIATRIC DISORDERS Anxiety Depression negative neurological ROS     GI/Hepatic Neg liver ROS, GERD  Medicated and Controlled,  Endo/Other  Morbid obesityBMI 42  Renal/GU negative Renal ROS  negative genitourinary   Musculoskeletal  (+) Arthritis , Osteoarthritis,  L wrist, R ankle fx Chronic back and knee pain   Abdominal (+) + obese,   Peds negative pediatric ROS (+)  Hematology negative hematology ROS (+) hct 35.8   Anesthesia Other Findings   Reproductive/Obstetrics negative OB ROS                            Anesthesia Physical Anesthesia Plan  ASA: III  Anesthesia Plan: General and Regional   Post-op Pain Management: GA combined w/ Regional for post-op pain   Induction: Intravenous  PONV Risk Score and Plan: 3 and Ondansetron, Dexamethasone, Midazolam and Treatment may vary due to age or medical condition  Airway Management Planned: Oral ETT  Additional Equipment: None  Intra-op Plan:   Post-operative Plan: Extubation in OR  Informed Consent: I have reviewed the patients History and Physical, chart, labs and discussed the procedure including the risks, benefits and alternatives for the proposed anesthesia with the patient or authorized representative who has indicated his/her  understanding and acceptance.     Dental advisory given  Plan Discussed with: CRNA  Anesthesia Plan Comments: (Will block L arm. Right ankle stress xrays to be done under GA to determine if operative intervention is necessary for R ankle. D/w pt nerve block for the right ankle in OR if operative intervention is necessary. )       Anesthesia Quick Evaluation

## 2019-11-09 NOTE — H&P (Signed)
History and Physical    Monique Gonzalez:[id number] DOB: May 24, 1955 DOA: 11/09/2019  PCP: Susy Frizzle, MD (Confirm with patient/family/NH records and if not entered, this has to be entered at [facility] point of entry) Patient coming from: Home  I have personally briefly reviewed patient's old medical records in [place]  Chief Complaint: I tripped and hurt my ankle and wrist  HPI: Monique Gonzalez is a 64 y.o. female with medical history significant of HLD, morbid obesity, depression/anxiety, presented with left wrist pain and deformity and right ankle pain and deformity after mechanical fall.  Patient was bending down to reach piece of paper on the floor outside garage and fell onto wet concrete, immediately hard to crack on her left wrist.  Unable to get up because severe right ankle pain and left wrist pain called 911.  Denies loss of consciousness, no prodromes of lightheadedness evaluation before this happened, she attributed her fall to wet and slippery concrete.  Denied any direct impact of her body, neck or head. ED Course: X-ray showed acute impacted transverse distal radius metaphyseal fracture, with displacement and tilt, minimum displaced ulnar styloid fracture; distal fibular fracture non-displaced  Review of Systems: As per HPI otherwise 14 point review of systems negative.    Past Medical History:  Diagnosis Date   Allergy    Chronic pain    back and knee   Colitis 09/2012   infectious vs inflammatory.    Colon stricture (HCC)    Depression    Hyperlipidemia    Obesity    Osteoarthritis    Osteopenia    Recurrent cold sores    Seasonal allergies    Syncope and collapse 11/30/2012   Normal EEG.  Vaso vagal syncope    Past Surgical History:  Procedure Laterality Date   COLON RESECTION N/A 10/14/2013   Procedure: LAPAROSCOPIC MOBILIZATION OF SPLENIC FLEXURE, LAPAROSCOPIC EXTENDED LEFT COLECTOMY;  Surgeon: [name], MD;  Location: [place];  Service:  General;  Laterality: N/A;   COLONOSCOPY N/A 10/12/2013   Procedure: COLONOSCOPY;  Surgeon: [name], MD;  Location: [facility] ENDOSCOPY;  Service: Endoscopy;  Laterality: N/A;   DILATION AND CURETTAGE, DIAGNOSTIC / THERAPEUTIC  1987   OPEN REDUCTION INTERNAL FIXATION (ORIF) DISTAL RADIAL FRACTURE Right 08/08/2014   Procedure: OPEN REDUCTION INTERNAL FIXATION (ORIF) DISTAL RADIAL FRACTURE;  Surgeon: Roseanne Kaufman, MD;  Location: [place];  Service: Orthopedics;  Laterality: Right;  DVR Crosslock, MD available sometime around 1430-1500   TONSILLECTOMY     TOTAL KNEE ARTHROPLASTY Right 08/05/2016   TOTAL KNEE ARTHROPLASTY Right 08/05/2016   Procedure: RIGHT TOTAL KNEE ARTHROPLASTY;  Surgeon: Mcarthur Rossetti, MD;  Location: [place];  Service: Orthopedics;  Laterality: Right;     reports that she quit smoking about 26 years ago. She has never used smokeless tobacco. She reports current alcohol use. She reports that she does not use drugs.  Allergies  Allergen Reactions   Penicillins Rash and Other (See Comments)    Has patient had a PCN reaction causing immediate rash, facial/tongue/throat swelling, SOB or lightheadedness with hypotension: Unknown Has patient had a PCN reaction causing severe rash involving mucus membranes or skin necrosis: Unknown Has patient had a PCN reaction that required hospitalization: No Has patient had a PCN reaction occurring within the last 10 years: No If all of the above answers are "NO", then may proceed with Cephalosporin use. Childhood reaction.   Sulfa Antibiotics Rash    Family History  Problem Relation Age of Onset   Stroke Mother    Arthritis Mother    Thyroid disease Mother    Cancer Father    Arthritis Sister    Thyroid disease Sister    Cancer Maternal Grandmother    Stroke Maternal Grandfather    Cancer Maternal Grandfather    Colon cancer Maternal Grandfather    Cancer Paternal Grandmother    Heart disease Paternal  Grandmother    Colon cancer Paternal Grandmother    Stroke Paternal Grandfather    Arthritis Sister    Obesity Sister    Sudden death Neg Hx    Hypertension Neg Hx    Hyperlipidemia Neg Hx    Heart attack Neg Hx    Diabetes Neg Hx    Rectal cancer Neg Hx    Stomach cancer Neg Hx    Esophageal cancer Neg Hx      Prior to Admission medications   Medication Sig Start Date End Date Taking? Authorizing Provider  acetaminophen (TYLENOL) 650 MG CR tablet Take 1,300 mg by mouth at bedtime.    [provider]  acyclovir cream (ZOVIRAX) 5 % Apply 1 application topically every 3 (three) hours as needed (for cold sores). 02/06/17   Orlena Sheldon, PA-C  atorvastatin (LIPITOR) 40 MG tablet Take 1 tablet (40 mg total) by mouth daily. 06/15/19   Susy Frizzle, MD  carisoprodol (SOMA) 350 MG tablet Take 1 tablet (350 mg total) by mouth 4 (four) times daily as needed for muscle spasms. 01/11/19   Susy Frizzle, MD  cetirizine (ZYRTEC) 10 MG tablet Take 10 mg by mouth at bedtime.    [provider]  citalopram (CELEXA) 20 MG tablet Take 1 tablet (20 mg total) by mouth at bedtime. 06/15/19   Susy Frizzle, MD  clindamycin (CLEOCIN) 150 MG capsule Take 4 capsules (600 mg total) by mouth as needed (for dental procedures). 07/18/19   Susy Frizzle, MD  cyclobenzaprine (FLEXERIL) 10 MG tablet Take 1 tablet (10 mg total) by mouth 3 (three) times daily as needed for muscle spasms. 06/15/19   Susy Frizzle, MD  fluticasone (FLONASE) 50 MCG/ACT nasal spray Place 2 sprays into both nostrils daily. Patient taking differently: Place 2 sprays into both nostrils daily as needed.  05/02/13   Orlena Sheldon, PA-C  loratadine (CLARITIN) 10 MG tablet Take 10 mg by mouth at bedtime.     [provider]  Multiple Vitamins-Minerals (MULTIVITAMIN,TX-MINERALS) tablet Take 1 tablet daily by mouth.     [provider]  pantoprazole (PROTONIX) 40 MG tablet Take 1 tablet (40  mg total) by mouth daily. 06/15/19   Susy Frizzle, MD  predniSONE (DELTASONE) 20 MG tablet 3 tabs poqday 1-2, 2 tabs poqday 3-4, 1 tab poqday 5-6 01/11/19   Susy Frizzle, MD  triamcinolone cream (KENALOG) 0.1 % Apply 1 application topically 2 (two) times daily. 03/14/19   Susy Frizzle, MD  valACYclovir (VALTREX) 1000 MG tablet TAKE 2 TABLETS BY MOUTH EVERY 12 HOURS FOR 2 DOSES AS NEEDED FOR FLARE Patient taking differently: Take 1,000 mg by mouth every 12 (twelve) hours as needed (for cold sores.).  09/19/15   Susy Frizzle, MD    Physical Exam: Vitals:   11/09/19 1500 11/09/19 1515 11/09/19 1530 11/09/19 1632  BP: (!) 157/75 (!) 160/75 (!) 157/79 105/64  Pulse: 74 75 77 65  Resp: 11 13 11 14   SpO2: 97% 97% 96% 100%  Weight:  132.9 kg  Height:    5\' 10"  (1.778 m)    Constitutional: NAD, calm, comfortable Vitals:   11/09/19 1500 11/09/19 1515 11/09/19 1530 11/09/19 1632  BP: (!) 157/75 (!) 160/75 (!) 157/79 105/64  Pulse: 74 75 77 65  Resp: 11 13 11 14   SpO2: 97% 97% 96% 100%  Weight:    132.9 kg  Height:    5\' 10"  (1.778 m)   Eyes: PERRL, lids and conjunctivae normal ENMT: Mucous membranes are moist. Posterior pharynx clear of any exudate or lesions.Normal dentition.  Neck: normal, supple, no masses, no thyromegaly Respiratory: clear to auscultation bilaterally, no wheezing, no crackles. Normal respiratory effort. No accessory muscle use.  Cardiovascular: Regular rate and rhythm, no murmurs / rubs / gallops. No extremity edema. 2+ pedal pulses. No carotid bruits.  Abdomen: no tenderness, no masses palpated. No hepatosplenomegaly. Bowel sounds positive.  Musculoskeletal: Left forearm and hand in cast, right foreleg splinter, moving fingers and toes Skin: no rashes, lesions, ulcers. No induration Neurologic: CN 2-12 grossly intact. Sensation intact, DTR normal. Strength 5/5 in all 4.  Psychiatric: Normal judgment and insight. Alert and oriented x 3. Normal mood.      Labs on Admission: I have personally reviewed following labs and imaging studies  CBC: Recent Labs  Lab 11/09/19 1622  WBC 6.6  NEUTROABS 5.1  HGB 11.3*  HCT 35.8*  MCV 89.3  PLT 053   Basic Metabolic Panel: Recent Labs  Lab 11/09/19 1622  NA 140  K 4.2  CL 106  CO2 25  GLUCOSE 113*  BUN 13  CREATININE 0.89  CALCIUM 9.0   GFR: Estimated Creatinine Clearance: 92.6 mL/min (by C-G formula based on SCr of 0.89 mg/dL). Liver Function Tests: Recent Labs  Lab 11/09/19 1622  AST 22  ALT 20  ALKPHOS 60  BILITOT 0.5  PROT 6.0*  ALBUMIN 3.3*   No results for input(s): LIPASE, AMYLASE in the last 168 hours. No results for input(s): AMMONIA in the last 168 hours. Coagulation Profile: No results for input(s): INR, PROTIME in the last 168 hours. Cardiac Enzymes: No results for input(s): CKTOTAL, CKMB, CKMBINDEX, TROPONINI in the last 168 hours. BNP (last 3 results) No results for input(s): PROBNP in the last 8760 hours. HbA1C: No results for input(s): HGBA1C in the last 72 hours. CBG: Recent Labs  Lab 11/09/19 1629  GLUCAP 113*   Lipid Profile: No results for input(s): CHOL, HDL, LDLCALC, TRIG, CHOLHDL, LDLDIRECT in the last 72 hours. Thyroid Function Tests: No results for input(s): TSH, T4TOTAL, FREET4, T3FREE, THYROIDAB in the last 72 hours. Anemia Panel: No results for input(s): VITAMINB12, FOLATE, FERRITIN, TIBC, IRON, RETICCTPCT in the last 72 hours. Urine analysis:    Component Value Date/Time   COLORURINE YELLOW 01/14/2017 1550   APPEARANCEUR CLEAR 01/14/2017 1550   LABSPEC 1.021 01/14/2017 1550   PHURINE 7.0 01/14/2017 1550   GLUCOSEU NEGATIVE 01/14/2017 1550   HGBUR NEGATIVE 01/14/2017 1550   BILIRUBINUR NEGATIVE 01/14/2017 1550   KETONESUR NEGATIVE 01/14/2017 1550   PROTEINUR NEGATIVE 01/14/2017 1550   UROBILINOGEN 0.2 10/14/2013 1835   NITRITE NEGATIVE 01/14/2017 1550   LEUKOCYTESUR NEGATIVE 01/14/2017 1550    Radiological Exams on  Admission: DG Forearm Left  Result Date: 11/09/2019 CLINICAL DATA:  Fall, wrist pain. EXAM: LEFT FOREARM - 2 VIEW COMPARISON:  None. FINDINGS: Acute distal radial and ulnar fractures are better characterized on concurrent wrist radiographs. No additional fractures identified. The elbow is located. Soft tissues are unremarkable. IMPRESSION: Acute distal  radial and ulnar fractures are better characterized on concurrent wrist radiographs. No additional fractures identified. Electronically Signed   By: Margaretha Sheffield MD   On: 11/09/2019 13:21   DG Wrist Complete Left  Result Date: 11/09/2019 CLINICAL DATA:  Fall, wrist pain. EXAM: LEFT WRIST - COMPLETE 3+ VIEW COMPARISON:  None. FINDINGS: Acute impacted transverse fracture of the distal radial metaphysis with intra-articular extension and approximately 8 mm of dorsal displacement. Mild dorsal tilt. The distal fracture fragment articulates with the carpal bones. Acute minimally displaced fracture of the ulnar styloid. Soft tissue swelling about the wrist. Severe first CMC degenerative change. Moderate triscaphe degenerative change. IMPRESSION: 1. Acute impacted transverse distal radial metaphyseal fracture with intra-articular extension and dorsal displacement and tilt. 2. Minimally displaced ulnar styloid fracture. 3. Severe first CMC degenerative change. Electronically Signed   By: Margaretha Sheffield MD   On: 11/09/2019 13:26   DG Ankle Complete Right  Result Date: 11/09/2019 CLINICAL DATA:  Fall. EXAM: RIGHT ANKLE - COMPLETE 3+ VIEW COMPARISON:  None. FINDINGS: Nondisplaced fracture distal fibula. No fracture of the tibia. Ankle mortise intact. Small joint effusion. Mild degenerative change in the midfoot.  Mild calcaneal spurring. IMPRESSION: Nondisplaced fracture  distal fibula. Electronically Signed   By: Franchot Gallo M.D.   On: 11/09/2019 13:21   DG Foot Complete Right  Result Date: 11/09/2019 CLINICAL DATA:  Right foot pain laterally after fall  EXAM: RIGHT FOOT COMPLETE - 3+ VIEW COMPARISON:  None. FINDINGS: Possible nondisplaced fracture of the fifth toe proximal phalanx diaphysis. Partially visualized nondisplaced distal right fibular metaphyseal fracture. Mild degenerative changes throughout the midfoot and at the first MTP joint. Diffuse soft tissue swelling predominantly at the ankle. IMPRESSION: 1. Possible nondisplaced fracture of the fifth toe proximal phalanx. Correlate with point tenderness. 2. Partially visualized nondisplaced distal right fibular metaphyseal fracture. See dedicated right ankle views. Electronically Signed   By: Davina Poke D.O.   On: 11/09/2019 13:24    EKG: Pending  Assessment/Plan Active Problems:   Closed right ankle fracture   Ankle fracture  (please populate well all problems here in Problem List. (For example, if patient is on BP meds at home and you resume or decide to hold them, it is a problem that needs to be her. Same for CAD, COPD, HLD and so on)  Left wrist/distal radial and ulnar fracture -Secondary to mechanical fall, in temporary cast, also plan for ORIF tomorrow -pain control -ED try ambulation patient will call patient cannot hold crutches because severe pain secondary to wrist fracture.  Plan to admit patient to hospital for ORIF and PT and trying to use crutches or scooter after surgery. -DVT prophylaxis  Right ankle fracture -Also plan to try closed reduction under fluoroscopy, possible ORIF if fails -PT afterwards  HLD -Statin  Anxiety depression -SSRI  DVT prophylaxis: Lovenox Code Status: Full code Family Communication: None at bedside Disposition Plan: Expect 1-2 days hospital stay for ORIF of left wrist and close reduction of right ankle versus ORIF of right ankle and PT evaluation. Consults called: Orthopedic surgery Admission status: MedSurg admit   Lequita Halt MD [facility] Pager [phone]  11/09/2019, 5:52 PM

## 2019-11-09 NOTE — ED Notes (Signed)
PT S DINNER HAS ARRIVED FROM THE KITCHEN

## 2019-11-09 NOTE — Progress Notes (Signed)
Orthopedic Tech Progress Note Patient Details:  Monique Gonzalez [birth year] [id number]  Ortho Devices Type of Ortho Device: [place]ong splint, Arm sling, CAM walker Ortho Device/Splint Location: LUE, RLE Ortho Device/Splint Interventions: Ordered, Application, Adjustment   Post Interventions Patient Tolerated: Well Instructions Provided: Care of [place] 11/09/2019, 4:20 PM

## 2019-11-09 NOTE — ED Notes (Signed)
Dinner tray ordered . Pt aware

## 2019-11-09 NOTE — Consult Note (Signed)
Reason for Consult:Left wrist/right ankle fxs Referring Physician: Smera Gonzalez is an 64 y.o. female.  HPI: Monique Gonzalez was coming in from getting the paper and slipped on the wet concrete in her garage. She heard a crack and had immediate pain in her left wrist and right ankle. She could not get up and did not try to bear weight. She was brought to the ED where x-rays showed a distal radius and fibula fxs and orthopedic surgery was consulted. She is RHD and retired and lives with her 2 sisters.  Past Medical History:  Diagnosis Date  . Allergy   . Chronic pain    back and knee  . Colitis 09/2012   infectious vs inflammatory.   . Colon stricture ([place])   . Depression   . Hyperlipidemia   . Obesity   . Osteoarthritis   . Osteopenia   . Recurrent cold sores   . Seasonal allergies   . Syncope and collapse 11/30/2012   Normal EEG.  Vaso vagal syncope    Past Surgical History:  Procedure Laterality Date  . COLON RESECTION N/A 10/14/2013   Procedure: LAPAROSCOPIC MOBILIZATION OF SPLENIC FLEXURE, LAPAROSCOPIC EXTENDED LEFT COLECTOMY;  Surgeon: [name], MD;  Location: [place];  Service: General;  Laterality: N/A;  . COLONOSCOPY N/A 10/12/2013   Procedure: COLONOSCOPY;  Surgeon: [name], MD;  Location: [facility] ENDOSCOPY;  Service: Endoscopy;  Laterality: N/A;  . DILATION AND CURETTAGE, DIAGNOSTIC / THERAPEUTIC  1987  . OPEN REDUCTION INTERNAL FIXATION (ORIF) DISTAL RADIAL FRACTURE Right 08/08/2014   Procedure: OPEN REDUCTION INTERNAL FIXATION (ORIF) DISTAL RADIAL FRACTURE;  Surgeon: Roseanne Kaufman, MD;  Location: [place];  Service: Orthopedics;  Laterality: Right;  DVR [place], MD available sometime around 1430-1500  . TONSILLECTOMY    . TOTAL KNEE ARTHROPLASTY Right 08/05/2016  . TOTAL KNEE ARTHROPLASTY Right 08/05/2016   Procedure: RIGHT TOTAL KNEE ARTHROPLASTY;  Surgeon: Mcarthur Rossetti, MD;  Location: [place];  Service: Orthopedics;  Laterality: Right;    Family History   Problem Relation Age of Onset  . Stroke Mother   . Arthritis Mother   . Thyroid disease Mother   . Cancer Father   . Arthritis Sister   . Thyroid disease Sister   . Cancer Maternal Grandmother   . Stroke Maternal Grandfather   . Cancer Maternal Grandfather   . Colon cancer Maternal Grandfather   . Cancer Paternal Grandmother   . Heart disease Paternal Grandmother   . Colon cancer Paternal Grandmother   . Stroke Paternal Grandfather   . Arthritis Sister   . Obesity Sister   . Sudden death Neg Hx   . Hypertension Neg Hx   . Hyperlipidemia Neg Hx   . Heart attack Neg Hx   . Diabetes Neg Hx   . Rectal cancer Neg Hx   . Stomach cancer Neg Hx   . Esophageal cancer Neg Hx     Social History:  reports that she quit smoking about 26 years ago. She has never used smokeless tobacco. She reports current alcohol use. She reports that she does not use drugs.  Allergies:  Allergies  Allergen Reactions  . Penicillins Rash and Other (See Comments)    Has patient had a PCN reaction causing immediate rash, facial/tongue/throat swelling, SOB or lightheadedness with hypotension: Unknown Has patient had a PCN reaction causing severe rash involving mucus membranes or skin necrosis: Unknown Has patient had a PCN reaction that required hospitalization: No Has patient  had a PCN reaction occurring within the last 10 years: No If all of the above answers are "NO", then may proceed with Cephalosporin use. Childhood reaction.  . Sulfa Antibiotics Rash    Medications: I have reviewed the patient's current medications.  No results found for this or any previous visit (from the past 48 hour(s)).  DG Forearm Left  Result Date: 11/09/2019 CLINICAL DATA:  Fall, wrist pain. EXAM: LEFT FOREARM - 2 VIEW COMPARISON:  None. FINDINGS: Acute distal radial and ulnar fractures are better characterized on concurrent wrist radiographs. No additional fractures identified. The elbow is located. Soft tissues are  unremarkable. IMPRESSION: Acute distal radial and ulnar fractures are better characterized on concurrent wrist radiographs. No additional fractures identified. Electronically Signed   By: Margaretha Sheffield MD   On: 11/09/2019 13:21   DG Wrist Complete Left  Result Date: 11/09/2019 CLINICAL DATA:  Fall, wrist pain. EXAM: LEFT WRIST - COMPLETE 3+ VIEW COMPARISON:  None. FINDINGS: Acute impacted transverse fracture of the distal radial metaphysis with intra-articular extension and approximately 8 mm of dorsal displacement. Mild dorsal tilt. The distal fracture fragment articulates with the carpal bones. Acute minimally displaced fracture of the ulnar styloid. Soft tissue swelling about the wrist. Severe first CMC degenerative change. Moderate triscaphe degenerative change. IMPRESSION: 1. Acute impacted transverse distal radial metaphyseal fracture with intra-articular extension and dorsal displacement and tilt. 2. Minimally displaced ulnar styloid fracture. 3. Severe first CMC degenerative change. Electronically Signed   By: Margaretha Sheffield MD   On: 11/09/2019 13:26   DG Ankle Complete Right  Result Date: 11/09/2019 CLINICAL DATA:  Fall. EXAM: RIGHT ANKLE - COMPLETE 3+ VIEW COMPARISON:  None. FINDINGS: Nondisplaced fracture distal fibula. No fracture of the tibia. Ankle mortise intact. Small joint effusion. Mild degenerative change in the midfoot.  Mild calcaneal spurring. IMPRESSION: Nondisplaced fracture  distal fibula. Electronically Signed   By: Franchot Gallo M.D.   On: 11/09/2019 13:21   DG Foot Complete Right  Result Date: 11/09/2019 CLINICAL DATA:  Right foot pain laterally after fall EXAM: RIGHT FOOT COMPLETE - 3+ VIEW COMPARISON:  None. FINDINGS: Possible nondisplaced fracture of the fifth toe proximal phalanx diaphysis. Partially visualized nondisplaced distal right fibular metaphyseal fracture. Mild degenerative changes throughout the midfoot and at the first MTP joint. Diffuse soft tissue  swelling predominantly at the ankle. IMPRESSION: 1. Possible nondisplaced fracture of the fifth toe proximal phalanx. Correlate with point tenderness. 2. Partially visualized nondisplaced distal right fibular metaphyseal fracture. See dedicated right ankle views. Electronically Signed   By: Davina Poke D.O.   On: 11/09/2019 13:24    Review of Systems  HENT: Negative for ear discharge, ear pain, hearing loss and tinnitus.   Eyes: Negative for photophobia and pain.  Respiratory: Negative for cough and shortness of breath.   Cardiovascular: Negative for chest pain.  Gastrointestinal: Negative for abdominal pain, nausea and vomiting.  Genitourinary: Negative for dysuria, flank pain, frequency and urgency.  Musculoskeletal: Positive for arthralgias (Left wrist, right ankle). Negative for back pain, myalgias and neck pain.  Neurological: Negative for dizziness and headaches.  Hematological: Does not bruise/bleed easily.  Psychiatric/Behavioral: The patient is not nervous/anxious.    Blood pressure (!) 131/114, pulse 73, resp. rate 16, SpO2 98 %. Physical Exam Constitutional:      General: She is not in acute distress.    Appearance: She is well-developed. She is not diaphoretic.  HENT:     Head: Normocephalic and atraumatic.  Eyes:  General: No scleral icterus.       Right eye: No discharge.        Left eye: No discharge.     Conjunctiva/sclera: Conjunctivae normal.  Cardiovascular:     Rate and Rhythm: Normal rate and regular rhythm.  Pulmonary:     Effort: Pulmonary effort is normal. No respiratory distress.  Musculoskeletal:     Cervical back: Normal range of motion.     Comments: Left shoulder, elbow, wrist, digits- no skin wounds, severe TTP, no instability, no blocks to motion  Sens  Ax/R/M/U intact, ?R/U mild paresthesia  Mot   Ax/ R/ PIN/ M/ AIN/ U intact  Rad 2+  LLE No traumatic wounds, ecchymosis, or rash  Mod TTP lateral ankle  No knee effusion  Knee stable to  varus/ valgus and anterior/posterior stress  Sens DPN, SPN, TN intact  Motor EHL 5/5  DP 2+, PT 0, No significant edema  Skin:    General: Skin is warm and dry.  Neurological:     Mental Status: She is alert.  Psychiatric:        Behavior: Behavior normal.     Assessment/Plan: Left wrist fx -- Will plan on ORIF tomorrow by either Dr. Doreatha Martin or [place]. NPO after MN. Sugar tong splint for now. Right ankle fx -- Will plan stress fluoroscopy tomorrow with possible ORIF if unstable. Boot, NWB for now. Multiple medical problems including HLD, depression, and GERD -- Home meds    Lisette Abu, PA-C Orthopedic Surgery [phone] 11/09/2019, 2:20 PM

## 2019-11-09 NOTE — ED Provider Notes (Signed)
Monique Gonzalez Provider Note   CSN: [id number] Arrival date & time: 11/09/19  1228     History Chief Complaint  Patient presents with  . Fall    Monique Gonzalez is a 64 y.o. female with a past medical history of obesity, insulin resistance, hyperlipidemia, who presents today for evaluation after a fall.  She reports that she was at home walking on wet concrete when she slipped and fell.  She denies striking her head or passing out.  No pain in her head, neck, back, chest, abdomen or pelvis.  She reports that she caught herself with her left wrist and her right ankle was injured.  She states that her wrist is obviously deformed and she heard and felt her right ankle snap.    HPI     Past Medical History:  Diagnosis Date  . Allergy   . Chronic pain    back and knee  . Colitis 09/2012   infectious vs inflammatory.   . Colon stricture ([place])   . Depression   . Hyperlipidemia   . Obesity   . Osteoarthritis   . Osteopenia   . Recurrent cold sores   . Seasonal allergies   . Syncope and collapse 11/30/2012   Normal EEG.  Vaso vagal syncope    Patient Active Problem List   Diagnosis Date Noted  . Closed right ankle fracture 11/09/2019  . Ankle fracture 11/09/2019  . Closed displaced fracture of styloid process of left ulna   . Anxiety and depression 12/22/2016  . Insulin resistance 10/15/2016  . Presence of right artificial knee joint 10/14/2016  . Status post total right knee replacement 08/05/2016  . Class 2 obesity without serious comorbidity with body mass index (BMI) of 35.0 to 35.9 in adult 07/23/2016  . Unilateral primary osteoarthritis, right knee 05/20/2016  . Obesity (BMI 35.0-39.9 without comorbidity) 05/06/2016  . Class 2 obesity without serious comorbidity with body mass index (BMI) of 36.0 to 36.9 in adult 04/22/2016  . Class 2 obesity without serious comorbidity with body mass index (BMI) of 37.0 to 37.9 in adult 04/08/2016  .  Vitamin D deficiency 04/08/2016  . Shortness of breath 03/25/2016  . Fracture, radius 08/08/2014  . Constipation 09/20/2013  . Syncope and collapse 11/30/2012  . Recurrent cold sores   . Right knee pain 11/23/2011  . Osteoarthritis   . Depression   . Hyperlipidemia     Past Surgical History:  Procedure Laterality Date  . COLON RESECTION N/A 10/14/2013   Procedure: LAPAROSCOPIC MOBILIZATION OF SPLENIC FLEXURE, LAPAROSCOPIC EXTENDED LEFT COLECTOMY;  Surgeon: [name], MD;  Location: [place];  Service: General;  Laterality: N/A;  . COLONOSCOPY N/A 10/12/2013   Procedure: COLONOSCOPY;  Surgeon: [name], MD;  Location: [facility] ENDOSCOPY;  Service: Endoscopy;  Laterality: N/A;  . DILATION AND CURETTAGE, DIAGNOSTIC / THERAPEUTIC  1987  . OPEN REDUCTION INTERNAL FIXATION (ORIF) DISTAL RADIAL FRACTURE Right 08/08/2014   Procedure: OPEN REDUCTION INTERNAL FIXATION (ORIF) DISTAL RADIAL FRACTURE;  Surgeon: Roseanne Kaufman, MD;  Location: [place];  Service: Orthopedics;  Laterality: Right;  DVR Crosslock, MD available sometime around 1430-1500  . TONSILLECTOMY    . TOTAL KNEE ARTHROPLASTY Right 08/05/2016  . TOTAL KNEE ARTHROPLASTY Right 08/05/2016   Procedure: RIGHT TOTAL KNEE ARTHROPLASTY;  Surgeon: Mcarthur Rossetti, MD;  Location: [place];  Service: Orthopedics;  Laterality: Right;     OB History    Gravida  4   Para  2   Term  2   Preterm      AB  2   Living        SAB  2   TAB      Ectopic      Multiple      Live Births              Family History  Problem Relation Age of Onset  . Stroke Mother   . Arthritis Mother   . Thyroid disease Mother   . Cancer Father   . Arthritis Sister   . Thyroid disease Sister   . Cancer Maternal Grandmother   . Stroke Maternal Grandfather   . Cancer Maternal Grandfather   . Colon cancer Maternal Grandfather   . Cancer Paternal Grandmother   . Heart disease Paternal Grandmother   . Colon cancer Paternal Grandmother   . Stroke  Paternal Grandfather   . Arthritis Sister   . Obesity Sister   . Sudden death Neg Hx   . Hypertension Neg Hx   . Hyperlipidemia Neg Hx   . Heart attack Neg Hx   . Diabetes Neg Hx   . Rectal cancer Neg Hx   . Stomach cancer Neg Hx   . Esophageal cancer Neg Hx     Social History   Tobacco Use  . Smoking status: Former Smoker    Quit date: 10/27/1993    Years since quitting: 26.0  . Smokeless tobacco: Never Used  Vaping Use  . Vaping Use: Never used  Substance Use Topics  . Alcohol use: Yes    Comment: one drink per week  . Drug use: No    Home Medications Prior to Admission medications   Medication Sig Start Date End Date Taking? Authorizing Provider  acetaminophen (TYLENOL) 650 MG CR tablet Take 1,300 mg by mouth at bedtime.   Yes [provider]  atorvastatin (LIPITOR) 40 MG tablet Take 1 tablet (40 mg total) by mouth daily. Patient taking differently: Take 40 mg by mouth at bedtime.  06/15/19  Yes Susy Frizzle, MD  CALCIUM PO Take 1 tablet by mouth daily with breakfast.   Yes [provider]  cetirizine (ZYRTEC) 10 MG tablet Take 10 mg by mouth See admin instructions. Take 10 mg by mouth at bedtime when not taking Claritin   Yes [provider]  Cholecalciferol (VITAMIN D-3) 25 MCG (1000 UT) CAPS Take 1,000 Units by mouth daily with breakfast.   Yes [provider]  citalopram (CELEXA) 20 MG tablet Take 1 tablet (20 mg total) by mouth at bedtime. 06/15/19  Yes Susy Frizzle, MD  clindamycin (CLEOCIN) 150 MG capsule Take 4 capsules (600 mg total) by mouth as needed (for dental procedures). Patient taking differently: Take 600 mg by mouth See admin instructions. Take 600 mg by mouth one hour prior to dental appointments/procedures 07/18/19  Yes Susy Frizzle, MD  cyclobenzaprine (FLEXERIL) 10 MG tablet Take 1 tablet (10 mg total) by mouth 3 (three) times daily as needed for muscle spasms. Patient taking differently: Take 10 mg by  mouth See admin instructions. Take 10 mg by mouth at bedtime and an additional 10 mg two times a day as needed for muscle spasms 06/15/19  Yes Susy Frizzle, MD  loratadine (CLARITIN) 10 MG tablet Take 10 mg by mouth at bedtime.    Yes [provider]  Multiple Vitamins-Minerals (ONE-A-DAY WOMENS PO) Take 1 tablet by mouth daily with breakfast.   Yes  [provider]  pantoprazole (PROTONIX) 40 MG tablet Take 1 tablet (40 mg total) by mouth daily. Patient taking differently: Take 40 mg by mouth daily before breakfast.  06/15/19  Yes Pickard, Cammie Mcgee, MD  triamcinolone cream (KENALOG) 0.1 % Apply 1 application topically 2 (two) times daily. Patient taking differently: Apply 1 application topically 2 (two) times daily as needed (for itching).  03/14/19  Yes Susy Frizzle, MD  valACYclovir (VALTREX) 1000 MG tablet TAKE 2 TABLETS BY MOUTH EVERY 12 HOURS FOR 2 DOSES AS NEEDED FOR FLARE Patient taking differently: Take 1,000 mg by mouth every 12 (twelve) hours as needed (for cold sores).  09/19/15  Yes Susy Frizzle, MD  acyclovir cream (ZOVIRAX) 5 % Apply 1 application topically every 3 (three) hours as needed (for cold sores). Patient not taking: Reported on 11/09/2019 02/06/17   Orlena Sheldon, PA-C  carisoprodol (SOMA) 350 MG tablet Take 1 tablet (350 mg total) by mouth 4 (four) times daily as needed for muscle spasms. Patient not taking: Reported on 11/09/2019 01/11/19   Susy Frizzle, MD  fluticasone [facility]) 50 MCG/ACT nasal spray Place 2 sprays into both nostrils daily. Patient not taking: Reported on 11/09/2019 05/02/13   Orlena Sheldon, PA-C  Multiple Vitamins-Minerals (MULTIVITAMIN,TX-MINERALS) tablet Take 1 tablet daily by mouth.     [provider]  predniSONE (DELTASONE) 20 MG tablet 3 tabs poqday 1-2, 2 tabs poqday 3-4, 1 tab poqday 5-6 Patient not taking: Reported on 11/09/2019 01/11/19   Susy Frizzle, MD    Allergies    Penicillins and Sulfa  antibiotics  Review of Systems   Review of Systems  Constitutional: Negative for chills and fever.  HENT: Negative for congestion.   Respiratory: Negative for cough and shortness of breath.   Cardiovascular: Negative for chest pain.  Gastrointestinal: Negative for abdominal pain, diarrhea, nausea and vomiting.  Musculoskeletal: Negative for back pain and neck pain.       Pain, swelling and deformity of left wrist, right ankle.   Neurological: Negative for weakness, numbness and headaches.  All other systems reviewed and are negative.   Physical Exam Updated Vital Signs BP 105/64 (BP Location: Right Arm)   Pulse 65   Resp 14   Ht 5\' 10"  (1.778 m)   Wt 132.9 kg   SpO2 100%   BMI 42.04 kg/m   Physical Exam Vitals and nursing note reviewed.  Constitutional:      General: She is not in acute distress.    Appearance: She is well-developed. She is not diaphoretic.  HENT:     Head: Normocephalic and atraumatic.  Eyes:     General: No scleral icterus.       Right eye: No discharge.        Left eye: No discharge.     Conjunctiva/sclera: Conjunctivae normal.  Cardiovascular:     Rate and Rhythm: Normal rate and regular rhythm.     Comments: Unable to palpate left radial pulse due to pain, Capilary refill is under 2 seconds to fingers on right hand.  2+ DP pulse to right foot.  Unable to palpate PT pulse secondary to pain and edema. Pulmonary:     Effort: Pulmonary effort is normal. No respiratory distress.     Breath sounds: No stridor.  Abdominal:     General: There is no distension.     Tenderness: There is no abdominal tenderness.  Musculoskeletal:        General: No deformity.  Cervical back: Normal range of motion and neck supple. No rigidity or tenderness.     Comments: Obvious deformity of the left wrist.  Left upper extremity with no tenderness to palpation over shoulder, humerus, or left elbow.  Tenderness and deformity is from the mid forearm distal.  No specific  tenderness over fingers or palm of left hand.  Right ankle with a splint in place.  Tenderness to palpation in the right ankle without obvious deformity, however exam is limited by body habitus.  No pain or tenderness with palpation of the right foot.  She is able to move the toes on her bilateral feet.  No pain in right proximal lower leg, right knee or bilateral hips.  Skin:    General: Skin is warm and dry.  Neurological:     General: No focal deficit present.     Mental Status: She is alert.     Sensory: No sensory deficit.     Motor: No abnormal muscle tone.  Psychiatric:        Mood and Affect: Mood normal.        Behavior: Behavior normal.     ED Results / Procedures / Treatments   Labs (all labs ordered are listed, but only abnormal results are displayed) Labs Reviewed  COMPREHENSIVE METABOLIC PANEL - Abnormal; Notable for the following components:      Result Value   Glucose, Bld 113 (*)    Total Protein 6.0 (*)    Albumin 3.3 (*)    All other components within normal limits  CBC WITH DIFFERENTIAL/PLATELET - Abnormal; Notable for the following components:   Hemoglobin 11.3 (*)    HCT 35.8 (*)    Abs Immature Granulocytes 0.08 (*)    All other components within normal limits  CBG MONITORING, ED - Abnormal; Notable for the following components:   Glucose-Capillary 113 (*)    All other components within normal limits  SARS CORONAVIRUS 2 BY RT PCR (HOSPITAL ORDER, [facility])  SURGICAL PCR SCREEN  HIV ANTIBODY (ROUTINE TESTING W REFLEX)  VITAMIN D 25 HYDROXY (VIT D DEFICIENCY, FRACTURES)    EKG None  Radiology DG Forearm Left  Result Date: 11/09/2019 CLINICAL DATA:  Fall, wrist pain. EXAM: LEFT FOREARM - 2 VIEW COMPARISON:  None. FINDINGS: Acute distal radial and ulnar fractures are better characterized on concurrent wrist radiographs. No additional fractures identified. The elbow is located. Soft tissues are unremarkable. IMPRESSION: Acute  distal radial and ulnar fractures are better characterized on concurrent wrist radiographs. No additional fractures identified. Electronically Signed   By: Margaretha Sheffield MD   On: 11/09/2019 13:21   DG Wrist Complete Left  Result Date: 11/09/2019 CLINICAL DATA:  Fall, wrist pain. EXAM: LEFT WRIST - COMPLETE 3+ VIEW COMPARISON:  None. FINDINGS: Acute impacted transverse fracture of the distal radial metaphysis with intra-articular extension and approximately 8 mm of dorsal displacement. Mild dorsal tilt. The distal fracture fragment articulates with the carpal bones. Acute minimally displaced fracture of the ulnar styloid. Soft tissue swelling about the wrist. Severe first CMC degenerative change. Moderate triscaphe degenerative change. IMPRESSION: 1. Acute impacted transverse distal radial metaphyseal fracture with intra-articular extension and dorsal displacement and tilt. 2. Minimally displaced ulnar styloid fracture. 3. Severe first CMC degenerative change. Electronically Signed   By: Margaretha Sheffield MD   On: 11/09/2019 13:26   DG Ankle Complete Right  Result Date: 11/09/2019 CLINICAL DATA:  Fall. EXAM: RIGHT ANKLE - COMPLETE 3+ VIEW COMPARISON:  None. FINDINGS: Nondisplaced fracture distal fibula. No fracture of the tibia. Ankle mortise intact. Small joint effusion. Mild degenerative change in the midfoot.  Mild calcaneal spurring. IMPRESSION: Nondisplaced fracture  distal fibula. Electronically Signed   By: Franchot Gallo M.D.   On: 11/09/2019 13:21   DG Foot Complete Right  Result Date: 11/09/2019 CLINICAL DATA:  Right foot pain laterally after fall EXAM: RIGHT FOOT COMPLETE - 3+ VIEW COMPARISON:  None. FINDINGS: Possible nondisplaced fracture of the fifth toe proximal phalanx diaphysis. Partially visualized nondisplaced distal right fibular metaphyseal fracture. Mild degenerative changes throughout the midfoot and at the first MTP joint. Diffuse soft tissue swelling predominantly at the ankle.  IMPRESSION: 1. Possible nondisplaced fracture of the fifth toe proximal phalanx. Correlate with point tenderness. 2. Partially visualized nondisplaced distal right fibular metaphyseal fracture. See dedicated right ankle views. Electronically Signed   By: Davina Poke D.O.   On: 11/09/2019 13:24    Procedures Procedures (including critical care time)  Medications Ordered in ED Medications  morphine 4 MG/ML injection 4 mg (4 mg Intravenous Given 11/09/19 1431)  sodium chloride 0.9 % bolus 500 mL (0 mLs Intravenous Stopped 11/09/19 1641)    Followed by  0.9 %  sodium chloride infusion (has no administration in time range)  HYDROmorphone (DILAUDID) injection 0.5 mg (has no administration in time range)  fentaNYL (SUBLIMAZE) injection 50 mcg (50 mcg Intravenous Given 11/09/19 1639)    ED Course  I have reviewed the triage vital signs and the nursing notes.  Pertinent labs & imaging results that were available during my care of the patient were reviewed by me and considered in my medical decision making (see chart for details).  Clinical Course as of Nov 09 1839  Wed Nov 09, 2019  1636 I was called the patient's room, she had just gotten her splint put on and was having 10 out of 10 pain and was hypotensive with pressures down into the 50K systolic, and presyncopal.    She was given IV fluids, fentanyl, 50 mg, and allowed to take some sips of water after which she improved, her blood pressure stabilized/normalized she felt better.     [EH]  1739 I spoke with hospitalist who will admit.    [EH]    Clinical Course User Index [EH] Ollen Gross   MDM Rules/Calculators/A&P                         Patient is a 64 year old woman who presents today for evaluation after mechanical, nonsyncopal fall when she slipped on wet concrete.  Here she denies striking her head or passing out, no pain in her head or her neck, and does not take any blood thinning medications.  She reports her only  injuries are her left wrist and her right ankle.  On exam her left wrist is obviously deformed however neurovascularly intact.  X-rays were obtained showing a intra-articular, displaced, and impacted distal radial fracture, ulnar styloid.  Splint is ordered.  X-rays on her right ankle show a nondisplaced distal right fibular metaphyseal fracture, along with a possible nondisplaced fracture of the proximal phalanx of the fifth toe.  Covid test is sent.  Screening labs were obtained.  Orion Crook from orthopedics came to see and evaluate the patient.  Request medicine admit, n.p.o. at midnight with plans for OR tomorrow.  I spoke with Hospitalist who will see the patient for admission.   Note: Portions of this report  may have been transcribed using voice recognition software. Every effort was made to ensure accuracy; however, inadvertent computerized transcription errors may be present   Final Clinical Impression(s) / ED Diagnoses Final diagnoses:  Other closed intra-articular fracture of distal end of left radius, initial encounter  Closed displaced fracture of styloid process of left ulna, initial encounter  Fall, initial encounter  Closed fracture of distal end of right fibula, unspecified fracture morphology, initial encounter    Rx / DC Orders ED Discharge Orders    None       Lorin Glass, PA-C 11/09/19 1850    Sherwood Gambler, MD 11/10/19 [phone]

## 2019-11-09 NOTE — ED Notes (Signed)
[facility] NOTIFIED THAT THE PTS 1800 MEDS WERE NOT VERIFIED AND  WE NEED THEM

## 2019-11-09 NOTE — ED Notes (Signed)
Patient transported to X-ray 

## 2019-11-09 NOTE — H&P (View-Only) (Signed)
Reason for Consult:Left wrist/right ankle fxs Referring Physician: [name] is an 64 y.o. female.  HPI: Monique Gonzalez was coming in from getting the paper and slipped on the wet concrete in her garage. She heard a crack and had immediate pain in her left wrist and right ankle. She could not get up and did not try to bear weight. She was brought to the ED where x-rays showed a distal radius and fibula fxs and orthopedic surgery was consulted. She is RHD and retired and lives with her 2 sisters.  Past Medical History:  Diagnosis Date  . Allergy   . Chronic pain    back and knee  . Colitis 09/2012   infectious vs inflammatory.   . Colon stricture ([place])   . Depression   . Hyperlipidemia   . Obesity   . Osteoarthritis   . Osteopenia   . Recurrent cold sores   . Seasonal allergies   . Syncope and collapse 11/30/2012   Normal EEG.  Vaso vagal syncope    Past Surgical History:  Procedure Laterality Date  . COLON RESECTION N/A 10/14/2013   Procedure: LAPAROSCOPIC MOBILIZATION OF SPLENIC FLEXURE, LAPAROSCOPIC EXTENDED LEFT COLECTOMY;  Surgeon: [name], MD;  Location: [place];  Service: General;  Laterality: N/A;  . COLONOSCOPY N/A 10/12/2013   Procedure: COLONOSCOPY;  Surgeon: [name], MD;  Location: [facility] ENDOSCOPY;  Service: Endoscopy;  Laterality: N/A;  . DILATION AND CURETTAGE, DIAGNOSTIC / THERAPEUTIC  1987  . OPEN REDUCTION INTERNAL FIXATION (ORIF) DISTAL RADIAL FRACTURE Right 08/08/2014   Procedure: OPEN REDUCTION INTERNAL FIXATION (ORIF) DISTAL RADIAL FRACTURE;  Surgeon: Roseanne Kaufman, MD;  Location: [place];  Service: Orthopedics;  Laterality: Right;  DVR Cross[place], MD available sometime around 1430-1500  . TONSILLECTOMY    . TOTAL KNEE ARTHROPLASTY Right 08/05/2016  . TOTAL KNEE ARTHROPLASTY Right 08/05/2016   Procedure: RIGHT TOTAL KNEE ARTHROPLASTY;  Surgeon: Mcarthur Rossetti, MD;  Location: [place];  Service: Orthopedics;  Laterality: Right;    Family History   Problem Relation Age of Onset  . Stroke Mother   . Arthritis Mother   . Thyroid disease Mother   . Cancer Father   . Arthritis Sister   . Thyroid disease Sister   . Cancer Maternal Grandmother   . Stroke Maternal Grandfather   . Cancer Maternal Grandfather   . Colon cancer Maternal Grandfather   . Cancer Paternal Grandmother   . Heart disease Paternal Grandmother   . Colon cancer Paternal Grandmother   . Stroke Paternal Grandfather   . Arthritis Sister   . Obesity Sister   . Sudden death Neg Hx   . Hypertension Neg Hx   . Hyperlipidemia Neg Hx   . Heart attack Neg Hx   . Diabetes Neg Hx   . Rectal cancer Neg Hx   . Stomach cancer Neg Hx   . Esophageal cancer Neg Hx     Social History:  reports that she quit smoking about 26 years ago. She has never used smokeless tobacco. She reports current alcohol use. She reports that she does not use drugs.  Allergies:  Allergies  Allergen Reactions  . Penicillins Rash and Other (See Comments)    Has patient had a PCN reaction causing immediate rash, facial/tongue/throat swelling, SOB or lightheadedness with hypotension: Unknown Has patient had a PCN reaction causing severe rash involving mucus membranes or skin necrosis: Unknown Has patient had a PCN reaction that required hospitalization: No Has patient  had a PCN reaction occurring within the last 10 years: No If all of the above answers are "NO", then may proceed with Cephalosporin use. Childhood reaction.  . Sulfa Antibiotics Rash    Medications: I have reviewed the patient's current medications.  No results found for this or any previous visit (from the past 48 hour(s)).  DG Forearm Left  Result Date: 11/09/2019 CLINICAL DATA:  Fall, wrist pain. EXAM: LEFT FOREARM - 2 VIEW COMPARISON:  None. FINDINGS: Acute distal radial and ulnar fractures are better characterized on concurrent wrist radiographs. No additional fractures identified. The elbow is located. Soft tissues are  unremarkable. IMPRESSION: Acute distal radial and ulnar fractures are better characterized on concurrent wrist radiographs. No additional fractures identified. Electronically Signed   By: Margaretha Sheffield MD   On: 11/09/2019 13:21   DG Wrist Complete Left  Result Date: 11/09/2019 CLINICAL DATA:  Fall, wrist pain. EXAM: LEFT WRIST - COMPLETE 3+ VIEW COMPARISON:  None. FINDINGS: Acute impacted transverse fracture of the distal radial metaphysis with intra-articular extension and approximately 8 mm of dorsal displacement. Mild dorsal tilt. The distal fracture fragment articulates with the carpal bones. Acute minimally displaced fracture of the ulnar styloid. Soft tissue swelling about the wrist. Severe first CMC degenerative change. Moderate triscaphe degenerative change. IMPRESSION: 1. Acute impacted transverse distal radial metaphyseal fracture with intra-articular extension and dorsal displacement and tilt. 2. Minimally displaced ulnar styloid fracture. 3. Severe first CMC degenerative change. Electronically Signed   By: Margaretha Sheffield MD   On: 11/09/2019 13:26   DG Ankle Complete Right  Result Date: 11/09/2019 CLINICAL DATA:  Fall. EXAM: RIGHT ANKLE - COMPLETE 3+ VIEW COMPARISON:  None. FINDINGS: Nondisplaced fracture distal fibula. No fracture of the tibia. Ankle mortise intact. Small joint effusion. Mild degenerative change in the midfoot.  Mild calcaneal spurring. IMPRESSION: Nondisplaced fracture  distal fibula. Electronically Signed   By: Franchot Gallo M.D.   On: 11/09/2019 13:21   DG Foot Complete Right  Result Date: 11/09/2019 CLINICAL DATA:  Right foot pain laterally after fall EXAM: RIGHT FOOT COMPLETE - 3+ VIEW COMPARISON:  None. FINDINGS: Possible nondisplaced fracture of the fifth toe proximal phalanx diaphysis. Partially visualized nondisplaced distal right fibular metaphyseal fracture. Mild degenerative changes throughout the midfoot and at the first MTP joint. Diffuse soft tissue  swelling predominantly at the ankle. IMPRESSION: 1. Possible nondisplaced fracture of the fifth toe proximal phalanx. Correlate with point tenderness. 2. Partially visualized nondisplaced distal right fibular metaphyseal fracture. See dedicated right ankle views. Electronically Signed   By: Davina Poke D.O.   On: 11/09/2019 13:24    Review of Systems  HENT: Negative for ear discharge, ear pain, hearing loss and tinnitus.   Eyes: Negative for photophobia and pain.  Respiratory: Negative for cough and shortness of breath.   Cardiovascular: Negative for chest pain.  Gastrointestinal: Negative for abdominal pain, nausea and vomiting.  Genitourinary: Negative for dysuria, flank pain, frequency and urgency.  Musculoskeletal: Positive for arthralgias (Left wrist, right ankle). Negative for back pain, myalgias and neck pain.  Neurological: Negative for dizziness and headaches.  Hematological: Does not bruise/bleed easily.  Psychiatric/Behavioral: The patient is not nervous/anxious.    Blood pressure (!) 131/114, pulse 73, resp. rate 16, SpO2 98 %. Physical Exam Constitutional:      General: She is not in acute distress.    Appearance: She is well-developed. She is not diaphoretic.  HENT:     Head: Normocephalic and atraumatic.  Eyes:  General: No scleral icterus.       Right eye: No discharge.        Left eye: No discharge.     Conjunctiva/sclera: Conjunctivae normal.  Cardiovascular:     Rate and Rhythm: Normal rate and regular rhythm.  Pulmonary:     Effort: Pulmonary effort is normal. No respiratory distress.  Musculoskeletal:     Cervical back: Normal range of motion.     Comments: Left shoulder, elbow, wrist, digits- no skin wounds, severe TTP, no instability, no blocks to motion  Sens  Ax/R/M/U intact, ?R/U mild paresthesia  Mot   Ax/ R/ PIN/ M/ AIN/ U intact  Rad 2+  LLE No traumatic wounds, ecchymosis, or rash  Mod TTP lateral ankle  No knee effusion  Knee stable to  varus/ valgus and anterior/posterior stress  Sens DPN, SPN, TN intact  Motor EHL 5/5  DP 2+, PT 0, No significant edema  Skin:    General: Skin is warm and dry.  Neurological:     Mental Status: She is alert.  Psychiatric:        Behavior: Behavior normal.     Assessment/Plan: Left wrist fx -- Will plan on ORIF tomorrow by either Dr. Doreatha Martin or [facility] [facility]. NPO after MN. Sugar tong splint for now. Right ankle fx -- Will plan stress fluoroscopy tomorrow with possible ORIF if unstable. Boot, NWB for now. Multiple medical problems including HLD, depression, and GERD -- Home meds    Lisette Abu, PA-C Orthopedic Surgery [phone] 11/09/2019, 2:20 PM

## 2019-11-10 ENCOUNTER — Inpatient Hospital Stay (HOSPITAL_COMMUNITY): Payer: Medicare Other | Admitting: Anesthesiology

## 2019-11-10 ENCOUNTER — Inpatient Hospital Stay (HOSPITAL_COMMUNITY): Payer: Medicare Other

## 2019-11-10 ENCOUNTER — Encounter (HOSPITAL_COMMUNITY)
Admission: EM | Disposition: A | Discharge status: Home Health | Payer: Self-pay | Source: Home / Self Care | Attending: Internal Medicine

## 2019-11-10 ENCOUNTER — Encounter (HOSPITAL_COMMUNITY): Payer: Self-pay | Admitting: Internal Medicine

## 2019-11-10 DIAGNOSIS — W19XXXA Unspecified fall, initial encounter: Secondary | ICD-10-CM

## 2019-11-10 DIAGNOSIS — E669 Obesity, unspecified: Secondary | ICD-10-CM | POA: Diagnosis not present

## 2019-11-10 DIAGNOSIS — S52592A Other fractures of lower end of left radius, initial encounter for closed fracture: Secondary | ICD-10-CM | POA: Diagnosis not present

## 2019-11-10 DIAGNOSIS — S82891A Other fracture of right lower leg, initial encounter for closed fracture: Secondary | ICD-10-CM | POA: Diagnosis not present

## 2019-11-10 DIAGNOSIS — S52502A Unspecified fracture of the lower end of left radius, initial encounter for closed fracture: Secondary | ICD-10-CM

## 2019-11-10 HISTORY — DX: Unspecified fracture of the lower end of left radius, initial encounter for closed fracture: S52.502A

## 2019-11-10 HISTORY — PX: ORIF ANKLE FRACTURE: SHX5408

## 2019-11-10 HISTORY — DX: Unspecified fall, initial encounter: W19.XXXA

## 2019-11-10 HISTORY — PX: ORIF WRIST FRACTURE: SHX2133

## 2019-11-10 LAB — HIV ANTIBODY (ROUTINE TESTING W REFLEX): HIV Screen 4th Generation wRfx: NONREACTIVE

## 2019-11-10 LAB — VITAMIN D 25 HYDROXY (VIT D DEFICIENCY, FRACTURES): Vit D, 25-Hydroxy: 35.63 ng/mL (ref 30–100)

## 2019-11-10 LAB — SURGICAL PCR SCREEN
MRSA, PCR: NEGATIVE
Staphylococcus aureus: NEGATIVE

## 2019-11-10 SURGERY — OPEN REDUCTION INTERNAL FIXATION (ORIF) WRIST FRACTURE
Anesthesia: Regional | Site: Wrist | Laterality: Right

## 2019-11-10 MED ORDER — VANCOMYCIN HCL 1000 MG IV SOLR
INTRAVENOUS | Status: AC
  Filled 2019-11-10: qty 1000

## 2019-11-10 MED ORDER — CHLORHEXIDINE GLUCONATE 0.12 % MT SOLN
OROMUCOSAL | Status: AC
  Administered 2019-11-10: 15 mL via OROMUCOSAL
  Filled 2019-11-10: qty 15

## 2019-11-10 MED ORDER — SUCCINYLCHOLINE CHLORIDE 20 MG/ML IJ SOLN
INTRAMUSCULAR | Status: DC | PRN
  Administered 2019-11-10: 140 mg via INTRAVENOUS

## 2019-11-10 MED ORDER — LIDOCAINE 2% (20 MG/ML) 5 ML SYRINGE
INTRAMUSCULAR | Status: AC
  Filled 2019-11-10: qty 5

## 2019-11-10 MED ORDER — ATORVASTATIN CALCIUM 40 MG PO TABS
40.0000 mg | ORAL_TABLET | Freq: Every day | ORAL | Status: DC
  Administered 2019-11-11: 40 mg via ORAL
  Filled 2019-11-10: qty 1

## 2019-11-10 MED ORDER — ENOXAPARIN SODIUM 40 MG/0.4ML ~~LOC~~ SOLN
40.0000 mg | SUBCUTANEOUS | Status: DC
  Administered 2019-11-11 – 2019-11-12 (×2): 40 mg via SUBCUTANEOUS
  Filled 2019-11-10 (×2): qty 0.4

## 2019-11-10 MED ORDER — LACTATED RINGERS IV SOLN: INTRAVENOUS | Status: DC

## 2019-11-10 MED ORDER — KETOROLAC TROMETHAMINE 30 MG/ML IJ SOLN
INTRAMUSCULAR | Status: AC
  Filled 2019-11-10: qty 1

## 2019-11-10 MED ORDER — 0.9 % SODIUM CHLORIDE (POUR BTL) OPTIME
TOPICAL | Status: DC | PRN
  Administered 2019-11-10: 1000 mL

## 2019-11-10 MED ORDER — DEXAMETHASONE SODIUM PHOSPHATE 10 MG/ML IJ SOLN
INTRAMUSCULAR | Status: DC | PRN
  Administered 2019-11-10: 5 mg

## 2019-11-10 MED ORDER — PHENYLEPHRINE 40 MCG/ML (10ML) SYRINGE FOR IV PUSH (FOR BLOOD PRESSURE SUPPORT)
PREFILLED_SYRINGE | INTRAVENOUS | Status: DC | PRN
  Administered 2019-11-10 (×5): 80 ug via INTRAVENOUS

## 2019-11-10 MED ORDER — PHENYLEPHRINE HCL-NACL 10-0.9 MG/250ML-% IV SOLN
INTRAVENOUS | Status: DC | PRN
  Administered 2019-11-10: 50 ug/min via INTRAVENOUS

## 2019-11-10 MED ORDER — SUCCINYLCHOLINE CHLORIDE 20 MG/ML IJ SOLN: INTRAMUSCULAR | Status: DC | PRN

## 2019-11-10 MED ORDER — VANCOMYCIN HCL 1500 MG/300ML IV SOLN
1500.0000 mg | INTRAVENOUS | Status: AC
  Administered 2019-11-10: 1500 mg via INTRAVENOUS
  Filled 2019-11-10: qty 300

## 2019-11-10 MED ORDER — ACETAMINOPHEN 500 MG PO TABS
1000.0000 mg | ORAL_TABLET | Freq: Once | ORAL | Status: AC
  Administered 2019-11-10: 1000 mg via ORAL
  Filled 2019-11-10: qty 2

## 2019-11-10 MED ORDER — LACTATED RINGERS IV SOLN: INTRAVENOUS | Status: DC | PRN

## 2019-11-10 MED ORDER — HYDROMORPHONE HCL 1 MG/ML IJ SOLN
INTRAMUSCULAR | Status: AC
  Filled 2019-11-10: qty 1

## 2019-11-10 MED ORDER — DEXAMETHASONE SODIUM PHOSPHATE 10 MG/ML IJ SOLN
INTRAMUSCULAR | Status: DC | PRN
  Administered 2019-11-10: 10 mg via INTRAVENOUS

## 2019-11-10 MED ORDER — ONDANSETRON HCL 4 MG/2ML IJ SOLN
INTRAMUSCULAR | Status: DC | PRN
  Administered 2019-11-10: 4 mg via INTRAVENOUS

## 2019-11-10 MED ORDER — FENTANYL CITRATE (PF) 250 MCG/5ML IJ SOLN
INTRAMUSCULAR | Status: AC
  Filled 2019-11-10: qty 5

## 2019-11-10 MED ORDER — MIDAZOLAM HCL 2 MG/2ML IJ SOLN
INTRAMUSCULAR | Status: AC
  Filled 2019-11-10: qty 2

## 2019-11-10 MED ORDER — LIDOCAINE HCL (CARDIAC) PF 100 MG/5ML IV SOSY
PREFILLED_SYRINGE | INTRAVENOUS | Status: DC | PRN
  Administered 2019-11-10: 40 mg via INTRATRACHEAL

## 2019-11-10 MED ORDER — FENTANYL CITRATE (PF) 100 MCG/2ML IJ SOLN
INTRAMUSCULAR | Status: AC
  Administered 2019-11-10: 50 ug via INTRAVENOUS
  Filled 2019-11-10: qty 2

## 2019-11-10 MED ORDER — CHLORHEXIDINE GLUCONATE 4 % EX LIQD
60.0000 mL | Freq: Once | CUTANEOUS | Status: DC
  Filled 2019-11-10: qty 15

## 2019-11-10 MED ORDER — PROPOFOL 10 MG/ML IV BOLUS
INTRAVENOUS | Status: AC
  Filled 2019-11-10: qty 20

## 2019-11-10 MED ORDER — MIDAZOLAM HCL 2 MG/2ML IJ SOLN
INTRAMUSCULAR | Status: AC
  Administered 2019-11-10: 1 mg via INTRAVENOUS
  Filled 2019-11-10: qty 2

## 2019-11-10 MED ORDER — ROPIVACAINE HCL 5 MG/ML IJ SOLN
INTRAMUSCULAR | Status: DC | PRN
  Administered 2019-11-10: 20 mL via PERINEURAL

## 2019-11-10 MED ORDER — KETOROLAC TROMETHAMINE 30 MG/ML IJ SOLN
30.0000 mg | Freq: Once | INTRAMUSCULAR | Status: AC | PRN
  Administered 2019-11-10: 30 mg via INTRAVENOUS

## 2019-11-10 MED ORDER — ENSURE PRE-SURGERY PO LIQD
296.0000 mL | Freq: Once | ORAL | Status: DC
  Filled 2019-11-10: qty 296

## 2019-11-10 MED ORDER — FENTANYL CITRATE (PF) 100 MCG/2ML IJ SOLN: 50.0000 ug | Freq: Once | INTRAMUSCULAR | Status: AC

## 2019-11-10 MED ORDER — LORATADINE 10 MG PO TABS
10.0000 mg | ORAL_TABLET | Freq: Every day | ORAL | Status: DC
  Administered 2019-11-11: 10 mg via ORAL
  Filled 2019-11-10: qty 1

## 2019-11-10 MED ORDER — OXYCODONE HCL 5 MG/5ML PO SOLN: 5.0000 mg | Freq: Once | ORAL | Status: DC | PRN

## 2019-11-10 MED ORDER — POVIDONE-IODINE 10 % EX SWAB: 2.0000 "application " | Freq: Once | CUTANEOUS | Status: DC

## 2019-11-10 MED ORDER — MIDAZOLAM HCL 2 MG/2ML IJ SOLN: 1.0000 mg | Freq: Once | INTRAMUSCULAR | Status: AC

## 2019-11-10 MED ORDER — CLINDAMYCIN PHOSPHATE 900 MG/50ML IV SOLN
900.0000 mg | Freq: Three times a day (TID) | INTRAVENOUS | Status: AC
  Administered 2019-11-10 – 2019-11-11 (×3): 900 mg via INTRAVENOUS
  Filled 2019-11-10 (×3): qty 50

## 2019-11-10 MED ORDER — VANCOMYCIN HCL 1000 MG IV SOLR
INTRAVENOUS | Status: DC | PRN
  Administered 2019-11-10: 1000 mg

## 2019-11-10 MED ORDER — PROMETHAZINE HCL 25 MG/ML IJ SOLN: 6.2500 mg | INTRAMUSCULAR | Status: DC | PRN

## 2019-11-10 MED ORDER — HYDROMORPHONE HCL 1 MG/ML IJ SOLN
0.2500 mg | INTRAMUSCULAR | Status: DC | PRN
  Administered 2019-11-10: 0.25 mg via INTRAVENOUS

## 2019-11-10 MED ORDER — BUPIVACAINE HCL (PF) 0.25 % IJ SOLN
INTRAMUSCULAR | Status: AC
  Filled 2019-11-10: qty 30

## 2019-11-10 MED ORDER — ACETAMINOPHEN 325 MG PO TABS
650.0000 mg | ORAL_TABLET | Freq: Four times a day (QID) | ORAL | Status: DC | PRN
  Administered 2019-11-10 (×2): 650 mg via ORAL
  Filled 2019-11-10 (×2): qty 2

## 2019-11-10 MED ORDER — EPHEDRINE SULFATE-NACL 50-0.9 MG/10ML-% IV SOSY
PREFILLED_SYRINGE | INTRAVENOUS | Status: DC | PRN
  Administered 2019-11-10: 10 mg via INTRAVENOUS

## 2019-11-10 MED ORDER — PROPOFOL 10 MG/ML IV BOLUS
INTRAVENOUS | Status: DC | PRN
  Administered 2019-11-10: 50 mg via INTRAVENOUS
  Administered 2019-11-10: 150 mg via INTRAVENOUS

## 2019-11-10 MED ORDER — OXYCODONE HCL 5 MG PO TABS: 5.0000 mg | ORAL_TABLET | Freq: Once | ORAL | Status: DC | PRN

## 2019-11-10 MED ORDER — DEXAMETHASONE SODIUM PHOSPHATE 10 MG/ML IJ SOLN
INTRAMUSCULAR | Status: AC
  Filled 2019-11-10: qty 1

## 2019-11-10 MED ORDER — CHLORHEXIDINE GLUCONATE 0.12 % MT SOLN: 15.0000 mL | Freq: Once | OROMUCOSAL | Status: AC

## 2019-11-10 MED ORDER — ONDANSETRON HCL 4 MG/2ML IJ SOLN
INTRAMUSCULAR | Status: AC
  Filled 2019-11-10: qty 2

## 2019-11-10 MED ORDER — FENTANYL CITRATE (PF) 250 MCG/5ML IJ SOLN
INTRAMUSCULAR | Status: DC | PRN
  Administered 2019-11-10: 100 ug via INTRAVENOUS

## 2019-11-10 SURGICAL SUPPLY — 89 items
BANDAGE ESMARK 6X9 LF (GAUZE/BANDAGES/DRESSINGS) ×2 IMPLANT
BIT DRILL 2.2 SS TIBIAL (BIT) ×1 IMPLANT
BLADE CLIPPER SURG (BLADE) IMPLANT
BNDG COHESIVE 4X5 TAN STRL (GAUZE/BANDAGES/DRESSINGS) ×3 IMPLANT
BNDG ELASTIC 3X5.8 VLCR STR LF (GAUZE/BANDAGES/DRESSINGS) ×3 IMPLANT
BNDG ELASTIC 4X5.8 VLCR STR LF (GAUZE/BANDAGES/DRESSINGS) ×5 IMPLANT
BNDG ELASTIC 6X5.8 VLCR STR LF (GAUZE/BANDAGES/DRESSINGS) IMPLANT
BNDG ESMARK 4X9 LF (GAUZE/BANDAGES/DRESSINGS) ×3 IMPLANT
BNDG ESMARK 6X9 LF (GAUZE/BANDAGES/DRESSINGS) ×3
BNDG GAUZE ELAST 4 BULKY (GAUZE/BANDAGES/DRESSINGS) ×3 IMPLANT
BRUSH SCRUB EZ PLAIN DRY (MISCELLANEOUS) ×6 IMPLANT
CHLORAPREP W/TINT 26 (MISCELLANEOUS) ×3 IMPLANT
COVER SURGICAL LIGHT HANDLE (MISCELLANEOUS) ×3 IMPLANT
COVER WAND RF STERILE (DRAPES) ×3 IMPLANT
CUFF TOURN SGL QUICK 18X4 (TOURNIQUET CUFF) IMPLANT
CUFF TOURN SGL QUICK 24 (TOURNIQUET CUFF)
CUFF TRNQT CYL 24X4X16.5-23 (TOURNIQUET CUFF) IMPLANT
DECANTER SPIKE VIAL GLASS SM (MISCELLANEOUS) ×3 IMPLANT
DRAPE C-ARM 35X43 STRL (DRAPES) IMPLANT
DRAPE C-ARM 42X72 X-RAY (DRAPES) ×3 IMPLANT
DRAPE C-ARMOR (DRAPES) ×3 IMPLANT
DRAPE ORTHO SPLIT 77X108 STRL (DRAPES) ×2
DRAPE SURG ORHT 6 SPLT 77X108 (DRAPES) ×4 IMPLANT
DRAPE U-SHAPE 47X51 STRL (DRAPES) ×3 IMPLANT
DRSG ADAPTIC 3X8 NADH LF (GAUZE/BANDAGES/DRESSINGS) IMPLANT
ELECT REM PT RETURN 9FT ADLT (ELECTROSURGICAL) ×3
ELECTRODE REM PT RTRN 9FT ADLT (ELECTROSURGICAL) ×2 IMPLANT
GAUZE SPONGE 4X4 12PLY STRL (GAUZE/BANDAGES/DRESSINGS) ×3 IMPLANT
GAUZE XEROFORM 1X8 LF (GAUZE/BANDAGES/DRESSINGS) ×3 IMPLANT
GLOVE BIO SURGEON STRL SZ 6.5 (GLOVE) ×9 IMPLANT
GLOVE BIO SURGEON STRL SZ7.5 (GLOVE) ×9 IMPLANT
GLOVE BIOGEL PI IND STRL 6.5 (GLOVE) ×2 IMPLANT
GLOVE BIOGEL PI IND STRL 7.0 (GLOVE) ×2 IMPLANT
GLOVE BIOGEL PI IND STRL 7.5 (GLOVE) ×2 IMPLANT
GLOVE BIOGEL PI INDICATOR 6.5 (GLOVE) ×1
GLOVE BIOGEL PI INDICATOR 7.0 (GLOVE) ×1
GLOVE BIOGEL PI INDICATOR 7.5 (GLOVE) ×1
GOWN STRL REUS W/ TWL LRG LVL3 (GOWN DISPOSABLE) ×4 IMPLANT
GOWN STRL REUS W/TWL LRG LVL3 (GOWN DISPOSABLE) ×2
K-WIRE 1.6 (WIRE) ×3
K-WIRE FX5X1.6XNS BN SS (WIRE) ×6
KIT BASIN OR (CUSTOM PROCEDURE TRAY) ×3 IMPLANT
KIT TURNOVER KIT B (KITS) ×3 IMPLANT
KWIRE FX5X1.6XNS BN SS (WIRE) IMPLANT
MANIFOLD NEPTUNE II (INSTRUMENTS) ×3 IMPLANT
NDL HYPO 21X1.5 SAFETY (NEEDLE) IMPLANT
NDL HYPO 25GX1X1/2 BEV (NEEDLE) ×2 IMPLANT
NEEDLE 22X1 1/2 (OR ONLY) (NEEDLE) IMPLANT
NEEDLE HYPO 21X1.5 SAFETY (NEEDLE) IMPLANT
NEEDLE HYPO 25GX1X1/2 BEV (NEEDLE) ×3 IMPLANT
NS IRRIG 1000ML POUR BTL (IV SOLUTION) ×3 IMPLANT
PACK ORTHO EXTREMITY (CUSTOM PROCEDURE TRAY) ×3 IMPLANT
PACK TOTAL JOINT (CUSTOM PROCEDURE TRAY) ×3 IMPLANT
PAD ARMBOARD 7.5X6 YLW CONV (MISCELLANEOUS) ×6 IMPLANT
PAD CAST 4YDX4 CTTN HI CHSV (CAST SUPPLIES) ×2 IMPLANT
PADDING CAST COTTON 4X4 STRL (CAST SUPPLIES) ×3
PADDING CAST COTTON 6X4 STRL (CAST SUPPLIES) IMPLANT
PEG LOCKING SMOOTH 2.2X16 (Screw) ×1 IMPLANT
PEG LOCKING SMOOTH 2.2X22 (Screw) ×3 IMPLANT
PEG LOCKING SMOOTH 2.2X24 (Peg) ×2 IMPLANT
PLATE STANDARD DVR LEFT (Plate) ×3 IMPLANT
PLATE STD DVR LT 24X51 (Plate) IMPLANT
SCREW  LP NL 2.7X16MM (Screw) ×2 IMPLANT
SCREW 2.7X14MM (Screw) ×1 IMPLANT
SCREW BN 14X2.7XNONLOCK 3 LD (Screw) IMPLANT
SCREW LP NL 2.7X16MM (Screw) IMPLANT
SCREW MULTI DIRECTIONAL 2.7X22 (Screw) ×1 IMPLANT
SCREW MULTI DIRECTIONAL 2.7X24 (Screw) ×1 IMPLANT
SCREW NLOCK 24X2.7 3 LD (Screw) IMPLANT
SCREW NONLOCK 2.7X24 (Screw) ×1 IMPLANT
SPONGE LAP 18X18 RF (DISPOSABLE) IMPLANT
STAPLER VISISTAT 35W (STAPLE) ×3 IMPLANT
SUCTION FRAZIER HANDLE 10FR (MISCELLANEOUS) ×1
SUCTION TUBE FRAZIER 10FR DISP (MISCELLANEOUS) ×2 IMPLANT
SUT ETHILON 3 0 PS 1 (SUTURE) ×6 IMPLANT
SUT MON AB 3-0 SH 27 (SUTURE) ×1
SUT MON AB 3-0 SH27 (SUTURE) IMPLANT
SUT PROLENE 0 CT (SUTURE) IMPLANT
SUT VIC AB 0 CT1 27 (SUTURE) ×1
SUT VIC AB 0 CT1 27XBRD ANBCTR (SUTURE) ×2 IMPLANT
SUT VIC AB 2-0 CT1 27 (SUTURE) ×1
SUT VIC AB 2-0 CT1 TAPERPNT 27 (SUTURE) ×4 IMPLANT
SYR CONTROL 10ML LL (SYRINGE) ×3 IMPLANT
TOWEL GREEN STERILE (TOWEL DISPOSABLE) ×6 IMPLANT
TOWEL GREEN STERILE FF (TOWEL DISPOSABLE) ×3 IMPLANT
TUBE CONNECTING 12X1/4 (SUCTIONS) ×3 IMPLANT
UNDERPAD 30X36 HEAVY ABSORB (UNDERPADS AND DIAPERS) ×3 IMPLANT
WATER STERILE IRR 1000ML POUR (IV SOLUTION) ×3 IMPLANT
YANKAUER SUCT BULB TIP NO VENT (SUCTIONS) IMPLANT

## 2019-11-10 NOTE — Op Note (Signed)
Orthopaedic Surgery Operative Note (CSN: [id number] ) Date of Surgery: 11/10/2019  Admit Date: 11/09/2019   Diagnoses: Pre-Op Diagnoses: Left extra-articular distal radius fracture Right lateral malleolus fracture   Post-Op Diagnosis: Same  Procedures: 1. CPT 25607-Open reduction internal fixation of left distal radius 2. CPT 27788-Stress examination of left ankle with closed treatment  Surgeons : Primary: Easter Schinke, Thomasene Lot, MD  Assistant: Patrecia Pace, PA-C  Location: [facility]   Anesthesia:General  Antibiotics: Vancomycin 1500mg  IV preop with 1 gm vancomycin powder placed topically in incision   Tourniquet time: Total Tourniquet Time Documented: Upper Arm (Left) - 52 minutes Total: Upper Arm (Left) - 52 minutes  Estimated Blood XMIW:80 mL  Complications:None  Specimens:None   Implants: Implant Name Type Inv. Item Serial No. Manufacturer Lot No. LRB No. Used Action  PLATE STANDARD DVR LEFT - [id number] Plate PLATE STANDARD DVR LEFT  [employer](ORTH,TRAU,BIO,SG)  Left 1 Implanted  PEG LOCKING SMOOTH 2.2X22 - [id number] Screw PEG LOCKING SMOOTH 2.2X22  [employer](ORTH,TRAU,BIO,SG)  Left 3 Implanted  PEG LOCKING SMOOTH 2.2X16 - [id number] Screw PEG LOCKING SMOOTH 2.2X16  [employer](ORTH,TRAU,BIO,SG)  Left 1 Implanted  SCREW MULTI DIRECTIONAL 2.7X22 - [id number] Screw SCREW MULTI DIRECTIONAL 2.7X22  [employer](ORTH,TRAU,BIO,SG)  Left 1 Implanted  SCREW MULTI DIRECTIONAL 2.7X24 - [id number] Screw SCREW MULTI DIRECTIONAL 2.7X24  [employer](ORTH,TRAU,BIO,SG)  Left 1 Implanted  SCREW 2.7X14MM - [id number] Screw SCREW 2.7X14MM  [employer](ORTH,TRAU,BIO,SG)  Left 1 Implanted  SCREW  LP NL 2.7X16MM - [id number] Screw SCREW  LP NL 2.7X16MM  [employer](ORTH,TRAU,BIO,SG)  Left 2 Implanted  SCREW NONLOCK 2.7X24 - [id number] Screw SCREW NONLOCK 2.7X24  [employer](ORTH,TRAU,BIO,SG)  Left 1 Implanted     Indications for Surgery: 64 year old who sustained a ground-level fall and had a  left extra-articular distal radius fracture and a right lateral malleolus fracture.  I felt that she was indicated for open reduction internal fixation of her left distal radius due to the amount of displacement.  I felt that the ankle required a stress examination with possible open reduction internal fixation.  I discussed risks and benefits with the patient.  Risks include but not limited to bleeding, infection, malunion, nonunion, hardware failure, hardware irritation, nerve or blood vessel injury, DVT, even the possibility anesthetic complications.  Patient agreed to proceed with surgery and consent was obtained.  Operative Findings: 1.  Stress examination of right ankle showed no medial clear space widening and no signs that there is any syndesmotic disruption. 2.  Open reduction internal fixation of left extra-articular distal radius fracture using [employer] DVR distal radius plate.  Procedure: The patient was identified in the preoperative holding area. Consent was confirmed with the patient and their family and all questions were answered. The operative extremity was marked after confirmation with the patient. she was then brought back to the operating room by our anesthesia colleagues.  She was placed under general anesthetic and carefully transferred over to a radiolucent flat top table.  Refer started out by stressing at the right ankle.  Using fluoroscopy I dorsiflex the ankle and performed an external rotation stress view.  There is no apparent medial clear space widening.  I felt that her lateral malleolus was a stable and an SER 2 pattern.  As result I felt that this can be treated nonoperatively.  We then turned our attention to the left upper extremity.  A nonsterile tourniquet was placed.  The left upper extremity was prepped and draped in usual sterile fashion.  A timeout  was performed to verify the patient, the procedure, and the extremity.  Preoperative antibiotics were  dosed.  Fluoroscopic imaging was obtained to show the unstable nature of her injury.  An Esmarch was used to exsanguinate the extremity and the tourniquet was inflated to 250 mmHg.  Total tourniquet time as noted above.  A standard volar approach to the distal radius was carried down through skin and subcutaneous tissue.  The fascia overlying the FCR was incised and the FCR was mobilized and the dorsal sheath was incised as well.  The finger flexors were swept out of the way.  I released the pronator quadratus off of the radial border of the radius and visualize the fracture.  I then performed a reduction maneuver obtaining anatomic reduction and held provisionally with a K wire from the radial styloid placed percutaneously into the ulnar cortex of the proximal radius.  Once I had provisional fixation I then proceeded to place a volar distal radius plate from the [place] DVR set.  I placed K wires in the distal and proximal segment and confirmed positioning with fluoroscopy.  I then placed a nonlocking screw into the distal segment to bring the plate flush to bone.  I then placed locking screws in the distal segment and confirmed they were extra-articular on fluoroscopy.  Once I had the distal fixation I then placed nonlocking screws into the radial shaft to bring the plate flush to bone and correct the volar tilt.  The previous K wire that was used provisional reduction was removed.  Final fluoroscopic imaging was obtained.  The incision was copiously irrigated.  A gram of vancomycin powder was placed into the incision.  A layer closure of 2-0 Vicryl 3-0 Monocryl and Steri-Strips was placed.  A sterile dressing consisting of 4 x 4's, sterile cast padding and a volar resting wrist splint was placed.  The patient was awoken from anesthesia and taken to the PACU in stable condition.  Post Op Plan/Instructions: Patient will be weightbearing as tolerated right lower extremity in her boot.  She will be  weightbearing as tolerated to left upper extremity through her elbow.  She will be nonweightbearing through her wrist.  We will have her mobilize with physical and Occupational Therapy.  We will have her on Lovenox for DVT prophylaxis while she is an inpatient.  Depending on her mobility we may be able to discharge her on aspirin.  She will receive postoperative antibiotics for surgical prophylaxis.  I was present and performed the entire surgery.  Patrecia Pace, PA-C did assist me throughout the case. An assistant was necessary given the difficulty in approach, maintenance of reduction and ability to instrument the fracture.   Katha Hamming, MD [facility]

## 2019-11-10 NOTE — Anesthesia Procedure Notes (Signed)
Anesthesia Regional Block: Supraclavicular block   Pre-Anesthetic Checklist: ,, timeout performed, Correct Patient, Correct Site, Correct Laterality, Correct Procedure, Correct Position, site marked, Risks and benefits discussed,  Surgical consent,  Pre-op evaluation,  At surgeon's request and post-op pain management  Laterality: Left  Prep: Maximum Sterile Barrier Precautions used, chloraprep       Needles:  Injection technique: Single-shot  Needle Type: Echogenic Stimulator Needle     Needle Length: 9cm  Needle Gauge: 22     Additional Needles:   Procedures:,,,, ultrasound used (permanent image in chart),,,,  Narrative:  Start time: 11/10/2019 9:30 AM End time: 11/10/2019 9:37 AM Injection made incrementally with aspirations every 5 mL.  Performed by: Personally  Anesthesiologist: Pervis Hocking, DO  Additional Notes: Monitors applied. No increased pain on injection. No increased resistance to injection. Injection made in 5cc increments. Good needle visualization. Patient tolerated procedure well.

## 2019-11-10 NOTE — Transfer of Care (Signed)
Immediate Anesthesia Transfer of Care Note  Patient: [name]  Procedure(s) Performed: OPEN REDUCTION INTERNAL FIXATION (ORIF) WRIST FRACTURE (Left Wrist) POSSIBLEOPEN REDUCTION INTERNAL FIXATION (ORIF) ANKLE FRACTURE (Right Ankle)  Patient Location: PACU  Anesthesia Type:General  Level of Consciousness: awake, oriented and patient cooperative  Airway & Oxygen Therapy: Patient Spontanous Breathing and Patient connected to nasal cannula oxygen  Post-op Assessment: Report given to RN and Post -op Vital signs reviewed and stable  Post vital signs: Reviewed  Last Vitals:  Vitals Value Taken Time  BP 150/92 11/10/19 1159  Temp    Pulse 94 11/10/19 1203  Resp 12 11/10/19 1203  SpO2 97 % 11/10/19 1203  Vitals shown include unvalidated device data.  Last Pain:  Vitals:   11/10/19 0411  TempSrc: Oral  PainSc:          Complications: No complications documented.

## 2019-11-10 NOTE — ED Notes (Signed)
Report given to rn on 5n 

## 2019-11-10 NOTE — Progress Notes (Signed)
PT Cancellation Note  Patient Details Name: GRETCHEN WEINFELD MRN: [id number] DOB: 10-Oct-1955   Cancelled Treatment:    Reason Eval/Treat Not Completed: Active bedrest order (pending further tests and procedures as well).    Wyona Almas, PT, DPT Acute Rehabilitation Services Pager [phone] Office [phone]    Deno Etienne 11/10/2019, 7:44 AM

## 2019-11-10 NOTE — Progress Notes (Signed)
[name], female   DOB: 1955-10-17, 64 y.o.   MRN: [id number]  PROGRESS NOTE    MILIA WARTH  ACZ:[id number] DOB: 1955-10-05 DOA: 11/09/2019 PCP: Susy Frizzle, MD    Brief Narrative:  Monique Gonzalez is a 64 y.o. female with medical history significant of HLD, morbid obesity, depression/anxiety, presented with left wrist pain and deformity and right ankle pain and deformity after mechanical fall.  Patient was bending down to reach her morning paper on the floor outside garage and fell onto wet concrete, immediately hard to crack on her left wrist.  Unable to get up because severe right ankle pain and left wrist pain called 911.  Denies loss of consciousness, no prodromes of lightheadedness evaluation before this happened, she attributed her fall to wet and slippery concrete.  Denied any direct impact of her body, neck or head. X-rays showed acute impacted transverse distal radius metaphyseal fracture, with displacement and tilt, minimum displaced ulnar styloid fracture; distal fibular fracture non-displaced   Assessment & Plan:   Active Problems:   Hyperlipidemia   Class 2 obesity without serious comorbidity with body mass index (BMI) of 37.0 to 37.9 in adult   Vitamin D deficiency   Closed right ankle fracture   Ankle fracture   Closed fracture of left distal radius   Fall   Left wrist/distal radial and ulnar fracture -Secondary to mechanical fall, s/p ORIF -DVT prophylaxis  Right ankle fracture -Also plan to try closed reduction under fluoroscopy, possible ORIF if fails -PT afterwards  HLD -Statin  Anxiety depression -SSRI  GERD -PPI  Vitamin D deficiency -level 35    DVT prophylaxis: Lovenox SQ Code Status: Full code  Family Communication: patient at bedside Disposition Plan: home with PT   Consultants:   Orthopedics  Procedures:  ORIF left wrist  Antimicrobials: Anti-infectives (From admission, onward)   Start     Dose/Rate Route  Frequency Ordered Stop   11/10/19 1700  clindamycin (CLEOCIN) IVPB 900 mg       Note to Pharmacy: Please give 8 hours from intraoperative dose   900 mg 100 mL/hr over 30 Minutes Intravenous Every 8 hours 11/10/19 1257 11/11/19 1659   11/10/19 1101  vancomycin (VANCOCIN) powder  Status:  Discontinued          As needed 11/10/19 1102 11/10/19 1152   11/10/19 0800  vancomycin (VANCOREADY) IVPB 1500 mg/300 mL        1,500 mg 150 mL/hr over 120 Minutes Intravenous To Short Stay 11/10/19 0735 11/10/19 1117       Subjective: Arm is numb, so pain is well controlled. Having dinner. Will begin therapies in the am.  Objective: Vitals:   11/10/19 1215 11/10/19 1230 11/10/19 1238 11/10/19 1252  BP: (!) 161/92 (!) 172/99 (!) 163/94 (!) 158/91  Pulse: 96 100 97 94  Resp: 15 12 12 16   Temp:   97.7 F (36.5 C) 98.5 F (36.9 C)  TempSrc:    Oral  SpO2: 91% 95% 96% 97%  Weight:      Height:        Intake/Output Summary (Last 24 hours) at 11/10/2019 1640 Last data filed at 11/10/2019 1345 Gross per 24 hour  Intake 1020 ml  Output 25 ml  Net 995 ml   Filed Weights   11/09/19 1632 11/10/19 0856  Weight: 132.9 kg 132.9 kg    Examination:  General exam: Appears calm and comfortable  Respiratory system: Clear to auscultation. Respiratory effort normal.  Cardiovascular system: S1 & S2 heard, RRR.  Gastrointestinal system: Abdomen is nondistended, soft and nontender.  Central nervous system: Alert and oriented. No focal neurological deficits. Extremities: right foot in boot, ace wrap on left arm Skin: No rashes Psychiatry: Judgement and insight appear normal. Mood & affect appropriate.     Data Reviewed: I have personally reviewed following labs and imaging studies  CBC: Recent Labs  Lab 11/09/19 1622  WBC 6.6  NEUTROABS 5.1  HGB 11.3*  HCT 35.8*  MCV 89.3  PLT 038   Basic Metabolic Panel: Recent Labs  Lab 11/09/19 1622  NA 140  K 4.2  CL 106  CO2 25  GLUCOSE 113*  BUN  13  CREATININE 0.89  CALCIUM 9.0   GFR: Estimated Creatinine Clearance: 92.6 mL/min (by C-G formula based on SCr of 0.89 mg/dL). Liver Function Tests: Recent Labs  Lab 11/09/19 1622  AST 22  ALT 20  ALKPHOS 60  BILITOT 0.5  PROT 6.0*  ALBUMIN 3.3*   CBG: Recent Labs  Lab 11/09/19 1629  GLUCAP 113*     Recent Results (from the past 240 hour(s))  SARS Coronavirus 2 by RT PCR (hospital order, performed in [facility] hospital lab) Nasopharyngeal Nasopharyngeal Swab     Status: None   Collection Time: 11/09/19  1:17 PM   Specimen: Nasopharyngeal Swab  Result Value Ref Range Status   SARS Coronavirus 2 NEGATIVE NEGATIVE Final    Comment: (NOTE) SARS-CoV-2 target nucleic acids are NOT DETECTED.  The SARS-CoV-2 RNA is generally detectable in upper and lower respiratory specimens during the acute phase of infection. The lowest concentration of SARS-CoV-2 viral copies this assay can detect is 250 copies / mL. A negative result does not preclude SARS-CoV-2 infection and should not be used as the sole basis for treatment or other patient management decisions.  A negative result may occur with improper specimen collection / handling, submission of specimen other than nasopharyngeal swab, presence of viral mutation(s) within the areas targeted by this assay, and inadequate number of viral copies (<250 copies / mL). A negative result must be combined with clinical observations, patient history, and epidemiological information.  Fact Sheet for Patients:   StrictlyIdeas.no  Fact Sheet for Healthcare Providers: BankingDealers.co.za  This test is not yet approved or  cleared by the [place] FDA and has been authorized for detection and/or diagnosis of SARS-CoV-2 by FDA under an Emergency Use Authorization (EUA).  This EUA will remain in effect (meaning this test can be used) for the duration of the COVID-19 declaration under  Section 564(b)(1) of the Act, 21 U.S.C. section 360bbb-3(b)(1), unless the authorization is terminated or revoked sooner.  Performed at [facility], [place] [address] [place], [place] [address]   Surgical PCR screen     Status: None   Collection Time: 11/09/19  4:10 PM   Specimen: Nasal Mucosa; Nasal Swab  Result Value Ref Range Status   MRSA, PCR NEGATIVE NEGATIVE Final   Staphylococcus aureus NEGATIVE NEGATIVE Final    Comment: (NOTE) The Xpert SA Assay (FDA approved for NASAL specimens in patients 85 years of age and older), is one component of a comprehensive surveillance program. It is not intended to diagnose infection nor to guide or monitor treatment. Performed at [facility], [place] [address] [place], [place] [address]       Radiology Studies: DG Forearm Left  Result Date: 11/09/2019 CLINICAL DATA:  Fall, wrist pain. EXAM: LEFT FOREARM - 2 VIEW  COMPARISON:  None. FINDINGS: Acute distal radial and ulnar fractures are better characterized on concurrent wrist radiographs. No additional fractures identified. The elbow is located. Soft tissues are unremarkable. IMPRESSION: Acute distal radial and ulnar fractures are better characterized on concurrent wrist radiographs. No additional fractures identified. Electronically Signed   By: Margaretha Sheffield MD   On: 11/09/2019 13:21   DG Wrist Complete Left  Result Date: 11/10/2019 CLINICAL DATA:  Open reduction and internal fixation of left wrist fracture. EXAM: LEFT WRIST - COMPLETE 3+ VIEW; DG C-ARM 1-60 MIN; RIGHT ANKLE - 2 VIEW Radiation exposure index: 1.71 mGy. COMPARISON:  November 09, 2019. FINDINGS: Five intraoperative fluoroscopic images were obtained of the left wrist. These demonstrate surgical internal fixation of distal radial fracture. Good alignment of fracture components is noted. Two intraoperative fluoroscopic images were obtained of the right ankle as well for surgical assessment. IMPRESSION: Fluoroscopic  guidance provided during open reduction and internal fixation of distal left radial fracture. Electronically Signed   By: Marijo Conception M.D.   On: 11/10/2019 12:47   DG Wrist Complete Left  Result Date: 11/09/2019 CLINICAL DATA:  Fall, wrist pain. EXAM: LEFT WRIST - COMPLETE 3+ VIEW COMPARISON:  None. FINDINGS: Acute impacted transverse fracture of the distal radial metaphysis with intra-articular extension and approximately 8 mm of dorsal displacement. Mild dorsal tilt. The distal fracture fragment articulates with the carpal bones. Acute minimally displaced fracture of the ulnar styloid. Soft tissue swelling about the wrist. Severe first CMC degenerative change. Moderate triscaphe degenerative change. IMPRESSION: 1. Acute impacted transverse distal radial metaphyseal fracture with intra-articular extension and dorsal displacement and tilt. 2. Minimally displaced ulnar styloid fracture. 3. Severe first CMC degenerative change. Electronically Signed   By: Margaretha Sheffield MD   On: 11/09/2019 13:26   DG Ankle 2 Views Right  Result Date: 11/10/2019 CLINICAL DATA:  Open reduction and internal fixation of left wrist fracture. EXAM: LEFT WRIST - COMPLETE 3+ VIEW; DG C-ARM 1-60 MIN; RIGHT ANKLE - 2 VIEW Radiation exposure index: 1.71 mGy. COMPARISON:  November 09, 2019. FINDINGS: Five intraoperative fluoroscopic images were obtained of the left wrist. These demonstrate surgical internal fixation of distal radial fracture. Good alignment of fracture components is noted. Two intraoperative fluoroscopic images were obtained of the right ankle as well for surgical assessment. IMPRESSION: Fluoroscopic guidance provided during open reduction and internal fixation of distal left radial fracture. Electronically Signed   By: Marijo Conception M.D.   On: 11/10/2019 12:47   DG Ankle Complete Right  Result Date: 11/09/2019 CLINICAL DATA:  Fall. EXAM: RIGHT ANKLE - COMPLETE 3+ VIEW COMPARISON:  None. FINDINGS: Nondisplaced  fracture distal fibula. No fracture of the tibia. Ankle mortise intact. Small joint effusion. Mild degenerative change in the midfoot.  Mild calcaneal spurring. IMPRESSION: Nondisplaced fracture  distal fibula. Electronically Signed   By: Franchot Gallo M.D.   On: 11/09/2019 13:21   DG Foot Complete Right  Result Date: 11/09/2019 CLINICAL DATA:  Right foot pain laterally after fall EXAM: RIGHT FOOT COMPLETE - 3+ VIEW COMPARISON:  None. FINDINGS: Possible nondisplaced fracture of the fifth toe proximal phalanx diaphysis. Partially visualized nondisplaced distal right fibular metaphyseal fracture. Mild degenerative changes throughout the midfoot and at the first MTP joint. Diffuse soft tissue swelling predominantly at the ankle. IMPRESSION: 1. Possible nondisplaced fracture of the fifth toe proximal phalanx. Correlate with point tenderness. 2. Partially visualized nondisplaced distal right fibular metaphyseal fracture. See dedicated right ankle views. Electronically Signed   By:  Nicholas  Plundo D.O.   On: 11/09/2019 13:24   DG C-Arm 1-60 Min  Result Date: 11/10/2019 CLINICAL DATA:  Open reduction and internal fixation of left wrist fracture. EXAM: LEFT WRIST - COMPLETE 3+ VIEW; DG C-ARM 1-60 MIN; RIGHT ANKLE - 2 VIEW Radiation exposure index: 1.71 mGy. COMPARISON:  November 09, 2019. FINDINGS: Five intraoperative fluoroscopic images were obtained of the left wrist. These demonstrate surgical internal fixation of distal radial fracture. Good alignment of fracture components is noted. Two intraoperative fluoroscopic images were obtained of the right ankle as well for surgical assessment. IMPRESSION: Fluoroscopic guidance provided during open reduction and internal fixation of distal left radial fracture. Electronically Signed   By: Marijo Conception M.D.   On: 11/10/2019 12:47     Scheduled Meds: . [START ON 11/11/2019] atorvastatin  40 mg Oral QHS  . citalopram  20 mg Oral QHS  . [START ON 11/11/2019]  enoxaparin (LOVENOX) injection  40 mg Subcutaneous Q24H  . [START ON 11/11/2019] loratadine  10 mg Oral QHS  . mupirocin ointment  1 application Nasal BID  . pantoprazole  40 mg Oral Daily   Continuous Infusions: . clindamycin (CLEOCIN) IV       LOS: 1 day    Donnamae Jude, MD 11/10/2019 4:40 PM [phone] [facility] If 7PM-7AM, please contact night-coverage 11/10/2019, 4:40 PM

## 2019-11-10 NOTE — Anesthesia Postprocedure Evaluation (Signed)
Anesthesia Post Note  Patient: Monique Gonzalez  Procedure(s) Performed: OPEN REDUCTION INTERNAL FIXATION (ORIF) WRIST FRACTURE (Left Wrist) POSSIBLEOPEN REDUCTION INTERNAL FIXATION (ORIF) ANKLE FRACTURE (Right Ankle)     Patient location during evaluation: PACU Anesthesia Type: Regional and General Level of consciousness: awake and alert, oriented and patient cooperative Pain management: pain level controlled Vital Signs Assessment: post-procedure vital signs reviewed and stable Respiratory status: spontaneous breathing, nonlabored ventilation and respiratory function stable Cardiovascular status: blood pressure returned to baseline and stable Postop Assessment: no apparent nausea or vomiting Anesthetic complications: no   No complications documented.  Last Vitals:  Vitals:   11/10/19 1238 11/10/19 1252  BP: (!) 163/94 (!) 158/91  Pulse: 97 94  Resp: 12 16  Temp: 36.5 C 36.9 C  SpO2: 96% 97%    Last Pain:  Vitals:   11/10/19 1629  TempSrc:   PainSc: 6    Pain Goal: Patients Stated Pain Goal: 3 (11/10/19 [name])                 Pervis Hocking

## 2019-11-10 NOTE — Anesthesia Procedure Notes (Signed)

## 2019-11-10 NOTE — Progress Notes (Signed)
Orthopedic Tech Progress Note Patient Details:  Monique Gonzalez 1955-11-19 [id number]  Ortho Devices Type of Ortho Device: Arm sling Ortho Device/Splint Location: RUE Ortho Device/Splint Interventions: Application, Ordered   Post Interventions Patient Tolerated: Well Instructions Provided: Care of device   [name] [name] 11/10/2019, 12:31 PM

## 2019-11-10 NOTE — ED Notes (Signed)
Unable to get a call out every line is busy and the operator is answering at [place] to call report

## 2019-11-10 NOTE — Interval H&P Note (Signed)
History and Physical Interval Note:  11/10/2019 9:50 AM  Monique Gonzalez  has presented today for surgery, with the diagnosis of Left wrist, right ankle fxs.  The various methods of treatment have been discussed with the patient and family. After consideration of risks, benefits and other options for treatment, the patient has consented to  Procedure(s): OPEN REDUCTION INTERNAL FIXATION (ORIF) WRIST FRACTURE (Left) POSSIBLEOPEN REDUCTION INTERNAL FIXATION (ORIF) ANKLE FRACTURE (Right) as a surgical intervention.  The patient's history has been reviewed, patient examined, no change in status, stable for surgery.  I have reviewed the patient's chart and labs.  Questions were answered to the patient's satisfaction.     Lennette Bihari P Betti Goodenow

## 2019-11-10 NOTE — Progress Notes (Signed)
Notified Silvestre Gunner, PA that enhanced recovery drink not given at 4:30

## 2019-11-11 ENCOUNTER — Encounter (HOSPITAL_COMMUNITY): Payer: Self-pay | Admitting: Student

## 2019-11-11 DIAGNOSIS — Z6837 Body mass index (BMI) 37.0-37.9, adult: Secondary | ICD-10-CM

## 2019-11-11 DIAGNOSIS — S82891A Other fracture of right lower leg, initial encounter for closed fracture: Secondary | ICD-10-CM

## 2019-11-11 DIAGNOSIS — S52592A Other fractures of lower end of left radius, initial encounter for closed fracture: Secondary | ICD-10-CM

## 2019-11-11 DIAGNOSIS — E669 Obesity, unspecified: Secondary | ICD-10-CM

## 2019-11-11 LAB — CBC
HCT: 34.3 % — ABNORMAL LOW (ref 36.0–46.0)
Hemoglobin: 11.1 g/dL — ABNORMAL LOW (ref 12.0–15.0)
MCH: 29 pg (ref 26.0–34.0)
MCHC: 32.4 g/dL (ref 30.0–36.0)
MCV: 89.6 fL (ref 80.0–100.0)
Platelets: 226 10*3/uL (ref 150–400)
RBC: 3.83 MIL/uL — ABNORMAL LOW (ref 3.87–5.11)
RDW: 13.3 % (ref 11.5–15.5)
WBC: 8.5 10*3/uL (ref 4.0–10.5)
nRBC: 0 % (ref 0.0–0.2)

## 2019-11-11 LAB — VITAMIN D 25 HYDROXY (VIT D DEFICIENCY, FRACTURES): Vit D, 25-Hydroxy: 39.29 ng/mL (ref 30–100)

## 2019-11-11 MED ORDER — HYDROMORPHONE HCL 1 MG/ML IJ SOLN
0.5000 mg | INTRAMUSCULAR | Status: DC | PRN
  Administered 2019-11-11 – 2019-11-12 (×4): 0.5 mg via INTRAVENOUS
  Filled 2019-11-11 (×4): qty 1

## 2019-11-11 MED ORDER — ASPIRIN EC 325 MG PO TBEC: 325.0000 mg | DELAYED_RELEASE_TABLET | Freq: Two times a day (BID) | ORAL | 0 refills | Status: AC

## 2019-11-11 MED ORDER — HYDROCODONE-ACETAMINOPHEN 5-325 MG PO TABS
1.0000 | ORAL_TABLET | Freq: Four times a day (QID) | ORAL | Status: DC | PRN
  Administered 2019-11-11: 1 via ORAL
  Administered 2019-11-11 – 2019-11-12 (×3): 2 via ORAL
  Filled 2019-11-11 (×3): qty 2

## 2019-11-11 MED ORDER — HYDROCODONE-ACETAMINOPHEN 5-325 MG PO TABS
1.0000 | ORAL_TABLET | Freq: Four times a day (QID) | ORAL | 0 refills | Status: DC | PRN
Start: 2019-11-11 — End: 2020-02-19

## 2019-11-11 NOTE — Care Management (Addendum)
Assessment completed by another [facility] member, note to follow.   Tiffany with [facility] accepted referral for HHPT/OT .   Ordered rolling walker with left platform attachment with Debbie with [facility].   Will need Home health orders and face to face   Magdalen Spatz RN

## 2019-11-11 NOTE — Care Management (Signed)
Assessment completed by another [facility] member, note to follow.   Tiffany with [facility] accepted referral for HHPT/OT .   Ordered rolling walker with left platform attachment with Debbie with [facility].   Will need Home health orders and face to face   Magdalen Spatz RN

## 2019-11-11 NOTE — Progress Notes (Signed)
[name], female   DOB: 1955-09-01, 64 y.o.   MRN: [id number]  PROGRESS NOTE    Monique Gonzalez  YSA:[id number] DOB: 19-Aug-1955 DOA: 11/09/2019 PCP: Susy Frizzle, MD    Brief Narrative:  Monique Gonzalez is a 64 y.o. female with medical history significant of HLD, morbid obesity, depression/anxiety, presented with left wrist pain and deformity and right ankle pain and deformity after mechanical fall.  Patient was bending down to reach her morning paper on the floor outside garage and fell onto wet concrete, immediately hard to crack on her left wrist.  Unable to get up because severe right ankle pain and left wrist pain called 911.  Denies loss of consciousness, no prodromes of lightheadedness evaluation before this happened, she attributed her fall to wet and slippery concrete.  Denied any direct impact of her body, neck or head. X-rays showed acute impacted transverse distal radius metaphyseal fracture, with displacement and tilt, minimum displaced ulnar styloid fracture; distal fibular fracture non-displaced  11/11/2019: Patient is stable for discharge from medical point.  I understand that the plan is to discharge the patient be discharged tomorrow.   Assessment & Plan:   Active Problems:   Hyperlipidemia   Class 2 obesity without serious comorbidity with body mass index (BMI) of 37.0 to 37.9 in adult   Vitamin D deficiency   Closed right ankle fracture   Ankle fracture   Closed fracture of left distal radius   Fall   Left wrist/distal radial and ulnar fracture -Secondary to mechanical fall, s/p ORIF -DVT prophylaxis 11/11/2019: Orthopedic team is directing care.  Right ankle fracture -Also plan to try closed reduction under fluoroscopy, possible ORIF if fails -PT afterwards 11/11/2019: Orthopedic team is directing care.  HLD -Statin  Anxiety depression -SSRI  GERD -PPI  Vitamin D deficiency -level 35    DVT prophylaxis: Lovenox SQ Code Status: Full  code  Family Communication: patient at bedside Disposition Plan: home with PT   Consultants:   Orthopedics  Procedures:  ORIF left wrist  Antimicrobials: Anti-infectives (From admission, onward)   Start     Dose/Rate Route Frequency Ordered Stop   11/10/19 1700  clindamycin (CLEOCIN) IVPB 900 mg       Note to Pharmacy: Please give 8 hours from intraoperative dose   900 mg 100 mL/hr over 30 Minutes Intravenous Every 8 hours 11/10/19 1257 11/11/19 0917   11/10/19 1101  vancomycin (VANCOCIN) powder  Status:  Discontinued          As needed 11/10/19 1102 11/10/19 1152   11/10/19 0800  vancomycin (VANCOREADY) IVPB 1500 mg/300 mL        1,500 mg 150 mL/hr over 120 Minutes Intravenous To Short Stay 11/10/19 0735 11/10/19 1117       Subjective: Patient was seen alongside patient's sister. No complaints.  Objective: Vitals:   11/11/19 0142 11/11/19 0544 11/11/19 1003 11/11/19 1345  BP: (!) 149/79 137/83 130/77 (!) 155/79  Pulse: 85 83 85 83  Resp: 16 19 18 16   Temp: 97.7 F (36.5 C) 97.6 F (36.4 C) 98.7 F (37.1 C) 97.8 F (36.6 C)  TempSrc: Oral Oral Oral Oral  SpO2: 98% 100% 99% 100%  Weight:      Height:        Intake/Output Summary (Last 24 hours) at 11/11/2019 1848 Last data filed at 11/11/2019 1830 Gross per 24 hour  Intake 770 ml  Output 850 ml  Net -80 ml   [employer]  11/09/19 1632 11/10/19 0856  Weight: 132.9 kg 132.9 kg    Examination:  General exam: Appears calm and comfortable.  Patient is morbidly obese. Respiratory system: Clear to auscultation.  Cardiovascular system: S1 & S2 heard.  Gastrointestinal system: Abdomen is obese, soft and nontender.  Organs are not palpable.   Central nervous system: Awake and alert.  Patient moves all extremities.   Extremities: right foot in boot, ace wrap on left arm  Data Reviewed: I have personally reviewed following labs and imaging studies  CBC: Recent Labs  Lab 11/09/19 1622 11/11/19 0102  WBC  6.6 8.5  NEUTROABS 5.1  --   HGB 11.3* 11.1*  HCT 35.8* 34.3*  MCV 89.3 89.6  PLT 255 097   Basic Metabolic Panel: Recent Labs  Lab 11/09/19 1622  NA 140  K 4.2  CL 106  CO2 25  GLUCOSE 113*  BUN 13  CREATININE 0.89  CALCIUM 9.0   GFR: Estimated Creatinine Clearance: 92.6 mL/min (by C-G formula based on SCr of 0.89 mg/dL). Liver Function Tests: Recent Labs  Lab 11/09/19 1622  AST 22  ALT 20  ALKPHOS 60  BILITOT 0.5  PROT 6.0*  ALBUMIN 3.3*   CBG: Recent Labs  Lab 11/09/19 1629  GLUCAP 113*     Recent Results (from the past 240 hour(s))  SARS Coronavirus 2 by RT PCR (hospital order, performed in [facility] hospital lab) Nasopharyngeal Nasopharyngeal Swab     Status: None   Collection Time: 11/09/19  1:17 PM   Specimen: Nasopharyngeal Swab  Result Value Ref Range Status   SARS Coronavirus 2 NEGATIVE NEGATIVE Final    Comment: (NOTE) SARS-CoV-2 target nucleic acids are NOT DETECTED.  The SARS-CoV-2 RNA is generally detectable in upper and lower respiratory specimens during the acute phase of infection. The lowest concentration of SARS-CoV-2 viral copies this assay can detect is 250 copies / mL. A negative result does not preclude SARS-CoV-2 infection and should not be used as the sole basis for treatment or other patient management decisions.  A negative result may occur with improper specimen collection / handling, submission of specimen other than nasopharyngeal swab, presence of viral mutation(s) within the areas targeted by this assay, and inadequate number of viral copies (<250 copies / mL). A negative result must be combined with clinical observations, patient history, and epidemiological information.  Fact Sheet for Patients:   StrictlyIdeas.no  Fact Sheet for Healthcare Providers: BankingDealers.co.za  This test is not yet approved or  cleared by the [place] FDA and has been authorized for  detection and/or diagnosis of SARS-CoV-2 by FDA under an Emergency Use Authorization (EUA).  This EUA will remain in effect (meaning this test can be used) for the duration of the COVID-19 declaration under Section 564(b)(1) of the Act, 21 U.S.C. section 360bbb-3(b)(1), unless the authorization is terminated or revoked sooner.  Performed at [facility], [place] [address], [place], [place] [address]   Surgical PCR screen     Status: None   Collection Time: 11/09/19  4:10 PM   Specimen: Nasal Mucosa; Nasal Swab  Result Value Ref Range Status   MRSA, PCR NEGATIVE NEGATIVE Final   Staphylococcus aureus NEGATIVE NEGATIVE Final    Comment: (NOTE) The Xpert SA Assay (FDA approved for NASAL specimens in patients 80 years of age and older), is one component of a comprehensive surveillance program. It is not intended to diagnose infection nor to guide or monitor treatment. Performed at [facility],  [address] [place], [place] [address]       Radiology Studies: DG Wrist Complete Left  Result Date: 11/10/2019 CLINICAL DATA:  Open reduction and internal fixation of left wrist fracture. EXAM: LEFT WRIST - COMPLETE 3+ VIEW; DG C-ARM 1-60 MIN; RIGHT ANKLE - 2 VIEW Radiation exposure index: 1.71 mGy. COMPARISON:  November 09, 2019. FINDINGS: Five intraoperative fluoroscopic images were obtained of the left wrist. These demonstrate surgical internal fixation of distal radial fracture. Good alignment of fracture components is noted. Two intraoperative fluoroscopic images were obtained of the right ankle as well for surgical assessment. IMPRESSION: Fluoroscopic guidance provided during open reduction and internal fixation of distal left radial fracture. Electronically Signed   By: Marijo Conception M.D.   On: 11/10/2019 12:47   DG Ankle 2 Views Right  Result Date: 11/10/2019 CLINICAL DATA:  Open reduction and internal fixation of left wrist fracture. EXAM: LEFT WRIST - COMPLETE 3+ VIEW; DG  C-ARM 1-60 MIN; RIGHT ANKLE - 2 VIEW Radiation exposure index: 1.71 mGy. COMPARISON:  November 09, 2019. FINDINGS: Five intraoperative fluoroscopic images were obtained of the left wrist. These demonstrate surgical internal fixation of distal radial fracture. Good alignment of fracture components is noted. Two intraoperative fluoroscopic images were obtained of the right ankle as well for surgical assessment. IMPRESSION: Fluoroscopic guidance provided during open reduction and internal fixation of distal left radial fracture. Electronically Signed   By: Marijo Conception M.D.   On: 11/10/2019 12:47   DG C-Arm 1-60 Min  Result Date: 11/10/2019 CLINICAL DATA:  Open reduction and internal fixation of left wrist fracture. EXAM: LEFT WRIST - COMPLETE 3+ VIEW; DG C-ARM 1-60 MIN; RIGHT ANKLE - 2 VIEW Radiation exposure index: 1.71 mGy. COMPARISON:  November 09, 2019. FINDINGS: Five intraoperative fluoroscopic images were obtained of the left wrist. These demonstrate surgical internal fixation of distal radial fracture. Good alignment of fracture components is noted. Two intraoperative fluoroscopic images were obtained of the right ankle as well for surgical assessment. IMPRESSION: Fluoroscopic guidance provided during open reduction and internal fixation of distal left radial fracture. Electronically Signed   By: Marijo Conception M.D.   On: 11/10/2019 12:47     Scheduled Meds: . atorvastatin  40 mg Oral QHS  . citalopram  20 mg Oral QHS  . enoxaparin (LOVENOX) injection  40 mg Subcutaneous Q24H  . loratadine  10 mg Oral QHS  . mupirocin ointment  1 application Nasal BID  . pantoprazole  40 mg Oral Daily   Continuous Infusions:    LOS: 2 days    Bonnell Public, MD 11/11/2019 6:48 PM  [facility] If 7PM-7AM, please contact night-coverage 11/11/2019, 6:48 PM

## 2019-11-11 NOTE — TOC CAGE-AID Note (Signed)
Transition of Care [facility]) - CAGE-AID Screening   Patient Details  Name: Monique Gonzalez MRN: [id number] Date of Birth: 09/26/1955  Transition of Care [facility]) CM/SW Contact:    Emeterio Reeve, [place] Phone Number: 11/11/2019, 1:42 PM   Clinical Narrative:  CSW met with pt at bedside. CSW introduced self and explained her role at the hospital.  Pt reports an occasional alcoholic beverage a couple times a month. Pt denies substance use. PT did not need resources at this time.    CAGE-AID Screening:    Have You Ever Felt You Ought to Cut Down on Your Drinking or Drug Use?: No Have People Annoyed You By Critizing Your Drinking Or Drug Use?: No Have You Felt Bad Or Guilty About Your Drinking Or Drug Use?: No Have You Ever Had a Drink or Used Drugs First Thing In The Morning to Steady Your Nerves or to Get Rid of a Hangover?: No CAGE-AID Score: 0  Substance Abuse Education Offered: Yes

## 2019-11-11 NOTE — Progress Notes (Addendum)
Orthopaedic Trauma Progress Note  S: Doing well, pain controlled. Nerve block has worn off and she is getting feeling back in her fingers. Is glad her right ankle didn't require surgery and that she will be able to weightbear on the right leg in her boot.  O:  Vitals:   11/11/19 0142 11/11/19 0544  BP: (!) 149/79 137/83  Pulse: 85 83  Resp: 16 19  Temp: 97.7 F (36.5 C) 97.6 F (36.4 C)  SpO2: 98% 100%    General: Sitting up in bed, no acute distress Respiratory: No increased work of breathing.  Left Upper Extremity: Splint in place, dressing C/D/I.  Nontender above splint.  Full elbow motion.  Able to wiggle fingers.  Sensation and motor function intact to radial, median, ulnar nerve distributions.  Imaging: Stable post op imaging.   Labs:  Results for orders placed or performed during the hospital encounter of 11/09/19 (from the past 24 hour(s))  CBC     Status: Abnormal   Collection Time: 11/11/19  1:02 AM  Result Value Ref Range   WBC 8.5 4.0 - 10.5 K/uL   RBC 3.83 (L) 3.87 - 5.11 MIL/uL   Hemoglobin 11.1 (L) 12.0 - 15.0 g/dL   HCT 34.3 (L) 36 - 46 %   MCV 89.6 80.0 - 100.0 fL   MCH 29.0 26.0 - 34.0 pg   MCHC 32.4 30.0 - 36.0 g/dL   RDW 13.3 11.5 - 15.5 %   Platelets 226 150 - 400 K/uL   nRBC 0.0 0.0 - 0.2 %    Assessment: 64 year old female status post fall, 1 Day Post-Op   Injuries: 1.  Left extra-articular distal radius fracture/splint ORIF 2.  Right lateral malleolus fracture s/p stress exam under anesthesia with closed treatment  Weightbearing: NWB through left wrist, ok for WB thru elbow. WBAT RLE  Insicional and dressing care: Leave LUE splint in place until follow up  Showering: Okay to shower, keep splint covered  Orthopedic device(s): CAM boot RLE, splint LUE  CV/Blood loss: Hgb 11.1 this morning. Hemodynamically stable  Pain management:  1. Tylenol 650 mg q 6 hours PRN 2. Flexeril 10 mg TID PRN 3. Norco 5-325 mg q 6 hours PRN 4. Dilaudid 0.5 mg q 2  hours PRN  VTE prophylaxis: Lovenox, SCDs  ID: Clindamycin post op  Foley/Lines:  No foley, KVO IVFs  Medical co-morbidities: HLD, morbid obesity, depression/anxiety  Impediments to Fracture Healing: Vit D level looks good at 35, no need for supplementation  Dispo: PT/OT eval today. Plan for d/c home tomorrow morning if cleared by medicine team and therapies. Will transition to Aspirin for DVT prophylaxis at discharge. Pain med and DVT prophylaxis rx sent to pharmacy on file per patients request  Follow - up plan: 2 weeks  Contact information:  Katha Hamming MD, Patrecia Pace PA-C   [facility] [phone] (office) [email or web address]

## 2019-11-11 NOTE — Evaluation (Signed)
Physical Therapy Evaluation Patient Details Name: Monique Gonzalez MRN: [id number] DOB: [birth year] Today's Date: 11/11/2019   History of Present Illness  64 y.o. female with medical history significant of HLD, morbid obesity, depression/anxiety, presented with left wrist pain and deformity and right ankle pain and deformity after mechanical fall at home. Xray revealed L wrist fx and R lateral malleolus fx. She underwent ORIF L wrist 9/2. Plan for nonoperative management of R ankle.    Clinical Impression  Pt admitted with above diagnosis. PTA pt independent with mobility. She is retired and lives with her 2 sisters. On eval, she required min guard assist bed mobility, min assist sit to stand, and mod hand held assist SPT. Pt with h/o vagal response following sx. BP monitored and remained stable during mobility. Pt without c/o dizziness. L platform RW will be needed to progress ambulation. Platform RW set up and left in pt room.  Pt will benefit from skilled PT to increase their independence and safety with mobility to allow discharge to the venue listed below.       Follow Up Recommendations Supervision/Assistance - 24 hour;Home health PT    Equipment Recommendations  Rolling walker with 5" wheels (L platform attachment)    Recommendations for Other Services       Precautions / Restrictions Precautions Precautions: Fall Required Braces or Orthoses: Other Brace;Splint/Cast Splint/Cast: short arm splint LUE place in OR Other Brace: CAM boot RLE, sling for comfort LUE Restrictions Weight Bearing Restrictions: Yes LUE Weight Bearing: Weight bear through elbow only RLE Weight Bearing: Weight bearing as tolerated (in CAM boot)      Mobility  Bed Mobility Overal bed mobility: Needs Assistance Bed Mobility: Supine to Sit     Supine to sit: HOB elevated;Min guard     General bed mobility comments: +rail, increased time, no physical assist needed  Transfers Overall transfer level:  Needs assistance Equipment used: 1 person hand held assist Transfers: Sit to/from [facility] Sit to Stand: From elevated surface;Min assist Stand pivot transfers: Mod assist       General transfer comment: assist to power up on R side. Therapist provided support RUE, simulating SPC/QC, during SPT. Pt pivoting toward L.  Ambulation/Gait                Stairs            Wheelchair Mobility    Modified Rankin (Stroke Patients Only)       Balance Overall balance assessment: Needs assistance Sitting-balance support: No upper extremity supported;Feet supported Sitting balance-Leahy Scale: Good     Standing balance support: Single extremity supported;During functional activity Standing balance-Leahy Scale: Poor Standing balance comment: reliant on external support                             Pertinent Vitals/Pain Pain Assessment: 0-10 Pain Score: 5  Pain Location: R ankle Pain Descriptors / Indicators: Aching;Discomfort Pain Intervention(s): Limited activity within patient's tolerance;Repositioned;Monitored during session;Premedicated before session;Ice applied    Home Living Family/patient expects to be discharged to:: Private residence Living Arrangements: Other relatives (sisters) Available Help at Discharge: Family;Available 24 hours/day Type of Home: House Home Access: Stairs to enter Entrance Stairs-Rails: None Entrance Stairs-Number of Steps: 2 (platform steps, deep enough for RW) Home Layout: Multi-level;Able to live on main level with bedroom/bathroom (also has stair lift to 2nd floor, where her bedroom is located) Home Equipment: Shower seat - built in;Walker -  2 wheels;Hand held shower head;Toilet riser;Cane - single point;Cane - quad;Grab bars - tub/shower      Prior Function Level of Independence: Independent               Hand Dominance   Dominant Hand: Right    Extremity/Trunk Assessment   Upper  Extremity Assessment Upper Extremity Assessment: LUE deficits/detail LUE Deficits / Details: short arm splint, s/p ORIF L wrist    Lower Extremity Assessment Lower Extremity Assessment: RLE deficits/detail RLE Deficits / Details: lateral malleolus fx, CAM boot in place    Cervical / Trunk Assessment Cervical / Trunk Assessment: Normal  Communication   Communication: No difficulties  Cognition Arousal/Alertness: Awake/alert Behavior During Therapy: WFL for tasks assessed/performed Overall Cognitive Status: Within Functional Limits for tasks assessed                                        General Comments General comments (skin integrity, edema, etc.): BP monitored due to pt with h/o vagal response following surgery. BP supine 147/107, BP sitting EOB 159/87. Pt without c/o dizziness. Proceeded with transfer. BP seated in recliner 151/87.    Exercises     Assessment/Plan    PT Assessment Patient needs continued PT services  PT Problem List Decreased strength;Decreased mobility;Obesity;Decreased knowledge of precautions;Decreased activity tolerance;Pain;Decreased balance;Decreased knowledge of use of DME       PT Treatment Interventions DME instruction;Therapeutic activities;Gait training;Therapeutic exercise;Patient/family education;Stair training;Balance training;Functional mobility training    PT Goals (Current goals can be found in the Care Plan section)  Acute Rehab PT Goals Patient Stated Goal: home PT Goal Formulation: With patient Time For Goal Achievement: 11/25/19 Potential to Achieve Goals: Good    Frequency Min 5X/week   Barriers to discharge        Co-evaluation               AM-PAC PT "6 Clicks" Mobility  Outcome Measure Help needed turning from your back to your side while in a flat bed without using bedrails?: A Little Help needed moving from lying on your back to sitting on the side of a flat bed without using bedrails?: A  Little Help needed moving to and from a bed to a chair (including a wheelchair)?: A Little Help needed standing up from a chair using your arms (e.g., wheelchair or bedside chair)?: A Little Help needed to walk in hospital room?: A Little Help needed climbing 3-5 steps with a railing? : A Lot 6 Click Score: 17    End of Session Equipment Utilized During Treatment: Gait belt;Other (comment) (CAM boot RLE) Activity Tolerance: Patient tolerated treatment well Patient left: in chair;with call bell/phone within reach Nurse Communication: Mobility status PT Visit Diagnosis: Difficulty in walking, not elsewhere classified (R26.2);Pain Pain - Right/Left: Right Pain - part of body: Ankle and joints of foot    Time: 2297-9892 PT Time Calculation (min) (ACUTE ONLY): 48 min   Charges:   PT Evaluation $PT Eval Moderate Complexity: 1 Mod PT Treatments $Gait Training: 8-22 mins $Therapeutic Activity: 8-22 mins        Lorrin Goodell, PT  Office # [phone] Pager [phone]   Monique Gonzalez 11/11/2019, 10:09 AM

## 2019-11-11 NOTE — TOC Initial Note (Signed)
Transition of Care [facility]) - Initial/Assessment Note    Patient Details  Name: Monique Gonzalez MRN: [id number] Date of Birth: 1955-12-09  Transition of Care [facility]) CM/SW Contact:    Emeterio Reeve, [place] Phone Number: 11/11/2019, 12:28 PM  Clinical Narrative:                  CSW met with pt at bedside. CSW introduced self and explained her role at the hospital.  PT stated that PTA she was living at home with her two sisters. Pt reports she lives in a two level home and her bedroom is on the 2nd floor. Pt reports she has a chair lift that can get her up and down the stairs as needed. Pt reports she was completely independent, used no assistive devices and was independent of all ADL's.   CSW reviewed pt reccs for SNF. Pt declined. Pt reports she will return home and she has 24 hour assistance. Pt reports one sister is retired and is able to support her. [facility] HH[facility]. Pt reports having multiple DME pieces that belonged to her mother that she can use. Pt reports she will need a walker.   CSW inquired if pt will need ptar home or go in personal car. Pt stated she believes she can fit in the car and go home with her sisters.   Expected Discharge Plan: Skilled Nursing Facility Barriers to Discharge: Continued Medical Work up   Patient Goals and CMS Choice Patient states their goals for this hospitalization and ongoing recovery are:: To return home CMS Medicare.gov Compare Post Acute Care list provided to:: Patient Choice offered to / list presented to : Patient  Expected Discharge Plan and Services Expected Discharge Plan: [place] arrangements for the past 2 months: Single Family Home                                      Prior Living Arrangements/Services Living arrangements for the past 2 months: Single Family Home Lives with:: Siblings Patient language and need for interpreter reviewed:: Yes Do you feel safe  going back to the place where you live?: Yes      Need for Family Participation in Patient Care: Yes (Comment) Care giver support system in place?: Yes (comment)   Criminal Activity/Legal Involvement Pertinent to Current Situation/Hospitalization: No - Comment as needed  Activities of Daily Living      Permission Sought/Granted Permission sought to share information with : Chartered certified accountant granted to share information with : Yes, Verbal Permission Granted     Permission granted to share info w AGENCY: Home health        Emotional Assessment Appearance:: Appears stated age Attitude/Demeanor/Rapport: Engaged Affect (typically observed): Appropriate Orientation: : Oriented to Self, Oriented to Place, Oriented to  Time, Oriented to Situation Alcohol / Substance Use: Not Applicable Psych Involvement: No (comment)  Admission diagnosis:  Ankle fracture [S82.899A] Fall, initial encounter [W19.XXXA] Closed displaced fracture of styloid process of left ulna, initial encounter [S52.612A] Other closed intra-articular fracture of distal end of left radius, initial encounter [S52.572A] Closed fracture of distal end of right fibula, unspecified fracture morphology, initial encounter [W09.811B] Patient Active Problem List   Diagnosis Date Noted  . Closed fracture of left distal radius 11/10/2019  . Fall 11/10/2019  . Closed  right ankle fracture 11/09/2019  . Ankle fracture 11/09/2019  . Closed displaced fracture of styloid process of left ulna   . Anxiety and depression 12/22/2016  . Insulin resistance 10/15/2016  . Presence of right artificial knee joint 10/14/2016  . Unilateral primary osteoarthritis, right knee 05/20/2016  . Class 2 obesity without serious comorbidity with body mass index (BMI) of 37.0 to 37.9 in adult 04/08/2016  . Vitamin D deficiency 04/08/2016  . Shortness of breath 03/25/2016  . Constipation 09/20/2013  . Recurrent cold sores   . Right  knee pain 11/23/2011  . Osteoarthritis   . Depression   . Hyperlipidemia    PCP:  Susy Frizzle, MD Pharmacy:   [place] [address] [place] [address] Phone: [phone] Fax: [phone]     Social Determinants of Health (SDOH) Interventions    Readmission Risk Interventions No flowsheet data found.  Emeterio Reeve, Latanya Presser, [place] Social Worker [phone]

## 2019-11-12 DIAGNOSIS — E785 Hyperlipidemia, unspecified: Secondary | ICD-10-CM

## 2019-11-12 DIAGNOSIS — W19XXXA Unspecified fall, initial encounter: Secondary | ICD-10-CM

## 2019-11-12 DIAGNOSIS — E559 Vitamin D deficiency, unspecified: Secondary | ICD-10-CM

## 2019-11-12 NOTE — Progress Notes (Signed)
Patient discharged to home with instructions. 

## 2019-11-12 NOTE — Progress Notes (Signed)
Physical Therapy Treatment Patient Details Name: Monique Gonzalez MRN: [id number] DOB: 02-27-1956 Today's Date: 11/12/2019    History of Present Illness 64 y.o. female with medical history significant of HLD, morbid obesity, depression/anxiety, presented with left wrist pain and deformity and right ankle pain and deformity after mechanical fall at home. Xray revealed L wrist fx and R lateral malleolus fx. She underwent ORIF L wrist 9/2. Plan for nonoperative management of R ankle.    PT Comments    Patient progressing nicely with mobility.  Able to ambulate some today with min-guard assistance.  Still needed some help to power up to standing from sitting - sister can provide.  Verbally reviewed up/down stairs.  Patient ready for d/c from PT standpoint.  I do recommend HHPT for further progression, balance training.    Follow Up Recommendations  Home health PT;Supervision for mobility/OOB     Equipment Recommendations  Rolling walker with 5" wheels (platform attachment; currently in room)    Recommendations for Other Services       Precautions / Restrictions Precautions Precautions: Fall Required Braces or Orthoses: Other Brace;Splint/Cast Splint/Cast: short arm splint LUE place in OR Other Brace: CAM boot RLE, sling for comfort LUE Restrictions Weight Bearing Restrictions: Yes LUE Weight Bearing: Weight bear through elbow only RLE Weight Bearing: Weight bearing as tolerated    Mobility  Bed Mobility               General bed mobility comments: sitting in chair upon arrival  Transfers Overall transfer level: Needs assistance Equipment used: Rolling walker (2 wheeled);Left platform walker Transfers: Sit to/from Stand Sit to Stand: Min assist         General transfer comment: min assist to power up to standing from low recliner.  Ambulation/Gait Ambulation/Gait assistance: Min guard Gait Distance (Feet): 25 Feet Assistive device: Rolling walker (2 wheeled);Left  platform walker Gait Pattern/deviations: Step-to pattern;Decreased stride length;Decreased weight shift to right Gait velocity: decreased       Stairs Stairs:  (verbally reviewed up/down stairs (up backwards))           Wheelchair Mobility    Modified Rankin (Stroke Patients Only)       Balance Overall balance assessment: Needs assistance Sitting-balance support: No upper extremity supported;Feet supported Sitting balance-Leahy Scale: Good     Standing balance support: Single extremity supported;During functional activity Standing balance-Leahy Scale: Poor Standing balance comment: reliant on RW/platform for balance                            Cognition Arousal/Alertness: Awake/alert Behavior During Therapy: WFL for tasks assessed/performed Overall Cognitive Status: Within Functional Limits for tasks assessed                                        Exercises      General Comments        Pertinent Vitals/Pain Pain Assessment: 0-10 Pain Score: 3  Pain Location: R ankle Pain Descriptors / Indicators: Aching;Discomfort Pain Intervention(s): Limited activity within patient's tolerance;Monitored during session    Home Living                      Prior Function            PT Goals (current goals can now be found in the care plan section) Progress towards PT  goals: Progressing toward goals    Frequency    Min 5X/week      PT Plan Current plan remains appropriate    Co-evaluation              AM-PAC PT "6 Clicks" Mobility   Outcome Measure  Help needed turning from your back to your side while in a flat bed without using bedrails?: A Little Help needed moving from lying on your back to sitting on the side of a flat bed without using bedrails?: A Little Help needed moving to and from a bed to a chair (including a wheelchair)?: A Little Help needed standing up from a chair using your arms (e.g., wheelchair  or bedside chair)?: A Little Help needed to walk in hospital room?: A Little Help needed climbing 3-5 steps with a railing? : A Little 6 Click Score: 18    End of Session Equipment Utilized During Treatment: Gait belt;Other (comment) (CAM boot right) Activity Tolerance: Patient tolerated treatment well Patient left: in chair;with call bell/phone within reach   PT Visit Diagnosis: Difficulty in walking, not elsewhere classified (R26.2);Pain     Time: 1423-9532 PT Time Calculation (min) (ACUTE ONLY): 20 min  Charges:  $Gait Training: 8-22 mins                     11/12/2019 Monique Gonzalez, PT Acute Rehabilitation Services Pager:  [phone] Office:  [phone]     Monique Gonzalez 11/12/2019, 12:26 PM

## 2019-11-12 NOTE — Discharge Summary (Signed)
Physician Discharge Summary  [name] MRN: [id number] DOB/AGE: 1955-10-31 64 y.o.  Admit date: 11/09/2019 Discharge date: 11/12/2019  Admission Diagnoses:  Discharge Diagnoses:  Active Problems:   Hyperlipidemia   Class 2 obesity without serious comorbidity with body mass index (BMI) of 37.0 to 37.9 in adult   Vitamin D deficiency   Closed right ankle fracture   Ankle fracture   Closed fracture of left distal radius   Fall   Discharged Condition: stable  Hospital Course: Patient is a 64 year old female with past medical history significant for hyperlipidemia,morbid obesity, depression and anxiety.  Patientpresented with left wrist pain and deformity and right ankle pain and deformity following a mechanical fall.  Patient was bending down to reach her morning paper on the floor outside garage and fell onto wetconcrete.  Following the fall, patient was unable to get up because severe right ankle pain and left wrist pain called 911.  X-rays showed acute impacted transverse distal radius metaphyseal fracture,with displacement and tilt,minimum displaced ulnar styloid fracture;distal fibular fracturenon-displaced  Left wrist/distal radial and ulnar fracture -Secondary to mechanical fall, s/p ORIF -Orthopedic team directed patient's care.  Right ankle fracture -Orthopedic team directed care.   -Patient underwent stress examination of ankle with closed treatment.  A  HLD -Statin  Anxiety depression -SSRI  GERD -PPI  Vitamin D deficiency -level 35   Consults: orthopedic surgery  Significant Diagnostic Studies:  X-ray left wrist revealed: 1. Acute impacted transverse distal radial metaphyseal fracture with intra-articular extension and dorsal displacement and tilt. 2. Minimally displaced ulnar styloid fracture. 3. Severe first CMC degenerative change.  X-ray left forearm reveals: -Acute distal radial and ulnar fractures are better characterized  on concurrent wrist radiographs. No additional fractures identified.  X-ray right foot revealed: 1. Possible nondisplaced fracture of the fifth toe proximal phalanx. Correlate with point tenderness. 2. Partially visualized nondisplaced distal right fibular metaphyseal fracture  X-ray right ankle reveals: Nondisplaced fracture of distal fibula  Treatments: ORIF left wrist (left distal radius).  Discharge Exam: Blood pressure (!) 150/77, pulse 75, temperature 98.5 F (36.9 C), temperature source Oral, resp. rate 16, height 5\' 10"  (1.778 m), weight 132.9 kg, SpO2 98 %.  Disposition: Discharge disposition: 06-Home-Health Care Svc   Discharge Instructions    Diet - low sodium heart healthy   Complete by: As directed    Discharge wound care:   Complete by: As directed    As per Orthopedic team   Increase activity slowly   Complete by: As directed      Allergies as of 11/12/2019      Reactions   Penicillins Rash, Other (See Comments)   Has patient had a PCN reaction causing immediate rash, facial/tongue/throat swelling, SOB or lightheadedness with hypotension: Yes Has patient had a PCN reaction causing severe rash involving mucus membranes or skin necrosis: Unknown Has patient had a PCN reaction that required hospitalization: No Has patient had a PCN reaction occurring within the last 10 years: No If all of the above answers are "NO", then may proceed with Cephalosporin use. Childhood reaction.   Sulfa Antibiotics Rash      Medication List    STOP taking these medications   clindamycin 150 MG capsule Commonly known as: CLEOCIN   ibuprofen 200 MG tablet Commonly known as: ADVIL   loratadine 10 MG tablet Commonly known as: CLARITIN   valACYclovir 1000 MG tablet Commonly known as: VALTREX     TAKE these medications   acetaminophen 650 MG  CR tablet Commonly known as: TYLENOL Take 1,300 mg by mouth at bedtime.   aspirin EC 325 MG tablet Take 1 tablet (325 mg total)  by mouth in the morning and at bedtime.   atorvastatin 40 MG tablet Commonly known as: LIPITOR Take 1 tablet (40 mg total) by mouth daily. What changed: when to take this   CALCIUM PO Take 1 tablet by mouth daily with breakfast.   cetirizine 10 MG tablet Commonly known as: ZYRTEC Take 10 mg by mouth See admin instructions. Take 10 mg by mouth at bedtime when not taking Claritin   citalopram 20 MG tablet Commonly known as: CELEXA Take 1 tablet (20 mg total) by mouth at bedtime.   cyclobenzaprine 10 MG tablet Commonly known as: FLEXERIL Take 1 tablet (10 mg total) by mouth 3 (three) times daily as needed for muscle spasms. What changed:   when to take this  additional instructions   HYDROcodone-acetaminophen 5-325 MG tablet Commonly known as: NORCO/VICODIN Take 1-2 tablets by mouth every 6 (six) hours as needed for moderate pain or severe pain (1 tablet moderate pain, 2 tablets severe pain).   ONE-A-DAY WOMENS PO Take 1 tablet by mouth daily with breakfast.   pantoprazole 40 MG tablet Commonly known as: PROTONIX Take 1 tablet (40 mg total) by mouth daily. What changed: when to take this   triamcinolone cream 0.1 % Commonly known as: KENALOG Apply 1 application topically 2 (two) times daily. What changed:   when to take this  reasons to take this   Vitamin D-3 25 MCG (1000 UT) Caps Take 1,000 Units by mouth daily with breakfast.            Durable Medical Equipment  (From admission, onward)         Start     Ordered   11/11/19 1032  For home use only DME Walker rolling  Once       Comments: Rolling walker with 5" wheels (L platform attachment)  Question Answer Comment  Walker: With [place] Wheels   Patient needs a walker to treat with the following condition Weakness      11/11/19 1032           Discharge Care Instructions  (From admission, onward)         Start     Ordered   11/12/19 0000  Discharge wound care:       Comments: As per  Orthopedic team   11/12/19 1230           Signed: Bonnell Public 11/12/2019, 12:31 PM

## 2019-11-12 NOTE — Evaluation (Signed)
Occupational Therapy Evaluation and Discharge Patient Details Name: Monique Gonzalez MRN: [id number] DOB: 23-Jul-1955 Today's Date: 11/12/2019    History of Present Illness 64 y.o. female with medical history significant of HLD, morbid obesity, depression/anxiety, presented with left wrist pain and deformity and right ankle pain and deformity after mechanical fall at home. Xray revealed L wrist fx and R lateral malleolus fx. She underwent ORIF L wrist 9/2. Plan for nonoperative management of R ankle.   Clinical Impression   This 64 yo female admitted with above seen today and all education completed, we will D/C from acute OT.    Follow Up Recommendations  No OT follow up;Supervision - Intermittent    Equipment Recommendations  None recommended by OT       Precautions / Restrictions Precautions Precautions: Fall Required Braces or Orthoses: Other Brace;Splint/Cast Splint/Cast: short arm splint LUE place in OR Other Brace: CAM boot RLE, sling for comfort LUE Restrictions Weight Bearing Restrictions: Yes LUE Weight Bearing: Weight bear through elbow only RLE Weight Bearing: Weight bearing as tolerated (in cam boot)      Mobility Bed Mobility               General bed mobility comments: sitting in chair upon arrival  Transfers Overall transfer level: Needs assistance Equipment used: Rolling walker (2 wheeled);Left platform walker Transfers: Sit to/from Stand Sit to Stand: Min guard            Balance Overall balance assessment: Needs assistance Sitting-balance support: No upper extremity supported;Feet supported Sitting balance-Leahy Scale: Good     Standing balance support: Single extremity supported;During functional activity Standing balance-Leahy Scale: Poor Standing balance comment: reliant on RW/platform for balance                           ADL either performed or assessed with clinical judgement   ADL Overall ADL's : Needs  assistance/impaired Eating/Feeding: Set up;Sitting   Grooming: Set up;Sitting   Upper Body Bathing: Minimal assistance;Sitting   Lower Body Bathing: Moderate assistance Lower Body Bathing Details (indicate cue type and reason): min guard A sit<>stand Upper Body Dressing : Minimal assistance;Sitting   Lower Body Dressing: Moderate assistance Lower Body Dressing Details (indicate cue type and reason): min guard A sit<>stand Toilet Transfer: Min guard;Ambulation;BSC Toilet Transfer Details (indicate cue type and reason): LUE PFRW, BSC over toilet in bathroom Toileting- Clothing Manipulation and Hygiene: Min guard;Sit to/from stand Toileting - Clothing Manipulation Details (indicate cue type and reason): toileting hygiene             Vision Patient Visual Report: No change from baseline              Pertinent Vitals/Pain Pain Assessment: No/denies pain      Hand Dominance Right   Extremity/Trunk Assessment Upper Extremity Assessment Upper Extremity Assessment: LUE deficits/detail LUE Deficits / Details: short arm splint, s/p ORIF L wrist; pt able to move all 5 digits and can oppose thumb and 2nd/3rd digits, full AROM at elbow and shoulder. LUE Coordination: decreased fine motor;decreased gross motor           Communication Communication Communication: No difficulties   Cognition Arousal/Alertness: Awake/alert Behavior During Therapy: WFL for tasks assessed/performed Overall Cognitive Status: Within Functional Limits for tasks assessed  Exercises Other Exercises Other Exercises: Pt encouraged to move her fingers during every commerical when watching TV and to move her elbow and shoulder 10 reps every 1-2 hours. Also she is aware she needs to keep her arm elevated where hand is higher than elbow and preferably hand higher than heart.        Home Living Family/patient expects to be discharged to:: Private  residence Living Arrangements: Other relatives (sisters) Available Help at Discharge: Family;Available 24 hours/day Type of Home: House Home Access: Stairs to enter [employer] of Steps: 2 (platform steps, deep enough for RW) Entrance Stairs-Rails: None Home Layout: Multi-level;Able to live on main level with bedroom/bathroom Alternate Level Stairs-Number of Steps: 15 Alternate Level Stairs-Rails: Right Bathroom Shower/Tub: Occupational psychologist: Standard Bathroom Accessibility: Yes   Home Equipment: Shower seat - built in;Walker - 2 wheels;Hand held shower head;Toilet riser;Cane - single point;Cane - quad;Grab bars - tub/shower          Prior Functioning/Environment Level of Independence: Independent                 OT Problem List: Decreased range of motion;Impaired balance (sitting and/or standing);Impaired UE functional use         OT Goals(Current goals can be found in the care plan section) Acute Rehab OT Goals Patient Stated Goal: to go home today  OT Frequency:                AM-PAC OT "6 Clicks" Daily Activity     Outcome Measure Help from another person eating meals?: A Little Help from another person taking care of personal grooming?: A Little Help from another person toileting, which includes using toliet, bedpan, or urinal?: A Little Help from another person bathing (including washing, rinsing, drying)?: A Lot Help from another person to put on and taking off regular upper body clothing?: A Little Help from another person to put on and taking off regular lower body clothing?: A Lot 6 Click Score: 16   End of Session Equipment Utilized During Treatment: Rolling walker  Activity Tolerance: Patient tolerated treatment well Patient left: in chair;with call bell/phone within reach  OT Visit Diagnosis: Unsteadiness on feet (R26.81)                Time: 3212-2482 OT Time Calculation (min): 28 min Charges:  OT General  Charges $OT Visit: 1 Visit OT Evaluation $OT Eval Moderate Complexity: 1 Mod OT Treatments $Self Care/Home Management : 8-22 mins  Golden Circle, OTR/L Acute [employer] Pager [phone] Office [phone]     Almon Register 11/12/2019, 3:52 PM

## 2019-11-15 ENCOUNTER — Encounter: Payer: Self-pay | Admitting: Family Medicine

## 2019-11-16 ENCOUNTER — Other Ambulatory Visit: Payer: Self-pay

## 2019-11-16 ENCOUNTER — Telehealth: Payer: Self-pay

## 2019-11-16 DIAGNOSIS — Z9189 Other specified personal risk factors, not elsewhere classified: Secondary | ICD-10-CM

## 2019-11-16 DIAGNOSIS — G8929 Other chronic pain: Secondary | ICD-10-CM

## 2019-11-16 DIAGNOSIS — M1711 Unilateral primary osteoarthritis, right knee: Secondary | ICD-10-CM

## 2019-11-16 NOTE — Telephone Encounter (Signed)
[name] nurse from [facility] was given verbal orders for mutual Pt [name] Home Health Pt 1 week for 9 weeks. Also nds wide bed side commode Pt would like it delivered from [facility].

## 2019-11-17 ENCOUNTER — Encounter: Payer: Self-pay | Admitting: Family Medicine

## 2019-11-18 ENCOUNTER — Telehealth: Payer: Self-pay

## 2019-11-18 NOTE — Telephone Encounter (Signed)
Izora Gala Warden/ranger from [employer] orders given for mutual Pt. OT for twice a week for 2 weeks, and once week for 6 weeks, OT ADL training.

## 2019-12-23 ENCOUNTER — Ambulatory Visit (INDEPENDENT_AMBULATORY_CARE_PROVIDER_SITE_OTHER): Payer: Medicare Other | Admitting: Family Medicine

## 2019-12-23 ENCOUNTER — Other Ambulatory Visit: Payer: Self-pay

## 2019-12-23 DIAGNOSIS — Z23 Encounter for immunization: Secondary | ICD-10-CM

## 2020-01-27 ENCOUNTER — Other Ambulatory Visit (HOSPITAL_BASED_OUTPATIENT_CLINIC_OR_DEPARTMENT_OTHER): Payer: Self-pay | Admitting: Internal Medicine

## 2020-01-27 ENCOUNTER — Ambulatory Visit: Payer: Medicare Other | Attending: Internal Medicine

## 2020-01-27 DIAGNOSIS — Z23 Encounter for immunization: Secondary | ICD-10-CM

## 2020-01-27 NOTE — Progress Notes (Signed)
   Covid-19 Vaccination Clinic  Name:  CALISE DUNCKEL    MRN: [id number] DOB: 10-Aug-1955  01/27/2020  Ms. Circle was observed post Covid-19 immunization for 15 minutes without incident. She was provided with Vaccine Information Sheet and instruction to access the V-Safe system.   Ms. Palla was instructed to call 911 with any severe reactions post vaccine: Marland Kitchen Difficulty breathing  . Swelling of face and throat  . A fast heartbeat  . A bad rash all over body  . Dizziness and weakness   Immunizations Administered    Name Date Dose VIS Date Route   Pfizer COVID-19 Vaccine 01/27/2020  2:22 PM 0.3 mL 12/28/2019 Intramuscular   Manufacturer: [place]   Lot: [id number]   [place]: [id number]

## 2020-02-19 ENCOUNTER — Emergency Department (HOSPITAL_COMMUNITY): Payer: Medicare Other

## 2020-02-19 ENCOUNTER — Inpatient Hospital Stay (HOSPITAL_COMMUNITY)
Admission: EM | Admit: 2020-02-19 | Discharge: 2020-02-22 | DRG: 394 | Disposition: A | Discharge status: Home / Self Care | Payer: Medicare Other | Attending: Internal Medicine | Admitting: Internal Medicine

## 2020-02-19 ENCOUNTER — Encounter (HOSPITAL_COMMUNITY): Payer: Self-pay

## 2020-02-19 ENCOUNTER — Other Ambulatory Visit: Payer: Self-pay

## 2020-02-19 DIAGNOSIS — Z800 Family history of malignant neoplasm of digestive organs: Secondary | ICD-10-CM

## 2020-02-19 DIAGNOSIS — M1990 Unspecified osteoarthritis, unspecified site: Secondary | ICD-10-CM | POA: Diagnosis present

## 2020-02-19 DIAGNOSIS — Z823 Family history of stroke: Secondary | ICD-10-CM

## 2020-02-19 DIAGNOSIS — K551 Chronic vascular disorders of intestine: Secondary | ICD-10-CM

## 2020-02-19 DIAGNOSIS — K633 Ulcer of intestine: Secondary | ICD-10-CM | POA: Diagnosis present

## 2020-02-19 DIAGNOSIS — Z880 Allergy status to penicillin: Secondary | ICD-10-CM

## 2020-02-19 DIAGNOSIS — Z8261 Family history of arthritis: Secondary | ICD-10-CM

## 2020-02-19 DIAGNOSIS — K219 Gastro-esophageal reflux disease without esophagitis: Secondary | ICD-10-CM | POA: Diagnosis present

## 2020-02-19 DIAGNOSIS — K559 Vascular disorder of intestine, unspecified: Principal | ICD-10-CM | POA: Diagnosis present

## 2020-02-19 DIAGNOSIS — K5792 Diverticulitis of intestine, part unspecified, without perforation or abscess without bleeding: Secondary | ICD-10-CM

## 2020-02-19 DIAGNOSIS — Z79899 Other long term (current) drug therapy: Secondary | ICD-10-CM

## 2020-02-19 DIAGNOSIS — Z8349 Family history of other endocrine, nutritional and metabolic diseases: Secondary | ICD-10-CM

## 2020-02-19 DIAGNOSIS — Z882 Allergy status to sulfonamides status: Secondary | ICD-10-CM

## 2020-02-19 DIAGNOSIS — M549 Dorsalgia, unspecified: Secondary | ICD-10-CM | POA: Diagnosis present

## 2020-02-19 DIAGNOSIS — G8929 Other chronic pain: Secondary | ICD-10-CM | POA: Diagnosis present

## 2020-02-19 DIAGNOSIS — K5900 Constipation, unspecified: Secondary | ICD-10-CM | POA: Diagnosis not present

## 2020-02-19 DIAGNOSIS — E785 Hyperlipidemia, unspecified: Secondary | ICD-10-CM | POA: Diagnosis present

## 2020-02-19 DIAGNOSIS — K529 Noninfective gastroenteritis and colitis, unspecified: Secondary | ICD-10-CM

## 2020-02-19 DIAGNOSIS — N6019 Diffuse cystic mastopathy of unspecified breast: Secondary | ICD-10-CM | POA: Diagnosis present

## 2020-02-19 DIAGNOSIS — Z20822 Contact with and (suspected) exposure to covid-19: Secondary | ICD-10-CM | POA: Diagnosis present

## 2020-02-19 DIAGNOSIS — Z96653 Presence of artificial knee joint, bilateral: Secondary | ICD-10-CM | POA: Diagnosis present

## 2020-02-19 DIAGNOSIS — Z8249 Family history of ischemic heart disease and other diseases of the circulatory system: Secondary | ICD-10-CM

## 2020-02-19 DIAGNOSIS — Z87891 Personal history of nicotine dependence: Secondary | ICD-10-CM

## 2020-02-19 DIAGNOSIS — Z9049 Acquired absence of other specified parts of digestive tract: Secondary | ICD-10-CM

## 2020-02-19 LAB — URINALYSIS, ROUTINE W REFLEX MICROSCOPIC
Bilirubin Urine: NEGATIVE
Glucose, UA: NEGATIVE mg/dL
Hgb urine dipstick: NEGATIVE
Ketones, ur: 5 mg/dL — AB
Nitrite: NEGATIVE
Protein, ur: NEGATIVE mg/dL
Specific Gravity, Urine: >1.046 — ABNORMAL HIGH (ref 1.005–1.030)
pH: 5 (ref 5.0–8.0)

## 2020-02-19 LAB — COMPREHENSIVE METABOLIC PANEL
ALT: 20 U/L (ref 0–44)
AST: 16 U/L (ref 15–41)
Albumin: 3.6 g/dL (ref 3.5–5.0)
Alkaline Phosphatase: 65 U/L (ref 38–126)
Anion gap: 13 (ref 5–15)
BUN: 14 mg/dL (ref 8–23)
CO2: 23 mmol/L (ref 22–32)
Calcium: 9.2 mg/dL (ref 8.9–10.3)
Chloride: 100 mmol/L (ref 98–111)
Creatinine, Ser: 1.09 mg/dL — ABNORMAL HIGH (ref 0.44–1.00)
GFR, Estimated: 56 mL/min — ABNORMAL LOW (ref >60)
Glucose, Bld: 138 mg/dL — ABNORMAL HIGH (ref 70–99)
Potassium: 4.5 mmol/L (ref 3.5–5.1)
Sodium: 136 mmol/L (ref 135–145)
Total Bilirubin: 0.9 mg/dL (ref 0.3–1.2)
Total Protein: 6.9 g/dL (ref 6.5–8.1)

## 2020-02-19 LAB — CBC
HCT: 40.3 % (ref 36.0–46.0)
Hemoglobin: 13.4 g/dL (ref 12.0–15.0)
MCH: 29.5 pg (ref 26.0–34.0)
MCHC: 33.3 g/dL (ref 30.0–36.0)
MCV: 88.6 fL (ref 80.0–100.0)
Platelets: 288 10*3/uL (ref 150–400)
RBC: 4.55 MIL/uL (ref 3.87–5.11)
RDW: 14.2 % (ref 11.5–15.5)
WBC: 10.2 10*3/uL (ref 4.0–10.5)
nRBC: 0 % (ref 0.0–0.2)

## 2020-02-19 LAB — RESP PANEL BY RT-PCR (FLU A&B, COVID) ARPGX2
Influenza A by PCR: NEGATIVE
Influenza B by PCR: NEGATIVE
SARS Coronavirus 2 by RT PCR: NEGATIVE

## 2020-02-19 LAB — LIPASE, BLOOD: Lipase: 25 U/L (ref 11–51)

## 2020-02-19 MED ORDER — POLYETHYLENE GLYCOL 3350 17 G PO PACK: 17.0000 g | PACK | Freq: Every day | ORAL | Status: DC | PRN

## 2020-02-19 MED ORDER — CITALOPRAM HYDROBROMIDE 20 MG PO TABS
20.0000 mg | ORAL_TABLET | Freq: Every day | ORAL | Status: DC
  Administered 2020-02-19 – 2020-02-21 (×3): 20 mg via ORAL
  Filled 2020-02-19: qty 1
  Filled 2020-02-19: qty 2
  Filled 2020-02-19: qty 1

## 2020-02-19 MED ORDER — FLUTICASONE PROPIONATE 50 MCG/ACT NA SUSP
2.0000 | Freq: Every day | NASAL | Status: DC | PRN
  Filled 2020-02-19: qty 16

## 2020-02-19 MED ORDER — ACETAMINOPHEN 325 MG PO TABS
650.0000 mg | ORAL_TABLET | Freq: Four times a day (QID) | ORAL | Status: DC | PRN
  Administered 2020-02-20 – 2020-02-21 (×2): 650 mg via ORAL
  Filled 2020-02-19 (×2): qty 2

## 2020-02-19 MED ORDER — HYDROMORPHONE HCL 1 MG/ML IJ SOLN: 0.5000 mg | INTRAMUSCULAR | Status: DC | PRN

## 2020-02-19 MED ORDER — BISACODYL 10 MG RE SUPP
10.0000 mg | Freq: Once | RECTAL | Status: DC
  Filled 2020-02-19: qty 1

## 2020-02-19 MED ORDER — ONDANSETRON HCL 4 MG/2ML IJ SOLN
4.0000 mg | Freq: Once | INTRAMUSCULAR | Status: AC
  Administered 2020-02-19: 4 mg via INTRAVENOUS
  Filled 2020-02-19: qty 2

## 2020-02-19 MED ORDER — NAPROXEN SODIUM 275 MG PO TABS
275.0000 mg | ORAL_TABLET | Freq: Two times a day (BID) | ORAL | Status: DC | PRN
  Filled 2020-02-19: qty 1

## 2020-02-19 MED ORDER — SENNA 8.6 MG PO TABS: 1.0000 | ORAL_TABLET | Freq: Two times a day (BID) | ORAL | Status: DC

## 2020-02-19 MED ORDER — ONDANSETRON HCL 4 MG PO TABS: 4.0000 mg | ORAL_TABLET | Freq: Four times a day (QID) | ORAL | Status: DC | PRN

## 2020-02-19 MED ORDER — VITAMIN D 25 MCG (1000 UNIT) PO TABS
1000.0000 [IU] | ORAL_TABLET | Freq: Every day | ORAL | Status: DC
  Administered 2020-02-20 – 2020-02-22 (×3): 1000 [IU] via ORAL
  Filled 2020-02-19 (×3): qty 1

## 2020-02-19 MED ORDER — METRONIDAZOLE 500 MG PO TABS
500.0000 mg | ORAL_TABLET | Freq: Three times a day (TID) | ORAL | Status: DC
  Administered 2020-02-19 – 2020-02-21 (×5): 500 mg via ORAL
  Filled 2020-02-19 (×5): qty 1

## 2020-02-19 MED ORDER — SENNOSIDES-DOCUSATE SODIUM 8.6-50 MG PO TABS
1.0000 | ORAL_TABLET | Freq: Two times a day (BID) | ORAL | Status: DC
  Administered 2020-02-19: 1 via ORAL
  Filled 2020-02-19: qty 1

## 2020-02-19 MED ORDER — FISH OIL 1000 MG PO CAPS: 1000.0000 mg | ORAL_CAPSULE | Freq: Every day | ORAL | Status: DC

## 2020-02-19 MED ORDER — DOCUSATE SODIUM 100 MG PO CAPS: 100.0000 mg | ORAL_CAPSULE | Freq: Two times a day (BID) | ORAL | Status: DC

## 2020-02-19 MED ORDER — FENTANYL CITRATE (PF) 100 MCG/2ML IJ SOLN: 50.0000 ug | INTRAMUSCULAR | Status: DC | PRN

## 2020-02-19 MED ORDER — ATORVASTATIN CALCIUM 40 MG PO TABS
40.0000 mg | ORAL_TABLET | Freq: Every day | ORAL | Status: DC
  Administered 2020-02-19 – 2020-02-21 (×3): 40 mg via ORAL
  Filled 2020-02-19 (×2): qty 1
  Filled 2020-02-19: qty 4

## 2020-02-19 MED ORDER — SODIUM CHLORIDE 0.9 % IV SOLN
2.0000 g | Freq: Every day | INTRAVENOUS | Status: DC
  Administered 2020-02-19 – 2020-02-20 (×2): 2 g via INTRAVENOUS
  Filled 2020-02-19: qty 20
  Filled 2020-02-19: qty 2
  Filled 2020-02-19: qty 20

## 2020-02-19 MED ORDER — LACTATED RINGERS IV SOLN: INTRAVENOUS | Status: DC

## 2020-02-19 MED ORDER — FENTANYL CITRATE (PF) 100 MCG/2ML IJ SOLN
50.0000 ug | Freq: Once | INTRAMUSCULAR | Status: AC
  Administered 2020-02-19: 50 ug via INTRAVENOUS
  Filled 2020-02-19: qty 2

## 2020-02-19 MED ORDER — DOCUSATE SODIUM 100 MG PO CAPS
100.0000 mg | ORAL_CAPSULE | Freq: Every morning | ORAL | Status: DC
Start: 2020-02-20 — End: 2020-02-19

## 2020-02-19 MED ORDER — SODIUM CHLORIDE 0.9 % IV BOLUS
500.0000 mL | Freq: Once | INTRAVENOUS | Status: AC
  Administered 2020-02-19: 500 mL via INTRAVENOUS

## 2020-02-19 MED ORDER — ONDANSETRON HCL 4 MG/2ML IJ SOLN
4.0000 mg | Freq: Four times a day (QID) | INTRAMUSCULAR | Status: DC | PRN
  Administered 2020-02-20: 4 mg via INTRAVENOUS
  Filled 2020-02-19: qty 2

## 2020-02-19 MED ORDER — IOHEXOL 300 MG/ML  SOLN
100.0000 mL | Freq: Once | INTRAMUSCULAR | Status: AC | PRN
  Administered 2020-02-19: 100 mL via INTRAVENOUS

## 2020-02-19 MED ORDER — PANTOPRAZOLE SODIUM 40 MG PO TBEC
40.0000 mg | DELAYED_RELEASE_TABLET | Freq: Every day | ORAL | Status: DC
  Administered 2020-02-20 – 2020-02-22 (×3): 40 mg via ORAL
  Filled 2020-02-19 (×3): qty 1

## 2020-02-19 MED ORDER — HYDROMORPHONE HCL 1 MG/ML IJ SOLN
0.5000 mg | Freq: Once | INTRAMUSCULAR | Status: AC
  Administered 2020-02-19: 0.5 mg via INTRAVENOUS
  Filled 2020-02-19: qty 1

## 2020-02-19 MED ORDER — ENOXAPARIN SODIUM 40 MG/0.4ML ~~LOC~~ SOLN
40.0000 mg | Freq: Every day | SUBCUTANEOUS | Status: DC
  Administered 2020-02-19: 40 mg via SUBCUTANEOUS
  Filled 2020-02-19: qty 0.4

## 2020-02-19 MED ORDER — OMEGA-3-ACID ETHYL ESTERS 1 G PO CAPS
1.0000 g | ORAL_CAPSULE | Freq: Every day | ORAL | Status: DC
  Administered 2020-02-20 – 2020-02-22 (×3): 1 g via ORAL
  Filled 2020-02-19 (×3): qty 1

## 2020-02-19 MED ORDER — ACETAMINOPHEN 650 MG RE SUPP: 650.0000 mg | Freq: Four times a day (QID) | RECTAL | Status: DC | PRN

## 2020-02-19 MED ORDER — LORATADINE 10 MG PO TABS
10.0000 mg | ORAL_TABLET | Freq: Every day | ORAL | Status: DC
  Administered 2020-02-19 – 2020-02-21 (×3): 10 mg via ORAL
  Filled 2020-02-19 (×3): qty 1

## 2020-02-19 MED ORDER — CIPROFLOXACIN IN D5W 400 MG/200ML IV SOLN
400.0000 mg | Freq: Once | INTRAVENOUS | Status: AC
  Administered 2020-02-19: 400 mg via INTRAVENOUS
  Filled 2020-02-19: qty 200

## 2020-02-19 MED ORDER — MELATONIN 5 MG PO TABS
10.0000 mg | ORAL_TABLET | Freq: Every day | ORAL | Status: DC
  Administered 2020-02-19 – 2020-02-21 (×3): 10 mg via ORAL
  Filled 2020-02-19 (×3): qty 2

## 2020-02-19 NOTE — ED Triage Notes (Signed)
Patient arrived by [facility] for abdominal pain and cramping with nausea that started on Friday. EMS reports that patient received 300NS prior to arrival. Patient reports that her last BM was Tuesday and has taken multiple laxatives. Alert and oriented

## 2020-02-19 NOTE — H&P (Signed)
History and Physical    Monique Gonzalez TTS:[id number] DOB: 06-28-1955 DOA: 02/19/2020  PCP: Susy Frizzle, MD  Patient coming from: Home  I have personally briefly reviewed patient's old medical records in [place]  Chief Complaint: Abd pain  HPI: Monique Gonzalez is a 64 y.o. female with medical history significant of obesity, bowel stricture and partial colectomy.  Pt presents to the ED with c/o abd pain, nausea, constipation.  Symptoms onset Friday, worsening throughout day yesterday and today.  Last BM on Tuesday.  Tried taking multiple laxatives without results, still passing gas.  Pain is cramping and aching and worse on left side.  No fevers, chills.   ED Course: CT abd pelvis: 1) large stool burden throughout colon 2) sigmoid colitis - diverticulitis vs infectious vs inflammatory, seems more diffuse than diverticulitis would be though according to radiologist report.  Started on cipro+flagyl.   Review of Systems: As per HPI, otherwise all review of systems negative.  Past Medical History:  Diagnosis Date  . Allergy   . Chronic pain    back and knee  . Colitis 09/2012   infectious vs inflammatory.   . Colon stricture ([place])   . Depression   . Hyperlipidemia   . Obesity   . Osteoarthritis   . Osteopenia   . Recurrent cold sores   . Seasonal allergies   . Syncope and collapse 11/30/2012   Normal EEG.  Vaso vagal syncope    Past Surgical History:  Procedure Laterality Date  . COLON RESECTION N/A 10/14/2013   Procedure: LAPAROSCOPIC MOBILIZATION OF SPLENIC FLEXURE, LAPAROSCOPIC EXTENDED LEFT COLECTOMY;  Surgeon: [name], MD;  Location: [place];  Service: General;  Laterality: N/A;  . COLONOSCOPY N/A 10/12/2013   Procedure: COLONOSCOPY;  Surgeon: [name], MD;  Location: [facility] ENDOSCOPY;  Service: Endoscopy;  Laterality: N/A;  . DILATION AND CURETTAGE, DIAGNOSTIC / THERAPEUTIC  1987  . OPEN REDUCTION INTERNAL FIXATION (ORIF) DISTAL RADIAL FRACTURE Right  08/08/2014   Procedure: OPEN REDUCTION INTERNAL FIXATION (ORIF) DISTAL RADIAL FRACTURE;  Surgeon: Roseanne Kaufman, MD;  Location: [place];  Service: Orthopedics;  Laterality: Right;  DVR Crosslock, MD available sometime around 1430-1500  . ORIF ANKLE FRACTURE Right 11/10/2019   Procedure: POSSIBLEOPEN REDUCTION INTERNAL FIXATION (ORIF) ANKLE FRACTURE;  Surgeon: Shona Needles, MD;  Location: [place];  Service: Orthopedics;  Laterality: Right;  . ORIF WRIST FRACTURE Left 11/10/2019   Procedure: OPEN REDUCTION INTERNAL FIXATION (ORIF) WRIST FRACTURE;  Surgeon: Shona Needles, MD;  Location: [place];  Service: Orthopedics;  Laterality: Left;  . TONSILLECTOMY    . TOTAL KNEE ARTHROPLASTY Right 08/05/2016  . TOTAL KNEE ARTHROPLASTY Right 08/05/2016   Procedure: RIGHT TOTAL KNEE ARTHROPLASTY;  Surgeon: Mcarthur Rossetti, MD;  Location: [place];  Service: Orthopedics;  Laterality: Right;     reports that she quit smoking about 26 years ago. She has never used smokeless tobacco. She reports current alcohol use. She reports that she does not use drugs.  Allergies  Allergen Reactions  . Penicillins Rash and Other (See Comments)    Has patient had a PCN reaction causing immediate rash, facial/tongue/throat swelling, SOB or lightheadedness with hypotension: Yes Has patient had a PCN reaction causing severe rash involving mucus membranes or skin necrosis: Unknown Has patient had a PCN reaction that required hospitalization: No Has patient had a PCN reaction occurring within the last 10 years: No If all of the above answers are "NO", then may proceed  with Cephalosporin use. Childhood reaction.  . Sulfa Antibiotics Rash    Family History  Problem Relation Age of Onset  . Stroke Mother   . Arthritis Mother   . Thyroid disease Mother   . Cancer Father   . Arthritis Sister   . Thyroid disease Sister   . Cancer Maternal Grandmother   . Stroke Maternal Grandfather   . Cancer Maternal Grandfather   . Colon  cancer Maternal Grandfather   . Cancer Paternal Grandmother   . Heart disease Paternal Grandmother   . Colon cancer Paternal Grandmother   . Stroke Paternal Grandfather   . Arthritis Sister   . Obesity Sister   . Sudden death Neg Hx   . Hypertension Neg Hx   . Hyperlipidemia Neg Hx   . Heart attack Neg Hx   . Diabetes Neg Hx   . Rectal cancer Neg Hx   . Stomach cancer Neg Hx   . Esophageal cancer Neg Hx      Prior to Admission medications   Medication Sig Start Date End Date Taking? Authorizing Provider  acetaminophen (TYLENOL) 650 MG CR tablet Take 1,300 mg by mouth at bedtime.   Yes [provider]  atorvastatin (LIPITOR) 40 MG tablet Take 1 tablet (40 mg total) by mouth daily. Patient taking differently: Take 40 mg by mouth at bedtime. 06/15/19  Yes Susy Frizzle, MD  Calcium Carb-Cholecalciferol (CALCIUM 600+D3 PO) Take 1 tablet by mouth in the morning and at bedtime.   Yes [provider]  Cholecalciferol (VITAMIN D-3) 25 MCG (1000 UT) CAPS Take 1,000 Units by mouth daily with breakfast.   Yes [provider]  citalopram (CELEXA) 20 MG tablet Take 1 tablet (20 mg total) by mouth at bedtime. 06/15/19  Yes Susy Frizzle, MD  cyclobenzaprine (FLEXERIL) 10 MG tablet Take 1 tablet (10 mg total) by mouth 3 (three) times daily as needed for muscle spasms. Patient taking differently: Take 10 mg by mouth See admin instructions. Take 10 mg by mouth at bedtime and an additional 10 mg two times a day as needed for muscle spasms 06/15/19  Yes Susy Frizzle, MD  docusate sodium (COLACE) 100 MG capsule Take 100 mg by mouth in the morning.   Yes [provider]  fluticasone (FLONASE) 50 MCG/ACT nasal spray Place 2 sprays into both nostrils daily as needed (for seasonal allergies).   Yes [provider]  loratadine (CLARITIN) 10 MG tablet Take 10 mg by mouth at bedtime.   Yes [provider]  Melatonin 10 MG TABS Take 10 mg by mouth at  bedtime.   Yes [provider]  Multiple Vitamins-Minerals (ONE-A-DAY WOMENS PO) Take 1 tablet by mouth daily with breakfast.   Yes [provider]  naproxen sodium (ALEVE) 220 MG tablet Take 220 mg by mouth 2 (two) times daily as needed (for pain).   Yes [provider]  Omega-3 Fatty Acids (FISH OIL) 1000 MG CAPS Take 1,000 mg by mouth daily after breakfast.   Yes [provider]  pantoprazole (PROTONIX) 40 MG tablet Take 1 tablet (40 mg total) by mouth daily. Patient taking differently: Take 40 mg by mouth daily before breakfast. 06/15/19  Yes Susy Frizzle, MD    Physical Exam: Vitals:   02/19/20 1720 02/19/20 1745 02/19/20 1853 02/19/20 2012  BP: (!) 153/73 (!) 155/68 136/79 (!) 148/80  Pulse: 88 91 87 97  Resp: 16 14 11 15   Temp:    98.9 F (  37.2 C)  TempSrc:    Oral  SpO2: 98% 95% 100% 98%    Constitutional: NAD, calm, comfortable Eyes: PERRL, lids and conjunctivae normal ENMT: Mucous membranes are moist. Posterior pharynx clear of any exudate or lesions.Normal dentition.  Neck: normal, supple, no masses, no thyromegaly Respiratory: clear to auscultation bilaterally, no wheezing, no crackles. Normal respiratory effort. No accessory muscle use.  Cardiovascular: Regular rate and rhythm, no murmurs / rubs / gallops. No extremity edema. 2+ pedal pulses. No carotid bruits.  Abdomen: TTP L abdomen, no guarding, no rebound Musculoskeletal: no clubbing / cyanosis. No joint deformity upper and lower extremities. Good ROM, no contractures. Normal muscle tone.  Skin: no rashes, lesions, ulcers. No induration Neurologic: CN 2-12 grossly intact. Sensation intact, DTR normal. Strength 5/5 in all 4.  Psychiatric: Normal judgment and insight. Alert and oriented x 3. Normal mood.    Labs on Admission: I have personally reviewed following labs and imaging studies  CBC: Recent Labs  Lab 02/19/20 1104  WBC 10.2  HGB 13.4  HCT 40.3  MCV 88.6  PLT 010    Basic Metabolic Panel: Recent Labs  Lab 02/19/20 1104  NA 136  K 4.5  CL 100  CO2 23  GLUCOSE 138*  BUN 14  CREATININE 1.09*  CALCIUM 9.2   GFR: CrCl cannot be calculated (Unknown ideal weight.). Liver Function Tests: Recent Labs  Lab 02/19/20 1104  AST 16  ALT 20  ALKPHOS 65  BILITOT 0.9  PROT 6.9  ALBUMIN 3.6   Recent Labs  Lab 02/19/20 1104  LIPASE 25   No results for input(s): AMMONIA in the last 168 hours. Coagulation Profile: No results for input(s): INR, PROTIME in the last 168 hours. Cardiac Enzymes: No results for input(s): CKTOTAL, CKMB, CKMBINDEX, TROPONINI in the last 168 hours. BNP (last 3 results) No results for input(s): PROBNP in the last 8760 hours. HbA1C: No results for input(s): HGBA1C in the last 72 hours. CBG: No results for input(s): GLUCAP in the last 168 hours. Lipid Profile: No results for input(s): CHOL, HDL, LDLCALC, TRIG, CHOLHDL, LDLDIRECT in the last 72 hours. Thyroid Function Tests: No results for input(s): TSH, T4TOTAL, FREET4, T3FREE, THYROIDAB in the last 72 hours. Anemia Panel: No results for input(s): VITAMINB12, FOLATE, FERRITIN, TIBC, IRON, RETICCTPCT in the last 72 hours. Urine analysis:    Component Value Date/Time   COLORURINE AMBER (A) 02/19/2020 1930   APPEARANCEUR HAZY (A) 02/19/2020 1930   LABSPEC >1.046 (H) 02/19/2020 1930   PHURINE 5.0 02/19/2020 1930   GLUCOSEU NEGATIVE 02/19/2020 1930   HGBUR NEGATIVE 02/19/2020 1930   [place] NEGATIVE 02/19/2020 1930   KETONESUR 5 (A) 02/19/2020 1930   PROTEINUR NEGATIVE 02/19/2020 1930   UROBILINOGEN 0.2 10/14/2013 1835   NITRITE NEGATIVE 02/19/2020 1930   LEUKOCYTESUR TRACE (A) 02/19/2020 1930    Radiological Exams on Admission: CT ABDOMEN PELVIS W CONTRAST  Result Date: 02/19/2020 CLINICAL DATA:  Left lower quadrant pain EXAM: CT ABDOMEN AND PELVIS WITH CONTRAST TECHNIQUE: Multidetector CT imaging of the abdomen and pelvis was performed using the standard  protocol following bolus administration of intravenous contrast. CONTRAST:  148mL OMNIPAQUE IOHEXOL 300 MG/ML  SOLN COMPARISON:  01/14/2017 FINDINGS: Lower chest: Linear atelectasis in the lung bases.  No effusions. Hepatobiliary: No focal hepatic abnormality. Gallbladder unremarkable. Pancreas: No focal abnormality or ductal dilatation. Spleen: No focal abnormality.  Normal size. Adrenals/Urinary Tract: Left adrenal nodule measures 1.8 cm. Right adrenal gland unremarkable. Kidneys and urinary bladder unremarkable. Stomach/Bowel: Large stool  burden throughout the colon. Postoperative changes in the distal descending colon. There is inflammation around the sigmoid colon. There are a few scattered diverticula. Inflammation appears more diffuse than diverticulitis, possibly colitis. Stomach and small bowel decompressed, unremarkable. Vascular/Lymphatic: No evidence of aneurysm or adenopathy. Reproductive: Insert female pelvis Other: No free fluid or free air. Musculoskeletal: No acute bony abnormality. IMPRESSION: Large stool burden throughout the colon. There is inflammation/stranding around the sigmoid colon which appears rather diffuse. This could reflect changes of diverticulitis or infectious/inflammatory colitis. Small nodule in the left adrenal gland, likely adenoma. Electronically Signed   By: Rolm Baptise M.D.   On: 02/19/2020 19:07    EKG: Independently reviewed.  Assessment/Plan Principal Problem:   Colitis Active Problems:   Constipation    1. Colitis - 1. Empiric cipro/flagyl for now 2. Dilaudid PRN pain 3. zofran PRN nausea 2. Constipation - 1. Scheduled BID Senna-docusate 2. Dulcolax supp x1 now 3. If no BM by tomorrow then may need to step it up to mag-citrate. 4. IVF: LR at 150  DVT prophylaxis: Lovenox Code Status: Full Family Communication: Family at bedside Disposition Plan: Home after pain controlled and has BM Consults called: None Admission status: Place in  obs     Sholonda Jobst, [place] [facility]  How to contact the [facility] Attending or Consulting provider [place] or covering provider during after hours [place], for this patient?  1. Check the care team in [facility] and look for a) attending/consulting [facility] provider listed and b) the [facility] team listed 2. Log into [email or web address]  Amion Physician Scheduling and messaging for groups and whole hospitals  On call and physician scheduling software for group practices, residents, hospitalists and other medical providers for call, clinic, rotation and shift schedules. On[employer] is a hospital-wide system for scheduling doctors and paging doctors on call. EasyPlot is for scientific plotting and data analysis.  [email or web address]  and use [place]'s universal password to access. If you do not have the password, please contact the hospital operator.  3. Locate the [facility] provider you are looking for under [facility] and page to a number that you can be directly reached. 4. If you still have difficulty reaching the provider, please page the [facility] (Director on Call) for the Hospitalists listed on amion for assistance.  02/19/2020, 8:23 PM

## 2020-02-19 NOTE — ED Notes (Signed)
Pt transitioned into a Hospital Bed for comfort. Pt comfortable at this time with no complaints. Call bell within reach.

## 2020-02-19 NOTE — ED Provider Notes (Signed)
[facility] EMERGENCY DEPARTMENT Provider Note   CSN: [id number] Arrival date & time: 02/19/20  1041     History No chief complaint on file.   Monique Gonzalez is a 64 y.o. female.  Presents to ER with concern for abdominal pain, nausea, constipation.  Symptoms started Friday, worsening throughout the day yesterday and today.  Last bowel movement was on Tuesday, has tried taking multiple laxatives without results.  Still passing gas.  Pain is cramping, aching, worse on left side.  Patient has history of bowel stricture and partial colectomy.  HPI     Past Medical History:  Diagnosis Date   Allergy    Chronic pain    back and knee   Colitis 09/2012   infectious vs inflammatory.    Colon stricture (HCC)    Depression    Hyperlipidemia    Obesity    Osteoarthritis    Osteopenia    Recurrent cold sores    Seasonal allergies    Syncope and collapse 11/30/2012   Normal EEG.  Vaso vagal syncope    Patient Active Problem List   Diagnosis Date Noted   Closed fracture of left distal radius 11/10/2019   Fall 11/10/2019   Closed right ankle fracture 11/09/2019   Ankle fracture 11/09/2019   Closed displaced fracture of styloid process of left ulna    Anxiety and depression 12/22/2016   Insulin resistance 10/15/2016   Presence of right artificial knee joint 10/14/2016   Unilateral primary osteoarthritis, right knee 05/20/2016   Class 2 obesity without serious comorbidity with body mass index (BMI) of 37.0 to 37.9 in adult 04/08/2016   Vitamin D deficiency 04/08/2016   Shortness of breath 03/25/2016   Constipation 09/20/2013   Recurrent cold sores    Right knee pain 11/23/2011   Osteoarthritis    Depression    Hyperlipidemia     Past Surgical History:  Procedure Laterality Date   COLON RESECTION N/A 10/14/2013   Procedure: LAPAROSCOPIC MOBILIZATION OF SPLENIC FLEXURE, LAPAROSCOPIC EXTENDED LEFT COLECTOMY;  Surgeon: [name], MD;  Location: [facility];  Service: General;  Laterality: N/A;   COLONOSCOPY N/A 10/12/2013   Procedure: COLONOSCOPY;  Surgeon: [name], MD;  Location: [facility];  Service: Endoscopy;  Laterality: N/A;   DILATION AND CURETTAGE, DIAGNOSTIC / THERAPEUTIC  1987   OPEN REDUCTION INTERNAL FIXATION (ORIF) DISTAL RADIAL FRACTURE Right 08/08/2014   Procedure: OPEN REDUCTION INTERNAL FIXATION (ORIF) DISTAL RADIAL FRACTURE;  Surgeon: Roseanne Kaufman, MD;  Location: [facility];  Service: Orthopedics;  Laterality: Right;  DVR Crosslock, MD available sometime around 1430-1500   ORIF ANKLE FRACTURE Right 11/10/2019   Procedure: POSSIBLEOPEN REDUCTION INTERNAL FIXATION (ORIF) ANKLE FRACTURE;  Surgeon: Shona Needles, MD;  Location: [facility];  Service: Orthopedics;  Laterality: Right;   ORIF WRIST FRACTURE Left 11/10/2019   Procedure: OPEN REDUCTION INTERNAL FIXATION (ORIF) WRIST FRACTURE;  Surgeon: Shona Needles, MD;  Location: [facility];  Service: Orthopedics;  Laterality: Left;   TONSILLECTOMY     TOTAL KNEE ARTHROPLASTY Right 08/05/2016   TOTAL KNEE ARTHROPLASTY Right 08/05/2016   Procedure: RIGHT TOTAL KNEE ARTHROPLASTY;  Surgeon: Mcarthur Rossetti, MD;  Location: [facility];  Service: Orthopedics;  Laterality: Right;     OB History    Gravida  4   Para  2   Term  2   Preterm      AB  2   Living        SAB  2   IAB      Ectopic      Multiple      Live Births              Family History  Problem Relation Age of Onset   Stroke Mother    Arthritis Mother    Thyroid disease Mother    Cancer Father    Arthritis Sister    Thyroid disease Sister    Cancer Maternal Grandmother    Stroke Maternal Grandfather    Cancer Maternal Grandfather    Colon cancer Maternal Grandfather    Cancer Paternal Grandmother    Heart disease Paternal Grandmother    Colon cancer Paternal Grandmother    Stroke Paternal Grandfather    Arthritis Sister    Obesity Sister     Sudden death Neg Hx    Hypertension Neg Hx    Hyperlipidemia Neg Hx    Heart attack Neg Hx    Diabetes Neg Hx    Rectal cancer Neg Hx    Stomach cancer Neg Hx    Esophageal cancer Neg Hx     Social History   Tobacco Use   Smoking status: Former Smoker    Quit date: 10/27/1993    Years since quitting: 26.3   Smokeless tobacco: Never Used  Vaping Use   Vaping Use: Never used  Substance Use Topics   Alcohol use: Yes    Comment: one drink per week   Drug use: No    Home Medications Prior to Admission medications   Medication Sig Start Date End Date Taking? Authorizing Provider  acetaminophen (TYLENOL) 650 MG CR tablet Take 1,300 mg by mouth at bedtime.   Yes [provider]  atorvastatin (LIPITOR) 40 MG tablet Take 1 tablet (40 mg total) by mouth daily. Patient taking differently: Take 40 mg by mouth at bedtime. 06/15/19  Yes Susy Frizzle, MD  Calcium Carb-Cholecalciferol (CALCIUM 600+D3 PO) Take 1 tablet by mouth in the morning and at bedtime.   Yes [provider]  Cholecalciferol (VITAMIN D-3) 25 MCG (1000 UT) CAPS Take 1,000 Units by mouth daily with breakfast.   Yes [provider]  citalopram (CELEXA) 20 MG tablet Take 1 tablet (20 mg total) by mouth at bedtime. 06/15/19  Yes Susy Frizzle, MD  cyclobenzaprine (FLEXERIL) 10 MG tablet Take 1 tablet (10 mg total) by mouth 3 (three) times daily as needed for muscle spasms. Patient taking differently: Take 10 mg by mouth See admin instructions. Take 10 mg by mouth at bedtime and an additional 10 mg two times a day as needed for muscle spasms 06/15/19  Yes Susy Frizzle, MD  docusate sodium (COLACE) 100 MG capsule Take 100 mg by mouth in the morning.   Yes [provider]  fluticasone (FLONASE) 50 MCG/ACT nasal spray Place 2 sprays into both nostrils daily as needed (for seasonal allergies).   Yes [provider]  loratadine (CLARITIN) 10 MG tablet Take 10 mg by mouth  at bedtime.   Yes [provider]  Melatonin 10 MG TABS Take 10 mg by mouth at bedtime.   Yes [provider]  Multiple Vitamins-Minerals (ONE-A-DAY WOMENS PO) Take 1 tablet by mouth daily with breakfast.   Yes [provider]  naproxen sodium (ALEVE) 220 MG tablet Take 220 mg by mouth 2 (two) times daily as needed (for pain).   Yes [provider]  Omega-3 Fatty Acids (FISH OIL) 1000 MG CAPS Take 1,000  mg by mouth daily after breakfast.   Yes [provider]  pantoprazole (PROTONIX) 40 MG tablet Take 1 tablet (40 mg total) by mouth daily. Patient taking differently: Take 40 mg by mouth daily before breakfast. 06/15/19  Yes Pickard, Cammie Mcgee, MD  HYDROcodone-acetaminophen (NORCO/VICODIN) 5-325 MG tablet Take 1-2 tablets by mouth every 6 (six) hours as needed for moderate pain or severe pain (1 tablet moderate pain, 2 tablets severe pain). Patient not taking: Reported on 02/19/2020 11/11/19   Delray Alt, PA-C  triamcinolone cream (KENALOG) 0.1 % Apply 1 application topically 2 (two) times daily. Patient not taking: Reported on 02/19/2020 03/14/19   Susy Frizzle, MD    Allergies    Penicillins and Sulfa antibiotics  Review of Systems   Review of Systems  Constitutional: Negative for chills and fever.  HENT: Negative for ear pain and sore throat.   Eyes: Negative for pain and visual disturbance.  Respiratory: Negative for cough and shortness of breath.   Cardiovascular: Negative for chest pain and palpitations.  Gastrointestinal: Positive for abdominal pain and nausea. Negative for vomiting.  Genitourinary: Negative for dysuria and hematuria.  Musculoskeletal: Negative for arthralgias and back pain.  Skin: Negative for color change and rash.  Neurological: Negative for seizures and syncope.  All other systems reviewed and are negative.   Physical Exam Updated Vital Signs BP 136/79 (BP Location: Right Arm)    Pulse 87    Temp 98.3 F (36.8  C) (Oral)    Resp 11    SpO2 100%   Physical Exam Vitals and nursing note reviewed.  Constitutional:      General: She is not in acute distress.    Appearance: She is well-developed and well-nourished.  HENT:     Head: Normocephalic and atraumatic.  Eyes:     Conjunctiva/sclera: Conjunctivae normal.  Cardiovascular:     Rate and Rhythm: Normal rate and regular rhythm.     Heart sounds: No murmur heard.   Pulmonary:     Effort: Pulmonary effort is normal. No respiratory distress.     Breath sounds: Normal breath sounds.  Abdominal:     Palpations: Abdomen is soft.     Comments: TTP over left side of abd, no rebound or guarding  Musculoskeletal:        General: No deformity, signs of injury or edema.     Cervical back: Neck supple.  Skin:    General: Skin is warm and dry.  Neurological:     General: No focal deficit present.     Mental Status: She is alert.  Psychiatric:        Mood and Affect: Mood and affect and mood normal.        Behavior: Behavior normal.     ED Results / Procedures / Treatments   Labs (all labs ordered are listed, but only abnormal results are displayed) Labs Reviewed  COMPREHENSIVE METABOLIC PANEL - Abnormal; Notable for the following components:      Result Value   Glucose, Bld 138 (*)    Creatinine, Ser 1.09 (*)    GFR, Estimated 56 (*)    All other components within normal limits  RESP PANEL BY RT-PCR (FLU A&B, COVID) ARPGX2  LIPASE, BLOOD  CBC  URINALYSIS, ROUTINE W REFLEX MICROSCOPIC    EKG EKG Interpretation  Date/Time:  Sunday February 19 2020 10:59:40 EST Ventricular Rate:  103 PR Interval:  160 QRS Duration: 80 QT Interval:  338 QTC Calculation: 442 R  Axis:   30 Text Interpretation: Sinus tachycardia Otherwise normal ECG Confirmed by Madalyn Rob [phone]) on 02/19/2020 3:05:05 PM   Radiology CT ABDOMEN PELVIS W CONTRAST  Result Date: 02/19/2020 CLINICAL DATA:  Left lower quadrant pain EXAM: CT ABDOMEN AND PELVIS  WITH CONTRAST TECHNIQUE: Multidetector CT imaging of the abdomen and pelvis was performed using the standard protocol following bolus administration of intravenous contrast. CONTRAST:  122mL OMNIPAQUE IOHEXOL 300 MG/ML  SOLN COMPARISON:  01/14/2017 FINDINGS: Lower chest: Linear atelectasis in the lung bases.  No effusions. Hepatobiliary: No focal hepatic abnormality. Gallbladder unremarkable. Pancreas: No focal abnormality or ductal dilatation. Spleen: No focal abnormality.  Normal size. Adrenals/Urinary Tract: Left adrenal nodule measures 1.8 cm. Right adrenal gland unremarkable. Kidneys and urinary bladder unremarkable. Stomach/Bowel: Large stool burden throughout the colon. Postoperative changes in the distal descending colon. There is inflammation around the sigmoid colon. There are a few scattered diverticula. Inflammation appears more diffuse than diverticulitis, possibly colitis. Stomach and small bowel decompressed, unremarkable. Vascular/Lymphatic: No evidence of aneurysm or adenopathy. Reproductive: Insert female pelvis Other: No free fluid or free air. Musculoskeletal: No acute bony abnormality. IMPRESSION: Large stool burden throughout the colon. There is inflammation/stranding around the sigmoid colon which appears rather diffuse. This could reflect changes of diverticulitis or infectious/inflammatory colitis. Small nodule in the left adrenal gland, likely adenoma. Electronically Signed   By: Rolm Baptise M.D.   On: 02/19/2020 19:07    Procedures Procedures (including critical care time)  Medications Ordered in ED Medications  ciprofloxacin (CIPRO) IVPB 400 mg (has no administration in time range)    And  metroNIDAZOLE (FLAGYL) tablet 500 mg (has no administration in time range)  fentaNYL (SUBLIMAZE) injection 50 mcg (has no administration in time range)  fentaNYL (SUBLIMAZE) injection 50 mcg (50 mcg Intravenous Given 02/19/20 1631)  ondansetron (ZOFRAN) injection 4 mg (4 mg Intravenous  Given 02/19/20 1631)  sodium chloride 0.9 % bolus 500 mL (0 mLs Intravenous Stopped 02/19/20 1720)  iohexol (OMNIPAQUE) 300 MG/ML solution 100 mL (100 mLs Intravenous Contrast Given 02/19/20 1756)  HYDROmorphone (DILAUDID) injection 0.5 mg (0.5 mg Intravenous Given 02/19/20 1852)    ED Course  I have reviewed the triage vital signs and the nursing notes.  Pertinent labs & imaging results that were available during my care of the patient were reviewed by me and considered in my medical decision making (see chart for details).    MDM Rules/Calculators/A&P                         64 year old lady presents to ER with concern for left lower quadrant abdominal pain as well as constipation.  On exam patient overall well-appearing with stable vital signs, noted focal tenderness in her left lower quadrant.  Her labs were grossly within normal limits.  CT scan concerning for inflammatory or infectious colitis versus diverticulitis.  Clinically based on symptomatology, suspect most likely diverticulitis.  Given patient requiring multiple doses of IV pain medicine for pain control as well as her past history of stricture requiring partial colectomy, will admit for antibiotics, pain control and observation.  Consult [facility] hospitalist for admission.  Final Clinical Impression(s) / ED Diagnoses Final diagnoses:  Diverticulitis  Colitis    Rx / DC Orders ED Discharge Orders    None       Lucrezia Starch, MD 02/19/20 1940

## 2020-02-20 DIAGNOSIS — K5904 Chronic idiopathic constipation: Secondary | ICD-10-CM | POA: Diagnosis not present

## 2020-02-20 DIAGNOSIS — K219 Gastro-esophageal reflux disease without esophagitis: Secondary | ICD-10-CM | POA: Diagnosis present

## 2020-02-20 DIAGNOSIS — E785 Hyperlipidemia, unspecified: Secondary | ICD-10-CM | POA: Diagnosis present

## 2020-02-20 DIAGNOSIS — K5900 Constipation, unspecified: Secondary | ICD-10-CM

## 2020-02-20 DIAGNOSIS — K56699 Other intestinal obstruction unspecified as to partial versus complete obstruction: Secondary | ICD-10-CM

## 2020-02-20 DIAGNOSIS — K633 Ulcer of intestine: Secondary | ICD-10-CM | POA: Diagnosis present

## 2020-02-20 DIAGNOSIS — Z8349 Family history of other endocrine, nutritional and metabolic diseases: Secondary | ICD-10-CM | POA: Diagnosis not present

## 2020-02-20 DIAGNOSIS — Z79899 Other long term (current) drug therapy: Secondary | ICD-10-CM | POA: Diagnosis not present

## 2020-02-20 DIAGNOSIS — R933 Abnormal findings on diagnostic imaging of other parts of digestive tract: Secondary | ICD-10-CM

## 2020-02-20 DIAGNOSIS — Z96653 Presence of artificial knee joint, bilateral: Secondary | ICD-10-CM | POA: Diagnosis present

## 2020-02-20 DIAGNOSIS — Z823 Family history of stroke: Secondary | ICD-10-CM | POA: Diagnosis not present

## 2020-02-20 DIAGNOSIS — N6019 Diffuse cystic mastopathy of unspecified breast: Secondary | ICD-10-CM | POA: Diagnosis present

## 2020-02-20 DIAGNOSIS — Z87891 Personal history of nicotine dependence: Secondary | ICD-10-CM | POA: Diagnosis not present

## 2020-02-20 DIAGNOSIS — G8929 Other chronic pain: Secondary | ICD-10-CM | POA: Diagnosis present

## 2020-02-20 DIAGNOSIS — M1990 Unspecified osteoarthritis, unspecified site: Secondary | ICD-10-CM | POA: Diagnosis present

## 2020-02-20 DIAGNOSIS — Z880 Allergy status to penicillin: Secondary | ICD-10-CM | POA: Diagnosis not present

## 2020-02-20 DIAGNOSIS — Z882 Allergy status to sulfonamides status: Secondary | ICD-10-CM | POA: Diagnosis not present

## 2020-02-20 DIAGNOSIS — Z800 Family history of malignant neoplasm of digestive organs: Secondary | ICD-10-CM | POA: Diagnosis not present

## 2020-02-20 DIAGNOSIS — K529 Noninfective gastroenteritis and colitis, unspecified: Secondary | ICD-10-CM

## 2020-02-20 DIAGNOSIS — Z9049 Acquired absence of other specified parts of digestive tract: Secondary | ICD-10-CM | POA: Diagnosis not present

## 2020-02-20 DIAGNOSIS — Z8261 Family history of arthritis: Secondary | ICD-10-CM | POA: Diagnosis not present

## 2020-02-20 DIAGNOSIS — M549 Dorsalgia, unspecified: Secondary | ICD-10-CM | POA: Diagnosis present

## 2020-02-20 DIAGNOSIS — K551 Chronic vascular disorders of intestine: Secondary | ICD-10-CM | POA: Diagnosis not present

## 2020-02-20 DIAGNOSIS — K559 Vascular disorder of intestine, unspecified: Secondary | ICD-10-CM | POA: Diagnosis present

## 2020-02-20 DIAGNOSIS — Z8249 Family history of ischemic heart disease and other diseases of the circulatory system: Secondary | ICD-10-CM | POA: Diagnosis not present

## 2020-02-20 DIAGNOSIS — Z20822 Contact with and (suspected) exposure to covid-19: Secondary | ICD-10-CM | POA: Diagnosis present

## 2020-02-20 DIAGNOSIS — K6289 Other specified diseases of anus and rectum: Secondary | ICD-10-CM | POA: Diagnosis not present

## 2020-02-20 LAB — CBC
HCT: 32 % — ABNORMAL LOW (ref 36.0–46.0)
Hemoglobin: 10.9 g/dL — ABNORMAL LOW (ref 12.0–15.0)
MCH: 29.9 pg (ref 26.0–34.0)
MCHC: 34.1 g/dL (ref 30.0–36.0)
MCV: 87.9 fL (ref 80.0–100.0)
Platelets: 212 10*3/uL (ref 150–400)
RBC: 3.64 MIL/uL — ABNORMAL LOW (ref 3.87–5.11)
RDW: 14 % (ref 11.5–15.5)
WBC: 7.7 10*3/uL (ref 4.0–10.5)
nRBC: 0 % (ref 0.0–0.2)

## 2020-02-20 LAB — BASIC METABOLIC PANEL
Anion gap: 9 (ref 5–15)
BUN: 14 mg/dL (ref 8–23)
CO2: 24 mmol/L (ref 22–32)
Calcium: 8.6 mg/dL — ABNORMAL LOW (ref 8.9–10.3)
Chloride: 102 mmol/L (ref 98–111)
Creatinine, Ser: 0.81 mg/dL (ref 0.44–1.00)
GFR, Estimated: >60 mL/min (ref >60)
Glucose, Bld: 116 mg/dL — ABNORMAL HIGH (ref 70–99)
Potassium: 4.2 mmol/L (ref 3.5–5.1)
Sodium: 135 mmol/L (ref 135–145)

## 2020-02-20 MED ORDER — METOCLOPRAMIDE HCL 5 MG/ML IJ SOLN
10.0000 mg | Freq: Once | INTRAMUSCULAR | Status: DC
  Filled 2020-02-20: qty 2

## 2020-02-20 MED ORDER — METOCLOPRAMIDE HCL 5 MG/ML IJ SOLN
10.0000 mg | Freq: Once | INTRAMUSCULAR | Status: AC
  Administered 2020-02-20: 10 mg via INTRAVENOUS
  Filled 2020-02-20: qty 2

## 2020-02-20 MED ORDER — PEG-KCL-NACL-NASULF-NA ASC-C 100 G PO SOLR: 1.0000 | Freq: Once | ORAL | Status: DC

## 2020-02-20 MED ORDER — SENNOSIDES-DOCUSATE SODIUM 8.6-50 MG PO TABS
2.0000 | ORAL_TABLET | Freq: Two times a day (BID) | ORAL | Status: DC
  Administered 2020-02-20 – 2020-02-22 (×3): 2 via ORAL
  Filled 2020-02-20 (×3): qty 2

## 2020-02-20 MED ORDER — PEG-KCL-NACL-NASULF-NA ASC-C 100 G PO SOLR
0.5000 | Freq: Once | ORAL | Status: AC
  Administered 2020-02-20: 100 g via ORAL
  Filled 2020-02-20: qty 1

## 2020-02-20 MED ORDER — ENOXAPARIN SODIUM 80 MG/0.8ML ~~LOC~~ SOLN
65.0000 mg | Freq: Every day | SUBCUTANEOUS | Status: DC
  Administered 2020-02-21: 65 mg via SUBCUTANEOUS
  Filled 2020-02-20: qty 0.8

## 2020-02-20 MED ORDER — FLEET ENEMA 7-19 GM/118ML RE ENEM
1.0000 | ENEMA | Freq: Four times a day (QID) | RECTAL | Status: AC
  Administered 2020-02-20: 1 via RECTAL
  Filled 2020-02-20: qty 1

## 2020-02-20 MED ORDER — POLYETHYLENE GLYCOL 3350 17 G PO PACK
17.0000 g | PACK | Freq: Two times a day (BID) | ORAL | Status: DC
  Administered 2020-02-20 – 2020-02-22 (×3): 17 g via ORAL
  Filled 2020-02-20 (×3): qty 1

## 2020-02-20 NOTE — Progress Notes (Signed)
PROGRESS NOTE    Monique Gonzalez  WUJ:[id number] DOB: May 02, 1955 DOA: 02/19/2020 PCP: Susy Frizzle, MD    Brief Narrative:  Monique Gonzalez is a 64 year old female with past medical history significant for bowel stricture secondary to ischemia status post partial colectomy in 2015, GERD, hyperlipidemia, depression/anxiety, obesity who presents to the ED with abdominal discomfort associated with nausea and constipation. Patient reports last bowel movement 02/14/2020; and reports usually has a bowel movement every other day. Has been taking increased laxatives at home without effect. Denies fever or chills.  In the ED, temperature 98.3, HR 104, RR 20, BP 125/84, SPO2 100% on room air. WBC count 10.2, hemoglobin 13.4, platelets 288. Sodium 136, potassium 4.5, chloride 100, CO2 23, glucose 138, BUN 14, creatinine 1.09, lipase 25, AST 16, ALT 20, total bilirubin 0.9. Urinalysis with trace leukoesterase, negative nitrite, few bacteria, 6/10 WBCs. SARS-CoV-2/influenza A/B PCR negative. CT abdomen/pelvis with contrast with large stool burden throughout the colon with inflammation/stranding around the sigmoid colon which is rather diffuse consistent with diverticulitis versus infectious/inflammatory colitis. Given her significant pain associate with nausea/vomiting, hospital service was consulted for admission for further evaluation and management.   Assessment & Plan:   Principal Problem:   Colitis Active Problems:   Constipation   Severe constipation Hx sigmoid stricture status post partial colectomy 2015 Patient presenting with progressive abdominal pain associate with nausea/vomiting. CT abdomen/pelvis with significant stool burden throughout the colon. History of sigmoid stricture requiring partial colectomy in 2015 likely secondary to ischemia. --[facility] following; appreciate assistance --Senna 2 tablets p.o. twice daily --MiraLAX twice daily --Fleets enema x 2 today --GI considering  sigmoid versus colonoscopy --Clear liquid diet  Colitis CT abdomen/pelvis with inflammation/stranding surrounding the sigmoid colon consistent with diverticulitis versus infectious versus inflammatory colitis. --Continue empiric antibiotics with ceftriaxone and Flagyl --Monitor CBC daily  Hyperlipidemia --Atorvastatin 40 mg p.o. daily  Depression/anxiety: Citalopram 20 mg p.o. daily  GERD: Protonix 40 mg p.o. daily   DVT prophylaxis: Lovenox Code Status: Full code Family Communication: No family present at bedside  Disposition Plan:  Status is: Observation  The patient remains OBS appropriate and will d/c before 2 midnights.  Dispo: The patient is from: Home              Anticipated d/c is to: Home              Anticipated d/c date is: 1 day              Patient currently is not medically stable to d/c.   Consultants:   [facility]  Procedures:   None  Antimicrobials:   Cipro 12/12 - 12/12 (stopped for possible allergic reaction per patient)  Ceftriaxone 12/12>>  Flagyl 12/12>>   Subjective: Patient seen and examined at bedside, resting comfortably. Abdominal cramping lower quadrant slightly improved. Does report small bowel movements while in the ER yesterday. Continues with some nausea and significant abdominal discomfort. Seen by GI this morning, plan for possible sigmoidoscopy versus colonoscopy; given history of previous colonic stricture. No other questions or concerns at this time. Denies headache, no fever/chills/night sweats, no vomiting/diarrhea, no chest pain, no palpitations, no shortness of breath, no cough/congestion, no fatigue. No acute events overnight per nursing staff.  Objective: Vitals:   02/19/20 2135 02/20/20 0008 02/20/20 0109 02/20/20 0538  BP: 118/71 138/61 127/72 130/66  Pulse: 80 86 85 88  Resp: 16 16  18   Temp:  98.2 F (36.8 C) 98 F (36.7 C)  98.7 F (37.1 C)  TempSrc:  Oral Oral Oral  SpO2: 99% 95% 96% 98%  Weight:   (!)  137.5 kg   Height:   5\' 11"  (1.803 m)     Intake/Output Summary (Last 24 hours) at 02/20/2020 1110 Last data filed at 02/20/2020 0800 Gross per 24 hour  Intake 1872.02 ml  Output --  Net 1872.02 ml   Filed Weights   02/20/20 0109  Weight: (!) 137.5 kg    Examination:  General exam: Appears calm and comfortable  Respiratory system: Clear to auscultation. Respiratory effort normal. On room air Cardiovascular system: S1 & S2 heard, RRR. No JVD, murmurs, rubs, gallops or clicks. No pedal edema. Gastrointestinal system: Abdomen is nondistended, soft with mild generalized tenderness. No organomegaly or masses felt. Normal bowel sounds heard. Central nervous system: Alert and oriented. No focal neurological deficits. Extremities: Symmetric 5 x 5 power. Skin: No rashes, lesions or ulcers Psychiatry: Judgement and insight appear normal. Mood & affect appropriate.     Data Reviewed: I have personally reviewed following labs and imaging studies  CBC: Recent Labs  Lab 02/19/20 1104 02/20/20 0317  WBC 10.2 7.7  HGB 13.4 10.9*  HCT 40.3 32.0*  MCV 88.6 87.9  PLT 288 449   Basic Metabolic Panel: Recent Labs  Lab 02/19/20 1104 02/20/20 0317  NA 136 135  K 4.5 4.2  CL 100 102  CO2 23 24  GLUCOSE 138* 116*  BUN 14 14  CREATININE 1.09* 0.81  CALCIUM 9.2 8.6*   GFR: Estimated Creatinine Clearance: 105.2 mL/min (by C-G formula based on SCr of 0.81 mg/dL). Liver Function Tests: Recent Labs  Lab 02/19/20 1104  AST 16  ALT 20  ALKPHOS 65  BILITOT 0.9  PROT 6.9  ALBUMIN 3.6   Recent Labs  Lab 02/19/20 1104  LIPASE 25   No results for input(s): AMMONIA in the last 168 hours. Coagulation Profile: No results for input(s): INR, PROTIME in the last 168 hours. Cardiac Enzymes: No results for input(s): CKTOTAL, CKMB, CKMBINDEX, TROPONINI in the last 168 hours. BNP (last 3 results) No results for input(s): PROBNP in the last 8760 hours. HbA1C: No results for input(s):  HGBA1C in the last 72 hours. CBG: No results for input(s): GLUCAP in the last 168 hours. Lipid Profile: No results for input(s): CHOL, HDL, LDLCALC, TRIG, CHOLHDL, LDLDIRECT in the last 72 hours. Thyroid Function Tests: No results for input(s): TSH, T4TOTAL, FREET4, T3FREE, THYROIDAB in the last 72 hours. Anemia Panel: No results for input(s): VITAMINB12, FOLATE, FERRITIN, TIBC, IRON, RETICCTPCT in the last 72 hours. Sepsis Labs: No results for input(s): PROCALCITON, LATICACIDVEN in the last 168 hours.  Recent Results (from the past 240 hour(s))  Resp Panel by RT-PCR (Flu A&B, Covid) Nasopharyngeal Swab     Status: None   Collection Time: 02/19/20  8:07 PM   Specimen: Nasopharyngeal Swab; Nasopharyngeal(NP) swabs in vial transport medium  Result Value Ref Range Status   SARS Coronavirus 2 by RT PCR NEGATIVE NEGATIVE Final    Comment: (NOTE) SARS-CoV-2 target nucleic acids are NOT DETECTED.  The SARS-CoV-2 RNA is generally detectable in upper respiratory specimens during the acute phase of infection. The lowest concentration of SARS-CoV-2 viral copies this assay can detect is 138 copies/mL. A negative result does not preclude SARS-Cov-2 infection and should not be used as the sole basis for treatment or other patient management decisions. A negative result may occur with  improper specimen collection/handling, submission of specimen other than nasopharyngeal swab,  presence of viral mutation(s) within the areas targeted by this assay, and inadequate number of viral copies(<138 copies/mL). A negative result must be combined with clinical observations, patient history, and epidemiological information. The expected result is Negative.  Fact Sheet for Patients:  EntrepreneurPulse.com.au  Fact Sheet for Healthcare Providers:  IncredibleEmployment.be  This test is no t yet approved or cleared by the [place] FDA and  has been authorized for  detection and/or diagnosis of SARS-CoV-2 by FDA under an Emergency Use Authorization (EUA). This EUA will remain  in effect (meaning this test can be used) for the duration of the COVID-19 declaration under Section 564(b)(1) of the Act, 21 U.S.C.section 360bbb-3(b)(1), unless the authorization is terminated  or revoked sooner.       Influenza A by PCR NEGATIVE NEGATIVE Final   Influenza B by PCR NEGATIVE NEGATIVE Final    Comment: (NOTE) The Xpert Xpress SARS-CoV-2/FLU/RSV plus assay is intended as an aid in the diagnosis of influenza from Nasopharyngeal swab specimens and should not be used as a sole basis for treatment. Nasal washings and aspirates are unacceptable for Xpert Xpress SARS-CoV-2/FLU/RSV testing.  Fact Sheet for Patients: EntrepreneurPulse.com.au  Fact Sheet for Healthcare Providers: IncredibleEmployment.be  This test is not yet approved or cleared by the [place] FDA and has been authorized for detection and/or diagnosis of SARS-CoV-2 by FDA under an Emergency Use Authorization (EUA). This EUA will remain in effect (meaning this test can be used) for the duration of the COVID-19 declaration under Section 564(b)(1) of the Act, 21 U.S.C. section 360bbb-3(b)(1), unless the authorization is terminated or revoked.  Performed at [facility], [place] [address], [place], [place] [address]          Radiology Studies: CT ABDOMEN PELVIS W CONTRAST  Result Date: 02/19/2020 CLINICAL DATA:  Left lower quadrant pain EXAM: CT ABDOMEN AND PELVIS WITH CONTRAST TECHNIQUE: Multidetector CT imaging of the abdomen and pelvis was performed using the standard protocol following bolus administration of intravenous contrast. CONTRAST:  15mL OMNIPAQUE IOHEXOL 300 MG/ML  SOLN COMPARISON:  01/14/2017 FINDINGS: Lower chest: Linear atelectasis in the lung bases.  No effusions. Hepatobiliary: No focal hepatic abnormality. Gallbladder  unremarkable. Pancreas: No focal abnormality or ductal dilatation. Spleen: No focal abnormality.  Normal size. Adrenals/Urinary Tract: Left adrenal nodule measures 1.8 cm. Right adrenal gland unremarkable. Kidneys and urinary bladder unremarkable. Stomach/Bowel: Large stool burden throughout the colon. Postoperative changes in the distal descending colon. There is inflammation around the sigmoid colon. There are a few scattered diverticula. Inflammation appears more diffuse than diverticulitis, possibly colitis. Stomach and small bowel decompressed, unremarkable. Vascular/Lymphatic: No evidence of aneurysm or adenopathy. Reproductive: Insert female pelvis Other: No free fluid or free air. Musculoskeletal: No acute bony abnormality. IMPRESSION: Large stool burden throughout the colon. There is inflammation/stranding around the sigmoid colon which appears rather diffuse. This could reflect changes of diverticulitis or infectious/inflammatory colitis. Small nodule in the left adrenal gland, likely adenoma. Electronically Signed   By: Rolm Baptise M.D.   On: 02/19/2020 19:07        Scheduled Meds: . atorvastatin  40 mg Oral QHS  . bisacodyl  10 mg Rectal Once  . cholecalciferol  1,000 Units Oral Q breakfast  . citalopram  20 mg Oral QHS  . enoxaparin (LOVENOX) injection  40 mg Subcutaneous QHS  . loratadine  10 mg Oral QHS  . melatonin  10 mg Oral QHS  . metroNIDAZOLE  500 mg Oral Q8H  . omega-3 acid  ethyl esters  1 g Oral Daily  . pantoprazole  40 mg Oral QAC breakfast  . polyethylene glycol  17 g Oral BID  . senna-docusate  2 tablet Oral BID  . sodium phosphate  1 enema Rectal Q6H   Continuous Infusions: . cefTRIAXone (ROCEPHIN)  IV Stopped (02/19/20 2227)  . lactated ringers 150 mL/hr at 02/20/20 0512     LOS: 0 days    Time spent: 38 minutes spent on chart review, discussion with nursing staff, consultants, updating family and interview/physical exam; more than 50% of that time was  spent in counseling and/or coordination of care.    Kirsta Probert J [place] DO [facility] Available via Epic secure chat 7am-7pm After these hours, please refer to coverage provider listed on [email or web address] 02/20/2020, 11:10 AM

## 2020-02-20 NOTE — H&P (View-Only) (Signed)
[facility] Consult: 10:21 AM 02/20/2020  LOS: 0 days    Referring Provider: Dr [place]  Primary Care Physician:  Susy Frizzle, MD Primary Gastroenterologist:  Dr Ardis Hughs    Reason for Consultation:  Constipation, abdominal pain.     HPI: Monique Gonzalez is a 64 y.o. female.  PMH obesity.  Depression/anxiety.  Underwent partial colectomy for ischemic stricture in 2015.  Fibrocystic breast disease per biopsy 02/2019.  Multiple orthopedic surgeries of the knee, wrist, ankle, hip.  Was hospitalized in early 11/2019 with left wrist, right ankle fractures following mechanical fall underwent wrist surgery.  10/2013 attempted colonoscopy.  For abdominal pain, constipation and abnormal CT.  This showed stenosis at the splenic flexure.  Could not pass scope beyond this area.  Pathology from biopsies do not support a diagnosis of IBD. 10/2013 segmental colectomy for splenic stricture likely from chronic ischemia.  Pathology confirmed ulceration, inflammation, fibrosis favoring ischemia 12/2013 colonoscopy.  Dr. Ardis Hughs.   Terminal ileum normal.  Left sided anastomosis mildly inflamed.  Colon proximal to anastomosis tortuous with an area that appeared to have chronic scarring.  Otherwise unremarkable study.  Right and left colon randomly biopsied. Right colon biopsy showed prominent, benign lymphoid aggregates, no features of IBD, microscopic colitis or infectious colitis, no dysplasia.  Left colon biopsies showed benign mucosa with crypt distortion and nonspecific inflammation, no dysplasia, primary differential considerations include mucosal injury associated with stricture and quiescent IBD.  Dr. Ardis Hughs letter to the patient mentioned that the biopsies were essentially normal and she needed repeat colonoscopy in  01/2024  Patient has issues with constipation.  She takes Colace every day.  Moves her bowels 2-3 times a week.  She has not had a bowel movement in 3 days she will take OTC laxatives which generally resolve the issue. Last Tuesday was her last bowel movement.  By Friday she had not had any stools and began taking OTC combination stool softener and laxative.  Took 24 of these within 24 hours but by Saturday evening had not had any bowel movement.  Was having worsening abdominal pain, predominantly on the left side in a cramping pattern.  Some nausea, no emesis.  Was drinking plenty of fluids but not eating much in the way of solid food.  Came to the ED Sunday morning.  Now on observation admission.  Had a moderate sized bowel movement last night.  LLQ pain persists.  Empiric Rocephin, Flagyl in place along with Senokot, MiraLAX  CTAP w contrast.  Large stool burden throughout the colon.  Diffuse  inflammation/stranding at the sigmoid, ?  Infectious/inflammatory colitis versus diverticulitis. Hgb 13.4 >> 10.9.  MCV 88.  WBCs 10.2. Lipase, LFTs normal. Electrolytes normal.  Glucose 138. UA with 21-50 RBCs/hpf, 6-10 WBCs.  Trace leukocytes.  Past Medical History:  Diagnosis Date  . Allergy   . Chronic pain    back and knee  . Colitis 09/2012   infectious vs inflammatory.   . Colon stricture ([place])   . Depression   . Hyperlipidemia   . Obesity   .  Osteoarthritis   . Osteopenia   . Recurrent cold sores   . Seasonal allergies   . Syncope and collapse 11/30/2012   Normal EEG.  Vaso vagal syncope    Past Surgical History:  Procedure Laterality Date  . COLON RESECTION N/A 10/14/2013   Procedure: LAPAROSCOPIC MOBILIZATION OF SPLENIC FLEXURE, LAPAROSCOPIC EXTENDED LEFT COLECTOMY;  Surgeon: [name], MD;  Location: [place];  Service: General;  Laterality: N/A;  . COLONOSCOPY N/A 10/12/2013   Procedure: COLONOSCOPY;  Surgeon: [name], MD;  Location: [facility] ENDOSCOPY;  Service: Endoscopy;   Laterality: N/A;  . DILATION AND CURETTAGE, DIAGNOSTIC / THERAPEUTIC  1987  . OPEN REDUCTION INTERNAL FIXATION (ORIF) DISTAL RADIAL FRACTURE Right 08/08/2014   Procedure: OPEN REDUCTION INTERNAL FIXATION (ORIF) DISTAL RADIAL FRACTURE;  Surgeon: Roseanne Kaufman, MD;  Location: [place];  Service: Orthopedics;  Laterality: Right;  DVR [place], MD available sometime around 1430-1500  . ORIF ANKLE FRACTURE Right 11/10/2019   Procedure: POSSIBLEOPEN REDUCTION INTERNAL FIXATION (ORIF) ANKLE FRACTURE;  Surgeon: Shona Needles, MD;  Location: [place];  Service: Orthopedics;  Laterality: Right;  . ORIF WRIST FRACTURE Left 11/10/2019   Procedure: OPEN REDUCTION INTERNAL FIXATION (ORIF) WRIST FRACTURE;  Surgeon: Shona Needles, MD;  Location: [place];  Service: Orthopedics;  Laterality: Left;  . TONSILLECTOMY    . TOTAL KNEE ARTHROPLASTY Right 08/05/2016  . TOTAL KNEE ARTHROPLASTY Right 08/05/2016   Procedure: RIGHT TOTAL KNEE ARTHROPLASTY;  Surgeon: Mcarthur Rossetti, MD;  Location: [place];  Service: Orthopedics;  Laterality: Right;    Prior to Admission medications   Medication Sig Start Date End Date Taking? Authorizing Provider  acetaminophen (TYLENOL) 650 MG CR tablet Take 1,300 mg by mouth at bedtime.   Yes [provider]  atorvastatin (LIPITOR) 40 MG tablet Take 1 tablet (40 mg total) by mouth daily. Patient taking differently: Take 40 mg by mouth at bedtime. 06/15/19  Yes Susy Frizzle, MD  Calcium Carb-Cholecalciferol (CALCIUM 600+D3 PO) Take 1 tablet by mouth in the morning and at bedtime.   Yes [provider]  Cholecalciferol (VITAMIN D-3) 25 MCG (1000 UT) CAPS Take 1,000 Units by mouth daily with breakfast.   Yes [provider]  citalopram (CELEXA) 20 MG tablet Take 1 tablet (20 mg total) by mouth at bedtime. 06/15/19  Yes Susy Frizzle, MD  cyclobenzaprine (FLEXERIL) 10 MG tablet Take 1 tablet (10 mg total) by mouth 3 (three) times daily as needed for muscle  spasms. Patient taking differently: Take 10 mg by mouth See admin instructions. Take 10 mg by mouth at bedtime and an additional 10 mg two times a day as needed for muscle spasms 06/15/19  Yes Susy Frizzle, MD  docusate sodium (COLACE) 100 MG capsule Take 100 mg by mouth in the morning.   Yes [provider]  fluticasone (FLONASE) 50 MCG/ACT nasal spray Place 2 sprays into both nostrils daily as needed (for seasonal allergies).   Yes [provider]  loratadine (CLARITIN) 10 MG tablet Take 10 mg by mouth at bedtime.   Yes [provider]  Melatonin 10 MG TABS Take 10 mg by mouth at bedtime.   Yes [provider]  Multiple Vitamins-Minerals (ONE-A-DAY WOMENS PO) Take 1 tablet by mouth daily with breakfast.   Yes [provider]  naproxen sodium (ALEVE) 220 MG tablet Take 220 mg by mouth 2 (two) times daily as needed (for pain).   Yes [provider]  Omega-3 Fatty Acids (  FISH OIL) 1000 MG CAPS Take 1,000 mg by mouth daily after breakfast.   Yes [provider]  pantoprazole (PROTONIX) 40 MG tablet Take 1 tablet (40 mg total) by mouth daily. Patient taking differently: Take 40 mg by mouth daily before breakfast. 06/15/19  Yes Susy Frizzle, MD    Scheduled Meds: . atorvastatin  40 mg Oral QHS  . bisacodyl  10 mg Rectal Once  . cholecalciferol  1,000 Units Oral Q breakfast  . citalopram  20 mg Oral QHS  . enoxaparin (LOVENOX) injection  40 mg Subcutaneous QHS  . loratadine  10 mg Oral QHS  . melatonin  10 mg Oral QHS  . metroNIDAZOLE  500 mg Oral Q8H  . omega-3 acid ethyl esters  1 g Oral Daily  . pantoprazole  40 mg Oral QAC breakfast  . polyethylene glycol  17 g Oral BID  . senna-docusate  2 tablet Oral BID   Infusions: . cefTRIAXone (ROCEPHIN)  IV Stopped (02/19/20 2227)  . lactated ringers 150 mL/hr at 02/20/20 0512   PRN Meds: acetaminophen **OR** acetaminophen, fluticasone, HYDROmorphone (DILAUDID) injection,  naproxen sodium, ondansetron **OR** ondansetron (ZOFRAN) IV   Allergies as of 02/19/2020 - Review Complete 02/19/2020  Allergen Reaction Noted  . Ciprofloxacin Itching, Dermatitis, and Rash 02/19/2020  . Penicillins Rash and Other (See Comments) 09/23/2010  . Sulfa antibiotics Rash 09/23/2010    Family History  Problem Relation Age of Onset  . Stroke Mother   . Arthritis Mother   . Thyroid disease Mother   . Cancer Father   . Arthritis Sister   . Thyroid disease Sister   . Cancer Maternal Grandmother   . Stroke Maternal Grandfather   . Cancer Maternal Grandfather   . Colon cancer Maternal Grandfather   . Cancer Paternal Grandmother   . Heart disease Paternal Grandmother   . Colon cancer Paternal Grandmother   . Stroke Paternal Grandfather   . Arthritis Sister   . Obesity Sister   . Sudden death Neg Hx   . Hypertension Neg Hx   . Hyperlipidemia Neg Hx   . Heart attack Neg Hx   . Diabetes Neg Hx   . Rectal cancer Neg Hx   . Stomach cancer Neg Hx   . Esophageal cancer Neg Hx     Social History   Socioeconomic History  . Marital status: Divorced    Spouse name: Not on file  . Number of children: 2  . Years of education: college  . Highest education level: Not on file  Occupational History  . 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 Smoking status: Former Smoker    Quit date: 10/27/1993    Years since quitting: 26.3  . Smokeless tobacco: Never Used  Vaping Use  . Vaping Use: Never used  Substance and Sexual Activity  . Alcohol use: Yes    Comment: one drink per week  . Drug use: No  . Sexual activity: Not Currently  Other Topics Concern  . Not on file  Social History Narrative  . Not on file   Social Determinants of Health   Financial Resource Strain: Not on file  Food Insecurity: Not on file  Transportation Needs: Not on file  Physical Activity: Not on file  Stress: Not on file  Social  Connections: Not on file  Intimate Partner Violence: Not on file    REVIEW OF SYSTEMS: Constitutional:  General good energy level, no fatigue ENT:  No nose bleeds Pulm: Denies shortness of breath or cough. CV:  No palpitations, no LE edema.  No chest pressure or pain GU:  No hematuria, no frequency, no dysuria. GI: See HPI. Heme: No unusual or excessive bleeding or bruising. Transfusions: None Neuro:  No headaches, no peripheral tingling or numbness.  No syncope, no seizures. Derm:  No itching, no rash or sores.  Endocrine:  No sweats or chills.  No polyuria or dysuria Immunization: Vaccination history reviewed.  She received Pfizer COVID-19 vaccination and the booster was given in 01/2020 Travel:  None beyond local counties in last few months.    PHYSICAL EXAM: Vital signs in last 24 hours: Vitals:   02/20/20 0109 02/20/20 0538  BP: 127/72 130/66  Pulse: 85 88  Resp:  18  Temp: 98 F (36.7 C) 98.7 F (37.1 C)  SpO2: 96% 98%   Wt Readings from Last 3 Encounters:  02/20/20 (!) 137.5 kg  11/10/19 132.9 kg  01/11/19 123.4 kg    General: Pleasant alert, comfortable, not ill-appearing Head: No facial asymmetry or swelling.  No signs of head trauma. Eyes: No scleral icterus.  No conjunctival pallor.  EOMI. Ears: Not hard. Nose: No congestion or discharge Mouth: Oral mucosa is moist, pink, clear.  Tongue midline.  Good dentition. Neck: No JVD, masses, thyromegaly Lungs: Clear bilaterally.  No labored breathing or cough Heart: RRR.  No MRG.  S1, S2 present Abdomen: Obese, soft.  Tenderness on the left side, most pronounced in left lower quadrant but no guarding or rebound.  No masses, HSM, bruits, hernias.   Rectal: Small volume of formed stool palpable, light to medium brown in color, FOBT negative.  No rectal masses. Musc/Skeltl: No joint redness, swelling or gross deformity. Extremities: No CCE. Neurologic: Oriented x3.  Good historian.  Moves all 4 limbs without tremor,  strength not tested Skin: No telangiectasia, sores, rashes Tattoos: None observed Nodes: No cervical adenopathy Psych: Cooperative, calm, pleasant, fluid speech.  Intake/Output from previous day: 12/12 0701 - 12/13 0700 In: 1632 [I.V.:1001.6; IV Piggyback:630.4] Out: -  Intake/Output this shift: Total I/O In: 240 [P.O.:240] Out: -   LAB RESULTS: Recent Labs    02/19/20 1104 02/20/20 0317  WBC 10.2 7.7  HGB 13.4 10.9*  HCT 40.3 32.0*  PLT 288 212   BMET Lab Results  Component Value Date   NA 135 02/20/2020   NA 136 02/19/2020   NA 140 11/09/2019   K 4.2 02/20/2020   K 4.5 02/19/2020   K 4.2 11/09/2019   CL 102 02/20/2020   CL 100 02/19/2020   CL 106 11/09/2019   CO2 24 02/20/2020   CO2 23 02/19/2020   CO2 25 11/09/2019   GLUCOSE 116 (H) 02/20/2020   GLUCOSE 138 (H) 02/19/2020   GLUCOSE 113 (H) 11/09/2019   BUN 14 02/20/2020   BUN 14 02/19/2020   BUN 13 11/09/2019   CREATININE 0.81 02/20/2020   CREATININE 1.09 (H) 02/19/2020   CREATININE 0.89 11/09/2019   CALCIUM 8.6 (L) 02/20/2020   CALCIUM 9.2 02/19/2020   CALCIUM 9.0 11/09/2019   LFT Recent Labs    02/19/20 1104  PROT 6.9  ALBUMIN 3.6  AST 16  ALT 20  ALKPHOS 65  BILITOT 0.9   PT/INR No results found for: INR, PROTIME Hepatitis Panel No results for input(s): HEPBSAG, HCVAB, HEPAIGM, HEPBIGM in the last 72 hours. C-Diff No components found for: CDIFF Lipase  Component Value Date/Time   LIPASE 25 02/19/2020 1104    Drugs of Abuse  No results found for: LABOPIA, COCAINSCRNUR, LABBENZ, AMPHETMU, THCU, LABBARB   RADIOLOGY STUDIES: CT ABDOMEN PELVIS W CONTRAST  Result Date: 02/19/2020 CLINICAL DATA:  Left lower quadrant pain EXAM: CT ABDOMEN AND PELVIS WITH CONTRAST TECHNIQUE: Multidetector CT imaging of the abdomen and pelvis was performed using the standard protocol following bolus administration of intravenous contrast. CONTRAST:  197mL OMNIPAQUE IOHEXOL 300 MG/ML  SOLN COMPARISON:   01/14/2017 FINDINGS: Lower chest: Linear atelectasis in the lung bases.  No effusions. Hepatobiliary: No focal hepatic abnormality. Gallbladder unremarkable. Pancreas: No focal abnormality or ductal dilatation. Spleen: No focal abnormality.  Normal size. Adrenals/Urinary Tract: Left adrenal nodule measures 1.8 cm. Right adrenal gland unremarkable. Kidneys and urinary bladder unremarkable. Stomach/Bowel: Large stool burden throughout the colon. Postoperative changes in the distal descending colon. There is inflammation around the sigmoid colon. There are a few scattered diverticula. Inflammation appears more diffuse than diverticulitis, possibly colitis. Stomach and small bowel decompressed, unremarkable. Vascular/Lymphatic: No evidence of aneurysm or adenopathy. Reproductive: Insert female pelvis Other: No free fluid or free air. Musculoskeletal: No acute bony abnormality. IMPRESSION: Large stool burden throughout the colon. There is inflammation/stranding around the sigmoid colon which appears rather diffuse. This could reflect changes of diverticulitis or infectious/inflammatory colitis. Small nodule in the left adrenal gland, likely adenoma. Electronically Signed   By: Rolm Baptise M.D.   On: 02/19/2020 19:07     IMPRESSION:   *   Left-sided abdominal pain, constipation, stranding/inflammation in the sigmoid per CT. Hx partial colectomy thousand 15 for stricture in the region of the splenic flexure resulting from chronic ischemia. Symptoms very similar to the presentation in 2015, rule out recurrent ischemia/stricture.    PLAN:     *     Fleets enemas x2. ?   Flexible sigmoidoscopy versus colonoscopy.   Dr. Silverio Decamp will be seeing the patient.  Switch diet to clear liquids. There is been no fever, no elevation of WBCs so low likelihood of infectious etiology but day 2 Rocephin, Flagyl.    Azucena Freed  02/20/2020, 10:21 AM Phone [phone]    Attending physician's note   I have taken  a history, examined the patient and reviewed the chart. I agree with the Advanced Practitioner's note, impression and recommendations.  64 year old very pleasant female with history of obesity, s/p segmental colectomy for ischemic stricture in 2015 admitted with worsening constipation and left-sided abdominal pain.. CT scanning suggestive of stranding and inflammation treat changes in the sigmoid colon region, area of anastomosis  Plan to proceed with colonoscopy, extend as far as possible to evaluate, concern for possible recurrent stricture at anastomosis site for our she may have it in a different area as her prior stricture was in the setting of ischemic colitis or possible undiagnosed Crohn's disease ? He has tortuous the in the right colon and has had incomplete colonoscopies in the past Plan for tapwater enema X2 and bowel prep N.p.o. after midnight  The risks and benefits as well as alternatives of endoscopic procedure(s) have been discussed and reviewed. All questions answered. The patient agrees to proceed.   The patient was provided an opportunity to ask questions and all were answered. The patient agreed with the plan and demonstrated an understanding of the instructions.  Damaris Hippo , MD [phone]

## 2020-02-20 NOTE — Progress Notes (Signed)
[facility]: Lovenox Dose Adjustment for VTE prophylaxis  OBJECTIVE:  Wt: 137.5   Ht: 5'11"  BMI~42.2 SCr 0.81  CrCl~100 ml/hr  ASSESSMENT:  49 YOM on lovenox for VTE prophylaxis requiring a dose adjustment for BMI>30 and CrCl>30 ml/min  PLAN:  1. Adjust Lovenox to 65 (~0.5 mg/kg) SQ every 24 hours 2. Pharmacy will monitor peripherally for s/sx of bleeding and any necessary dose adjustments   Alycia Rossetti, PharmD, BCPS Clinical Pharmacist 02/20/2020 4:12 PM   **Pharmacist phone directory can now be found on [email or web address] ([email or web address]).  Listed under [place].

## 2020-02-20 NOTE — Consult Note (Addendum)
[facility] Consult: 10:21 AM 02/20/2020  LOS: 0 days    Referring Provider: Dr [place]  Primary Care Physician:  Susy Frizzle, MD Primary Gastroenterologist:  Dr Ardis Hughs    Reason for Consultation:  Constipation, abdominal pain.     HPI: Monique Gonzalez is a 64 y.o. female.  PMH obesity.  Depression/anxiety.  Underwent partial colectomy for ischemic stricture in 2015.  Fibrocystic breast disease per biopsy 02/2019.  Multiple orthopedic surgeries of the knee, wrist, ankle, hip.  Was hospitalized in early 11/2019 with left wrist, right ankle fractures following mechanical fall underwent wrist surgery.  10/2013 attempted colonoscopy.  For abdominal pain, constipation and abnormal CT.  This showed stenosis at the splenic flexure.  Could not pass scope beyond this area.  Pathology from biopsies do not support a diagnosis of IBD. 10/2013 segmental colectomy for splenic stricture likely from chronic ischemia.  Pathology confirmed ulceration, inflammation, fibrosis favoring ischemia 12/2013 colonoscopy.  Dr. Ardis Hughs.   Terminal ileum normal.  Left sided anastomosis mildly inflamed.  Colon proximal to anastomosis tortuous with an area that appeared to have chronic scarring.  Otherwise unremarkable study.  Right and left colon randomly biopsied. Right colon biopsy showed prominent, benign lymphoid aggregates, no features of IBD, microscopic colitis or infectious colitis, no dysplasia.  Left colon biopsies showed benign mucosa with crypt distortion and nonspecific inflammation, no dysplasia, primary differential considerations include mucosal injury associated with stricture and quiescent IBD.  Dr. Ardis Hughs letter to the patient mentioned that the biopsies were essentially normal and she needed repeat colonoscopy in  01/2024  Patient has issues with constipation.  She takes Colace every day.  Moves her bowels 2-3 times a week.  She has not had a bowel movement in 3 days she will take OTC laxatives which generally resolve the issue. Last Tuesday was her last bowel movement.  By Friday she had not had any stools and began taking OTC combination stool softener and laxative.  Took 24 of these within 24 hours but by Saturday evening had not had any bowel movement.  Was having worsening abdominal pain, predominantly on the left side in a cramping pattern.  Some nausea, no emesis.  Was drinking plenty of fluids but not eating much in the way of solid food.  Came to the ED Sunday morning.  Now on observation admission.  Had a moderate sized bowel movement last night.  LLQ pain persists.  Empiric Rocephin, Flagyl in place along with Senokot, MiraLAX  CTAP w contrast.  Large stool burden throughout the colon.  Diffuse  inflammation/stranding at the sigmoid, ?  Infectious/inflammatory colitis versus diverticulitis. Hgb 13.4 >> 10.9.  MCV 88.  WBCs 10.2. Lipase, LFTs normal. Electrolytes normal.  Glucose 138. UA with 21-50 RBCs/hpf, 6-10 WBCs.  Trace leukocytes.  Past Medical History:  Diagnosis Date  . Allergy   . Chronic pain    back and knee  . Colitis 09/2012   infectious vs inflammatory.   . Colon stricture ([place])   . Depression   . Hyperlipidemia   . Obesity   .  Osteoarthritis   . Osteopenia   . Recurrent cold sores   . Seasonal allergies   . Syncope and collapse 11/30/2012   Normal EEG.  Vaso vagal syncope    Past Surgical History:  Procedure Laterality Date  . COLON RESECTION N/A 10/14/2013   Procedure: LAPAROSCOPIC MOBILIZATION OF SPLENIC FLEXURE, LAPAROSCOPIC EXTENDED LEFT COLECTOMY;  Surgeon: [name], MD;  Location: [facility];  Service: General;  Laterality: N/A;  . COLONOSCOPY N/A 10/12/2013   Procedure: COLONOSCOPY;  Surgeon: [name], MD;  Location: [facility]COPY;  Service: Endoscopy;   Laterality: N/A;  . DILATION AND CURETTAGE, DIAGNOSTIC / THERAPEUTIC  1987  . OPEN REDUCTION INTERNAL FIXATION (ORIF) DISTAL RADIAL FRACTURE Right 08/08/2014   Procedure: OPEN REDUCTION INTERNAL FIXATION (ORIF) DISTAL RADIAL FRACTURE;  Surgeon: Roseanne Kaufman, MD;  Location: [facility];  Service: Orthopedics;  Laterality: Right;  DVR Crosslock, MD available sometime around 1430-1500  . ORIF ANKLE FRACTURE Right 11/10/2019   Procedure: POSSIBLEOPEN REDUCTION INTERNAL FIXATION (ORIF) ANKLE FRACTURE;  Surgeon: Shona Needles, MD;  Location: [facility];  Service: Orthopedics;  Laterality: Right;  . ORIF WRIST FRACTURE Left 11/10/2019   Procedure: OPEN REDUCTION INTERNAL FIXATION (ORIF) WRIST FRACTURE;  Surgeon: Shona Needles, MD;  Location: [place];  Service: Orthopedics;  Laterality: Left;  . TONSILLECTOMY    . TOTAL KNEE ARTHROPLASTY Right 08/05/2016  . TOTAL KNEE ARTHROPLASTY Right 08/05/2016   Procedure: RIGHT TOTAL KNEE ARTHROPLASTY;  Surgeon: Mcarthur Rossetti, MD;  Location: [facility];  Service: Orthopedics;  Laterality: Right;    Prior to Admission medications   Medication Sig Start Date End Date Taking? Authorizing Provider  acetaminophen (TYLENOL) 650 MG CR tablet Take 1,300 mg by mouth at bedtime.   Yes [provider]  atorvastatin (LIPITOR) 40 MG tablet Take 1 tablet (40 mg total) by mouth daily. Patient taking differently: Take 40 mg by mouth at bedtime. 06/15/19  Yes Susy Frizzle, MD  Calcium Carb-Cholecalciferol (CALCIUM 600+D3 PO) Take 1 tablet by mouth in the morning and at bedtime.   Yes [provider]  Cholecalciferol (VITAMIN D-3) 25 MCG (1000 UT) CAPS Take 1,000 Units by mouth daily with breakfast.   Yes [provider]  citalopram (CELEXA) 20 MG tablet Take 1 tablet (20 mg total) by mouth at bedtime. 06/15/19  Yes Susy Frizzle, MD  cyclobenzaprine (FLEXERIL) 10 MG tablet Take 1 tablet (10 mg total) by mouth 3 (three) times daily as needed for muscle  spasms. Patient taking differently: Take 10 mg by mouth See admin instructions. Take 10 mg by mouth at bedtime and an additional 10 mg two times a day as needed for muscle spasms 06/15/19  Yes Susy Frizzle, MD  docusate sodium (COLACE) 100 MG capsule Take 100 mg by mouth in the morning.   Yes [provider]  fluticasone (FLONASE) 50 MCG/ACT nasal spray Place 2 sprays into both nostrils daily as needed (for seasonal allergies).   Yes [provider]  loratadine (CLARITIN) 10 MG tablet Take 10 mg by mouth at bedtime.   Yes [provider]  Melatonin 10 MG TABS Take 10 mg by mouth at bedtime.   Yes [provider]  Multiple Vitamins-Minerals (ONE-A-DAY WOMENS PO) Take 1 tablet by mouth daily with breakfast.   Yes [provider]  naproxen sodium (ALEVE) 220 MG tablet Take 220 mg by mouth 2 (two) times daily as needed (for pain).   Yes [provider]  Omega-3 Fatty Acids (  FISH OIL) 1000 MG CAPS Take 1,000 mg by mouth daily after breakfast.   Yes [provider]  pantoprazole (PROTONIX) 40 MG tablet Take 1 tablet (40 mg total) by mouth daily. Patient taking differently: Take 40 mg by mouth daily before breakfast. 06/15/19  Yes Susy Frizzle, MD    Scheduled Meds: . atorvastatin  40 mg Oral QHS  . bisacodyl  10 mg Rectal Once  . cholecalciferol  1,000 Units Oral Q breakfast  . citalopram  20 mg Oral QHS  . enoxaparin (LOVENOX) injection  40 mg Subcutaneous QHS  . loratadine  10 mg Oral QHS  . melatonin  10 mg Oral QHS  . metroNIDAZOLE  500 mg Oral Q8H  . omega-3 acid ethyl esters  1 g Oral Daily  . pantoprazole  40 mg Oral QAC breakfast  . polyethylene glycol  17 g Oral BID  . senna-docusate  2 tablet Oral BID   Infusions: . cefTRIAXone (ROCEPHIN)  IV Stopped (02/19/20 2227)  . lactated ringers 150 mL/hr at 02/20/20 0512   PRN Meds: acetaminophen **OR** acetaminophen, fluticasone, HYDROmorphone (DILAUDID) injection,  naproxen sodium, ondansetron **OR** ondansetron (ZOFRAN) IV   Allergies as of 02/19/2020 - Review Complete 02/19/2020  Allergen Reaction Noted  . Ciprofloxacin Itching, Dermatitis, and Rash 02/19/2020  . Penicillins Rash and Other (See Comments) 09/23/2010  . Sulfa antibiotics Rash 09/23/2010    Family History  Problem Relation Age of Onset  . Stroke Mother   . Arthritis Mother   . Thyroid disease Mother   . Cancer Father   . Arthritis Sister   . Thyroid disease Sister   . Cancer Maternal Grandmother   . Stroke Maternal Grandfather   . Cancer Maternal Grandfather   . Colon cancer Maternal Grandfather   . Cancer Paternal Grandmother   . Heart disease Paternal Grandmother   . Colon cancer Paternal Grandmother   . Stroke Paternal Grandfather   . Arthritis Sister   . Obesity Sister   . Sudden death Neg Hx   . Hypertension Neg Hx   . Hyperlipidemia Neg Hx   . Heart attack Neg Hx   . Diabetes Neg Hx   . Rectal cancer Neg Hx   . Stomach cancer Neg Hx   . Esophageal cancer Neg Hx     Social History   Socioeconomic History  . Marital status: Divorced    Spouse name: Not on file  . Number of children: 2  . Years of education: college  . Highest education level: Not on file  Occupational History  . 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 Smoking status: Former Smoker    Quit date: 10/27/1993    Years since quitting: 26.3  . Smokeless tobacco: Never Used  Vaping Use  . Vaping Use: Never used  Substance and Sexual Activity  . Alcohol use: Yes    Comment: one drink per week  . Drug use: No  . Sexual activity: Not Currently  Other Topics Concern  . Not on file  Social History Narrative  . Not on file   Social Determinants of Health   Financial Resource Strain: Not on file  Food Insecurity: Not on file  Transportation Needs: Not on file  Physical Activity: Not on file  Stress: Not on file  Social  Connections: Not on file  Intimate Partner Violence: Not on file    REVIEW OF SYSTEMS: Constitutional:  General good energy level, no fatigue ENT:  No nose bleeds Pulm: Denies shortness of breath or cough. CV:  No palpitations, no LE edema.  No chest pressure or pain GU:  No hematuria, no frequency, no dysuria. GI: See HPI. Heme: No unusual or excessive bleeding or bruising. Transfusions: None Neuro:  No headaches, no peripheral tingling or numbness.  No syncope, no seizures. Derm:  No itching, no rash or sores.  Endocrine:  No sweats or chills.  No polyuria or dysuria Immunization: Vaccination history reviewed.  She received Pfizer COVID-19 vaccination and the booster was given in 01/2020 Travel:  None beyond local counties in last few months.    PHYSICAL EXAM: Vital signs in last 24 hours: Vitals:   02/20/20 0109 02/20/20 0538  BP: 127/72 130/66  Pulse: 85 88  Resp:  18  Temp: 98 F (36.7 C) 98.7 F (37.1 C)  SpO2: 96% 98%   Wt Readings from Last 3 Encounters:  02/20/20 (!) 137.5 kg  11/10/19 132.9 kg  01/11/19 123.4 kg    General: Pleasant alert, comfortable, not ill-appearing Head: No facial asymmetry or swelling.  No signs of head trauma. Eyes: No scleral icterus.  No conjunctival pallor.  EOMI. Ears: Not hard. Nose: No congestion or discharge Mouth: Oral mucosa is moist, pink, clear.  Tongue midline.  Good dentition. Neck: No JVD, masses, thyromegaly Lungs: Clear bilaterally.  No labored breathing or cough Heart: RRR.  No MRG.  S1, S2 present Abdomen: Obese, soft.  Tenderness on the left side, most pronounced in left lower quadrant but no guarding or rebound.  No masses, HSM, bruits, hernias.   Rectal: Small volume of formed stool palpable, light to medium brown in color, FOBT negative.  No rectal masses. Musc/Skeltl: No joint redness, swelling or gross deformity. Extremities: No CCE. Neurologic: Oriented x3.  Good historian.  Moves all 4 limbs without tremor,  strength not tested Skin: No telangiectasia, sores, rashes Tattoos: None observed Nodes: No cervical adenopathy Psych: Cooperative, calm, pleasant, fluid speech.  Intake/Output from previous day: 12/12 0701 - 12/13 0700 In: 1632 [I.V.:1001.6; IV Piggyback:630.4] Out: -  Intake/Output this shift: Total I/O In: 240 [P.O.:240] Out: -   LAB RESULTS: Recent Labs    02/19/20 1104 02/20/20 0317  WBC 10.2 7.7  HGB 13.4 10.9*  HCT 40.3 32.0*  PLT 288 212   BMET Lab Results  Component Value Date   NA 135 02/20/2020   NA 136 02/19/2020   NA 140 11/09/2019   K 4.2 02/20/2020   K 4.5 02/19/2020   K 4.2 11/09/2019   CL 102 02/20/2020   CL 100 02/19/2020   CL 106 11/09/2019   CO2 24 02/20/2020   CO2 23 02/19/2020   CO2 25 11/09/2019   GLUCOSE 116 (H) 02/20/2020   GLUCOSE 138 (H) 02/19/2020   GLUCOSE 113 (H) 11/09/2019   BUN 14 02/20/2020   BUN 14 02/19/2020   BUN 13 11/09/2019   CREATININE 0.81 02/20/2020   CREATININE 1.09 (H) 02/19/2020   CREATININE 0.89 11/09/2019   CALCIUM 8.6 (L) 02/20/2020   CALCIUM 9.2 02/19/2020   CALCIUM 9.0 11/09/2019   LFT Recent Labs    02/19/20 1104  PROT 6.9  ALBUMIN 3.6  AST 16  ALT 20  ALKPHOS 65  BILITOT 0.9   PT/INR No results found for: INR, PROTIME Hepatitis Panel No results for input(s): HEPBSAG, HCVAB, HEPAIGM, HEPBIGM in the last 72 hours. C-Diff No components found for: CDIFF Lipase  Component Value Date/Time   LIPASE 25 02/19/2020 1104    Drugs of Abuse  No results found for: LABOPIA, COCAINSCRNUR, LABBENZ, AMPHETMU, THCU, LABBARB   RADIOLOGY STUDIES: CT ABDOMEN PELVIS W CONTRAST  Result Date: 02/19/2020 CLINICAL DATA:  Left lower quadrant pain EXAM: CT ABDOMEN AND PELVIS WITH CONTRAST TECHNIQUE: Multidetector CT imaging of the abdomen and pelvis was performed using the standard protocol following bolus administration of intravenous contrast. CONTRAST:  163mL OMNIPAQUE IOHEXOL 300 MG/ML  SOLN COMPARISON:   01/14/2017 FINDINGS: Lower chest: Linear atelectasis in the lung bases.  No effusions. Hepatobiliary: No focal hepatic abnormality. Gallbladder unremarkable. Pancreas: No focal abnormality or ductal dilatation. Spleen: No focal abnormality.  Normal size. Adrenals/Urinary Tract: Left adrenal nodule measures 1.8 cm. Right adrenal gland unremarkable. Kidneys and urinary bladder unremarkable. Stomach/Bowel: Large stool burden throughout the colon. Postoperative changes in the distal descending colon. There is inflammation around the sigmoid colon. There are a few scattered diverticula. Inflammation appears more diffuse than diverticulitis, possibly colitis. Stomach and small bowel decompressed, unremarkable. Vascular/Lymphatic: No evidence of aneurysm or adenopathy. Reproductive: Insert female pelvis Other: No free fluid or free air. Musculoskeletal: No acute bony abnormality. IMPRESSION: Large stool burden throughout the colon. There is inflammation/stranding around the sigmoid colon which appears rather diffuse. This could reflect changes of diverticulitis or infectious/inflammatory colitis. Small nodule in the left adrenal gland, likely adenoma. Electronically Signed   By: Rolm Baptise M.D.   On: 02/19/2020 19:07     IMPRESSION:   *   Left-sided abdominal pain, constipation, stranding/inflammation in the sigmoid per CT. Hx partial colectomy thousand 15 for stricture in the region of the splenic flexure resulting from chronic ischemia. Symptoms very similar to the presentation in 2015, rule out recurrent ischemia/stricture.    PLAN:     *     Fleets enemas x2. ?   Flexible sigmoidoscopy versus colonoscopy.   Dr. Silverio Decamp will be seeing the patient.  Switch diet to clear liquids. There is been no fever, no elevation of WBCs so low likelihood of infectious etiology but day 2 Rocephin, Flagyl.    Azucena Freed  02/20/2020, 10:21 AM Phone [phone]    Attending physician's note   I have taken  a history, examined the patient and reviewed the chart. I agree with the Advanced Practitioner's note, impression and recommendations.  64 year old very pleasant female with history of obesity, s/p segmental colectomy for ischemic stricture in 2015 admitted with worsening constipation and left-sided abdominal pain.. CT scanning suggestive of stranding and inflammation treat changes in the sigmoid colon region, area of anastomosis  Plan to proceed with colonoscopy, extend as far as possible to evaluate, concern for possible recurrent stricture at anastomosis site for our she may have it in a different area as her prior stricture was in the setting of ischemic colitis or possible undiagnosed Crohn's disease ? He has tortuous the in the right colon and has had incomplete colonoscopies in the past Plan for tapwater enema X2 and bowel prep N.p.o. after midnight  The risks and benefits as well as alternatives of endoscopic procedure(s) have been discussed and reviewed. All questions answered. The patient agrees to proceed.   The patient was provided an opportunity to ask questions and all were answered. The patient agreed with the plan and demonstrated an understanding of the instructions.  Damaris Hippo , MD [phone]

## 2020-02-20 NOTE — Evaluation (Signed)
Physical Therapy Evaluation Patient Details Name: Monique Gonzalez MRN: [id number] DOB: [birth year] Today's Date: 02/20/2020   History of Present Illness  Pt is a 64 y/o female admitted secondary to worsening abdominal pain, nausea and constipation. Likely secondary to colitis. PMH includes s/p colectomy,  R TKA, R ankle fx, and L wrist fx.  Clinical Impression  Pt admitted secondary to problem above with deficits below. Pt requiring min guard A for mobility tasks this session. Reports her stomach is a little less painful, however, continues to ambulate with slightly flexed posture. Pt reports she was going to outpatient therapy for her wrist, so recommend continuing at d/c. Will continue to follow acutely.     Follow Up Recommendations Other (comment) (continue outpatient PT for wrist)    Equipment Recommendations  None recommended by PT    Recommendations for Other Services       Precautions / Restrictions Precautions Precautions: Fall Restrictions Weight Bearing Restrictions: No      Mobility  Bed Mobility Overal bed mobility: Needs Assistance Bed Mobility: Sit to Supine       Sit to supine: Supervision   General bed mobility comments: Supervision for safety. Increased time and effort to perform.    Transfers Overall transfer level: Needs assistance Equipment used: None Transfers: Sit to/from Stand Sit to Stand: Min guard         General transfer comment: Min guard for safety.  Ambulation/Gait Ambulation/Gait assistance: Min guard Gait Distance (Feet): 150 Feet Assistive device: None Gait Pattern/deviations: Step-through pattern;Decreased stride length Gait velocity: Decreased   General Gait Details: Slow, cautious gait. Reporting cramping in back with increased distance. Min guard for safety.  Stairs            Wheelchair Mobility    Modified Rankin (Stroke Patients Only)       Balance Overall balance assessment: Mild deficits observed, not  formally tested                                           Pertinent Vitals/Pain Pain Assessment: Faces Faces Pain Scale: Hurts little more Pain Location: back Pain Descriptors / Indicators: Cramping Pain Intervention(s): Limited activity within patient's tolerance;Monitored during session;Repositioned    Home Living Family/patient expects to be discharged to:: Private residence Living Arrangements: Other relatives (sisters) Available Help at Discharge: Family;Available 24 hours/day Type of Home: House Home Access: Stairs to enter Entrance Stairs-Rails: None Entrance Stairs-Number of Steps: 1 Home Layout: Two level Home Equipment: Shower seat - built in;Walker - 2 wheels;Hand held shower head;Toilet riser;Cane - single point;Cane - quad;Grab bars - tub/shower      Prior Function Level of Independence: Independent         Comments: was receiving outpatient PT for L wrist fx     Hand Dominance        Extremity/Trunk Assessment   Upper Extremity Assessment Upper Extremity Assessment: LUE deficits/detail LUE Deficits / Details: hx of L wrist fx    Lower Extremity Assessment Lower Extremity Assessment: RLE deficits/detail;Generalized weakness RLE Deficits / Details: hx of R ankle fx    Cervical / Trunk Assessment Cervical / Trunk Assessment: Normal  Communication   Communication: No difficulties  Cognition Arousal/Alertness: Awake/alert Behavior During Therapy: WFL for tasks assessed/performed Overall Cognitive Status: Within Functional Limits for tasks assessed  General Comments General comments (skin integrity, edema, etc.): pt's sister present at end of session    Exercises     Assessment/Plan    PT Assessment Patient needs continued PT services  PT Problem List Decreased strength;Decreased activity tolerance;Decreased balance;Decreased mobility       PT Treatment Interventions  Gait training;Stair training;Functional mobility training;Therapeutic activities;Therapeutic exercise;Balance training;Patient/family education    PT Goals (Current goals can be found in the Care Plan section)  Acute Rehab PT Goals Patient Stated Goal: to feel better PT Goal Formulation: With patient Time For Goal Achievement: 03/05/20 Potential to Achieve Goals: Good    Frequency Min 3X/week   Barriers to discharge        Co-evaluation               AM-PAC PT "6 Clicks" Mobility  Outcome Measure Help needed turning from your back to your side while in a flat bed without using bedrails?: None Help needed moving from lying on your back to sitting on the side of a flat bed without using bedrails?: A Little Help needed moving to and from a bed to a chair (including a wheelchair)?: A Little Help needed standing up from a chair using your arms (e.g., wheelchair or bedside chair)?: A Little Help needed to walk in hospital room?: A Little Help needed climbing 3-5 steps with a railing? : A Little 6 Click Score: 19    End of Session   Activity Tolerance: Patient tolerated treatment well Patient left: in bed;with call bell/phone within reach;with nursing/sitter in room;with family/visitor present Nurse Communication: Mobility status PT Visit Diagnosis: Other abnormalities of gait and mobility (R26.89);Muscle weakness (generalized) (M62.81)    Time: 8177-1165 PT Time Calculation (min) (ACUTE ONLY): 15 min   Charges:   PT Evaluation $PT Eval Low Complexity: 1 Low          Lou Miner, DPT  Acute Rehabilitation Services  Pager: [phone] Office: [phone]   Rudean Hitt 02/20/2020, 1:19 PM

## 2020-02-21 ENCOUNTER — Encounter (HOSPITAL_COMMUNITY)
Admission: EM | Disposition: A | Discharge status: Home / Self Care | Payer: Self-pay | Source: Home / Self Care | Attending: Internal Medicine

## 2020-02-21 ENCOUNTER — Inpatient Hospital Stay (HOSPITAL_COMMUNITY): Payer: Medicare Other | Admitting: Certified Registered Nurse Anesthetist

## 2020-02-21 ENCOUNTER — Encounter (HOSPITAL_COMMUNITY): Payer: Self-pay | Admitting: Internal Medicine

## 2020-02-21 DIAGNOSIS — K6289 Other specified diseases of anus and rectum: Secondary | ICD-10-CM

## 2020-02-21 DIAGNOSIS — K633 Ulcer of intestine: Secondary | ICD-10-CM

## 2020-02-21 HISTORY — PX: BIOPSY: SHX5522

## 2020-02-21 HISTORY — PX: COLONOSCOPY WITH PROPOFOL: SHX5780

## 2020-02-21 LAB — CBC
HCT: 30.2 % — ABNORMAL LOW (ref 36.0–46.0)
Hemoglobin: 10.1 g/dL — ABNORMAL LOW (ref 12.0–15.0)
MCH: 29.4 pg (ref 26.0–34.0)
MCHC: 33.4 g/dL (ref 30.0–36.0)
MCV: 88 fL (ref 80.0–100.0)
Platelets: 197 10*3/uL (ref 150–400)
RBC: 3.43 MIL/uL — ABNORMAL LOW (ref 3.87–5.11)
RDW: 13.8 % (ref 11.5–15.5)
WBC: 5.3 10*3/uL (ref 4.0–10.5)
nRBC: 0 % (ref 0.0–0.2)

## 2020-02-21 LAB — BASIC METABOLIC PANEL
Anion gap: 11 (ref 5–15)
BUN: 10 mg/dL (ref 8–23)
CO2: 23 mmol/L (ref 22–32)
Calcium: 8.8 mg/dL — ABNORMAL LOW (ref 8.9–10.3)
Chloride: 106 mmol/L (ref 98–111)
Creatinine, Ser: 0.85 mg/dL (ref 0.44–1.00)
GFR, Estimated: >60 mL/min (ref >60)
Glucose, Bld: 108 mg/dL — ABNORMAL HIGH (ref 70–99)
Potassium: 4 mmol/L (ref 3.5–5.1)
Sodium: 140 mmol/L (ref 135–145)

## 2020-02-21 LAB — MAGNESIUM: Magnesium: 1.9 mg/dL (ref 1.7–2.4)

## 2020-02-21 SURGERY — COLONOSCOPY WITH PROPOFOL
Anesthesia: Monitor Anesthesia Care

## 2020-02-21 MED ORDER — PROPOFOL 500 MG/50ML IV EMUL
INTRAVENOUS | Status: DC | PRN
  Administered 2020-02-21: 135 ug/kg/min via INTRAVENOUS
  Administered 2020-02-21: 100 ug/kg/min via INTRAVENOUS

## 2020-02-21 MED ORDER — LIP MEDEX EX OINT
TOPICAL_OINTMENT | CUTANEOUS | Status: DC | PRN
  Filled 2020-02-21: qty 7

## 2020-02-21 MED ORDER — LACTATED RINGERS IV SOLN: INTRAVENOUS | Status: DC | PRN

## 2020-02-21 MED ORDER — PROPOFOL 10 MG/ML IV BOLUS
INTRAVENOUS | Status: DC | PRN
  Administered 2020-02-21 (×2): 20 mg via INTRAVENOUS

## 2020-02-21 SURGICAL SUPPLY — 22 items

## 2020-02-21 NOTE — Interval H&P Note (Signed)
History and Physical Interval Note:  02/21/2020 9:19 AM  Monique Gonzalez  has presented today for surgery, with the diagnosis of History colonic stricture treated with surgery in past.  Now with abdominal pain, obstipation.  Rule out recurrent ischemia and/or stricture..  The various methods of treatment have been discussed with the patient and family. After consideration of risks, benefits and other options for treatment, the patient has consented to  Procedure(s): COLONOSCOPY WITH PROPOFOL (N/A) as a surgical intervention.  The patient's history has been reviewed, patient examined, no change in status, stable for surgery.  I have reviewed the patient's chart and labs.  Questions were answered to the patient's satisfaction.     Silvano Rusk

## 2020-02-21 NOTE — Anesthesia Preprocedure Evaluation (Signed)
Anesthesia Evaluation  Patient identified by MRN, date of birth, ID band Patient awake    Reviewed: Allergy & Precautions, H&P , NPO status , Patient's Chart, lab work & pertinent test results  Airway Mallampati: II   Neck ROM: full    Dental   Pulmonary former smoker,    breath sounds clear to auscultation       Cardiovascular negative cardio ROS   Rhythm:regular Rate:Normal     Neuro/Psych PSYCHIATRIC DISORDERS Anxiety Depression    GI/Hepatic Colonic stricture   Endo/Other  Morbid obesity  Renal/GU      Musculoskeletal  (+) Arthritis ,   Abdominal   Peds  Hematology   Anesthesia Other Findings   Reproductive/Obstetrics                             Anesthesia Physical Anesthesia Plan  ASA: II  Anesthesia Plan: MAC   Post-op Pain Management:    Induction: Intravenous  PONV Risk Score and Plan: 2 and Propofol infusion and Treatment may vary due to age or medical condition  Airway Management Planned: Simple Face Mask  Additional Equipment:   Intra-op Plan:   Post-operative Plan:   Informed Consent: I have reviewed the patients History and Physical, chart, labs and discussed the procedure including the risks, benefits and alternatives for the proposed anesthesia with the patient or authorized representative who has indicated his/her understanding and acceptance.       Plan Discussed with: CRNA, Anesthesiologist and Surgeon  Anesthesia Plan Comments:         Anesthesia Quick Evaluation

## 2020-02-21 NOTE — Anesthesia Procedure Notes (Signed)
Procedure Name: MAC Date/Time: 02/21/2020 9:45 AM Performed by: Harden Mo, CRNA Pre-anesthesia Checklist: Patient identified, Emergency Drugs available, Suction available and Patient being monitored Patient Re-evaluated:Patient Re-evaluated prior to induction Oxygen Delivery Method: Simple face mask Preoxygenation: Pre-oxygenation with 100% oxygen Induction Type: IV induction Placement Confirmation: positive ETCO2 and breath sounds checked- equal and bilateral Dental Injury: Teeth and Oropharynx as per pre-operative assessment

## 2020-02-21 NOTE — Transfer of Care (Signed)
Immediate Anesthesia Transfer of Care Note  Patient: Monique Gonzalez  Procedure(s) Performed: COLONOSCOPY WITH PROPOFOL (N/A ) BIOPSY  Patient Location: Endoscopy Unit  Anesthesia Type:MAC  Level of Consciousness: awake and alert   Airway & Oxygen Therapy: Patient Spontanous Breathing  Post-op Assessment: Report given to RN and Post -op Vital signs reviewed and stable  Post vital signs: Reviewed and stable  Last Vitals:  Vitals Value Taken Time  BP 102/83 02/21/20 1013  Temp    Pulse 89 02/21/20 1014  Resp 28 02/21/20 1013  SpO2 100 % 02/21/20 1014  Vitals shown include unvalidated device data.  Last Pain:  Vitals:   02/21/20 0853  TempSrc: Oral  PainSc: 0-No pain         Complications: No complications documented.

## 2020-02-21 NOTE — Plan of Care (Signed)
Pt understanding of plan of care 

## 2020-02-21 NOTE — Op Note (Addendum)
[facility] Patient Name: Monique Gonzalez Procedure Date : 02/21/2020 MRN: [id number] Attending MD: Gatha Mayer , MD Date of Birth: 1955/12/25 CSN: [id number] Age: 64 Admit Type: Inpatient Procedure:                Colonoscopy Indications:              Abnormal CT of the GI tract, Suspected colitis Providers:                Gatha Mayer, MD, Kary Kos RN, RN, Fransico Setters                            [place], Technician Referring MD:              Medicines:                Propofol per Anesthesia, Monitored Anesthesia Care Complications:            No immediate complications. Estimated Blood Loss:     Estimated blood loss was minimal. Procedure:                Pre-Anesthesia Assessment:                           - Prior to the procedure, a History and Physical                            was performed, and patient medications and                            allergies were reviewed. The patient's tolerance of                            previous anesthesia was also reviewed. The risks                            and benefits of the procedure and the sedation                            options and risks were discussed with the patient.                            All questions were answered, and informed consent                            was obtained. Prior Anticoagulants: The patient                            last took Lovenox (enoxaparin) 1 day prior to the                            procedure. ASA Grade Assessment: II - A patient                            with mild systemic disease. After reviewing the  risks and benefits, the patient was deemed in                            satisfactory condition to undergo the procedure.                           After obtaining informed consent, the colonoscope                            was passed under direct vision. Throughout the                            procedure, the patient's blood pressure, pulse, and                             oxygen saturations were monitored continuously. The                            PCF-H190DL ([id number]) Olympus pediatric colonoscope                            was introduced through the anus with the intention                            of advancing to the cecum. The scope was advanced                            to the transverse colon before the procedure was                            aborted. Medications were given. The colonoscopy                            was performed with difficulty due to poor                            endoscopic visualization. The patient tolerated the                            procedure well. The quality of the bowel                            preparation was poor. Scope In: 9:53:53 AM Scope Out: 70:62:37 AM Total Procedure Duration: 0 hours 12 minutes 13 seconds  Findings:      The perianal examination was normal.      The digital rectal exam findings include decreased sphincter tone.      Discontinuous areas of nonbleeding ulcerated mucosa with no stigmata of       recent bleeding were present in the sigmoid colon. Edema and stenosis       also. Biopsies were taken with a cold forceps for histology.       Verification of patient identification for the specimen was done.       Estimated blood loss was minimal.      Anal  papilla(e) were hypertrophied.      The exam was otherwise without abnormality on direct and retroflexion       views. Impression:               - Preparation of the colon was poor. Stool                            throughout - liquid and some semi-solid procedure                            aborted in transverese colon                           - Decreased sphincter tone found on digital rectal                            exam.                           - Mucosal ulceration. Biopsied. MULTIPLE ROUND                            SUPERFICIAL ULCERS 30-50 CM - ASSOCIATED EDEMATOUS                            AND STENOTIC  COLON ALSO - IBD VS ISCHEMIA FIRST TWO                            ON DIFFERENTIAL I THINK, INFECTION SEEMS UNLIKELY                            AS DOES DIVERTICULITIS                           - Anal papilla(e) were hypertrophied.                           - The examination was otherwise normal on direct                            and retroflexion views. I did not ID diverticula                            but prep and edema may obscure Recommendation:           - Patient has a contact number available for                            emergencies. The signs and symptoms of potential                            delayed complications were discussed with the                            patient. Return to normal activities tomorrow.  Written discharge instructions were provided to the                            patient.                           - Advance diet as tolerated.                           - Continue present medications.                           - Await pathology results.                           - After pathology review consider further                            evaluation of arterial supply to colon - CT-A?                           I did look at CT she had and did not see any                            evidence of vascular stenoses but would have                            radiology go over it as well. Procedure Code(s):        --- Professional ---                           [phone], 85, Colonoscopy, flexible; with biopsy,                            single or multiple Diagnosis Code(s):        --- Professional ---                           K62.89, Other specified diseases of anus and rectum                           K63.3, Ulcer of intestine                           R93.3, Abnormal findings on diagnostic imaging of                            other parts of digestive tract CPT copyright 2019 American Medical Association. All rights reserved. The codes  documented in this report are preliminary and upon coder review may  be revised to meet current compliance requirements. Gatha Mayer, MD 02/21/2020 10:23:12 AM This report has been signed electronically. Number of Addenda: 0

## 2020-02-21 NOTE — Progress Notes (Signed)
PROGRESS NOTE    Monique Gonzalez  AGT:[id number] DOB: 14-Jul-1955 DOA: 02/19/2020 PCP: Susy Frizzle, MD    Brief Narrative:  Monique Gonzalez is a 64 year old female with past medical history significant for bowel stricture secondary to ischemia status post partial colectomy in 2015, GERD, hyperlipidemia, depression/anxiety, obesity who presents to the ED with abdominal discomfort associated with nausea and constipation. Patient reports last bowel movement 02/14/2020; and reports usually has a bowel movement every other day. Has been taking increased laxatives at home without effect. Denies fever or chills.  In the ED, temperature 98.3, HR 104, RR 20, BP 125/84, SPO2 100% on room air. WBC count 10.2, hemoglobin 13.4, platelets 288. Sodium 136, potassium 4.5, chloride 100, CO2 23, glucose 138, BUN 14, creatinine 1.09, lipase 25, AST 16, ALT 20, total bilirubin 0.9. Urinalysis with trace leukoesterase, negative nitrite, few bacteria, 6/10 WBCs. SARS-CoV-2/influenza A/B PCR negative. CT abdomen/pelvis with contrast with large stool burden throughout the colon with inflammation/stranding around the sigmoid colon which is rather diffuse consistent with diverticulitis versus infectious/inflammatory colitis. Given her significant pain associate with nausea/vomiting, hospital service was consulted for admission for further evaluation and management.   Assessment & Plan:   Principal Problem:   Colitis Active Problems:   Constipation   Severe constipation Hx sigmoid stricture status post partial colectomy 2015 Patient presenting with progressive abdominal pain associate with nausea/vomiting. CT abdomen/pelvis with significant stool burden throughout the colon. History of sigmoid stricture requiring partial colectomy in 2015 likely secondary to ischemia. Colonoscopy 02/21/2020 by Dr. Carlean Purl with findings of poor preparation with aborted procedure transverse colon, mucosal ulcerations status post biopsies  concerning for IBD versus ischemia; less likely infectious. --Full liquid diet, advance as tolerated --Senna 2 tablets p.o. twice daily --MiraLAX twice daily --Pending pathology review of biopsies, consideration of further evaluation of arterial supply of the colon with CT angiogram per GI --Await further GI recommendations --Monitor bowel movements closely  Colitis CT abdomen/pelvis with inflammation/stranding surrounding the sigmoid colon consistent with diverticulitis versus infectious versus inflammatory colitis. Current colonoscopy as above, less likely infectious per GI --Discontinue ceftriaxone/Flagyl today --Monitor CBC daily  Hyperlipidemia --Atorvastatin 40 mg p.o. daily  Depression/anxiety: Citalopram 20 mg p.o. daily  GERD: Protonix 40 mg p.o. daily   DVT prophylaxis: Lovenox Code Status: Full code Family Communication: No family present at bedside  Disposition Plan:  Status is: Inpatient  Remains inpatient appropriate because:Ongoing active pain requiring inpatient pain management, Unsafe d/c plan and Inpatient level of care appropriate due to severity of illness   Dispo:  Patient From: Home  Planned Disposition: Home  Expected discharge date: 02/22/2020  Medically stable for discharge: No    Consultants:   [facility]  Procedures:   Colonoscopy 02/21/2020, Dr. Carlean Purl  Antimicrobials:   Cipro 12/12 - 12/12 (stopped for possible allergic reaction per patient)  Ceftriaxone 12/12 - 12/14  Flagyl 12/12 - 12/14   Subjective: Patient seen and examined at bedside, resting comfortably. Abdominal cramping lower quadrant slightly improved. Does report small bowel movements while in the ER yesterday. Continues with some nausea and significant abdominal discomfort. Seen by GI this morning, plan for possible sigmoidoscopy versus colonoscopy; given history of previous colonic stricture. No other questions or concerns at this time. Denies headache, no  fever/chills/night sweats, no vomiting/diarrhea, no chest pain, no palpitations, no shortness of breath, no cough/congestion, no fatigue. No acute events overnight per nursing staff.  Objective: Vitals:   02/21/20 0853 02/21/20 1014 02/21/20 1024 02/21/20 1051  BP: (!) 189/85 102/83 (!) 158/75 (!) 166/88  Pulse: 87 88 79 83  Resp:  20 16 16   Temp: 98.7 F (37.1 C) 98.7 F (37.1 C)  97.7 F (36.5 C)  TempSrc: Oral Temporal  Oral  SpO2: 97% 100% 100% 100%  Weight: 136.1 kg     Height: 5\' 11"  (1.803 m)       Intake/Output Summary (Last 24 hours) at 02/21/2020 1114 Last data filed at 02/21/2020 0600 Gross per 24 hour  Intake 3333.51 ml  Output --  Net 3333.51 ml   Filed Weights   02/20/20 0109 02/21/20 0853  Weight: (!) 137.5 kg 136.1 kg    Examination:  General exam: Appears calm and comfortable  Respiratory system: Clear to auscultation. Respiratory effort normal. On room air Cardiovascular system: S1 & S2 heard, RRR. No JVD, murmurs, rubs, gallops or clicks. No pedal edema. Gastrointestinal system: Abdomen is nondistended, soft with mild generalized tenderness. No organomegaly or masses felt. Normal bowel sounds heard. Central nervous system: Alert and oriented. No focal neurological deficits. Extremities: Symmetric 5 x 5 power. Skin: No rashes, lesions or ulcers Psychiatry: Judgement and insight appear normal. Mood & affect appropriate.     Data Reviewed: I have personally reviewed following labs and imaging studies  CBC: Recent Labs  Lab 02/19/20 1104 02/20/20 0317 02/21/20 0249  WBC 10.2 7.7 5.3  HGB 13.4 10.9* 10.1*  HCT 40.3 32.0* 30.2*  MCV 88.6 87.9 88.0  PLT 288 212 275   Basic Metabolic Panel: Recent Labs  Lab 02/19/20 1104 02/20/20 0317 02/21/20 0249  NA 136 135 140  K 4.5 4.2 4.0  CL 100 102 106  CO2 23 24 23   GLUCOSE 138* 116* 108*  BUN 14 14 10   CREATININE 1.09* 0.81 0.85  CALCIUM 9.2 8.6* 8.8*  MG  --   --  1.9   GFR: Estimated  Creatinine Clearance: 99.6 mL/min (by C-G formula based on SCr of 0.85 mg/dL). Liver Function Tests: Recent Labs  Lab 02/19/20 1104  AST 16  ALT 20  ALKPHOS 65  BILITOT 0.9  PROT 6.9  ALBUMIN 3.6   Recent Labs  Lab 02/19/20 1104  LIPASE 25   No results for input(s): AMMONIA in the last 168 hours. Coagulation Profile: No results for input(s): INR, PROTIME in the last 168 hours. Cardiac Enzymes: No results for input(s): CKTOTAL, CKMB, CKMBINDEX, TROPONINI in the last 168 hours. BNP (last 3 results) No results for input(s): PROBNP in the last 8760 hours. HbA1C: No results for input(s): HGBA1C in the last 72 hours. CBG: No results for input(s): GLUCAP in the last 168 hours. Lipid Profile: No results for input(s): CHOL, HDL, LDLCALC, TRIG, CHOLHDL, LDLDIRECT in the last 72 hours. Thyroid Function Tests: No results for input(s): TSH, T4TOTAL, FREET4, T3FREE, THYROIDAB in the last 72 hours. Anemia Panel: No results for input(s): VITAMINB12, FOLATE, FERRITIN, TIBC, IRON, RETICCTPCT in the last 72 hours. Sepsis Labs: No results for input(s): PROCALCITON, LATICACIDVEN in the last 168 hours.  Recent Results (from the past 240 hour(s))  Resp Panel by RT-PCR (Flu A&B, Covid) Nasopharyngeal Swab     Status: None   Collection Time: 02/19/20  8:07 PM   Specimen: Nasopharyngeal Swab; Nasopharyngeal(NP) swabs in vial transport medium  Result Value Ref Range Status   SARS Coronavirus 2 by RT PCR NEGATIVE NEGATIVE Final    Comment: (NOTE) SARS-CoV-2 target nucleic acids are NOT DETECTED.  The SARS-CoV-2 RNA is generally detectable in upper respiratory specimens during the  acute phase of infection. The lowest concentration of SARS-CoV-2 viral copies this assay can detect is 138 copies/mL. A negative result does not preclude SARS-Cov-2 infection and should not be used as the sole basis for treatment or other patient management decisions. A negative result may occur with  improper  specimen collection/handling, submission of specimen other than nasopharyngeal swab, presence of viral mutation(s) within the areas targeted by this assay, and inadequate number of viral copies(<138 copies/mL). A negative result must be combined with clinical observations, patient history, and epidemiological information. The expected result is Negative.  Fact Sheet for Patients:  EntrepreneurPulse.com.au  Fact Sheet for Healthcare Providers:  IncredibleEmployment.be  This test is no t yet approved or cleared by the [place] FDA and  has been authorized for detection and/or diagnosis of SARS-CoV-2 by FDA under an Emergency Use Authorization (EUA). This EUA will remain  in effect (meaning this test can be used) for the duration of the COVID-19 declaration under Section 564(b)(1) of the Act, 21 U.S.C.section 360bbb-3(b)(1), unless the authorization is terminated  or revoked sooner.       Influenza A by PCR NEGATIVE NEGATIVE Final   Influenza B by PCR NEGATIVE NEGATIVE Final    Comment: (NOTE) The Xpert Xpress SARS-CoV-2/FLU/RSV plus assay is intended as an aid in the diagnosis of influenza from Nasopharyngeal swab specimens and should not be used as a sole basis for treatment. Nasal washings and aspirates are unacceptable for Xpert Xpress SARS-CoV-2/FLU/RSV testing.  Fact Sheet for Patients: EntrepreneurPulse.com.au  Fact Sheet for Healthcare Providers: IncredibleEmployment.be  This test is not yet approved or cleared by the [place] FDA and has been authorized for detection and/or diagnosis of SARS-CoV-2 by FDA under an Emergency Use Authorization (EUA). This EUA will remain in effect (meaning this test can be used) for the duration of the COVID-19 declaration under Section 564(b)(1) of the Act, 21 U.S.C. section 360bbb-3(b)(1), unless the authorization is terminated or revoked.  Performed at  [facility], [place] [address], [place], [place] [address]          Radiology Studies: CT ABDOMEN PELVIS W CONTRAST  Result Date: 02/19/2020 CLINICAL DATA:  Left lower quadrant pain EXAM: CT ABDOMEN AND PELVIS WITH CONTRAST TECHNIQUE: Multidetector CT imaging of the abdomen and pelvis was performed using the standard protocol following bolus administration of intravenous contrast. CONTRAST:  194mL OMNIPAQUE IOHEXOL 300 MG/ML  SOLN COMPARISON:  01/14/2017 FINDINGS: Lower chest: Linear atelectasis in the lung bases.  No effusions. Hepatobiliary: No focal hepatic abnormality. Gallbladder unremarkable. Pancreas: No focal abnormality or ductal dilatation. Spleen: No focal abnormality.  Normal size. Adrenals/Urinary Tract: Left adrenal nodule measures 1.8 cm. Right adrenal gland unremarkable. Kidneys and urinary bladder unremarkable. Stomach/Bowel: Large stool burden throughout the colon. Postoperative changes in the distal descending colon. There is inflammation around the sigmoid colon. There are a few scattered diverticula. Inflammation appears more diffuse than diverticulitis, possibly colitis. Stomach and small bowel decompressed, unremarkable. Vascular/Lymphatic: No evidence of aneurysm or adenopathy. Reproductive: Insert female pelvis Other: No free fluid or free air. Musculoskeletal: No acute bony abnormality. IMPRESSION: Large stool burden throughout the colon. There is inflammation/stranding around the sigmoid colon which appears rather diffuse. This could reflect changes of diverticulitis or infectious/inflammatory colitis. Small nodule in the left adrenal gland, likely adenoma. Electronically Signed   By: Rolm Baptise M.D.   On: 02/19/2020 19:07        Scheduled Meds:  atorvastatin  40 mg Oral QHS   bisacodyl  10 mg Rectal Once   cholecalciferol  1,000 Units Oral Q breakfast   citalopram  20 mg Oral QHS   enoxaparin (LOVENOX) injection  65 mg Subcutaneous QHS   loratadine   10 mg Oral QHS   melatonin  10 mg Oral QHS   metoCLOPramide (REGLAN) injection  10 mg Intravenous Once   metroNIDAZOLE  500 mg Oral Q8H   omega-3 acid ethyl esters  1 g Oral Daily   pantoprazole  40 mg Oral QAC breakfast   polyethylene glycol  17 g Oral BID   senna-docusate  2 tablet Oral BID   Continuous Infusions:  cefTRIAXone (ROCEPHIN)  IV 2 g (02/20/20 2150)   lactated ringers 150 mL/hr at 02/21/20 0600     LOS: 1 day    Time spent: 36 minutes spent on chart review, discussion with nursing staff, consultants, updating family and interview/physical exam; more than 50% of that time was spent in counseling and/or coordination of care.    Reilynn Lauro J [place] DO [facility] Available via Epic secure chat 7am-7pm After these hours, please refer to coverage provider listed on [email or web address] 02/21/2020, 11:14 AM

## 2020-02-22 DIAGNOSIS — K5904 Chronic idiopathic constipation: Secondary | ICD-10-CM

## 2020-02-22 DIAGNOSIS — K551 Chronic vascular disorders of intestine: Secondary | ICD-10-CM

## 2020-02-22 LAB — BASIC METABOLIC PANEL
Anion gap: 9 (ref 5–15)
BUN: 6 mg/dL — ABNORMAL LOW (ref 8–23)
CO2: 28 mmol/L (ref 22–32)
Calcium: 8.9 mg/dL (ref 8.9–10.3)
Chloride: 104 mmol/L (ref 98–111)
Creatinine, Ser: 0.78 mg/dL (ref 0.44–1.00)
GFR, Estimated: >60 mL/min (ref >60)
Glucose, Bld: 107 mg/dL — ABNORMAL HIGH (ref 70–99)
Potassium: 3.7 mmol/L (ref 3.5–5.1)
Sodium: 141 mmol/L (ref 135–145)

## 2020-02-22 LAB — CBC
HCT: 31.4 % — ABNORMAL LOW (ref 36.0–46.0)
Hemoglobin: 10.3 g/dL — ABNORMAL LOW (ref 12.0–15.0)
MCH: 28.9 pg (ref 26.0–34.0)
MCHC: 32.8 g/dL (ref 30.0–36.0)
MCV: 88.2 fL (ref 80.0–100.0)
Platelets: 221 10*3/uL (ref 150–400)
RBC: 3.56 MIL/uL — ABNORMAL LOW (ref 3.87–5.11)
RDW: 13.7 % (ref 11.5–15.5)
WBC: 4.5 10*3/uL (ref 4.0–10.5)
nRBC: 0 % (ref 0.0–0.2)

## 2020-02-22 LAB — C-REACTIVE PROTEIN: CRP: 4.4 mg/dL — ABNORMAL HIGH (ref <1.0)

## 2020-02-22 LAB — MAGNESIUM: Magnesium: 1.8 mg/dL (ref 1.7–2.4)

## 2020-02-22 LAB — SURGICAL PATHOLOGY

## 2020-02-22 LAB — SEDIMENTATION RATE: Sed Rate: 60 mm/hr — ABNORMAL HIGH (ref 0–22)

## 2020-02-22 MED ORDER — POLYETHYLENE GLYCOL 3350 17 G PO PACK: 17.0000 g | PACK | Freq: Three times a day (TID) | ORAL | 0 refills | Status: DC

## 2020-02-22 MED ORDER — POLYETHYLENE GLYCOL 3350 17 G PO PACK
17.0000 g | PACK | Freq: Three times a day (TID) | ORAL | Status: DC
  Administered 2020-02-22: 17 g via ORAL
  Filled 2020-02-22: qty 1

## 2020-02-22 MED ORDER — DOCUSATE SODIUM 100 MG PO CAPS: 100.0000 mg | ORAL_CAPSULE | Freq: Two times a day (BID) | ORAL | 0 refills | Status: AC

## 2020-02-22 NOTE — Progress Notes (Signed)
Nsg Discharge Note  Admit Date:  02/19/2020 Discharge date: 02/22/2020   Monique Gonzalez to be D/C'd Home per MD order.  AVS completed.  Copy for chart, and copy for patient signed, and dated. Patient/caregiver able to verbalize understanding.  Discharge Medication: Allergies as of 02/22/2020      Reactions   Ciprofloxacin Itching, Dermatitis, Rash   Penicillins Rash, Other (See Comments)   Has patient had a PCN reaction causing immediate rash, facial/tongue/throat swelling, SOB or lightheadedness with hypotension: Yes Has patient had a PCN reaction causing severe rash involving mucus membranes or skin necrosis: Unknown Has patient had a PCN reaction that required hospitalization: No Has patient had a PCN reaction occurring within the last 10 years: No If all of the above answers are "NO", then may proceed with Cephalosporin use. Childhood reaction.   Sulfa Antibiotics Rash      Medication List    TAKE these medications   acetaminophen 650 MG CR tablet Commonly known as: TYLENOL Take 1,300 mg by mouth at bedtime.   atorvastatin 40 MG tablet Commonly known as: LIPITOR Take 1 tablet (40 mg total) by mouth daily. What changed: when to take this   CALCIUM 600+D3 PO Take 1 tablet by mouth in the morning and at bedtime.   citalopram 20 MG tablet Commonly known as: CELEXA Take 1 tablet (20 mg total) by mouth at bedtime.   cyclobenzaprine 10 MG tablet Commonly known as: FLEXERIL Take 1 tablet (10 mg total) by mouth 3 (three) times daily as needed for muscle spasms. What changed:   when to take this  additional instructions   docusate sodium 100 MG capsule Commonly known as: COLACE Take 1 capsule (100 mg total) by mouth 2 (two) times daily. What changed: when to take this   Fish Oil 1000 MG Caps Take 1,000 mg by mouth daily after breakfast.   fluticasone 50 MCG/ACT nasal spray Commonly known as: FLONASE Place 2 sprays into both nostrils daily as needed (for seasonal  allergies).   loratadine 10 MG tablet Commonly known as: CLARITIN Take 10 mg by mouth at bedtime.   Melatonin 10 MG Tabs Take 10 mg by mouth at bedtime.   naproxen sodium 220 MG tablet Commonly known as: ALEVE Take 220 mg by mouth 2 (two) times daily as needed (for pain).   ONE-A-DAY WOMENS PO Take 1 tablet by mouth daily with breakfast.   pantoprazole 40 MG tablet Commonly known as: PROTONIX Take 1 tablet (40 mg total) by mouth daily. What changed: when to take this   polyethylene glycol 17 g packet Commonly known as: MIRALAX / GLYCOLAX Take 17 g by mouth 3 (three) times daily.   Vitamin D-3 25 MCG (1000 UT) Caps Take 1,000 Units by mouth daily with breakfast.       Discharge Assessment: Vitals:   02/22/20 0534 02/22/20 1215  BP: 133/66 (!) 163/80  Pulse: 80 86  Resp: 18 16  Temp: 98.3 F (36.8 C) 97.6 F (36.4 C)  SpO2: 94% 96%   Skin clean, dry and intact without evidence of skin break down, no evidence of skin tears noted. IV catheter discontinued intact. Site without signs and symptoms of complications - no redness or edema noted at insertion site, patient denies c/o pain - only slight tenderness at site.  Dressing with slight pressure applied.  D/c Instructions-Education: Discharge instructions given to patient/family with verbalized understanding. D/c education completed with patient/family including follow up instructions, medication list, d/c activities limitations if indicated,  with other d/c instructions as indicated by MD - patient able to verbalize understanding, all questions fully answered. Patient instructed to return to ED, call 911, or call MD for any changes in condition.  Patient escorted via [place], and D/C home via private auto.  Erasmo Leventhal, RN 02/22/2020 4:00 PM

## 2020-02-22 NOTE — Progress Notes (Signed)
PROGRESS NOTE    Monique Gonzalez  [id number]:[id number] DOB: August 24, 1955 DOA: 02/19/2020 PCP: Susy Frizzle, MD    Brief Narrative:  Monique Gonzalez is a 64 year old female with past medical history significant for bowel stricture secondary to ischemia status post partial colectomy in 2015, GERD, hyperlipidemia, depression/anxiety, obesity who presents to the ED with abdominal discomfort associated with nausea and constipation. Patient presenting with progressive abdominal pain associate with nausea/vomiting. CT abdomen/pelvis with significant stool burden throughout the colon. History of sigmoid stricture requiring partial colectomy in 2015 likely secondary to ischemia. Colonoscopy 02/21/2020 by Dr. Carlean Purl with findings of poor preparation with aborted procedure transverse colon, mucosal ulcerations status post biopsies concerning for IBD versus ischemia; less likely infectious.   Assessment & Plan:   Principal Problem:   Colitis Active Problems:   Constipation   Severe constipation Hx sigmoid stricture status post partial colectomy 2015 Patient presenting with progressive abdominal pain associate with nausea/vomiting. CT abdomen/pelvis with significant stool burden throughout the colon. History of sigmoid stricture requiring partial colectomy in 2015 likely secondary to ischemia. Colonoscopy 02/21/2020 by Dr. Carlean Purl with findings of poor preparation with aborted procedure transverse colon, mucosal ulcerations status post biopsies concerning for IBD versus ischemia; less likely infectious. --Senna 2 tablets p.o. twice daily --MiraLAX twice daily --pathology review of biopsies: ischemic --Await further GI recommendations   Colitis CT abdomen/pelvis with inflammation/stranding surrounding the sigmoid colon consistent with diverticulitis versus infectious versus inflammatory colitis. Current colonoscopy as above, less likely infectious per GI --Discontinue ceftriaxone/Flagyl today --Monitor  CBC daily  Hyperlipidemia --Atorvastatin 40 mg p.o. daily  Depression/anxiety: Citalopram 20 mg p.o. daily  GERD: Protonix 40 mg p.o. daily   DVT prophylaxis: Lovenox Code Status: Full code Family Communication: twin sister at bedside  Disposition Plan:  Status is: Inpatient  Remains inpatient appropriate because:Ongoing active pain requiring inpatient pain management, Unsafe d/c plan and Inpatient level of care appropriate due to severity of illness   Dispo:  Patient From: Home  Planned Disposition: Home  Expected discharge date: 02/23/2020  Medically stable for discharge: No: await GI recommendations    Consultants:   [facility]  Procedures:   Colonoscopy 02/21/2020, Dr. Carlean Purl  Antimicrobials:   Cipro 12/12 - 12/12 (stopped for possible allergic reaction per patient)  Ceftriaxone 12/12 - 12/14  Flagyl 12/12 - 12/14   Subjective: Pain improved since admission  Objective: Vitals:   02/21/20 1657 02/21/20 2155 02/22/20 0534 02/22/20 1215  BP: (!) 164/89 (!) 157/69 133/66 (!) 163/80  Pulse: 79 80 80 86  Resp: 16 16 18 16   Temp: 98.5 F (36.9 C) 98.8 F (37.1 C) 98.3 F (36.8 C) 97.6 F (36.4 C)  TempSrc: Oral Oral Oral Oral  SpO2: 96% 96% 94% 96%  Weight:      Height:        Intake/Output Summary (Last 24 hours) at 02/22/2020 1413 Last data filed at 02/21/2020 2135 Gross per 24 hour  Intake 240 ml  Output --  Net 240 ml   Filed Weights   02/20/20 0109 02/21/20 0853  Weight: (!) 137.5 kg 136.1 kg    Examination:  General: Appearance:    Severely obese female in no acute distress   +BS, minimally tender  Lungs:     respirations unlabored  Heart:    Normal heart rate. Normal rhythm. No murmurs, rubs, or gallops.   MS:   All extremities are intact.   Neurologic:   Awake, alert, oriented x 3. No apparent focal neurological  defect.      Data Reviewed: I have personally reviewed following labs and imaging  studies  CBC: Recent Labs  Lab 02/19/20 1104 02/20/20 0317 02/21/20 0249 02/22/20 0356  WBC 10.2 7.7 5.3 4.5  HGB 13.4 10.9* 10.1* 10.3*  HCT 40.3 32.0* 30.2* 31.4*  MCV 88.6 87.9 88.0 88.2  PLT 288 212 197 053   Basic Metabolic Panel: Recent Labs  Lab 02/19/20 1104 02/20/20 0317 02/21/20 0249 02/22/20 0356  NA 136 135 140 141  K 4.5 4.2 4.0 3.7  CL 100 102 106 104  CO2 23 24 23 28   GLUCOSE 138* 116* 108* 107*  BUN 14 14 10  6*  CREATININE 1.09* 0.81 0.85 0.78  CALCIUM 9.2 8.6* 8.8* 8.9  MG  --   --  1.9 1.8   GFR: Estimated Creatinine Clearance: 105.8 mL/min (by C-G formula based on SCr of 0.78 mg/dL). Liver Function Tests: Recent Labs  Lab 02/19/20 1104  AST 16  ALT 20  ALKPHOS 65  BILITOT 0.9  PROT 6.9  ALBUMIN 3.6   Recent Labs  Lab 02/19/20 1104  LIPASE 25   No results for input(s): AMMONIA in the last 168 hours. Coagulation Profile: No results for input(s): INR, PROTIME in the last 168 hours. Cardiac Enzymes: No results for input(s): CKTOTAL, CKMB, CKMBINDEX, TROPONINI in the last 168 hours. BNP (last 3 results) No results for input(s): PROBNP in the last 8760 hours. HbA1C: No results for input(s): HGBA1C in the last 72 hours. CBG: No results for input(s): GLUCAP in the last 168 hours. Lipid Profile: No results for input(s): CHOL, HDL, LDLCALC, TRIG, CHOLHDL, LDLDIRECT in the last 72 hours. Thyroid Function Tests: No results for input(s): TSH, T4TOTAL, FREET4, T3FREE, THYROIDAB in the last 72 hours. Anemia Panel: No results for input(s): VITAMINB12, FOLATE, FERRITIN, TIBC, IRON, RETICCTPCT in the last 72 hours. Sepsis Labs: No results for input(s): PROCALCITON, LATICACIDVEN in the last 168 hours.  Recent Results (from the past 240 hour(s))  Resp Panel by RT-PCR (Flu A&B, Covid) Nasopharyngeal Swab     Status: None   Collection Time: 02/19/20  8:07 PM   Specimen: Nasopharyngeal Swab; Nasopharyngeal(NP) swabs in vial transport medium   Result Value Ref Range Status   SARS Coronavirus 2 by RT PCR NEGATIVE NEGATIVE Final    Comment: (NOTE) SARS-CoV-2 target nucleic acids are NOT DETECTED.  The SARS-CoV-2 RNA is generally detectable in upper respiratory specimens during the acute phase of infection. The lowest concentration of SARS-CoV-2 viral copies this assay can detect is 138 copies/mL. A negative result does not preclude SARS-Cov-2 infection and should not be used as the sole basis for treatment or other patient management decisions. A negative result may occur with  improper specimen collection/handling, submission of specimen other than nasopharyngeal swab, presence of viral mutation(s) within the areas targeted by this assay, and inadequate number of viral copies(<138 copies/mL). A negative result must be combined with clinical observations, patient history, and epidemiological information. The expected result is Negative.  Fact Sheet for Patients:  EntrepreneurPulse.com.au  Fact Sheet for Healthcare Providers:  IncredibleEmployment.be  This test is no t yet approved or cleared by the [place] FDA and  has been authorized for detection and/or diagnosis of SARS-CoV-2 by FDA under an Emergency Use Authorization (EUA). This EUA will remain  in effect (meaning this test can be used) for the duration of the COVID-19 declaration under Section 564(b)(1) of the Act, 21 U.S.C.section 360bbb-3(b)(1), unless the authorization is terminated  or revoked  sooner.       Influenza A by PCR NEGATIVE NEGATIVE Final   Influenza B by PCR NEGATIVE NEGATIVE Final    Comment: (NOTE) The Xpert Xpress SARS-CoV-2/FLU/RSV plus assay is intended as an aid in the diagnosis of influenza from Nasopharyngeal swab specimens and should not be used as a sole basis for treatment. Nasal washings and aspirates are unacceptable for Xpert Xpress SARS-CoV-2/FLU/RSV testing.  Fact Sheet for  Patients: EntrepreneurPulse.com.au  Fact Sheet for Healthcare Providers: IncredibleEmployment.be  This test is not yet approved or cleared by the [place] FDA and has been authorized for detection and/or diagnosis of SARS-CoV-2 by FDA under an Emergency Use Authorization (EUA). This EUA will remain in effect (meaning this test can be used) for the duration of the COVID-19 declaration under Section 564(b)(1) of the Act, 21 U.S.C. section 360bbb-3(b)(1), unless the authorization is terminated or revoked.  Performed at [facility], [place] [address], [place], [place] [address]          Radiology Studies: No results found.      Scheduled Meds: . atorvastatin  40 mg Oral QHS  . bisacodyl  10 mg Rectal Once  . cholecalciferol  1,000 Units Oral Q breakfast  . citalopram  20 mg Oral QHS  . enoxaparin (LOVENOX) injection  65 mg Subcutaneous QHS  . loratadine  10 mg Oral QHS  . melatonin  10 mg Oral QHS  . metoCLOPramide (REGLAN) injection  10 mg Intravenous Once  . omega-3 acid ethyl esters  1 g Oral Daily  . pantoprazole  40 mg Oral QAC breakfast  . polyethylene glycol  17 g Oral BID  . senna-docusate  2 tablet Oral BID   Continuous Infusions:    LOS: 2 days    Time spent: 36 minutes spent on chart review, discussion with nursing staff, consultants, updating family and interview/physical exam; more than 50% of that time was spent in counseling and/or coordination of care.    Geradine Girt, DO [facility] Available via Epic secure chat 7am-7pm After these hours, please refer to coverage provider listed on [email or web address] 02/22/2020, 2:13 PM

## 2020-02-22 NOTE — Anesthesia Postprocedure Evaluation (Signed)
Anesthesia Post Note  Patient: Monique Gonzalez  Procedure(s) Performed: COLONOSCOPY WITH PROPOFOL (N/A ) BIOPSY     Patient location during evaluation: Endoscopy Anesthesia Type: MAC Level of consciousness: awake and alert Pain management: pain level controlled Vital Signs Assessment: post-procedure vital signs reviewed and stable Respiratory status: spontaneous breathing, nonlabored ventilation, respiratory function stable and patient connected to nasal cannula oxygen Cardiovascular status: blood pressure returned to baseline and stable Postop Assessment: no apparent nausea or vomiting Anesthetic complications: no   No complications documented.  Last Vitals:  Vitals:   02/21/20 2155 02/22/20 0534  BP: (!) 157/69 133/66  Pulse: 80 80  Resp: 16 18  Temp: 37.1 C 36.8 C  SpO2: 96% 94%    Last Pain:  Vitals:   02/22/20 0534  TempSrc: Oral  PainSc:    Pain Goal:                   Hollee Fate S

## 2020-02-22 NOTE — Discharge Summary (Signed)
Physician Discharge Summary  Monique Gonzalez UYQ:[id number] DOB: [birth year] DOA: 02/19/2020  PCP: Susy Frizzle, MD  Admit date: 02/19/2020 Discharge date: 02/22/2020  Admitted From: home Discharge disposition: home   Recommendations for Outpatient Follow-Up:   1. Close GI follow up 2. Bowel regimen: Use MiraLAX 1 capful 2-3 times daily and add Benefiber or soluble fiber 1 tablespoon 3 times daily with meals   Discharge Diagnosis:   Principal Problem:   Colitis Active Problems:   Constipation    Discharge Condition: Improved.  Diet recommendation: low residue Wound care: None.  Code status: Full.   History of Present Illness:   Monique Gonzalez is a 64 y.o. female with medical history significant of obesity, bowel stricture and partial colectomy.  Pt presents to the ED with c/o abd pain, nausea, constipation.  Symptoms onset Friday, worsening throughout day yesterday and today.  Last BM on Tuesday.  Tried taking multiple laxatives without results, still passing gas.  Pain is cramping and aching and worse on left side.  No fevers, chills.   Hospital Course by Problem:    Severe constipation Hx sigmoid stricture status post partial colectomy 2015 Patient presenting with progressive abdominal pain associate with nausea/vomiting. CT abdomen/pelvis with significant stool burden throughout the colon. History of sigmoid stricture requiring partial colectomy in 2015 likely secondary to ischemia. Colonoscopy 02/21/2020 by Dr. Carlean Purl with findings of poor preparation with aborted procedure transverse colon, mucosal ulcerations status post biopsies concerning for IBD versus ischemia; less likely infectious. --Senna 2 tablets p.o. twice daily --MiraLAX twice daily --pathology review of biopsies: ischemic --GI recommendations:Biopsies consistent with ischemic injury Discussed bowel regimen to prevent constipation. Use MiraLAX 1 capful 2-3 times daily and add  Benefiber or soluble fiber 1 tablespoon 3 times daily with meals Low residue diet Increase water intake   Colitis CT abdomen/pelvis with inflammation/stranding surrounding the sigmoid colon consistent with diverticulitis versus infectious versus inflammatory colitis. Current colonoscopy as above, less likely infectious per GI --Discontinue ceftriaxone/Flagyl  --Monitor CBC daily  Hyperlipidemia --Atorvastatin 40 mg p.o. daily  Depression/anxiety: Citalopram 20 mg p.o. daily  GERD: Protonix 40 mg p.o. daily    Medical Consultants:   GI   Discharge Exam:   Vitals:   02/22/20 0534 02/22/20 1215  BP: 133/66 (!) 163/80  Pulse: 80 86  Resp: 18 16  Temp: 98.3 F (36.8 C) 97.6 F (36.4 C)  SpO2: 94% 96%   Vitals:   02/21/20 1657 02/21/20 2155 02/22/20 0534 02/22/20 1215  BP: (!) 164/89 (!) 157/69 133/66 (!) 163/80  Pulse: 79 80 80 86  Resp: 16 16 18 16   Temp: 98.5 F (36.9 C) 98.8 F (37.1 C) 98.3 F (36.8 C) 97.6 F (36.4 C)  TempSrc: Oral Oral Oral Oral  SpO2: 96% 96% 94% 96%  Weight:      Height:        General exam: Appears calm and comfortable. .    The results of significant diagnostics from this hospitalization (including imaging, microbiology, ancillary and laboratory) are listed below for reference.     Procedures and Diagnostic Studies:   CT ABDOMEN PELVIS W CONTRAST  Result Date: 02/19/2020 CLINICAL DATA:  Left lower quadrant pain EXAM: CT ABDOMEN AND PELVIS WITH CONTRAST TECHNIQUE: Multidetector CT imaging of the abdomen and pelvis was performed using the standard protocol following bolus administration of intravenous contrast. CONTRAST:  130mL OMNIPAQUE IOHEXOL 300 MG/ML  SOLN COMPARISON:  01/14/2017 FINDINGS: Lower chest: Linear atelectasis in the  lung bases.  No effusions. Hepatobiliary: No focal hepatic abnormality. Gallbladder unremarkable. Pancreas: No focal abnormality or ductal dilatation. Spleen: No focal abnormality.  Normal size.  Adrenals/Urinary Tract: Left adrenal nodule measures 1.8 cm. Right adrenal gland unremarkable. Kidneys and urinary bladder unremarkable. Stomach/Bowel: Large stool burden throughout the colon. Postoperative changes in the distal descending colon. There is inflammation around the sigmoid colon. There are a few scattered diverticula. Inflammation appears more diffuse than diverticulitis, possibly colitis. Stomach and small bowel decompressed, unremarkable. Vascular/Lymphatic: No evidence of aneurysm or adenopathy. Reproductive: Insert female pelvis Other: No free fluid or free air. Musculoskeletal: No acute bony abnormality. IMPRESSION: Large stool burden throughout the colon. There is inflammation/stranding around the sigmoid colon which appears rather diffuse. This could reflect changes of diverticulitis or infectious/inflammatory colitis. Small nodule in the left adrenal gland, likely adenoma. Electronically Signed   By: Rolm Baptise M.D.   On: 02/19/2020 19:07     Labs:   Basic Metabolic Panel: Recent Labs  Lab 02/19/20 1104 02/20/20 0317 02/21/20 0249 02/22/20 0356  NA 136 135 140 141  K 4.5 4.2 4.0 3.7  CL 100 102 106 104  CO2 23 24 23 28   GLUCOSE 138* 116* 108* 107*  BUN 14 14 10  6*  CREATININE 1.09* 0.81 0.85 0.78  CALCIUM 9.2 8.6* 8.8* 8.9  MG  --   --  1.9 1.8   GFR Estimated Creatinine Clearance: 105.8 mL/min (by C-G formula based on SCr of 0.78 mg/dL). Liver Function Tests: Recent Labs  Lab 02/19/20 1104  AST 16  ALT 20  ALKPHOS 65  BILITOT 0.9  PROT 6.9  ALBUMIN 3.6   Recent Labs  Lab 02/19/20 1104  LIPASE 25   No results for input(s): AMMONIA in the last 168 hours. Coagulation profile No results for input(s): INR, PROTIME in the last 168 hours.  CBC: Recent Labs  Lab 02/19/20 1104 02/20/20 0317 02/21/20 0249 02/22/20 0356  WBC 10.2 7.7 5.3 4.5  HGB 13.4 10.9* 10.1* 10.3*  HCT 40.3 32.0* 30.2* 31.4*  MCV 88.6 87.9 88.0 88.2  PLT 288 212 197 221    Cardiac Enzymes: No results for input(s): CKTOTAL, CKMB, CKMBINDEX, TROPONINI in the last 168 hours. BNP: Invalid input(s): POCBNP CBG: No results for input(s): GLUCAP in the last 168 hours. D-Dimer No results for input(s): DDIMER in the last 72 hours. Hgb A1c No results for input(s): HGBA1C in the last 72 hours. Lipid Profile No results for input(s): CHOL, HDL, LDLCALC, TRIG, CHOLHDL, LDLDIRECT in the last 72 hours. Thyroid function studies No results for input(s): TSH, T4TOTAL, T3FREE, THYROIDAB in the last 72 hours.  Invalid input(s): FREET3 Anemia work up No results for input(s): VITAMINB12, FOLATE, FERRITIN, TIBC, IRON, RETICCTPCT in the last 72 hours. Microbiology Recent Results (from the past 240 hour(s))  Resp Panel by RT-PCR (Flu A&B, Covid) Nasopharyngeal Swab     Status: None   Collection Time: 02/19/20  8:07 PM   Specimen: Nasopharyngeal Swab; Nasopharyngeal(NP) swabs in vial transport medium  Result Value Ref Range Status   SARS Coronavirus 2 by RT PCR NEGATIVE NEGATIVE Final    Comment: (NOTE) SARS-CoV-2 target nucleic acids are NOT DETECTED.  The SARS-CoV-2 RNA is generally detectable in upper respiratory specimens during the acute phase of infection. The lowest concentration of SARS-CoV-2 viral copies this assay can detect is 138 copies/mL. A negative result does not preclude SARS-Cov-2 infection and should not be used as the sole basis for treatment or other patient management decisions.  A negative result may occur with  improper specimen collection/handling, submission of specimen other than nasopharyngeal swab, presence of viral mutation(s) within the areas targeted by this assay, and inadequate number of viral copies(<138 copies/mL). A negative result must be combined with clinical observations, patient history, and epidemiological information. The expected result is Negative.  Fact Sheet for Patients:   EntrepreneurPulse.com.au  Fact Sheet for Healthcare Providers:  IncredibleEmployment.be  This test is no t yet approved or cleared by the [place] FDA and  has been authorized for detection and/or diagnosis of SARS-CoV-2 by FDA under an Emergency Use Authorization (EUA). This EUA will remain  in effect (meaning this test can be used) for the duration of the COVID-19 declaration under Section 564(b)(1) of the Act, 21 U.S.C.section 360bbb-3(b)(1), unless the authorization is terminated  or revoked sooner.       Influenza A by PCR NEGATIVE NEGATIVE Final   Influenza B by PCR NEGATIVE NEGATIVE Final    Comment: (NOTE) The Xpert Xpress SARS-CoV-2/FLU/RSV plus assay is intended as an aid in the diagnosis of influenza from Nasopharyngeal swab specimens and should not be used as a sole basis for treatment. Nasal washings and aspirates are unacceptable for Xpert Xpress SARS-CoV-2/FLU/RSV testing.  Fact Sheet for Patients: EntrepreneurPulse.com.au  Fact Sheet for Healthcare Providers: IncredibleEmployment.be  This test is not yet approved or cleared by the [place] FDA and has been authorized for detection and/or diagnosis of SARS-CoV-2 by FDA under an Emergency Use Authorization (EUA). This EUA will remain in effect (meaning this test can be used) for the duration of the COVID-19 declaration under Section 564(b)(1) of the Act, 21 U.S.C. section 360bbb-3(b)(1), unless the authorization is terminated or revoked.  Performed at [facility], [place] [address] [place], [place] [address]      Discharge Instructions:   Discharge Instructions    Diet - low sodium heart healthy   Complete by: As directed    Discharge instructions   Complete by: As directed    MiraLAX 2-3 times a day.  Low residue diet   Increase activity slowly   Complete by: As directed      Allergies as of 02/22/2020       Reactions   Ciprofloxacin Itching, Dermatitis, Rash   Penicillins Rash, Other (See Comments)   Has patient had a PCN reaction causing immediate rash, facial/tongue/throat swelling, SOB or lightheadedness with hypotension: Yes Has patient had a PCN reaction causing severe rash involving mucus membranes or skin necrosis: Unknown Has patient had a PCN reaction that required hospitalization: No Has patient had a PCN reaction occurring within the last 10 years: No If all of the above answers are "NO", then may proceed with Cephalosporin use. Childhood reaction.   Sulfa Antibiotics Rash      Medication List    TAKE these medications   acetaminophen 650 MG CR tablet Commonly known as: TYLENOL Take 1,300 mg by mouth at bedtime.   atorvastatin 40 MG tablet Commonly known as: LIPITOR Take 1 tablet (40 mg total) by mouth daily. What changed: when to take this   CALCIUM 600+D3 PO Take 1 tablet by mouth in the morning and at bedtime.   citalopram 20 MG tablet Commonly known as: CELEXA Take 1 tablet (20 mg total) by mouth at bedtime.   cyclobenzaprine 10 MG tablet Commonly known as: FLEXERIL Take 1 tablet (10 mg total) by mouth 3 (three) times daily as needed for muscle spasms. What changed:   when to take  this  additional instructions   docusate sodium 100 MG capsule Commonly known as: COLACE Take 1 capsule (100 mg total) by mouth 2 (two) times daily. What changed: when to take this   Fish Oil 1000 MG Caps Take 1,000 mg by mouth daily after breakfast.   fluticasone 50 MCG/ACT nasal spray Commonly known as: FLONASE Place 2 sprays into both nostrils daily as needed (for seasonal allergies).   loratadine 10 MG tablet Commonly known as: CLARITIN Take 10 mg by mouth at bedtime.   Melatonin 10 MG Tabs Take 10 mg by mouth at bedtime.   naproxen sodium 220 MG tablet Commonly known as: ALEVE Take 220 mg by mouth 2 (two) times daily as needed (for pain).   ONE-A-DAY WOMENS  PO Take 1 tablet by mouth daily with breakfast.   pantoprazole 40 MG tablet Commonly known as: PROTONIX Take 1 tablet (40 mg total) by mouth daily. What changed: when to take this   polyethylene glycol 17 g packet Commonly known as: MIRALAX / GLYCOLAX Take 17 g by mouth 3 (three) times daily.   Vitamin D-3 25 MCG (1000 UT) Caps Take 1,000 Units by mouth daily with breakfast.       Follow-up Information    Levin Erp, PA Follow up on 03/22/2020.   Specialty: Gastroenterology Why: 10 AM follow up with GI, PA for Dr Ardis Hughs.   Contact information: [address] [place] [address] [phone]                Time coordinating discharge: 35 min  Signed:  Geradine Girt DO  [facility] 02/22/2020, 3:41 PM

## 2020-02-22 NOTE — Discharge Instructions (Signed)
Foods Allowed on a Low Residue Diet refined grain products like white breads, cereals, and pastas (look for less than 2g of fibre per serving on label) white rice juices without pulp or seeds meats, fish, and eggs oil, margarine, butter, mayonnaise, and salad dressings fruit without peels or seeds and certain canned or well-cooked fruit (e.g., peeled apples, seedless peeled grapes, banana, cantaloupe, etc.) some soft, cooked vegetables (e.g., beets, beans, carrots, cucumber, eggplant, mushrooms, etc.) limit of 2 cups/day: milk, yogurt, puddings, cream based soups  Foods to Avoid on a Low Residue Diet whole grain breads, cereals, and pastas (e.g., oatmeal, millet, buckwheat, flax, popcorn) raw vegetables the following vegetables, whether cooked or raw: broccoli, cauliflower, Brussels sprouts, cabbage, kale, Swiss chard dried fruit, berries, and other fruit with skin or seeds tough meats with gristle crunchy peanut butter (smooth is okay) seeds and nuts dried beans, peas, and lentils  Limitations of the Low Residue Diet The LRD may be beneficial for symptom management during heightened or acute episodes of increased abdominal pain, infection, or inflammation.

## 2020-02-22 NOTE — Progress Notes (Addendum)
Daily Rounding Note  02/22/2020, 2:43 PM  LOS: 2 days   SUBJECTIVE:   Chief complaint: Constipation, abdominal pain, recurrent ischemic stricture.  Pain is much improved.  No nausea vomiting.  No stools yesterday after the colonoscopy but had what she considers quite a few stools as part of her bowel prep before colonoscopy.  This morning she passed a moderate amount of soft, brown stool.  Tolerating diet.  OBJECTIVE:         Vital signs in last 24 hours:    Temp:  [97.6 F (36.4 C)-98.8 F (37.1 C)] 97.6 F (36.4 C) (12/15 1215) Pulse Rate:  [79-86] 86 (12/15 1215) Resp:  [16-18] 16 (12/15 1215) BP: (133-164)/(66-89) 163/80 (12/15 1215) SpO2:  [94 %-96 %] 96 % (12/15 1215) Last BM Date: 02/22/20 Filed Weights   02/20/20 0109 02/21/20 0853  Weight: (!) 137.5 kg 136.1 kg   General: Looks well.  Comfortable Heart: RRR Chest: Clear bilaterally. Abdomen: Soft, nontender.  Active bowel sounds. Extremities: No CCE. Neuro/Psych: Pleasant, cooperative, fully alert and oriented.  Good historian.  Intake/Output from previous day: 12/14 0701 - 12/15 0700 In: 240 [P.O.:240] Out: -   Intake/Output this shift: No intake/output data recorded.  Lab Results: Recent Labs    02/20/20 0317 02/21/20 0249 02/22/20 0356  WBC 7.7 5.3 4.5  HGB 10.9* 10.1* 10.3*  HCT 32.0* 30.2* 31.4*  PLT 212 197 221   BMET Recent Labs    02/20/20 0317 02/21/20 0249 02/22/20 0356  NA 135 140 141  K 4.2 4.0 3.7  CL 102 106 104  CO2 24 23 28   GLUCOSE 116* 108* 107*  BUN 14 10 6*  CREATININE 0.81 0.85 0.78  CALCIUM 8.6* 8.8* 8.9   LFT No results for input(s): PROT, ALBUMIN, AST, ALT, ALKPHOS, BILITOT, BILIDIR, IBILI in the last 72 hours. PT/INR No results for input(s): LABPROT, INR in the last 72 hours. Hepatitis Panel No results for input(s): HEPBSAG, HCVAB, HEPAIGM, HEPBIGM in the last 72 hours.  Studies/Results: No results  found.  ASSESMENT:   *    Constipation, abdominal pain.  10/2013 segmental colectomy in region of splenic flexure for splenic stricture from chronic ischemia. 02/21/20 Colonoscopy: Multiple superficial ulcers at 30- 50 cm associated with edema and stenosis. Pathology from biopsies confirmed ischemic colitis, no features of IBD.   PLAN   *   Stop the Senokot as this is a stimulant laxative.  Continue MiraLAX 2-3 times a day.  Low residue diet.   fup w APP, see Prov navigator.    *  OK to discharge home.  Will arrange GI fup w Dr Ardis Hughs or APP in  A few weeks    Azucena Freed  02/22/2020, 2:43 PM Phone [phone]   Attending physician's note   I have taken an interval history, reviewed the chart and examined the patient. I agree with the Advanced Practitioner's note, impression and recommendations.    Abdominal pain improved Biopsies consistent with ischemic injury  Discussed bowel regimen to prevent constipation. Use MiraLAX 1 capful 2-3 times daily and add Benefiber or soluble fiber 1 tablespoon 3 times daily with meals  Low residue diet Increase water intake  We will arrange for follow-up with outpatient GI, Dr. Ardis Hughs or app in 4 to 6 weeks. Advised patient to call to schedule appointment  Okay to discharge home from GI standpoint.   I have spent 35 minutes of patient care (this includes  precharting, chart review, review of results, face-to-face time used for counseling as well as treatment plan and follow-up. The patient was provided an opportunity to ask questions and all were answered. The patient agreed with the plan and demonstrated an understanding of the instructions.  Damaris Hippo , MD [phone]

## 2020-02-22 NOTE — Progress Notes (Signed)
Physical Therapy Treatment Patient Details Name: Monique Gonzalez MRN: [id number] DOB: March 02, 1956 Today's Date: 02/22/2020    History of Present Illness Pt is a 64 y/o female admitted secondary to worsening abdominal pain, nausea and constipation. Likely secondary to colitis. PMH includes s/p colectomy,  R TKA, R ankle fx, and L wrist fx.    PT Comments    Pt is progressing well towards goals. She increased ambulation distance today and negotiated a flight of steps with min guard. Expected to d/c home today.    Follow Up Recommendations  Other (comment) (continue outpatient PT for wrist)     Equipment Recommendations  None recommended by PT    Recommendations for Other Services       Precautions / Restrictions Precautions Precautions: Fall Restrictions Weight Bearing Restrictions: No    Mobility  Bed Mobility Overal bed mobility: Modified Independent             General bed mobility comments: mobilizing well in bed with mild use of bed rails.  Transfers Overall transfer level: Needs assistance Equipment used: None Transfers: Sit to/from Stand Sit to Stand: Min guard         General transfer comment: Min guard for safety.  Ambulation/Gait Ambulation/Gait assistance: Min guard Gait Distance (Feet): 250 Feet Assistive device: None Gait Pattern/deviations: Step-through pattern;Decreased stride length;Wide base of support;Trunk flexed Gait velocity: Decreased   General Gait Details: Pt with wide BOS support and lumbering gait, able to improve with cueing.   Stairs Stairs: Yes Stairs assistance: Min guard Stair Management: One rail Right;Forwards;Sideways Number of Stairs: 16 General stair comments: Pt able to ascend steps with min guard for safety and step to pattern. Pt began descending forward, however when she expressed feeling unsteady, she was instructed on descending sideways to allow both hands to grip the rail.   Wheelchair Mobility    Modified  Rank[place] (Stroke Patients Only)       Balance Overall balance assessment: Mild deficits observed, not formally tested                                          Cognition Arousal/Alertness: Awake/alert Behavior During Therapy: WFL for tasks assessed/performed Overall Cognitive Status: Within Functional Limits for tasks assessed                                        Exercises      General Comments General comments (skin integrity, edema, etc.): pt sister present      Pertinent Vitals/Pain Pain Assessment: Faces Faces Pain Scale: Hurts a little bit Pain Location: R ankle Pain Intervention(s): Monitored during session;Limited activity within patient's tolerance    Home Living                      Prior Function            PT Goals (current goals can now be found in the care plan section) Acute Rehab PT Goals Patient Stated Goal: to feel better PT Goal Formulation: With patient Time For Goal Achievement: 03/05/20 Potential to Achieve Goals: Good Progress towards PT goals: Progressing toward goals    Frequency    Min 3X/week      PT Plan Current plan remains appropriate    Co-evaluation  AM-PAC PT "6 Clicks" Mobility   Outcome Measure  Help needed turning from your back to your side while in a flat bed without using bedrails?: None Help needed moving from lying on your back to sitting on the side of a flat bed without using bedrails?: None Help needed moving to and from a bed to a chair (including a wheelchair)?: A Little Help needed standing up from a chair using your arms (e.g., wheelchair or bedside chair)?: A Little Help needed to walk in hospital room?: A Little Help needed climbing 3-5 steps with a railing? : A Little 6 Click Score: 20    End of Session Equipment Utilized During Treatment: Gait belt Activity Tolerance: Patient tolerated treatment well Patient left: in bed;with call  bell/phone within reach;with family/visitor present Nurse Communication: Mobility status PT Visit Diagnosis: Other abnormalities of gait and mobility (R26.89);Muscle weakness (generalized) (M62.81)     Time: 6294-7654 PT Time Calculation (min) (ACUTE ONLY): 12 min  Charges:  $Gait Training: 8-22 mins                    Benjiman Core, [place] Pager [phone] Acute Rehab   Allena Katz 02/22/2020, 10:55 AM

## 2020-02-24 ENCOUNTER — Encounter (HOSPITAL_COMMUNITY): Payer: Self-pay | Admitting: Internal Medicine

## 2020-02-28 ENCOUNTER — Other Ambulatory Visit: Payer: Self-pay | Admitting: Family Medicine

## 2020-02-28 ENCOUNTER — Other Ambulatory Visit: Payer: Self-pay

## 2020-02-28 ENCOUNTER — Encounter: Payer: Self-pay | Admitting: Family Medicine

## 2020-02-28 ENCOUNTER — Ambulatory Visit (INDEPENDENT_AMBULATORY_CARE_PROVIDER_SITE_OTHER): Payer: Medicare Other | Admitting: Family Medicine

## 2020-02-28 VITALS — BP 140/90 | HR 94 | Temp 98.2°F | Ht 71.0 in | Wt 294.0 lb

## 2020-02-28 DIAGNOSIS — K559 Vascular disorder of intestine, unspecified: Secondary | ICD-10-CM

## 2020-02-28 NOTE — Progress Notes (Signed)
Subjective:    [name], female    DOB: [birth year], 64 y.o.   MRN: [id number]  HPI Patient is a very pleasant 64 year old Caucasian female here today for hospital follow-up.  In 2015, the patient was found to have a colon stricture.  Ultimately was thought to be due to ischemic colitis.  The stricture was removed.  At that time, the patient had a CT angiogram of the abdomen.  The aorta, celiac, SMA, IMA were all visualized and there was no evidence of embolization or stenosis.  All the tributaries appeared widely patent.  Patient did well after that incident and has not had any further issues since that time.  Unfortunately recently she developed severe abdominal pain in the left lower quadrant.  She went to the hospital where a CT scan showed stranding around the sigmoid colon consistent with colitis.  She was initially treated with antibiotics for infectious colitis.  Situation worsened and GI was consulted.  Patient underwent a colonoscopy under the care of Dr. Carlean Purl.  Due to inadequate prep, complete visualization was not possible.  However there were numerous areas of ulceration in the sigmoid colon.  The differential diagnosis for this would be ischemic colitis versus inflammatory bowel disease.  Biopsy results returned consistent with ischemic colitis as there was no evidence of inflammation or granulomas to suggest inflammatory bowel disease.  Patient was sent home with a strict bowel regimen including 3 times daily MiraLAX and 3 times daily Benefiber.  I believe the assumption is that constipation is creating pressure in the intestinal wall that could be compromising blood flow to the intestine.  Patient is doing well after discharge from the hospital.  She denies any nausea or vomiting or fever.  She is having soft bowel movements every day.  She denies any melena or hematochezia Past Medical History:  Diagnosis Date  . Allergy   . Chronic pain    back and knee  . Colitis  09/2012   infectious vs inflammatory.   . Colon stricture ([place])   . Depression   . Hyperlipidemia   . Obesity   . Osteoarthritis   . Osteopenia   . Recurrent cold sores   . Seasonal allergies   . Syncope and collapse 11/30/2012   Normal EEG.  Vaso vagal syncope   Past Surgical History:  Procedure Laterality Date  . BIOPSY  02/21/2020   Procedure: BIOPSY;  Surgeon: Gatha Mayer, MD;  Location: [facility] ENDOSCOPY;  Service: Endoscopy;;  . COLON RESECTION N/A 10/14/2013   Procedure: LAPAROSCOPIC MOBILIZATION OF SPLENIC FLEXURE, LAPAROSCOPIC EXTENDED LEFT COLECTOMY;  Surgeon: [name], MD;  Location: [place];  Service: General;  Laterality: N/A;  . COLONOSCOPY N/A 10/12/2013   Procedure: COLONOSCOPY;  Surgeon: [name], MD;  Location: [facility] ENDOSCOPY;  Service: Endoscopy;  Laterality: N/A;  . COLONOSCOPY WITH PROPOFOL N/A 02/21/2020   Procedure: COLONOSCOPY WITH PROPOFOL;  Surgeon: Gatha Mayer, MD;  Location: [facility] ENDOSCOPY;  Service: Endoscopy;  Laterality: N/A;  . DILATION AND CURETTAGE, DIAGNOSTIC / THERAPEUTIC  1987  . OPEN REDUCTION INTERNAL FIXATION (ORIF) DISTAL RADIAL FRACTURE Right 08/08/2014   Procedure: OPEN REDUCTION INTERNAL FIXATION (ORIF) DISTAL RADIAL FRACTURE;  Surgeon: Roseanne Kaufman, MD;  Location: [place];  Service: Orthopedics;  Laterality: Right;  DVR Crosslock, MD available sometime around 1430-1500  . ORIF ANKLE FRACTURE Right 11/10/2019   Procedure: POSSIBLEOPEN REDUCTION INTERNAL FIXATION (ORIF) ANKLE FRACTURE;  Surgeon: Shona Needles, MD;  Location: [place];  Service: Orthopedics;  Laterality: Right;  . ORIF WRIST FRACTURE Left 11/10/2019   Procedure: OPEN REDUCTION INTERNAL FIXATION (ORIF) WRIST FRACTURE;  Surgeon: Shona Needles, MD;  Location: [place];  Service: Orthopedics;  Laterality: Left;  . TONSILLECTOMY    . TOTAL KNEE ARTHROPLASTY Right 08/05/2016  . TOTAL KNEE ARTHROPLASTY Right 08/05/2016   Procedure: RIGHT TOTAL KNEE ARTHROPLASTY;  Surgeon: Mcarthur Rossetti, MD;  Location: [place];  Service: Orthopedics;  Laterality: Right;   Current Outpatient Medications on File Prior to Visit  Medication Sig Dispense Refill  . acetaminophen (TYLENOL) 650 MG CR tablet Take 1,300 mg by mouth at bedtime.    Marland Kitchen atorvastatin (LIPITOR) 40 MG tablet Take 1 tablet (40 mg total) by mouth daily. (Patient taking differently: Take 40 mg by mouth at bedtime.) 90 tablet 3  . Calcium Carb-Cholecalciferol (CALCIUM 600+D3 PO) Take 1 tablet by mouth in the morning and at bedtime.    . Cholecalciferol (VITAMIN D-3) 25 MCG (1000 UT) CAPS Take 1,000 Units by mouth daily with breakfast.    . citalopram (CELEXA) 20 MG tablet Take 1 tablet (20 mg total) by mouth at bedtime. 90 tablet 3  . cyclobenzaprine (FLEXERIL) 10 MG tablet Take 1 tablet (10 mg total) by mouth 3 (three) times daily as needed for muscle spasms. (Patient taking differently: Take 10 mg by mouth See admin instructions. Take 10 mg by mouth at bedtime and an additional 10 mg two times a day as needed for muscle spasms) 270 tablet 1  . docusate sodium (COLACE) 100 MG capsule Take 1 capsule (100 mg total) by mouth 2 (two) times daily. 60 capsule 0  . fluticasone (FLONASE) 50 MCG/ACT nasal spray Place 2 sprays into both nostrils daily as needed (for seasonal allergies).    . loratadine (CLARITIN) 10 MG tablet Take 10 mg by mouth at bedtime.    . Melatonin 10 MG TABS Take 10 mg by mouth at bedtime.    . Multiple Vitamins-Minerals (ONE-A-DAY WOMENS PO) Take 1 tablet by mouth daily with breakfast.    . naproxen sodium (ALEVE) 220 MG tablet Take 220 mg by mouth 2 (two) times daily as needed (for pain).    . Omega-3 Fatty Acids (FISH OIL) 1000 MG CAPS Take 1,000 mg by mouth daily after breakfast.    . pantoprazole (PROTONIX) 40 MG tablet Take 1 tablet (40 mg total) by mouth daily. (Patient taking differently: Take 40 mg by mouth daily before breakfast.) 90 tablet 4  . polyethylene glycol (MIRALAX / GLYCOLAX) 17 g packet  Take 17 g by mouth 3 (three) times daily. 100 each 0  . Wheat Dextrin (BENEFIBER DRINK MIX PO) Take by mouth.     No current facility-administered medications on file prior to visit.   Allergies  Allergen Reactions  . Ciprofloxacin Itching, Dermatitis and Rash  . Penicillins Rash and Other (See Comments)    Has patient had a PCN reaction causing immediate rash, facial/tongue/throat swelling, SOB or lightheadedness with hypotension: Yes Has patient had a PCN reaction causing severe rash involving mucus membranes or skin necrosis: Unknown Has patient had a PCN reaction that required hospitalization: No Has patient had a PCN reaction occurring within the last 10 years: No If all of the above answers are "NO", then may proceed with Cephalosporin use. Childhood reaction.  . Sulfa Antibiotics Rash   Social History   Socioeconomic History  . Marital status: Divorced    Spouse name: Not on file  . Number of  children: 2  . Years of education: college  . Highest education level: Not on file  Occupational History  . 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 Smoking status: Former Smoker    Quit date: 10/27/1993    Years since quitting: 26.3  . Smokeless tobacco: Never Used  Vaping Use  . Vaping Use: Never used  Substance and Sexual Activity  . Alcohol use: Yes    Comment: one drink per week  . Drug use: No  . Sexual activity: Not Currently  Other Topics Concern  . Not on file  Social History Narrative  . Not on file   Social Determinants of Health   Financial Resource Strain: Not on file  Food Insecurity: Not on file  Transportation Needs: Not on file  Physical Activity: Not on file  Stress: Not on file  Social Connections: Not on file  Intimate Partner Violence: Not on file   Family History  Problem Relation Age of Onset  . Stroke Mother   . Arthritis Mother   . Thyroid disease Mother   . Cancer Father    . Arthritis Sister   . Thyroid disease Sister   . Cancer Maternal Grandmother   . Stroke Maternal Grandfather   . Cancer Maternal Grandfather   . Colon cancer Maternal Grandfather   . Cancer Paternal Grandmother   . Heart disease Paternal Grandmother   . Colon cancer Paternal Grandmother   . Stroke Paternal Grandfather   . Arthritis Sister   . Obesity Sister   . Sudden death Neg Hx   . Hypertension Neg Hx   . Hyperlipidemia Neg Hx   . Heart attack Neg Hx   . Diabetes Neg Hx   . Rectal cancer Neg Hx   . Stomach cancer Neg Hx   . Esophageal cancer Neg Hx       Review of Systems  All other systems reviewed and are negative.      Objective:   Physical Exam Vitals reviewed.  Constitutional:      General: She is not in acute distress.    Appearance: She is obese. She is not ill-appearing, toxic-appearing or diaphoretic.  HENT:     Head: Normocephalic and atraumatic.     Right Ear: Tympanic membrane, ear canal and external ear normal. There is no impacted cerumen.     Left Ear: Tympanic membrane, ear canal and external ear normal. There is no impacted cerumen.     Nose: Nose normal. No congestion or rhinorrhea.     Mouth/Throat:     Mouth: Mucous membranes are moist.     Pharynx: Oropharynx is clear. No oropharyngeal exudate or posterior oropharyngeal erythema.  Eyes:     General: No scleral icterus.       Right eye: No discharge.        Left eye: No discharge.     Extraocular Movements: Extraocular movements intact.     Conjunctiva/sclera: Conjunctivae normal.     Pupils: Pupils are equal, round, and reactive to light.  Neck:     Vascular: No carotid bruit.  Cardiovascular:     Rate and Rhythm: Normal rate and regular rhythm.     Pulses: Normal pulses.     Heart sounds: Normal heart sounds. No murmur heard. No friction rub. No gallop.   Pulmonary:     Effort: Pulmonary effort is normal. No respiratory distress.  Breath sounds: Normal breath sounds. No  stridor. No wheezing, rhonchi or rales.  Chest:     Chest wall: No tenderness.  Abdominal:     General: Bowel sounds are normal. There is no distension.     Palpations: Abdomen is soft. There is no mass.     Tenderness: There is no abdominal tenderness. There is no right CVA tenderness, left CVA tenderness, guarding or rebound.     Hernia: No hernia is present.  Musculoskeletal:     Cervical back: Normal range of motion and neck supple. No rigidity. No muscular tenderness.     Right lower leg: No edema.     Left lower leg: No edema.  Lymphadenopathy:     Cervical: No cervical adenopathy.  Skin:    General: Skin is warm.     Coloration: Skin is not jaundiced or pale.     Findings: No bruising, erythema, lesion or rash.  Neurological:     General: No focal deficit present.     Mental Status: She is alert and oriented to person, place, and time. Mental status is at baseline.     Cranial Nerves: No cranial nerve deficit.     Sensory: No sensory deficit.     Motor: No weakness.     Coordination: Coordination normal.     Gait: Gait normal.     Deep Tendon Reflexes: Reflexes normal.  Psychiatric:        Mood and Affect: Mood normal.        Behavior: Behavior normal.        Thought Content: Thought content normal.        Judgment: Judgment normal.           Assessment & Plan:  The encounter diagnosis was Ischemic colitis ([place]). Given the normal biopsy excluding inflammatory bowel disease, the patient's presentation is most consistent with ischemic colitis.  Her last CT angiogram was in 2015.  I question if we should repeat this however I doubt that the patient would have developed severe stenosis in just 6 years.  I also discussed with the patient whether we should do an echocardiogram to evaluate for an arterial thrombus.  Also we could potentially refer to a cardiologist for an event monitor to see if the patient is having A. fib.  However I believe the likelihood of this would be  rare.  As I discussed with the patient, I feel it would be highly unlikely for her to be having emboli from her heart due to A. fib or some type of arterial thrombus that bypasses every other organ system but affects the colon.  She is in agreement.  She was extremely sick and in the hospital her sed rate and her CRP were elevated.  Therefore this could be just due to the inflammation of the acute event.  However I will repeat this.  I question if there may be some type of autoimmune process or vasculitis at play.  If her sed rate or CRP are elevated, I may need to do some more research to see if there can be a vasculitis that can affect the intestines.

## 2020-02-29 LAB — SEDIMENTATION RATE: Sed Rate: 43 mm/h — ABNORMAL HIGH (ref 0–30)

## 2020-02-29 LAB — CBC WITH DIFFERENTIAL/PLATELET
Absolute Monocytes: 764 cells/uL (ref 200–950)
Basophils Absolute: 50 cells/uL (ref 0–200)
Basophils Relative: 0.6 %
Eosinophils Absolute: 34 cells/uL (ref 15–500)
Eosinophils Relative: 0.4 %
HCT: 39 % (ref 35.0–45.0)
Hemoglobin: 13 g/dL (ref 11.7–15.5)
Lymphs Abs: 1268 cells/uL (ref 850–3900)
MCH: 29.1 pg (ref 27.0–33.0)
MCHC: 33.3 g/dL (ref 32.0–36.0)
MCV: 87.2 fL (ref 80.0–100.0)
MPV: 9.3 fL (ref 7.5–12.5)
Monocytes Relative: 9.1 %
Neutro Abs: 6283 cells/uL (ref 1500–7800)
Neutrophils Relative %: 74.8 %
Platelets: 300 10*3/uL (ref 140–400)
RBC: 4.47 10*6/uL (ref 3.80–5.10)
RDW: 13.5 % (ref 11.0–15.0)
Total Lymphocyte: 15.1 %
WBC: 8.4 10*3/uL (ref 3.8–10.8)

## 2020-02-29 LAB — COMPLETE METABOLIC PANEL WITH GFR
AG Ratio: 2 (calc) (ref 1.0–2.5)
ALT: 56 U/L — ABNORMAL HIGH (ref 6–29)
AST: 22 U/L (ref 10–35)
Albumin: 4.5 g/dL (ref 3.6–5.1)
Alkaline phosphatase (APISO): 71 U/L (ref 37–153)
BUN: 16 mg/dL (ref 7–25)
CO2: 30 mmol/L (ref 20–32)
Calcium: 10.1 mg/dL (ref 8.6–10.4)
Chloride: 101 mmol/L (ref 98–110)
Creat: 0.86 mg/dL (ref 0.50–0.99)
GFR, Est African American: 82 mL/min/{1.73_m2} (ref >=60)
GFR, Est Non African American: 70 mL/min/{1.73_m2} (ref >=60)
Globulin: 2.3 g/dL (calc) (ref 1.9–3.7)
Glucose, Bld: 93 mg/dL (ref 65–99)
Potassium: 5.3 mmol/L (ref 3.5–5.3)
Sodium: 140 mmol/L (ref 135–146)
Total Bilirubin: 0.3 mg/dL (ref 0.2–1.2)
Total Protein: 6.8 g/dL (ref 6.1–8.1)

## 2020-02-29 LAB — C-REACTIVE PROTEIN: CRP: 7 mg/L (ref <8.0)

## 2020-03-08 ENCOUNTER — Ambulatory Visit: Payer: Medicare Other

## 2020-03-08 ENCOUNTER — Other Ambulatory Visit: Payer: Self-pay

## 2020-03-08 DIAGNOSIS — K559 Vascular disorder of intestine, unspecified: Secondary | ICD-10-CM

## 2020-03-09 LAB — SEDIMENTATION RATE: Sed Rate: 51 mm/h — ABNORMAL HIGH (ref 0–30)

## 2020-03-19 ENCOUNTER — Encounter: Payer: Self-pay | Admitting: Family Medicine

## 2020-03-19 ENCOUNTER — Other Ambulatory Visit: Payer: Self-pay | Admitting: *Deleted

## 2020-03-19 DIAGNOSIS — K559 Vascular disorder of intestine, unspecified: Secondary | ICD-10-CM

## 2020-03-19 DIAGNOSIS — R700 Elevated erythrocyte sedimentation rate: Secondary | ICD-10-CM

## 2020-03-20 LAB — HM MAMMOGRAPHY

## 2020-03-22 ENCOUNTER — Ambulatory Visit (INDEPENDENT_AMBULATORY_CARE_PROVIDER_SITE_OTHER): Payer: Medicare Other | Admitting: Physician Assistant

## 2020-03-22 ENCOUNTER — Encounter: Payer: Self-pay | Admitting: Physician Assistant

## 2020-03-22 VITALS — BP 126/64 | HR 72 | Ht 71.0 in | Wt 295.0 lb

## 2020-03-22 DIAGNOSIS — K559 Vascular disorder of intestine, unspecified: Secondary | ICD-10-CM

## 2020-03-22 DIAGNOSIS — K5900 Constipation, unspecified: Secondary | ICD-10-CM

## 2020-03-22 NOTE — Patient Instructions (Signed)
If you are age 67 or older, your body mass index should be between 23-30. Your Body mass index is 41.14 kg/m. If this is out of the aforementioned range listed, please consider follow up with your Primary Care Provider.  If you are age 58 or younger, your body mass index should be between 19-25. Your Body mass index is 41.14 kg/m. If this is out of the aformentioned range listed, please consider follow up with your Primary Care Provider.   Follow up as needed.  Thank you for choosing me and [facility].  Ellouise Newer, PA-C

## 2020-03-22 NOTE — Progress Notes (Signed)
I agree with the above note, plan.  I do not think she needs any other testing at this point, including repeat imaging.  Lets have her next appointment be with me in the office.  Thanks

## 2020-03-22 NOTE — Progress Notes (Signed)
Chief Complaint: Follow-up hospital stay for diverticulitis  HPI:    Monique Gonzalez is a 65 year old Caucasian female with a past medical history as listed below including sigmoid stricture status post partial colectomy in 2015, known to Dr. Ardis Hughs, who presents to clinic today for follow-up after being admitted to the hospital for diverticulitis.    02/19/2020-02/22/2020 patient hospitalized for diverticulitis.  At that time had a CT abdomen pelvis with significant stool burden throughout the colon and inflammation/stranding surrounding the sigmoid colon consistent with diverticulitis versus infectious versus inflammatory colitis.  Colonoscopy performed 02/21/2020 by Dr. Autumn Patty with findings of poor preparation with aborted procedure, transverse colon mucosal ulceration status post biopsies concerning for IBD versus ischemia, less likely infectious.  Patient started MiraLAX twice a day and senna 2 tablets p.o. twice daily.  Biopsies came back consistent with ischemic injury.  Recommended she stay on a bowel regimen to prevent constipation and use MiraLAX 1 capful 2-3 times daily as well as Benefiber 1 tablespoon 3 times a day with meals.    Today, the patient presents to clinic accompanied by her sister.  She explains that initially she went home and was on 2 Colace a day as well as MiraLAX and fiber 3 times daily and this worked for a while but then last week she started with urgent loose stools so she backed down to 2 Colace in the morning and MiraLAX and fiber twice daily.  She is now having soft/formed stools 1-3 times a day and the urgency is gone.  This regimen is currently working for her her.  Does tell me that she is very stressed and anxious about her GI system in general given this recent episode and the severe pain "I never want that to happen again".  Describes that she has been on a mostly bland/low fiber low residue diet since leaving the hospital but wants to know how long she needs to stay  on this.  Also asked questions about the incomplete colonoscopy.  Does have continued lower abdominal pain which is improving maybe rated as a 2-05/2008 now.    Denies fever, chills, weight loss or blood in her stools.  Past Medical History:  Diagnosis Date  . Allergy   . Chronic pain    back and knee  . Colitis 09/2012   infectious vs inflammatory.   . Colon stricture ([place])   . Depression   . Hyperlipidemia   . Obesity   . Osteoarthritis   . Osteopenia   . Recurrent cold sores   . Seasonal allergies   . Syncope and collapse 11/30/2012   Normal EEG.  Vaso vagal syncope    Past Surgical History:  Procedure Laterality Date  . BIOPSY  02/21/2020   Procedure: BIOPSY;  Surgeon: Gatha Mayer, MD;  Location: [facility] ENDOSCOPY;  Service: Endoscopy;;  . COLON RESECTION N/A 10/14/2013   Procedure: LAPAROSCOPIC MOBILIZATION OF SPLENIC FLEXURE, LAPAROSCOPIC EXTENDED LEFT COLECTOMY;  Surgeon: [name], MD;  Location: [place];  Service: General;  Laterality: N/A;  . COLONOSCOPY N/A 10/12/2013   Procedure: COLONOSCOPY;  Surgeon: [name], MD;  Location: [facility] ENDOSCOPY;  Service: Endoscopy;  Laterality: N/A;  . COLONOSCOPY WITH PROPOFOL N/A 02/21/2020   Procedure: COLONOSCOPY WITH PROPOFOL;  Surgeon: Gatha Mayer, MD;  Location: [facility] ENDOSCOPY;  Service: Endoscopy;  Laterality: N/A;  . DILATION AND CURETTAGE, DIAGNOSTIC / THERAPEUTIC  1987  . OPEN REDUCTION INTERNAL FIXATION (ORIF) DISTAL RADIAL FRACTURE Right 08/08/2014   Procedure: OPEN REDUCTION  INTERNAL FIXATION (ORIF) DISTAL RADIAL FRACTURE;  Surgeon: Roseanne Kaufman, MD;  Location: [place];  Service: Orthopedics;  Laterality: Right;  DVR Crosslock, MD available sometime around 1430-1500  . ORIF ANKLE FRACTURE Right 11/10/2019   Procedure: POSSIBLEOPEN REDUCTION INTERNAL FIXATION (ORIF) ANKLE FRACTURE;  Surgeon: Shona Needles, MD;  Location: [place];  Service: Orthopedics;  Laterality: Right;  . ORIF WRIST FRACTURE Left 11/10/2019   Procedure: OPEN  REDUCTION INTERNAL FIXATION (ORIF) WRIST FRACTURE;  Surgeon: Shona Needles, MD;  Location: [place];  Service: Orthopedics;  Laterality: Left;  . TONSILLECTOMY    . TOTAL KNEE ARTHROPLASTY Right 08/05/2016  . TOTAL KNEE ARTHROPLASTY Right 08/05/2016   Procedure: RIGHT TOTAL KNEE ARTHROPLASTY;  Surgeon: Mcarthur Rossetti, MD;  Location: [place];  Service: Orthopedics;  Laterality: Right;    Current Outpatient Medications  Medication Sig Dispense Refill  . acetaminophen (TYLENOL) 650 MG CR tablet Take 1,300 mg by mouth at bedtime.    Marland Kitchen atorvastatin (LIPITOR) 40 MG tablet Take 1 tablet (40 mg total) by mouth daily. (Patient taking differently: Take 40 mg by mouth at bedtime.) 90 tablet 3  . Calcium Carb-Cholecalciferol (CALCIUM 600+D3 PO) Take 1 tablet by mouth in the morning and at bedtime.    . Cholecalciferol (VITAMIN D-3) 25 MCG (1000 UT) CAPS Take 1,000 Units by mouth daily with breakfast.    . citalopram (CELEXA) 20 MG tablet Take 1 tablet (20 mg total) by mouth at bedtime. 90 tablet 3  . cyclobenzaprine (FLEXERIL) 10 MG tablet Take 1 tablet (10 mg total) by mouth 3 (three) times daily as needed for muscle spasms. (Patient taking differently: Take 10 mg by mouth See admin instructions. Take 10 mg by mouth at bedtime and an additional 10 mg two times a day as needed for muscle spasms) 270 tablet 1  . docusate sodium (COLACE) 100 MG capsule Take 1 capsule (100 mg total) by mouth 2 (two) times daily. 60 capsule 0  . fluticasone (FLONASE) 50 MCG/ACT nasal spray Place 2 sprays into both nostrils daily as needed (for seasonal allergies).    . loratadine (CLARITIN) 10 MG tablet Take 10 mg by mouth at bedtime.    . Melatonin 10 MG TABS Take 10 mg by mouth at bedtime.    . Multiple Vitamins-Minerals (ONE-A-DAY WOMENS PO) Take 1 tablet by mouth daily with breakfast.    . naproxen sodium (ALEVE) 220 MG tablet Take 220 mg by mouth 2 (two) times daily as needed (for pain).    . Omega-3 Fatty Acids (FISH  OIL) 1000 MG CAPS Take 1,000 mg by mouth daily after breakfast.    . pantoprazole (PROTONIX) 40 MG tablet Take 1 tablet (40 mg total) by mouth daily. (Patient taking differently: Take 40 mg by mouth daily before breakfast.) 90 tablet 4  . polyethylene glycol (MIRALAX / GLYCOLAX) 17 g packet Take 17 g by mouth 3 (three) times daily. 100 each 0  . Wheat Dextrin (BENEFIBER DRINK MIX PO) Take by mouth.     No current facility-administered medications for this visit.    Allergies as of 03/22/2020 - Review Complete 02/28/2020  Allergen Reaction Noted  . Ciprofloxacin Itching, Dermatitis, and Rash 02/19/2020  . Penicillins Rash and Other (See Comments) 09/23/2010  . Sulfa antibiotics Rash 09/23/2010    Family History  Problem Relation Age of Onset  . Stroke Mother   . Arthritis Mother   . Thyroid disease Mother   . Cancer Father   . Arthritis Sister   .  Thyroid disease Sister   . Cancer Maternal Grandmother   . Stroke Maternal Grandfather   . Cancer Maternal Grandfather   . Colon cancer Maternal Grandfather   . Cancer Paternal Grandmother   . Heart disease Paternal Grandmother   . Colon cancer Paternal Grandmother   . Stroke Paternal Grandfather   . Arthritis Sister   . Obesity Sister   . Sudden death Neg Hx   . Hypertension Neg Hx   . Hyperlipidemia Neg Hx   . Heart attack Neg Hx   . Diabetes Neg Hx   . Rectal cancer Neg Hx   . Stomach cancer Neg Hx   . Esophageal cancer Neg Hx     Social History   Socioeconomic History  . Marital status: Divorced    Spouse name: Not on file  . Number of children: 2  . Years of education: college  . Highest education level: Not on file  Occupational History  . 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 Smoking status: Former Smoker    Quit date: 10/27/1993    Years since quitting: 26.4  . Smokeless tobacco: Never Used  Vaping Use  . Vaping Use: Never used  Substance  and Sexual Activity  . Alcohol use: Yes    Comment: one drink per week  . Drug use: No  . Sexual activity: Not Currently  Other Topics Concern  . Not on file  Social History Narrative  . Not on file   Social Determinants of Health   Financial Resource Strain: Not on file  Food Insecurity: Not on file  Transportation Needs: Not on file  Physical Activity: Not on file  Stress: Not on file  Social Connections: Not on file  Intimate Partner Violence: Not on file    Review of Systems:    Constitutional: No weight loss, fever or chills Skin: No rash  Cardiovascular: No chest pain Respiratory: No SOB Gastrointestinal: See HPI and otherwise negative Genitourinary: No dysuria  Neurological: No headache, dizziness or syncope Musculoskeletal: No new muscle or joint pain Hematologic: No bleeding  Psychiatric: No history of depression or anxiety   Physical Exam:  Vital signs: BP 126/64   Pulse 72   Ht 5\' 11"  (1.803 m)   Wt 295 lb (133.8 kg)   BMI 41.14 kg/m   Constitutional:   Pleasant overweight Caucasian female appears to be in NAD, Well developed, Well nourished, alert and cooperative Respiratory: Respirations even and unlabored. Lungs clear to auscultation bilaterally.   No wheezes, crackles, or rhonchi.  Cardiovascular: Normal S1, S2. No MRG. Regular rate and rhythm. No peripheral edema, cyanosis or pallor.  Gastrointestinal:  Soft, nondistended, nontender. No rebound or guarding. Normal bowel sounds. No appreciable masses or hepatomegaly. Rectal:  Not performed.  Psychiatric: Demonstrates good judgement and reason without abnormal affect or behaviors.  RELEVANT LABS AND IMAGING: CBC    Component Value Date/Time   WBC 8.4 02/28/2020 1253   RBC 4.47 02/28/2020 1253   HGB 13.0 02/28/2020 1253   HGB 12.5 03/25/2016 1341   HCT 39.0 02/28/2020 1253   HCT 37.2 03/25/2016 1341   PLT 300 02/28/2020 1253   MCV 87.2 02/28/2020 1253   MCV 88 03/25/2016 1341   MCH 29.1  02/28/2020 1253   MCHC 33.3 02/28/2020 1253   RDW 13.5 02/28/2020 1253   RDW 14.0 03/25/2016 1341   LYMPHSABS 1,268 02/28/2020 1253   LYMPHSABS  1.2 03/25/2016 1341   MONOABS 0.4 11/09/2019 1622   EOSABS 34 02/28/2020 1253   EOSABS 0.0 03/25/2016 1341   BASOSABS 50 02/28/2020 1253   BASOSABS 0.0 03/25/2016 1341    CMP     Component Value Date/Time   NA 140 02/28/2020 1253   NA 142 03/25/2016 1341   K 5.3 02/28/2020 1253   CL 101 02/28/2020 1253   CO2 30 02/28/2020 1253   GLUCOSE 93 02/28/2020 1253   BUN 16 02/28/2020 1253   BUN 16 03/25/2016 1341   CREATININE 0.86 02/28/2020 1253   CALCIUM 10.1 02/28/2020 1253   PROT 6.8 02/28/2020 1253   PROT 6.5 03/25/2016 1341   ALBUMIN 3.6 02/19/2020 1104   ALBUMIN 4.2 03/25/2016 1341   AST 22 02/28/2020 1253   ALT 56 (H) 02/28/2020 1253   ALKPHOS 65 02/19/2020 1104   BILITOT 0.3 02/28/2020 1253   BILITOT 0.4 03/25/2016 1341   GFRNONAA 70 02/28/2020 1253   GFRAA 82 02/28/2020 1253    Assessment: 1.  Ischemic colitis: Diagnosed during recent hospitalization on colonoscopy, thought related to constipation, patient improving 2.  Constipation: Improved on current bowel regimen  Plan: 1.  For now continue to Colace in the morning, MiraLAX and fiber twice daily.  Discussed that she can continue to titrate this as needed in the future pending her bowel movements. 2.  Provided reassurance for the patient.  We do not think this is chronic and going forward hopefully it will not happen again if she is on a good bowel regimen. 3.  Patient can gradually increase her diet back to normal and even high fiber over the next 1 to 2 weeks when she no longer has lower abdominal pain. 4.  Patient is concerned that the colonoscopy "did not get all the way around".  I will ask Dr. Ardis Hughs about this, but her last complete colonoscopy was in 2015 with recommendation to repeat in 10 years.  We will also ask about possible necessity for CTA? 5.  Patient to  follow in clinic as needed with [name] in the near future.  Monique Newer, PA-C [facility] 03/22/2020, 9:48 AM  Cc: Susy Frizzle, MD

## 2020-03-23 ENCOUNTER — Telehealth: Payer: Self-pay

## 2020-03-23 NOTE — Telephone Encounter (Signed)
Spoke with patient in regards to Dr. Ardis Hughs recommendations as below. Patient is aware of appointment and had no concerns at the end of the call.

## 2020-03-23 NOTE — Telephone Encounter (Signed)
Levin Erp, PA  [place], [place], [place] Can you make appointment for this patient with Dr. Ardis Hughs in the next 4 to 6 weeks. Please let her know that Dr. Ardis Hughs just wanted to see her and reassure her himself. There is no need for her to have any further testing at this point for his point of view including colonoscopy.   Thanks-JLL     Patient is scheduled for a follow up with Dr. Ardis Hughs on Wednesday, 05/02/2020 at 1:30 PM. Lm on vm for patient to return call for appt information.

## 2020-04-06 ENCOUNTER — Other Ambulatory Visit: Payer: Self-pay

## 2020-04-06 ENCOUNTER — Other Ambulatory Visit: Payer: Medicare Other

## 2020-04-06 DIAGNOSIS — R700 Elevated erythrocyte sedimentation rate: Secondary | ICD-10-CM

## 2020-04-06 DIAGNOSIS — K559 Vascular disorder of intestine, unspecified: Secondary | ICD-10-CM

## 2020-04-09 ENCOUNTER — Ambulatory Visit: Payer: Medicare Other | Admitting: Dietician

## 2020-04-10 LAB — ANA, IFA COMPREHENSIVE PANEL
Anti Nuclear Antibody (ANA): NEGATIVE
ENA SM Ab Ser-aCnc: <1 AI
SM/RNP: <1 AI
SSA (Ro) (ENA) Antibody, IgG: <1 AI
SSB (La) (ENA) Antibody, IgG: <1 AI
Scleroderma (Scl-70) (ENA) Antibody, IgG: <1 AI
ds DNA Ab: <1 [IU]/mL

## 2020-04-10 LAB — ANCA SCREEN W REFLEX TITER: ANCA Screen: NEGATIVE

## 2020-04-23 ENCOUNTER — Other Ambulatory Visit: Payer: Self-pay | Admitting: Family Medicine

## 2020-04-23 DIAGNOSIS — M545 Low back pain, unspecified: Secondary | ICD-10-CM

## 2020-04-23 DIAGNOSIS — G8929 Other chronic pain: Secondary | ICD-10-CM

## 2020-05-02 ENCOUNTER — Encounter: Payer: Self-pay | Admitting: Gastroenterology

## 2020-05-02 ENCOUNTER — Ambulatory Visit (INDEPENDENT_AMBULATORY_CARE_PROVIDER_SITE_OTHER): Payer: Medicare Other | Admitting: Gastroenterology

## 2020-05-02 VITALS — BP 138/80 | HR 70 | Ht 71.0 in | Wt 298.2 lb

## 2020-05-02 DIAGNOSIS — K5900 Constipation, unspecified: Secondary | ICD-10-CM | POA: Diagnosis not present

## 2020-05-02 NOTE — Patient Instructions (Signed)
If you are age 67 or older, your body mass index should be between 23-30. Your Body mass index is 41.6 kg/m. If this is out of the aforementioned range listed, please consider follow up with your Primary Care Provider.  If you are age 75 or younger, your body mass index should be between 19-25. Your Body mass index is 41.6 kg/m. If this is out of the aformentioned range listed, please consider follow up with your Primary Care Provider.    Please continue your current bowel regiment.  Follow up as needed.  It was great to see you today! Dr. Ardis Hughs

## 2020-05-02 NOTE — Progress Notes (Signed)
Review of pertinent gastrointestinal problems: 1.  Chronic Colonic Ischemia : 10/2013 attempted colonoscopy.  For abdominal pain, constipation and abnormal CT.  This showed stenosis at the splenic flexure.  Could not pass scope beyond this area.  Pathology from biopsies do not support a diagnosis of IBD. 10/2013 segmental colectomy for splenic stricture likely from chronic ischemia.  Pathology confirmed ulceration, inflammation, fibrosis favoring ischemia 12/2013 colonoscopy.  Dr. Ardis Hughs.   Terminal ileum normal.  Left sided anastomosis mildly inflamed.  Colon proximal to anastomosis tortuous with an area that appeared to have chronic scarring.  Otherwise unremarkable study.  Right and left colon randomly biopsied. Right colon biopsy showed prominent lymphoid aggregates, benign.  No features of IBD, microscopic colitis, infectious colitis and no dysplasia.  Left colon biopsies showed benign mucosa with crypt distortion and nonspecific inflammation no dysplasia.  Repeat colonoscopy 02/2020 when hospitalized for "diverticulitis" versus IBD:: Findings poor prep, aborted procedure, transverse colon mucosal ulceration.  Biopsies came back consistent with ischemic injury.  She was recommended to stay on a bowel regimen to prevent constipation.   HPI: This is a very pleasant 65 year old woman whom I last saw quite a while ago however more recently she was seen in the hospital December and then most recently by [facility] in our office about 6 weeks ago.  She has had trouble with constipation for many years which has resulted in ischemic damage to her colon at least twice.  Since leaving the office she has really focused on a good healthy bowel regimen.  She was on MiraLAX and Benefiber both 3 times daily but this was too effective.  She decreased to twice daily and that worked quite well for week or 2 but then also became a bit too effective.  Most recently she is doing once daily single dose of MiraLAX and single  dose of Benefiber.  She takes this with her morning meal and coffee.  She takes stool softeners twice daily on this regimen she has a single well formed brown stool once daily.  She is not having any bleeding.  She rarely has abdominal pains.  She does have quite a bit of gas.   ROS: complete GI ROS as described in HPI, all other review negative.  Constitutional:  No unintentional weight loss   Past Medical History:  Diagnosis Date  . Allergy   . Chronic pain    back and knee  . Colitis 09/2012   infectious vs inflammatory.   . Colon stricture ([place])   . Depression   . Hyperlipidemia   . Obesity   . Osteoarthritis   . Osteopenia   . Recurrent cold sores   . Seasonal allergies   . Syncope and collapse 11/30/2012   Normal EEG.  Vaso vagal syncope    Past Surgical History:  Procedure Laterality Date  . BIOPSY  02/21/2020   Procedure: BIOPSY;  Surgeon: Gatha Mayer, MD;  Location: [facility] ENDOSCOPY;  Service: Endoscopy;;  . COLON RESECTION N/A 10/14/2013   Procedure: LAPAROSCOPIC MOBILIZATION OF SPLENIC FLEXURE, LAPAROSCOPIC EXTENDED LEFT COLECTOMY;  Surgeon: [name], MD;  Location: [place];  Service: General;  Laterality: N/A;  . COLONOSCOPY N/A 10/12/2013   Procedure: COLONOSCOPY;  Surgeon: [name], MD;  Location: [facility] ENDOSCOPY;  Service: Endoscopy;  Laterality: N/A;  . COLONOSCOPY WITH PROPOFOL N/A 02/21/2020   Procedure: COLONOSCOPY WITH PROPOFOL;  Surgeon: Gatha Mayer, MD;  Location: [facility] ENDOSCOPY;  Service: Endoscopy;  Laterality: N/A;  . DILATION AND CURETTAGE,  DIAGNOSTIC / THERAPEUTIC  1987  . OPEN REDUCTION INTERNAL FIXATION (ORIF) DISTAL RADIAL FRACTURE Right 08/08/2014   Procedure: OPEN REDUCTION INTERNAL FIXATION (ORIF) DISTAL RADIAL FRACTURE;  Surgeon: Roseanne Kaufman, MD;  Location: [place];  Service: Orthopedics;  Laterality: Right;  DVR Crosslock, MD available sometime around 1430-1500  . ORIF ANKLE FRACTURE Right 11/10/2019   Procedure: POSSIBLEOPEN REDUCTION  INTERNAL FIXATION (ORIF) ANKLE FRACTURE;  Surgeon: Shona Needles, MD;  Location: [place];  Service: Orthopedics;  Laterality: Right;  . ORIF WRIST FRACTURE Left 11/10/2019   Procedure: OPEN REDUCTION INTERNAL FIXATION (ORIF) WRIST FRACTURE;  Surgeon: Shona Needles, MD;  Location: [place];  Service: Orthopedics;  Laterality: Left;  . TONSILLECTOMY    . TOTAL KNEE ARTHROPLASTY Right 08/05/2016  . TOTAL KNEE ARTHROPLASTY Right 08/05/2016   Procedure: RIGHT TOTAL KNEE ARTHROPLASTY;  Surgeon: Mcarthur Rossetti, MD;  Location: [place];  Service: Orthopedics;  Laterality: Right;    Current Outpatient Medications  Medication Sig Dispense Refill  . acetaminophen (TYLENOL) 650 MG CR tablet Take 1,300 mg by mouth at bedtime.    Marland Kitchen atorvastatin (LIPITOR) 40 MG tablet Take 1 tablet (40 mg total) by mouth daily. 90 tablet 3  . b complex vitamins capsule Take 1 capsule by mouth daily.    . Calcium Carb-Cholecalciferol (CALCIUM 600+D3 PO) Take 1 tablet by mouth in the morning and at bedtime.    . Cholecalciferol (VITAMIN D-3) 25 MCG (1000 UT) CAPS Take 1,000 Units by mouth daily with breakfast.    . citalopram (CELEXA) 20 MG tablet Take 1 tablet (20 mg total) by mouth at bedtime. 90 tablet 3  . cyclobenzaprine (FLEXERIL) 10 MG tablet TAKE 1 TABLET (10 MG TOTAL) BY MOUTH 3 (THREE) TIMES DAILY AS NEEDED FOR MUSCLE SPASMS. 270 tablet 1  . docusate sodium (COLACE) 100 MG capsule Take 1 capsule (100 mg total) by mouth 2 (two) times daily. 60 capsule 0  . fluticasone (FLONASE) 50 MCG/ACT nasal spray Place 2 sprays into both nostrils daily as needed (for seasonal allergies).    . loratadine (CLARITIN) 10 MG tablet Take 10 mg by mouth at bedtime.    . Melatonin 10 MG TABS Take 10 mg by mouth at bedtime.    . Multiple Vitamins-Minerals (ONE-A-DAY WOMENS PO) Take 1 tablet by mouth daily with breakfast.    . naproxen sodium (ALEVE) 220 MG tablet Take 220 mg by mouth 2 (two) times daily as needed (for pain).    .  Omega-3 Fatty Acids (FISH OIL) 1000 MG CAPS Take 1,000 mg by mouth daily after breakfast.    . pantoprazole (PROTONIX) 40 MG tablet Take 1 tablet (40 mg total) by mouth daily. 90 tablet 4  . polyethylene glycol (MIRALAX / GLYCOLAX) 17 g packet Take 17 g by mouth daily.    . Wheat Dextrin (BENEFIBER) POWD Take by mouth. 2 teaspoons daily     No current facility-administered medications for this visit.    Allergies as of 05/02/2020 - Review Complete 05/02/2020  Allergen Reaction Noted  . Ciprofloxacin Itching, Dermatitis, and Rash 02/19/2020  . Penicillins Rash and Other (See Comments) 09/23/2010  . Sulfa antibiotics Rash 09/23/2010    Family History  Problem Relation Age of Onset  . Stroke Mother   . Arthritis Mother   . Thyroid disease Mother   . Cancer Father   . Arthritis Sister   . Thyroid disease Sister   . Cancer Maternal Grandmother   . Stroke Maternal Grandfather   .  Cancer Maternal Grandfather   . Colon cancer Maternal Grandfather   . Cancer Paternal Grandmother   . Heart disease Paternal Grandmother   . Colon cancer Paternal Grandmother   . Stroke Paternal Grandfather   . Arthritis Sister   . Obesity Sister   . Sudden death Neg Hx   . Hypertension Neg Hx   . Hyperlipidemia Neg Hx   . Heart attack Neg Hx   . Diabetes Neg Hx   . Rectal cancer Neg Hx   . Stomach cancer Neg Hx   . Esophageal cancer Neg Hx     Social History   Socioeconomic History  . Marital status: Divorced    Spouse name: Not on file  . Number of children: 2  . Years of education: college  . Highest education level: Not on file  Occupational History  . 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 Smoking status: Former Smoker    Quit date: 10/27/1993    Years since quitting: 26.5  . Smokeless tobacco: Never Used  Vaping Use  . Vaping Use: Never used  Substance and Sexual Activity  . Alcohol use: Yes    Comment: one drink  per week  . Drug use: No  . Sexual activity: Not Currently  Other Topics Concern  . Not on file  Social History Narrative  . Not on file   Social Determinants of Health   Financial Resource Strain: Not on file  Food Insecurity: Not on file  Transportation Needs: Not on file  Physical Activity: Not on file  Stress: Not on file  Social Connections: Not on file  Intimate Partner Violence: Not on file     Physical Exam: BP 138/80 (BP Location: Left Arm, Patient Position: Sitting, Cuff Size: Large)   Pulse 70   Ht 5\' 11"  (1.803 m)   Wt 298 lb 4 oz (135.3 kg)   BMI 41.60 kg/m  Constitutional: generally well-appearing Psychiatric: alert and oriented x3 Abdomen: soft, nontender, nondistended, no obvious ascites, no peritoneal signs, normal bowel sounds No peripheral edema noted in lower extremities  Assessment and plan: 65 y.o. female with significant constipation which has led to ischemic damage to her colon  She has modified her bowel regimen since her last office visit here 6 weeks ago.  Currently she is taking a single dose of MiraLAX and a single dose of Benefiber with her morning coffee.  She takes stool softeners over-the-counter twice daily.  On this regimen her bowels are moving every single day, 1 soft formed bowel movement.  This has not been her pattern for many years.  Prior to that she would only move her bowels every 2 or 3 days with intermittent bouts of significant constipation.  She is quite content with her new regimen and she knows to stay on it, indefinitely.  She knows that if she is unable to have a BM on any single day she should double or triple her MiraLAX dosing that night or the next day to ensure that she gets back on a regular schedule.  She knows to call here if she has any further questions or concerns, otherwise she will follow-up as needed.  Please see the "Patient Instructions" section for addition details about the plan.  Owens Loffler, MD [facility] 05/02/2020, 1:24 PM   Total time on date of encounter was 25 minutes (this included time spent preparing to see  the patient reviewing records; obtaining and/or reviewing separately obtained history; performing a medically appropriate exam and/or evaluation; counseling and educating the patient and family if present; ordering medications, tests or procedures if applicable; and documenting clinical information in the health record).

## 2020-06-05 ENCOUNTER — Other Ambulatory Visit: Payer: Self-pay

## 2020-06-05 ENCOUNTER — Ambulatory Visit (INDEPENDENT_AMBULATORY_CARE_PROVIDER_SITE_OTHER): Payer: Medicare Other | Admitting: Family Medicine

## 2020-06-05 ENCOUNTER — Encounter: Payer: Self-pay | Admitting: Family Medicine

## 2020-06-05 VITALS — BP 130/82 | HR 96 | Temp 98.1°F | Resp 16 | Ht 71.0 in | Wt 290.0 lb

## 2020-06-05 DIAGNOSIS — E7800 Pure hypercholesterolemia, unspecified: Secondary | ICD-10-CM | POA: Diagnosis not present

## 2020-06-05 DIAGNOSIS — Z0000 Encounter for general adult medical examination without abnormal findings: Secondary | ICD-10-CM

## 2020-06-05 DIAGNOSIS — Z780 Asymptomatic menopausal state: Secondary | ICD-10-CM

## 2020-06-05 DIAGNOSIS — K559 Vascular disorder of intestine, unspecified: Secondary | ICD-10-CM

## 2020-06-05 DIAGNOSIS — Z0001 Encounter for general adult medical examination with abnormal findings: Secondary | ICD-10-CM

## 2020-06-05 DIAGNOSIS — Z23 Encounter for immunization: Secondary | ICD-10-CM | POA: Diagnosis not present

## 2020-06-05 LAB — LIPID PANEL
Cholesterol: 175 mg/dL (ref <200)
HDL: 49 mg/dL — ABNORMAL LOW (ref >=50)
LDL Cholesterol (Calc): 96 mg/dL (calc)
Non-HDL Cholesterol (Calc): 126 mg/dL (calc) (ref <130)
Total CHOL/HDL Ratio: 3.6 (calc) (ref <5.0)
Triglycerides: 209 mg/dL — ABNORMAL HIGH (ref <150)

## 2020-06-05 LAB — COMPLETE METABOLIC PANEL WITH GFR
AG Ratio: 1.9 (calc) (ref 1.0–2.5)
ALT: 15 U/L (ref 6–29)
AST: 14 U/L (ref 10–35)
Albumin: 4.4 g/dL (ref 3.6–5.1)
Alkaline phosphatase (APISO): 59 U/L (ref 37–153)
BUN: 14 mg/dL (ref 7–25)
CO2: 27 mmol/L (ref 20–32)
Calcium: 9.8 mg/dL (ref 8.6–10.4)
Chloride: 103 mmol/L (ref 98–110)
Creat: 0.92 mg/dL (ref 0.50–0.99)
GFR, Est African American: 75 mL/min/{1.73_m2} (ref >=60)
GFR, Est Non African American: 65 mL/min/{1.73_m2} (ref >=60)
Globulin: 2.3 g/dL (calc) (ref 1.9–3.7)
Glucose, Bld: 101 mg/dL — ABNORMAL HIGH (ref 65–99)
Potassium: 4.2 mmol/L (ref 3.5–5.3)
Sodium: 140 mmol/L (ref 135–146)
Total Bilirubin: 0.4 mg/dL (ref 0.2–1.2)
Total Protein: 6.7 g/dL (ref 6.1–8.1)

## 2020-06-05 MED ORDER — VENLAFAXINE HCL ER 75 MG PO CP24: 150.0000 mg | ORAL_CAPSULE | Freq: Every day | ORAL | 3 refills | Status: DC

## 2020-06-05 NOTE — Addendum Note (Signed)
Addended by: Sheral Flow on: 06/05/2020 09:51 AM   Modules accepted: Orders

## 2020-06-05 NOTE — Progress Notes (Signed)
Subjective:    [name], female    DOB: 1955/05/26, 65 y.o.   MRN: [id number]  HPI Patient is a very pleasant 66 year old Caucasian female here today for complete physical exam.  Past medical history is significant for colitis most recently felt to be ischemic colitis.  However based on dietary changes she has is under good control and denies any additional abdominal pain.  She had a colonoscopy in 2015 and is due for repeat screening colonoscopy in 2025.  She did have a diagnostic colonoscopy in December 2021 but the prep was poor and not sufficient to exclude colon cancer.  This procedure was the source of the diagnosis of the ischemic colitis.  Her mammogram was performed in January and was normal.  She is overdue for a bone density test.  Her last bone density test was performed more than 5 years ago and showed mild osteopenia.  She is due to repeat that.  She is also overdue for a Pap smear however she prefers to get this from a female provider.  She has had Prevnar 13.  She is due for a tetanus shot which she wants today.  She is also due for Pneumovax 23.  She is already had all 3 doses of her COVID vaccine.  She has had 1 dose of Shingrix.  She is due for the second however her insurance is not paying for it so she has declined it at the present time. Past Medical History:  Diagnosis Date  . Allergy   . Anxiety    Phreesia 06/04/2020  . Chronic pain    back and knee  . Colitis 09/2012   infectious vs inflammatory.   . Colon stricture ([place])   . Depression   . Depression    Phreesia 06/04/2020  . GERD (gastroesophageal reflux disease)    Phreesia 06/04/2020  . Hyperlipidemia   . Obesity   . Osteoarthritis   . Osteopenia   . Recurrent cold sores   . Seasonal allergies   . Syncope and collapse 11/30/2012   Normal EEG.  Vaso vagal syncope   Past Surgical History:  Procedure Laterality Date  . BIOPSY  02/21/2020   Procedure: BIOPSY;  Surgeon: Gatha Mayer, MD;   Location: [place];  Service: Endoscopy;;  . COLON RESECTION N/A 10/14/2013   Procedure: LAPAROSCOPIC MOBILIZATION OF SPLENIC FLEXURE, LAPAROSCOPIC EXTENDED LEFT COLECTOMY;  Surgeon: [name], MD;  Location: [place];  Service: General;  Laterality: N/A;  . COLON SURGERY N/A    Phreesia 06/04/2020  . COLONOSCOPY N/A 10/12/2013   Procedure: COLONOSCOPY;  Surgeon: [name], MD;  Location: [facility] ENDOSCOPY;  Service: Endoscopy;  Laterality: N/A;  . COLONOSCOPY WITH PROPOFOL N/A 02/21/2020   Procedure: COLONOSCOPY WITH PROPOFOL;  Surgeon: Gatha Mayer, MD;  Location: [facility] ENDOSCOPY;  Service: Endoscopy;  Laterality: N/A;  . DILATION AND CURETTAGE, DIAGNOSTIC / THERAPEUTIC  1987  . FRACTURE SURGERY N/A    Phreesia 06/04/2020  . JOINT REPLACEMENT N/A    Phreesia 06/04/2020  . OPEN REDUCTION INTERNAL FIXATION (ORIF) DISTAL RADIAL FRACTURE Right 08/08/2014   Procedure: OPEN REDUCTION INTERNAL FIXATION (ORIF) DISTAL RADIAL FRACTURE;  Surgeon: Roseanne Kaufman, MD;  Location: [place];  Service: Orthopedics;  Laterality: Right;  DVR Crosslock, MD available sometime around 1430-1500  . ORIF ANKLE FRACTURE Right 11/10/2019   Procedure: POSSIBLEOPEN REDUCTION INTERNAL FIXATION (ORIF) ANKLE FRACTURE;  Surgeon: Shona Needles, MD;  Location: [place];  Service: Orthopedics;  Laterality:  Right;  Marland Kitchen ORIF WRIST FRACTURE Left 11/10/2019   Procedure: OPEN REDUCTION INTERNAL FIXATION (ORIF) WRIST FRACTURE;  Surgeon: Shona Needles, MD;  Location: [place];  Service: Orthopedics;  Laterality: Left;  . TONSILLECTOMY    . TOTAL KNEE ARTHROPLASTY Right 08/05/2016  . TOTAL KNEE ARTHROPLASTY Right 08/05/2016   Procedure: RIGHT TOTAL KNEE ARTHROPLASTY;  Surgeon: Mcarthur Rossetti, MD;  Location: [place];  Service: Orthopedics;  Laterality: Right;   Current Outpatient Medications on File Prior to Visit  Medication Sig Dispense Refill  . acetaminophen (TYLENOL) 650 MG CR tablet Take 1,300 mg by mouth at bedtime.    Marland Kitchen  atorvastatin (LIPITOR) 40 MG tablet Take 1 tablet (40 mg total) by mouth daily. 90 tablet 3  . b complex vitamins capsule Take 1 capsule by mouth daily.    . Calcium Carb-Cholecalciferol (CALCIUM 600+D3 PO) Take 1 tablet by mouth in the morning and at bedtime.    . Cholecalciferol (VITAMIN D-3) 25 MCG (1000 UT) CAPS Take 1,000 Units by mouth daily with breakfast.    . citalopram (CELEXA) 20 MG tablet Take 1 tablet (20 mg total) by mouth at bedtime. 90 tablet 3  . cyclobenzaprine (FLEXERIL) 10 MG tablet TAKE 1 TABLET (10 MG TOTAL) BY MOUTH 3 (THREE) TIMES DAILY AS NEEDED FOR MUSCLE SPASMS. 270 tablet 1  . docusate sodium (COLACE) 100 MG capsule Take 1 capsule (100 mg total) by mouth 2 (two) times daily. 60 capsule 0  . fluticasone (FLONASE) 50 MCG/ACT nasal spray Place 2 sprays into both nostrils daily as needed (for seasonal allergies).    . loratadine (CLARITIN) 10 MG tablet Take 10 mg by mouth at bedtime.    . Melatonin 10 MG TABS Take 10 mg by mouth at bedtime.    . Multiple Vitamins-Minerals (ONE-A-DAY WOMENS PO) Take 1 tablet by mouth daily with breakfast.    . naproxen sodium (ALEVE) 220 MG tablet Take 220 mg by mouth 2 (two) times daily as needed (for pain).    . Omega-3 Fatty Acids (FISH OIL) 1000 MG CAPS Take 1,000 mg by mouth daily after breakfast.    . pantoprazole (PROTONIX) 40 MG tablet Take 1 tablet (40 mg total) by mouth daily. 90 tablet 4  . polyethylene glycol (MIRALAX / GLYCOLAX) 17 g packet Take 17 g by mouth daily.    . Wheat Dextrin (BENEFIBER) POWD Take by mouth. 2 teaspoons daily     No current facility-administered medications on file prior to visit.   Allergies  Allergen Reactions  . Ciprofloxacin Itching, Dermatitis and Rash  . Penicillins Rash and Other (See Comments)    Has patient had a PCN reaction causing immediate rash, facial/tongue/throat swelling, SOB or lightheadedness with hypotension: Yes Has patient had a PCN reaction causing severe rash involving mucus  membranes or skin necrosis: Unknown Has patient had a PCN reaction that required hospitalization: No Has patient had a PCN reaction occurring within the last 10 years: No If all of the above answers are "NO", then may proceed with Cephalosporin use. Childhood reaction.  . Sulfa Antibiotics Rash   Social History   Socioeconomic History  . Marital status: Divorced    Spouse name: Not on file  . Number of children: 2  . Years of education: college  . Highest education level: Not on file  Occupational History  . 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 Smoking status:  Former Smoker    Quit date: 10/27/1993    Years since quitting: 26.6  . Smokeless tobacco: Never Used  Vaping Use  . Vaping Use: Never used  Substance and Sexual Activity  . Alcohol use: Yes    Comment: one drink per week  . Drug use: No  . Sexual activity: Not Currently  Other Topics Concern  . Not on file  Social History Narrative  . Not on file   Social Determinants of Health   Financial Resource Strain: Not on file  Food Insecurity: Not on file  Transportation Needs: Not on file  Physical Activity: Not on file  Stress: Not on file  Social Connections: Not on file  Intimate Partner Violence: Not on file   Family History  Problem Relation Age of Onset  . Stroke Mother   . Arthritis Mother   . Thyroid disease Mother   . Cancer Father   . Arthritis Sister   . Thyroid disease Sister   . Cancer Maternal Grandmother   . Stroke Maternal Grandfather   . Cancer Maternal Grandfather   . Colon cancer Maternal Grandfather   . Cancer Paternal Grandmother   . Heart disease Paternal Grandmother   . Colon cancer Paternal Grandmother   . Stroke Paternal Grandfather   . Arthritis Sister   . Obesity Sister   . Sudden death Neg Hx   . Hypertension Neg Hx   . Hyperlipidemia Neg Hx   . Heart attack Neg Hx   . Diabetes Neg Hx   . Rectal cancer  Neg Hx   . Stomach cancer Neg Hx   . Esophageal cancer Neg Hx       Review of Systems  All other systems reviewed and are negative.      Objective:   Physical Exam Vitals reviewed.  Constitutional:      General: She is not in acute distress.    Appearance: She is obese. She is not ill-appearing, toxic-appearing or diaphoretic.  HENT:     Head: Normocephalic and atraumatic.     Right Ear: Tympanic membrane, ear canal and external ear normal. There is no impacted cerumen.     Left Ear: Tympanic membrane, ear canal and external ear normal. There is no impacted cerumen.     Nose: Nose normal. No congestion or rhinorrhea.     Mouth/Throat:     Mouth: Mucous membranes are moist.     Pharynx: Oropharynx is clear. No oropharyngeal exudate or posterior oropharyngeal erythema.  Eyes:     General: No scleral icterus.       Right eye: No discharge.        Left eye: No discharge.     Extraocular Movements: Extraocular movements intact.     Conjunctiva/sclera: Conjunctivae normal.     Pupils: Pupils are equal, round, and reactive to light.  Neck:     Vascular: No carotid bruit.  Cardiovascular:     Rate and Rhythm: Normal rate and regular rhythm.     Pulses: Normal pulses.     Heart sounds: Normal heart sounds. No murmur heard. No friction rub. No gallop.   Pulmonary:     Effort: Pulmonary effort is normal. No respiratory distress.     Breath sounds: Normal breath sounds. No stridor. No wheezing, rhonchi or rales.  Chest:     Chest wall: No tenderness.  Abdominal:     General: Bowel sounds are normal. There is no distension.     Palpations: Abdomen is soft.  There is no mass.     Tenderness: There is no abdominal tenderness. There is no right CVA tenderness, left CVA tenderness, guarding or rebound.     Hernia: No hernia is present.  Musculoskeletal:     Cervical back: Normal range of motion and neck supple. No rigidity. No muscular tenderness.     Right lower leg: No edema.      Left lower leg: No edema.  Lymphadenopathy:     Cervical: No cervical adenopathy.  Skin:    General: Skin is warm.     Coloration: Skin is not jaundiced or pale.     Findings: No bruising, erythema, lesion or rash.  Neurological:     General: No focal deficit present.     Mental Status: She is alert and oriented to person, place, and time. Mental status is at baseline.     Cranial Nerves: No cranial nerve deficit.     Sensory: No sensory deficit.     Motor: No weakness.     Coordination: Coordination normal.     Gait: Gait normal.     Deep Tendon Reflexes: Reflexes normal.  Psychiatric:        Mood and Affect: Mood normal.        Behavior: Behavior normal.        Thought Content: Thought content normal.        Judgment: Judgment normal.           Assessment & Plan:  Postmenopausal estrogen deficiency - Plan: DG Bone Density  Pure hypercholesterolemia - Plan: COMPLETE METABOLIC PANEL WITH GFR, Lipid panel  General medical exam  Ischemic colitis [facility])  Patient denies any memory loss but she has been battling depression.  Therefore I recommend we discontinue Celexa and will replace it with venlafaxine extended release 75 mg p.o. every morning.  Increase to 150 mg p.o. every morning in 2 weeks and then reassess in 4 to 6 weeks.  Mammogram is up-to-date.  Colonoscopy is up-to-date.  I will schedule the patient for a bone density test.  She is already taking calcium and vitamin D.  I will also schedule the patient for a Pap smear however she would like to see a female provider.  She will can schedule this at her earliest convenience.  She received Pneumovax 23 today.  She also received her tetanus vaccine today.  I recommended her second dose of Shingrix.  She denies any memory loss.  She denies any recent falls other than the fall that fractured her wrist.  However I still feel that the patient is a low fall risk.  She simply tripped.  Blood pressures well controlled.

## 2020-06-20 ENCOUNTER — Encounter: Payer: Self-pay | Admitting: Family Medicine

## 2020-06-20 LAB — HM DIABETES EYE EXAM

## 2020-06-26 ENCOUNTER — Encounter: Payer: Self-pay | Admitting: *Deleted

## 2020-07-09 ENCOUNTER — Encounter: Payer: Self-pay | Admitting: Nurse Practitioner

## 2020-07-09 ENCOUNTER — Ambulatory Visit (INDEPENDENT_AMBULATORY_CARE_PROVIDER_SITE_OTHER): Payer: Medicare Other | Admitting: Nurse Practitioner

## 2020-07-09 ENCOUNTER — Other Ambulatory Visit: Payer: Self-pay | Admitting: Family Medicine

## 2020-07-09 ENCOUNTER — Other Ambulatory Visit: Payer: Self-pay

## 2020-07-09 VITALS — BP 136/78 | HR 94 | Temp 99.0°F | Ht 71.0 in | Wt 286.2 lb

## 2020-07-09 DIAGNOSIS — Z124 Encounter for screening for malignant neoplasm of cervix: Secondary | ICD-10-CM

## 2020-07-09 DIAGNOSIS — Z01419 Encounter for gynecological examination (general) (routine) without abnormal findings: Secondary | ICD-10-CM

## 2020-07-09 NOTE — Addendum Note (Signed)
Addended by: Amalia Hailey on: 07/09/2020 03:50 PM   Modules accepted: Orders

## 2020-07-09 NOTE — Addendum Note (Signed)
Addended by: Amalia Hailey on: 07/09/2020 03:48 PM   Modules accepted: Orders

## 2020-07-09 NOTE — Progress Notes (Signed)
Subjective:    [name], female    DOB: Jul 02, 1955, 65 y.o.   MRN: [id number]  HPI: Monique Gonzalez is a 65 y.o. female presenting for  Chief Complaint  Patient presents with  . Gynecologic Exam    Cpe completed by pcp, just needs pap   Gynecologic examination.    Allergies  Allergen Reactions  . Ciprofloxacin Itching, Dermatitis and Rash  . Penicillins Rash and Other (See Comments)    Has patient had a PCN reaction causing immediate rash, facial/tongue/throat swelling, SOB or lightheadedness with hypotension: Yes Has patient had a PCN reaction causing severe rash involving mucus membranes or skin necrosis: Unknown Has patient had a PCN reaction that required hospitalization: No Has patient had a PCN reaction occurring within the last 10 years: No If all of the above answers are "NO", then may proceed with Cephalosporin use. Childhood reaction.  . Sulfa Antibiotics Rash    Outpatient Encounter Medications as of 07/09/2020  Medication Sig  . acetaminophen (TYLENOL) 650 MG CR tablet Take 1,300 mg by mouth at bedtime.  Marland Kitchen atorvastatin (LIPITOR) 40 MG tablet Take 1 tablet (40 mg total) by mouth daily.  Marland Kitchen b complex vitamins capsule Take 1 capsule by mouth daily.  . Calcium Carb-Cholecalciferol (CALCIUM 600+D3 PO) Take 1 tablet by mouth in the morning and at bedtime.  . Cholecalciferol (VITAMIN D-3) 25 MCG (1000 UT) CAPS Take 1,000 Units by mouth daily with breakfast.  . COVID-19 mRNA vaccine, Pfizer, 30 MCG/0.3ML injection AS DIRECTED  . cyclobenzaprine (FLEXERIL) 10 MG tablet TAKE 1 TABLET (10 MG TOTAL) BY MOUTH 3 (THREE) TIMES DAILY AS NEEDED FOR MUSCLE SPASMS.  Marland Kitchen docusate sodium (COLACE) 100 MG capsule Take 1 capsule (100 mg total) by mouth 2 (two) times daily.  . fluticasone (FLONASE) 50 MCG/ACT nasal spray Place 2 sprays into both nostrils daily as needed (for seasonal allergies).  . loratadine (CLARITIN) 10 MG tablet Take 10 mg by mouth at bedtime.  .  Melatonin 10 MG TABS Take 10 mg by mouth at bedtime.  . Multiple Vitamins-Minerals (ONE-A-DAY WOMENS PO) Take 1 tablet by mouth daily with breakfast.  . naproxen sodium (ALEVE) 220 MG tablet Take 220 mg by mouth 2 (two) times daily as needed (for pain).  . Omega-3 Fatty Acids (FISH OIL) 1000 MG CAPS Take 1,000 mg by mouth daily after breakfast.  . pantoprazole (PROTONIX) 40 MG tablet Take 1 tablet (40 mg total) by mouth daily.  . polyethylene glycol (MIRALAX / GLYCOLAX) 17 g packet Take 17 g by mouth daily.  Marland Kitchen venlafaxine XR (EFFEXOR XR) 75 MG 24 hr capsule Take 2 capsules (150 mg total) by mouth daily with breakfast.  . Wheat Dextrin (BENEFIBER) POWD Take by mouth. 2 teaspoons daily  . citalopram (CELEXA) 20 MG tablet Take 1 tablet (20 mg total) by mouth at bedtime.   No facility-administered encounter medications on file as of 07/09/2020.    Patient Active Problem List   Diagnosis Date Noted  . Ischemic stricture intestine ([place])   . Colitis 02/19/2020  . Closed fracture of left distal radius 11/10/2019  . Fall 11/10/2019  . Closed right ankle fracture 11/09/2019  . Ankle fracture 11/09/2019  . Closed displaced fracture of styloid process of left ulna   . Anxiety and depression 12/22/2016  . Insulin resistance 10/15/2016  . Presence of right artificial knee joint 10/14/2016  . Unilateral primary osteoarthritis, right knee 05/20/2016  . Class 2 obesity without serious comorbidity  with body mass index (BMI) of 37.0 to 37.9 in adult 04/08/2016  . Vitamin D deficiency 04/08/2016  . Shortness of breath 03/25/2016  . Constipation 09/20/2013  . Recurrent cold sores   . Right knee pain 11/23/2011  . Osteoarthritis   . Depression   . Hyperlipidemia     Past Medical History:  Diagnosis Date  . Allergy   . Anxiety    Phreesia 06/04/2020  . Arthritis    Phreesia 07/08/2020  . Chronic pain    back and knee  . Colitis 09/2012   infectious vs inflammatory.   . Colon stricture ([place])    . Depression   . Depression    Phreesia 06/04/2020  . Depression    Phreesia 07/08/2020  . GERD (gastroesophageal reflux disease)    Phreesia 06/04/2020  . Hyperlipidemia   . Obesity   . Osteoarthritis   . Osteopenia   . Recurrent cold sores   . Seasonal allergies   . Syncope and collapse 11/30/2012   Normal EEG.  Vaso vagal syncope    Relevant past medical, surgical, family and social history reviewed and updated as indicated. Interim medical history since our last visit reviewed.  Review of Systems  Per HPI unless specifically indicated above     Objective:    BP 136/78   Pulse 94   Temp 99 F (37.2 C)   Ht 5\' 11"  (1.803 m)   Wt 286 lb 3.2 oz (129.8 kg)   SpO2 96%   BMI 39.92 kg/m   Wt Readings from Last 3 Encounters:  07/09/20 286 lb 3.2 oz (129.8 kg)  06/05/20 290 lb (131.5 kg)  05/02/20 298 lb 4 oz (135.3 kg)    Physical Exam Vitals and nursing note reviewed. Exam conducted with a chaperone present (AW).  Genitourinary:    General: Normal vulva.     Exam position: Lithotomy position.     Pubic Area: No rash.      Labia:        Right: No rash, tenderness or lesion.        Left: No rash, tenderness or lesion.      Vagina: Normal. No vaginal discharge.     Cervix: Lesion present. No cervical motion tenderness or erythema.     Uterus: Normal.      Adnexa: Right adnexa normal and left adnexa normal.       Right: No tenderness or fullness.         Left: No tenderness or fullness.          Comments: Polyp noted to cervix  Lymphadenopathy:     Lower Body: No right inguinal adenopathy. No left inguinal adenopathy.  Neurological:     Mental Status: She is oriented to person, place, and time.  Psychiatric:        Mood and Affect: Mood normal.        Behavior: Behavior normal.        Thought Content: Thought content normal.        Judgment: Judgment normal.       Assessment & Plan:   Problem List Items Addressed This Visit   None   Visit Diagnoses     Screening for cervical cancer    -  Primary   Relevant Orders   PAP,TP IMGw/HPV RNA,rflx [id number],00/86   Encounter for gynecological examination           Follow up plan: Return for as scheduled with PCP.

## 2020-07-12 LAB — PAP, TP IMAGING W/ HPV RNA, RFLX HPV TYPE 16,18/45: HPV DNA High Risk: NOT DETECTED

## 2020-10-02 ENCOUNTER — Ambulatory Visit: Payer: Medicare Other | Attending: Internal Medicine

## 2020-10-02 DIAGNOSIS — Z23 Encounter for immunization: Secondary | ICD-10-CM

## 2020-10-02 NOTE — Progress Notes (Signed)
   Covid-19 Vaccination Clinic  Name:  Monique Gonzalez    MRN: [place]:[id number] DOB: 01-23-1956  10/02/2020  [name] was observed post Covid-19 immunization for 15 minutes without incident. She was provided with Vaccine Information Sheet and instruction to access the V-Safe system.   [name] was instructed to call 911 with any severe reactions post vaccine: Difficulty breathing  Swelling of face and throat  A fast heartbeat  A bad rash all over body  Dizziness and weakness   Immunizations Administered     Name Date Dose VIS Date Route   PFIZER Comrnaty(Gray TOP) Covid-19 Vaccine 10/02/2020 11:07 AM 0.3 mL 02/16/2020 Intramuscular   Manufacturer: [place]   Lot: [id number]   [place]: [phone]

## 2020-10-05 ENCOUNTER — Other Ambulatory Visit (HOSPITAL_BASED_OUTPATIENT_CLINIC_OR_DEPARTMENT_OTHER): Payer: Self-pay

## 2020-10-05 MED ORDER — COVID-19 MRNA VAC-TRIS(PFIZER) 30 MCG/0.3ML IM SUSP
INTRAMUSCULAR | 0 refills | Status: DC
  Filled 2020-10-05: qty 0.3, 1d supply, fill #0

## 2020-11-16 ENCOUNTER — Other Ambulatory Visit (HOSPITAL_BASED_OUTPATIENT_CLINIC_OR_DEPARTMENT_OTHER): Payer: Self-pay

## 2020-11-19 ENCOUNTER — Other Ambulatory Visit: Payer: Self-pay | Admitting: Family Medicine

## 2020-11-19 DIAGNOSIS — M545 Low back pain, unspecified: Secondary | ICD-10-CM

## 2020-11-19 DIAGNOSIS — G8929 Other chronic pain: Secondary | ICD-10-CM

## 2020-11-19 NOTE — Telephone Encounter (Signed)
Ok to refill 

## 2021-01-11 ENCOUNTER — Other Ambulatory Visit (HOSPITAL_BASED_OUTPATIENT_CLINIC_OR_DEPARTMENT_OTHER): Payer: Self-pay

## 2021-01-11 ENCOUNTER — Ambulatory Visit: Payer: Medicare Other | Attending: Internal Medicine

## 2021-01-11 DIAGNOSIS — Z23 Encounter for immunization: Secondary | ICD-10-CM

## 2021-01-11 MED ORDER — INFLUENZA VAC A&B SA ADJ QUAD 0.5 ML IM PRSY
PREFILLED_SYRINGE | INTRAMUSCULAR | 0 refills | Status: DC
  Filled 2021-01-11: qty 0.5, 1d supply, fill #0

## 2021-01-11 NOTE — Progress Notes (Signed)
   Covid-19 Vaccination Clinic  Name:  Monique Gonzalez    MRN: [id number] DOB: 02-06-1956  01/11/2021  [name] was observed post Covid-19 immunization for 15 minutes without incident. She was provided with Vaccine Information Sheet and instruction to access the V-Safe system.   [name] was instructed to call 911 with any severe reactions post vaccine: Difficulty breathing  Swelling of face and throat  A fast heartbeat  A bad rash all over body  Dizziness and weakness   Immunizations Administered     Name Date Dose VIS Date Route   Pfizer Covid-19 Vaccine Bivalent Booster 01/11/2021 12:48 PM 0.3 mL 11/07/2020 Intramuscular   Manufacturer: [place]   Lot: [id number]   [place]: [phone]

## 2021-01-29 ENCOUNTER — Other Ambulatory Visit (HOSPITAL_BASED_OUTPATIENT_CLINIC_OR_DEPARTMENT_OTHER): Payer: Self-pay

## 2021-01-29 MED ORDER — PFIZER COVID-19 VAC BIVALENT 30 MCG/0.3ML IM SUSP
INTRAMUSCULAR | 0 refills | Status: DC
  Filled 2021-01-29: qty 0.3, 1d supply, fill #0

## 2021-03-26 LAB — HM MAMMOGRAPHY

## 2021-04-20 ENCOUNTER — Other Ambulatory Visit (HOSPITAL_COMMUNITY): Payer: Self-pay

## 2021-05-06 ENCOUNTER — Other Ambulatory Visit: Payer: Self-pay

## 2021-05-07 ENCOUNTER — Other Ambulatory Visit: Payer: Self-pay

## 2021-05-07 ENCOUNTER — Ambulatory Visit (INDEPENDENT_AMBULATORY_CARE_PROVIDER_SITE_OTHER): Payer: Medicare Other | Admitting: Family Medicine

## 2021-05-07 ENCOUNTER — Encounter: Payer: Self-pay | Admitting: Family Medicine

## 2021-05-07 VITALS — BP 124/78 | HR 80 | Temp 97.3°F | Resp 18 | Ht 71.0 in | Wt 279.0 lb

## 2021-05-07 DIAGNOSIS — B9689 Other specified bacterial agents as the cause of diseases classified elsewhere: Secondary | ICD-10-CM

## 2021-05-07 DIAGNOSIS — J019 Acute sinusitis, unspecified: Secondary | ICD-10-CM

## 2021-05-07 MED ORDER — PREDNISONE 20 MG PO TABS: ORAL_TABLET | ORAL | 0 refills | Status: DC

## 2021-05-07 MED ORDER — AZITHROMYCIN 250 MG PO TABS: ORAL_TABLET | ORAL | 0 refills | Status: DC

## 2021-05-07 NOTE — Progress Notes (Signed)
Subjective:    [name], female    DOB: [birth year], 66 y.o.   MRN: [id number]  HPI Patient's symptoms have gone on for 3 to 4 weeks.  She reports a burning stinging pain in her frontal sinuses.  She reports a pressure-like sensation.  She has been using Nasacort.  At times she will feel like "things just let go".  Then she will have copious rhinorrhea and congestion.  She will have pink-tinged nasal mucus that she is blowing copious amounts out of her nose.  However shortly thereafter it was stopped back up and she will have pressure and pain in her frontal sinuses.  Her head hurts if she leans forward.  She also has some dizziness.  She denies any fevers or chills.  She denies any otalgia.  She denies any sore throat.  She denies any cough or chest pain.  She denies any strokelike symptoms.  She denies any numbness or tingling anywhere in her body.  She denies any ataxia. Past Medical History:  Diagnosis Date   Allergy    Anxiety    Phreesia 06/04/2020   Arthritis    Phreesia 07/08/2020   Chronic pain    back and knee   Colitis 09/2012   infectious vs inflammatory.    Colon stricture [facility])    Depression    Depression    Phreesia 06/04/2020   Depression    Phreesia 07/08/2020   GERD (gastroesophageal reflux disease)    Phreesia 06/04/2020   Hyperlipidemia    Obesity    Osteoarthritis    Osteopenia    Recurrent cold sores    Seasonal allergies    Syncope and collapse 11/30/2012   Normal EEG.  Vaso vagal syncope   Past Surgical History:  Procedure Laterality Date   BIOPSY  02/21/2020   Procedure: BIOPSY;  Surgeon: Gatha Mayer, MD;  Location: [place];  Service: Endoscopy;;   COLON RESECTION N/A 10/14/2013   Procedure: LAPAROSCOPIC MOBILIZATION OF SPLENIC FLEXURE, LAPAROSCOPIC EXTENDED LEFT COLECTOMY;  Surgeon: [name], MD;  Location: [place];  Service: General;  Laterality: N/A;   COLON SURGERY N/A    Phreesia 06/04/2020   COLONOSCOPY N/A 10/12/2013    Procedure: COLONOSCOPY;  Surgeon: [name], MD;  Location: [place];  Service: Endoscopy;  Laterality: N/A;   COLONOSCOPY WITH PROPOFOL N/A 02/21/2020   Procedure: COLONOSCOPY WITH PROPOFOL;  Surgeon: Gatha Mayer, MD;  Location: [facility] ENDOSCOPY;  Service: Endoscopy;  Laterality: N/A;   DILATION AND CURETTAGE, DIAGNOSTIC / THERAPEUTIC  1987   FRACTURE SURGERY N/A    Phreesia 06/04/2020   JOINT REPLACEMENT N/A    Phreesia 06/04/2020   OPEN REDUCTION INTERNAL FIXATION (ORIF) DISTAL RADIAL FRACTURE Right 08/08/2014   Procedure: OPEN REDUCTION INTERNAL FIXATION (ORIF) DISTAL RADIAL FRACTURE;  Surgeon: Roseanne Kaufman, MD;  Location: [place];  Service: Orthopedics;  Laterality: Right;  DVR Crosslock, MD available sometime around 1430-1500   ORIF ANKLE FRACTURE Right 11/10/2019   Procedure: POSSIBLEOPEN REDUCTION INTERNAL FIXATION (ORIF) ANKLE FRACTURE;  Surgeon: Shona Needles, MD;  Location: [place];  Service: Orthopedics;  Laterality: Right;   ORIF WRIST FRACTURE Left 11/10/2019   Procedure: OPEN REDUCTION INTERNAL FIXATION (ORIF) WRIST FRACTURE;  Surgeon: Shona Needles, MD;  Location: [place];  Service: Orthopedics;  Laterality: Left;   TONSILLECTOMY     TOTAL KNEE ARTHROPLASTY Right 08/05/2016   TOTAL KNEE ARTHROPLASTY Right 08/05/2016   Procedure: RIGHT TOTAL KNEE ARTHROPLASTY;  Surgeon:  Mcarthur Rossetti, MD;  Location: [place];  Service: Orthopedics;  Laterality: Right;   Current Outpatient Medications on File Prior to Visit  Medication Sig Dispense Refill   acetaminophen (TYLENOL) 650 MG CR tablet Take 1,300 mg by mouth at bedtime.     atorvastatin (LIPITOR) 40 MG tablet TAKE 1 TABLET EVERY DAY 90 tablet 3   b complex vitamins capsule Take 1 capsule by mouth daily.     Calcium Carb-Cholecalciferol (CALCIUM 600+D3 PO) Take 1 tablet by mouth in the morning and at bedtime.     Cholecalciferol (VITAMIN D-3) 25 MCG (1000 UT) CAPS Take 1,000 Units by mouth daily with breakfast.      cyclobenzaprine (FLEXERIL) 10 MG tablet TAKE 1 TABLET THREE TIMES DAILY AS NEEDED FOR MUSCLE SPASM(S) 270 tablet 1   docusate sodium (COLACE) 100 MG capsule Take 1 capsule (100 mg total) by mouth 2 (two) times daily. 60 capsule 0   loratadine (CLARITIN) 10 MG tablet Take 10 mg by mouth at bedtime.     Melatonin 10 MG TABS Take 10 mg by mouth at bedtime.     Multiple Vitamins-Minerals (ONE-A-DAY WOMENS PO) Take 1 tablet by mouth daily with breakfast.     Omega-3 Fatty Acids (FISH OIL) 1000 MG CAPS Take 1,000 mg by mouth daily after breakfast.     pantoprazole (PROTONIX) 40 MG tablet TAKE 1 TABLET EVERY DAY 90 tablet 4   polyethylene glycol (MIRALAX / GLYCOLAX) 17 g packet Take 17 g by mouth daily.     venlafaxine XR (EFFEXOR XR) 75 MG 24 hr capsule Take 2 capsules (150 mg total) by mouth daily with breakfast. 180 capsule 3   Wheat Dextrin (BENEFIBER) POWD Take by mouth. 2 teaspoons daily     No current facility-administered medications on file prior to visit.   Allergies  Allergen Reactions   Ciprofloxacin Itching, Dermatitis and Rash   Penicillins Rash and Other (See Comments)    Has patient had a PCN reaction causing immediate rash, facial/tongue/throat swelling, SOB or lightheadedness with hypotension: Yes Has patient had a PCN reaction causing severe rash involving mucus membranes or skin necrosis: Unknown Has patient had a PCN reaction that required hospitalization: No Has patient had a PCN reaction occurring within the last 10 years: No If all of the above answers are "NO", then may proceed with Cephalosporin use. Childhood reaction.   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    Employer: [employer]    Comment: LPN  Tobacco Use    Smoking status: Former    Types: Cigarettes    Quit date: 10/27/1993    Years since quitting: 27.5   Smokeless tobacco: Never  Vaping Use   Vaping Use: Never used  Substance and Sexual Activity   Alcohol use: Yes    Comment: one drink per week   Drug use: No   Sexual activity: Not Currently  Other Topics Concern   Not on file  Social History Narrative   Not on file   Social Determinants of Health   Financial Resource Strain: Not on file  Food Insecurity: Not on file  Transportation Needs: Not on file  Physical Activity: Not on file  Stress: Not on  file  Social Connections: Not on file  Intimate Partner Violence: Not on file   Family History  Problem Relation Age of Onset   Stroke Mother    Arthritis Mother    Thyroid disease Mother    Cancer Father    Arthritis Sister    Thyroid disease Sister    Cancer Maternal Grandmother    Stroke Maternal Grandfather    Cancer Maternal Grandfather    Colon cancer Maternal Grandfather    Cancer Paternal Grandmother    Heart disease Paternal Grandmother    Colon cancer Paternal Grandmother    Stroke Paternal Grandfather    Arthritis Sister    Obesity Sister    Sudden death Neg Hx    Hypertension Neg Hx    Hyperlipidemia Neg Hx    Heart attack Neg Hx    Diabetes Neg Hx    Rectal cancer Neg Hx    Stomach cancer Neg Hx    Esophageal cancer Neg Hx       Review of Systems  All other systems reviewed and are negative.     Objective:   Physical Exam Vitals reviewed.  Constitutional:      General: She is not in acute distress.    Appearance: She is obese. She is not ill-appearing, toxic-appearing or diaphoretic.  HENT:     Right Ear: Tympanic membrane and ear canal normal.     Left Ear: Tympanic membrane and ear canal normal.     Nose: Congestion and rhinorrhea present.     Mouth/Throat:     Mouth: Mucous membranes are moist.     Pharynx: Oropharynx is clear. No oropharyngeal exudate or posterior oropharyngeal  erythema.  Cardiovascular:     Rate and Rhythm: Normal rate and regular rhythm.     Pulses: Normal pulses.     Heart sounds: Normal heart sounds. No murmur heard.   No friction rub. No gallop.  Pulmonary:     Effort: Pulmonary effort is normal. No respiratory distress.     Breath sounds: Normal breath sounds. No stridor. No wheezing, rhonchi or rales.  Chest:     Chest wall: No tenderness.  Musculoskeletal:     Cervical back: No muscular tenderness.  Lymphadenopathy:     Cervical: No cervical adenopathy.  Neurological:     Mental Status: She is alert.          Assessment & Plan:  Acute bacterial rhinosinusitis I believe the patient has a sinus infection.  We will try Z-Pak due to her allergy profile and use a prednisone taper pack.  Reassess in 1 week if no better or sooner if worsening.

## 2021-06-06 ENCOUNTER — Ambulatory Visit (INDEPENDENT_AMBULATORY_CARE_PROVIDER_SITE_OTHER): Payer: Medicare Other | Admitting: Family Medicine

## 2021-06-06 ENCOUNTER — Encounter: Payer: Self-pay | Admitting: Family Medicine

## 2021-06-06 VITALS — BP 122/74 | HR 99 | Temp 97.1°F | Ht 70.0 in | Wt 276.2 lb

## 2021-06-06 DIAGNOSIS — Z0000 Encounter for general adult medical examination without abnormal findings: Secondary | ICD-10-CM

## 2021-06-06 DIAGNOSIS — K559 Vascular disorder of intestine, unspecified: Secondary | ICD-10-CM

## 2021-06-06 DIAGNOSIS — G9519 Other vascular myelopathies: Secondary | ICD-10-CM

## 2021-06-06 DIAGNOSIS — Z780 Asymptomatic menopausal state: Secondary | ICD-10-CM

## 2021-06-06 DIAGNOSIS — E7800 Pure hypercholesterolemia, unspecified: Secondary | ICD-10-CM

## 2021-06-06 LAB — CBC WITH DIFFERENTIAL/PLATELET
Absolute Monocytes: 505 cells/uL (ref 200–950)
Basophils Absolute: 41 cells/uL (ref 0–200)
Basophils Relative: 0.8 %
Eosinophils Absolute: 41 cells/uL (ref 15–500)
Eosinophils Relative: 0.8 %
HCT: 38.9 % (ref 35.0–45.0)
Hemoglobin: 12.7 g/dL (ref 11.7–15.5)
Lymphs Abs: 1622 cells/uL (ref 850–3900)
MCH: 28.6 pg (ref 27.0–33.0)
MCHC: 32.6 g/dL (ref 32.0–36.0)
MCV: 87.6 fL (ref 80.0–100.0)
MPV: 9.6 fL (ref 7.5–12.5)
Monocytes Relative: 9.9 %
Neutro Abs: 2892 cells/uL (ref 1500–7800)
Neutrophils Relative %: 56.7 %
Platelets: 266 10*3/uL (ref 140–400)
RBC: 4.44 10*6/uL (ref 3.80–5.10)
RDW: 14 % (ref 11.0–15.0)
Total Lymphocyte: 31.8 %
WBC: 5.1 10*3/uL (ref 3.8–10.8)

## 2021-06-06 LAB — COMPLETE METABOLIC PANEL WITH GFR
AG Ratio: 1.9 (calc) (ref 1.0–2.5)
ALT: 14 U/L (ref 6–29)
AST: 15 U/L (ref 10–35)
Albumin: 4.3 g/dL (ref 3.6–5.1)
Alkaline phosphatase (APISO): 51 U/L (ref 37–153)
BUN: 17 mg/dL (ref 7–25)
CO2: 29 mmol/L (ref 20–32)
Calcium: 9.7 mg/dL (ref 8.6–10.4)
Chloride: 104 mmol/L (ref 98–110)
Creat: 0.99 mg/dL (ref 0.50–1.05)
Globulin: 2.3 g/dL (calc) (ref 1.9–3.7)
Glucose, Bld: 101 mg/dL — ABNORMAL HIGH (ref 65–99)
Potassium: 4.5 mmol/L (ref 3.5–5.3)
Sodium: 141 mmol/L (ref 135–146)
Total Bilirubin: 0.4 mg/dL (ref 0.2–1.2)
Total Protein: 6.6 g/dL (ref 6.1–8.1)
eGFR: 62 mL/min/{1.73_m2} (ref >=60)

## 2021-06-06 LAB — LIPID PANEL
Cholesterol: 202 mg/dL — ABNORMAL HIGH (ref <200)
HDL: 44 mg/dL — ABNORMAL LOW (ref >=50)
LDL Cholesterol (Calc): 121 mg/dL (calc) — ABNORMAL HIGH
Non-HDL Cholesterol (Calc): 158 mg/dL (calc) — ABNORMAL HIGH (ref <130)
Total CHOL/HDL Ratio: 4.6 (calc) (ref <5.0)
Triglycerides: 242 mg/dL — ABNORMAL HIGH (ref <150)

## 2021-06-06 NOTE — Progress Notes (Signed)
? ?Subjective:  ? ? [name], female    DOB: 07/12/1955, 66 y.o.   MRN: [id number] ? ?HPI ?Patient is a very pleasant 66 year old Caucasian female who is here today for her annual wellness visit.  She has a history of ischemic colitis on 2 separate occasions in 2015 and 2021.  Both were thought to be due to hypotensive episodes.  Her last CT angiogram was in 2015 and showed no significant blockages.  Overall she has been doing well.  She avoids NSAIDs.  She is trying to exercise and lose weight however she states that her appetite has been a problem for her.  She states that she always feels hungry.  She is also having trouble exercising.  She states if she tries to walk for more than a few minutes, her back seizes up and her.  She is also getting neurogenic claudication.  She states that both legs particular her right leg will go numb if she stands and tries to walk any distance.  Her legs start to feel weak and burning throb.  She has diminished reflexes today on exam.  I am unable to elicit patellar or Achilles reflex at all.  Her strength is still good in her legs but she does have claudication with ambulation and along with the numbness, I suspect neurogenic claudication.  Her immunizations are up-to-date including her shingles vaccine, pneumonia vaccine, and COVID shot.  Her mammogram was done in January and was normal.  Her last regular colonoscopy was 2015 and is not due again until 2025.  She had a bone density last year that was relatively normal. ?Past Medical History:  ?Diagnosis Date  ? Allergy   ? Anxiety   ? Phreesia 06/04/2020  ? Arthritis   ? Phreesia 07/08/2020  ? Chronic pain   ? back and knee  ? Colitis 09/2012  ? infectious vs inflammatory.   ? Colon stricture ([place])   ? Depression   ? Depression   ? Phreesia 06/04/2020  ? Depression   ? Phreesia 07/08/2020  ? GERD (gastroesophageal reflux disease)   ? Phreesia 06/04/2020  ? Hyperlipidemia   ? Obesity   ? Osteoarthritis   ?  Osteopenia   ? Recurrent cold sores   ? Seasonal allergies   ? Syncope and collapse 11/30/2012  ? Normal EEG.  Vaso vagal syncope  ? ?Past Surgical History:  ?Procedure Laterality Date  ? BIOPSY  02/21/2020  ? Procedure: BIOPSY;  Surgeon: Gatha Mayer, MD;  Location: [place];  Service: Endoscopy;;  ? COLON RESECTION N/A 10/14/2013  ? Procedure: LAPAROSCOPIC MOBILIZATION OF SPLENIC FLEXURE, LAPAROSCOPIC EXTENDED LEFT COLECTOMY;  Surgeon: [name], MD;  Location: [place];  Service: General;  Laterality: N/A;  ? COLON SURGERY N/A   ? Phreesia 06/04/2020  ? COLONOSCOPY N/A 10/12/2013  ? Procedure: COLONOSCOPY;  Surgeon: [name], MD;  Location: [facility] ENDOSCOPY;  Service: Endoscopy;  Laterality: N/A;  ? COLONOSCOPY WITH PROPOFOL N/A 02/21/2020  ? Procedure: COLONOSCOPY WITH PROPOFOL;  Surgeon: Gatha Mayer, MD;  Location: [facility] ENDOSCOPY;  Service: Endoscopy;  Laterality: N/A;  ? DILATION AND CURETTAGE, DIAGNOSTIC / THERAPEUTIC  1987  ? FRACTURE SURGERY N/A   ? Phreesia 06/04/2020  ? JOINT REPLACEMENT N/A   ? Phreesia 06/04/2020  ? OPEN REDUCTION INTERNAL FIXATION (ORIF) DISTAL RADIAL FRACTURE Right 08/08/2014  ? Procedure: OPEN REDUCTION INTERNAL FIXATION (ORIF) DISTAL RADIAL FRACTURE;  Surgeon: Roseanne Kaufman, MD;  Location: [place];  Service: Orthopedics;  Laterality: Right;  DVR Crosslock, MD available sometime around 1430-1500  ? ORIF ANKLE FRACTURE Right 11/10/2019  ? Procedure: POSSIBLEOPEN REDUCTION INTERNAL FIXATION (ORIF) ANKLE FRACTURE;  Surgeon: Shona Needles, MD;  Location: [place];  Service: Orthopedics;  Laterality: Right;  ? ORIF WRIST FRACTURE Left 11/10/2019  ? Procedure: OPEN REDUCTION INTERNAL FIXATION (ORIF) WRIST FRACTURE;  Surgeon: Shona Needles, MD;  Location: [place];  Service: Orthopedics;  Laterality: Left;  ? TONSILLECTOMY    ? TOTAL KNEE ARTHROPLASTY Right 08/05/2016  ? TOTAL KNEE ARTHROPLASTY Right 08/05/2016  ? Procedure: RIGHT TOTAL KNEE ARTHROPLASTY;  Surgeon: Mcarthur Rossetti, MD;   Location: [place];  Service: Orthopedics;  Laterality: Right;  ? ?Current Outpatient Medications on File Prior to Visit  ?Medication Sig Dispense Refill  ? acetaminophen (TYLENOL) 650 MG CR tablet Take 1,300 mg by mouth at bedtime.    ? atorvastatin (LIPITOR) 40 MG tablet TAKE 1 TABLET EVERY DAY 90 tablet 3  ? azithromycin (ZITHROMAX) 250 MG tablet 2 tabs poqday1, 1 tab poqday 2-5 6 tablet 0  ? b complex vitamins capsule Take 1 capsule by mouth daily.    ? Calcium Carb-Cholecalciferol (CALCIUM 600+D3 PO) Take 1 tablet by mouth in the morning and at bedtime.    ? Cholecalciferol (VITAMIN D-3) 25 MCG (1000 UT) CAPS Take 1,000 Units by mouth daily with breakfast.    ? cyclobenzaprine (FLEXERIL) 10 MG tablet TAKE 1 TABLET THREE TIMES DAILY AS NEEDED FOR MUSCLE SPASM(S) 270 tablet 1  ? docusate sodium (COLACE) 100 MG capsule Take 1 capsule (100 mg total) by mouth 2 (two) times daily. 60 capsule 0  ? loratadine (CLARITIN) 10 MG tablet Take 10 mg by mouth at bedtime.    ? Melatonin 10 MG TABS Take 10 mg by mouth at bedtime.    ? Multiple Vitamins-Minerals (ONE-A-DAY WOMENS PO) Take 1 tablet by mouth daily with breakfast.    ? Omega-3 Fatty Acids (FISH OIL) 1000 MG CAPS Take 1,000 mg by mouth daily after breakfast.    ? pantoprazole (PROTONIX) 40 MG tablet TAKE 1 TABLET EVERY DAY 90 tablet 4  ? polyethylene glycol (MIRALAX / GLYCOLAX) 17 g packet Take 17 g by mouth daily.    ? predniSONE (DELTASONE) 20 MG tablet 3 tabs poqday 1-2, 2 tabs poqday 3-4, 1 tab poqday 5-6 12 tablet 0  ? venlafaxine XR (EFFEXOR XR) 75 MG 24 hr capsule Take 2 capsules (150 mg total) by mouth daily with breakfast. 180 capsule 3  ? Wheat Dextrin (BENEFIBER) POWD Take by mouth. 2 teaspoons daily    ? ?No current facility-administered medications on file prior to visit.  ? ?Allergies  ?Allergen Reactions  ? Ciprofloxacin Itching, Dermatitis and Rash  ? Penicillins Rash and Other (See Comments)  ?  Has patient had a PCN reaction causing immediate rash,  facial/tongue/throat swelling, SOB or lightheadedness with hypotension: Yes ?Has patient had a PCN reaction causing severe rash involving mucus membranes or skin necrosis: Unknown ?Has patient had a PCN reaction that required hospitalization: No ?Has patient had a PCN reaction occurring within the last 10 years: No ?If all of the above answers are "NO", then may proceed with Cephalosporin use. ?Childhood reaction.  ? Sulfa Antibiotics Rash  ? ?Social History  ? ?Socioeconomic History  ? Marital status: Divorced  ?  Spouse name: Not on file  ? Number of children: 2  ? Years of education: college  ? Highest education level: Not on file  ?Occupational History  ?  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  Employer: [employer]  ?  Comment: LPN  ?Tobacco Use  ? Smoking status: Former  ?  Types: Cigarettes  ?  Quit date: 10/27/1993  ?  Years since quitting: 27.6  ? Smokeless tobacco: Never  ?Vaping Use  ? Vaping Use: Never used  ?Substance and Sexual Activity  ? Alcohol use: Yes  ?  Comment: one drink per week  ? Drug use: No  ? Sexual activity: Not Currently  ?Other Topics Concern  ? Not on file  ?Social History Narrative  ? Not on file  ? ?Social Determinants of Health  ? ?Financial Resource Strain: Not on file  ?Food Insecurity: Not on file  ?Transportation Needs: Not on file  ?Physical Activity: Not on file  ?Stress: Not on file  ?Social Connections: Not on file  ?Intimate Partner Violence: Not on file  ? ?Family History  ?Problem Relation Age of Onset  ? Stroke Mother   ? Arthritis Mother   ? Thyroid disease Mother   ? Cancer Father   ? Arthritis Sister   ? Thyroid disease Sister   ? Cancer Maternal Grandmother   ? Stroke Maternal Grandfather   ? Cancer Maternal Grandfather   ? Colon cancer Maternal Grandfather   ? Cancer Paternal Grandmother   ? Heart disease Paternal Grandmother   ? Colon cancer Paternal Grandmother   ? Stroke Paternal Grandfather   ? Arthritis Sister   ? Obesity Sister   ? Sudden death Neg  Hx   ? Hypertension Neg Hx   ? Hyperlipidemia Neg Hx   ? Heart attack Neg Hx   ? Diabetes Neg Hx   ? Rectal cancer Neg Hx   ? Stomach cancer Neg Hx   ? Esophageal cancer Neg Hx   ? ? ? ? ?Review of Systems

## 2021-06-13 ENCOUNTER — Other Ambulatory Visit: Payer: Self-pay | Admitting: Family Medicine

## 2021-06-22 ENCOUNTER — Ambulatory Visit
Admission: RE | Admit: 2021-06-22 | Discharge: 2021-06-22 | Disposition: A | Discharge status: Home / Self Care | Payer: Medicare Other | Source: Ambulatory Visit | Attending: Family Medicine | Admitting: Family Medicine

## 2021-06-22 DIAGNOSIS — R29818 Other symptoms and signs involving the nervous system: Secondary | ICD-10-CM

## 2021-06-27 ENCOUNTER — Other Ambulatory Visit: Payer: Self-pay

## 2021-06-27 DIAGNOSIS — M48061 Spinal stenosis, lumbar region without neurogenic claudication: Secondary | ICD-10-CM

## 2021-06-27 DIAGNOSIS — G8929 Other chronic pain: Secondary | ICD-10-CM

## 2021-08-12 NOTE — Therapy (Signed)
OUTPATIENT PHYSICAL THERAPY THORACOLUMBAR EVALUATION   Patient Name: Monique Gonzalez MRN: [id number] DOB:10/19/1955, 66 y.o., female Today's Date: 08/13/2021   PT End of Session - 08/13/21 1011     Visit Number 1    Number of Visits 13    Date for PT Re-Evaluation 09/24/21    Authorization Type Medicare & [employer]    PT Start Time 1011    PT Stop Time 1106    PT Time Calculation (min) 55 min    Activity Tolerance Patient tolerated treatment well    Behavior During Therapy [facility] for tasks assessed/performed             Past Medical History:  Diagnosis Date   Allergy    Anxiety    Phreesia 06/04/2020   Arthritis    Phreesia 07/08/2020   Chronic pain    back and knee   Colitis 09/2012   infectious vs inflammatory.    Colon stricture [facility])    Depression    Depression    Phreesia 06/04/2020   Depression    Phreesia 07/08/2020   GERD (gastroesophageal reflux disease)    Phreesia 06/04/2020   Hyperlipidemia    Obesity    Osteoarthritis    Osteopenia    Recurrent cold sores    Seasonal allergies    Syncope and collapse 11/30/2012   Normal EEG.  Vaso vagal syncope   Past Surgical History:  Procedure Laterality Date   BIOPSY  02/21/2020   Procedure: BIOPSY;  Surgeon: Gatha Mayer, MD;  Location: [place];  Service: Endoscopy;;   COLON RESECTION N/A 10/14/2013   Procedure: LAPAROSCOPIC MOBILIZATION OF SPLENIC FLEXURE, LAPAROSCOPIC EXTENDED LEFT COLECTOMY;  Surgeon: [name], MD;  Location: [place];  Service: General;  Laterality: N/A;   COLON SURGERY N/A    Phreesia 06/04/2020   COLONOSCOPY N/A 10/12/2013   Procedure: COLONOSCOPY;  Surgeon: [name], MD;  Location: [place];  Service: Endoscopy;  Laterality: N/A;   COLONOSCOPY WITH PROPOFOL N/A 02/21/2020   Procedure: COLONOSCOPY WITH PROPOFOL;  Surgeon: Gatha Mayer, MD;  Location: [facility] ENDOSCOPY;  Service: Endoscopy;  Laterality: N/A;   DILATION AND CURETTAGE, DIAGNOSTIC / THERAPEUTIC  1987    FRACTURE SURGERY N/A    Phreesia 06/04/2020   JOINT REPLACEMENT N/A    Phreesia 06/04/2020   OPEN REDUCTION INTERNAL FIXATION (ORIF) DISTAL RADIAL FRACTURE Right 08/08/2014   Procedure: OPEN REDUCTION INTERNAL FIXATION (ORIF) DISTAL RADIAL FRACTURE;  Surgeon: Roseanne [place], MD;  Location: [place];  Service: Orthopedics;  Laterality: Right;  DVR Crosslock, MD available sometime around 1430-1500   ORIF ANKLE FRACTURE Right 11/10/2019   Procedure: POSSIBLEOPEN REDUCTION INTERNAL FIXATION (ORIF) ANKLE FRACTURE;  Surgeon: Shona Needles, MD;  Location: [place];  Service: Orthopedics;  Laterality: Right;   ORIF WRIST FRACTURE Left 11/10/2019   Procedure: OPEN REDUCTION INTERNAL FIXATION (ORIF) WRIST FRACTURE;  Surgeon: Shona Needles, MD;  Location: [place];  Service: Orthopedics;  Laterality: Left;   TONSILLECTOMY     TOTAL KNEE ARTHROPLASTY Right 08/05/2016   TOTAL KNEE ARTHROPLASTY Right 08/05/2016   Procedure: RIGHT TOTAL KNEE ARTHROPLASTY;  Surgeon: Mcarthur Rossetti, MD;  Location: [place];  Service: Orthopedics;  Laterality: Right;   Patient Active Problem List   Diagnosis Date Noted   Ischemic stricture intestine ([place])    Colitis 02/19/2020   Closed fracture of left distal radius 11/10/2019   Fall 11/10/2019   Closed right ankle fracture 11/09/2019   Ankle fracture  11/09/2019   Closed displaced fracture of styloid process of left ulna    Anxiety and depression 12/22/2016   Insulin resistance 10/15/2016   Presence of right artificial knee joint 10/14/2016   Vitamin D deficiency 04/08/2016   Constipation 09/20/2013   Recurrent cold sores    Osteoarthritis    Depression    Hyperlipidemia     PCP: Susy Frizzle, MD  REFERRING PROVIDER: Consuella Lose, MD  REFERRING DIAG: [phone] (ICD-10-CM) - Spinal stenosis, lumbar region with neurogenic claudication  RATIONALE FOR EVALUATION AND TREATMENT: Rehabilitation  THERAPY DIAG:  Chronic bilateral low back pain with right-sided  sciatica  Muscle spasm of back  Cramp and spasm  Muscle weakness (generalized)  Difficulty in walking, not elsewhere classified  ONSET DATE: chronic  SUBJECTIVE:                                                                                                                                                                                           SUBJECTIVE STATEMENT: Pt reports several year h/o of on and off LBP worsening recently with new sciatica w/in th past year - sciatic pain and numbness predominantly in R LE but will occasionally get a "jab" in her L buttock. Pain limits exercise tolerance for the exercises and activities she is trying to do to lose weight. Pain limits sitting, standing and walking tolerance which limits things such as shopping (has to lean on the cart). Limited with household chores requiring frequent seated rest breaks d/t LBP.  PAIN:  Are you having pain? Yes: NPRS scale: 1-2/10 Pain location: lower back Pain description: pressure Aggravating factors: prolonged standing > sitting, walking Relieving factors: change of position, muscle relaxants  PERTINENT HISTORY:  Fall with L wrist and R ankle fx 11/2019, R TKA 08/05/16, OA, osteopenia, anxiety, depression, syncope, HLD  PRECAUTIONS: None  WEIGHT BEARING RESTRICTIONS No  FALLS:  Has patient fallen in last 6 months? Yes. Number of falls 1 - syncopal episode while in [place]  LIVING ENVIRONMENT: Lives with: lives with their family and 2 sisters Lives in: House/apartment Stairs: Yes: Internal: 14 steps; on right going up and External: 1-2 steps; none Has following equipment at home: Single point cane, Grab bars, and chair lift for stairs  OCCUPATION: Retired  PLOF: Independent and Leisure: exercise - bike daily x ~30-60 min, would like to walk but limited by LBP  PATIENT GOALS: "To be able to walk for exercise w/o limitation d/t LBP."   OBJECTIVE:   DIAGNOSTIC FINDINGS:  06/22/21 - Lumbar MRI: 1.  Multilevel lumbar spondylosis as described above, worst at L4-L5 where there is severe spinal canal  and lateral recess stenosis.  2. Mild spinal canal stenosis at L2-L3 and L3-L4. Moderate right neuroforaminal stenosis at L3-L4.  PATIENT SURVEYS:  FOTO Lumbar = 41, predicted = 55  SCREENING FOR RED FLAGS: Bowel or bladder incontinence: No Spinal tumors: No Cauda equina syndrome: No Compression fracture: No Abdominal aneurysm: No  COGNITION:  Overall cognitive status: Within functional limits for tasks assessed     SENSATION: WFL Except numbness at medial knee and anterior ankle  MUSCLE LENGTH: Hamstrings: mild tight B ITB: mild/mod tight R>L Piriformis: mod tight L>R Hip flexors: mild tight R>L Quads: mild tight R>L  POSTURE: increased lumbar lordosis  PALPATION: Increased muscle tension and TTP in B lower lumbar paraspinals, SIJ, glutes and piriformis, R>L. Hypomobile spinal mobility with increased pain in lower lumbar vertebrae and sacrum.  LUMBAR ROM:   Active  A/PROM  eval  Flexion Fingertips to toes  Extension 25% limited  Right lateral flexion Hand to lower thigh - pain/tightness  Left lateral flexion Hand to lower thigh - pain/tightness  Right rotation WFL - pain  Left rotation WFL - pain   (Blank rows = not tested)  LOWER EXTREMITY ROM:       B LE AROM grossly WFL/WNL  LOWER EXTREMITY MMT:    MMT Right eval Left eval  Hip flexion 3+/5 4/5  Hip extension 3-/5 3-/5  Hip abduction 3/5 3+/5  Hip adduction 3-/5 3/5  Hip internal rotation 4/5 4+/5  Hip external rotation 4-/5 4-/5  Knee flexion 4/5 4+/5  Knee extension 4/5 4+/5  Ankle dorsiflexion 4-/5 4+/5  Ankle plantarflexion 3-/5  (limited lift with SLS heel raise) 4/5  (9 SLS heel raises)  Ankle inversion    Ankle eversion     (Blank rows = not tested)  LUMBAR SPECIAL TESTS:  Straight leg raise test: Positive on Rt    TODAY'S TREATMENT  THERAPEUTIC EXERCISE: Instruction in initial HEP to  improve flexibility, strength and mobility.  Verbal and tactile cues throughout for technique.   PATIENT EDUCATION:  Education details: PT eval findings, anticipated POC, and initial HEP Person educated: Patient Education method: Explanation, Demonstration, and Handouts Education comprehension: verbalized understanding and returned demonstration   HOME EXERCISE PROGRAM: Access Code: [email or web address]  Exercises - Static Prone on Elbows  - 2 x daily - 7 x weekly - 3 reps - 30-60 sec hold - Prone Press Up  - 2 x daily - 7 x weekly - 2 sets - 10 reps - 3-5 sec hold - Standing Lumbar Extension with Counter  - 2 x daily - 7 x weekly - 2 sets - 10 reps - 3-5 sec hold  ASSESSMENT:  CLINICAL IMPRESSION: DASHONNA CHAGNON is a 66 y.o. female who was seen today for physical therapy evaluation and treatment for lumbar spinal stenosis with neurogenic claudication. She reports long h/o intermittent chronic LBP worsening and becoming more constant over the past year with new onset of R LE radicular pain, numbness and tingling also w/in the last year. Pain limits prolonged positioning in sitting or standing, limiting tolerance for ADLs and household chores, as well as extended walking limiting shopping tolerance and preventing her from walking for exercise. Deficits include limited lumbar ROM, positive neural tension testing, decreased proximal LE flexibility with increased muscle tension in glutes/piriformis and lumbar paraspinals, and decreased LE and core strength. Chi will benefit from skilled PT to address above deficits and improve proximal LE flexibility and core/LE strength to decrease pain and LE radiculopathy.   OBJECTIVE  IMPAIRMENTS decreased activity tolerance, decreased balance, decreased endurance, decreased knowledge of condition, decreased mobility, difficulty walking, decreased ROM, decreased strength, hypomobility, increased fascial restrictions, impaired perceived functional ability, increased muscle  spasms, impaired flexibility, impaired sensation, improper body mechanics, postural dysfunction, and pain.   ACTIVITY LIMITATIONS sitting, standing, stairs, and locomotion level  PARTICIPATION LIMITATIONS: meal prep, cleaning, laundry, shopping, community activity, yard work, and exercising for weight loss  PERSONAL FACTORS Past/current experiences, Time since onset of injury/illness/exacerbation, and 3+ comorbidities: Fall with L wrist and R ankle fx 11/2019, R TKA 08/05/16, OA, osteopenia, anxiety, depression, syncope, HLD  are also affecting patient's functional outcome.   REHAB POTENTIAL: Good  CLINICAL DECISION MAKING: Evolving/moderate complexity  EVALUATION COMPLEXITY: Moderate   GOALS: Goals reviewed with patient? Yes  SHORT TERM GOALS: Target date: 09/03/2021   Patient will be independent with initial HEP.  Baseline:  Goal status: INITIAL  2.  Patient will report centralization of radicular symptoms.  Baseline:  Goal status: INITIAL   LONG TERM GOALS: Target date: 09/24/2021    Patient will be independent with advanced/ongoing HEP to improve outcomes and carryover.  Baseline:  Goal status: INITIAL  2.  Patient will report 75% improvement in low back & R LE radicular pain to improve QOL.  Baseline:  Goal status: INITIAL  3.  Patient to demonstrate ability to achieve and maintain good spinal alignment/posturing and body mechanics needed for daily activities. Baseline:  Goal status: INITIAL  4.  Patient will demonstrate full pain free lumbar ROM to perform ADLs.   Baseline:  Goal status: INITIAL  5.  Patient will demonstrate improved B LE strength to >/= 4 to 4+/5 for improved stability and ease of mobility . Baseline:  Goal status: INITIAL  6.  Patient will report 89 on lumbar FOTO to demonstrate improved functional ability.  Baseline: 41 Goal status: INITIAL   7.  Patient will tolerate >/= 30 min of standing and sitting to perform household chores with  decreased pain interference. Baseline:  Goal status: INITIAL  8.  Patient will improve walking tolerance to >/= 30 minutes w/o pain interference to allow improved tolerance for shopping or walking for exercise. Baseline:  Goal status: INITIAL   PLAN: PT FREQUENCY: 2x/week  PT DURATION: 6 weeks  PLANNED INTERVENTIONS: Therapeutic exercises, Therapeutic activity, Neuromuscular re-education, Balance training, Gait training, Patient/Family education, Joint mobilization, Stair training, Aquatic Therapy, Dry Needling, Electrical stimulation, Spinal mobilization, Cryotherapy, Moist heat, Taping, Traction, Ultrasound, Ionotophoresis '4mg'$ /ml Dexamethasone, Manual therapy, and Re-evaluation.  PLAN FOR NEXT SESSION: Review initial HEP & update to include lumbopelvic stretching and strengthening   Percival Spanish, PT 08/13/2021, 12:46 PM

## 2021-08-13 ENCOUNTER — Ambulatory Visit: Payer: Medicare Other | Attending: Neurosurgery | Admitting: Physical Therapy

## 2021-08-13 ENCOUNTER — Encounter: Payer: Self-pay | Admitting: Physical Therapy

## 2021-08-13 ENCOUNTER — Other Ambulatory Visit: Payer: Self-pay

## 2021-08-13 DIAGNOSIS — R252 Cramp and spasm: Secondary | ICD-10-CM | POA: Diagnosis present

## 2021-08-13 DIAGNOSIS — R262 Difficulty in walking, not elsewhere classified: Secondary | ICD-10-CM | POA: Diagnosis present

## 2021-08-13 DIAGNOSIS — M5441 Lumbago with sciatica, right side: Secondary | ICD-10-CM | POA: Insufficient documentation

## 2021-08-13 DIAGNOSIS — M62830 Muscle spasm of back: Secondary | ICD-10-CM | POA: Diagnosis present

## 2021-08-13 DIAGNOSIS — M6281 Muscle weakness (generalized): Secondary | ICD-10-CM | POA: Insufficient documentation

## 2021-08-13 DIAGNOSIS — G8929 Other chronic pain: Secondary | ICD-10-CM | POA: Diagnosis present

## 2021-08-16 ENCOUNTER — Ambulatory Visit: Payer: Medicare Other

## 2021-08-16 DIAGNOSIS — M62830 Muscle spasm of back: Secondary | ICD-10-CM

## 2021-08-16 DIAGNOSIS — M5441 Lumbago with sciatica, right side: Secondary | ICD-10-CM

## 2021-08-16 DIAGNOSIS — M6281 Muscle weakness (generalized): Secondary | ICD-10-CM

## 2021-08-16 DIAGNOSIS — R252 Cramp and spasm: Secondary | ICD-10-CM

## 2021-08-16 DIAGNOSIS — R262 Difficulty in walking, not elsewhere classified: Secondary | ICD-10-CM

## 2021-08-16 NOTE — Therapy (Signed)
OUTPATIENT PHYSICAL THERAPY TREATMENT   Patient Name: Monique Gonzalez MRN: [id number] DOB:1955-04-16, 66 y.o., female Today's Date: 08/16/2021   PT End of Session - 08/16/21 1117     Visit Number 2    Number of Visits 13    Date for PT Re-Evaluation 09/24/21    Authorization Type Medicare & [employer]    PT Start Time 1020    PT Stop Time 1104    PT Time Calculation (min) 44 min    Activity Tolerance Patient tolerated treatment well    Behavior During Therapy [facility] for tasks assessed/performed              Past Medical History:  Diagnosis Date   Allergy    Anxiety    Phreesia 06/04/2020   Arthritis    Phreesia 07/08/2020   Chronic pain    back and knee   Colitis 09/2012   infectious vs inflammatory.    Colon stricture [facility])    Depression    Depression    Phreesia 06/04/2020   Depression    Phreesia 07/08/2020   GERD (gastroesophageal reflux disease)    Phreesia 06/04/2020   Hyperlipidemia    Obesity    Osteoarthritis    Osteopenia    Recurrent cold sores    Seasonal allergies    Syncope and collapse 11/30/2012   Normal EEG.  Vaso vagal syncope   Past Surgical History:  Procedure Laterality Date   BIOPSY  02/21/2020   Procedure: BIOPSY;  Surgeon: Gatha Mayer, MD;  Location: [place];  Service: Endoscopy;;   COLON RESECTION N/A 10/14/2013   Procedure: LAPAROSCOPIC MOBILIZATION OF SPLENIC FLEXURE, LAPAROSCOPIC EXTENDED LEFT COLECTOMY;  Surgeon: [name], MD;  Location: [place];  Service: General;  Laterality: N/A;   COLON SURGERY N/A    Phreesia 06/04/2020   COLONOSCOPY N/A 10/12/2013   Procedure: COLONOSCOPY;  Surgeon: [name], MD;  Location: [place];  Service: Endoscopy;  Laterality: N/A;   COLONOSCOPY WITH PROPOFOL N/A 02/21/2020   Procedure: COLONOSCOPY WITH PROPOFOL;  Surgeon: Gatha Mayer, MD;  Location: [facility] ENDOSCOPY;  Service: Endoscopy;  Laterality: N/A;   DILATION AND CURETTAGE, DIAGNOSTIC / THERAPEUTIC  1987   FRACTURE  SURGERY N/A    Phreesia 06/04/2020   JOINT REPLACEMENT N/A    Phreesia 06/04/2020   OPEN REDUCTION INTERNAL FIXATION (ORIF) DISTAL RADIAL FRACTURE Right 08/08/2014   Procedure: OPEN REDUCTION INTERNAL FIXATION (ORIF) DISTAL RADIAL FRACTURE;  Surgeon: Roseanne [place], MD;  Location: [place];  Service: Orthopedics;  Laterality: Right;  DVR Crosslock, MD available sometime around 1430-1500   ORIF ANKLE FRACTURE Right 11/10/2019   Procedure: POSSIBLEOPEN REDUCTION INTERNAL FIXATION (ORIF) ANKLE FRACTURE;  Surgeon: Shona Needles, MD;  Location: [place];  Service: Orthopedics;  Laterality: Right;   ORIF WRIST FRACTURE Left 11/10/2019   Procedure: OPEN REDUCTION INTERNAL FIXATION (ORIF) WRIST FRACTURE;  Surgeon: Shona Needles, MD;  Location: [place];  Service: Orthopedics;  Laterality: Left;   TONSILLECTOMY     TOTAL KNEE ARTHROPLASTY Right 08/05/2016   TOTAL KNEE ARTHROPLASTY Right 08/05/2016   Procedure: RIGHT TOTAL KNEE ARTHROPLASTY;  Surgeon: Mcarthur Rossetti, MD;  Location: [place];  Service: Orthopedics;  Laterality: Right;   Patient Active Problem List   Diagnosis Date Noted   Ischemic stricture intestine ([place])    Colitis 02/19/2020   Closed fracture of left distal radius 11/10/2019   Fall 11/10/2019   Closed right ankle fracture 11/09/2019   Ankle fracture  11/09/2019   Closed displaced fracture of styloid process of left ulna    Anxiety and depression 12/22/2016   Insulin resistance 10/15/2016   Presence of right artificial knee joint 10/14/2016   Vitamin D deficiency 04/08/2016   Constipation 09/20/2013   Recurrent cold sores    Osteoarthritis    Depression    Hyperlipidemia     PCP: Susy Frizzle, MD  REFERRING PROVIDER: Consuella Lose, MD  REFERRING DIAG: [phone] (ICD-10-CM) - Spinal stenosis, lumbar region with neurogenic claudication  RATIONALE FOR EVALUATION AND TREATMENT: Rehabilitation  THERAPY DIAG:  Chronic bilateral low back pain with right-sided  sciatica  Muscle spasm of back  Cramp and spasm  Muscle weakness (generalized)  Difficulty in walking, not elsewhere classified  ONSET DATE: chronic  SUBJECTIVE:                                                                                                                                                                                           SUBJECTIVE STATEMENT: Pt reports doing ok today, received a back injection yesterday, does not know why her upper back is aching.  PAIN:  Are you having pain? Yes: NPRS scale: 1-2/10 Pain location: lower back Pain description: achiness Aggravating factors: prolonged standing > sitting, walking Relieving factors: change of position, muscle relaxants  PERTINENT HISTORY:  Fall with L wrist and R ankle fx 11/2019, R TKA 08/05/16, OA, osteopenia, anxiety, depression, syncope, HLD  PRECAUTIONS: None  WEIGHT BEARING RESTRICTIONS No  FALLS:  Has patient fallen in last 6 months? Yes. Number of falls 1 - syncopal episode while in [place]  LIVING ENVIRONMENT: Lives with: lives with their family and 2 sisters Lives in: House/apartment Stairs: Yes: Internal: 14 steps; on right going up and External: 1-2 steps; none Has following equipment at home: Single point cane, Grab bars, and chair lift for stairs  OCCUPATION: Retired  PLOF: Independent and Leisure: exercise - bike daily x ~30-60 min, would like to walk but limited by LBP  PATIENT GOALS: "To be able to walk for exercise w/o limitation d/t LBP."   OBJECTIVE:   DIAGNOSTIC FINDINGS:  06/22/21 - Lumbar MRI: 1. Multilevel lumbar spondylosis as described above, worst at L4-L5 where there is severe spinal canal and lateral recess stenosis.  2. Mild spinal canal stenosis at L2-L3 and L3-L4. Moderate right neuroforaminal stenosis at L3-L4.  PATIENT SURVEYS:  FOTO Lumbar = 41, predicted = 55  SCREENING FOR RED FLAGS: Bowel or bladder incontinence: No Spinal tumors: No Cauda equina syndrome:  No Compression fracture: No Abdominal aneurysm: No  COGNITION:  Overall cognitive status: Within functional limits for tasks assessed  SENSATION: WFL Except numbness at medial knee and anterior ankle  MUSCLE LENGTH: Hamstrings: mild tight B ITB: mild/mod tight R>L Piriformis: mod tight L>R Hip flexors: mild tight R>L Quads: mild tight R>L  POSTURE: increased lumbar lordosis  PALPATION: Increased muscle tension and TTP in B lower lumbar paraspinals, SIJ, glutes and piriformis, R>L. Hypomobile spinal mobility with increased pain in lower lumbar vertebrae and sacrum.  LUMBAR ROM:   Active  A/PROM  eval  Flexion Fingertips to toes  Extension 25% limited  Right lateral flexion Hand to lower thigh - pain/tightness  Left lateral flexion Hand to lower thigh - pain/tightness  Right rotation WFL - pain  Left rotation WFL - pain   (Blank rows = not tested)  LOWER EXTREMITY ROM:       B LE AROM grossly WFL/WNL  LOWER EXTREMITY MMT:    MMT Right eval Left eval  Hip flexion 3+/5 4/5  Hip extension 3-/5 3-/5  Hip abduction 3/5 3+/5  Hip adduction 3-/5 3/5  Hip internal rotation 4/5 4+/5  Hip external rotation 4-/5 4-/5  Knee flexion 4/5 4+/5  Knee extension 4/5 4+/5  Ankle dorsiflexion 4-/5 4+/5  Ankle plantarflexion 3-/5  (limited lift with SLS heel raise) 4/5  (9 SLS heel raises)  Ankle inversion    Ankle eversion     (Blank rows = not tested)  LUMBAR SPECIAL TESTS:  Straight leg raise test: Positive on Rt    TODAY'S TREATMENT  08/16/21 Therapeutic Exercise: Bike L2x10mn Standing extension over counter 10x3" Seated pball rollout into flexion 2x30" Supine R KTOS piriformis stretch x 30 sec Supine R figure 4 stretch x 30 sec Supine ITB stretch with strap x 30 bil Supine PPT 10x3" Supine PPT with march 10x3" - cues to keep back flat and core engaged Supine PPT with clam - cues to keep back flat and core engaged  THERAPEUTIC EXERCISE: Instruction in  initial HEP to improve flexibility, strength and mobility.  Verbal and tactile cues throughout for technique.   PATIENT EDUCATION:  Education details: HEP update (seated flexion stretch, pelvic tilt, PT with march) Person educated: Patient Education method: EConsulting civil engineer Demonstration, and Handouts Education comprehension: verbalized understanding and returned demonstration   HOME EXERCISE PROGRAM: Access Code: [email or web address]    ASSESSMENT:  CLINICAL IMPRESSION: Pt was w/o any complaints or increase in pain throughout session today. She required some instruction to facilitate TrA contraction and keep it during marches and clams. Session focused on lumbopelvic stretching to reduce pulling on joints and also facilitating core stabilizing muscles to reduce pain with functional activities. Pt showed good response.  OBJECTIVE IMPAIRMENTS decreased activity tolerance, decreased balance, decreased endurance, decreased knowledge of condition, decreased mobility, difficulty walking, decreased ROM, decreased strength, hypomobility, increased fascial restrictions, impaired perceived functional ability, increased muscle spasms, impaired flexibility, impaired sensation, improper body mechanics, postural dysfunction, and pain.   ACTIVITY LIMITATIONS sitting, standing, stairs, and locomotion level  PARTICIPATION LIMITATIONS: meal prep, cleaning, laundry, shopping, community activity, yard work, and exercising for weight loss  PERSONAL FACTORS Past/current experiences, Time since onset of injury/illness/exacerbation, and 3+ comorbidities: Fall with L wrist and R ankle fx 11/2019, R TKA 08/05/16, OA, osteopenia, anxiety, depression, syncope, HLD  are also affecting patient's functional outcome.   REHAB POTENTIAL: Good  CLINICAL DECISION MAKING: Evolving/moderate complexity  EVALUATION COMPLEXITY: Moderate   GOALS: Goals reviewed with patient? Yes  SHORT TERM GOALS: Target date: 09/03/2021   Patient will  be independent with initial HEP.  Baseline:  Goal status: INITIAL  2.  Patient will report centralization of radicular symptoms.  Baseline:  Goal status: INITIAL   LONG TERM GOALS: Target date: 09/24/2021    Patient will be independent with advanced/ongoing HEP to improve outcomes and carryover.  Baseline:  Goal status: INITIAL  2.  Patient will report 75% improvement in low back & R LE radicular pain to improve QOL.  Baseline:  Goal status: INITIAL  3.  Patient to demonstrate ability to achieve and maintain good spinal alignment/posturing and body mechanics needed for daily activities. Baseline:  Goal status: INITIAL  4.  Patient will demonstrate full pain free lumbar ROM to perform ADLs.   Baseline:  Goal status: INITIAL  5.  Patient will demonstrate improved B LE strength to >/= 4 to 4+/5 for improved stability and ease of mobility . Baseline:  Goal status: INITIAL  6.  Patient will report 40 on lumbar FOTO to demonstrate improved functional ability.  Baseline: 41 Goal status: INITIAL   7.  Patient will tolerate >/= 30 min of standing and sitting to perform household chores with decreased pain interference. Baseline:  Goal status: INITIAL  8.  Patient will improve walking tolerance to >/= 30 minutes w/o pain interference to allow improved tolerance for shopping or walking for exercise. Baseline:  Goal status: INITIAL   PLAN: PT FREQUENCY: 2x/week  PT DURATION: 6 weeks  PLANNED INTERVENTIONS: Therapeutic exercises, Therapeutic activity, Neuromuscular re-education, Balance training, Gait training, Patient/Family education, Joint mobilization, Stair training, Aquatic Therapy, Dry Needling, Electrical stimulation, Spinal mobilization, Cryotherapy, Moist heat, Taping, Traction, Ultrasound, Ionotophoresis '4mg'$ /ml Dexamethasone, Manual therapy, and Re-evaluation.  PLAN FOR NEXT SESSION: Review initial HEP & update to include lumbopelvic stretching and  strengthening   Eldridge Marcott Renaldo Harrison, PTA 08/16/2021, 11:18 AM

## 2021-08-20 ENCOUNTER — Ambulatory Visit: Payer: Medicare Other | Admitting: Physical Therapy

## 2021-08-20 ENCOUNTER — Encounter: Payer: Self-pay | Admitting: Physical Therapy

## 2021-08-20 DIAGNOSIS — R262 Difficulty in walking, not elsewhere classified: Secondary | ICD-10-CM

## 2021-08-20 DIAGNOSIS — G8929 Other chronic pain: Secondary | ICD-10-CM

## 2021-08-20 DIAGNOSIS — M6281 Muscle weakness (generalized): Secondary | ICD-10-CM

## 2021-08-20 DIAGNOSIS — R252 Cramp and spasm: Secondary | ICD-10-CM

## 2021-08-20 DIAGNOSIS — M62830 Muscle spasm of back: Secondary | ICD-10-CM

## 2021-08-20 DIAGNOSIS — M5441 Lumbago with sciatica, right side: Secondary | ICD-10-CM | POA: Diagnosis not present

## 2021-08-20 NOTE — Therapy (Signed)
OUTPATIENT PHYSICAL THERAPY TREATMENT   Patient Name: Monique Gonzalez MRN: [id number] DOB:Jun 27, 1955, 66 y.o., female Today's Date: 08/20/2021   PT End of Session - 08/20/21 1018     Visit Number 3    Number of Visits 13    Date for PT Re-Evaluation 09/24/21    Authorization Type Medicare & [employer]    PT Start Time 1018    PT Stop Time 1102    PT Time Calculation (min) 44 min    Activity Tolerance Patient tolerated treatment well    Behavior During Therapy [facility] for tasks assessed/performed               Past Medical History:  Diagnosis Date   Allergy    Anxiety    Phreesia 06/04/2020   Arthritis    Phreesia 07/08/2020   Chronic pain    back and knee   Colitis 09/2012   infectious vs inflammatory.    Colon stricture [facility])    Depression    Depression    Phreesia 06/04/2020   Depression    Phreesia 07/08/2020   GERD (gastroesophageal reflux disease)    Phreesia 06/04/2020   Hyperlipidemia    Obesity    Osteoarthritis    Osteopenia    Recurrent cold sores    Seasonal allergies    Syncope and collapse 11/30/2012   Normal EEG.  Vaso vagal syncope   Past Surgical History:  Procedure Laterality Date   BIOPSY  02/21/2020   Procedure: BIOPSY;  Surgeon: Gatha Mayer, MD;  Location: [place];  Service: Endoscopy;;   COLON RESECTION N/A 10/14/2013   Procedure: LAPAROSCOPIC MOBILIZATION OF SPLENIC FLEXURE, LAPAROSCOPIC EXTENDED LEFT COLECTOMY;  Surgeon: [name], MD;  Location: [place];  Service: General;  Laterality: N/A;   COLON SURGERY N/A    Phreesia 06/04/2020   COLONOSCOPY N/A 10/12/2013   Procedure: COLONOSCOPY;  Surgeon: [name], MD;  Location: [place];  Service: Endoscopy;  Laterality: N/A;   COLONOSCOPY WITH PROPOFOL N/A 02/21/2020   Procedure: COLONOSCOPY WITH PROPOFOL;  Surgeon: Gatha Mayer, MD;  Location: [facility] ENDOSCOPY;  Service: Endoscopy;  Laterality: N/A;   DILATION AND CURETTAGE, DIAGNOSTIC / THERAPEUTIC  1987   FRACTURE  SURGERY N/A    Phreesia 06/04/2020   JOINT REPLACEMENT N/A    Phreesia 06/04/2020   OPEN REDUCTION INTERNAL FIXATION (ORIF) DISTAL RADIAL FRACTURE Right 08/08/2014   Procedure: OPEN REDUCTION INTERNAL FIXATION (ORIF) DISTAL RADIAL FRACTURE;  Surgeon: Roseanne Kaufman, MD;  Location: [place];  Service: Orthopedics;  Laterality: Right;  DVR Crosslock, MD available sometime around 1430-1500   ORIF ANKLE FRACTURE Right 11/10/2019   Procedure: POSSIBLEOPEN REDUCTION INTERNAL FIXATION (ORIF) ANKLE FRACTURE;  Surgeon: Shona Needles, MD;  Location: [place];  Service: Orthopedics;  Laterality: Right;   ORIF WRIST FRACTURE Left 11/10/2019   Procedure: OPEN REDUCTION INTERNAL FIXATION (ORIF) WRIST FRACTURE;  Surgeon: Shona Needles, MD;  Location: [place];  Service: Orthopedics;  Laterality: Left;   TONSILLECTOMY     TOTAL KNEE ARTHROPLASTY Right 08/05/2016   TOTAL KNEE ARTHROPLASTY Right 08/05/2016   Procedure: RIGHT TOTAL KNEE ARTHROPLASTY;  Surgeon: Mcarthur Rossetti, MD;  Location: [place];  Service: Orthopedics;  Laterality: Right;   Patient Active Problem List   Diagnosis Date Noted   Ischemic stricture intestine ([place])    Colitis 02/19/2020   Closed fracture of left distal radius 11/10/2019   Fall 11/10/2019   Closed right ankle fracture 11/09/2019   Ankle  fracture 11/09/2019   Closed displaced fracture of styloid process of left ulna    Anxiety and depression 12/22/2016   Insulin resistance 10/15/2016   Presence of right artificial knee joint 10/14/2016   Vitamin D deficiency 04/08/2016   Constipation 09/20/2013   Recurrent cold sores    Osteoarthritis    Depression    Hyperlipidemia     PCP: Susy Frizzle, MD  REFERRING PROVIDER: Consuella Lose, MD  REFERRING DIAG: [phone] (ICD-10-CM) - Spinal stenosis, lumbar region with neurogenic claudication  RATIONALE FOR EVALUATION AND TREATMENT: Rehabilitation  THERAPY DIAG:  Chronic bilateral low back pain with right-sided  sciatica  Muscle spasm of back  Cramp and spasm  Muscle weakness (generalized)  Difficulty in walking, not elsewhere classified  ONSET DATE: chronic  SUBJECTIVE:                                                                                                                                                                                           SUBJECTIVE STATEMENT: Pt denies pain, only noting a slight achiness after working on some home renovation projects.  PAIN:  Are you having pain? No: Pain location: lower back Pain description: slight achiness  PERTINENT HISTORY:  Fall with L wrist and R ankle fx 11/2019, R TKA 08/05/16, OA, osteopenia, anxiety, depression, syncope, HLD  PRECAUTIONS: None  WEIGHT BEARING RESTRICTIONS No  FALLS:  Has patient fallen in last 6 months? Yes. Number of falls 1 - syncopal episode while in [place]  LIVING ENVIRONMENT: Lives with: lives with their family and 2 sisters Lives in: House/apartment Stairs: Yes: Internal: 14 steps; on right going up and External: 1-2 steps; none Has following equipment at home: Single point cane, Grab bars, and chair lift for stairs  OCCUPATION: Retired  PLOF: Independent and Leisure: exercise - bike daily x ~30-60 min, would like to walk but limited by LBP  PATIENT GOALS: "To be able to walk for exercise w/o limitation d/t LBP."   OBJECTIVE:   DIAGNOSTIC FINDINGS:  06/22/21 - Lumbar MRI: 1. Multilevel lumbar spondylosis as described above, worst at L4-L5 where there is severe spinal canal and lateral recess stenosis.  2. Mild spinal canal stenosis at L2-L3 and L3-L4. Moderate right neuroforaminal stenosis at L3-L4.  PATIENT SURVEYS:  FOTO Lumbar = 41, predicted = 55  SCREENING FOR RED FLAGS: Bowel or bladder incontinence: No Spinal tumors: No Cauda equina syndrome: No Compression fracture: No Abdominal aneurysm: No  COGNITION:  Overall cognitive status: Within functional limits for tasks  assessed     SENSATION: WFL Except numbness at medial knee and anterior ankle  MUSCLE LENGTH: Hamstrings: mild  tight B ITB: mild/mod tight R>L Piriformis: mod tight L>R Hip flexors: mild tight R>L Quads: mild tight R>L  POSTURE: increased lumbar lordosis  PALPATION: Increased muscle tension and TTP in B lower lumbar paraspinals, SIJ, glutes and piriformis, R>L. Hypomobile spinal mobility with increased pain in lower lumbar vertebrae and sacrum.  LUMBAR ROM:   Active  A/PROM  eval  Flexion Fingertips to toes  Extension 25% limited  Right lateral flexion Hand to lower thigh - pain/tightness  Left lateral flexion Hand to lower thigh - pain/tightness  Right rotation WFL - pain  Left rotation WFL - pain   (Blank rows = not tested)  LOWER EXTREMITY ROM:       B LE AROM grossly WFL/WNL  LOWER EXTREMITY MMT:    MMT Right eval Left eval  Hip flexion 3+/5 4/5  Hip extension 3-/5 3-/5  Hip abduction 3/5 3+/5  Hip adduction 3-/5 3/5  Hip internal rotation 4/5 4+/5  Hip external rotation 4-/5 4-/5  Knee flexion 4/5 4+/5  Knee extension 4/5 4+/5  Ankle dorsiflexion 4-/5 4+/5  Ankle plantarflexion 3-/5  (limited lift with SLS heel raise) 4/5  (9 SLS heel raises)  Ankle inversion    Ankle eversion     (Blank rows = not tested)  LUMBAR SPECIAL TESTS:  Straight leg raise test: Positive on Rt    TODAY'S TREATMENT   08/20/21 THERAPEUTIC EXERCISE: to improve flexibility, strength and mobility.  Verbal and tactile cues throughout for technique. NuStep L4 x 6 min Hooklying PPT + hip ADD ball squeeze 10 x 5" Hooklying PPT + RTB march 10 x 3" Hooklying PPT + RTB alt LE hip ABD/ER bent knee fallout 10 x 3" Bridge + red TB hip ABD isometric 10 x 3" - cues to avoid pressing into heel on lift to reduce risk for HS cramping Bridge + hip ADD ball squeeze isometric 10 x 3" Hooklying R KTOS piriformis stretch x 30 sec Hooklying R figure 4 stretch x 30 sec Hooklying ITB stretch  with strap x 30 bil Seated 3-way lumbar flexion stretch with UE support on cane x 30 sec each direction   08/16/21 Therapeutic Exercise: Bike [id number] Standing extension over counter 10x3" Seated pball rollout into flexion 2x30" Supine R KTOS piriformis stretch x 30 sec Supine R figure 4 stretch x 30 sec Supine ITB stretch with strap x 30 bil Supine PPT 10x3" Supine PPT with march 10x3" - cues to keep back flat and core engaged Supine PPT with clam - cues to keep back flat and core engaged   08/13/21 THERAPEUTIC EXERCISE: Instruction in initial HEP to improve flexibility, strength and mobility.  Verbal and tactile cues throughout for technique.   PATIENT EDUCATION:  Education details: HEP update (seated flexion stretch, pelvic tilt, PT with march) Person educated: Patient Education method: EConsulting civil engineer Demonstration, and Handouts Education comprehension: verbalized understanding and returned demonstration   HOME EXERCISE PROGRAM: Access Code: [email or web address]  Exercises - Static Prone on Elbows  - 2 x daily - 7 x weekly - 3 reps - 30-60 sec hold - Prone Press Up  - 2 x daily - 7 x weekly - 2 sets - 10 reps - 3-5 sec hold - Standing Lumbar Extension with Counter  - 2 x daily - 7 x weekly - 2 sets - 10 reps - 3-5 sec hold - Seated Flexion Stretch with Swiss Ball  - 2 x daily - 7 x weekly - 2 sets - 2 reps - 30 sec hold -  Supine Posterior Pelvic Tilt  - 1 x daily - 7 x weekly - 2-3 sets - 10 reps - 3 sec hold - Supine Figure 4 Piriformis Stretch  - 2 x daily - 7 x weekly - 3 reps - 30 sec hold - Supine Piriformis Stretch with Foot on Ground  - 2 x daily - 7 x weekly - 3 reps - 30 sec hold - Supine ITB Stretch with Strap  - 2 x daily - 7 x weekly - 3 reps - 30 sec hold - Supine March with Resistance Band  - 1 x daily - 7 x weekly - 2 sets - 10 reps - 2-3 sec hold hold - Hooklying Single Leg Bent Knee Fallouts with Resistance  - 1 x daily - 7 x weekly - 2 sets - 10 reps - 3 sec hold - Supine  Bridge with Mini Swiss Ball Between Knees  - 1 x daily - 7 x weekly - 2 sets - 10 reps - 5 sec hold    ASSESSMENT:  CLINICAL IMPRESSION: Noella denies pain today, reporting only slight achiness related to increased activity working on home renovations. She states she has not had any radicular symptoms since the ESI - STG #2 met. Progressed hooklying PPT/core strengthening with addition of ball squeezes and RTB resisted hip strengthening - all exercises well tolerated. HEP update to reflect progression and provide clarification on the stretches as she feels that the stretching really seems to be helping.  OBJECTIVE IMPAIRMENTS decreased activity tolerance, decreased balance, decreased endurance, decreased knowledge of condition, decreased mobility, difficulty walking, decreased ROM, decreased strength, hypomobility, increased fascial restrictions, impaired perceived functional ability, increased muscle spasms, impaired flexibility, impaired sensation, improper body mechanics, postural dysfunction, and pain.   ACTIVITY LIMITATIONS sitting, standing, stairs, and locomotion level  PARTICIPATION LIMITATIONS: meal prep, cleaning, laundry, shopping, community activity, yard work, and exercising for weight loss  PERSONAL FACTORS Past/current experiences, Time since onset of injury/illness/exacerbation, and 3+ comorbidities: Fall with L wrist and R ankle fx 11/2019, R TKA 08/05/16, OA, osteopenia, anxiety, depression, syncope, HLD  are also affecting patient's functional outcome.   REHAB POTENTIAL: Good  CLINICAL DECISION MAKING: Evolving/moderate complexity  EVALUATION COMPLEXITY: Moderate   GOALS: Goals reviewed with patient? Yes  SHORT TERM GOALS: Target date: 09/03/2021   Patient will be independent with initial HEP.  Baseline:  Goal status: IN PROGRESS  2.  Patient will report centralization of radicular symptoms.  Baseline:  Goal status: MET  08/20/21 - Pt reports no radicular symptoms since  the ESI   LONG TERM GOALS: Target date: 09/24/2021    Patient will be independent with advanced/ongoing HEP to improve outcomes and carryover.  Baseline:  Goal status: IN PROGRESS  2.  Patient will report 75% improvement in low back & R LE radicular pain to improve QOL.  Baseline:  Goal status: IN PROGRESS  3.  Patient to demonstrate ability to achieve and maintain good spinal alignment/posturing and body mechanics needed for daily activities. Baseline:  Goal status: IN PROGRESS  4.  Patient will demonstrate full pain free lumbar ROM to perform ADLs.   Baseline:  Goal status: IN PROGRESS  5.  Patient will demonstrate improved B LE strength to >/= 4 to 4+/5 for improved stability and ease of mobility . Baseline:  Goal status: IN PROGRESS  6.  Patient will report 84 on lumbar FOTO to demonstrate improved functional ability.  Baseline: 41 Goal status: IN PROGRESS   7.  Patient will tolerate >/=  30 min of standing and sitting to perform household chores with decreased pain interference. Baseline:  Goal status: IN PROGRESS  8.  Patient will improve walking tolerance to >/= 30 minutes w/o pain interference to allow improved tolerance for shopping or walking for exercise. Baseline:  Goal status: IN PROGRESS   PLAN: PT FREQUENCY: 2x/week  PT DURATION: 6 weeks  PLANNED INTERVENTIONS: Therapeutic exercises, Therapeutic activity, Neuromuscular re-education, Balance training, Gait training, Patient/Family education, Joint mobilization, Stair training, Aquatic Therapy, Dry Needling, Electrical stimulation, Spinal mobilization, Cryotherapy, Moist heat, Taping, Traction, Ultrasound, Ionotophoresis 76m/ml Dexamethasone, Manual therapy, and Re-evaluation.  PLAN FOR NEXT SESSION: progress lumbopelvic stretching and strengthening; review update HEP as indicated   JPercival Spanish PT 08/20/2021, 12:17 PM

## 2021-08-23 ENCOUNTER — Ambulatory Visit: Payer: Medicare Other | Admitting: Physical Therapy

## 2021-08-23 ENCOUNTER — Encounter: Payer: Self-pay | Admitting: Physical Therapy

## 2021-08-23 DIAGNOSIS — G8929 Other chronic pain: Secondary | ICD-10-CM

## 2021-08-23 DIAGNOSIS — M5441 Lumbago with sciatica, right side: Secondary | ICD-10-CM | POA: Diagnosis not present

## 2021-08-23 DIAGNOSIS — R252 Cramp and spasm: Secondary | ICD-10-CM

## 2021-08-23 DIAGNOSIS — M62830 Muscle spasm of back: Secondary | ICD-10-CM

## 2021-08-23 DIAGNOSIS — M6281 Muscle weakness (generalized): Secondary | ICD-10-CM

## 2021-08-23 DIAGNOSIS — R262 Difficulty in walking, not elsewhere classified: Secondary | ICD-10-CM

## 2021-08-23 NOTE — Therapy (Signed)
OUTPATIENT PHYSICAL THERAPY TREATMENT   Patient Name: Monique Gonzalez MRN: [id number] DOB:1955-08-23, 66 y.o., female Today's Date: 08/23/2021   PT End of Session - 08/23/21 1015     Visit Number 4    Number of Visits 13    Date for PT Re-Evaluation 09/24/21    Authorization Type Medicare & [employer]    PT Start Time 1015    PT Stop Time 1100    PT Time Calculation (min) 45 min    Activity Tolerance Patient tolerated treatment well    Behavior During Therapy [facility] for tasks assessed/performed                Past Medical History:  Diagnosis Date   Allergy    Anxiety    Phreesia 06/04/2020   Arthritis    Phreesia 07/08/2020   Chronic pain    back and knee   Colitis 09/2012   infectious vs inflammatory.    Colon stricture [facility])    Depression    Depression    Phreesia 06/04/2020   Depression    Phreesia 07/08/2020   GERD (gastroesophageal reflux disease)    Phreesia 06/04/2020   Hyperlipidemia    Obesity    Osteoarthritis    Osteopenia    Recurrent cold sores    Seasonal allergies    Syncope and collapse 11/30/2012   Normal EEG.  Vaso vagal syncope   Past Surgical History:  Procedure Laterality Date   BIOPSY  02/21/2020   Procedure: BIOPSY;  Surgeon: Gatha Mayer, MD;  Location: [place];  Service: Endoscopy;;   COLON RESECTION N/A 10/14/2013   Procedure: LAPAROSCOPIC MOBILIZATION OF SPLENIC FLEXURE, LAPAROSCOPIC EXTENDED LEFT COLECTOMY;  Surgeon: [name], MD;  Location: [place];  Service: General;  Laterality: N/A;   COLON SURGERY N/A    Phreesia 06/04/2020   COLONOSCOPY N/A 10/12/2013   Procedure: COLONOSCOPY;  Surgeon: [name], MD;  Location: [place];  Service: Endoscopy;  Laterality: N/A;   COLONOSCOPY WITH PROPOFOL N/A 02/21/2020   Procedure: COLONOSCOPY WITH PROPOFOL;  Surgeon: Gatha Mayer, MD;  Location: [facility] ENDOSCOPY;  Service: Endoscopy;  Laterality: N/A;   DILATION AND CURETTAGE, DIAGNOSTIC / THERAPEUTIC  1987   FRACTURE  SURGERY N/A    Phreesia 06/04/2020   JOINT REPLACEMENT N/A    Phreesia 06/04/2020   OPEN REDUCTION INTERNAL FIXATION (ORIF) DISTAL RADIAL FRACTURE Right 08/08/2014   Procedure: OPEN REDUCTION INTERNAL FIXATION (ORIF) DISTAL RADIAL FRACTURE;  Surgeon: Roseanne Kaufman, MD;  Location: [place];  Service: Orthopedics;  Laterality: Right;  DVR Crosslock, MD available sometime around 1430-1500   ORIF ANKLE FRACTURE Right 11/10/2019   Procedure: POSSIBLEOPEN REDUCTION INTERNAL FIXATION (ORIF) ANKLE FRACTURE;  Surgeon: Shona Needles, MD;  Location: [place];  Service: Orthopedics;  Laterality: Right;   ORIF WRIST FRACTURE Left 11/10/2019   Procedure: OPEN REDUCTION INTERNAL FIXATION (ORIF) WRIST FRACTURE;  Surgeon: Shona Needles, MD;  Location: [place];  Service: Orthopedics;  Laterality: Left;   TONSILLECTOMY     TOTAL KNEE ARTHROPLASTY Right 08/05/2016   TOTAL KNEE ARTHROPLASTY Right 08/05/2016   Procedure: RIGHT TOTAL KNEE ARTHROPLASTY;  Surgeon: Mcarthur Rossetti, MD;  Location: [place];  Service: Orthopedics;  Laterality: Right;   Patient Active Problem List   Diagnosis Date Noted   Ischemic stricture intestine ([place])    Colitis 02/19/2020   Closed fracture of left distal radius 11/10/2019   Fall 11/10/2019   Closed right ankle fracture 11/09/2019  Ankle fracture 11/09/2019   Closed displaced fracture of styloid process of left ulna    Anxiety and depression 12/22/2016   Insulin resistance 10/15/2016   Presence of right artificial knee joint 10/14/2016   Vitamin D deficiency 04/08/2016   Constipation 09/20/2013   Recurrent cold sores    Osteoarthritis    Depression    Hyperlipidemia     PCP: Susy Frizzle, MD  REFERRING PROVIDER: Consuella Lose, MD  REFERRING DIAG: [phone] (ICD-10-CM) - Spinal stenosis, lumbar region with neurogenic claudication  RATIONALE FOR EVALUATION AND TREATMENT: Rehabilitation  THERAPY DIAG:  Chronic bilateral low back pain with right-sided  sciatica  Muscle spasm of back  Cramp and spasm  Muscle weakness (generalized)  Difficulty in walking, not elsewhere classified  ONSET DATE: chronic  SUBJECTIVE:                                                                                                                                                                                           SUBJECTIVE STATEMENT: Pt report the stretching really seems to be helping with better tolerance for projects around the house. She does note her numbness in the front of her R ankle is coming back but no pain.  PAIN:  Are you having pain? No  PERTINENT HISTORY:  Fall with L wrist and R ankle fx 11/2019, R TKA 08/05/16, OA, osteopenia, anxiety, depression, syncope, HLD  PRECAUTIONS: None  WEIGHT BEARING RESTRICTIONS No  FALLS:  Has patient fallen in last 6 months? Yes. Number of falls 1 - syncopal episode while in [place]  LIVING ENVIRONMENT: Lives with: lives with their family and 2 sisters Lives in: House/apartment Stairs: Yes: Internal: 14 steps; on right going up and External: 1-2 steps; none Has following equipment at home: Single point cane, Grab bars, and chair lift for stairs  OCCUPATION: Retired  PLOF: Independent and Leisure: exercise - bike daily x ~30-60 min, would like to walk but limited by LBP  PATIENT GOALS: "To be able to walk for exercise w/o limitation d/t LBP."   OBJECTIVE:   DIAGNOSTIC FINDINGS:  06/22/21 - Lumbar MRI: 1. Multilevel lumbar spondylosis as described above, worst at L4-L5 where there is severe spinal canal and lateral recess stenosis.  2. Mild spinal canal stenosis at L2-L3 and L3-L4. Moderate right neuroforaminal stenosis at L3-L4.  PATIENT SURVEYS:  FOTO Lumbar = 41, predicted = 55  SCREENING FOR RED FLAGS: Bowel or bladder incontinence: No Spinal tumors: No Cauda equina syndrome: No Compression fracture: No Abdominal aneurysm: No  COGNITION:  Overall cognitive status: Within functional  limits for tasks assessed     SENSATION: [facility]  Except numbness at medial knee and anterior ankle  MUSCLE LENGTH: Hamstrings: mild tight B ITB: mild/mod tight R>L Piriformis: mod tight L>R Hip flexors: mild tight R>L Quads: mild tight R>L  POSTURE: increased lumbar lordosis  PALPATION: Increased muscle tension and TTP in B lower lumbar paraspinals, SIJ, glutes and piriformis, R>L. Hypomobile spinal mobility with increased pain in lower lumbar vertebrae and sacrum.  LUMBAR ROM:   Active  A/PROM  08/13/21  Flexion Fingertips to toes  Extension 25% limited  Right lateral flexion Hand to lower thigh - pain/tightness  Left lateral flexion Hand to lower thigh - pain/tightness  Right rotation [facility] - pain  Left rotation WFL - pain   (Blank rows = not tested)  LOWER EXTREMITY ROM:       B LE AROM grossly WFL/WNL  LOWER EXTREMITY MMT:    MMT Right 08/13/21 Left 08/13/21  Hip flexion 3+/5 4/5  Hip extension 3-/5 3-/5  Hip abduction 3/5 3+/5  Hip adduction 3-/5 3/5  Hip internal rotation 4/5 4+/5  Hip external rotation 4-/5 4-/5  Knee flexion 4/5 4+/5  Knee extension 4/5 4+/5  Ankle dorsiflexion 4-/5 4+/5  Ankle plantarflexion 3-/5  (limited lift with SLS heel raise) 4/5  (9 SLS heel raises)  Ankle inversion    Ankle eversion     (Blank rows = not tested)  LUMBAR SPECIAL TESTS:  Straight leg raise test: Positive on Rt    TODAY'S TREATMENT   08/23/21 THERAPEUTIC EXERCISE: to improve flexibility, strength and mobility.  Verbal and tactile cues throughout for technique. NuStep L5 x 6 min B heel raises with counter support 2 x 10 Counter squat 2 x 10 R/L standing 3-way hip SLR (flexion, abduction, extension) x 10 each B GTB rows 10 x 3" - cues for abdominal bracing B GTB scap retraction/shoulder ext + TrA 10 x 3" R/L GTB pallof press 10 x 3" Seated 3-way lumbar flexion stretch with UE support on cane x 30 sec each direction Seated TrA + alt LAQ x 10 Seated TrA + R/L GTB  HS curls x 10 Hooklying PPT + RTB alt LE hip ABD/ER bent knee fallout 10 x 3" Bridge + red TB hip ABD isometric 10 x 3"    08/20/21 THERAPEUTIC EXERCISE: to improve flexibility, strength and mobility.  Verbal and tactile cues throughout for technique. NuStep L4 x 6 min Hooklying PPT + hip ADD ball squeeze 10 x 5" Hooklying PPT + RTB march 10 x 3" Hooklying PPT + RTB alt LE hip ABD/ER bent knee fallout 10 x 3" Bridge + red TB hip ABD isometric 10 x 3" - cues to avoid pressing into heel on lift to reduce risk for HS cramping Bridge + hip ADD ball squeeze isometric 10 x 3" Hooklying R KTOS piriformis stretch x 30 sec Hooklying R figure 4 stretch x 30 sec Hooklying ITB stretch with strap x 30 bil Seated 3-way lumbar flexion stretch with UE support on cane x 30 sec each direction   08/16/21 Therapeutic Exercise: Bike L2x48mn Standing extension over counter 10x3" Seated pball rollout into flexion 2x30" Supine R KTOS piriformis stretch x 30 sec Supine R figure 4 stretch x 30 sec Supine ITB stretch with strap x 30 bil Supine PPT 10x3" Supine PPT with march 10x3" - cues to keep back flat and core engaged Supine PPT with clam - cues to keep back flat and core engaged   08/13/21 THERAPEUTIC EXERCISE: Instruction in initial HEP to improve flexibility, strength and mobility.  Verbal and tactile cues throughout for technique.   PATIENT EDUCATION:  Education details: HEP update (seated flexion stretch, pelvic tilt, PT with march) Person educated: Patient Education method: Consulting civil engineer, Demonstration, and Handouts Education comprehension: verbalized understanding and returned demonstration   HOME EXERCISE PROGRAM: Access Code: [email or web address]  Exercises - Static Prone on Elbows  - 2 x daily - 7 x weekly - 3 reps - 30-60 sec hold - Prone Press Up  - 2 x daily - 7 x weekly - 2 sets - 10 reps - 3-5 sec hold - Standing Lumbar Extension with Counter  - 2 x daily - 7 x weekly - 2 sets - 10 reps - 3-5  sec hold - Seated Flexion Stretch with Swiss Ball  - 2 x daily - 7 x weekly - 2 sets - 2 reps - 30 sec hold - Supine Posterior Pelvic Tilt  - 1 x daily - 7 x weekly - 2-3 sets - 10 reps - 3 sec hold - Supine Figure 4 Piriformis Stretch  - 2 x daily - 7 x weekly - 3 reps - 30 sec hold - Supine Piriformis Stretch with Foot on Ground  - 2 x daily - 7 x weekly - 3 reps - 30 sec hold - Supine ITB Stretch with Strap  - 2 x daily - 7 x weekly - 3 reps - 30 sec hold - Supine March with Resistance Band  - 1 x daily - 7 x weekly - 2 sets - 10 reps - 2-3 sec hold hold - Hooklying Single Leg Bent Knee Fallouts with Resistance  - 1 x daily - 7 x weekly - 2 sets - 10 reps - 3 sec hold - Supine Bridge with Mini Swiss Ball Between Knees  - 1 x daily - 7 x weekly - 2 sets - 10 reps - 5 sec hold    ASSESSMENT:  CLINICAL IMPRESSION: Monique Gonzalez reports the HEP seems to be helping with improving tolerance noted with projects around her home after she has completed the HEP- STG #1 met. Progressed therapeutic exercises to include standing core and LE strengthening while continuing to encourage core activation. Good tolerance for all exercises but pt noting some trouble remembering not to hold her breath and to keep her core engaged.    OBJECTIVE IMPAIRMENTS decreased activity tolerance, decreased balance, decreased endurance, decreased knowledge of condition, decreased mobility, difficulty walking, decreased ROM, decreased strength, hypomobility, increased fascial restrictions, impaired perceived functional ability, increased muscle spasms, impaired flexibility, impaired sensation, improper body mechanics, postural dysfunction, and pain.   ACTIVITY LIMITATIONS sitting, standing, stairs, and locomotion level  PARTICIPATION LIMITATIONS: meal prep, cleaning, laundry, shopping, community activity, yard work, and exercising for weight loss  PERSONAL FACTORS Past/current experiences, Time since onset of  injury/illness/exacerbation, and 3+ comorbidities: Fall with L wrist and R ankle fx 11/2019, R TKA 08/05/16, OA, osteopenia, anxiety, depression, syncope, HLD  are also affecting patient's functional outcome.   REHAB POTENTIAL: Good  CLINICAL DECISION MAKING: Evolving/moderate complexity  EVALUATION COMPLEXITY: Moderate   GOALS: Goals reviewed with patient? Yes  SHORT TERM GOALS: Target date: 09/03/2021   Patient will be independent with initial HEP.  Baseline:  Goal status: MET  08/23/21  2.  Patient will report centralization of radicular symptoms.  Baseline:  Goal status: MET  08/20/21 - Pt reports no radicular symptoms since the ESI   LONG TERM GOALS: Target date: 09/24/2021    Patient will be independent with advanced/ongoing HEP to improve outcomes and carryover.  Baseline:  Goal status: IN PROGRESS  2.  Patient will report 75% improvement in low back & R LE radicular pain to improve QOL.  Baseline:  Goal status: IN PROGRESS  3.  Patient to demonstrate ability to achieve and maintain good spinal alignment/posturing and body mechanics needed for daily activities. Baseline:  Goal status: IN PROGRESS  4.  Patient will demonstrate full pain free lumbar ROM to perform ADLs.   Baseline:  Goal status: IN PROGRESS  5.  Patient will demonstrate improved B LE strength to >/= 4 to 4+/5 for improved stability and ease of mobility . Baseline:  Goal status: IN PROGRESS  6.  Patient will report 45 on lumbar FOTO to demonstrate improved functional ability.  Baseline: 41 Goal status: IN PROGRESS   7.  Patient will tolerate >/= 30 min of standing and sitting to perform household chores with decreased pain interference. Baseline:  Goal status: IN PROGRESS  8.  Patient will improve walking tolerance to >/= 30 minutes w/o pain interference to allow improved tolerance for shopping or walking for exercise. Baseline:  Goal status: IN PROGRESS   PLAN: PT FREQUENCY: 2x/week  PT  DURATION: 6 weeks  PLANNED INTERVENTIONS: Therapeutic exercises, Therapeutic activity, Neuromuscular re-education, Balance training, Gait training, Patient/Family education, Joint mobilization, Stair training, Aquatic Therapy, Dry Needling, Electrical stimulation, Spinal mobilization, Cryotherapy, Moist heat, Taping, Traction, Ultrasound, Ionotophoresis 51m/ml Dexamethasone, Manual therapy, and Re-evaluation.  PLAN FOR NEXT SESSION: progress lumbopelvic/LE stretching and strengthening in multiple positions; review update HEP as indicated   JPercival Spanish PT 08/23/2021, 12:40 PM

## 2021-08-27 ENCOUNTER — Encounter: Payer: Self-pay | Admitting: Physical Therapy

## 2021-08-27 ENCOUNTER — Ambulatory Visit: Payer: Medicare Other | Admitting: Physical Therapy

## 2021-08-27 DIAGNOSIS — M62830 Muscle spasm of back: Secondary | ICD-10-CM

## 2021-08-27 DIAGNOSIS — M6281 Muscle weakness (generalized): Secondary | ICD-10-CM

## 2021-08-27 DIAGNOSIS — R262 Difficulty in walking, not elsewhere classified: Secondary | ICD-10-CM

## 2021-08-27 DIAGNOSIS — G8929 Other chronic pain: Secondary | ICD-10-CM

## 2021-08-27 DIAGNOSIS — R252 Cramp and spasm: Secondary | ICD-10-CM

## 2021-08-27 DIAGNOSIS — M5441 Lumbago with sciatica, right side: Secondary | ICD-10-CM | POA: Diagnosis not present

## 2021-08-27 NOTE — Therapy (Signed)
OUTPATIENT PHYSICAL THERAPY TREATMENT   Patient Name: Monique Gonzalez MRN: [id number] DOB:1955/12/23, 66 y.o., female Today's Date: 08/27/2021   PT End of Session - 08/27/21 1015     Visit Number 5    Number of Visits 13    Date for PT Re-Evaluation 09/24/21    Authorization Type Medicare & [employer]    PT Start Time 1015    PT Stop Time 1059    PT Time Calculation (min) 44 min    Activity Tolerance Patient tolerated treatment well    Behavior During Therapy [facility] for tasks assessed/performed                 Past Medical History:  Diagnosis Date   Allergy    Anxiety    Phreesia 06/04/2020   Arthritis    Phreesia 07/08/2020   Chronic pain    back and knee   Colitis 09/2012   infectious vs inflammatory.    Colon stricture [facility])    Depression    Depression    Phreesia 06/04/2020   Depression    Phreesia 07/08/2020   GERD (gastroesophageal reflux disease)    Phreesia 06/04/2020   Hyperlipidemia    Obesity    Osteoarthritis    Osteopenia    Recurrent cold sores    Seasonal allergies    Syncope and collapse 11/30/2012   Normal EEG.  Vaso vagal syncope   Past Surgical History:  Procedure Laterality Date   BIOPSY  02/21/2020   Procedure: BIOPSY;  Surgeon: Gatha Mayer, MD;  Location: [place];  Service: Endoscopy;;   COLON RESECTION N/A 10/14/2013   Procedure: LAPAROSCOPIC MOBILIZATION OF SPLENIC FLEXURE, LAPAROSCOPIC EXTENDED LEFT COLECTOMY;  Surgeon: [name], MD;  Location: [place];  Service: General;  Laterality: N/A;   COLON SURGERY N/A    Phreesia 06/04/2020   COLONOSCOPY N/A 10/12/2013   Procedure: COLONOSCOPY;  Surgeon: [name], MD;  Location: [place];  Service: Endoscopy;  Laterality: N/A;   COLONOSCOPY WITH PROPOFOL N/A 02/21/2020   Procedure: COLONOSCOPY WITH PROPOFOL;  Surgeon: Gatha Mayer, MD;  Location: [facility] ENDOSCOPY;  Service: Endoscopy;  Laterality: N/A;   DILATION AND CURETTAGE, DIAGNOSTIC / THERAPEUTIC  1987   FRACTURE  SURGERY N/A    Phreesia 06/04/2020   JOINT REPLACEMENT N/A    Phreesia 06/04/2020   OPEN REDUCTION INTERNAL FIXATION (ORIF) DISTAL RADIAL FRACTURE Right 08/08/2014   Procedure: OPEN REDUCTION INTERNAL FIXATION (ORIF) DISTAL RADIAL FRACTURE;  Surgeon: Roseanne Kaufman, MD;  Location: [place];  Service: Orthopedics;  Laterality: Right;  DVR Crosslock, MD available sometime around 1430-1500   ORIF ANKLE FRACTURE Right 11/10/2019   Procedure: POSSIBLEOPEN REDUCTION INTERNAL FIXATION (ORIF) ANKLE FRACTURE;  Surgeon: Shona Needles, MD;  Location: [place];  Service: Orthopedics;  Laterality: Right;   ORIF WRIST FRACTURE Left 11/10/2019   Procedure: OPEN REDUCTION INTERNAL FIXATION (ORIF) WRIST FRACTURE;  Surgeon: Shona Needles, MD;  Location: [place];  Service: Orthopedics;  Laterality: Left;   TONSILLECTOMY     TOTAL KNEE ARTHROPLASTY Right 08/05/2016   TOTAL KNEE ARTHROPLASTY Right 08/05/2016   Procedure: RIGHT TOTAL KNEE ARTHROPLASTY;  Surgeon: Mcarthur Rossetti, MD;  Location: [place];  Service: Orthopedics;  Laterality: Right;   Patient Active Problem List   Diagnosis Date Noted   Ischemic stricture intestine ([place])    Colitis 02/19/2020   Closed fracture of left distal radius 11/10/2019   Fall 11/10/2019   Closed right ankle fracture 11/09/2019  Ankle fracture 11/09/2019   Closed displaced fracture of styloid process of left ulna    Anxiety and depression 12/22/2016   Insulin resistance 10/15/2016   Presence of right artificial knee joint 10/14/2016   Vitamin D deficiency 04/08/2016   Constipation 09/20/2013   Recurrent cold sores    Osteoarthritis    Depression    Hyperlipidemia     PCP: Susy Frizzle, MD  REFERRING PROVIDER: Consuella Lose, MD  REFERRING DIAG: [phone] (ICD-10-CM) - Spinal stenosis, lumbar region with neurogenic claudication  RATIONALE FOR EVALUATION AND TREATMENT: Rehabilitation  THERAPY DIAG:  Chronic bilateral low back pain with right-sided  sciatica  Muscle spasm of back  Cramp and spasm  Muscle weakness (generalized)  Difficulty in walking, not elsewhere classified  ONSET DATE: chronic  SUBJECTIVE:                                                                                                                                                                                           SUBJECTIVE STATEMENT: Pt reports she feels "stiff" today but no pain - maybe due to inclement weather. She notes return of back pain on Saturday w/o known trigger but no pain since. Numb spot on dorsum of R foot/ankle back to baseline. No recent falls.  PAIN:  Are you having pain? No  PERTINENT HISTORY:  Fall with L wrist and R ankle fx 11/2019, R TKA 08/05/16, OA, osteopenia, anxiety, depression, syncope, HLD  PRECAUTIONS: None  WEIGHT BEARING RESTRICTIONS No  FALLS:  Has patient fallen in last 6 months? Yes. Number of falls 1 - syncopal episode while in [place]  LIVING ENVIRONMENT: Lives with: lives with their family and 2 sisters Lives in: House/apartment Stairs: Yes: Internal: 14 steps; on right going up and External: 1-2 steps; none Has following equipment at home: Single point cane, Grab bars, and chair lift for stairs  OCCUPATION: Retired  PLOF: Independent and Leisure: exercise - bike daily x ~30-60 min, would like to walk but limited by LBP  PATIENT GOALS: "To be able to walk for exercise w/o limitation d/t LBP."   OBJECTIVE:   DIAGNOSTIC FINDINGS:  06/22/21 - Lumbar MRI: 1. Multilevel lumbar spondylosis as described above, worst at L4-L5 where there is severe spinal canal and lateral recess stenosis.  2. Mild spinal canal stenosis at L2-L3 and L3-L4. Moderate right neuroforaminal stenosis at L3-L4.  PATIENT SURVEYS:  FOTO Lumbar = 41, predicted = 55  SCREENING FOR RED FLAGS: Bowel or bladder incontinence: No Spinal tumors: No Cauda equina syndrome: No Compression fracture: No Abdominal aneurysm:  No  COGNITION:  Overall cognitive status: Within functional limits for  tasks assessed     SENSATION: WFL Except numbness at medial knee and anterior ankle  MUSCLE LENGTH: Hamstrings: mild tight B ITB: mild/mod tight R>L Piriformis: mod tight L>R Hip flexors: mild tight R>L Quads: mild tight R>L  POSTURE: increased lumbar lordosis  PALPATION: Increased muscle tension and TTP in B lower lumbar paraspinals, SIJ, glutes and piriformis, R>L. Hypomobile spinal mobility with increased pain in lower lumbar vertebrae and sacrum.  LUMBAR ROM:   Active  A/PROM  08/13/21  Flexion Fingertips to toes  Extension 25% limited  Right lateral flexion Hand to lower thigh - pain/tightness  Left lateral flexion Hand to lower thigh - pain/tightness  Right rotation [facility] - pain  Left rotation WFL - pain   (Blank rows = not tested)  LOWER EXTREMITY ROM:       B LE AROM grossly WFL/WNL  LOWER EXTREMITY MMT:    MMT Right 08/13/21 Left 08/13/21  Hip flexion 3+/5 4/5  Hip extension 3-/5 3-/5  Hip abduction 3/5 3+/5  Hip adduction 3-/5 3/5  Hip internal rotation 4/5 4+/5  Hip external rotation 4-/5 4-/5  Knee flexion 4/5 4+/5  Knee extension 4/5 4+/5  Ankle dorsiflexion 4-/5 4+/5  Ankle plantarflexion 3-/5  (limited lift with SLS heel raise) 4/5  (9 SLS heel raises)  Ankle inversion    Ankle eversion     (Blank rows = not tested)  LUMBAR SPECIAL TESTS:  Straight leg raise test: Positive on Rt   TODAY'S TREATMENT   08/27/21 THERAPEUTIC EXERCISE: to improve flexibility, strength and mobility.  Verbal and tactile cues throughout for technique. NuStep L5 x 6 min Seated: B GTB rows 10 x 3", seated on Dynadisc with feet on Airex pad - cues for abdominal bracing B GTB scap retraction/shoulder ext + TrA 10 x 3", seated on Dynadisc with feet on Airex pad  R/L GTB pallof press 10 x 3", seated on Dynadisc with feet on Airex pad  B UE diagonals holding yellow (2000 Gr) med ball x 10 each side,  seated on Dynadisc with feet on Airex pad - cues for abdominal bracing and avoiding excessive twisting Standing: B GTB rows 10 x 3" - slight staggered stance, cues for abdominal bracing B GTB scap retraction/shoulder ext + TrA 10 x 3" - slight opp staggered stance, cues for abdominal bracing R/L GTB pallof press 10 x 3" B heel raises with counter support x 12 Counter squat x 12   08/23/21 THERAPEUTIC EXERCISE: to improve flexibility, strength and mobility.  Verbal and tactile cues throughout for technique. NuStep L5 x 6 min B heel raises with counter support 2 x 10 Counter squat 2 x 10 R/L standing 3-way hip SLR (flexion, abduction, extension) x 10 each B GTB rows 10 x 3" - cues for abdominal bracing B GTB scap retraction/shoulder ext + TrA 10 x 3" R/L GTB pallof press 10 x 3" Seated 3-way lumbar flexion stretch with UE support on cane x 30 sec each direction Seated TrA + alt LAQ x 10 Seated TrA + R/L GTB HS curls x 10 Hooklying PPT + RTB alt LE hip ABD/ER bent knee fallout 10 x 3" Bridge + red TB hip ABD isometric 10 x 3"    08/20/21 THERAPEUTIC EXERCISE: to improve flexibility, strength and mobility.  Verbal and tactile cues throughout for technique. NuStep L4 x 6 min Hooklying PPT + hip ADD ball squeeze 10 x 5" Hooklying PPT + RTB march 10 x 3" Hooklying PPT + RTB alt LE  hip ABD/ER bent knee fallout 10 x 3" Bridge + red TB hip ABD isometric 10 x 3" - cues to avoid pressing into heel on lift to reduce risk for HS cramping Bridge + hip ADD ball squeeze isometric 10 x 3" Hooklying R KTOS piriformis stretch x 30 sec Hooklying R figure 4 stretch x 30 sec Hooklying ITB stretch with strap x 30 bil Seated 3-way lumbar flexion stretch with UE support on cane x 30 sec each direction   PATIENT EDUCATION:  Education details: HEP progression - upright strengthening progression with overall frequency for strengthening exercises reduce to 3x/wk and pt instructed to vary exercises on  alternating days   Person educated: Patient Education method: Explanation, Demonstration, and Handouts Education comprehension: verbalized understanding and returned demonstration   HOME EXERCISE PROGRAM: Access Code: [facility]  Exercises - Static Prone on Elbows  - 2 x daily - 7 x weekly - 3 reps - 30-60 sec hold - Prone Press Up  - 2 x daily - 7 x weekly - 2 sets - 10 reps - 3-5 sec hold - Standing Lumbar Extension with Counter  - 2 x daily - 7 x weekly - 2 sets - 10 reps - 3-5 sec hold - Seated Flexion Stretch with Swiss Ball  - 2 x daily - 7 x weekly - 2 sets - 2 reps - 30 sec hold - Supine Posterior Pelvic Tilt  - 1 x daily - 7 x weekly - 2-3 sets - 10 reps - 3 sec hold - Supine Figure 4 Piriformis Stretch  - 2 x daily - 7 x weekly - 3 reps - 30 sec hold - Supine Piriformis Stretch with Foot on Ground  - 2 x daily - 7 x weekly - 3 reps - 30 sec hold - Supine ITB Stretch with Strap  - 2 x daily - 7 x weekly - 3 reps - 30 sec hold - Supine March with Resistance Band  - 1 x daily - 3 x weekly - 2 sets - 10 reps - 2-3 sec hold hold - Hooklying Single Leg Bent Knee Fallouts with Resistance  - 1 x daily - 3 x weekly - 2 sets - 10 reps - 3 sec hold - Supine Bridge with Mini Swiss Ball Between Knees  - 1 x daily - 3 x weekly - 2 sets - 10 reps - 5 sec hold - Standing Shoulder Row with Anchored Resistance  - 1 x daily - 3 x weekly - 2 sets - 10 reps - 5 sec hold - Scapular Retraction with Resistance Advanced  - 1 x daily - 3 x weekly - 2 sets - 10 reps - 5 sec hold - Squat with Chair and Counter Support  - 1 x daily - 3 x weekly - 2 sets - 10 reps - 3 sec hold - Heel Toe Raises with Counter Support  - 1 x daily - 3 x weekly - 2 sets - 10 reps - 3 sec hold   ASSESSMENT:  CLINICAL IMPRESSION: Shonta reports return of LBP on Saturday w/o known trigger, but states pain now resolved and just noting "stiffness" which thinks may be related to the inclement weather. Upper body core strengthening  completed in both sitting on Dynadisc and standing with pt noting better awareness of abdominal muscle recruitment in standing, therefore HEP updated to include some of standing exercises. Frequency for HEP strengthening exercises reduced to 3x/wk with pt instructed to alternate exercises and on opposite days to  allow for muscle recovery.   tOBJECTIVE IMPAIRMENTS decreased activity tolerance, decreased balance, decreased endurance, decreased knowledge of condition, decreased mobility, difficulty walking, decreased ROM, decreased strength, hypomobility, increased fascial restrictions, impaired perceived functional ability, increased muscle spasms, impaired flexibility, impaired sensation, improper body mechanics, postural dysfunction, and pain.   ACTIVITY LIMITATIONS sitting, standing, stairs, and locomotion level  PARTICIPATION LIMITATIONS: meal prep, cleaning, laundry, shopping, community activity, yard work, and exercising for weight loss  PERSONAL FACTORS Past/current experiences, Time since onset of injury/illness/exacerbation, and 3+ comorbidities: Fall with L wrist and R ankle fx 11/2019, R TKA 08/05/16, OA, osteopenia, anxiety, depression, syncope, HLD  are also affecting patient's functional outcome.   REHAB POTENTIAL: Good  CLINICAL DECISION MAKING: Evolving/moderate complexity  EVALUATION COMPLEXITY: Moderate   GOALS: Goals reviewed with patient? Yes  SHORT TERM GOALS: Target date: 09/03/2021   Patient will be independent with initial HEP.  Baseline:  Goal status: MET  08/23/21  2.  Patient will report centralization of radicular symptoms.  Baseline:  Goal status: MET  08/20/21 - Pt reports no radicular symptoms since the ESI   LONG TERM GOALS: Target date: 09/24/2021    Patient will be independent with advanced/ongoing HEP to improve outcomes and carryover.  Baseline:  Goal status: IN PROGRESS  2.  Patient will report 75% improvement in low back & R LE radicular pain to  improve QOL.  Baseline:  Goal status: IN PROGRESS  3.  Patient to demonstrate ability to achieve and maintain good spinal alignment/posturing and body mechanics needed for daily activities. Baseline:  Goal status: IN PROGRESS  4.  Patient will demonstrate full pain free lumbar ROM to perform ADLs.   Baseline:  Goal status: IN PROGRESS  5.  Patient will demonstrate improved B LE strength to >/= 4 to 4+/5 for improved stability and ease of mobility . Baseline:  Goal status: IN PROGRESS  6.  Patient will report 24 on lumbar FOTO to demonstrate improved functional ability.  Baseline: 41 Goal status: IN PROGRESS   7.  Patient will tolerate >/= 30 min of standing and sitting to perform household chores with decreased pain interference. Baseline:  Goal status: IN PROGRESS  8.  Patient will improve walking tolerance to >/= 30 minutes w/o pain interference to allow improved tolerance for shopping or walking for exercise. Baseline:  Goal status: IN PROGRESS   PLAN: PT FREQUENCY: 2x/week  PT DURATION: 6 weeks  PLANNED INTERVENTIONS: Therapeutic exercises, Therapeutic activity, Neuromuscular re-education, Balance training, Gait training, Patient/Family education, Joint mobilization, Stair training, Aquatic Therapy, Dry Needling, Electrical stimulation, Spinal mobilization, Cryotherapy, Moist heat, Taping, Traction, Ultrasound, Ionotophoresis 39m/ml Dexamethasone, Manual therapy, and Re-evaluation.  PLAN FOR NEXT SESSION: progress lumbopelvic/LE stretching and strengthening in multiple positions; review update HEP as indicated   JPercival Spanish PT 08/27/2021, 12:23 PM

## 2021-08-30 ENCOUNTER — Ambulatory Visit: Payer: Medicare Other

## 2021-08-30 DIAGNOSIS — M6281 Muscle weakness (generalized): Secondary | ICD-10-CM

## 2021-08-30 DIAGNOSIS — R252 Cramp and spasm: Secondary | ICD-10-CM

## 2021-08-30 DIAGNOSIS — R262 Difficulty in walking, not elsewhere classified: Secondary | ICD-10-CM

## 2021-08-30 DIAGNOSIS — M5441 Lumbago with sciatica, right side: Secondary | ICD-10-CM | POA: Diagnosis not present

## 2021-08-30 DIAGNOSIS — M62830 Muscle spasm of back: Secondary | ICD-10-CM

## 2021-08-30 DIAGNOSIS — G8929 Other chronic pain: Secondary | ICD-10-CM

## 2021-09-01 ENCOUNTER — Other Ambulatory Visit: Payer: Self-pay | Admitting: Family Medicine

## 2021-09-01 DIAGNOSIS — M545 Low back pain, unspecified: Secondary | ICD-10-CM

## 2021-09-01 DIAGNOSIS — G8929 Other chronic pain: Secondary | ICD-10-CM

## 2021-09-03 ENCOUNTER — Ambulatory Visit: Payer: Medicare Other | Admitting: Physical Therapy

## 2021-09-03 DIAGNOSIS — R252 Cramp and spasm: Secondary | ICD-10-CM

## 2021-09-03 DIAGNOSIS — M62830 Muscle spasm of back: Secondary | ICD-10-CM

## 2021-09-03 DIAGNOSIS — R262 Difficulty in walking, not elsewhere classified: Secondary | ICD-10-CM

## 2021-09-03 DIAGNOSIS — M6281 Muscle weakness (generalized): Secondary | ICD-10-CM

## 2021-09-03 DIAGNOSIS — G8929 Other chronic pain: Secondary | ICD-10-CM

## 2021-09-03 DIAGNOSIS — M5441 Lumbago with sciatica, right side: Secondary | ICD-10-CM | POA: Diagnosis not present

## 2021-09-03 NOTE — Therapy (Signed)
OUTPATIENT PHYSICAL THERAPY TREATMENT   Patient Name: Monique Gonzalez MRN: [id number] DOB:1955/12/16, 66 y.o., female Today's Date: 09/03/2021   PT End of Session - 09/03/21 1015     Visit Number 7    Number of Visits 13    Date for PT Re-Evaluation 09/24/21    Authorization Type Medicare & [employer]    PT Start Time 1015    PT Stop Time 1103    PT Time Calculation (min) 48 min    Activity Tolerance Patient tolerated treatment well    Behavior During Therapy [facility] for tasks assessed/performed                   Past Medical History:  Diagnosis Date   Allergy    Anxiety    Phreesia 06/04/2020   Arthritis    Phreesia 07/08/2020   Chronic pain    back and knee   Colitis 09/2012   infectious vs inflammatory.    Colon stricture [facility])    Depression    Depression    Phreesia 06/04/2020   Depression    Phreesia 07/08/2020   GERD (gastroesophageal reflux disease)    Phreesia 06/04/2020   Hyperlipidemia    Obesity    Osteoarthritis    Osteopenia    Recurrent cold sores    Seasonal allergies    Syncope and collapse 11/30/2012   Normal EEG.  Vaso vagal syncope   Past Surgical History:  Procedure Laterality Date   BIOPSY  02/21/2020   Procedure: BIOPSY;  Surgeon: Iva Boop, MD;  Location: [facility] ENDOSCOPY;  Service: Endoscopy;;   COLON RESECTION N/A 10/14/2013   Procedure: LAPAROSCOPIC MOBILIZATION OF SPLENIC FLEXURE, LAPAROSCOPIC EXTENDED LEFT COLECTOMY;  Surgeon: Atilano Ina, MD;  Location: [facility] OR;  Service: General;  Laterality: N/A;   COLON SURGERY N/A    Phreesia 06/04/2020   COLONOSCOPY N/A 10/12/2013   Procedure: COLONOSCOPY;  Surgeon: Beverley Fiedler, MD;  Location: [facility] ENDOSCOPY;  Service: Endoscopy;  Laterality: N/A;   COLONOSCOPY WITH PROPOFOL N/A 02/21/2020   Procedure: COLONOSCOPY WITH PROPOFOL;  Surgeon: Iva Boop, MD;  Location: [facility] ENDOSCOPY;  Service: Endoscopy;  Laterality: N/A;   DILATION AND CURETTAGE, DIAGNOSTIC / THERAPEUTIC  1987    FRACTURE SURGERY N/A    Phreesia 06/04/2020   JOINT REPLACEMENT N/A    Phreesia 06/04/2020   OPEN REDUCTION INTERNAL FIXATION (ORIF) DISTAL RADIAL FRACTURE Right 08/08/2014   Procedure: OPEN REDUCTION INTERNAL FIXATION (ORIF) DISTAL RADIAL FRACTURE;  Surgeon: Dominica Severin, MD;  Location: [facility] OR;  Service: Orthopedics;  Laterality: Right;  DVR Crosslock, MD available sometime around 1430-1500   ORIF ANKLE FRACTURE Right 11/10/2019   Procedure: POSSIBLEOPEN REDUCTION INTERNAL FIXATION (ORIF) ANKLE FRACTURE;  Surgeon: Roby Lofts, MD;  Location: [facility] OR;  Service: Orthopedics;  Laterality: Right;   ORIF WRIST FRACTURE Left 11/10/2019   Procedure: OPEN REDUCTION INTERNAL FIXATION (ORIF) WRIST FRACTURE;  Surgeon: Roby Lofts, MD;  Location: [facility] OR;  Service: Orthopedics;  Laterality: Left;   TONSILLECTOMY     TOTAL KNEE ARTHROPLASTY Right 08/05/2016   TOTAL KNEE ARTHROPLASTY Right 08/05/2016   Procedure: RIGHT TOTAL KNEE ARTHROPLASTY;  Surgeon: Kathryne Hitch, MD;  Location: [facility] OR;  Service: Orthopedics;  Laterality: Right;   Patient Active Problem List   Diagnosis Date Noted   Ischemic stricture intestine (HCC)    Colitis 02/19/2020   Closed fracture of left distal radius 11/10/2019   Fall 11/10/2019   Closed right ankle fracture  11/09/2019   Ankle fracture 11/09/2019   Closed displaced fracture of styloid process of left ulna    Anxiety and depression 12/22/2016   Insulin resistance 10/15/2016   Presence of right artificial knee joint 10/14/2016   Vitamin D deficiency 04/08/2016   Constipation 09/20/2013   Recurrent cold sores    Osteoarthritis    Depression    Hyperlipidemia     PCP: Donita Brooks, MD  REFERRING PROVIDER: Lisbeth Renshaw, MD  REFERRING DIAG: [phone] (ICD-10-CM) - Spinal stenosis, lumbar region with neurogenic claudication  RATIONALE FOR EVALUATION AND TREATMENT: Rehabilitation  THERAPY DIAG:  Chronic bilateral low back pain with right-sided  sciatica  Muscle spasm of back  Cramp and spasm  Muscle weakness (generalized)  Difficulty in walking, not elsewhere classified  ONSET DATE: chronic  SUBJECTIVE:                                                                                                                                                                                           SUBJECTIVE STATEMENT: Pt reports her pain remains well controlled - she has not had a "seizing muscle spasm" since she has been doing the stretches and other exercises. No other issues noted other than just feeling like she needs to continue to build her strength.  PAIN:  Are you having pain? No  PERTINENT HISTORY:  Fall with L wrist and R ankle fx 11/2019, R TKA 08/05/16, OA, osteopenia, anxiety, depression, syncope, HLD  PRECAUTIONS: None  WEIGHT BEARING RESTRICTIONS No  FALLS:  Has patient fallen in last 6 months? Yes. Number of falls 1 - syncopal episode while in [place]  LIVING ENVIRONMENT: Lives with: lives with their family and 2 sisters Lives in: House/apartment Stairs: Yes: Internal: 14 steps; on right going up and External: 1-2 steps; none Has following equipment at home: Single point cane, Grab bars, and chair lift for stairs  OCCUPATION: Retired  PLOF: Independent and Leisure: exercise - bike daily x ~30-60 min, would like to walk but limited by LBP  PATIENT GOALS: "To be able to walk for exercise w/o limitation d/t LBP."   OBJECTIVE:   DIAGNOSTIC FINDINGS:  06/22/21 - Lumbar MRI: 1. Multilevel lumbar spondylosis as described above, worst at L4-L5 where there is severe spinal canal and lateral recess stenosis.  2. Mild spinal canal stenosis at L2-L3 and L3-L4. Moderate right neuroforaminal stenosis at L3-L4.  PATIENT SURVEYS:  FOTO Lumbar = 41, predicted = 55  SCREENING FOR RED FLAGS: Bowel or bladder incontinence: No Spinal tumors: No Cauda equina syndrome: No Compression fracture: No Abdominal aneurysm:  No  COGNITION:  Overall cognitive status: Within  functional limits for tasks assessed     SENSATION: WFL Except numbness at medial knee and anterior ankle  MUSCLE LENGTH: Hamstrings: mild tight B ITB: mild/mod tight R>L Piriformis: mod tight L>R Hip flexors: mild tight R>L Quads: mild tight R>L  POSTURE: increased lumbar lordosis  PALPATION: Increased muscle tension and TTP in B lower lumbar paraspinals, SIJ, glutes and piriformis, R>L. Hypomobile spinal mobility with increased pain in lower lumbar vertebrae and sacrum.  LUMBAR ROM:   Active  A/PROM  08/13/21  Flexion Fingertips to toes  Extension 25% limited  Right lateral flexion Hand to lower thigh - pain/tightness  Left lateral flexion Hand to lower thigh - pain/tightness  Right rotation [facility] - pain  Left rotation WFL - pain   (Blank rows = not tested)  LOWER EXTREMITY ROM:       B LE AROM grossly WFL/WNL  LOWER EXTREMITY MMT:    MMT Right 08/13/21 Left 08/13/21 Right 09/03/21 Left 09/03/21  Hip flexion 3+/5 4/5 4+ 4+  Hip extension 3-/5 3-/5 4-/5 4-/5  Hip abduction 3/5 3+/5 4-/5 4-/5  Hip adduction 3-/5 3/5 4-/5 4-/5  Hip internal rotation 4/5 4+/5 4+/5 4+/5  Hip external rotation 4-/5 4-/5 4-/5 4-/5  Knee flexion 4/5 4+/5 5 5   Knee extension 4/5 4+/5 5 5   Ankle dorsiflexion 4-/5 4+/5 5-/5 5  Ankle plantarflexion 3-/5  (limited lift with SLS heel raise) 4/5  (9 SLS heel raises)    Ankle inversion      Ankle eversion       (Blank rows = not tested)  LUMBAR SPECIAL TESTS:  Straight leg raise test: Positive on Rt   TODAY'S TREATMENT   09/03/21 THERAPEUTIC EXERCISE: to improve flexibility, strength and mobility.  Verbal and tactile cues throughout for technique. NuStep L5 x 6 min Standing B GTB rows 10 x 3", 2 sets - staggered stance with each foot fwd for 1 set, cues for abdominal bracing Standing B GTB scap retraction/shoulder ext + TrA 10 x 3" - staggered stance with each foot fwd for 1 set, cues for  abdominal bracing Standing R/L GTB pallof press + short arc trunk rotation 10 x 3" Standing B side-stepping with looped RTB at ankles 2 x 20 ft Standing fwd/back monster walk with looped RTB at ankles x 20 ft each direction Seated 3-way lumbar flexion stretch with UE support on green Pball 2 x 30 sec each direction Supported squat (UE support on TM rail) + RTB hip ABD isometric 10 x 3" Standing B heel/toe raises with intermittent TM rail support 2 x 12   08/30/21 Therapeutic Exercise: Nustep L5x54min Gait 147ft 4x 90/90 hip flexion with unilateral lowering of legs 10 reps each Bridges with feet on BOSU 10 reps Trunk rotations feet straight on green pball 10 reps Straight leg bridge with green pball 10 reps Standing pallof press 10 reps bil GTB   08/27/21 THERAPEUTIC EXERCISE: to improve flexibility, strength and mobility.  Verbal and tactile cues throughout for technique. NuStep L5 x 6 min Seated: B GTB rows 10 x 3", seated on Dynadisc with feet on Airex pad - cues for abdominal bracing B GTB scap retraction/shoulder ext + TrA 10 x 3", seated on Dynadisc with feet on Airex pad  R/L GTB pallof press 10 x 3", seated on Dynadisc with feet on Airex pad  B UE diagonals holding yellow (2000 Gr) med ball x 10 each side, seated on Dynadisc with feet on Airex pad - cues for abdominal bracing  and avoiding excessive twisting Standing: B GTB rows 10 x 3" - slight staggered stance, cues for abdominal bracing B GTB scap retraction/shoulder ext + TrA 10 x 3" - slight opp staggered stance, cues for abdominal bracing R/L GTB pallof press 10 x 3" B heel raises with counter support x 12 Counter squat x 12   PATIENT EDUCATION:  Education details: HEP progression - red TB side-stepping and monster walks   Person educated: Patient Education method: Explanation, Demonstration, and Handouts Education comprehension: verbalized understanding and returned demonstration   HOME EXERCISE PROGRAM: Access  Code: [facility] URL: [email or web address] Date: 09/03/2021 Prepared by: Glenetta Hew  Exercises - Static Prone on Elbows  - 2 x daily - 7 x weekly - 3 reps - 30-60 sec hold - Prone Press Up  - 2 x daily - 7 x weekly - 2 sets - 10 reps - 3-5 sec hold - Standing Lumbar Extension with Counter  - 2 x daily - 7 x weekly - 2 sets - 10 reps - 3-5 sec hold - Seated Flexion Stretch with Swiss Ball  - 2 x daily - 7 x weekly - 2 sets - 2 reps - 30 sec hold - Supine Posterior Pelvic Tilt  - 1 x daily - 7 x weekly - 2-3 sets - 10 reps - 3 sec hold - Supine Figure 4 Piriformis Stretch  - 2 x daily - 7 x weekly - 3 reps - 30 sec hold - Supine Piriformis Stretch with Foot on Ground  - 2 x daily - 7 x weekly - 3 reps - 30 sec hold - Supine ITB Stretch with Strap  - 2 x daily - 7 x weekly - 3 reps - 30 sec hold - Supine March with Resistance Band  - 1 x daily - 3 x weekly - 2 sets - 10 reps - 2-3 sec hold hold - Hooklying Single Leg Bent Knee Fallouts with Resistance  - 1 x daily - 3 x weekly - 2 sets - 10 reps - 3 sec hold - Supine Bridge with Mini Swiss Ball Between Knees  - 1 x daily - 3 x weekly - 2 sets - 10 reps - 5 sec hold - Standing Shoulder Row with Anchored Resistance  - 1 x daily - 3 x weekly - 2 sets - 10 reps - 5 sec hold - Scapular Retraction with Resistance Advanced  - 1 x daily - 3 x weekly - 2 sets - 10 reps - 5 sec hold - Squat with Chair and Counter Support  - 1 x daily - 3 x weekly - 2 sets - 10 reps - 3 sec hold - Heel Toe Raises with Counter Support  - 1 x daily - 3 x weekly - 2 sets - 10 reps - 3 sec hold - Side Stepping with Resistance at Feet  - 1 x daily - 3 x weekly - 2 sets - 10 reps - Band Walks  - 1 x daily - 3 x weekly - 2 sets - 10 reps   ASSESSMENT:  CLINICAL IMPRESSION: Monique Gonzalez reports good pain control as well as relief from muscle spasms since starting PT. She notes she is now able to go through the grocery store w/o limitation due to her back at this time.  She requests preference for standing exercises to improve her standing and walking tolerance as she reports she will be having to take over dog walking duty when her sister has her knee replacement next month.  Good tolerance for exercise progression but occasional seated rest breaks necessary. Overall LE strength is improving per MMT but continued proximal weakness still evident that will benefit from continued skilled PT.  OBJECTIVE IMPAIRMENTS decreased activity tolerance, decreased balance, decreased endurance, decreased knowledge of condition, decreased mobility, difficulty walking, decreased ROM, decreased strength, hypomobility, increased fascial restrictions, impaired perceived functional ability, increased muscle spasms, impaired flexibility, impaired sensation, improper body mechanics, postural dysfunction, and pain.   ACTIVITY LIMITATIONS sitting, standing, stairs, and locomotion level  PARTICIPATION LIMITATIONS: meal prep, cleaning, laundry, shopping, community activity, yard work, and exercising for weight loss  PERSONAL FACTORS Past/current experiences, Time since onset of injury/illness/exacerbation, and 3+ comorbidities: Fall with L wrist and R ankle fx 11/2019, R TKA 08/05/16, OA, osteopenia, anxiety, depression, syncope, HLD  are also affecting patient's functional outcome.   REHAB POTENTIAL: Good  CLINICAL DECISION MAKING: Evolving/moderate complexity  EVALUATION COMPLEXITY: Moderate   GOALS: Goals reviewed with patient? Yes  SHORT TERM GOALS: Target date: 09/03/2021   Patient will be independent with initial HEP.  Baseline:  Goal status: MET  08/23/21  2.  Patient will report centralization of radicular symptoms.  Baseline:  Goal status: MET  08/20/21 - Pt reports no radicular symptoms since the ESI   LONG TERM GOALS: Target date: 09/24/2021    Patient will be independent with advanced/ongoing HEP to improve outcomes and carryover.  Baseline:  Goal status: IN  PROGRESS  2.  Patient will report 75% improvement in low back & R LE radicular pain to improve QOL.  Baseline:  Goal status: MET  08/30/21 - pt reports 100% improvement   3.  Patient to demonstrate ability to achieve and maintain good spinal alignment/posturing and body mechanics needed for daily activities. Baseline:  Goal status: IN PROGRESS  4.  Patient will demonstrate full pain free lumbar ROM to perform ADLs.   Baseline:  Goal status: IN PROGRESS  5.  Patient will demonstrate improved B LE strength to >/= 4 to 4+/5 for improved stability and ease of mobility . Baseline:  Goal status: IN PROGRESS  09/03/21 - Partially met (refer to above MMT chart)  6.  Patient will report 42 on lumbar FOTO to demonstrate improved functional ability.  Baseline: 41 Goal status: IN PROGRESS   7.  Patient will tolerate >/= 30 min of standing and sitting to perform household chores with decreased pain interference. Baseline:  Goal status: IN PROGRESS  8.  Patient will improve walking tolerance to >/= 30 minutes w/o pain interference to allow improved tolerance for shopping or walking for exercise. Baseline:  Goal status: IN PROGRESS  09/03/21 - Partially met - able to walk through grocery store w/o increased pain or need to lean on shopping cart    PLAN: PT FREQUENCY: 2x/week  PT DURATION: 6 weeks  PLANNED INTERVENTIONS: Therapeutic exercises, Therapeutic activity, Neuromuscular re-education, Balance training, Gait training, Patient/Family education, Joint mobilization, Stair training, Aquatic Therapy, Dry Needling, Electrical stimulation, Spinal mobilization, Cryotherapy, Moist heat, Taping, Traction, Ultrasound, Ionotophoresis 4mg /ml Dexamethasone, Manual therapy, and Re-evaluation.  PLAN FOR NEXT SESSION: progress lumbopelvic/LE stretching and strengthening in multiple positions; review update HEP as indicated   Marry Guan, PT 09/03/2021, 12:45 PM

## 2021-09-17 ENCOUNTER — Ambulatory Visit: Payer: Medicare Other | Attending: Neurosurgery | Admitting: Physical Therapy

## 2021-09-17 ENCOUNTER — Encounter: Payer: Self-pay | Admitting: Physical Therapy

## 2021-09-17 DIAGNOSIS — G8929 Other chronic pain: Secondary | ICD-10-CM | POA: Diagnosis present

## 2021-09-17 DIAGNOSIS — M6281 Muscle weakness (generalized): Secondary | ICD-10-CM | POA: Diagnosis present

## 2021-09-17 DIAGNOSIS — R262 Difficulty in walking, not elsewhere classified: Secondary | ICD-10-CM | POA: Diagnosis present

## 2021-09-17 DIAGNOSIS — M5441 Lumbago with sciatica, right side: Secondary | ICD-10-CM | POA: Insufficient documentation

## 2021-09-17 DIAGNOSIS — R252 Cramp and spasm: Secondary | ICD-10-CM | POA: Insufficient documentation

## 2021-09-17 DIAGNOSIS — M62830 Muscle spasm of back: Secondary | ICD-10-CM | POA: Insufficient documentation

## 2021-09-17 NOTE — Therapy (Signed)
OUTPATIENT PHYSICAL THERAPY TREATMENT   Patient Name: Monique Gonzalez MRN: [id number] DOB:Jun 22, 1955, 66 y.o., female Today's Date: 09/17/2021   PT End of Session - 09/17/21 1014     Visit Number 8    Number of Visits 13    Date for PT Re-Evaluation 09/24/21    Authorization Type Medicare & [employer]    PT Start Time 1014    PT Stop Time 1058    PT Time Calculation (min) 44 min    Activity Tolerance Patient tolerated treatment well    Behavior During Therapy [facility] for tasks assessed/performed                    Past Medical History:  Diagnosis Date   Allergy    Anxiety    Phreesia 06/04/2020   Arthritis    Phreesia 07/08/2020   Chronic pain    back and knee   Colitis 09/2012   infectious vs inflammatory.    Colon stricture [facility])    Depression    Depression    Phreesia 06/04/2020   Depression    Phreesia 07/08/2020   GERD (gastroesophageal reflux disease)    Phreesia 06/04/2020   Hyperlipidemia    Obesity    Osteoarthritis    Osteopenia    Recurrent cold sores    Seasonal allergies    Syncope and collapse 11/30/2012   Normal EEG.  Vaso vagal syncope   Past Surgical History:  Procedure Laterality Date   BIOPSY  02/21/2020   Procedure: BIOPSY;  Surgeon: Gatha Mayer, MD;  Location: [place];  Service: Endoscopy;;   COLON RESECTION N/A 10/14/2013   Procedure: LAPAROSCOPIC MOBILIZATION OF SPLENIC FLEXURE, LAPAROSCOPIC EXTENDED LEFT COLECTOMY;  Surgeon: [name], MD;  Location: [place];  Service: General;  Laterality: N/A;   COLON SURGERY N/A    Phreesia 06/04/2020   COLONOSCOPY N/A 10/12/2013   Procedure: COLONOSCOPY;  Surgeon: [name], MD;  Location: [place];  Service: Endoscopy;  Laterality: N/A;   COLONOSCOPY WITH PROPOFOL N/A 02/21/2020   Procedure: COLONOSCOPY WITH PROPOFOL;  Surgeon: Gatha Mayer, MD;  [facility] ENDOSCOPY;  Service: Endoscopy;  Laterality: N/A;   DILATION AND CURETTAGE, DIAGNOSTIC / THERAPEUTIC  1987    FRACTURE SURGERY N/A    Phreesia 06/04/2020   JOINT REPLACEMENT N/A    Phreesia 06/04/2020   OPEN REDUCTION INTERNAL FIXATION (ORIF) DISTAL RADIAL FRACTURE Right 08/08/2014   Procedure: OPEN REDUCTION INTERNAL FIXATION (ORIF) DISTAL RADIAL FRACTURE;  Surgeon: Roseanne Kaufman, MD;  Location: [place];  Service: Orthopedics;  Laterality: Right;  DVR Crosslock, MD available sometime around 1430-1500   ORIF ANKLE FRACTURE Right 11/10/2019   Procedure: POSSIBLEOPEN REDUCTION INTERNAL FIXATION (ORIF) ANKLE FRACTURE;  Surgeon: Shona Needles, MD;  Location: [place];  Service: Orthopedics;  Laterality: Right;   ORIF WRIST FRACTURE Left 11/10/2019   Procedure: OPEN REDUCTION INTERNAL FIXATION (ORIF) WRIST FRACTURE;  Surgeon: Shona Needles, MD;  Location: [place];  Service: Orthopedics;  Laterality: Left;   TONSILLECTOMY     TOTAL KNEE ARTHROPLASTY Right 08/05/2016   TOTAL KNEE ARTHROPLASTY Right 08/05/2016   Procedure: RIGHT TOTAL KNEE ARTHROPLASTY;  Surgeon: Mcarthur Rossetti, MD;  Location: [place];  Service: Orthopedics;  Laterality: Right;   Patient Active Problem List   Diagnosis Date Noted   Ischemic stricture intestine ([place])    Colitis 02/19/2020   Closed fracture of left distal radius 11/10/2019   Fall 11/10/2019   Closed right ankle  fracture 11/09/2019   Ankle fracture 11/09/2019   Closed displaced fracture of styloid process of left ulna    Anxiety and depression 12/22/2016   Insulin resistance 10/15/2016   Presence of right artificial knee joint 10/14/2016   Vitamin D deficiency 04/08/2016   Constipation 09/20/2013   Recurrent cold sores    Osteoarthritis    Depression    Hyperlipidemia     PCP: Susy Frizzle, MD  REFERRING PROVIDER: Consuella Lose, MD  REFERRING DIAG: [phone] (ICD-10-CM) - Spinal stenosis, lumbar region with neurogenic claudication  RATIONALE FOR EVALUATION AND TREATMENT: Rehabilitation  THERAPY DIAG:  Chronic bilateral low back pain with right-sided  sciatica  Muscle spasm of back  Cramp and spasm  Muscle weakness (generalized)  Difficulty in walking, not elsewhere classified  ONSET DATE: chronic  SUBJECTIVE:                                                                                                                                                                                           SUBJECTIVE STATEMENT: Pt reports she got a little over-zealous doing things around the house and flared up her radiculopathy temporarily but now settled back down. She notes overall improved activity tolerance with normal daily activities.  PAIN:  Are you having pain? No  PERTINENT HISTORY:  Fall with L wrist and R ankle fx 11/2019, R TKA 08/05/16, OA, osteopenia, anxiety, depression, syncope, HLD  PRECAUTIONS: None  WEIGHT BEARING RESTRICTIONS No  FALLS:  Has patient fallen in last 6 months? Yes. Number of falls 1 - syncopal episode while in [place]  LIVING ENVIRONMENT: Lives with: lives with their family and 2 sisters Lives in: House/apartment Stairs: Yes: Internal: 14 steps; on right going up and External: 1-2 steps; none Has following equipment at home: Single point cane, Grab bars, and chair lift for stairs  OCCUPATION: Retired  PLOF: Independent and Leisure: exercise - bike daily x ~30-60 min, would like to walk but limited by LBP  PATIENT GOALS: "To be able to walk for exercise w/o limitation d/t LBP."   OBJECTIVE:   DIAGNOSTIC FINDINGS:  06/22/21 - Lumbar MRI: 1. Multilevel lumbar spondylosis as described above, worst at L4-L5 where there is severe spinal canal and lateral recess stenosis.  2. Mild spinal canal stenosis at L2-L3 and L3-L4. Moderate right neuroforaminal stenosis at L3-L4.  PATIENT SURVEYS:  FOTO Lumbar = 41, predicted = 55  SCREENING FOR RED FLAGS: Bowel or bladder incontinence: No Spinal tumors: No Cauda equina syndrome: No Compression fracture: No Abdominal aneurysm: No  COGNITION:  Overall  cognitive status: Within functional limits for tasks assessed  SENSATION: WFL Except numbness at medial knee and anterior ankle  MUSCLE LENGTH: Hamstrings: mild tight B ITB: mild/mod tight R>L Piriformis: mod tight L>R Hip flexors: mild tight R>L Quads: mild tight R>L  POSTURE: increased lumbar lordosis  PALPATION: Increased muscle tension and TTP in B lower lumbar paraspinals, SIJ, glutes and piriformis, R>L. Hypomobile spinal mobility with increased pain in lower lumbar vertebrae and sacrum.  LUMBAR ROM:   Active  A/PROM  08/13/21  Flexion Fingertips to toes  Extension 25% limited  Right lateral flexion Hand to lower thigh - pain/tightness  Left lateral flexion Hand to lower thigh - pain/tightness  Right rotation [facility] - pain  Left rotation WFL - pain   (Blank rows = not tested)  LOWER EXTREMITY ROM:       B LE AROM grossly WFL/WNL  LOWER EXTREMITY MMT:    MMT Right 08/13/21 Left 08/13/21 Right 09/03/21 Left 09/03/21  Hip flexion 3+/5 4/5 4+ 4+  Hip extension 3-/5 3-/5 4-/5 4-/5  Hip abduction 3/5 3+/5 4-/5 4-/5  Hip adduction 3-/5 3/5 4-/5 4-/5  Hip internal rotation 4/5 4+/5 4+/5 4+/5  Hip external rotation 4-/5 4-/5 4-/5 4-/5  Knee flexion 4/5 4+/'5 5 5  ' Knee extension 4/5 4+/'5 5 5  ' Ankle dorsiflexion 4-/5 4+/5 5-/5 5  Ankle plantarflexion 3-/5  (limited lift with SLS heel raise) 4/5  (9 SLS heel raises)    Ankle inversion      Ankle eversion       (Blank rows = not tested)  LUMBAR SPECIAL TESTS:  Straight leg raise test: Positive on Rt   TODAY'S TREATMENT   09/17/21 THERAPEUTIC EXERCISE: to improve flexibility, strength and mobility.  Verbal and tactile cues throughout for technique. NuStep L5 x 6 min (UE/LE) Seated R/L figure-4 hip hinge piriformis stretch 2 x 30" Seated R/L KTOS piriformis stretch 2 x 30" Standing R/L lateral lean at wall ITB stretch 2 x 30" Standing B GTB rows 10 x 3", 2 sets - staggered stance with each foot fwd for 1 set, cues  for abdominal bracing Standing B GTB scap retraction/shoulder ext + TrA 10 x 3", 2 sets - staggered stance with each foot fwd for 1 set, cues for abdominal bracing Standing R/L GTB pallof press + short arc trunk rotation 10 x 3" Supported squat (UE support on counter) + triple extension 10 x 3" Supported squat (UE support on counter) + RTB hip ABD isometric 10 x 3" Standing B side-stepping with looped RTB at ankles 2 x 20 ft - cues to maintain slight knee flexion Standing fwd/back monster walk with looped RTB at ankles x 20 ft each direction - cues to maintain slight knee flexion   09/03/21 THERAPEUTIC EXERCISE: to improve flexibility, strength and mobility.  Verbal and tactile cues throughout for technique. NuStep L5 x 6 min Standing B GTB rows 10 x 3", 2 sets - staggered stance with each foot fwd for 1 set, cues for abdominal bracing Standing B GTB scap retraction/shoulder ext + TrA 10 x 3" - staggered stance with each foot fwd for 1 set, cues for abdominal bracing Standing R/L GTB pallof press + short arc trunk rotation 10 x 3" Standing B side-stepping with looped RTB at ankles 2 x 20 ft Standing fwd/back monster walk with looped RTB at ankles x 20 ft each direction Seated 3-way lumbar flexion stretch with UE support on green Pball 2 x 30 sec each direction Supported squat (UE support on TM rail) + RTB hip  ABD isometric 10 x 3" Standing B heel/toe raises with intermittent TM rail support 2 x 12   08/30/21 Therapeutic Exercise: Nustep L5x31mn Gait [id number] 90/90 hip flexion with unilateral lowering of legs 10 reps each Bridges with feet on BOSU 10 reps Trunk rotations feet straight on green pball 10 reps Straight leg bridge with green pball 10 reps Standing pallof press 10 reps bil GTB   PATIENT EDUCATION:  Education details:  HEP review and update/consolidation   Person educated: Patient Education method: ExConsulting civil engineerDemonstration, and Handouts Education comprehension:  verbalized understanding and returned demonstration   HOME EXERCISE PROGRAM: Access [facility]: [facility]RL: [email or web address] Date: 09/17/2021 Prepared by: JoAnnie ParasExercises - Standing Lumbar Extension with Counter  - 2 x daily - 7 x weekly - 2 sets - 10 reps - 3-5 sec hold - Standing ITB Stretch  - 2 x daily - 7 x weekly - 3 reps - 30 sec hold - Seated Flexion Stretch with Swiss Ball  - 2 x daily - 7 x weekly - 2 sets - 2 reps - 30 sec hold - Seated Figure 4 Piriformis Stretch  - 2 x daily - 7 x weekly - 3 reps - 30 sec hold - Seated Piriformis Stretch  - 2 x daily - 7 x weekly - 3 reps - 30 sec hold - Supine Posterior Pelvic Tilt  - 1 x daily - 7 x weekly - 2-3 sets - 10 reps - 3 sec hold - Supine March with Resistance Band  - 1 x daily - 3 x weekly - 2 sets - 10 reps - 2-3 sec hold hold - Hooklying Single Leg Bent Knee Fallouts with Resistance  - 1 x daily - 3 x weekly - 2 sets - 10 reps - 3 sec hold - Supine Bridge with Mini Swiss Ball Between Knees  - 1 x daily - 3 x weekly - 2 sets - 10 reps - 5 sec hold - Standing Shoulder Row with Anchored Resistance  - 1 x daily - 3 x weekly - 2 sets - 10 reps - 5 sec hold - Scapular Retraction with Resistance Advanced  - 1 x daily - 3 x weekly - 2 sets - 10 reps - 5 sec hold - Standing Trunk Rotation with Resistance  - 1 x daily - 3 x weekly - 2 sets - 10 reps - 3 sec hold - Isometric Squat with Resistance Loop  - 1 x daily - 3 x weekly - 2 sets - 10 reps - 3 sec hold - Heel Toe Raises with Counter Support  - 1 x daily - 3 x weekly - 2 sets - 10 reps - 3 sec hold - Side Stepping with Resistance at Feet  - 1 x daily - 3 x weekly - 2 sets - 10 reps - Band Walks  - 1 x daily - 3 x weekly - 2 sets - 10 reps   ASSESSMENT:  CLINICAL IMPRESSION: [place] increased activity tolerance for normal daily tasks, only once recently overdoing things to the point where she temporarily flared up her radiculopathy. She is nearing the end  of her current POC and feels that she will be ready to transition to her HEP at that time, therefore today's session focusing on review, update and consolidation of her HEP. Several stretches and exercises converted to more upright positions for ease/convenience of performance at home. Cues necessary to maintain slight knee flexion during resisted stepping activities. All exercises well  tolerated. Updated/revised HEP reprinted in it entirety for home reference and will plan to address any concerns regarding the changes as needed next visit as well as provide education on/review of proper posture and body mechanics for typical daily tasks to reduce the risk for further irritation of her low back or lumbar radiculopathy.  OBJECTIVE IMPAIRMENTS decreased activity tolerance, decreased balance, decreased endurance, decreased knowledge of condition, decreased mobility, difficulty walking, decreased ROM, decreased strength, hypomobility, increased fascial restrictions, impaired perceived functional ability, increased muscle spasms, impaired flexibility, impaired sensation, improper body mechanics, postural dysfunction, and pain.   ACTIVITY LIMITATIONS sitting, standing, stairs, and locomotion level  PARTICIPATION LIMITATIONS: meal prep, cleaning, laundry, shopping, community activity, yard work, and exercising for weight loss  PERSONAL FACTORS Past/current experiences, Time since onset of injury/illness/exacerbation, and 3+ comorbidities: Fall with L wrist and R ankle fx 11/2019, R TKA 08/05/16, OA, osteopenia, anxiety, depression, syncope, HLD  are also affecting patient's functional outcome.   REHAB POTENTIAL: Good  CLINICAL DECISION MAKING: Evolving/moderate complexity  EVALUATION COMPLEXITY: Moderate   GOALS: Goals reviewed with patient? Yes  SHORT TERM GOALS: Target date: 09/03/2021   Patient will be independent with initial HEP.  Baseline:  Goal status: MET  08/23/21  2.  Patient will report  centralization of radicular symptoms.  Baseline:  Goal status: MET  08/20/21 - Pt reports no radicular symptoms since the ESI   LONG TERM GOALS: Target date: 09/24/2021    Patient will be independent with advanced/ongoing HEP to improve outcomes and carryover.  Baseline:  Goal status: IN PROGRESS  2.  Patient will report 75% improvement in low back & R LE radicular pain to improve QOL.  Baseline:  Goal status: MET  08/30/21 - pt reports 100% improvement   3.  Patient to demonstrate ability to achieve and maintain good spinal alignment/posturing and body mechanics needed for daily activities. Baseline:  Goal status: IN PROGRESS  4.  Patient will demonstrate full pain free lumbar ROM to perform ADLs.   Baseline:  Goal status: IN PROGRESS  5.  Patient will demonstrate improved B LE strength to >/= 4 to 4+/5 for improved stability and ease of mobility . Baseline:  Goal status: IN PROGRESS  09/03/21 - Partially met (refer to above MMT chart)  6.  Patient will report 26 on lumbar FOTO to demonstrate improved functional ability.  Baseline: 41 Goal status: IN PROGRESS   7.  Patient will tolerate >/= 30 min of standing and sitting to perform household chores with decreased pain interference. Baseline:  Goal status: IN PROGRESS  8.  Patient will improve walking tolerance to >/= 30 minutes w/o pain interference to allow improved tolerance for shopping or walking for exercise. Baseline:  Goal status: IN PROGRESS  09/03/21 - Partially met - able to walk through grocery store w/o increased pain or need to lean on shopping cart    PLAN: PT FREQUENCY: 2x/week  PT DURATION: 6 weeks  PLANNED INTERVENTIONS: Therapeutic exercises, Therapeutic activity, Neuromuscular re-education, Balance training, Gait training, Patient/Family education, Joint mobilization, Stair training, Aquatic Therapy, Dry Needling, Electrical stimulation, Spinal mobilization, Cryotherapy, Moist heat, Taping, Traction,  Ultrasound, Ionotophoresis 66m/ml Dexamethasone, Manual therapy, and Re-evaluation.  PLAN FOR NEXT SESSION: posture and body mechanics education; progress lumbopelvic/LE stretching and strengthening in multiple positions - review/update HEP as indicated; initiate LTG assessment as time allows   JPercival Spanish PT 09/17/2021, 12:12 PM

## 2021-09-20 ENCOUNTER — Ambulatory Visit: Payer: Medicare Other

## 2021-09-20 DIAGNOSIS — R252 Cramp and spasm: Secondary | ICD-10-CM

## 2021-09-20 DIAGNOSIS — G8929 Other chronic pain: Secondary | ICD-10-CM

## 2021-09-20 DIAGNOSIS — M5441 Lumbago with sciatica, right side: Secondary | ICD-10-CM | POA: Diagnosis not present

## 2021-09-20 DIAGNOSIS — R262 Difficulty in walking, not elsewhere classified: Secondary | ICD-10-CM

## 2021-09-20 DIAGNOSIS — M6281 Muscle weakness (generalized): Secondary | ICD-10-CM

## 2021-09-20 DIAGNOSIS — M62830 Muscle spasm of back: Secondary | ICD-10-CM

## 2021-09-20 NOTE — Therapy (Signed)
OUTPATIENT PHYSICAL THERAPY TREATMENT   Patient Name: Monique Gonzalez MRN: [id number] DOB:[birth year], 66 y.o., female Today's Date: 09/20/2021   PT End of Session - 09/20/21 1107     Visit Number 9    Number of Visits 13    Date for PT Re-Evaluation 09/24/21    Authorization Type Medicare & [employer]    PT Start Time 1016    PT Stop Time 1059    PT Time Calculation (min) 43 min    Activity Tolerance Patient tolerated treatment well    Behavior During Therapy [facility] for tasks assessed/performed                     Past Medical History:  Diagnosis Date   Allergy    Anxiety    Phreesia 06/04/2020   Arthritis    Phreesia 07/08/2020   Chronic pain    back and knee   Colitis 09/2012   infectious vs inflammatory.    Colon stricture [facility])    Depression    Depression    Phreesia 06/04/2020   Depression    Phreesia 07/08/2020   GERD (gastroesophageal reflux disease)    Phreesia 06/04/2020   Hyperlipidemia    Obesity    Osteoarthritis    Osteopenia    Recurrent cold sores    Seasonal allergies    Syncope and collapse 11/30/2012   Normal EEG.  Vaso vagal syncope   Past Surgical History:  Procedure Laterality Date   BIOPSY  02/21/2020   Procedure: BIOPSY;  Surgeon: Gatha Mayer, MD;  Location: [place];  Service: Endoscopy;;   COLON RESECTION N/A 10/14/2013   Procedure: LAPAROSCOPIC MOBILIZATION OF SPLENIC FLEXURE, LAPAROSCOPIC EXTENDED LEFT COLECTOMY;  Surgeon: [name], MD;  Location: [place];  Service: General;  Laterality: N/A;   COLON SURGERY N/A    Phreesia 06/04/2020   COLONOSCOPY N/A 10/12/2013   Procedure: COLONOSCOPY;  Surgeon: [name], MD;  Location: [place];  Service: Endoscopy;  Laterality: N/A;   COLONOSCOPY WITH PROPOFOL N/A 02/21/2020   Procedure: COLONOSCOPY WITH PROPOFOL;  Surgeon: Gatha Mayer, MD;  Location: [facility] ENDOSCOPY;  Service: Endoscopy;  Laterality: N/A;   DILATION AND CURETTAGE, DIAGNOSTIC / THERAPEUTIC  1987    FRACTURE SURGERY N/A    Phreesia 06/04/2020   JOINT REPLACEMENT N/A    Phreesia 06/04/2020   OPEN REDUCTION INTERNAL FIXATION (ORIF) DISTAL RADIAL FRACTURE Right 08/08/2014   Procedure: OPEN REDUCTION INTERNAL FIXATION (ORIF) DISTAL RADIAL FRACTURE;  Surgeon: Roseanne Kaufman, MD;  Location: [place];  Service: Orthopedics;  Laterality: Right;  DVR Crosslock, MD available sometime around 1430-1500   ORIF ANKLE FRACTURE Right 11/10/2019   Procedure: POSSIBLEOPEN REDUCTION INTERNAL FIXATION (ORIF) ANKLE FRACTURE;  Surgeon: Shona Needles, MD;  Location: [place];  Service: Orthopedics;  Laterality: Right;   ORIF WRIST FRACTURE Left 11/10/2019   Procedure: OPEN REDUCTION INTERNAL FIXATION (ORIF) WRIST FRACTURE;  Surgeon: Shona Needles, MD;  Location: [place];  Service: Orthopedics;  Laterality: Left;   TONSILLECTOMY     TOTAL KNEE ARTHROPLASTY Right 08/05/2016   TOTAL KNEE ARTHROPLASTY Right 08/05/2016   Procedure: RIGHT TOTAL KNEE ARTHROPLASTY;  Surgeon: Mcarthur Rossetti, MD;  Location: [place];  Service: Orthopedics;  Laterality: Right;   Patient Active Problem List   Diagnosis Date Noted   Ischemic stricture intestine ([place])    Colitis 02/19/2020   Closed fracture of left distal radius 11/10/2019   Fall 11/10/2019   Closed right  ankle fracture 11/09/2019   Ankle fracture 11/09/2019   Closed displaced fracture of styloid process of left ulna    Anxiety and depression 12/22/2016   Insulin resistance 10/15/2016   Presence of right artificial knee joint 10/14/2016   Vitamin D deficiency 04/08/2016   Constipation 09/20/2013   Recurrent cold sores    Osteoarthritis    Depression    Hyperlipidemia     PCP: Susy Frizzle, MD  REFERRING PROVIDER: Consuella Lose, MD  REFERRING DIAG: [phone] (ICD-10-CM) - Spinal stenosis, lumbar region with neurogenic claudication  RATIONALE FOR EVALUATION AND TREATMENT: Rehabilitation  THERAPY DIAG:  Chronic bilateral low back pain with right-sided  sciatica  Muscle spasm of back  Cramp and spasm  Muscle weakness (generalized)  Difficulty in walking, not elsewhere classified  ONSET DATE: chronic  SUBJECTIVE:                                                                                                                                                                                           SUBJECTIVE STATEMENT: Still a little flared up because we have been doing a lot around the house.  PAIN:  Are you having pain? 1/10  PERTINENT HISTORY:  Fall with L wrist and R ankle fx 11/2019, R TKA 08/05/16, OA, osteopenia, anxiety, depression, syncope, HLD  PRECAUTIONS: None  WEIGHT BEARING RESTRICTIONS No  FALLS:  Has patient fallen in last 6 months? Yes. Number of falls 1 - syncopal episode while in [place]  LIVING ENVIRONMENT: Lives with: lives with their family and 2 sisters Lives in: House/apartment Stairs: Yes: Internal: 14 steps; on right going up and External: 1-2 steps; none Has following equipment at home: Single point cane, Grab bars, and chair lift for stairs  OCCUPATION: Retired  PLOF: Independent and Leisure: exercise - bike daily x ~30-60 min, would like to walk but limited by LBP  PATIENT GOALS: "To be able to walk for exercise w/o limitation d/t LBP."   OBJECTIVE:   DIAGNOSTIC FINDINGS:  06/22/21 - Lumbar MRI: 1. Multilevel lumbar spondylosis as described above, worst at L4-L5 where there is severe spinal canal and lateral recess stenosis.  2. Mild spinal canal stenosis at L2-L3 and L3-L4. Moderate right neuroforaminal stenosis at L3-L4.  PATIENT SURVEYS:  FOTO Lumbar = 41, predicted = 55  SCREENING FOR RED FLAGS: Bowel or bladder incontinence: No Spinal tumors: No Cauda equina syndrome: No Compression fracture: No Abdominal aneurysm: No  COGNITION:  Overall cognitive status: Within functional limits for tasks assessed     SENSATION: WFL Except numbness at medial knee and anterior ankle  MUSCLE  LENGTH: Hamstrings: mild tight B  ITB: mild/mod tight R>L Piriformis: mod tight L>R Hip flexors: mild tight R>L Quads: mild tight R>L  POSTURE: increased lumbar lordosis  PALPATION: Increased muscle tension and TTP in B lower lumbar paraspinals, SIJ, glutes and piriformis, R>L. Hypomobile spinal mobility with increased pain in lower lumbar vertebrae and sacrum.  LUMBAR ROM:   Active  A/PROM  08/13/21 AROM 09/20/21  Flexion Fingertips to toes full  Extension 25% limited full  Right lateral flexion Hand to lower thigh - pain/tightness To knee  Left lateral flexion Hand to lower thigh - pain/tightness To knee  Right rotation [facility] - pain full  Left rotation WFL - pain full   (Blank rows = not tested)  LOWER EXTREMITY ROM:       B LE AROM grossly WFL/WNL  LOWER EXTREMITY MMT:    MMT Right 08/13/21 Left 08/13/21 Right 09/03/21 Left 09/03/21  Hip flexion 3+/5 4/5 4+ 4+  Hip extension 3-/5 3-/5 4-/5 4-/5  Hip abduction 3/5 3+/5 4-/5 4-/5  Hip adduction 3-/5 3/5 4-/5 4-/5  Hip internal rotation 4/5 4+/5 4+/5 4+/5  Hip external rotation 4-/5 4-/5 4-/5 4-/5  Knee flexion 4/5 4+/_0 Knee extension 4/5 4+/_1 Ankle dorsiflexion 4-/5 4+/5 5-/5 5  Ankle plantarflexion 3-/5  (limited lift with SLS heel raise) 4/5  (9 SLS heel raises)    Ankle inversion      Ankle eversion       (Blank rows = not tested)  LUMBAR SPECIAL TESTS:  Straight leg raise test: Positive on Rt   TODAY'S TREATMENT  09/20/21 Therapeutic Exercise: Nustep [id number] Seated figure 4 stretch 2x30 R/L Seated KTOS 2x30" R/L Seated 3 way green pball rollout 2x30" each Seated deadlift with yellow weighted ball 12x Seated lumbar flexion + rotation reaching to opp foot 10x Standing multifidus walkout with GTB and shoulders flexed 10x R/L  09/17/21 THERAPEUTIC EXERCISE: to improve flexibility, strength and mobility.  Verbal and tactile cues throughout for technique. NuStep L5 x 6 min (UE/LE) Seated R/L figure-4  hip hinge piriformis stretch 2 x 30" Seated R/L KTOS piriformis stretch 2 x 30" Standing R/L lateral lean at wall ITB stretch 2 x 30" Standing B GTB rows 10 x 3", 2 sets - staggered stance with each foot fwd for 1 set, cues for abdominal bracing Standing B GTB scap retraction/shoulder ext + TrA 10 x 3", 2 sets - staggered stance with each foot fwd for 1 set, cues for abdominal bracing Standing R/L GTB pallof press + short arc trunk rotation 10 x 3" Supported squat (UE support on counter) + triple extension 10 x 3" Supported squat (UE support on counter) + RTB hip ABD isometric 10 x 3" Standing B side-stepping with looped RTB at ankles 2 x 20 ft - cues to maintain slight knee flexion Standing fwd/back monster walk with looped RTB at ankles x 20 ft each direction - cues to maintain slight knee flexion   09/03/21 THERAPEUTIC EXERCISE: to improve flexibility, strength and mobility.  Verbal and tactile cues throughout for technique. NuStep L5 x 6 min Standing B GTB rows 10 x 3", 2 sets - staggered stance with each foot fwd for 1 set, cues for abdominal bracing Standing B GTB scap retraction/shoulder ext + TrA 10 x 3" - staggered stance with each foot fwd for 1 set, cues for abdominal bracing Standing R/L GTB pallof press + short arc trunk rotation 10 x 3" Standing B side-stepping with looped RTB at ankles 2 x  20 ft Standing fwd/back monster walk with looped RTB at ankles x 20 ft each direction Seated 3-way lumbar flexion stretch with UE support on green Pball 2 x 30 sec each direction Supported squat (UE support on TM rail) + RTB hip ABD isometric 10 x 3" Standing B heel/toe raises with intermittent TM rail support 2 x 12  PATIENT EDUCATION:  Education details:  HEP review and update/consolidation   Person educated: Patient Education method: Explanation, Demonstration, and Handouts Education comprehension: verbalized understanding and returned demonstration   HOME EXERCISE PROGRAM: Access  Code: [facility] URL: [email or web address] Date: 09/17/2021 Prepared by: Annie Paras  Exercises - Standing Lumbar Extension with Counter  - 2 x daily - 7 x weekly - 2 sets - 10 reps - 3-5 sec hold - Standing ITB Stretch  - 2 x daily - 7 x weekly - 3 reps - 30 sec hold - Seated Flexion Stretch with Swiss Ball  - 2 x daily - 7 x weekly - 2 sets - 2 reps - 30 sec hold - Seated Figure 4 Piriformis Stretch  - 2 x daily - 7 x weekly - 3 reps - 30 sec hold - Seated Piriformis Stretch  - 2 x daily - 7 x weekly - 3 reps - 30 sec hold - Supine Posterior Pelvic Tilt  - 1 x daily - 7 x weekly - 2-3 sets - 10 reps - 3 sec hold - Supine March with Resistance Band  - 1 x daily - 3 x weekly - 2 sets - 10 reps - 2-3 sec hold hold - Hooklying Single Leg Bent Knee Fallouts with Resistance  - 1 x daily - 3 x weekly - 2 sets - 10 reps - 3 sec hold - Supine Bridge with Mini Swiss Ball Between Knees  - 1 x daily - 3 x weekly - 2 sets - 10 reps - 5 sec hold - Standing Shoulder Row with Anchored Resistance  - 1 x daily - 3 x weekly - 2 sets - 10 reps - 5 sec hold - Scapular Retraction with Resistance Advanced  - 1 x daily - 3 x weekly - 2 sets - 10 reps - 5 sec hold - Standing Trunk Rotation with Resistance  - 1 x daily - 3 x weekly - 2 sets - 10 reps - 3 sec hold - Isometric Squat with Resistance Loop  - 1 x daily - 3 x weekly - 2 sets - 10 reps - 3 sec hold - Heel Toe Raises with Counter Support  - 1 x daily - 3 x weekly - 2 sets - 10 reps - 3 sec hold - Side Stepping with Resistance at Feet  - 1 x daily - 3 x weekly - 2 sets - 10 reps - Band Walks  - 1 x daily - 3 x weekly - 2 sets - 10 reps   ASSESSMENT:  CLINICAL IMPRESSION: Pt had met lumbar ROM goal today. She was still mildly flared up from increased activity at home so we focused on stretching and mobility exercises today. Instruction was provided throughout session to provided clarification as needed and to correct form. Towards the end she  noted a good resistance in her low back with the multifidus walkouts. She is doing well and may be ready to transition to HEP next visit.  OBJECTIVE IMPAIRMENTS decreased activity tolerance, decreased balance, decreased endurance, decreased knowledge of condition, decreased mobility, difficulty walking, decreased ROM, decreased strength, hypomobility, increased fascial restrictions, impaired perceived  functional ability, increased muscle spasms, impaired flexibility, impaired sensation, improper body mechanics, postural dysfunction, and pain.   ACTIVITY LIMITATIONS sitting, standing, stairs, and locomotion level  PARTICIPATION LIMITATIONS: meal prep, cleaning, laundry, shopping, community activity, yard work, and exercising for weight loss  PERSONAL FACTORS Past/current experiences, Time since onset of injury/illness/exacerbation, and 3+ comorbidities: Fall with L wrist and R ankle fx 11/2019, R TKA 08/05/16, OA, osteopenia, anxiety, depression, syncope, HLD  are also affecting patient's functional outcome.   REHAB POTENTIAL: Good  CLINICAL DECISION MAKING: Evolving/moderate complexity  EVALUATION COMPLEXITY: Moderate   GOALS: Goals reviewed with patient? Yes  SHORT TERM GOALS: Target date: 09/03/2021   Patient will be independent with initial HEP.  Baseline:  Goal status: MET  08/23/21  2.  Patient will report centralization of radicular symptoms.  Baseline:  Goal status: MET  08/20/21 - Pt reports no radicular symptoms since the ESI   LONG TERM GOALS: Target date: 09/24/2021    Patient will be independent with advanced/ongoing HEP to improve outcomes and carryover.  Baseline:  Goal status: IN PROGRESS  2.  Patient will report 75% improvement in low back & R LE radicular pain to improve QOL.  Baseline:  Goal status: MET  08/30/21 - pt reports 100% improvement   3.  Patient to demonstrate ability to achieve and maintain good spinal alignment/posturing and body mechanics needed for  daily activities. Baseline:  Goal status: IN PROGRESS  4.  Patient will demonstrate full pain free lumbar ROM to perform ADLs.   Baseline:  Goal status: MET (09/20/21)  5.  Patient will demonstrate improved B LE strength to >/= 4 to 4+/5 for improved stability and ease of mobility . Baseline:  Goal status: IN PROGRESS  09/03/21 - Partially met (refer to above MMT chart)  6.  Patient will report 47 on lumbar FOTO to demonstrate improved functional ability.  Baseline: 41 Goal status: IN PROGRESS   7.  Patient will tolerate >/= 30 min of standing and sitting to perform household chores with decreased pain interference. Baseline:  Goal status: IN PROGRESS  8.  Patient will improve walking tolerance to >/= 30 minutes w/o pain interference to allow improved tolerance for shopping or walking for exercise. Baseline:  Goal status: IN PROGRESS  09/03/21 - Partially met - able to walk through grocery store w/o increased pain or need to lean on shopping cart    PLAN: PT FREQUENCY: 2x/week  PT DURATION: 6 weeks  PLANNED INTERVENTIONS: Therapeutic exercises, Therapeutic activity, Neuromuscular re-education, Balance training, Gait training, Patient/Family education, Joint mobilization, Stair training, Aquatic Therapy, Dry Needling, Electrical stimulation, Spinal mobilization, Cryotherapy, Moist heat, Taping, Traction, Ultrasound, Ionotophoresis 98m/ml Dexamethasone, Manual therapy, and Re-evaluation.  PLAN FOR NEXT SESSION: posture and body mechanics education; progress lumbopelvic/LE stretching and strengthening in multiple positions - review/update HEP as indicated; initiate LTG assessment as time allows   BArtist Pais PTA 09/20/2021, 11:07 AM

## 2021-09-24 ENCOUNTER — Ambulatory Visit: Payer: Medicare Other | Admitting: Physical Therapy

## 2021-09-24 ENCOUNTER — Encounter: Payer: Self-pay | Admitting: Physical Therapy

## 2021-09-24 DIAGNOSIS — R262 Difficulty in walking, not elsewhere classified: Secondary | ICD-10-CM

## 2021-09-24 DIAGNOSIS — M6281 Muscle weakness (generalized): Secondary | ICD-10-CM

## 2021-09-24 DIAGNOSIS — G8929 Other chronic pain: Secondary | ICD-10-CM

## 2021-09-24 DIAGNOSIS — M5441 Lumbago with sciatica, right side: Secondary | ICD-10-CM | POA: Diagnosis not present

## 2021-09-24 DIAGNOSIS — M62830 Muscle spasm of back: Secondary | ICD-10-CM

## 2021-09-24 DIAGNOSIS — R252 Cramp and spasm: Secondary | ICD-10-CM

## 2021-09-24 NOTE — Therapy (Addendum)
OUTPATIENT PHYSICAL THERAPY TREATMENT / PROGRESS NOTE / DISCHARGE SUMMARY   Patient Name: Monique Gonzalez MRN: [id number] DOB:[birth year], 66 y.o., female Today's Date: 09/24/2021  Progress Note  Reporting Period 08/13/2021 to 09/24/2021  See note below for Objective Data and Assessment of Progress/Goals.      PT End of Session - 09/24/21 1015     Visit Number 10    Number of Visits 13    Date for PT Re-Evaluation 09/24/21    Authorization Type Medicare & [employer]    PT Start Time 1015    PT Stop Time 1102    PT Time Calculation (min) 47 min    Activity Tolerance Patient tolerated treatment well    Behavior During Therapy [facility] for tasks assessed/performed                      Past Medical History:  Diagnosis Date   Allergy    Anxiety    Phreesia 06/04/2020   Arthritis    Phreesia 07/08/2020   Chronic pain    back and knee   Colitis 09/2012   infectious vs inflammatory.    Colon stricture [facility])    Depression    Depression    Phreesia 06/04/2020   Depression    Phreesia 07/08/2020   GERD (gastroesophageal reflux disease)    Phreesia 06/04/2020   Hyperlipidemia    Obesity    Osteoarthritis    Osteopenia    Recurrent cold sores    Seasonal allergies    Syncope and collapse 11/30/2012   Normal EEG.  Vaso vagal syncope   Past Surgical History:  Procedure Laterality Date   BIOPSY  02/21/2020   Procedure: BIOPSY;  Surgeon: Gatha Mayer, MD;  Location: [place];  Service: Endoscopy;;   COLON RESECTION N/A 10/14/2013   Procedure: LAPAROSCOPIC MOBILIZATION OF SPLENIC FLEXURE, LAPAROSCOPIC EXTENDED LEFT COLECTOMY;  Surgeon: [name], MD;  Location: [place];  Service: General;  Laterality: N/A;   COLON SURGERY N/A    Phreesia 06/04/2020   COLONOSCOPY N/A 10/12/2013   Procedure: COLONOSCOPY;  Surgeon: [name], MD;  Location: [place];  Service: Endoscopy;  Laterality: N/A;   COLONOSCOPY WITH PROPOFOL N/A 02/21/2020   Procedure:  COLONOSCOPY WITH PROPOFOL;  Surgeon: Gatha Mayer, MD;  Location: [facility] ENDOSCOPY;  Service: Endoscopy;  Laterality: N/A;   DILATION AND CURETTAGE, DIAGNOSTIC / THERAPEUTIC  1987   FRACTURE SURGERY N/A    Phreesia 06/04/2020   JOINT REPLACEMENT N/A    Phreesia 06/04/2020   OPEN REDUCTION INTERNAL FIXATION (ORIF) DISTAL RADIAL FRACTURE Right 08/08/2014   Procedure: OPEN REDUCTION INTERNAL FIXATION (ORIF) DISTAL RADIAL FRACTURE;  Surgeon: Roseanne Kaufman, MD;  Location: [place];  Service: Orthopedics;  Laterality: Right;  DVR Crosslock, MD available sometime around 1430-1500   ORIF ANKLE FRACTURE Right 11/10/2019   Procedure: POSSIBLEOPEN REDUCTION INTERNAL FIXATION (ORIF) ANKLE FRACTURE;  Surgeon: Shona Needles, MD;  Location: [place];  Service: Orthopedics;  Laterality: Right;   ORIF WRIST FRACTURE Left 11/10/2019   Procedure: OPEN REDUCTION INTERNAL FIXATION (ORIF) WRIST FRACTURE;  Surgeon: Shona Needles, MD;  Location: [place];  Service: Orthopedics;  Laterality: Left;   TONSILLECTOMY     TOTAL KNEE ARTHROPLASTY Right 08/05/2016   TOTAL KNEE ARTHROPLASTY Right 08/05/2016   Procedure: RIGHT TOTAL KNEE ARTHROPLASTY;  Surgeon: Mcarthur Rossetti, MD;  Location: [place];  Service: Orthopedics;  Laterality: Right;   Patient Active Problem List   Diagnosis  Date Noted   Ischemic stricture intestine ([place])    Colitis 02/19/2020   Closed fracture of left distal radius 11/10/2019   Fall 11/10/2019   Closed right ankle fracture 11/09/2019   Ankle fracture 11/09/2019   Closed displaced fracture of styloid process of left ulna    Anxiety and depression 12/22/2016   Insulin resistance 10/15/2016   Presence of right artificial knee joint 10/14/2016   Vitamin D deficiency 04/08/2016   Constipation 09/20/2013   Recurrent cold sores    Osteoarthritis    Depression    Hyperlipidemia     PCP: Susy Frizzle, MD  REFERRING PROVIDER: Consuella Lose, MD  REFERRING DIAG: [phone] (ICD-10-CM) -  Spinal stenosis, lumbar region with neurogenic claudication  RATIONALE FOR EVALUATION AND TREATMENT: Rehabilitation  THERAPY DIAG:  Chronic bilateral low back pain with right-sided sciatica  Muscle spasm of back  Cramp and spasm  Muscle weakness (generalized)  Difficulty in walking, not elsewhere classified  ONSET DATE: chronic  SUBJECTIVE:                                                                                                                                                                                           SUBJECTIVE STATEMENT: Pt reports she feels like if she keeps up the regime she has she will be fine to keep going on her own. She denies LBP or sciatic shooting pain in legs but does still note some numbness her her feet - constant on R, but intermittent on L.  PAIN:  Are you having pain? No  PERTINENT HISTORY:  Fall with L wrist and R ankle fx 11/2019, R TKA 08/05/16, OA, osteopenia, anxiety, depression, syncope, HLD  PRECAUTIONS: None  WEIGHT BEARING RESTRICTIONS No  FALLS:  Has patient fallen in last 6 months? Yes. Number of falls 1 - syncopal episode while in [place]  LIVING ENVIRONMENT: Lives with: lives with their family and 2 sisters Lives in: House/apartment Stairs: Yes: Internal: 14 steps; on right going up and External: 1-2 steps; none Has following equipment at home: Single point cane, Grab bars, and chair lift for stairs  OCCUPATION: Retired  PLOF: Independent and Leisure: exercise - bike daily x ~30-60 min, would like to walk but limited by LBP  PATIENT GOALS: "To be able to walk for exercise w/o limitation d/t LBP."   OBJECTIVE:   DIAGNOSTIC FINDINGS:  06/22/21 - Lumbar MRI: 1. Multilevel lumbar spondylosis as described above, worst at L4-L5 where there is severe spinal canal and lateral recess stenosis.  2. Mild spinal canal stenosis at L2-L3 and L3-L4. Moderate right neuroforaminal stenosis at L3-L4.  PATIENT SURVEYS:  FOTO Lumbar = 41,  predicted = 55  SCREENING FOR RED FLAGS: Bowel or bladder incontinence: No Spinal tumors: No Cauda equina syndrome: No Compression fracture: No Abdominal aneurysm: No  COGNITION:  Overall cognitive status: Within functional limits for tasks assessed     SENSATION: WFL Except numbness at medial knee and anterior ankle  MUSCLE LENGTH: Hamstrings: mild tight B ITB: mild/mod tight R>L Piriformis: mod tight L>R Hip flexors: mild tight R>L Quads: mild tight R>L  POSTURE: increased lumbar lordosis  PALPATION: Increased muscle tension and TTP in B lower lumbar paraspinals, SIJ, glutes and piriformis, R>L. Hypomobile spinal mobility with increased pain in lower lumbar vertebrae and sacrum.  LUMBAR ROM:   Active  A/PROM  08/13/21 AROM 09/20/21  Flexion Fingertips to toes full  Extension 25% limited full  Right lateral flexion Hand to lower thigh - pain/tightness To knee  Left lateral flexion Hand to lower thigh - pain/tightness To knee  Right rotation [facility] - pain full  Left rotation WFL - pain full   (Blank rows = not tested)  LOWER EXTREMITY ROM:       B LE AROM grossly WFL/WNL  LOWER EXTREMITY MMT:    MMT Right 08/13/21 Left 08/13/21 Right 09/03/21 Left 09/03/21 Right  09/24/21 Left 09/24/21  Hip flexion 3+/5 4/5 4+ 4+ 5 5  Hip extension 3-/5 3-/5 4-/5 4-/5 4+ 4+  Hip abduction 3/5 3+/5 4-/5 4-/5 4 4+  Hip adduction 3-/5 3/5 4-/5 4-/5 4+ 4+  Hip internal rotation 4/5 4+/5 4+/5 4+/'5 5 5  ' Hip external rotation 4-/5 4-/5 4-/5 4-/5 4+ 4+  Knee flexion 4/5 4+/'5 5 5 5 5  ' Knee extension 4/5 4+/'5 5 5 5 5  ' Ankle dorsiflexion 4-/5 4+/5 5-/'5 5 5 5  ' Ankle plantarflexion 3-/5  (limited lift with SLS heel raise) 4/5  (9 SLS heel raises)   4/5  (10 SLS heel raises) 4+ (14 SLS heel raises)  Ankle inversion        Ankle eversion         (Blank rows = not tested)  LUMBAR SPECIAL TESTS:  Straight leg raise test: Positive on Rt   TODAY'S TREATMENT    09/17/21 THERAPEUTIC  EXERCISE: to improve flexibility, strength and mobility.  Verbal and tactile cues throughout for technique. NuStep L6 x 6 min (UE/LE)  SELF CARE: Provided education on proper posture and body mechanics for typical daily positioning and household tasks   09/20/21 Therapeutic Exercise: Nustep L6x1mn Seated figure 4 stretch 2x30 R/L Seated KTOS 2x30" R/L Seated 3 way green pball rollout 2x30" each Seated deadlift with yellow weighted ball 12x Seated lumbar flexion + rotation reaching to opp foot 10x Standing multifidus walkout with GTB and shoulders flexed 10x R/L   09/17/21 THERAPEUTIC EXERCISE: to improve flexibility, strength and mobility.  Verbal and tactile cues throughout for technique. NuStep L5 x 6 min (UE/LE) Seated R/L figure-4 hip hinge piriformis stretch 2 x 30" Seated R/L KTOS piriformis stretch 2 x 30" Standing R/L lateral lean at wall ITB stretch 2 x 30" Standing B GTB rows 10 x 3", 2 sets - staggered stance with each foot fwd for 1 set, cues for abdominal bracing Standing B GTB scap retraction/shoulder ext + TrA 10 x 3", 2 sets - staggered stance with each foot fwd for 1 set, cues for abdominal bracing Standing R/L GTB pallof press + short arc trunk rotation 10 x 3" Supported squat (UE support on counter) + triple extension 10 x 3" Supported  squat (UE support on counter) + RTB hip ABD isometric 10 x 3" Standing B side-stepping with looped RTB at ankles 2 x 20 ft - cues to maintain slight knee flexion Standing fwd/back monster walk with looped RTB at ankles x 20 ft each direction - cues to maintain slight knee flexion   PATIENT EDUCATION:  Education details: recommended frequency for ongoing HEP at discharge to prevent loss of gains achieved with PT and posture and body mechanics for typical daily postioning, mobility and household tasks  Person educated: Patient Education method: Explanation, Demonstration, and Handouts Education comprehension: verbalized  understanding   HOME EXERCISE PROGRAM: Access Code: [facility] URL: [email or web address] Date: 09/24/2021 Prepared by: Annie Paras  Exercises - Standing Lumbar Extension with Counter  - 2 x daily - 7 x weekly - 2 sets - 10 reps - 3-5 sec hold - Standing ITB Stretch  - 2 x daily - 7 x weekly - 3 reps - 30 sec hold - Seated Flexion Stretch with Swiss Ball  - 2 x daily - 7 x weekly - 2 sets - 2 reps - 30 sec hold - Seated Figure 4 Piriformis Stretch  - 2 x daily - 7 x weekly - 3 reps - 30 sec hold - Seated Piriformis Stretch  - 2 x daily - 7 x weekly - 3 reps - 30 sec hold - Supine Posterior Pelvic Tilt  - 1 x daily - 7 x weekly - 2-3 sets - 10 reps - 3 sec hold - Supine March with Resistance Band  - 1 x daily - 3 x weekly - 2 sets - 10 reps - 2-3 sec hold hold - Hooklying Single Leg Bent Knee Fallouts with Resistance  - 1 x daily - 3 x weekly - 2 sets - 10 reps - 3 sec hold - Supine Bridge with Mini Swiss Ball Between Knees  - 1 x daily - 3 x weekly - 2 sets - 10 reps - 5 sec hold - Standing Shoulder Row with Anchored Resistance  - 1 x daily - 3 x weekly - 2 sets - 10 reps - 5 sec hold - Scapular Retraction with Resistance Advanced  - 1 x daily - 3 x weekly - 2 sets - 10 reps - 5 sec hold - Standing Trunk Rotation with Resistance  - 1 x daily - 3 x weekly - 2 sets - 10 reps - 3 sec hold - Isometric Squat with Resistance Loop  - 1 x daily - 3 x weekly - 2 sets - 10 reps - 3 sec hold - Heel Toe Raises with Counter Support  - 1 x daily - 3 x weekly - 2 sets - 10 reps - 3 sec hold - Side Stepping with Resistance at Feet  - 1 x daily - 3 x weekly - 2 sets - 10 reps - Band Walks  - 1 x daily - 3 x weekly - 2 sets - 10 reps  Patient Education - Posture and Body Mechanics   ASSESSMENT:  CLINICAL IMPRESSION: Adriahna is very pleased with her progress noting improvement in her standing, walking and overall activity tolerance w/o limitation due to LBP or LE radiculopathy. Her lumbar ROM  is now WNL/WFL w/o pain provocation. B LE strength improved to 4+ to 5/5 with only exception being R hip abduction at 4/5. We reviewed posture and body mechanics education today as she has been continuing to work on furniture rearrangement in her home and this has created some irritation  at times. All goal now met and she feels confident with our plan to transition to her HEP but would like to remain on hold for 30-days in the event that issues arise that would necessitate a return to PT.  OBJECTIVE IMPAIRMENTS decreased activity tolerance, decreased balance, decreased endurance, decreased knowledge of condition, decreased mobility, difficulty walking, decreased ROM, decreased strength, hypomobility, increased fascial restrictions, impaired perceived functional ability, increased muscle spasms, impaired flexibility, impaired sensation, improper body mechanics, postural dysfunction, and pain.   ACTIVITY LIMITATIONS sitting, standing, stairs, and locomotion level  PARTICIPATION LIMITATIONS: meal prep, cleaning, laundry, shopping, community activity, yard work, and exercising for weight loss  PERSONAL FACTORS Past/current experiences, Time since onset of injury/illness/exacerbation, and 3+ comorbidities: Fall with L wrist and R ankle fx 11/2019, R TKA 08/05/16, OA, osteopenia, anxiety, depression, syncope, HLD  are also affecting patient's functional outcome.   REHAB POTENTIAL: Good  CLINICAL DECISION MAKING: Evolving/moderate complexity  EVALUATION COMPLEXITY: Moderate   GOALS: Goals reviewed with patient? Yes  SHORT TERM GOALS: Target date: 09/03/2021   Patient will be independent with initial HEP.  Baseline:  Goal status: MET  08/23/21  2.  Patient will report centralization of radicular symptoms.  Baseline:  Goal status: MET  08/20/21 - Pt reports no radicular symptoms since the ESI   LONG TERM GOALS: Target date: 09/24/2021    Patient will be independent with advanced/ongoing HEP to  improve outcomes and carryover.  Baseline:  Goal status: MET  09/24/21  2.  Patient will report 75% improvement in low back & R LE radicular pain to improve QOL.  Baseline:  Goal status: MET  08/30/21 - pt reports 100% improvement   3.  Patient to demonstrate ability to achieve and maintain good spinal alignment/posturing and body mechanics needed for daily activities. Baseline:  Goal status: MET  09/24/21  4.  Patient will demonstrate full pain free lumbar ROM to perform ADLs.   Baseline:  Goal status: MET  09/20/21  5.  Patient will demonstrate improved B LE strength to >/= 4 to 4+/5 for improved stability and ease of mobility . Baseline:  Goal status: MET  09/24/21  6.  Patient will report 80 on lumbar FOTO to demonstrate improved functional ability.  Baseline: 41 Goal status: MET  09/24/21 - FOTO = 69  7.  Patient will tolerate >/= 30 min of standing and sitting to perform household chores with decreased pain interference. Baseline:  Goal status: MET  09/24/21 - 20-25 minutes with static stance, but longer if she is able to stretch or shift her weight (able to prepare a meal while standing w/o issue); 30-45 min with sitting  8.  Patient will improve walking tolerance to >/= 30 minutes w/o pain interference to allow improved tolerance for shopping or walking for exercise. Baseline:  Goal status: MET  09/24/21 - 30-45 minutes w/o having to hold onto things   PLAN: PT FREQUENCY: 2x/week  PT DURATION: 6 weeks  PLANNED INTERVENTIONS: Therapeutic exercises, Therapeutic activity, Neuromuscular re-education, Balance training, Gait training, Patient/Family education, Joint mobilization, Stair training, Aquatic Therapy, Dry Needling, Electrical stimulation, Spinal mobilization, Cryotherapy, Moist heat, Taping, Traction, Ultrasound, Ionotophoresis 33m/ml Dexamethasone, Manual therapy, and Re-evaluation.  PLAN FOR NEXT SESSION: transition to HEP + 30-day hold   JPercival Spanish  PT 09/24/2021, 12:13 PM   PHYSICAL THERAPY DISCHARGE SUMMARY  Visits from Start of Care: 10  Current functional level related to goals / functional outcomes:   Refer to above clinical impression and goal  assessment for status as of last visit on 09/24/2021. Patient was placed on hold for 30 days and has not needed to return to PT, therefore will proceed with discharge from PT for this episode.      Remaining deficits:   As above.   Education / Equipment:   HEP; Biomedical scientist   Patient agrees to discharge. Patient goals were met. Patient is being discharged due to meeting the stated rehab goals.  Percival Spanish, PT, MPT 11/05/21, 9:11 AM  [facility] [address]  [place], [place], [address] Phone: [phone]   Fax:  [phone]

## 2021-10-02 ENCOUNTER — Telehealth: Payer: Self-pay | Admitting: Gastroenterology

## 2021-10-02 NOTE — Telephone Encounter (Signed)
The pt has been advised and will increase miralax to twice daily.  She will keep appt as planned.

## 2021-10-02 NOTE — Telephone Encounter (Signed)
History of ischemic colitis due to constipation. Is taking 1 cap miralax, 2 scoops benefiber and is having regular bowel movements daily.  She has recently had abd cramping that doubles her over in severe pain.  She has passed out during these episodes.   Yesterday she had a good BM in the morning but last night she developed the severe abd pain that doubled her over.  Had another BM then the cramping/pain began and she passed another massive amount of stool.  Her stools are formed and soft.  Never hard and no bleeding.  No bloating.  This is the 2nd episode in a few months.  Has a follow up appt 8/25.  Please advise

## 2021-10-24 ENCOUNTER — Encounter: Payer: Self-pay | Admitting: Family Medicine

## 2021-10-24 ENCOUNTER — Telehealth: Payer: Self-pay

## 2021-10-24 NOTE — Telephone Encounter (Signed)
Monique Gonzalez, please see note below. Pt last saw Dr. Ardis Hughs, but he is out of the office at this time. Please advise, thanks.

## 2021-10-24 NOTE — Telephone Encounter (Signed)
I called this patient to get her rescheduled off the bump list. She states she called a few weeks ago and was given advise that seems to be helping. She has seen Ellouise Newer, PA in the past and would like to know if she recommends she come in for the office visit or wait.

## 2021-10-25 ENCOUNTER — Other Ambulatory Visit: Payer: Self-pay | Admitting: Family Medicine

## 2021-10-25 MED ORDER — TRAMADOL HCL 50 MG PO TABS: 50.0000 mg | ORAL_TABLET | Freq: Four times a day (QID) | ORAL | 0 refills | Status: AC | PRN

## 2021-10-28 NOTE — Telephone Encounter (Signed)
Returned call to patient. I informed her that if she is doing better then she can follow up PRN. Pt states that if she has another "episode" she will call us to make an appt. Pt had no other concerns at the end of the call.

## 2021-11-01 ENCOUNTER — Ambulatory Visit: Payer: Medicare Other | Admitting: Gastroenterology

## 2021-12-02 ENCOUNTER — Other Ambulatory Visit (HOSPITAL_BASED_OUTPATIENT_CLINIC_OR_DEPARTMENT_OTHER): Payer: Self-pay

## 2021-12-02 MED ORDER — FLUAD QUADRIVALENT 0.5 ML IM PRSY
PREFILLED_SYRINGE | INTRAMUSCULAR | 0 refills | Status: DC
  Filled 2021-12-02: qty 0.5, 1d supply, fill #0

## 2021-12-04 ENCOUNTER — Ambulatory Visit (INDEPENDENT_AMBULATORY_CARE_PROVIDER_SITE_OTHER): Payer: Medicare Other | Admitting: Physician Assistant

## 2021-12-04 ENCOUNTER — Encounter: Payer: Self-pay | Admitting: Physician Assistant

## 2021-12-04 ENCOUNTER — Ambulatory Visit (INDEPENDENT_AMBULATORY_CARE_PROVIDER_SITE_OTHER)
Admission: RE | Admit: 2021-12-04 | Discharge: 2021-12-04 | Disposition: A | Discharge status: Home / Self Care | Payer: Medicare Other | Source: Ambulatory Visit | Attending: Physician Assistant | Admitting: Physician Assistant

## 2021-12-04 VITALS — BP 140/86 | HR 97 | Ht 70.0 in | Wt 268.1 lb

## 2021-12-04 DIAGNOSIS — K5900 Constipation, unspecified: Secondary | ICD-10-CM

## 2021-12-04 DIAGNOSIS — K551 Chronic vascular disorders of intestine: Secondary | ICD-10-CM

## 2021-12-04 NOTE — Progress Notes (Signed)
Chief Complaint: Abdominal pain and loose stools  Review of pertinent gastrointestinal problems: 1.  Chronic Colonic Ischemia : 10/2013 attempted colonoscopy.  For abdominal pain, constipation and abnormal CT.  This showed stenosis at the splenic flexure.  Could not pass scope beyond this area.  Pathology from biopsies do not support a diagnosis of IBD. 10/2013 segmental colectomy for splenic stricture likely from chronic ischemia.  Pathology confirmed ulceration, inflammation, fibrosis favoring ischemia 12/2013 colonoscopy.  Dr. Ardis Hughs.   Terminal ileum normal.  Left sided anastomosis mildly inflamed.  Colon proximal to anastomosis tortuous with an area that appeared to have chronic scarring.  Otherwise unremarkable study.  Right and left colon randomly biopsied. Right colon biopsy showed prominent lymphoid aggregates, benign.  No features of IBD, microscopic colitis, infectious colitis and no dysplasia.  Left colon biopsies showed benign mucosa with crypt distortion and nonspecific inflammation no dysplasia.  Repeat colonoscopy 02/2020 when hospitalized for "diverticulitis" versus IBD:: Findings poor prep, aborted procedure, transverse colon mucosal ulceration.  Biopsies came back consistent with ischemic injury.  She was recommended to stay on a bowel regimen to prevent constipation.  HPI:    Monique Gonzalez is a 66 year old female with a past medical history as listed below and above with prior ischemic injury to her bowel causing stricturing, known to Dr. Ardis Hughs, who presents to clinic today with a complaint of abdominal pain and loose stools.    05/02/2020 office visit with Dr. Ardis Hughs and at that time was following up after she had seen me 6 weeks prior.  Discussed that she had trouble with constipation for many years which had resulted in ischemic damage to her colon at least twice.  Since she had been in the office/she focused on good healthy bowel regimen with MiraLAX and Benefiber 3 times daily but it  was too effective so she decreased to twice daily which worked well for a week or 2 but then also became a bit too effective.  She decreased to once daily single dosing of MiraLAX and Benefiber.  Also took Stool softeners twice daily.    10/02/2021 patient called in with complications of constipation.  She was told to increase her MiraLAX to twice daily and continue Benefiber once a day.    Today, patient presents to clinic accompanied by her twin sister.  She explains that she is still taking MiraLAX twice a day and a stool softener twice a day as well as Benefiber in the morning but her stools are just "thin strands of mush".  Tells me that she does not ever feel like she needs to have a bowel movement but anytime she sits down to urinate she will often pass stool as well.  Describes she has had 2 episodes of lower abdominal cramping and syncope while passing a stool prior to increasing her MiraLAX at times when she was more constipated.  She wonders if there is solid stool that everything is sliding around or if she just has too much laxative.    Also describes a pinched nerve in her back.  Apparently has been asked by her neurologist in the past if she has trouble with her bowels and asked today if this could be contributing to her symptoms from time to time.    Denies fever, chills, weight loss, blood in her stool or symptoms that awaken her from sleep.  Past Medical History:  Diagnosis Date   Allergy    Anxiety    Phreesia 06/04/2020   Arthritis  Phreesia 07/08/2020   Chronic pain    back and knee   Colitis 09/2012   infectious vs inflammatory.    Colon stricture [facility])    Depression    Depression    Phreesia 06/04/2020   Depression    Phreesia 07/08/2020   GERD (gastroesophageal reflux disease)    Phreesia 06/04/2020   Hyperlipidemia    Obesity    Osteoarthritis    Osteopenia    Recurrent cold sores    Seasonal allergies    Syncope and collapse 11/30/2012   Normal EEG.  Vaso  vagal syncope    Past Surgical History:  Procedure Laterality Date   BIOPSY  02/21/2020   Procedure: BIOPSY;  Surgeon: Gatha Mayer, MD;  Location: [place];  Service: Endoscopy;;   COLON RESECTION N/A 10/14/2013   Procedure: LAPAROSCOPIC MOBILIZATION OF SPLENIC FLEXURE, LAPAROSCOPIC EXTENDED LEFT COLECTOMY;  Surgeon: [name], MD;  Location: [place];  Service: General;  Laterality: N/A;   COLON SURGERY N/A    Phreesia 06/04/2020   COLONOSCOPY N/A 10/12/2013   Procedure: COLONOSCOPY;  Surgeon: [name], MD;  Location: [place];  Service: Endoscopy;  Laterality: N/A;   COLONOSCOPY WITH PROPOFOL N/A 02/21/2020   Procedure: COLONOSCOPY WITH PROPOFOL;  Surgeon: Gatha Mayer, MD;  Location: [facility] ENDOSCOPY;  Service: Endoscopy;  Laterality: N/A;   DILATION AND CURETTAGE, DIAGNOSTIC / THERAPEUTIC  1987   FRACTURE SURGERY N/A    Phreesia 06/04/2020   JOINT REPLACEMENT N/A    Phreesia 06/04/2020   OPEN REDUCTION INTERNAL FIXATION (ORIF) DISTAL RADIAL FRACTURE Right 08/08/2014   Procedure: OPEN REDUCTION INTERNAL FIXATION (ORIF) DISTAL RADIAL FRACTURE;  Surgeon: Roseanne Kaufman, MD;  Location: [place];  Service: Orthopedics;  Laterality: Right;  DVR Crosslock, MD available sometime around 1430-1500   ORIF ANKLE FRACTURE Right 11/10/2019   Procedure: POSSIBLEOPEN REDUCTION INTERNAL FIXATION (ORIF) ANKLE FRACTURE;  Surgeon: Shona Needles, MD;  Location: [place];  Service: Orthopedics;  Laterality: Right;   ORIF WRIST FRACTURE Left 11/10/2019   Procedure: OPEN REDUCTION INTERNAL FIXATION (ORIF) WRIST FRACTURE;  Surgeon: Shona Needles, MD;  Location: [place];  Service: Orthopedics;  Laterality: Left;   TONSILLECTOMY     TOTAL KNEE ARTHROPLASTY Right 08/05/2016   TOTAL KNEE ARTHROPLASTY Right 08/05/2016   Procedure: RIGHT TOTAL KNEE ARTHROPLASTY;  Surgeon: Mcarthur Rossetti, MD;  Location: [place];  Service: Orthopedics;  Laterality: Right;    Current Outpatient Medications  Medication  Sig Dispense Refill   acetaminophen (TYLENOL) 650 MG CR tablet Take 1,300 mg by mouth at bedtime.     atorvastatin (LIPITOR) 40 MG tablet TAKE 1 TABLET EVERY DAY 90 tablet 2   azithromycin (ZITHROMAX) 250 MG tablet 2 tabs poqday1, 1 tab poqday 2-5 6 tablet 0   b complex vitamins capsule Take 1 capsule by mouth daily.     Calcium Carb-Cholecalciferol (CALCIUM 600+D3 PO) Take 1 tablet by mouth in the morning and at bedtime.     Cholecalciferol (VITAMIN D-3) 25 MCG (1000 UT) CAPS Take 1,000 Units by mouth daily with breakfast.     cyclobenzaprine (FLEXERIL) 10 MG tablet TAKE 1 TABLET THREE TIMES DAILY AS NEEDED FOR MUSCLE SPASM(S) 270 tablet 1   docusate sodium (COLACE) 100 MG capsule Take 1 capsule (100 mg total) by mouth 2 (two) times daily. 60 capsule 0   influenza vaccine adjuvanted (FLUAD QUADRIVALENT) 0.5 ML injection Inject into the muscle. 0.5 mL 0   loratadine (CLARITIN) 10 MG tablet Take 10  mg by mouth at bedtime.     Melatonin 10 MG TABS Take 10 mg by mouth at bedtime.     Multiple Vitamins-Minerals (ONE-A-DAY WOMENS PO) Take 1 tablet by mouth daily with breakfast.     Omega-3 Fatty Acids (FISH OIL) 1000 MG CAPS Take 1,000 mg by mouth daily after breakfast.     pantoprazole (PROTONIX) 40 MG tablet TAKE 1 TABLET EVERY DAY 90 tablet 2   polyethylene glycol (MIRALAX / GLYCOLAX) 17 g packet Take 17 g by mouth daily.     predniSONE (DELTASONE) 20 MG tablet 3 tabs poqday 1-2, 2 tabs poqday 3-4, 1 tab poqday 5-6 12 tablet 0   venlafaxine XR (EFFEXOR-XR) 75 MG 24 hr capsule TAKE 2 CAPSULES EVERY DAY WITH BREAKFAST 180 capsule 3   Wheat Dextrin (BENEFIBER) POWD Take by mouth. 2 teaspoons daily     No current facility-administered medications for this visit.    Allergies as of 12/04/2021 - Review Complete 12/04/2021  Allergen Reaction Noted   Ciprofloxacin Itching, Dermatitis, and Rash 02/19/2020   Penicillins Rash and Other (See Comments) 09/23/2010   Sulfa antibiotics Rash 09/23/2010     Family History  Problem Relation Age of Onset   Stroke Mother    Arthritis Mother    Thyroid disease Mother    Cancer Father    Arthritis Sister    Thyroid disease Sister    Cancer Maternal Grandmother    Stroke Maternal Grandfather    Cancer Maternal Grandfather    Colon cancer Maternal Grandfather    Cancer Paternal Grandmother    Heart disease Paternal Grandmother    Colon cancer Paternal Grandmother    Stroke Paternal Grandfather    Arthritis Sister    Obesity Sister    Sudden death Neg Hx    Hypertension Neg Hx    Hyperlipidemia Neg Hx    Heart attack Neg Hx    Diabetes Neg Hx    Rectal cancer Neg Hx    Stomach cancer Neg Hx    Esophageal cancer Neg Hx     Social History   Socioeconomic History   Marital status: Divorced    Spouse name: Not on file   Number of children: 2   Years of education: college   Highest education level: Not on file  Occupational History   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    Employer: [employer]    Comment: LPN  Tobacco Use   Smoking status: Former    Types: Cigarettes    Quit date: 10/27/1993    Years since quitting: 28.1   Smokeless tobacco: Never  Vaping Use   Vaping Use: Never used  Substance and Sexual Activity   Alcohol use: Yes    Comment: one drink per week   Drug use: No   Sexual activity: Not Currently  Other Topics Concern   Not on file  Social History Narrative   Not on file   Social Determinants of Health   Financial Resource Strain: Not on file  Food Insecurity: Not on file  Transportation Needs: Not on file  Physical Activity: Not on file  Stress: Not on file  Social Connections: Not on file  Intimate Partner Violence: Not on file    Review of Systems:    Constitutional: No weight loss, fever or chills Cardiovascular: No chest pain   Respiratory: No SOB Gastrointestinal: See HPI and otherwise negative   Physical Exam:  Vital signs: BP (!) 140/86   Pulse 97  Ht '5\' 10"'$  (1.778  m)   Wt 268 lb 2 oz (121.6 kg)   BMI 38.47 kg/m    Constitutional:   Pleasant obese Caucasian female appears to be in NAD, Well developed, Well nourished, alert and cooperative Respiratory: Respirations even and unlabored. Lungs clear to auscultation bilaterally.   No wheezes, crackles, or rhonchi.  Cardiovascular: Normal S1, S2. No MRG. Regular rate and rhythm. No peripheral edema, cyanosis or pallor.  Gastrointestinal:  Soft, nondistended, nontender. No rebound or guarding. Normal bowel sounds. No appreciable masses or hepatomegaly. Rectal:  Not performed.  Psychiatric: Oriented to person, place and time. Demonstrates good judgement and reason without abnormal affect or behaviors.  No recent labs or imaging  Assessment: 1.  Chronic constipation: With a history of ischemic bowel change and stricturing due to this, prior partial colectomy, typically on a very strict bowel regimen which keeps things moving but has had a couple episodes of constipation which have caused lower abdominal cramping and syncope while on the toilet, currently stools are mushy on MiraLAX twice a day, Benefiber once a day and a stool softener twice a day  Plan: 1.  Discussed that constipation is likely due to her structural changes, but her pinched nerve could be contributing at times.  Told her to let her orthopedic physicians know about her constipation. 2.  Ordered 2 view abdominal x-ray today to see the status of her stool.  Pending results she can increase or decrease her regimen. 3.  We did discuss that patient will likely need to titrate her laxatives throughout the rest of her life given her known anatomy. 4.  Patient to follow in clinic per recommendations after above.  Chart sent to Dr. Tarri Glenn this morning in Dr. Ardis Hughs absence.  Ellouise Newer, PA-C [facility] 12/04/2021, 10:01 AM  Cc: Susy Frizzle, MD

## 2021-12-04 NOTE — Patient Instructions (Signed)
_______________________________________________________  If you are age 68 or older, your body mass index should be between 23-30. Your Body mass index is 38.47 kg/m. If this is out of the aforementioned range listed, please consider follow up with your Primary Care Provider.  If you are age 6 or younger, your body mass index should be between 19-25. Your Body mass index is 38.47 kg/m. If this is out of the aformentioned range listed, please consider follow up with your Primary Care Provider.   ________________________________________________________  The  GI providers would like to encourage you to use [facility] to communicate with providers for non-urgent requests or questions.  Due to long hold times on the telephone, sending your provider a message by [facility] may be a faster and more efficient way to get a response.  Please allow 48 business hours for a response.  Please remember that this is for non-urgent requests.  _______________________________________________________  Your provider has requested that you go to the basement level for an X-ray before leaving today. Press "B" on the elevator.   It was a pleasure to see you today!  Thank you for trusting me with your gastrointestinal care!

## 2021-12-06 NOTE — Progress Notes (Signed)
Reviewed and agree with management plans. ? ?Kirsten Spearing L. Marjarie Irion, MD, MPH  ?

## 2022-01-02 ENCOUNTER — Ambulatory Visit: Payer: Medicare Other | Admitting: Family Medicine

## 2022-01-03 ENCOUNTER — Encounter: Payer: Self-pay | Admitting: Family Medicine

## 2022-01-03 ENCOUNTER — Ambulatory Visit (INDEPENDENT_AMBULATORY_CARE_PROVIDER_SITE_OTHER): Payer: Medicare Other | Admitting: Family Medicine

## 2022-01-03 VITALS — BP 120/72 | HR 85 | Temp 99.1°F | Ht 70.0 in | Wt 271.8 lb

## 2022-01-03 DIAGNOSIS — J019 Acute sinusitis, unspecified: Secondary | ICD-10-CM

## 2022-01-03 DIAGNOSIS — B9689 Other specified bacterial agents as the cause of diseases classified elsewhere: Secondary | ICD-10-CM | POA: Diagnosis not present

## 2022-01-03 MED ORDER — LEVOFLOXACIN 500 MG PO TABS: 500.0000 mg | ORAL_TABLET | Freq: Every day | ORAL | 0 refills | Status: AC

## 2022-01-03 NOTE — Progress Notes (Signed)
Subjective:    [name], female    DOB: 1956-02-14, 66 y.o.   MRN: [id number]  Sinus Problem  Patient is a very pleasant 66 year old Caucasian female who presents today concerned that she has a sinus infection.  She has been feeling extremely weak and dizzy for over a week.  Last night, she got so dizzy that she almost passed out while taking a shower.  It is more of a lightheadedness than vertigo.  Her blood pressure at that time was extremely low.  Her systolic blood pressure was 91.  EMS was called.  Pulse oximetry was 99%.  Pulse was regular and normal sinus rhythm in the 90s.  After laying in the floor for some time, her blood pressure improved to 170 systolic although she still feels weak and dizzy.  She reports a constant headache behind both of her eyes and in her forehead.  She reports blowing gray and white bloody purulent discharge from her nose when she blows her nose.  She also removed a blood clot from her left nostril when she performed a COVID test at home yesterday that was negative Past Medical History:  Diagnosis Date   Allergy    Anxiety    Phreesia 06/04/2020   Arthritis    Phreesia 07/08/2020   Chronic pain    back and knee   Colitis 09/2012   infectious vs inflammatory.    Colon stricture [facility])    Depression    Depression    Phreesia 06/04/2020   Depression    Phreesia 07/08/2020   GERD (gastroesophageal reflux disease)    Phreesia 06/04/2020   Hyperlipidemia    Obesity    Osteoarthritis    Osteopenia    Recurrent cold sores    Seasonal allergies    Syncope and collapse 11/30/2012   Normal EEG.  Vaso vagal syncope   Past Surgical History:  Procedure Laterality Date   BIOPSY  02/21/2020   Procedure: BIOPSY;  Surgeon: Gatha Mayer, MD;  Location: [place];  Service: Endoscopy;;   COLON RESECTION N/A 10/14/2013   Procedure: LAPAROSCOPIC MOBILIZATION OF SPLENIC FLEXURE, LAPAROSCOPIC EXTENDED LEFT COLECTOMY;  Surgeon: [name], MD;   Location: [place];  Service: General;  Laterality: N/A;   COLON SURGERY N/A    Phreesia 06/04/2020   COLONOSCOPY N/A 10/12/2013   Procedure: COLONOSCOPY;  Surgeon: [name], MD;  Location: [place];  Service: Endoscopy;  Laterality: N/A;   COLONOSCOPY WITH PROPOFOL N/A 02/21/2020   Procedure: COLONOSCOPY WITH PROPOFOL;  Surgeon: Gatha Mayer, MD;  Location: [facility] ENDOSCOPY;  Service: Endoscopy;  Laterality: N/A;   DILATION AND CURETTAGE, DIAGNOSTIC / THERAPEUTIC  1987   FRACTURE SURGERY N/A    Phreesia 06/04/2020   JOINT REPLACEMENT N/A    Phreesia 06/04/2020   OPEN REDUCTION INTERNAL FIXATION (ORIF) DISTAL RADIAL FRACTURE Right 08/08/2014   Procedure: OPEN REDUCTION INTERNAL FIXATION (ORIF) DISTAL RADIAL FRACTURE;  Surgeon: Roseanne Kaufman, MD;  Location: [place];  Service: Orthopedics;  Laterality: Right;  DVR Crosslock, MD available sometime around 1430-1500   ORIF ANKLE FRACTURE Right 11/10/2019   Procedure: POSSIBLEOPEN REDUCTION INTERNAL FIXATION (ORIF) ANKLE FRACTURE;  Surgeon: Shona Needles, MD;  Location: [place];  Service: Orthopedics;  Laterality: Right;   ORIF WRIST FRACTURE Left 11/10/2019   Procedure: OPEN REDUCTION INTERNAL FIXATION (ORIF) WRIST FRACTURE;  Surgeon: Shona Needles, MD;  Location: [place];  Service: Orthopedics;  Laterality: Left;   TONSILLECTOMY  TOTAL KNEE ARTHROPLASTY Right 08/05/2016   TOTAL KNEE ARTHROPLASTY Right 08/05/2016   Procedure: RIGHT TOTAL KNEE ARTHROPLASTY;  Surgeon: Mcarthur Rossetti, MD;  Location: [place];  Service: Orthopedics;  Laterality: Right;   Current Outpatient Medications on File Prior to Visit  Medication Sig Dispense Refill   acetaminophen (TYLENOL) 650 MG CR tablet Take 1,300 mg by mouth at bedtime.     atorvastatin (LIPITOR) 40 MG tablet TAKE 1 TABLET EVERY DAY 90 tablet 2   azithromycin (ZITHROMAX) 250 MG tablet 2 tabs poqday1, 1 tab poqday 2-5 6 tablet 0   b complex vitamins capsule Take 1 capsule by mouth daily.      Calcium Carb-Cholecalciferol (CALCIUM 600+D3 PO) Take 1 tablet by mouth in the morning and at bedtime.     Cholecalciferol (VITAMIN D-3) 25 MCG (1000 UT) CAPS Take 1,000 Units by mouth daily with breakfast.     cyclobenzaprine (FLEXERIL) 10 MG tablet TAKE 1 TABLET THREE TIMES DAILY AS NEEDED FOR MUSCLE SPASM(S) 270 tablet 1   docusate sodium (COLACE) 100 MG capsule Take 1 capsule (100 mg total) by mouth 2 (two) times daily. 60 capsule 0   influenza vaccine adjuvanted (FLUAD QUADRIVALENT) 0.5 ML injection Inject into the muscle. 0.5 mL 0   loratadine (CLARITIN) 10 MG tablet Take 10 mg by mouth at bedtime.     Melatonin 10 MG TABS Take 10 mg by mouth at bedtime.     Multiple Vitamins-Minerals (ONE-A-DAY WOMENS PO) Take 1 tablet by mouth daily with breakfast.     Omega-3 Fatty Acids (FISH OIL) 1000 MG CAPS Take 1,000 mg by mouth daily after breakfast.     pantoprazole (PROTONIX) 40 MG tablet TAKE 1 TABLET EVERY DAY 90 tablet 2   polyethylene glycol (MIRALAX / GLYCOLAX) 17 g packet Take 17 g by mouth daily.     predniSONE (DELTASONE) 20 MG tablet 3 tabs poqday 1-2, 2 tabs poqday 3-4, 1 tab poqday 5-6 12 tablet 0   venlafaxine XR (EFFEXOR-XR) 75 MG 24 hr capsule TAKE 2 CAPSULES EVERY DAY WITH BREAKFAST 180 capsule 3   Wheat Dextrin (BENEFIBER) POWD Take by mouth. 2 teaspoons daily     No current facility-administered medications on file prior to visit.   Allergies  Allergen Reactions   Ciprofloxacin Itching, Dermatitis and Rash   Penicillins Rash and Other (See Comments)    Has patient had a PCN reaction causing immediate rash, facial/tongue/throat swelling, SOB or lightheadedness with hypotension: Yes Has patient had a PCN reaction causing severe rash involving mucus membranes or skin necrosis: Unknown Has patient had a PCN reaction that required hospitalization: No Has patient had a PCN reaction occurring within the last 10 years: No If all of the above answers are "NO", then may proceed with  Cephalosporin use. Childhood reaction.   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    Employer: [employer]    Comment: LPN  Tobacco Use   Smoking status: Former    Types: Cigarettes    Quit date: 10/27/1993    Years since quitting: 28.2   Smokeless tobacco: Never  Vaping Use   Vaping Use: Never used  Substance and Sexual Activity   Alcohol use: Yes  Comment: one drink per week   Drug use: No   Sexual activity: Not Currently  Other Topics Concern   Not on file  Social History Narrative   Not on file   Social Determinants of Health   Financial Resource Strain: Not on file  Food Insecurity: Not on file  Transportation Needs: Not on file  Physical Activity: Not on file  Stress: Not on file  Social Connections: Not on file  Intimate Partner Violence: Not on file   Family History  Problem Relation Age of Onset   Stroke Mother    Arthritis Mother    Thyroid disease Mother    Cancer Father    Arthritis Sister    Thyroid disease Sister    Cancer Maternal Grandmother    Stroke Maternal Grandfather    Cancer Maternal Grandfather    Colon cancer Maternal Grandfather    Cancer Paternal Grandmother    Heart disease Paternal Grandmother    Colon cancer Paternal Grandmother    Stroke Paternal Grandfather    Arthritis Sister    Obesity Sister    Sudden death Neg Hx    Hypertension Neg Hx    Hyperlipidemia Neg Hx    Heart attack Neg Hx    Diabetes Neg Hx    Rectal cancer Neg Hx    Stomach cancer Neg Hx    Esophageal cancer Neg Hx       Review of Systems  All other systems reviewed and are negative.      Objective:   Physical Exam Vitals reviewed.  Constitutional:      General: She is not in acute distress.     Appearance: She is obese. She is not ill-appearing, toxic-appearing or diaphoretic.  HENT:     Right Ear: Tympanic membrane and ear canal normal.     Left Ear: Tympanic membrane and ear canal normal.     Nose: Congestion and rhinorrhea present.     Right Sinus: Frontal sinus tenderness present.     Left Sinus: Frontal sinus tenderness present.     Mouth/Throat:     Mouth: Mucous membranes are moist.     Pharynx: Oropharynx is clear. No oropharyngeal exudate or posterior oropharyngeal erythema.  Cardiovascular:     Rate and Rhythm: Normal rate and regular rhythm.     Pulses: Normal pulses.     Heart sounds: Normal heart sounds. No murmur heard.    No friction rub. No gallop.  Pulmonary:     Effort: Pulmonary effort is normal. No respiratory distress.     Breath sounds: Normal breath sounds. No stridor. No wheezing, rhonchi or rales.  Chest:     Chest wall: No tenderness.  Musculoskeletal:     Cervical back: No muscular tenderness.  Lymphadenopathy:     Cervical: No cervical adenopathy.  Neurological:     Mental Status: She is alert.           Assessment & Plan:  Acute bacterial rhinosinusitis I believe the patient has a sinus infection, and then dehydration causing hypotension.  Begin Levaquin 500 mg p.o. daily for 7 days for the sinus infection based on her allergy profile was.  Push fluids such as Gatorade to improve her blood pressure, eat sodium containing foods such as canned soup to improve blood pressure and rest.  Recheck next week if no better or sooner if worse

## 2022-01-07 ENCOUNTER — Other Ambulatory Visit (HOSPITAL_COMMUNITY): Payer: Self-pay

## 2022-01-07 ENCOUNTER — Encounter: Payer: Self-pay | Admitting: Family Medicine

## 2022-01-07 MED ORDER — LINACLOTIDE 290 MCG PO CAPS: ORAL_CAPSULE | ORAL | 3 refills | Status: DC

## 2022-01-07 NOTE — Telephone Encounter (Signed)
[place], would you be able to enroll patient in patient assistance for [employer]? Not sure if the prescription requires a PA. Thanks

## 2022-01-07 NOTE — Addendum Note (Signed)
Addended by: Yevette Edwards on: 01/07/2022 02:25 PM   Modules accepted: Orders

## 2022-01-08 ENCOUNTER — Other Ambulatory Visit (HOSPITAL_COMMUNITY): Payer: Self-pay

## 2022-01-08 MED ORDER — LINACLOTIDE 290 MCG PO CAPS: ORAL_CAPSULE | ORAL | 3 refills | Status: AC

## 2022-01-08 NOTE — Telephone Encounter (Signed)
See alternate patient message. Pt has been approved for patient assistance through [employer].

## 2022-01-08 NOTE — Addendum Note (Signed)
Addended by: Yevette Edwards on: 01/08/2022 12:30 PM   Modules accepted: Orders

## 2022-01-08 NOTE — Telephone Encounter (Signed)
That's great. I've discarded prepared documents  set to mail. I'm very happy she was able to get assistance through [employer].

## 2022-01-16 ENCOUNTER — Other Ambulatory Visit (HOSPITAL_COMMUNITY): Payer: Self-pay

## 2022-01-27 ENCOUNTER — Telehealth: Payer: Self-pay | Admitting: Physician Assistant

## 2022-01-27 NOTE — Telephone Encounter (Signed)
Patient called states they are telling her the forms were not received. She is requesting we refax to these numbers: Fax: [phone] and [phone]

## 2022-01-29 NOTE — Telephone Encounter (Signed)
Patient assistance provider prescription form has been re-faxed to [phone].

## 2022-02-04 ENCOUNTER — Other Ambulatory Visit (HOSPITAL_BASED_OUTPATIENT_CLINIC_OR_DEPARTMENT_OTHER): Payer: Self-pay

## 2022-02-04 MED ORDER — COMIRNATY 30 MCG/0.3ML IM SUSY
PREFILLED_SYRINGE | INTRAMUSCULAR | 0 refills | Status: DC
  Filled 2022-02-04: qty 0.3, 1d supply, fill #0

## 2022-02-04 MED ORDER — AREXVY 120 MCG/0.5ML IM SUSR
INTRAMUSCULAR | 0 refills | Status: DC
  Filled 2022-02-04: qty 0.5, 1d supply, fill #0

## 2022-02-04 NOTE — Telephone Encounter (Signed)
Patient reviewed and responded to MyChart message. Last read by [name] at 10:25 AM on 02/04/2022.

## 2022-02-04 NOTE — Telephone Encounter (Signed)
Attempted to reach [employer] to follow up on [place] request. Wait time was over 20 minutes. I sent patient a MyChart message to follow up and see if she has received the medication or not.

## 2022-02-19 ENCOUNTER — Telehealth: Payer: Self-pay | Admitting: Physician Assistant

## 2022-02-19 NOTE — Telephone Encounter (Signed)
Patient is calling wishing to speak with Monique Gonzalez states she is still having trouble with Monique Gonzalez states they don't have all the information that they need. Please advise

## 2022-02-24 ENCOUNTER — Encounter: Payer: Self-pay | Admitting: Family Medicine

## 2022-02-24 ENCOUNTER — Ambulatory Visit (HOSPITAL_BASED_OUTPATIENT_CLINIC_OR_DEPARTMENT_OTHER)
Admission: RE | Admit: 2022-02-24 | Discharge: 2022-02-24 | Disposition: A | Discharge status: Home / Self Care | Payer: Medicare Other | Source: Ambulatory Visit | Attending: Family Medicine | Admitting: Family Medicine

## 2022-02-24 ENCOUNTER — Ambulatory Visit (INDEPENDENT_AMBULATORY_CARE_PROVIDER_SITE_OTHER): Payer: Medicare Other | Admitting: Family Medicine

## 2022-02-24 VITALS — BP 128/76 | HR 97 | Ht 70.0 in | Wt 289.0 lb

## 2022-02-24 DIAGNOSIS — M5432 Sciatica, left side: Secondary | ICD-10-CM

## 2022-02-24 DIAGNOSIS — M5431 Sciatica, right side: Secondary | ICD-10-CM | POA: Insufficient documentation

## 2022-02-24 MED ORDER — PREDNISONE 20 MG PO TABS: ORAL_TABLET | ORAL | 0 refills | Status: DC

## 2022-02-24 MED ORDER — OXYCODONE-ACETAMINOPHEN 5-325 MG PO TABS: 1.0000 | ORAL_TABLET | Freq: Four times a day (QID) | ORAL | 0 refills | Status: AC | PRN

## 2022-02-24 NOTE — Progress Notes (Signed)
Subjective:    [name], female    DOB: 05-Apr-1955, 66 y.o.   MRN: [id number]  HPI Patient is a very pleasant 66 year old Caucasian female who fell 3 weeks ago.  She was outside with some wet leaves.  She landed directly on her coccyx.  Today she is point tender over her sacrum.  She is also extremely tender to palpation over the spinous process of the L5 vertebrae.  The patient literally jumps when I palpate this area.  She has bruising.  She also reports bilateral sciatica.  She states that she has nervelike burning searing pain radiating in both gluteus muscles with Valsalva, coughing, straining to have a bowel movement, and standing.  In April of this year, I performed an MRI of her lumbar spine.  At that time the patient was found to have severe spinal stenosis at L4-L5:  L4-L5: Disc uncovering and mild disc bulging eccentric to the right. Severe bilateral facet arthropathy. Severe spinal canal and right greater than left lateral recess stenosis. Mild right neuroforaminal stenosis.   Patient denies any saddle anesthesia or bowel or bladder incontinence however she does report severe pain  Past Medical History:  Diagnosis Date   Allergy    Anxiety    Phreesia 06/04/2020   Arthritis    Phreesia 07/08/2020   Chronic pain    back and knee   Colitis 09/2012   infectious vs inflammatory.    Colon stricture [facility])    Depression    Depression    Phreesia 06/04/2020   Depression    Phreesia 07/08/2020   GERD (gastroesophageal reflux disease)    Phreesia 06/04/2020   Hyperlipidemia    Obesity    Osteoarthritis    Osteopenia    Recurrent cold sores    Seasonal allergies    Syncope and collapse 11/30/2012   Normal EEG.  Vaso vagal syncope   Past Surgical History:  Procedure Laterality Date   BIOPSY  02/21/2020   Procedure: BIOPSY;  Surgeon: Gatha Mayer, MD;  Location: [place];  Service: Endoscopy;;   COLON RESECTION N/A 10/14/2013   Procedure:  LAPAROSCOPIC MOBILIZATION OF SPLENIC FLEXURE, LAPAROSCOPIC EXTENDED LEFT COLECTOMY;  Surgeon: [name], MD;  Location: [place];  Service: General;  Laterality: N/A;   COLON SURGERY N/A    Phreesia 06/04/2020   COLONOSCOPY N/A 10/12/2013   Procedure: COLONOSCOPY;  Surgeon: [name], MD;  Location: [place];  Service: Endoscopy;  Laterality: N/A;   COLONOSCOPY WITH PROPOFOL N/A 02/21/2020   Procedure: COLONOSCOPY WITH PROPOFOL;  Surgeon: Gatha Mayer, MD;  Location[facility] ENDOSCOPY;  Service: Endoscopy;  Laterality: N/A;   DILATION AND CURETTAGE, DIAGNOSTIC / THERAPEUTIC  1987   FRACTURE SURGERY N/A    Phreesia 06/04/2020   JOINT REPLACEMENT N/A    Phreesia 06/04/2020   OPEN REDUCTION INTERNAL FIXATION (ORIF) DISTAL RADIAL FRACTURE Right 08/08/2014   Procedure: OPEN REDUCTION INTERNAL FIXATION (ORIF) DISTAL RADIAL FRACTURE;  Surgeon: Roseanne Kaufman, MD;  Location: [place];  Service: Orthopedics;  Laterality: Right;  DVR Crosslock, MD available sometime around 1430-1500   ORIF ANKLE FRACTURE Right 11/10/2019   Procedure: POSSIBLEOPEN REDUCTION INTERNAL FIXATION (ORIF) ANKLE FRACTURE;  Surgeon: Shona Needles, MD;  Location: [place];  Service: Orthopedics;  Laterality: Right;   ORIF WRIST FRACTURE Left 11/10/2019   Procedure: OPEN REDUCTION INTERNAL FIXATION (ORIF) WRIST FRACTURE;  Surgeon: Shona Needles, MD;  Location: [place];  Service: Orthopedics;  Laterality: Left;  TONSILLECTOMY     TOTAL KNEE ARTHROPLASTY Right 08/05/2016   TOTAL KNEE ARTHROPLASTY Right 08/05/2016   Procedure: RIGHT TOTAL KNEE ARTHROPLASTY;  Surgeon: Mcarthur Rossetti, MD;  Location: [place];  Service: Orthopedics;  Laterality: Right;   Current Outpatient Medications on File Prior to Visit  Medication Sig Dispense Refill   acetaminophen (TYLENOL) 650 MG CR tablet Take 1,300 mg by mouth at bedtime.     atorvastatin (LIPITOR) 40 MG tablet TAKE 1 TABLET EVERY DAY 90 tablet 2   azithromycin (ZITHROMAX) 250 MG tablet 2  tabs poqday1, 1 tab poqday 2-5 6 tablet 0   b complex vitamins capsule Take 1 capsule by mouth daily.     Calcium Carb-Cholecalciferol (CALCIUM 600+D3 PO) Take 1 tablet by mouth in the morning and at bedtime.     Cholecalciferol (VITAMIN D-3) 25 MCG (1000 UT) CAPS Take 1,000 Units by mouth daily with breakfast.     COVID-19 mRNA vaccine 2023-2024 (COMIRNATY) syringe Inject into the muscle. 0.3 mL 0   cyclobenzaprine (FLEXERIL) 10 MG tablet TAKE 1 TABLET THREE TIMES DAILY AS NEEDED FOR MUSCLE SPASM(S) 270 tablet 1   docusate sodium (COLACE) 100 MG capsule Take 1 capsule (100 mg total) by mouth 2 (two) times daily. 60 capsule 0   influenza vaccine adjuvanted (FLUAD QUADRIVALENT) 0.5 ML injection Inject into the muscle. 0.5 mL 0   linaclotide (LINZESS) 290 MCG CAPS capsule Take 1 capsule (290 mcg total) by mouth daily 30 minutes before breakfast 30 capsule 3   loratadine (CLARITIN) 10 MG tablet Take 10 mg by mouth at bedtime.     Melatonin 10 MG TABS Take 10 mg by mouth at bedtime.     Multiple Vitamins-Minerals (ONE-A-DAY WOMENS PO) Take 1 tablet by mouth daily with breakfast.     Omega-3 Fatty Acids (FISH OIL) 1000 MG CAPS Take 1,000 mg by mouth daily after breakfast.     pantoprazole (PROTONIX) 40 MG tablet TAKE 1 TABLET EVERY DAY 90 tablet 2   polyethylene glycol (MIRALAX / GLYCOLAX) 17 g packet Take 17 g by mouth daily.     predniSONE (DELTASONE) 20 MG tablet 3 tabs poqday 1-2, 2 tabs poqday 3-4, 1 tab poqday 5-6 12 tablet 0   RSV vaccine recomb adjuvanted (AREXVY) 120 MCG/0.5ML injection Inject into the muscle. 0.5 mL 0   venlafaxine XR (EFFEXOR-XR) 75 MG 24 hr capsule TAKE 2 CAPSULES EVERY DAY WITH BREAKFAST 180 capsule 3   Wheat Dextrin (BENEFIBER) POWD Take by mouth. 2 teaspoons daily     No current facility-administered medications on file prior to visit.   Allergies  Allergen Reactions   Ciprofloxacin Itching, Dermatitis and Rash   Penicillins Rash and Other (See Comments)    Has  patient had a PCN reaction causing immediate rash, facial/tongue/throat swelling, SOB or lightheadedness with hypotension: Yes Has patient had a PCN reaction causing severe rash involving mucus membranes or skin necrosis: Unknown Has patient had a PCN reaction that required hospitalization: No Has patient had a PCN reaction occurring within the last 10 years: No If all of the above answers are "NO", then may proceed with Cephalosporin use. Childhood reaction.   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    Employer:  [employer]    Comment: LPN  Tobacco Use   Smoking status: Former    Types: Cigarettes    Quit date: 10/27/1993    Years since quitting: 28.3   Smokeless tobacco: Never  Vaping Use   Vaping Use: Never used  Substance and Sexual Activity   Alcohol use: Yes    Comment: one drink per week   Drug use: No   Sexual activity: Not Currently  Other Topics Concern   Not on file  Social History Narrative   Not on file   Social Determinants of Health   Financial Resource Strain: Not on file  Food Insecurity: Not on file  Transportation Needs: Not on file  Physical Activity: Not on file  Stress: Not on file  Social Connections: Not on file  Intimate Partner Violence: Not on file   Family History  Problem Relation Age of Onset   Stroke Mother    Arthritis Mother    Thyroid disease Mother    Cancer Father    Arthritis Sister    Thyroid disease Sister    Cancer Maternal Grandmother    Stroke Maternal Grandfather    Cancer Maternal Grandfather    Colon cancer Maternal Grandfather    Cancer Paternal Grandmother    Heart disease Paternal Grandmother    Colon cancer Paternal Grandmother    Stroke Paternal Grandfather     Arthritis Sister    Obesity Sister    Sudden death Neg Hx    Hypertension Neg Hx    Hyperlipidemia Neg Hx    Heart attack Neg Hx    Diabetes Neg Hx    Rectal cancer Neg Hx    Stomach cancer Neg Hx    Esophageal cancer Neg Hx       Review of Systems  All other systems reviewed and are negative.      Objective:   Physical Exam Vitals reviewed.  Constitutional:      General: She is not in acute distress.    Appearance: She is obese. She is not ill-appearing, toxic-appearing or diaphoretic.  HENT:     Head: Normocephalic and atraumatic.  Cardiovascular:     Rate and Rhythm: Normal rate and regular rhythm.     Pulses: Normal pulses.     Heart sounds: Normal heart sounds. No murmur heard.    No friction rub. No gallop.  Pulmonary:     Effort: Pulmonary effort is normal. No respiratory distress.     Breath sounds: Normal breath sounds. No stridor. No wheezing, rhonchi or rales.  Chest:     Chest wall: No tenderness.  Musculoskeletal:     Cervical back: No muscular tenderness.     Lumbar back: Spasms, tenderness and bony tenderness present. Decreased range of motion.       Back:  Skin:    Findings: No lesion or rash.  Neurological:     General: No focal deficit present.     Mental Status: She is alert and oriented to person, place, and time. Mental status is at baseline.     Cranial Nerves: No cranial nerve deficit.     Sensory: No sensory deficit.     Motor: No weakness.     Coordination: Coordination normal.     Gait: Gait normal.     Deep Tendon Reflexes: Reflexes normal.           Assessment & Plan:  Bilateral sciatica - Plan: DG Lumbar Spine Complete Obtain x-ray of  the lumbar spine to rule out any vertebral fractures after the fall.  If there are no vertebral fractures, use prednisone taper pack to treat what I am assuming may be worsening spinal stenosis secondary to an aggravated/herniated disc.  Use Percocet 5/325 1-2 p.o. every 6 hours as needed pain.

## 2022-02-24 NOTE — Telephone Encounter (Signed)
Good afternoon, is someone able to follow up on [employer] assistance program for this patient. At one point she was told that everything had been received and now she is hearing otherwise. Not sure what the issue is. Thanks

## 2022-02-26 ENCOUNTER — Encounter: Payer: Self-pay | Admitting: Family Medicine

## 2022-02-28 ENCOUNTER — Telehealth: Payer: Self-pay | Admitting: Pharmacy Technician

## 2022-02-28 ENCOUNTER — Other Ambulatory Visit (HOSPITAL_COMMUNITY): Payer: Self-pay

## 2022-02-28 NOTE — Telephone Encounter (Signed)
Called and spoke with pt about concerns of delay in [employer] PAP for Linzess. Apologized for the delay and explain the process that is taking place. Called [employer] and per recording information needed from page 5 (provider section) was missing. Filled out all fillable spots that I was able to, and fax to [phone] for provider to complete presctiption section, sign, and date. Provided fax number to Dina Rich [phone] to fax completed form.

## 2022-03-05 NOTE — Telephone Encounter (Signed)
Called and checked the status of pt application for [employer]. Spoke with rep Patty, and they have received all that is needed, however, they severally behind this renewal season. Expected delays have been more than the 7-10 days for processing that they usually allow.

## 2022-03-05 NOTE — Telephone Encounter (Signed)
Per phone note 02/28/22, prior auth team was notified that fax was not received & the complete form from 01/09/22 was complete and refaxed. Pending prior auth team response.

## 2022-03-18 NOTE — Telephone Encounter (Signed)
Good afternoon, has there been any update?

## 2022-03-20 ENCOUNTER — Other Ambulatory Visit: Payer: Self-pay | Admitting: Family Medicine

## 2022-03-20 ENCOUNTER — Encounter: Payer: Self-pay | Admitting: Family Medicine

## 2022-03-20 MED ORDER — OXYCODONE-ACETAMINOPHEN 5-325 MG PO TABS: 1.0000 | ORAL_TABLET | Freq: Four times a day (QID) | ORAL | 0 refills | Status: AC | PRN

## 2022-03-21 ENCOUNTER — Encounter: Payer: Self-pay | Admitting: Family Medicine

## 2022-03-26 ENCOUNTER — Telehealth: Payer: Self-pay | Admitting: Pharmacy Technician

## 2022-03-26 NOTE — Telephone Encounter (Signed)
Noted, thanks!

## 2022-03-26 NOTE — Telephone Encounter (Signed)
Call placed to [place] regarding LINZESS. An approval for  patient assistance is on file from 1.12.24 to 12.31.24. Pt has been notified and shipment is set to arrive with in 7-10 business days.   Phone number: [phone]

## 2022-03-26 NOTE — Telephone Encounter (Signed)
PAP has been approved. Waiting for approval letter via fax to add to patient chart. Pt has been notified and should receive shipment in 7-10 business days.

## 2022-04-01 ENCOUNTER — Encounter: Payer: Self-pay | Admitting: Family Medicine

## 2022-04-01 LAB — HM MAMMOGRAPHY

## 2022-05-02 ENCOUNTER — Encounter: Payer: Self-pay | Admitting: Family Medicine

## 2022-05-02 LAB — HM MAMMOGRAPHY

## 2022-05-13 ENCOUNTER — Encounter: Payer: Self-pay | Admitting: Family Medicine

## 2022-05-21 ENCOUNTER — Encounter: Payer: Self-pay | Admitting: Family Medicine

## 2022-05-22 ENCOUNTER — Other Ambulatory Visit: Payer: Self-pay | Admitting: Family Medicine

## 2022-05-22 MED ORDER — OXYCODONE-ACETAMINOPHEN 5-325 MG PO TABS: 1.0000 | ORAL_TABLET | ORAL | 0 refills | Status: AC | PRN

## 2022-06-27 ENCOUNTER — Telehealth: Payer: Self-pay | Admitting: Family Medicine

## 2022-06-27 NOTE — Telephone Encounter (Signed)
Contacted Monique Gonzalez to schedule their annual wellness visit. Appointment made for 07/10/2022.  Thank you,  Judeth Cornfield,  AMB Clinical Support [facility] AWV Program Direct Dial ??[phone]

## 2022-06-27 NOTE — Telephone Encounter (Signed)
Called patient to schedule Medicare Annual Wellness Visit (AWV). No voicemail available to leave a message.  Last date of AWV: 06/06/2021   Please schedule an appointment at any time with Toni Amend, [facility] .  If any questions, please contact me at [phone].  Thank you,  Judeth Cornfield,  AMB Clinical Support [facility] AWV Program Direct Dial ??[phone]

## 2022-06-29 ENCOUNTER — Encounter: Payer: Self-pay | Admitting: Family Medicine

## 2022-06-30 ENCOUNTER — Other Ambulatory Visit: Payer: Self-pay | Admitting: Family Medicine

## 2022-06-30 ENCOUNTER — Other Ambulatory Visit: Payer: Self-pay | Admitting: Neurosurgery

## 2022-06-30 MED ORDER — OXYCODONE-ACETAMINOPHEN 5-325 MG PO TABS: 1.0000 | ORAL_TABLET | ORAL | 0 refills | Status: AC | PRN

## 2022-07-01 ENCOUNTER — Telehealth: Payer: Self-pay

## 2022-07-01 NOTE — Telephone Encounter (Signed)
[facility] Pharmacist called in with questions about pt refill request for this med oxyCODONE-acetaminophen (PERCOCET/ROXICET) 5-325 MG tablet [[id number]]. Pharmacist is requesting  a cb from nurse to help provide info needed to fill this prescription please. Please advise.  Cb#: Natalia Leatherwood, Teacher, early years/pre at [employer] [phone]

## 2022-07-04 ENCOUNTER — Ambulatory Visit (INDEPENDENT_AMBULATORY_CARE_PROVIDER_SITE_OTHER): Payer: Medicare Other | Admitting: Family Medicine

## 2022-07-04 ENCOUNTER — Encounter: Payer: Self-pay | Admitting: Family Medicine

## 2022-07-04 VITALS — BP 128/72 | HR 88 | Temp 97.5°F | Ht 70.0 in | Wt 296.0 lb

## 2022-07-04 DIAGNOSIS — Z0000 Encounter for general adult medical examination without abnormal findings: Secondary | ICD-10-CM

## 2022-07-04 DIAGNOSIS — Z780 Asymptomatic menopausal state: Secondary | ICD-10-CM

## 2022-07-04 DIAGNOSIS — Z0001 Encounter for general adult medical examination with abnormal findings: Secondary | ICD-10-CM | POA: Diagnosis not present

## 2022-07-04 DIAGNOSIS — M25562 Pain in left knee: Secondary | ICD-10-CM | POA: Diagnosis not present

## 2022-07-04 DIAGNOSIS — E7800 Pure hypercholesterolemia, unspecified: Secondary | ICD-10-CM | POA: Diagnosis not present

## 2022-07-04 DIAGNOSIS — M48061 Spinal stenosis, lumbar region without neurogenic claudication: Secondary | ICD-10-CM

## 2022-07-04 DIAGNOSIS — G8929 Other chronic pain: Secondary | ICD-10-CM

## 2022-07-04 DIAGNOSIS — R7301 Impaired fasting glucose: Secondary | ICD-10-CM

## 2022-07-04 LAB — CBC WITH DIFFERENTIAL/PLATELET
Absolute Monocytes: 469 cells/uL (ref 200–950)
Eosinophils Relative: 0.6 %
Lymphs Abs: 1173 cells/uL (ref 850–3900)
MCH: 29.1 pg (ref 27.0–33.0)
MCHC: 33.3 g/dL (ref 32.0–36.0)
MCV: 87.3 fL (ref 80.0–100.0)
MPV: 9.8 fL (ref 7.5–12.5)
Monocytes Relative: 9.2 %
Neutro Abs: 3397 cells/uL (ref 1500–7800)
RDW: 13.4 % (ref 11.0–15.0)
Total Lymphocyte: 23 %
WBC: 5.1 10*3/uL (ref 3.8–10.8)

## 2022-07-04 MED ORDER — PREDNISONE 20 MG PO TABS
ORAL_TABLET | ORAL | 0 refills | Status: DC
Start: 2022-07-04 — End: 2022-10-21

## 2022-07-04 NOTE — Progress Notes (Signed)
Subjective:    [name], female    DOB: 08-12-1955, 67 y.o.   MRN: [id number]  HPI Patient is a very pleasant 67 year old Caucasian female who is here today for her annual wellness visit.  She has a history of ischemic colitis on 2 separate occasions in 2015 and 2021.  Both were thought to be due to hypotensive episodes.  Patient has really been suffering ever since December with her back.  She is scheduled to have surgery May 14.  She is having a fusion of L4-L5 performed at that time.  She continues to suffer with neuropathic pain in her extremity.  She is taking oxycodone as needed for pain.  She also twisted her left knee in December when she fell.  She is complaining of medial joint line pain in the left knee.  She denies any laxity to varus or valgus stress.  She denies any locking or catching.  She has not had an x-ray.  Today she is requesting an injection in her left knee but she is also complaining of severe pain in her lower back.  We discussed a cortisone injection in the knee versus a trial of prednisone taper pack to see if we can help with both at least by time until the surgery.  She is interested in trying prednisone.  Her immunizations are all up-to-date Immunization History  Administered Date(s) Administered   COVID-19, mRNA, vaccine(Comirnaty)12 years and older 02/04/2022   Fluad Quad(high Dose 65+) 04/14/2019, 12/23/2019, 01/11/2021, 12/02/2021   Influenza,inj,Quad PF,6+ Mos 11/11/2012, 11/17/2013, 12/22/2014, 12/06/2015, 12/09/2016   PFIZER Comirnaty(Gray Top)Covid-19 Tri-Sucrose Vaccine 10/02/2020   PFIZER(Purple Top)SARS-COV-2 Vaccination 04/18/2019, 05/13/2019, 01/27/2020   Pfizer Covid-19 Vaccine Bivalent Booster 74yrs & up 01/11/2021   Pneumococcal Conjugate-13 09/17/2018   Pneumococcal Polysaccharide-23 06/05/2020   Respiratory Syncytial Virus Vaccine,Recomb Aduvanted(Arexvy) 02/04/2022   Tdap 05/23/2009, 06/05/2020   Zoster Recombinat (Shingrix)  05/09/2018   Zoster, Live 11/19/2015  Her colonoscopy was in 2015 and is due again next year.  Her mammogram was performed in February and is up-to-date.  She does not require Pap smear due to her age. Past Medical History:  Diagnosis Date   Allergy    Anxiety    Phreesia 06/04/2020   Arthritis    Phreesia 07/08/2020   Chronic pain    back and knee   Colitis 09/2012   infectious vs inflammatory.    Colon stricture [facility])    Depression    Depression    Phreesia 06/04/2020   Depression    Phreesia 07/08/2020   GERD (gastroesophageal reflux disease)    Phreesia 06/04/2020   Hyperlipidemia    Obesity    Osteoarthritis    Osteopenia    Recurrent cold sores    Seasonal allergies    Syncope and collapse 11/30/2012   Normal EEG.  Vaso vagal syncope   Past Surgical History:  Procedure Laterality Date   BIOPSY  02/21/2020   Procedure: BIOPSY;  Surgeon: Iva Boop, MD;  Location: [facility] ENDOSCOPY;  Service: Endoscopy;;   COLON RESECTION N/A 10/14/2013   Procedure: LAPAROSCOPIC MOBILIZATION OF SPLENIC FLEXURE, LAPAROSCOPIC EXTENDED LEFT COLECTOMY;  Surgeon: Atilano Ina, MD;  Location: [facility] OR;  Service: General;  Laterality: N/A;   COLON SURGERY N/A    Phreesia 06/04/2020   COLONOSCOPY N/A 10/12/2013   Procedure: COLONOSCOPY;  Surgeon: Beverley Fiedler, MD;  Location: [facility] ENDOSCOPY;  Service: Endoscopy;  Laterality: N/A;   COLONOSCOPY WITH PROPOFOL N/A 02/21/2020   Procedure:  COLONOSCOPY WITH PROPOFOL;  Surgeon: Iva Boop, MD;  Location: [facility] ENDOSCOPY;  Service: Endoscopy;  Laterality: N/A;   DILATION AND CURETTAGE, DIAGNOSTIC / THERAPEUTIC  1987   FRACTURE SURGERY N/A    Phreesia 06/04/2020   JOINT REPLACEMENT N/A    Phreesia 06/04/2020   OPEN REDUCTION INTERNAL FIXATION (ORIF) DISTAL RADIAL FRACTURE Right 08/08/2014   Procedure: OPEN REDUCTION INTERNAL FIXATION (ORIF) DISTAL RADIAL FRACTURE;  Surgeon: Dominica Severin, MD;  Location: [facility] OR;  Service: Orthopedics;  Laterality: Right;   DVR Crosslock, MD available sometime around 1430-1500   ORIF ANKLE FRACTURE Right 11/10/2019   Procedure: POSSIBLEOPEN REDUCTION INTERNAL FIXATION (ORIF) ANKLE FRACTURE;  Surgeon: Roby Lofts, MD;  Location: [facility] OR;  Service: Orthopedics;  Laterality: Right;   ORIF WRIST FRACTURE Left 11/10/2019   Procedure: OPEN REDUCTION INTERNAL FIXATION (ORIF) WRIST FRACTURE;  Surgeon: Roby Lofts, MD;  Location: [facility] OR;  Service: Orthopedics;  Laterality: Left;   TONSILLECTOMY     TOTAL KNEE ARTHROPLASTY Right 08/05/2016   TOTAL KNEE ARTHROPLASTY Right 08/05/2016   Procedure: RIGHT TOTAL KNEE ARTHROPLASTY;  Surgeon: Kathryne Hitch, MD;  Location: [facility] OR;  Service: Orthopedics;  Laterality: Right;   Current Outpatient Medications on File Prior to Visit  Medication Sig Dispense Refill   acetaminophen (TYLENOL) 650 MG CR tablet Take 1,300 mg by mouth at bedtime.     atorvastatin (LIPITOR) 40 MG tablet TAKE 1 TABLET EVERY DAY 90 tablet 2   b complex vitamins capsule Take 1 capsule by mouth daily.     Calcium Carb-Cholecalciferol (CALCIUM 600+D3 PO) Take 1 tablet by mouth in the morning and at bedtime.     Cholecalciferol (VITAMIN D-3) 25 MCG (1000 UT) CAPS Take 1,000 Units by mouth daily with breakfast.     cyclobenzaprine (FLEXERIL) 10 MG tablet TAKE 1 TABLET THREE TIMES DAILY AS NEEDED FOR MUSCLE SPASM(S) 270 tablet 1   docusate sodium (COLACE) 100 MG capsule Take 1 capsule (100 mg total) by mouth 2 (two) times daily. 60 capsule 0   influenza vaccine adjuvanted (FLUAD QUADRIVALENT) 0.5 ML injection Inject into the muscle. 0.5 mL 0   linaclotide (LINZESS) 290 MCG CAPS capsule Take 1 capsule (290 mcg total) by mouth daily 30 minutes before breakfast 30 capsule 3   loratadine (CLARITIN) 10 MG tablet Take 10 mg by mouth at bedtime.     Melatonin 10 MG TABS Take 10 mg by mouth at bedtime.     Multiple Vitamins-Minerals (ONE-A-DAY WOMENS PO) Take 1 tablet by mouth daily with breakfast.     Omega-3 Fatty  Acids (FISH OIL) 1000 MG CAPS Take 1,000 mg by mouth daily after breakfast.     oxyCODONE-acetaminophen (PERCOCET/ROXICET) 5-325 MG tablet Take 1 tablet by mouth every 4 (four) hours as needed for severe pain. 60 tablet 0   pantoprazole (PROTONIX) 40 MG tablet TAKE 1 TABLET EVERY DAY 90 tablet 2   polyethylene glycol (MIRALAX / GLYCOLAX) 17 g packet Take 17 g by mouth daily.     RSV vaccine recomb adjuvanted (AREXVY) 120 MCG/0.5ML injection Inject into the muscle. 0.5 mL 0   venlafaxine XR (EFFEXOR-XR) 75 MG 24 hr capsule TAKE 2 CAPSULES EVERY DAY WITH BREAKFAST 180 capsule 3   Wheat Dextrin (BENEFIBER) POWD Take by mouth. 2 teaspoons daily     No current facility-administered medications on file prior to visit.   Allergies  Allergen Reactions   Ciprofloxacin Itching, Dermatitis and Rash   Penicillins Rash and Other (See Comments)  Has patient had a PCN reaction causing immediate rash, facial/tongue/throat swelling, SOB or lightheadedness with hypotension: Yes Has patient had a PCN reaction causing severe rash involving mucus membranes or skin necrosis: Unknown Has patient had a PCN reaction that required hospitalization: No Has patient had a PCN reaction occurring within the last 10 years: No If all of the above answers are "NO", then may proceed with Cephalosporin use. Childhood reaction.   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    Employer: [employer]    Comment: LPN  Tobacco Use   Smoking status: Former    Types: Cigarettes    Quit date: 10/27/1993    Years since quitting: 28.7   Smokeless tobacco: Never  Vaping Use   Vaping Use: Never used  Substance and Sexual Activity   Alcohol use: Yes    Comment: one drink per week    Drug use: No   Sexual activity: Not Currently  Other Topics Concern   Not on file  Social History Narrative   Not on file   Social Determinants of Health   Financial Resource Strain: Not on file  Food Insecurity: Not on file  Transportation Needs: Not on file  Physical Activity: Not on file  Stress: Not on file  Social Connections: Not on file  Intimate Partner Violence: Not on file   Family History  Problem Relation Age of Onset   Stroke Mother    Arthritis Mother    Thyroid disease Mother    Cancer Father    Arthritis Sister    Thyroid disease Sister    Cancer Maternal Grandmother    Stroke Maternal Grandfather    Cancer Maternal Grandfather    Colon cancer Maternal Grandfather    Cancer Paternal Grandmother    Heart disease Paternal Grandmother    Colon cancer Paternal Grandmother    Stroke Paternal Grandfather    Arthritis Sister    Obesity Sister    Sudden death Neg Hx    Hypertension Neg Hx    Hyperlipidemia Neg Hx    Heart attack Neg Hx    Diabetes Neg Hx    Rectal cancer Neg Hx    Stomach cancer Neg Hx    Esophageal cancer Neg Hx       Review of Systems  All other systems reviewed and are negative.      Objective:   Physical Exam Vitals reviewed.  Constitutional:      General: She is not in acute distress.    Appearance: She is obese. She is not ill-appearing, toxic-appearing or diaphoretic.  HENT:     Head: Normocephalic and atraumatic.     Right Ear: Tympanic membrane, ear canal and external ear normal. There is no impacted cerumen.     Left Ear: Tympanic membrane, ear canal and external ear normal. There is no impacted cerumen.     Nose: Nose normal. No congestion or rhinorrhea.     Mouth/Throat:     Mouth: Mucous membranes are moist.     Pharynx: Oropharynx is clear. No oropharyngeal exudate or posterior oropharyngeal erythema.  Eyes:     General: No scleral icterus.       Right eye: No discharge.  Left eye: No discharge.      Extraocular Movements: Extraocular movements intact.     Conjunctiva/sclera: Conjunctivae normal.     Pupils: Pupils are equal, round, and reactive to light.  Neck:     Vascular: No carotid bruit.  Cardiovascular:     Rate and Rhythm: Normal rate and regular rhythm.     Pulses: Normal pulses.     Heart sounds: Normal heart sounds. No murmur heard.    No friction rub. No gallop.  Pulmonary:     Effort: Pulmonary effort is normal. No respiratory distress.     Breath sounds: Normal breath sounds. No stridor. No wheezing, rhonchi or rales.  Chest:     Chest wall: No tenderness.  Abdominal:     General: Bowel sounds are normal. There is no distension.     Palpations: Abdomen is soft. There is no mass.     Tenderness: There is no abdominal tenderness. There is no right CVA tenderness, left CVA tenderness, guarding or rebound.     Hernia: No hernia is present.  Musculoskeletal:     Cervical back: Normal range of motion. No muscular tenderness.     Lumbar back: Spasms and tenderness present. No bony tenderness. Decreased range of motion.     Left knee: No effusion. Decreased range of motion. Tenderness present over the medial joint line.     Right lower leg: No edema.     Left lower leg: No edema.  Lymphadenopathy:     Cervical: No cervical adenopathy.  Skin:    General: Skin is warm.     Coloration: Skin is not jaundiced or pale.     Findings: No bruising, erythema, lesion or rash.  Neurological:     General: No focal deficit present.     Mental Status: She is alert and oriented to person, place, and time. Mental status is at baseline.     Cranial Nerves: No cranial nerve deficit.     Sensory: No sensory deficit.     Motor: No weakness.     Coordination: Coordination normal.     Gait: Gait normal.     Deep Tendon Reflexes: Reflexes abnormal.  Psychiatric:        Mood and Affect: Mood normal.        Behavior: Behavior normal.        Thought Content: Thought content normal.         Judgment: Judgment normal.           Assessment & Plan:  General medical exam  Pure hypercholesterolemia - Plan: CBC with Differential/Platelet, COMPLETE METABOLIC PANEL WITH GFR, Lipid panel  Spinal stenosis at L4-L5 level  Postmenopausal estrogen deficiency  Chronic pain of left knee - Plan: DG Knee Complete 4 Views Left  Morbid obesity (HCC) - Plan: Insulin, Free (Bioactive), Hemoglobin A1c  Impaired fasting glucose - Plan: Hemoglobin A1c Patient's cancer screening is up-to-date as are her immunizations.  I am concerned about her prediabetes and insulin resistance.  I will check an A1c along with an insulin level.  If elevated, perhaps we can get her insurance to cover a GLP-1 agonist like Ozempic to reduce her cardiovascular mortality and help the patient lose weight.  I will check a CBC a CMP and a lipid panel.  I will obtain an x-ray of the left knee to evaluate further.  I will start her on a prednisone taper pack to help treat the spinal stenosis as well as help treat the left  knee pain.  If not improving by next week she could return for cortisone injection in the left knee.  Await the results of the fasting lab work.

## 2022-07-05 LAB — LIPID PANEL
Cholesterol: 128 mg/dL (ref <200)
HDL: 57 mg/dL (ref >=50)
LDL Cholesterol (Calc): 52 mg/dL (calc)
Non-HDL Cholesterol (Calc): 71 mg/dL (calc) (ref <130)
Total CHOL/HDL Ratio: 2.2 (calc) (ref <5.0)
Triglycerides: 108 mg/dL (ref <150)

## 2022-07-05 LAB — CBC WITH DIFFERENTIAL/PLATELET
Basophils Absolute: 31 cells/uL (ref 0–200)
Basophils Relative: 0.6 %
Eosinophils Absolute: 31 cells/uL (ref 15–500)
HCT: 37.8 % (ref 35.0–45.0)
Hemoglobin: 12.6 g/dL (ref 11.7–15.5)
Neutrophils Relative %: 66.6 %
Platelets: 245 10*3/uL (ref 140–400)
RBC: 4.33 10*6/uL (ref 3.80–5.10)

## 2022-07-05 LAB — HEMOGLOBIN A1C
Hgb A1c MFr Bld: 5.5 % of total Hgb (ref <5.7)
Mean Plasma Glucose: 111 mg/dL
eAG (mmol/L): 6.2 mmol/L

## 2022-07-05 LAB — COMPLETE METABOLIC PANEL WITH GFR
AG Ratio: 2.4 (calc) (ref 1.0–2.5)
ALT: 12 U/L (ref 6–29)
AST: 15 U/L (ref 10–35)
Albumin: 4.5 g/dL (ref 3.6–5.1)
Alkaline phosphatase (APISO): 56 U/L (ref 37–153)
BUN: 17 mg/dL (ref 7–25)
CO2: 29 mmol/L (ref 20–32)
Calcium: 9.8 mg/dL (ref 8.6–10.4)
Chloride: 102 mmol/L (ref 98–110)
Creat: 0.91 mg/dL (ref 0.50–1.05)
Globulin: 1.9 g/dL (calc) (ref 1.9–3.7)
Glucose, Bld: 102 mg/dL — ABNORMAL HIGH (ref 65–99)
Potassium: 4.3 mmol/L (ref 3.5–5.3)
Sodium: 141 mmol/L (ref 135–146)
Total Bilirubin: 0.4 mg/dL (ref 0.2–1.2)
Total Protein: 6.4 g/dL (ref 6.1–8.1)
eGFR: 69 mL/min/{1.73_m2} (ref >=60)

## 2022-07-08 ENCOUNTER — Ambulatory Visit (HOSPITAL_BASED_OUTPATIENT_CLINIC_OR_DEPARTMENT_OTHER)
Admission: RE | Admit: 2022-07-08 | Discharge: 2022-07-08 | Disposition: A | Discharge status: Home / Self Care | Payer: Medicare Other | Source: Ambulatory Visit | Attending: Family Medicine | Admitting: Family Medicine

## 2022-07-08 DIAGNOSIS — M25562 Pain in left knee: Secondary | ICD-10-CM | POA: Diagnosis present

## 2022-07-08 DIAGNOSIS — G8929 Other chronic pain: Secondary | ICD-10-CM | POA: Diagnosis present

## 2022-07-09 ENCOUNTER — Other Ambulatory Visit: Payer: Self-pay | Admitting: Neurosurgery

## 2022-07-09 LAB — INSULIN, FREE (BIOACTIVE): Insulin, Free: 6.4 u[IU]/mL (ref 1.5–14.9)

## 2022-07-10 ENCOUNTER — Ambulatory Visit (INDEPENDENT_AMBULATORY_CARE_PROVIDER_SITE_OTHER): Payer: Medicare Other

## 2022-07-10 VITALS — Ht 70.0 in | Wt 296.0 lb

## 2022-07-10 DIAGNOSIS — Z0000 Encounter for general adult medical examination without abnormal findings: Secondary | ICD-10-CM | POA: Diagnosis not present

## 2022-07-10 NOTE — Addendum Note (Signed)
Addended by: Lynnea Ferrier T on: 07/10/2022 06:14 AM   Modules accepted: Level of Service

## 2022-07-10 NOTE — Patient Instructions (Signed)
Monique Gonzalez , Thank you for taking time to come for your Medicare Wellness Visit. I appreciate your ongoing commitment to your health goals. Please review the following plan we discussed and let me know if I can assist you in the future.   These are the goals we discussed:  Goals      Recover from upcoming surgery and increase physical activity        This is a list of the screening recommended for you and due dates:  Health Maintenance  Topic Date Due   Zoster (Shingles) Vaccine (2 of 2) 07/04/2018   Flu Shot  10/09/2022   Medicare Annual Wellness Visit  07/10/2023   Mammogram  05/02/2024   Colon Cancer Screening  02/20/2030   DTaP/Tdap/Td vaccine (3 - Td or Tdap) 06/06/2030   Pneumonia Vaccine  Completed   DEXA scan (bone density measurement)  Completed   COVID-19 Vaccine  Completed   Hepatitis C Screening: USPSTF Recommendation to screen - Ages 58-79 yo.  Completed   HPV Vaccine  Aged Out    Advanced directives: Please bring a copy of your health care power of attorney and living will to the office to be added to your chart at your convenience.   Conditions/risks identified: Aim for 30 minutes of exercise or brisk walking, 6-8 glasses of water, and 5 servings of fruits and vegetables each day.  Next appointment: Follow up in one year for your annual wellness visit    Preventive Care 65 Years and Older, Female Preventive care refers to lifestyle choices and visits with your health care provider that can promote health and wellness. What does preventive care include? A yearly physical exam. This is also called an annual well check. Dental exams once or twice a year. Routine eye exams. Ask your health care provider how often you should have your eyes checked. Personal lifestyle choices, including: Daily care of your teeth and gums. Regular physical activity. Eating a healthy diet. Avoiding tobacco and drug use. Limiting alcohol use. Practicing safe sex. Taking low-dose  aspirin every day. Taking vitamin and mineral supplements as recommended by your health care provider. What happens during an annual well check? The services and screenings done by your health care provider during your annual well check will depend on your age, overall health, lifestyle risk factors, and family history of disease. Counseling  Your health care provider may ask you questions about your: Alcohol use. Tobacco use. Drug use. Emotional well-being. Home and relationship well-being. Sexual activity. Eating habits. History of falls. Memory and ability to understand (cognition). Work and work Astronomer. Reproductive health. Screening  You may have the following tests or measurements: Height, weight, and BMI. Blood pressure. Lipid and cholesterol levels. These may be checked every 5 years, or more frequently if you are over 45 years old. Skin check. Lung cancer screening. You may have this screening every year starting at age 71 if you have a 30-pack-year history of smoking and currently smoke or have quit within the past 15 years. Fecal occult blood test (FOBT) of the stool. You may have this test every year starting at age 90. Flexible sigmoidoscopy or colonoscopy. You may have a sigmoidoscopy every 5 years or a colonoscopy every 10 years starting at age 1. Hepatitis C blood test. Hepatitis B blood test. Sexually transmitted disease (STD) testing. Diabetes screening. This is done by checking your blood sugar (glucose) after you have not eaten for a while (fasting). You may have this done every 1-3  years. Bone density scan. This is done to screen for osteoporosis. You may have this done starting at age 25. Mammogram. This may be done every 1-2 years. Talk to your health care provider about how often you should have regular mammograms. Talk with your health care provider about your test results, treatment options, and if necessary, the need for more tests. Vaccines  Your  health care provider may recommend certain vaccines, such as: Influenza vaccine. This is recommended every year. Tetanus, diphtheria, and acellular pertussis (Tdap, Td) vaccine. You may need a Td booster every 10 years. Zoster vaccine. You may need this after age 10. Pneumococcal 13-valent conjugate (PCV13) vaccine. One dose is recommended after age 17. Pneumococcal polysaccharide (PPSV23) vaccine. One dose is recommended after age 75. Talk to your health care provider about which screenings and vaccines you need and how often you need them. This information is not intended to replace advice given to you by your health care provider. Make sure you discuss any questions you have with your health care provider. Document Released: 03/23/2015 Document Revised: 11/14/2015 Document Reviewed: 12/26/2014 Elsevier Interactive Patient Education  2017 [place] Prevention in the Home Falls can cause injuries. They can happen to people of all ages. There are many things you can do to make your home safe and to help prevent falls. What can I do on the outside of my home? Regularly fix the edges of walkways and driveways and fix any cracks. Remove anything that might make you trip as you walk through a door, such as a raised step or threshold. Trim any bushes or trees on the path to your home. Use bright outdoor lighting. Clear any walking paths of anything that might make someone trip, such as rocks or tools. Regularly check to see if handrails are loose or broken. Make sure that both sides of any steps have handrails. Any raised decks and porches should have guardrails on the edges. Have any leaves, snow, or ice cleared regularly. Use sand or salt on walking paths during winter. Clean up any spills in your garage right away. This includes oil or grease spills. What can I do in the bathroom? Use night lights. Install grab bars by the toilet and in the tub and shower. Do not use towel bars as  grab bars. Use non-skid mats or decals in the tub or shower. If you need to sit down in the shower, use a plastic, non-slip stool. Keep the floor dry. Clean up any water that spills on the floor as soon as it happens. Remove soap buildup in the tub or shower regularly. Attach bath mats securely with double-sided non-slip rug tape. Do not have throw rugs and other things on the floor that can make you trip. What can I do in the bedroom? Use night lights. Make sure that you have a light by your bed that is easy to reach. Do not use any sheets or blankets that are too big for your bed. They should not hang down onto the floor. Have a firm chair that has side arms. You can use this for support while you get dressed. Do not have throw rugs and other things on the floor that can make you trip. What can I do in the kitchen? Clean up any spills right away. Avoid walking on wet floors. Keep items that you use a lot in easy-to-reach places. If you need to reach something above you, use a strong step stool that has a grab  bar. Keep electrical cords out of the way. Do not use floor polish or wax that makes floors slippery. If you must use wax, use non-skid floor wax. Do not have throw rugs and other things on the floor that can make you trip. What can I do with my stairs? Do not leave any items on the stairs. Make sure that there are handrails on both sides of the stairs and use them. Fix handrails that are broken or loose. Make sure that handrails are as long as the stairways. Check any carpeting to make sure that it is firmly attached to the stairs. Fix any carpet that is loose or worn. Avoid having throw rugs at the top or bottom of the stairs. If you do have throw rugs, attach them to the floor with carpet tape. Make sure that you have a light switch at the top of the stairs and the bottom of the stairs. If you do not have them, ask someone to add them for you. What else can I do to help prevent  falls? Wear shoes that: Do not have high heels. Have rubber bottoms. Are comfortable and fit you well. Are closed at the toe. Do not wear sandals. If you use a stepladder: Make sure that it is fully opened. Do not climb a closed stepladder. Make sure that both sides of the stepladder are locked into place. Ask someone to hold it for you, if possible. Clearly mark and make sure that you can see: Any grab bars or handrails. First and last steps. Where the edge of each step is. Use tools that help you move around (mobility aids) if they are needed. These include: Canes. Walkers. Scooters. Crutches. Turn on the lights when you go into a dark area. Replace any light bulbs as soon as they burn out. Set up your furniture so you have a clear path. Avoid moving your furniture around. If any of your floors are uneven, fix them. If there are any pets around you, be aware of where they are. Review your medicines with your doctor. Some medicines can make you feel dizzy. This can increase your chance of falling. Ask your doctor what other things that you can do to help prevent falls. This information is not intended to replace advice given to you by your health care provider. Make sure you discuss any questions you have with your health care provider. Document Released: 12/21/2008 Document Revised: 08/02/2015 Document Reviewed: 03/31/2014 Elsevier Interactive Patient Education  2017 [employer].

## 2022-07-10 NOTE — Progress Notes (Signed)
Subjective:   Monique Gonzalez is a 67 y.o. female who presents for Medicare Annual (Subsequent) preventive examination.  I connected with  Monique Gonzalez on 07/10/22 by a audio enabled telemedicine application and verified that I am speaking with the correct person using two identifiers.  Patient Location: Home  Provider Location: Office/Clinic  I discussed the limitations of evaluation and management by telemedicine. The patient expressed understanding and agreed to proceed.  Review of Systems     Cardiac Risk Factors include: advanced age (>3men, >63 women);hypertension;dyslipidemia;sedentary lifestyle;obesity (BMI >30kg/m2)     Objective:    Today's Vitals   07/10/22 1038  Weight: 296 lb (134.3 kg)  Height: 5\' 10"  (1.778 m)   Body mass index is 42.47 kg/m.     07/10/2022   10:46 AM 08/13/2021   10:11 AM 06/05/2020    8:33 AM 02/20/2020    1:31 AM 02/20/2020    1:00 AM 02/19/2020   10:58 AM 11/09/2019    4:41 PM  Advanced Directives  Does Patient Have a Medical Advance Directive? No No No No No No No  Would patient like information on creating a medical advance directive? No - Patient declined Yes (MAU/Ambulatory/Procedural Areas - Information given) No - Patient declined No - Patient declined  No - Patient declined No - Patient declined    Current Medications (verified) Outpatient Encounter Medications as of 07/10/2022  Medication Sig   acetaminophen (TYLENOL) 650 MG CR tablet Take 1,300 mg by mouth at bedtime.   atorvastatin (LIPITOR) 40 MG tablet TAKE 1 TABLET EVERY DAY   b complex vitamins capsule Take 1 capsule by mouth daily.   Calcium Carb-Cholecalciferol (CALCIUM 600+D3 PO) Take 1 tablet by mouth in the morning and at bedtime.   Cholecalciferol (VITAMIN D-3) 25 MCG (1000 UT) CAPS Take 1,000 Units by mouth daily with breakfast.   cyclobenzaprine (FLEXERIL) 10 MG tablet TAKE 1 TABLET THREE TIMES DAILY AS NEEDED FOR MUSCLE SPASM(S)   docusate sodium (COLACE) 100 MG  capsule Take 1 capsule (100 mg total) by mouth 2 (two) times daily.   linaclotide (LINZESS) 290 MCG CAPS capsule Take 1 capsule (290 mcg total) by mouth daily 30 minutes before breakfast   loratadine (CLARITIN) 10 MG tablet Take 10 mg by mouth at bedtime.   Melatonin 10 MG TABS Take 10 mg by mouth at bedtime.   Multiple Vitamins-Minerals (ONE-A-DAY WOMENS PO) Take 1 tablet by mouth daily with breakfast.   Omega-3 Fatty Acids (FISH OIL) 1000 MG CAPS Take 1,000 mg by mouth daily after breakfast.   oxyCODONE-acetaminophen (PERCOCET/ROXICET) 5-325 MG tablet Take 1 tablet by mouth every 4 (four) hours as needed for severe pain.   pantoprazole (PROTONIX) 40 MG tablet TAKE 1 TABLET EVERY DAY   polyethylene glycol (MIRALAX / GLYCOLAX) 17 g packet Take 17 g by mouth daily.   predniSONE (DELTASONE) 20 MG tablet 3 tabs poqday 1-2, 2 tabs poqday 3-4, 1 tab poqday 5-6   venlafaxine XR (EFFEXOR-XR) 75 MG 24 hr capsule TAKE 2 CAPSULES EVERY DAY WITH BREAKFAST   Wheat Dextrin (BENEFIBER) POWD Take by mouth. 2 teaspoons daily   [DISCONTINUED] influenza vaccine adjuvanted (FLUAD QUADRIVALENT) 0.5 ML injection Inject into the muscle.   [DISCONTINUED] RSV vaccine recomb adjuvanted (AREXVY) 120 MCG/0.5ML injection Inject into the muscle.   No facility-administered encounter medications on file as of 07/10/2022.    Allergies (verified) Ciprofloxacin, Penicillins, and Sulfa antibiotics   History: Past Medical History:  Diagnosis Date   Allergy  Anxiety    Phreesia 06/04/2020   Arthritis    Phreesia 07/08/2020   Chronic pain    back and knee   Colitis 09/2012   infectious vs inflammatory.    Colon stricture [facility])    Depression    Depression    Phreesia 06/04/2020   Depression    Phreesia 07/08/2020   GERD (gastroesophageal reflux disease)    Phreesia 06/04/2020   Hyperlipidemia    Obesity    Osteoarthritis    Osteopenia    Recurrent cold sores    Seasonal allergies    Syncope and collapse  11/30/2012   Normal EEG.  Vaso vagal syncope   Past Surgical History:  Procedure Laterality Date   BIOPSY  02/21/2020   Procedure: BIOPSY;  Surgeon: Iva Boop, MD;  Location: [facility] ENDOSCOPY;  Service: Endoscopy;;   COLON RESECTION N/A 10/14/2013   Procedure: LAPAROSCOPIC MOBILIZATION OF SPLENIC FLEXURE, LAPAROSCOPIC EXTENDED LEFT COLECTOMY;  Surgeon: Atilano Ina, MD;  Location: [facility] OR;  Service: General;  Laterality: N/A;   COLON SURGERY N/A    Phreesia 06/04/2020   COLONOSCOPY N/A 10/12/2013   Procedure: COLONOSCOPY;  Surgeon: Beverley Fiedler, MD;  Location: [facility] ENDOSCOPY;  Service: Endoscopy;  Laterality: N/A;   COLONOSCOPY WITH PROPOFOL N/A 02/21/2020   Procedure: COLONOSCOPY WITH PROPOFOL;  Surgeon: Iva Boop, MD;  Location: [facility] ENDOSCOPY;  Service: Endoscopy;  Laterality: N/A;   DILATION AND CURETTAGE, DIAGNOSTIC / THERAPEUTIC  1987   FRACTURE SURGERY N/A    Phreesia 06/04/2020   JOINT REPLACEMENT N/A    Phreesia 06/04/2020   OPEN REDUCTION INTERNAL FIXATION (ORIF) DISTAL RADIAL FRACTURE Right 08/08/2014   Procedure: OPEN REDUCTION INTERNAL FIXATION (ORIF) DISTAL RADIAL FRACTURE;  Surgeon: Dominica Severin, MD;  Location: [facility] OR;  Service: Orthopedics;  Laterality: Right;  DVR Crosslock, MD available sometime around 1430-1500   ORIF ANKLE FRACTURE Right 11/10/2019   Procedure: POSSIBLEOPEN REDUCTION INTERNAL FIXATION (ORIF) ANKLE FRACTURE;  Surgeon: Roby Lofts, MD;  Location: [facility] OR;  Service: Orthopedics;  Laterality: Right;   ORIF WRIST FRACTURE Left 11/10/2019   Procedure: OPEN REDUCTION INTERNAL FIXATION (ORIF) WRIST FRACTURE;  Surgeon: Roby Lofts, MD;  Location: [facility] OR;  Service: Orthopedics;  Laterality: Left;   TONSILLECTOMY     TOTAL KNEE ARTHROPLASTY Right 08/05/2016   TOTAL KNEE ARTHROPLASTY Right 08/05/2016   Procedure: RIGHT TOTAL KNEE ARTHROPLASTY;  Surgeon: Kathryne Hitch, MD;  Location: [facility] OR;  Service: Orthopedics;  Laterality: Right;   Family History   Problem Relation Age of Onset   Stroke Mother    Arthritis Mother    Thyroid disease Mother    Cancer Father    Arthritis Sister    Thyroid disease Sister    Cancer Maternal Grandmother    Stroke Maternal Grandfather    Cancer Maternal Grandfather    Colon cancer Maternal Grandfather    Cancer Paternal Grandmother    Heart disease Paternal Grandmother    Colon cancer Paternal Grandmother    Stroke Paternal Grandfather    Arthritis Sister    Obesity Sister    Sudden death Neg Hx    Hypertension Neg Hx    Hyperlipidemia Neg Hx    Heart attack Neg Hx    Diabetes Neg Hx    Rectal cancer Neg Hx    Stomach cancer Neg Hx    Esophageal cancer Neg Hx    Social History   Socioeconomic History   Marital status: Divorced    Spouse  name: Not on file   Number of children: 2   Years of education: college   Highest education level: Not on file  Occupational History   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    Employer: [employer]    Comment: LPN  Tobacco Use   Smoking status: Former    Types: Cigarettes    Quit date: 10/27/1993    Years since quitting: 28.7   Smokeless tobacco: Never  Vaping Use   Vaping Use: Never used  Substance and Sexual Activity   Alcohol use: Yes    Comment: one drink per week   Drug use: No   Sexual activity: Not Currently  Other Topics Concern   Not on file  Social History Narrative   Not on file   Social Determinants of Health   Financial Resource Strain: Low Risk  (07/10/2022)   Overall Financial Resource Strain (CARDIA)    Difficulty of Paying Living Expenses: Not very hard  Food Insecurity: No Food Insecurity (07/10/2022)   Hunger Vital Sign    Worried About Running Out of Food in the Last Year: Never true    Ran Out of Food in the Last Year: Never true  Transportation Needs: No Transportation Needs (07/10/2022)   PRAPARE - Administrator, Civil Service (Medical): No    Lack of Transportation (Non-Medical): No  Physical  Activity: Inactive (07/10/2022)   Exercise Vital Sign    Days of Exercise per Week: 0 days    Minutes of Exercise per Session: 0 min  Stress: Stress Concern Present (07/10/2022)   [employer] of Occupational Health - Occupational Stress Questionnaire    Feeling of Stress : Rather much  Social Connections: Unknown (07/10/2022)   Social Connection and Isolation Panel [NHANES]    Frequency of Communication with Friends and Family: More than three times a week    Frequency of Social Gatherings with Friends and Family: Patient declined    Attends Religious Services: Patient declined    Database administrator or Organizations: Yes    Attends Banker Meetings: Never    Marital Status: Divorced    Tobacco Counseling Counseling given: Not Answered   Clinical Intake:  Pre-visit preparation completed: Yes  Pain : No/denies pain     Diabetes: No  How often do you need to have someone help you when you read instructions, pamphlets, or other written materials from your doctor or pharmacy?: 1 - Never  Diabetic?No   Interpreter Needed?: No  Information entered by :: Kandis Fantasia LPN   Activities of Daily Living    07/10/2022   10:45 AM 07/06/2022   11:51 AM  In your present state of health, do you have any difficulty performing the following activities:  Hearing? 0 0  Vision? 0 0  Difficulty concentrating or making decisions? 0 0  Walking or climbing stairs? 1 1  Dressing or bathing? 1 1  Doing errands, shopping? 1 1  Preparing Food and eating ? N N  Using the Toilet? N N  In the past six months, have you accidently leaked urine? Y Y  Do you have problems with loss of bowel control? Y Y  Managing your Medications? N N  Managing your Finances? N N  Housekeeping or managing your Housekeeping? Malvin Johns    Patient Care Team: Donita Brooks, MD as PCP - General (Family Medicine) Lisbeth Renshaw, MD as Consulting Physician (Neurosurgery) Unk Lightning,  [place] as Physician Assistant (Gastroenterology)  Indicate any recent Medical Services you may have received from other than [facility] providers in the past year (date may be approximate).     Assessment:   This is a routine wellness examination for Kealy.  Hearing/Vision screen Hearing Screening - Comments:: Denies hearing difficulties   Vision Screening - Comments:: No vision problems; will schedule routine eye exam soon    Dietary issues and exercise activities discussed: Current Exercise Habits: The patient does not participate in regular exercise at present   Goals Addressed             This Visit's Progress    Recover from upcoming surgery and increase physical activity         Depression Screen    07/10/2022   10:51 AM 02/24/2022    4:12 PM 01/03/2022    8:55 AM 06/06/2021    8:32 AM 06/05/2020    8:30 AM 09/17/2018    4:03 PM 12/22/2016    8:03 AM  PHQ 2/9 Scores  PHQ - 2 Score 0 3 0 2 6 6 6   PHQ- 9 Score  14  3 18 21 13     Fall Risk    07/10/2022   10:45 AM 07/06/2022   11:51 AM 02/24/2022    3:59 PM 01/03/2022    8:55 AM 06/06/2021    8:31 AM  Fall Risk   Falls in the past year? 1 1 1  0 0  Number falls in past yr: 0 0 1 0   Injury with Fall? 1 1 1  0 0  Risk for fall due to : Impaired balance/gait;Impaired mobility;History of fall(s)  History of fall(s);Impaired balance/gait;Orthopedic patient No Fall Risks   Follow up Education provided;Falls prevention discussed;Falls evaluation completed  Education provided;Falls prevention discussed Falls prevention discussed     FALL RISK PREVENTION PERTAINING TO THE HOME:  Any stairs in or around the home? No  If so, are there any without handrails? No  Home free of loose throw rugs in walkways, pet beds, electrical cords, etc? Yes  Adequate lighting in your home to reduce risk of falls? Yes   ASSISTIVE DEVICES UTILIZED TO PREVENT FALLS:  Life alert? No  Use of a cane, walker or w/c? No  Grab bars in the bathroom? Yes   Shower chair or bench in shower? No  Elevated toilet seat or a handicapped toilet? Yes   TIMED UP AND GO:  Was the test performed? No . Telephonic visit   Cognitive Function:        07/10/2022   10:46 AM  6CIT Screen  What Year? 0 points  What month? 0 points  What time? 0 points  Count back from 20 0 points  Months in reverse 0 points  Repeat phrase 0 points  Total Score 0 points    Immunizations Immunization History  Administered Date(s) Administered   COVID-19, mRNA, vaccine(Comirnaty)12 years and older 02/04/2022   Fluad Quad(high Dose 65+) 04/14/2019, 12/23/2019, 01/11/2021, 12/02/2021   Influenza,inj,Quad PF,6+ Mos 11/11/2012, 11/17/2013, 12/22/2014, 12/06/2015, 12/09/2016   PFIZER Comirnaty(Gray Top)Covid-19 Tri-Sucrose Vaccine 10/02/2020   PFIZER(Purple Top)SARS-COV-2 Vaccination 04/18/2019, 05/13/2019, 01/27/2020   Pfizer Covid-19 Vaccine Bivalent Booster 105yrs & up 01/11/2021   Pneumococcal Conjugate-13 09/17/2018   Pneumococcal Polysaccharide-23 06/05/2020   Respiratory Syncytial Virus Vaccine,Recomb Aduvanted(Arexvy) 02/04/2022   Tdap 05/23/2009, 06/05/2020   Zoster Recombinat (Shingrix) 05/09/2018   Zoster, Live 11/19/2015    TDAP status: Up to date  Flu Vaccine status: Up to date  Pneumococcal vaccine status: Up to  date  Covid-19 vaccine status: Information provided on how to obtain vaccines.   Qualifies for Shingles Vaccine? Yes   Zostavax completed No   Shingrix Completed?: No.    Education has been provided regarding the importance of this vaccine. Patient has been advised to call insurance company to determine out of pocket expense if they have not yet received this vaccine. Advised may also receive vaccine at local pharmacy or Health Dept. Verbalized acceptance and understanding.  Screening Tests Health Maintenance  Topic Date Due   Zoster Vaccines- Shingrix (2 of 2) 10/10/2022 (Originally 07/04/2018)   INFLUENZA VACCINE  10/09/2022    Medicare Annual Wellness (AWV)  07/10/2023   MAMMOGRAM  05/02/2024   COLONOSCOPY (Pts 45-20yrs Insurance coverage will need to be confirmed)  02/20/2030   DTaP/Tdap/Td (3 - Td or Tdap) 06/06/2030   Pneumonia Vaccine 70+ Years old  Completed   DEXA SCAN  Completed   COVID-19 Vaccine  Completed   Hepatitis C Screening  Completed   HPV VACCINES  Aged Out    Health Maintenance  There are no preventive care reminders to display for this patient.   Colorectal cancer screening: Type of screening: Colonoscopy. Completed 02/21/20. Repeat every 10 years  Mammogram status: Completed 05/02/22. Repeat every year  Bone Density status: Completed 06/20/20. Results reflect: Bone density results: NORMAL. Repeat every 5 years.  Lung Cancer Screening: (Low Dose CT Chest recommended if Age 50-80 years, 30 pack-year currently smoking OR have quit w/in 15years.) does not qualify.   Lung Cancer Screening Referral: n/a  Additional Screening:  Hepatitis C Screening: does qualify; Completed 12/11/16  Vision Screening: Recommended annual ophthalmology exams for early detection of glaucoma and other disorders of the eye. Is the patient up to date with their annual eye exam?  No  Who is the provider or what is the name of the office in which the patient attends annual eye exams? none If pt is not established with a provider, would they like to be referred to a provider to establish care? No .   Dental Screening: Recommended annual dental exams for proper oral hygiene  Community Resource Referral / Chronic Care Management: CRR required this visit?  No   CCM required this visit?  No      Plan:     I have personally reviewed and noted the following in the patient's chart:   Medical and social history Use of alcohol, tobacco or illicit drugs  Current medications and supplements including opioid prescriptions. Patient is currently taking opioid prescriptions. Information provided to patient regarding  non-opioid alternatives. Patient advised to discuss non-opioid treatment plan with their provider. Functional ability and status Nutritional status Physical activity Advanced directives List of other physicians Hospitalizations, surgeries, and ER visits in previous 12 months Vitals Screenings to include cognitive, depression, and falls Referrals and appointments  In addition, I have reviewed and discussed with patient certain preventive protocols, quality metrics, and best practice recommendations. A written personalized care plan for preventive services as well as general preventive health recommendations were provided to patient.     Durwin Nora, [place]   03/15/1094   Due to this being a virtual visit, the after visit summary with patients personalized plan was offered to patient via mail or my-chart.  Patient would like to access on my-chart  Nurse Notes: No concerns

## 2022-07-10 NOTE — Addendum Note (Signed)
Addended by: Lynnea Ferrier T on: 07/10/2022 06:15 AM   Modules accepted: Level of Service

## 2022-07-15 NOTE — Progress Notes (Addendum)
Surgical Instructions    Your procedure is scheduled on Tuesday Jul 22, 2022.  Report to [facility] Main Entrance "A" at 8:15 A.M., then check in with the Admitting office.  Call this number if you have problems the morning of surgery:  [phone]   If you have any questions prior to your surgery date call [phone]: Open Monday-Friday 8am-4pm If you experience any cold or flu symptoms such as cough, fever, chills, shortness of breath, etc. between now and your scheduled surgery, please notify us at the above number     Remember:  Do not eat or drink after midnight the night before your surgery   Take these medicines the morning of surgery with A SIP OF WATER:  acetaminophen (TYLENOL)  atorvastatin (LIPITOR)  pantoprazole (PROTONIX)  venlafaxine XR (EFFEXOR-XR)   If needed:  cyclobenzaprine (FLEXERIL)  oxyCODONE-acetaminophen (PERCOCET/ROXICET)   As of today, STOP taking any Aspirin (unless otherwise instructed by your surgeon) Aleve, Naproxen, Ibuprofen, Motrin, Advil, Goody's, BC's, all herbal medications, fish oil, and all vitamins.  Special instructions:    Oral Hygiene is also important to reduce your risk of infection.  Remember - BRUSH YOUR TEETH THE MORNING OF SURGERY WITH YOUR REGULAR TOOTHPASTE   [place]- Preparing For Surgery  Before surgery, you can play an important role. Because skin is not sterile, your skin needs to be as free of germs as possible. You can reduce the number of germs on your skin by washing with CHG (chlorahexidine gluconate) Soap before surgery.  CHG is an antiseptic cleaner which kills germs and bonds with the skin to continue killing germs even after washing.     Please do not use if you have an allergy to CHG or antibacterial soaps. If your skin becomes reddened/irritated stop using the CHG.  Do not shave (including legs and underarms) for at least 48 hours prior to first CHG shower. It is OK to shave your face.  Day of  Surgery:  Take a shower with CHG soap. Wear Clean/Comfortable clothing the morning of surgery Do not apply any deodorants/lotions.   Remember to brush your teeth WITH YOUR REGULAR TOOTHPASTE.  Do not wear jewelry or makeup. Do not wear lotions, powders, perfumes/cologne or deodorant. Do not shave 48 hours prior to surgery.  Men may shave face and neck. Do not bring valuables to the hospital. Do not wear nail polish, gel polish, artificial nails, or any other type of covering on natural nails (fingers and toes) If you have artificial nails or gel coating that need to be removed by a nail salon, please have this removed prior to surgery. Artificial nails or gel coating may interfere with anesthesia's ability to adequately monitor your vital signs.  [facility] is not responsible for any belongings or valuables.    Do NOT Smoke (Tobacco/Vaping)  24 hours prior to your procedure  If you use a CPAP at night, you may bring your mask for your overnight stay.   Contacts, glasses, hearing aids, dentures or partials may not be worn into surgery, please bring cases for these belongings   For patients admitted to the hospital, discharge time will be determined by your treatment team.   Patients discharged the day of surgery will not be allowed to drive home, and someone needs to stay with them for 24 hours.   SURGICAL WAITING ROOM VISITATION Patients having surgery or a procedure may have no more than 2 support people in the waiting area - these visitors may rotate.  Children under the age of 58 must have an adult with them who is not the patient. If the patient needs to stay at the hospital during part of their recovery, the visitor guidelines for inpatient rooms apply. Pre-op nurse will coordinate an appropriate time for 1 support person to accompany patient in pre-op.  This support person may not rotate.   Please refer to [email or web address]  for the visitor guidelines for Inpatients (after your surgery is over and you are in a regular room).   If you received a COVID test during your pre-op visit, it is requested that you wear a mask when out in public, stay away from anyone that may not be feeling well, and notify your surgeon if you develop symptoms. If you have been in contact with anyone that has tested positive in the last 10 days, please notify your surgeon.    Please read over the following fact sheets that you were given.

## 2022-07-16 ENCOUNTER — Other Ambulatory Visit: Payer: Self-pay

## 2022-07-16 ENCOUNTER — Encounter (HOSPITAL_COMMUNITY)
Admission: RE | Admit: 2022-07-16 | Discharge: 2022-07-16 | Disposition: A | Discharge status: Home / Self Care | Payer: Medicare Other | Source: Ambulatory Visit | Attending: Neurosurgery | Admitting: Neurosurgery

## 2022-07-16 ENCOUNTER — Encounter (HOSPITAL_COMMUNITY): Payer: Self-pay

## 2022-07-16 VITALS — BP 127/79 | HR 85 | Temp 98.6°F | Resp 20 | Ht 70.0 in | Wt 294.4 lb

## 2022-07-16 DIAGNOSIS — Z01818 Encounter for other preprocedural examination: Secondary | ICD-10-CM

## 2022-07-16 DIAGNOSIS — Z01812 Encounter for preprocedural laboratory examination: Secondary | ICD-10-CM | POA: Insufficient documentation

## 2022-07-16 LAB — SURGICAL PCR SCREEN
MRSA, PCR: NEGATIVE
Staphylococcus aureus: NEGATIVE

## 2022-07-16 LAB — TYPE AND SCREEN
ABO/RH(D): AB POS
Antibody Screen: NEGATIVE

## 2022-07-16 NOTE — Progress Notes (Signed)
Surgical Instructions    Your procedure is scheduled on Tuesday Jul 22, 2022.  Report to [facility] Main Entrance "A" at 8:15 A.M., then check in with the Admitting office.  Call this number if you have problems the morning of surgery:  [phone]   If you have any questions prior to your surgery date call [phone]: Open Monday-Friday 8am-4pm If you experience any cold or flu symptoms such as cough, fever, chills, shortness of breath, etc. between now and your scheduled surgery, please notify us at the above number     Remember:  Do not eat or drink after midnight the night before your surgery   Take these medicines the morning of surgery with A SIP OF WATER:  pantoprazole (PROTONIX)  venlafaxine XR (EFFEXOR-XR)   If needed:  cyclobenzaprine (FLEXERIL)  oxyCODONE-acetaminophen (PERCOCET/ROXICET)   As of today, STOP taking any Aspirin (unless otherwise instructed by your surgeon) Aleve, Naproxen, Ibuprofen, Motrin, Advil, Goody's, BC's, all herbal medications, fish oil, and all vitamins.  Special instructions:    Oral Hygiene is also important to reduce your risk of infection.  Remember - BRUSH YOUR TEETH THE MORNING OF SURGERY WITH YOUR REGULAR TOOTHPASTE   [place]- Preparing For Surgery  Before surgery, you can play an important role. Because skin is not sterile, your skin needs to be as free of germs as possible. You can reduce the number of germs on your skin by washing with CHG (chlorahexidine gluconate) Soap before surgery.  CHG is an antiseptic cleaner which kills germs and bonds with the skin to continue killing germs even after washing.     Please do not use if you have an allergy to CHG or antibacterial soaps. If your skin becomes reddened/irritated stop using the CHG.  Do not shave (including legs and underarms) for at least 48 hours prior to first CHG shower. It is OK to shave your face.  Day of Surgery: Take a shower with CHG soap. Wear Clean/Comfortable  clothing the morning of surgery Do not apply any deodorants/lotions.   Remember to brush your teeth WITH YOUR REGULAR TOOTHPASTE.  Do not wear jewelry or makeup. Do not wear lotions, powders, perfumes/cologne or deodorant. Do not shave 48 hours prior to surgery.  Men may shave face and neck. Do not bring valuables to the hospital. Do not wear nail polish, gel polish, artificial nails, or any other type of covering on natural nails (fingers and toes) If you have artificial nails or gel coating that need to be removed by a nail salon, please have this removed prior to surgery. Artificial nails or gel coating may interfere with anesthesia's ability to adequately monitor your vital signs.   is not responsible for any belongings or valuables.    Do NOT Smoke (Tobacco/Vaping)  24 hours prior to your procedure  If you use a CPAP at night, you may bring your mask for your overnight stay.   Contacts, glasses, hearing aids, dentures or partials may not be worn into surgery, please bring cases for these belongings   For patients admitted to the hospital, discharge time will be determined by your treatment team.   Patients discharged the day of surgery will not be allowed to drive home, and someone needs to stay with them for 24 hours.   SURGICAL WAITING ROOM VISITATION Patients having surgery or a procedure may have no more than 2 support people in the waiting area - these visitors may rotate.   Children under the age of  16 must have an adult with them who is not the patient. If the patient needs to stay at the hospital during part of their recovery, the visitor guidelines for inpatient rooms apply. Pre-op nurse will coordinate an appropriate time for 1 support person to accompany patient in pre-op.  This support person may not rotate.   Please refer to [email or web address] for the visitor guidelines for Inpatients (after your surgery is  over and you are in a regular room).   If you received a COVID test during your pre-op visit, it is requested that you wear a mask when out in public, stay away from anyone that may not be feeling well, and notify your surgeon if you develop symptoms. If you have been in contact with anyone that has tested positive in the last 10 days, please notify your surgeon.    Please read over the following fact sheets that you were given.

## 2022-07-16 NOTE — Progress Notes (Signed)
PCP - warren pickard Cardiologist - denies  PPM/ICD - denies  Chest x-ray - n/a EKG - n/a Stress Test - denies ECHO - denies Cardiac Cath - denies  Sleep Study - denies   ERAS Protcol -no   COVID TEST- not needed   Anesthesia review: no  Patient denies shortness of breath, fever, cough and chest pain at PAT appointment   All instructions explained to the patient, with a verbal understanding of the material. Patient agrees to go over the instructions while at home for a better understanding. Patient also instructed to self quarantine after being tested for COVID-19. The opportunity to ask questions was provided.

## 2022-07-21 ENCOUNTER — Other Ambulatory Visit: Payer: Self-pay | Admitting: Family Medicine

## 2022-07-21 DIAGNOSIS — G8929 Other chronic pain: Secondary | ICD-10-CM

## 2022-07-21 NOTE — Anesthesia Preprocedure Evaluation (Signed)
Anesthesia Evaluation  Patient identified by MRN, date of birth, ID band Patient awake    Reviewed: Allergy & Precautions, NPO status , Patient's Chart, lab work & pertinent test results  Airway Mallampati: I  TM Distance: >3 FB Neck ROM: Full    Dental no notable dental hx. (+) Teeth Intact, Dental Advisory Given   Pulmonary former smoker   Pulmonary exam normal breath sounds clear to auscultation       Cardiovascular negative cardio ROS Normal cardiovascular exam Rhythm:Regular Rate:Normal     Neuro/Psych   Anxiety Depression     Neuromuscular disease (R Leg radiculopathy)    GI/Hepatic ,GERD  ,,  Endo/Other  negative endocrine ROS    Renal/GU Lab Results      Component                Value               Date                      CREATININE               0.91                07/04/2022                BUN                      17                  07/04/2022                NA                       141                 07/04/2022                K                        4.3                 07/04/2022                CL                       102                 07/04/2022                CO2                      29                  07/04/2022                Musculoskeletal  (+) Arthritis , Osteoarthritis,    Abdominal   Peds  Hematology Lab Results      Component                Value               Date                      WBC  5.1                 07/04/2022                HGB                      12.6                07/04/2022                HCT                      37.8                07/04/2022                MCV                      87.3                07/04/2022                PLT                      245                 07/04/2022              Anesthesia Other Findings All: cipro, pcn, sulfa  Reproductive/Obstetrics negative OB ROS                              Anesthesia Physical Anesthesia Plan  ASA: 2  Anesthesia Plan: General   Post-op Pain Management: Ketamine IV* and Ofirmev IV (intra-op)*   Induction: Intravenous  PONV Risk Score and Plan: 4 or greater and Treatment may vary due to age or medical condition, Ondansetron, Midazolam and Dexamethasone  Airway Management Planned: Oral ETT  Additional Equipment: None  Intra-op Plan:   Post-operative Plan: Extubation in OR  Informed Consent: I have reviewed the patients History and Physical, chart, labs and discussed the procedure including the risks, benefits and alternatives for the proposed anesthesia with the patient or authorized representative who has indicated his/her understanding and acceptance.     Dental advisory given  Plan Discussed with:   Anesthesia Plan Comments:        Anesthesia Quick Evaluation

## 2022-07-22 ENCOUNTER — Ambulatory Visit (HOSPITAL_COMMUNITY): Payer: Medicare Other | Admitting: Anesthesiology

## 2022-07-22 ENCOUNTER — Encounter (HOSPITAL_COMMUNITY): Payer: Self-pay | Admitting: Neurosurgery

## 2022-07-22 ENCOUNTER — Ambulatory Visit (HOSPITAL_BASED_OUTPATIENT_CLINIC_OR_DEPARTMENT_OTHER): Payer: Medicare Other | Admitting: Anesthesiology

## 2022-07-22 ENCOUNTER — Ambulatory Visit (HOSPITAL_COMMUNITY)
Admission: RE | Disposition: A | Discharge status: Home Health | Payer: Self-pay | Source: Home / Self Care | Attending: Neurosurgery

## 2022-07-22 ENCOUNTER — Other Ambulatory Visit: Payer: Self-pay

## 2022-07-22 ENCOUNTER — Observation Stay (HOSPITAL_COMMUNITY)
Admission: RE | Admit: 2022-07-22 | Discharge: 2022-07-23 | Disposition: A | Discharge status: Home Health | Payer: Medicare Other | Attending: Neurosurgery | Admitting: Neurosurgery

## 2022-07-22 ENCOUNTER — Ambulatory Visit (HOSPITAL_COMMUNITY): Payer: Medicare Other

## 2022-07-22 DIAGNOSIS — Z79899 Other long term (current) drug therapy: Secondary | ICD-10-CM | POA: Insufficient documentation

## 2022-07-22 DIAGNOSIS — M5416 Radiculopathy, lumbar region: Secondary | ICD-10-CM | POA: Insufficient documentation

## 2022-07-22 DIAGNOSIS — Z96651 Presence of right artificial knee joint: Secondary | ICD-10-CM | POA: Diagnosis not present

## 2022-07-22 DIAGNOSIS — M4316 Spondylolisthesis, lumbar region: Secondary | ICD-10-CM

## 2022-07-22 DIAGNOSIS — Z87891 Personal history of nicotine dependence: Secondary | ICD-10-CM

## 2022-07-22 DIAGNOSIS — F418 Other specified anxiety disorders: Secondary | ICD-10-CM

## 2022-07-22 DIAGNOSIS — Z981 Arthrodesis status: Secondary | ICD-10-CM | POA: Insufficient documentation

## 2022-07-22 DIAGNOSIS — M48062 Spinal stenosis, lumbar region with neurogenic claudication: Secondary | ICD-10-CM | POA: Insufficient documentation

## 2022-07-22 HISTORY — DX: Spondylolisthesis, lumbar region: M43.16

## 2022-07-22 HISTORY — DX: Arthrodesis status: Z98.1

## 2022-07-22 SURGERY — POSTERIOR LUMBAR FUSION 1 LEVEL
Anesthesia: General | Site: Spine Lumbar

## 2022-07-22 MED ORDER — HYDROMORPHONE HCL 1 MG/ML IJ SOLN: 0.2500 mg | INTRAMUSCULAR | Status: DC | PRN

## 2022-07-22 MED ORDER — CHLORHEXIDINE GLUCONATE CLOTH 2 % EX PADS: 6.0000 | MEDICATED_PAD | Freq: Once | CUTANEOUS | Status: DC

## 2022-07-22 MED ORDER — LACTATED RINGERS IV SOLN: INTRAVENOUS | Status: DC

## 2022-07-22 MED ORDER — PHENOL 1.4 % MT LIQD: 1.0000 | OROMUCOSAL | Status: DC | PRN

## 2022-07-22 MED ORDER — ONDANSETRON HCL 4 MG/2ML IJ SOLN: 4.0000 mg | Freq: Once | INTRAMUSCULAR | Status: DC | PRN

## 2022-07-22 MED ORDER — ONDANSETRON HCL 4 MG/2ML IJ SOLN
INTRAMUSCULAR | Status: AC
  Filled 2022-07-22: qty 2

## 2022-07-22 MED ORDER — AMISULPRIDE (ANTIEMETIC) 5 MG/2ML IV SOLN
10.0000 mg | Freq: Once | INTRAVENOUS | Status: DC | PRN
Start: 2022-07-22 — End: 2022-07-22

## 2022-07-22 MED ORDER — THROMBIN 5000 UNITS EX SOLR
CUTANEOUS | Status: AC
  Filled 2022-07-22: qty 5000

## 2022-07-22 MED ORDER — MIDAZOLAM HCL 2 MG/2ML IJ SOLN
INTRAMUSCULAR | Status: DC | PRN
  Administered 2022-07-22: 2 mg via INTRAVENOUS

## 2022-07-22 MED ORDER — ACETAMINOPHEN 325 MG PO TABS
650.0000 mg | ORAL_TABLET | ORAL | Status: DC | PRN
  Administered 2022-07-22 – 2022-07-23 (×2): 650 mg via ORAL
  Filled 2022-07-22 (×2): qty 2

## 2022-07-22 MED ORDER — METHOCARBAMOL 1000 MG/10ML IJ SOLN: 500.0000 mg | Freq: Four times a day (QID) | INTRAVENOUS | Status: DC | PRN

## 2022-07-22 MED ORDER — ONDANSETRON HCL 4 MG/2ML IJ SOLN
INTRAMUSCULAR | Status: DC | PRN
  Administered 2022-07-22: 4 mg via INTRAVENOUS

## 2022-07-22 MED ORDER — VANCOMYCIN HCL 1250 MG/250ML IV SOLN
1250.0000 mg | Freq: Once | INTRAVENOUS | Status: AC
  Administered 2022-07-22: 1250 mg via INTRAVENOUS
  Filled 2022-07-22: qty 250

## 2022-07-22 MED ORDER — ACETAMINOPHEN 10 MG/ML IV SOLN: 1000.0000 mg | Freq: Once | INTRAVENOUS | Status: DC | PRN

## 2022-07-22 MED ORDER — SUGAMMADEX SODIUM 200 MG/2ML IV SOLN
INTRAVENOUS | Status: DC | PRN
  Administered 2022-07-22: 200 mg via INTRAVENOUS

## 2022-07-22 MED ORDER — LORATADINE 10 MG PO TABS
10.0000 mg | ORAL_TABLET | Freq: Every day | ORAL | Status: DC
  Administered 2022-07-22: 10 mg via ORAL
  Filled 2022-07-22: qty 1

## 2022-07-22 MED ORDER — SODIUM CHLORIDE 0.9 % IV SOLN: INTRAVENOUS | Status: DC

## 2022-07-22 MED ORDER — LIDOCAINE 2% (20 MG/ML) 5 ML SYRINGE
INTRAMUSCULAR | Status: DC | PRN
  Administered 2022-07-22: 60 mg via INTRAVENOUS

## 2022-07-22 MED ORDER — OXYCODONE HCL 5 MG/5ML PO SOLN
5.0000 mg | Freq: Once | ORAL | Status: DC | PRN
Start: 2022-07-22 — End: 2022-07-22

## 2022-07-22 MED ORDER — ONE-A-DAY WOMENS PO TABS: ORAL_TABLET | Freq: Every day | ORAL | Status: DC

## 2022-07-22 MED ORDER — ONDANSETRON HCL 4 MG/2ML IJ SOLN: 4.0000 mg | Freq: Four times a day (QID) | INTRAMUSCULAR | Status: DC | PRN

## 2022-07-22 MED ORDER — DEXAMETHASONE SODIUM PHOSPHATE 10 MG/ML IJ SOLN
INTRAMUSCULAR | Status: AC
  Filled 2022-07-22: qty 1

## 2022-07-22 MED ORDER — BUPIVACAINE HCL (PF) 0.5 % IJ SOLN
INTRAMUSCULAR | Status: AC
  Filled 2022-07-22: qty 30

## 2022-07-22 MED ORDER — ORAL CARE MOUTH RINSE: 15.0000 mL | Freq: Once | OROMUCOSAL | Status: AC

## 2022-07-22 MED ORDER — HYDROMORPHONE HCL 1 MG/ML IJ SOLN
INTRAMUSCULAR | Status: AC
  Filled 2022-07-22: qty 0.5

## 2022-07-22 MED ORDER — EPHEDRINE SULFATE-NACL 50-0.9 MG/10ML-% IV SOSY
PREFILLED_SYRINGE | INTRAVENOUS | Status: DC | PRN
  Administered 2022-07-22: 5 mg via INTRAVENOUS
  Administered 2022-07-22 (×2): 10 mg via INTRAVENOUS

## 2022-07-22 MED ORDER — MORPHINE SULFATE (PF) 2 MG/ML IV SOLN: 2.0000 mg | INTRAVENOUS | Status: DC | PRN

## 2022-07-22 MED ORDER — ONDANSETRON HCL 4 MG/2ML IJ SOLN
4.0000 mg | Freq: Once | INTRAMUSCULAR | Status: DC | PRN
Start: 2022-07-22 — End: 2022-07-22

## 2022-07-22 MED ORDER — ONDANSETRON HCL 4 MG PO TABS: 4.0000 mg | ORAL_TABLET | Freq: Four times a day (QID) | ORAL | Status: DC | PRN

## 2022-07-22 MED ORDER — OXYCODONE HCL 5 MG PO TABS
5.0000 mg | ORAL_TABLET | Freq: Once | ORAL | Status: DC | PRN
Start: 2022-07-22 — End: 2022-07-22

## 2022-07-22 MED ORDER — HYDROMORPHONE HCL 1 MG/ML IJ SOLN
INTRAMUSCULAR | Status: AC
  Filled 2022-07-22: qty 1

## 2022-07-22 MED ORDER — FLUTICASONE PROPIONATE 50 MCG/ACT NA SUSP
2.0000 | Freq: Every day | NASAL | Status: DC
  Administered 2022-07-22: 2 via NASAL
  Filled 2022-07-22: qty 16

## 2022-07-22 MED ORDER — VANCOMYCIN HCL IN DEXTROSE 1-5 GM/200ML-% IV SOLN
1000.0000 mg | INTRAVENOUS | Status: AC
  Administered 2022-07-22: 1000 mg via INTRAVENOUS
  Filled 2022-07-22: qty 200

## 2022-07-22 MED ORDER — LIDOCAINE-EPINEPHRINE 1 %-1:100000 IJ SOLN
INTRAMUSCULAR | Status: DC | PRN
  Administered 2022-07-22: 5 mL

## 2022-07-22 MED ORDER — PROPOFOL 10 MG/ML IV BOLUS
INTRAVENOUS | Status: AC
  Filled 2022-07-22: qty 20

## 2022-07-22 MED ORDER — DEXAMETHASONE SODIUM PHOSPHATE 10 MG/ML IJ SOLN
INTRAMUSCULAR | Status: DC | PRN
  Administered 2022-07-22: 10 mg via INTRAVENOUS

## 2022-07-22 MED ORDER — PHENYLEPHRINE HCL-NACL 20-0.9 MG/250ML-% IV SOLN
INTRAVENOUS | Status: DC | PRN
  Administered 2022-07-22: 25 ug/min via INTRAVENOUS

## 2022-07-22 MED ORDER — FENTANYL CITRATE (PF) 250 MCG/5ML IJ SOLN
INTRAMUSCULAR | Status: AC
  Filled 2022-07-22: qty 5

## 2022-07-22 MED ORDER — 0.9 % SODIUM CHLORIDE (POUR BTL) OPTIME
TOPICAL | Status: DC | PRN
  Administered 2022-07-22: 1000 mL

## 2022-07-22 MED ORDER — OXYCODONE HCL 5 MG PO TABS
10.0000 mg | ORAL_TABLET | ORAL | Status: DC | PRN
  Administered 2022-07-22 – 2022-07-23 (×5): 10 mg via ORAL
  Filled 2022-07-22 (×5): qty 2

## 2022-07-22 MED ORDER — OXYCODONE HCL 5 MG PO TABS: 5.0000 mg | ORAL_TABLET | ORAL | Status: DC | PRN

## 2022-07-22 MED ORDER — BISACODYL 10 MG RE SUPP: 10.0000 mg | Freq: Every day | RECTAL | Status: DC | PRN

## 2022-07-22 MED ORDER — PANTOPRAZOLE SODIUM 40 MG PO TBEC
40.0000 mg | DELAYED_RELEASE_TABLET | Freq: Every day | ORAL | Status: DC
  Administered 2022-07-23: 40 mg via ORAL
  Filled 2022-07-22: qty 1

## 2022-07-22 MED ORDER — LIDOCAINE-EPINEPHRINE 1 %-1:100000 IJ SOLN
INTRAMUSCULAR | Status: AC
  Filled 2022-07-22: qty 1

## 2022-07-22 MED ORDER — ADULT MULTIVITAMIN W/MINERALS CH
1.0000 | ORAL_TABLET | Freq: Every day | ORAL | Status: DC
  Administered 2022-07-23: 1 via ORAL
  Filled 2022-07-22: qty 1

## 2022-07-22 MED ORDER — OXYCODONE HCL 5 MG PO TABS: 5.0000 mg | ORAL_TABLET | Freq: Once | ORAL | Status: DC | PRN

## 2022-07-22 MED ORDER — MENTHOL 3 MG MT LOZG: 1.0000 | LOZENGE | OROMUCOSAL | Status: DC | PRN

## 2022-07-22 MED ORDER — BUPIVACAINE HCL (PF) 0.5 % IJ SOLN
INTRAMUSCULAR | Status: DC | PRN
  Administered 2022-07-22: 5 mL

## 2022-07-22 MED ORDER — MELATONIN 5 MG PO TABS
10.0000 mg | ORAL_TABLET | Freq: Every day | ORAL | Status: DC
  Administered 2022-07-22: 10 mg via ORAL
  Filled 2022-07-22: qty 2

## 2022-07-22 MED ORDER — ACETAMINOPHEN 650 MG RE SUPP: 650.0000 mg | RECTAL | Status: DC | PRN

## 2022-07-22 MED ORDER — LINACLOTIDE 145 MCG PO CAPS
290.0000 ug | ORAL_CAPSULE | Freq: Every day | ORAL | Status: DC
  Administered 2022-07-23: 290 ug via ORAL
  Filled 2022-07-22: qty 2

## 2022-07-22 MED ORDER — SODIUM CHLORIDE 0.9 % IV SOLN: 250.0000 mL | INTRAVENOUS | Status: DC

## 2022-07-22 MED ORDER — THROMBIN 5000 UNITS EX SOLR: OROMUCOSAL | Status: DC | PRN

## 2022-07-22 MED ORDER — SODIUM CHLORIDE 0.9% FLUSH: 3.0000 mL | Freq: Two times a day (BID) | INTRAVENOUS | Status: DC

## 2022-07-22 MED ORDER — HYDROMORPHONE HCL 1 MG/ML IJ SOLN
INTRAMUSCULAR | Status: DC | PRN
  Administered 2022-07-22: .5 mg via INTRAVENOUS

## 2022-07-22 MED ORDER — FENTANYL CITRATE (PF) 250 MCG/5ML IJ SOLN
INTRAMUSCULAR | Status: DC | PRN
  Administered 2022-07-22 (×3): 50 ug via INTRAVENOUS
  Administered 2022-07-22: 100 ug via INTRAVENOUS

## 2022-07-22 MED ORDER — OMEGA-3-ACID ETHYL ESTERS 1 G PO CAPS
1.0000 g | ORAL_CAPSULE | Freq: Two times a day (BID) | ORAL | Status: DC
  Administered 2022-07-22 – 2022-07-23 (×2): 1 g via ORAL
  Filled 2022-07-22 (×2): qty 1

## 2022-07-22 MED ORDER — ACETAMINOPHEN 10 MG/ML IV SOLN
INTRAVENOUS | Status: DC | PRN
  Administered 2022-07-22: 1000 mg via INTRAVENOUS

## 2022-07-22 MED ORDER — KETAMINE HCL 50 MG/ML IJ SOLN
INTRAMUSCULAR | Status: DC | PRN
  Administered 2022-07-22: 10 mg via INTRAMUSCULAR
  Administered 2022-07-22: 20 mg via INTRAMUSCULAR

## 2022-07-22 MED ORDER — PROPOFOL 10 MG/ML IV BOLUS
INTRAVENOUS | Status: DC | PRN
  Administered 2022-07-22: 20 mg via INTRAVENOUS
  Administered 2022-07-22: 140 mg via INTRAVENOUS

## 2022-07-22 MED ORDER — LIDOCAINE 2% (20 MG/ML) 5 ML SYRINGE
INTRAMUSCULAR | Status: AC
  Filled 2022-07-22: qty 5

## 2022-07-22 MED ORDER — SENNA 8.6 MG PO TABS
1.0000 | ORAL_TABLET | Freq: Two times a day (BID) | ORAL | Status: DC
  Administered 2022-07-22 – 2022-07-23 (×2): 8.6 mg via ORAL
  Filled 2022-07-22 (×2): qty 1

## 2022-07-22 MED ORDER — DOCUSATE SODIUM 100 MG PO CAPS
100.0000 mg | ORAL_CAPSULE | Freq: Two times a day (BID) | ORAL | Status: DC
  Administered 2022-07-22 – 2022-07-23 (×2): 100 mg via ORAL
  Filled 2022-07-22 (×2): qty 1

## 2022-07-22 MED ORDER — KETAMINE HCL 50 MG/5ML IJ SOSY
PREFILLED_SYRINGE | INTRAMUSCULAR | Status: AC
  Filled 2022-07-22: qty 5

## 2022-07-22 MED ORDER — ROCURONIUM BROMIDE 10 MG/ML (PF) SYRINGE
PREFILLED_SYRINGE | INTRAVENOUS | Status: AC
  Filled 2022-07-22: qty 10

## 2022-07-22 MED ORDER — ROCURONIUM BROMIDE 10 MG/ML (PF) SYRINGE
PREFILLED_SYRINGE | INTRAVENOUS | Status: DC | PRN
  Administered 2022-07-22 (×3): 10 mg via INTRAVENOUS
  Administered 2022-07-22: 50 mg via INTRAVENOUS
  Administered 2022-07-22: 20 mg via INTRAVENOUS

## 2022-07-22 MED ORDER — VENLAFAXINE HCL ER 75 MG PO CP24
75.0000 mg | ORAL_CAPSULE | Freq: Every day | ORAL | Status: DC
  Administered 2022-07-23: 75 mg via ORAL
  Filled 2022-07-22: qty 1

## 2022-07-22 MED ORDER — METHOCARBAMOL 500 MG PO TABS
500.0000 mg | ORAL_TABLET | Freq: Four times a day (QID) | ORAL | Status: DC | PRN
  Administered 2022-07-22 – 2022-07-23 (×3): 500 mg via ORAL
  Filled 2022-07-22 (×3): qty 1

## 2022-07-22 MED ORDER — SODIUM CHLORIDE 0.9% FLUSH: 3.0000 mL | INTRAVENOUS | Status: DC | PRN

## 2022-07-22 MED ORDER — POLYETHYLENE GLYCOL 3350 17 G PO PACK
17.0000 g | PACK | Freq: Every day | ORAL | Status: DC
  Administered 2022-07-22: 17 g via ORAL
  Filled 2022-07-22: qty 1

## 2022-07-22 MED ORDER — ATORVASTATIN CALCIUM 40 MG PO TABS
40.0000 mg | ORAL_TABLET | Freq: Every day | ORAL | Status: DC
  Administered 2022-07-22 – 2022-07-23 (×2): 40 mg via ORAL
  Filled 2022-07-22 (×2): qty 1

## 2022-07-22 MED ORDER — HYDROMORPHONE HCL 1 MG/ML IJ SOLN
0.2500 mg | INTRAMUSCULAR | Status: DC | PRN
  Administered 2022-07-22 (×2): 0.5 mg via INTRAVENOUS

## 2022-07-22 MED ORDER — CHLORHEXIDINE GLUCONATE 0.12 % MT SOLN
15.0000 mL | Freq: Once | OROMUCOSAL | Status: AC
  Administered 2022-07-22: 15 mL via OROMUCOSAL
  Filled 2022-07-22: qty 15

## 2022-07-22 MED ORDER — MIDAZOLAM HCL 2 MG/2ML IJ SOLN
INTRAMUSCULAR | Status: AC
  Filled 2022-07-22: qty 2

## 2022-07-22 SURGICAL SUPPLY — 71 items
ADH SKN CLS APL DERMABOND .7 (GAUZE/BANDAGES/DRESSINGS) ×1
APL SKNCLS STERI-STRIP NONHPOA (GAUZE/BANDAGES/DRESSINGS)
BAG COUNTER SPONGE SURGICOUNT (BAG) ×1 IMPLANT
BAG SPNG CNTER NS LX DISP (BAG) ×1
BASKET BONE COLLECTION (BASKET) ×1 IMPLANT
BENZOIN TINCTURE PRP APPL 2/3 (GAUZE/BANDAGES/DRESSINGS) IMPLANT
BLADE BONE MILL MEDIUM (MISCELLANEOUS) IMPLANT
BLADE CLIPPER SURG (BLADE) IMPLANT
BLADE SURG 11 STRL SS (BLADE) ×1 IMPLANT
BUR MATCHSTICK NEURO 3.0 LAGG (BURR) ×1 IMPLANT
BUR PRECISION FLUTE 5.0 (BURR) ×1 IMPLANT
CAGE INTERBODY PL SHT 7X22.5 (Plate) IMPLANT
CANISTER SUCT 3000ML PPV (MISCELLANEOUS) ×1 IMPLANT
CNTNR URN SCR LID CUP LEK RST (MISCELLANEOUS) ×1 IMPLANT
CONT SPEC 4OZ STRL OR WHT (MISCELLANEOUS) ×1
COVER BACK TABLE 60X90IN (DRAPES) ×1 IMPLANT
DERMABOND ADVANCED .7 DNX12 (GAUZE/BANDAGES/DRESSINGS) ×1 IMPLANT
DRAPE C-ARM 42X72 X-RAY (DRAPES) ×1 IMPLANT
DRAPE C-ARMOR (DRAPES) ×1 IMPLANT
DRAPE LAPAROTOMY 100X72X124 (DRAPES) ×1 IMPLANT
DRAPE SURG 17X23 STRL (DRAPES) ×1 IMPLANT
DRSG OPSITE POSTOP 4X6 (GAUZE/BANDAGES/DRESSINGS) IMPLANT
DURAPREP 26ML APPLICATOR (WOUND CARE) ×1 IMPLANT
ELECT REM PT RETURN 9FT ADLT (ELECTROSURGICAL) ×1
ELECTRODE REM PT RTRN 9FT ADLT (ELECTROSURGICAL) ×1 IMPLANT
GAUZE 4X4 16PLY ~~LOC~~+RFID DBL (SPONGE) IMPLANT
GAUZE SPONGE 4X4 12PLY STRL (GAUZE/BANDAGES/DRESSINGS) IMPLANT
GLOVE BIO SURGEON STRL SZ7.5 (GLOVE) IMPLANT
GLOVE BIOGEL PI IND STRL 7.5 (GLOVE) ×2 IMPLANT
GLOVE ECLIPSE 7.0 STRL STRAW (GLOVE) ×2 IMPLANT
GLOVE EXAM NITRILE XL STR (GLOVE) IMPLANT
GOWN STRL REUS W/ TWL LRG LVL3 (GOWN DISPOSABLE) ×4 IMPLANT
GOWN STRL REUS W/ TWL XL LVL3 (GOWN DISPOSABLE) IMPLANT
GOWN STRL REUS W/TWL 2XL LVL3 (GOWN DISPOSABLE) IMPLANT
GOWN STRL REUS W/TWL LRG LVL3 (GOWN DISPOSABLE) ×4
GOWN STRL REUS W/TWL XL LVL3 (GOWN DISPOSABLE)
GRAFT BONE PROTEIOS XS 0.5CC (Orthopedic Implant) IMPLANT
HEMOSTAT POWDER KIT SURGIFOAM (HEMOSTASIS) ×1 IMPLANT
KIT BASIN OR (CUSTOM PROCEDURE TRAY) ×1 IMPLANT
KIT POSITION SURG JACKSON T1 (MISCELLANEOUS) ×1 IMPLANT
KIT TURNOVER KIT B (KITS) ×1 IMPLANT
MILL BONE PREP (MISCELLANEOUS) ×1 IMPLANT
NDL HYPO 18GX1.5 BLUNT FILL (NEEDLE) IMPLANT
NDL SPNL 18GX3.5 QUINCKE PK (NEEDLE) IMPLANT
NEEDLE HYPO 18GX1.5 BLUNT FILL (NEEDLE) IMPLANT
NEEDLE HYPO 22GX1.5 SAFETY (NEEDLE) ×1 IMPLANT
NEEDLE SPNL 18GX3.5 QUINCKE PK (NEEDLE) IMPLANT
NS IRRIG 1000ML POUR BTL (IV SOLUTION) ×1 IMPLANT
PACK LAMINECTOMY NEURO (CUSTOM PROCEDURE TRAY) ×1 IMPLANT
PAD ARMBOARD 7.5X6 YLW CONV (MISCELLANEOUS) ×3 IMPLANT
PUTTY GRAFTON DBF 6CC W/DELIVE (Putty) IMPLANT
ROD CC 30MM (Rod) IMPLANT
SCREW SET SOLERA (Screw) ×4 IMPLANT
SCREW SET SOLERA TI (Screw) IMPLANT
SCREW SOLERA 40X6.5XMA NS SPNE (Screw) IMPLANT
SCREW SOLERA 6.5X35 (Screw) IMPLANT
SCREW SOLERA 6.5X40MM (Screw) ×2 IMPLANT
SOL ELECTROSURG ANTI STICK (MISCELLANEOUS)
SOLUTION ELECTROSURG ANTI STCK (MISCELLANEOUS) ×1 IMPLANT
SPIKE FLUID TRANSFER (MISCELLANEOUS) ×1 IMPLANT
SPONGE SURGIFOAM ABS GEL 100 (HEMOSTASIS) IMPLANT
SPONGE T-LAP 4X18 ~~LOC~~+RFID (SPONGE) IMPLANT
STRIP CLOSURE SKIN 1/2X4 (GAUZE/BANDAGES/DRESSINGS) IMPLANT
SUT VIC AB 0 CT1 18XCR BRD8 (SUTURE) ×1 IMPLANT
SUT VIC AB 0 CT1 8-18 (SUTURE) ×2
SUT VICRYL 3-0 RB1 18 ABS (SUTURE) ×1 IMPLANT
SYR 3ML LL SCALE MARK (SYRINGE) ×3 IMPLANT
TOWEL GREEN STERILE (TOWEL DISPOSABLE) ×1 IMPLANT
TOWEL GREEN STERILE FF (TOWEL DISPOSABLE) ×1 IMPLANT
TRAY FOLEY MTR SLVR 16FR STAT (SET/KITS/TRAYS/PACK) ×1 IMPLANT
WATER STERILE IRR 1000ML POUR (IV SOLUTION) ×1 IMPLANT

## 2022-07-22 NOTE — Anesthesia Procedure Notes (Signed)
Procedure Name: Intubation Date/Time: 07/22/2022 1:32 PM  Performed by: Shary Decamp, CRNAPre-anesthesia Checklist: Patient identified, Patient being monitored, Timeout performed, Emergency Drugs available and Suction available Patient Re-evaluated:Patient Re-evaluated prior to induction Oxygen Delivery Method: Circle System Utilized Preoxygenation: Pre-oxygenation with 100% oxygen Induction Type: IV induction Ventilation: Mask ventilation without difficulty Laryngoscope Size: Miller and 2 Grade View: Grade I Tube type: Oral Tube size: 7.0 mm Number of attempts: 1 Airway Equipment and Method: Stylet Placement Confirmation: ETT inserted through vocal cords under direct vision, positive ETCO2 and breath sounds checked- equal and bilateral Secured at: 21 cm Tube secured with: Tape Dental Injury: Teeth and Oropharynx as per pre-operative assessment

## 2022-07-22 NOTE — H&P (Signed)
Chief Complaint   Back and leg pain  History of Present Illness  Monique Gonzalez is a 68 year old woman I am seen in follow-up. We have been managing relatively chronic back and bilateral leg pain related to multifactorial stenosis with spondylolisthesis at L4-5 conservatively. At our last visit I have recommended she attempt another epidural steroid injection which was performed about two months ago. Unfortunately, she notes only a few weeks of improvement, and has had recurrence and intact progression of symptoms. She has therefore elected to proceed with surgical decomrpession/fusion.  Past Medical History   Past Medical History:  Diagnosis Date   Allergy    Anxiety    Phreesia 06/04/2020   Arthritis    Phreesia 07/08/2020   Chronic pain    back and knee   Colitis 09/2012   infectious vs inflammatory.    Colon stricture [facility])    Depression    Depression    Phreesia 06/04/2020   Depression    Phreesia 07/08/2020   GERD (gastroesophageal reflux disease)    Phreesia 06/04/2020   Hyperlipidemia    Obesity    Osteoarthritis    Osteopenia    Recurrent cold sores    Seasonal allergies    Syncope and collapse 11/30/2012   Normal EEG.  Vaso vagal syncope    Past Surgical History   Past Surgical History:  Procedure Laterality Date   BIOPSY  02/21/2020   Procedure: BIOPSY;  Surgeon: Iva Boop, MD;  Location: [facility] ENDOSCOPY;  Service: Endoscopy;;   COLON RESECTION N/A 10/14/2013   Procedure: LAPAROSCOPIC MOBILIZATION OF SPLENIC FLEXURE, LAPAROSCOPIC EXTENDED LEFT COLECTOMY;  Surgeon: Atilano Ina, MD;  Location: [facility] OR;  Service: General;  Laterality: N/A;   COLON SURGERY N/A    Phreesia 06/04/2020   COLONOSCOPY N/A 10/12/2013   Procedure: COLONOSCOPY;  Surgeon: Beverley Fiedler, MD;  Location: [facility] ENDOSCOPY;  Service: Endoscopy;  Laterality: N/A;   COLONOSCOPY WITH PROPOFOL N/A 02/21/2020   Procedure: COLONOSCOPY WITH PROPOFOL;  Surgeon: Iva Boop, MD;  Location: [facility]  ENDOSCOPY;  Service: Endoscopy;  Laterality: N/A;   DILATION AND CURETTAGE, DIAGNOSTIC / THERAPEUTIC  1987   OPEN REDUCTION INTERNAL FIXATION (ORIF) DISTAL RADIAL FRACTURE Right 08/08/2014   Procedure: OPEN REDUCTION INTERNAL FIXATION (ORIF) DISTAL RADIAL FRACTURE;  Surgeon: Dominica Severin, MD;  Location: [facility] OR;  Service: Orthopedics;  Laterality: Right;  DVR Crosslock, MD available sometime around 1430-1500   ORIF ANKLE FRACTURE Right 11/10/2019   Procedure: POSSIBLEOPEN REDUCTION INTERNAL FIXATION (ORIF) ANKLE FRACTURE;  Surgeon: Roby Lofts, MD;  Location: [facility] OR;  Service: Orthopedics;  Laterality: Right;   ORIF WRIST FRACTURE Left 11/10/2019   Procedure: OPEN REDUCTION INTERNAL FIXATION (ORIF) WRIST FRACTURE;  Surgeon: Roby Lofts, MD;  Location: [facility] OR;  Service: Orthopedics;  Laterality: Left;   TONSILLECTOMY     TOTAL KNEE ARTHROPLASTY Right 08/05/2016   Procedure: RIGHT TOTAL KNEE ARTHROPLASTY;  Surgeon: Kathryne Hitch, MD;  Location: [facility] OR;  Service: Orthopedics;  Laterality: Right;    Social History   Social History   Tobacco Use   Smoking status: Former    Types: Cigarettes    Quit date: 10/27/1993    Years since quitting: 28.7   Smokeless tobacco: Never  Vaping Use   Vaping Use: Never used  Substance Use Topics   Alcohol use: Yes    Comment: one drink per week   Drug use: No    Medications   Prior to Admission medications   Medication  Sig Start Date End Date Taking? Authorizing Provider  acetaminophen (TYLENOL) 650 MG CR tablet Take 1,300 mg by mouth at bedtime.   Yes [provider]  atorvastatin (LIPITOR) 40 MG tablet TAKE 1 TABLET EVERY DAY 07/21/22  Yes Donita Brooks, MD  b complex vitamins capsule Take 1 capsule by mouth daily.   Yes [provider]  Calcium Carb-Cholecalciferol (CALCIUM 600+D3 PO) Take 1 tablet by mouth in the morning and at bedtime.   Yes [provider]  Cholecalciferol (VITAMIN D-3) 25 MCG (1000  UT) CAPS Take 1,000 Units by mouth daily with breakfast.   Yes [provider]  cyclobenzaprine (FLEXERIL) 10 MG tablet TAKE 1 TABLET THREE TIMES DAILY AS NEEDED FOR MUSCLE SPASM(S) 07/21/22  Yes Donita Brooks, MD  docusate sodium (COLACE) 100 MG capsule Take 1 capsule (100 mg total) by mouth 2 (two) times daily. 02/22/20  Yes Vann, Jessica U, DO  fluticasone (FLONASE) 50 MCG/ACT nasal spray Place 2 sprays into both nostrils at bedtime.   Yes [provider]  linaclotide Karlene Einstein) 290 MCG CAPS capsule Take 1 capsule (290 mcg total) by mouth daily 30 minutes before breakfast 01/08/22  Yes Lemmon, Violet Baldy, PA  loratadine (CLARITIN) 10 MG tablet Take 10 mg by mouth at bedtime.   Yes [provider]  Melatonin 10 MG TABS Take 10 mg by mouth at bedtime.   Yes [provider]  Multiple Vitamins-Minerals (ONE-A-DAY WOMENS PO) Take 1 tablet by mouth daily with breakfast.   Yes [provider]  Omega-3 Fatty Acids (FISH OIL) 1000 MG CAPS Take 1,000 mg by mouth 2 (two) times daily.   Yes [provider]  oxyCODONE-acetaminophen (PERCOCET/ROXICET) 5-325 MG tablet Take 1 tablet by mouth every 4 (four) hours as needed for severe pain. Patient taking differently: Take 1-2 tablets by mouth every 6 (six) hours as needed for severe pain. 06/30/22 07/30/22 Yes Donita Brooks, MD  pantoprazole (PROTONIX) 40 MG tablet TAKE 1 TABLET EVERY DAY 07/21/22  Yes Donita Brooks, MD  polyethylene glycol (MIRALAX / GLYCOLAX) 17 g packet Take 17 g by mouth daily.   Yes [provider]  venlafaxine XR (EFFEXOR-XR) 75 MG 24 hr capsule TAKE 2 CAPSULES EVERY DAY WITH BREAKFAST 07/21/22  Yes Donita Brooks, MD  Wheat Dextrin (BENEFIBER) POWD Take 1 Dose by mouth daily. 2 teaspoons   Yes [provider]  predniSONE (DELTASONE) 20 MG tablet 3 tabs poqday 1-2, 2 tabs poqday 3-4, 1 tab poqday 5-6 Patient not taking: Reported on 07/15/2022 07/04/22   Donita Brooks, MD    Allergies   Allergies  Allergen Reactions   Ciprofloxacin Itching, Dermatitis and Rash   Penicillins Rash   Sulfa Antibiotics Rash    Review of Systems  ROS  Neurologic Exam  Awake, alert, oriented Memory and concentration grossly intact Speech fluent, appropriate CN grossly intact Motor exam: Upper Extremities Deltoid Bicep Tricep Grip  Right 5/5 5/5 5/5 5/5  Left 5/5 5/5 5/5 5/5   Lower Extremities IP Quad PF DF EHL  Right 5/5 5/5 5/5 5/5 5/5  Left 5/5 5/5 5/5 5/5 5/5   Sensation grossly intact to LT  Imaging  Multifactorial stenosis L4-5 with degenerative spondylolisthesis   Impression  - 67 y.o. female with back and leg pain who has failed reasonable conservative treatment  Plan  - Proceed with L4-5 PLIF  I have reviewed the indications for the procedure as well as the details of the  procedure and the expected postoperative course and recovery at length with the patient in the office. We have also reviewed in detail the risks, benefits, and alternatives to the procedure. All questions were answered and [name] provided informed consent to proceed.  Lisbeth Renshaw, MD [facility]

## 2022-07-22 NOTE — Transfer of Care (Signed)
Immediate Anesthesia Transfer of Care Note  Patient: Monique Gonzalez  Procedure(s) Performed: Posterior Lumbar Interbody Fusion Lumbar Four-Lumbar Five (Spine Lumbar)  Patient Location: PACU  Anesthesia Type:General  Level of Consciousness: drowsy and patient cooperative  Airway & Oxygen Therapy: Patient Spontanous Breathing and Patient connected to face mask oxygen  Post-op Assessment: Report given to RN and Post -op Vital signs reviewed and stable  Post vital signs: Reviewed and stable  Last Vitals:  Vitals Value Taken Time  BP 102/61 07/22/22 1648  Temp    Pulse 77 07/22/22 1649  Resp 13 07/22/22 1649  SpO2 99 % 07/22/22 1649  Vitals shown include unvalidated device data.  Last Pain:  Vitals:   07/22/22 0839  TempSrc:   PainSc: 6          Complications: No notable events documented.

## 2022-07-22 NOTE — Op Note (Signed)
NEUROSURGERY OPERATIVE NOTE   PREOP DIAGNOSIS:  1. Lumbar spondylolisthesis L4-5 2. Lumbar spinal stenosis with neurogenic claudication, L4-5  POSTOP DIAGNOSIS: Same  PROCEDURE: 1. L4 laminectomy with facetectomy for decompression of exiting nerve roots, more than would be required for placement of interbody graft 2. Placement of anterior interbody device - [employer]tronic expandable 7mm short cages x2 3. Posterior non-segmental instrumentation using cortical pedicle screws at L4 - L5 4. Interbody arthrodesis, L4-5 5. Use of locally harvested bone autograft 6. Use of non-structural bone allograft - DBM, proteiOs  SURGEON: Dr. Lisbeth Renshaw, MD  ASSISTANT: Dr. Barnett Abu, MD  ANESTHESIA: General Endotracheal  EBL: 150cc  SPECIMENS: None  DRAINS: None  COMPLICATIONS: None immediate  CONDITION: Hemodynamically stable to PACU  HISTORY: Monique Gonzalez is a 67 y.o. female who has been followed in the outpatient clinic with back and leg pain related to spondylolisthesis and multifactorial stenosis at L4-5. multiple conservative treatments were attempted without significant improvement and we ultimately elected to proceed with surgical decompression and fusion. Risks, benefits, and alternative treatments were reviewed in detail in the office. After all questions were answered, informed consent was obtained and witnessed.  PROCEDURE IN DETAIL: The patient was brought to the operating room via stretcher. After induction of general anesthesia, the patient was positioned on the operative table in the prone position. All pressure points were meticulously padded. Incision was then marked out and prepped and draped in the usual sterile fashion.  After timeout was conducted, skin was infiltrated with local anesthetic.  Spinal needle was introduced in order to identify the surface projection of the L4-5 interspace.  Skin incision was then made sharply and Bovie electrocautery was used to  dissect the subcutaneous tissue until the lumbodorsal fascia was identified and incised. The muscle was then elevated in the subperiosteal plane and the L4 lamina and L4-5 facet complexes were identified. Self-retaining retractors were then placed. Lateral fluoroscopy was taken with a dissector in the L4-5 interspace to confirm our location.  At this point attention was turned to decompression. Complete L4 laminectomy was completed with a high-speed drill and Kerrison punches.  Normal dura was identified.  A ball-tipped dissector was then used to identify the foramina bilaterally.  High-speed drill was used to cut across the pars interarticularis and the inferior articulating process of L4 was removed bilaterally.  The exiting nerve roots and the traversing nerve roots were then identified.  Kerrison punches were used to complete the decompression by removal of the ligamentum flavum, medial, and superior aspects of the L5 superior articulating processes.  This allowed complete decompression of the thecal sac as well as the foramina bilaterally.  I was then able to easily pass a ball dissector in the ventral epidural space and underneath the bilateral 4 and L5 nerves indicating good decompression.   Disc space was then identified, incised bilaterally, and using a combination of shavers, curettes and rongeurs, complete discectomy was completed. Endplates were prepared, and bone harvested during decompression was mixed with DBM and proteiOs and packed into the interspace. A 7mm expandable cage was tapped into place bilaterally. Cages were expanded to 12mm.  Good position was confirmed with fluoroscopy. Cages were then back-filled with more DBM/autograft mix.  At this point, the entry points for bilateral L4 and L5 cortical pedicle screws were identified using standard anatomic landmarks and lateral fluoro. Pilot holes were then drilled and tapped to 6.5 x 40mm. Screws were then placed in L4 and L5. Prebent  lordotic rod was  then sized and placed into the pedicle screws. Set screws were placed and final tightened. Final AP and lateral fluoroscopic images confirmed good position.  Hemostasis was secured and confirmed with bipolar cautery and morcellized gelfoam with thrombin. The wound was then irrigated with copious amounts of antibiotic saline, then closed in standard fashion using a combination of interrupted 0 and 3-0 Vicryl stitches in the muscular, fascial, and subcutaneous layers. Skin was then closed using standard Dermabond. Sterile dressing was then applied. The patient was then transferred to the stretcher, extubated, and taken to the postanesthesia care unit in stable hemodynamic condition.  At the end of the case all sponge, needle, cottonoid, and instrument counts were correct.   Monique Renshaw, MD [facility]

## 2022-07-23 ENCOUNTER — Encounter: Payer: Self-pay | Admitting: Family Medicine

## 2022-07-23 DIAGNOSIS — M4316 Spondylolisthesis, lumbar region: Secondary | ICD-10-CM | POA: Diagnosis not present

## 2022-07-23 MED ORDER — OXYCODONE-ACETAMINOPHEN 5-325 MG PO TABS: 1.0000 | ORAL_TABLET | Freq: Four times a day (QID) | ORAL | 0 refills | Status: DC | PRN

## 2022-07-23 MED ORDER — CYCLOBENZAPRINE HCL 10 MG PO TABS: 10.0000 mg | ORAL_TABLET | Freq: Three times a day (TID) | ORAL | 3 refills | Status: DC | PRN

## 2022-07-23 MED FILL — Thrombin For Soln 5000 Unit: CUTANEOUS | Qty: 5000 | Status: AC

## 2022-07-23 NOTE — TOC Transition Note (Signed)
Transition of Care [facility]) - CM/SW Discharge Note   Patient Details  Name: Monique Gonzalez MRN: [id number] Date of Birth: 09/07/1955  Transition of Care [facility] CM/SW Contact:  Kermit Balo, RN Phone Number: 07/23/2022, 11:05 AM   Clinical Narrative:    Pt is discharging home with home health services through [place]. Information on the AVS. [facility] will contact her for the first home visit.  Pt has support at home and transportation to home.   Final next level of care: Home w Home Health Services Barriers to Discharge: No Barriers Identified   Patient Goals and CMS Choice CMS Medicare.gov Compare Post Acute Care list provided to:: Patient Choice offered to / list presented to : Patient  Discharge Placement                         Discharge Plan and Services Additional resources added to the After Visit Summary for                            [facility] Arranged: PT, OT [facility] Agency: [facility] Date [facility] Agency Contacted: 07/23/22   Representative spoke with at [facility] Agency: Amy  Social Determinants of Health (SDOH) Interventions SDOH Screenings   Food Insecurity: No Food Insecurity (07/10/2022)  Housing: Low Risk  (07/10/2022)  Transportation Needs: No Transportation Needs (07/10/2022)  Utilities: Not At Risk (07/10/2022)  Alcohol Screen: Low Risk  (07/10/2022)  Depression (PHQ2-9): Low Risk  (07/10/2022)  Financial Resource Strain: Low Risk  (07/10/2022)  Physical Activity: Inactive (07/10/2022)  Social Connections: Unknown (07/10/2022)  Stress: Stress Concern Present (07/10/2022)  Tobacco Use: Medium Risk (07/22/2022)     Readmission Risk Interventions     No data to display

## 2022-07-23 NOTE — Discharge Instructions (Signed)
Wound Care Remove dressing in 3 days Leave incision open to air. You may shower. Do not scrub directly on incision.  Do not put any creams, lotions, or ointments on incision. Activity Walk each and every day, increasing distance each day. No lifting greater than 8 lbs.  Avoid bending, arching, and twisting. No driving for 2 weeks; may ride as a passenger locally. If provided with back brace, wear when out of bed.  It is not necessary to wear in bed. Diet Resume your normal diet.   Call Your Doctor If Any of These Occur Redness, drainage, or swelling at the wound.  Temperature greater than 101 degrees. Severe pain not relieved by pain medication. Incision starts to come apart. Follow Up Appt Call  [phone]) for problems.  If you have any hardware placed in your spine, you will need an x-ray before your appointment.  

## 2022-07-23 NOTE — Evaluation (Signed)
Physical Therapy Evaluation  Patient Details Name: Monique Gonzalez MRN: [id number] DOB: [birth year] Today's Date: 07/23/2022  History of Present Illness  Pt is a 67 y/o female presenting on 5/14 for same day L4-5 PLIF. PMH includes: anxiety, arthritis, colitis, depression, obesity, R TKA, ORIF R wrist/L wrist.   Clinical Impression  Pt admitted with above diagnosis. At the time of PT eval, pt was able to demonstrate transfers and ambulation with gross min guard assist and RW for support. Pt was educated on precautions, brace application/wearing schedule, appropriate activity progression, and car transfer. Pt currently with functional limitations due to the deficits listed below (see PT Problem List). Pt will benefit from skilled PT to increase their independence and safety with mobility to allow discharge to the venue listed below.         Recommendations for follow up therapy are one component of a multi-disciplinary discharge planning process, led by the attending physician.  Recommendations may be updated based on patient status, additional functional criteria and insurance authorization.  Follow Up Recommendations       Assistance Recommended at Discharge PRN  Patient can return home with the following  A little help with walking and/or transfers;A little help with bathing/dressing/bathroom;Assistance with cooking/housework;Assist for transportation;Help with stairs or ramp for entrance    Equipment Recommendations None recommended by PT  Recommendations for Other Services       Functional Status Assessment Patient has had a recent decline in their functional status and demonstrates the ability to make significant improvements in function in a reasonable and predictable amount of time.     Precautions / Restrictions Precautions Precautions: Back Precaution Booklet Issued: Yes (comment) Precaution Comments: Reviewed handout with pt and she was cued for precautions during functional  mobility. Required Braces or Orthoses: Spinal Brace Spinal Brace: Lumbar corset Restrictions Weight Bearing Restrictions: No      Mobility  Bed Mobility               General bed mobility comments: Pt was received sitting up in recliner.    Transfers Overall transfer level: Needs assistance Equipment used: Rolling walker (2 wheels) Transfers: Sit to/from Stand Sit to Stand: Min guard           General transfer comment: Pt demonstrated good wide BOS and hand placement on seated surface for safety.    Ambulation/Gait Ambulation/Gait assistance: Min guard Gait Distance (Feet): 500 Feet Assistive device: Rolling walker (2 wheels) Gait Pattern/deviations: Step-through pattern, Decreased stride length, Trunk flexed, Wide base of support Gait velocity: Decreased Gait velocity interpretation: 1.31 - 2.62 ft/sec, indicative of limited community ambulator   General Gait Details: VC's for improved posture, closer walker proximity, and forward gaze. No assist required and no overt LOB noted.  Stairs Stairs: Yes Stairs assistance: Min guard Stair Management: One rail Right, Step to pattern, Sideways Number of Stairs: 10 General stair comments: VC's for sequencing and general safety.  Wheelchair Mobility    Modified Rankin (Stroke Patients Only)       Balance Overall balance assessment: Needs assistance Sitting-balance support: No upper extremity supported, Feet supported Sitting balance-Leahy Scale: Good     Standing balance support: Bilateral upper extremity supported, No upper extremity supported, During functional activity Standing balance-Leahy Scale: Fair Standing balance comment: RW dynamically                             Pertinent Vitals/Pain      Home  Living Family/patient expects to be discharged to:: Private residence Living Arrangements: Other relatives (2 sisters) Available Help at Discharge: Family;Available 24 hours/day Type of  Home: House Home Access: Stairs to enter Entrance Stairs-Rails: None Entrance Stairs-Number of Steps: 1 Alternate Level Stairs-Number of Steps: flight Home Layout: Two level Home Equipment: Shower seat - built in;Toilet riser;Grab bars - tub/shower;Rolling Walker (2 wheels);BSC/3in1;Adaptive equipment      Prior Function Prior Level of Function : Independent/Modified Independent             Mobility Comments: no AD ADLs Comments: managing ADLs, retired IT sales professional Dominance   Dominant Hand: Right    Extremity/Trunk Assessment   Upper Extremity Assessment Upper Extremity Assessment: Defer to OT evaluation    Lower Extremity Assessment Lower Extremity Assessment: Generalized weakness;RLE deficits/detail;LLE deficits/detail RLE Deficits / Details: Decreased strength and muscular endurance consistent with pre-op diagnosis LLE Deficits / Details: Pt reports baseline knee problems    Cervical / Trunk Assessment Cervical / Trunk Assessment: Back Surgery  Communication   Communication: No difficulties  Cognition Arousal/Alertness: Awake/alert Behavior During Therapy: WFL for tasks assessed/performed Overall Cognitive Status: Within Functional Limits for tasks assessed                                          General Comments      Exercises     Assessment/Plan    PT Assessment Patient needs continued PT services  PT Problem List Decreased strength;Decreased activity tolerance;Decreased balance;Decreased mobility;Decreased knowledge of use of DME;Decreased safety awareness;Decreased knowledge of precautions;Pain       PT Treatment Interventions DME instruction;Gait training;Functional mobility training;Stair training;Therapeutic activities;Therapeutic exercise;Balance training;Patient/family education    PT Goals (Current goals can be found in the Care Plan section)  Acute Rehab PT Goals Patient Stated Goal: Home today PT Goal Formulation:  With patient Time For Goal Achievement: 07/30/22 Potential to Achieve Goals: Good    Frequency Min 5X/week     Co-evaluation               AM-PAC PT "6 Clicks" Mobility  Outcome Measure Help needed turning from your back to your side while in a flat bed without using bedrails?: A Little Help needed moving from lying on your back to sitting on the side of a flat bed without using bedrails?: A Little Help needed moving to and from a bed to a chair (including a wheelchair)?: A Little Help needed standing up from a chair using your arms (e.g., wheelchair or bedside chair)?: A Little Help needed to walk in hospital room?: A Little Help needed climbing 3-5 steps with a railing? : A Little 6 Click Score: 18    End of Session Equipment Utilized During Treatment: Gait belt;Back brace Activity Tolerance: Patient tolerated treatment well Patient left: in chair;with call bell/phone within reach Nurse Communication: Mobility status PT Visit Diagnosis: Unsteadiness on feet (R26.81);Pain Pain - part of body:  (back)    Time: 1041-1105 PT Time Calculation (min) (ACUTE ONLY): 24 min   Charges:   PT Evaluation $PT Eval Low Complexity: 1 Low PT Treatments $Gait Training: 8-22 mins        Conni Slipper, PT, DPT Acute Rehabilitation Services Secure Chat Preferred Office: [phone]   Marylynn Pearson 07/23/2022, 11:17 AM

## 2022-07-23 NOTE — Discharge Summary (Signed)
Physician Discharge Summary  Patient ID: ZORANA GARLINGER MRN: [id number] DOB/AGE: [birth year] 67 y.o.  Admit date: 07/22/2022 Discharge date: 07/23/2022  Admission Diagnoses: Lumbar spondylolisthesis and stenosis L4-L5 with radiculopathy  Discharge Diagnoses: Lumbar spondylolisthesis and stenosis L4-L5 with radiculopathy Principal Problem:   Spondylolisthesis of lumbar region   Discharged Condition: good  Hospital Course: Patient was admitted to undergo surgical decompression which she tolerated well she is greatly relieved to have her radicular pain gone  Consults: None  Significant Diagnostic Studies: None  Treatments: surgery: See op note  Discharge Exam: Blood pressure 112/64, pulse 98, temperature 99 F (37.2 C), temperature source Oral, resp. rate 18, height 5\' 10"  (1.778 m), weight 133.5 kg, SpO2 94 %. Incision is clean and dry Station and gait are intact.  Disposition: Discharge disposition: 01-Home or Self Care       Discharge Instructions     Call MD for:  redness, tenderness, or signs of infection (pain, swelling, redness, odor or green/yellow discharge around incision site)   Complete by: As directed    Call MD for:  severe uncontrolled pain   Complete by: As directed    Call MD for:  temperature >100.4   Complete by: As directed    Diet - low sodium heart healthy   Complete by: As directed    Incentive spirometry RT   Complete by: As directed    Increase activity slowly   Complete by: As directed       Allergies as of 07/23/2022       Reactions   Ciprofloxacin Itching, Dermatitis, Rash   Penicillins Rash   Sulfa Antibiotics Rash        Medication List     TAKE these medications    acetaminophen 650 MG CR tablet Commonly known as: TYLENOL Take 1,300 mg by mouth at bedtime.   atorvastatin 40 MG tablet Commonly known as: LIPITOR TAKE 1 TABLET EVERY DAY   b complex vitamins capsule Take 1 capsule by mouth daily.   Benefiber Powd Take  1 Dose by mouth daily. 2 teaspoons   CALCIUM 600+D3 PO Take 1 tablet by mouth in the morning and at bedtime.   cyclobenzaprine 10 MG tablet Commonly known as: FLEXERIL TAKE 1 TABLET THREE TIMES DAILY AS NEEDED FOR MUSCLE SPASM(S) What changed: Another medication with the same name was added. Make sure you understand how and when to take each.   cyclobenzaprine 10 MG tablet Commonly known as: FLEXERIL Take 1 tablet (10 mg total) by mouth 3 (three) times daily as needed for muscle spasms. What changed: You were already taking a medication with the same name, and this prescription was added. Make sure you understand how and when to take each.   docusate sodium 100 MG capsule Commonly known as: COLACE Take 1 capsule (100 mg total) by mouth 2 (two) times daily.   Fish Oil 1000 MG Caps Take 1,000 mg by mouth 2 (two) times daily.   fluticasone 50 MCG/ACT nasal spray Commonly known as: FLONASE Place 2 sprays into both nostrils at bedtime.   linaclotide 290 MCG Caps capsule Commonly known as: LINZESS Take 1 capsule (290 mcg total) by mouth daily 30 minutes before breakfast   loratadine 10 MG tablet Commonly known as: CLARITIN Take 10 mg by mouth at bedtime.   Melatonin 10 MG Tabs Take 10 mg by mouth at bedtime.   ONE-A-DAY WOMENS PO Take 1 tablet by mouth daily with breakfast.   oxyCODONE-acetaminophen 5-325 MG tablet Commonly known  as: PERCOCET/ROXICET Take 1 tablet by mouth every 4 (four) hours as needed for severe pain. What changed:  how much to take when to take this   oxyCODONE-acetaminophen 5-325 MG tablet Commonly known as: Percocet Take 1-2 tablets by mouth every 6 (six) hours as needed for severe pain. What changed: You were already taking a medication with the same name, and this prescription was added. Make sure you understand how and when to take each.   pantoprazole 40 MG tablet Commonly known as: PROTONIX TAKE 1 TABLET EVERY DAY   polyethylene glycol 17 g  packet Commonly known as: MIRALAX / GLYCOLAX Take 17 g by mouth daily.   predniSONE 20 MG tablet Commonly known as: DELTASONE 3 tabs poqday 1-2, 2 tabs poqday 3-4, 1 tab poqday 5-6   venlafaxine XR 75 MG 24 hr capsule Commonly known as: EFFEXOR-XR TAKE 2 CAPSULES EVERY DAY WITH BREAKFAST   Vitamin D-3 25 MCG (1000 UT) Caps Take 1,000 Units by mouth daily with breakfast.         Signed: Shary Key Louine Tenpenny 07/23/2022, 10:09 AM

## 2022-07-23 NOTE — Evaluation (Signed)
Occupational Therapy Evaluation Patient Details Name: Monique Gonzalez MRN: [id number] DOB: 1955/12/31 Today's Date: 07/23/2022   History of Present Illness Pt is a 67 y/o female presenting on 5/14 for same day L4-5 PLIF. PMH includes: anxiety, arthritis, colitis, depression, obesity, R TKA, ORIF R wrist/L wrist.   Clinical Impression   Patient admitted for above and presents with problem list below.  PTA independent with ADLs and mobility. Patient was educated on brace mgmt and wear schedule, back precautions, ADL compensatory techniques, AE/DME, mobility progression, safety and recommendations.  Today, pt demonstrated ability to complete bed mobility with supervision, transfers using RW with min guard fading to supervision, functional mobility using RW with min guard fading to supervision, and ADLs with up to min assist for LB (does have AE and family support as needed) but verbal cueing to adhere to back precautions throughout session.  Based on performance today, believe she will best benefit from continued OT services acutely but anticipate no further OT needs after dc home. Will follow.        Recommendations for follow up therapy are one component of a multi-disciplinary discharge planning process, led by the attending physician.  Recommendations may be updated based on patient status, additional functional criteria and insurance authorization.   Assistance Recommended at Discharge Intermittent Supervision/Assistance  Patient can return home with the following A little help with bathing/dressing/bathroom;Assistance with cooking/housework;Assist for transportation;Help with stairs or ramp for entrance    Functional Status Assessment  Patient has had a recent decline in their functional status and demonstrates the ability to make significant improvements in function in a reasonable and predictable amount of time.  Equipment Recommendations  None recommended by OT    Recommendations for  Other Services       Precautions / Restrictions Precautions Precautions: Back Precaution Booklet Issued: Yes (comment) Precaution Comments: reviewed with pt, cueing to adhere functionally Required Braces or Orthoses: Spinal Brace Spinal Brace: Lumbar corset Restrictions Weight Bearing Restrictions: No      Mobility Bed Mobility Overal bed mobility: Needs Assistance Bed Mobility: Rolling, Sidelying to Sit Rolling: Supervision Sidelying to sit: Supervision       General bed mobility comments: log roll technique    Transfers Overall transfer level: Needs assistance Equipment used: Rolling walker (2 wheels) Transfers: Sit to/from Stand Sit to Stand: Min guard           General transfer comment: cueing for hand placement and posture      Balance Overall balance assessment: Needs assistance Sitting-balance support: No upper extremity supported, Feet supported Sitting balance-Leahy Scale: Good     Standing balance support: Bilateral upper extremity supported, No upper extremity supported, During functional activity Standing balance-Leahy Scale: Fair Standing balance comment: RW dynamically                           ADL either performed or assessed with clinical judgement   ADL Overall ADL's : Needs assistance/impaired     Grooming: Supervision/safety;Standing;Cueing for compensatory techniques;Oral care           Upper Body Dressing : Sitting;Supervision/safety   Lower Body Dressing: Minimal assistance;Sit to/from stand;Cueing for compensatory techniques Lower Body Dressing Details (indicate cue type and reason): assist to don shoes and tie, discussed compesnatory techniques, AE, and positioning Toilet Transfer: Min guard;Ambulation;Rolling walker (2 wheels)     Toileting - Clothing Manipulation Details (indicate cue type and reason): verbally educated on techniques     Functional  mobility during ADLs: Min guard;Supervision/safety;Rolling  walker (2 wheels) General ADL Comments: min guard fading to supervision for mobility using RW, cueing for upright posture during mobility and ADls to adhere to back precautions     Vision   Vision Assessment?: No apparent visual deficits     Perception     Praxis      Pertinent Vitals/Pain       Hand Dominance Right   Extremity/Trunk Assessment Upper Extremity Assessment Upper Extremity Assessment: Overall WFL for tasks assessed   Lower Extremity Assessment Lower Extremity Assessment: Defer to PT evaluation       Communication Communication Communication: No difficulties   Cognition Arousal/Alertness: Awake/alert Behavior During Therapy: WFL for tasks assessed/performed Overall Cognitive Status: Within Functional Limits for tasks assessed                                       General Comments       Exercises     Shoulder Instructions      Home Living Family/patient expects to be discharged to:: Private residence Living Arrangements: Other relatives (2 sisters) Available Help at Discharge: Family;Available 24 hours/day Type of Home: House Home Access: Stairs to enter [employer] of Steps: 1 Entrance Stairs-Rails: None Home Layout: Two level Alternate Level Stairs-Number of Steps: flight Alternate Level Stairs-Rails:  (+ rail) Bathroom Shower/Tub: Producer, television/film/video: Standard     Home Equipment: Shower seat - built in;Toilet riser;Grab bars - tub/shower;Rolling Walker (2 wheels);BSC/3in1;Adaptive equipment Adaptive Equipment: Reacher;Sock aid;Long-handled shoe horn;Long-handled sponge        Prior Functioning/Environment Prior Level of Function : Independent/Modified Independent             Mobility Comments: no AD ADLs Comments: managing ADLs, retired Engineer, civil (consulting)        OT Problem List: Decreased strength;Decreased activity tolerance;Pain;Obesity;Decreased knowledge of precautions;Decreased knowledge of  use of DME or AE;Impaired balance (sitting and/or standing)      OT Treatment/Interventions: Self-care/ADL training;Therapeutic exercise;DME and/or AE instruction;Therapeutic activities;Balance training;Patient/family education    OT Goals(Current goals can be found in the care plan section) Acute Rehab OT Goals Patient Stated Goal: home OT Goal Formulation: With patient Time For Goal Achievement: 08/06/22 Potential to Achieve Goals: Good  OT Frequency: Min 2X/week    Co-evaluation              AM-PAC OT "6 Clicks" Daily Activity     Outcome Measure Help from another person eating meals?: None Help from another person taking care of personal grooming?: A Little Help from another person toileting, which includes using toliet, bedpan, or urinal?: A Little Help from another person bathing (including washing, rinsing, drying)?: A Little Help from another person to put on and taking off regular upper body clothing?: A Little Help from another person to put on and taking off regular lower body clothing?: A Little 6 Click Score: 19   End of Session Equipment Utilized During Treatment: Rolling walker (2 wheels);Back brace Nurse Communication: Mobility status  Activity Tolerance: Patient tolerated treatment well Patient left: in chair;with call bell/phone within reach  OT Visit Diagnosis: Other abnormalities of gait and mobility (R26.89);Pain Pain - part of body:  (back')                Time: 1610-9604 OT Time Calculation (min): 39 min Charges:  OT General Charges $OT Visit: 1 Visit OT Evaluation $OT  Eval Low Complexity: 1 Low OT Treatments $Self Care/Home Management : 8-22 mins  Barry Brunner, OT Acute Rehabilitation Services Office [phone]   Chancy Milroy 07/23/2022, 10:32 AM

## 2022-07-23 NOTE — Anesthesia Postprocedure Evaluation (Signed)
Anesthesia Post Note  Patient: Monique Gonzalez  Procedure(s) Performed: Posterior Lumbar Interbody Fusion Lumbar Four-Lumbar Five (Spine Lumbar)     Patient location during evaluation: PACU Anesthesia Type: General Level of consciousness: awake and alert Pain management: pain level controlled Vital Signs Assessment: post-procedure vital signs reviewed and stable Respiratory status: spontaneous breathing, nonlabored ventilation, respiratory function stable and patient connected to nasal cannula oxygen Cardiovascular status: blood pressure returned to baseline and stable Postop Assessment: no apparent nausea or vomiting Anesthetic complications: no   No notable events documented.  Last Vitals:  Vitals:   07/23/22 0421 07/23/22 0752  BP: 133/72 112/64  Pulse: 87 98  Resp: 20 18  Temp: 37.1 C 37.2 C  SpO2: 98% 94%    Last Pain:  Vitals:   07/23/22 0752  TempSrc: Oral  PainSc:                  Collene Schlichter

## 2022-07-23 NOTE — Progress Notes (Signed)
Patient alert and oriented, voiding adequately, MAE well with no difficulty. Incision area cdi with no s/s of infection. Patient discharged home per order. Patient stated understanding of discharge instructions given. Patient has an appointment with Dr. Conchita Paris

## 2022-07-24 ENCOUNTER — Other Ambulatory Visit: Payer: Self-pay

## 2022-07-25 ENCOUNTER — Encounter (HOSPITAL_COMMUNITY): Payer: Self-pay | Admitting: Neurosurgery

## 2022-08-01 ENCOUNTER — Encounter (HOSPITAL_COMMUNITY): Payer: Self-pay | Admitting: Neurosurgery

## 2022-08-01 NOTE — Addendum Note (Signed)
Addendum  created 08/01/22 0945 by Collene Schlichter, MD   Intraprocedure Event edited, Intraprocedure Staff edited

## 2022-09-01 NOTE — Therapy (Unsigned)
OUTPATIENT PHYSICAL THERAPY THORACOLUMBAR EVALUATION   Patient Name: Monique Gonzalez MRN: [id number] DOB:August 13, 1955, 67 y.o., female Today's Date: 09/01/2022  END OF SESSION:   Past Medical History:  Diagnosis Date   Allergy    Anxiety    Phreesia 06/04/2020   Arthritis    Phreesia 07/08/2020   Chronic pain    back and knee   Colitis 09/2012   infectious vs inflammatory.    Colon stricture [facility])    Depression    Depression    Phreesia 06/04/2020   Depression    Phreesia 07/08/2020   GERD (gastroesophageal reflux disease)    Phreesia 06/04/2020   Hyperlipidemia    Obesity    Osteoarthritis    Osteopenia    Recurrent cold sores    Seasonal allergies    Syncope and collapse 11/30/2012   Normal EEG.  Vaso vagal syncope   Past Surgical History:  Procedure Laterality Date   BIOPSY  02/21/2020   Procedure: BIOPSY;  Surgeon: Iva Boop, MD;  Location: [facility] ENDOSCOPY;  Service: Endoscopy;;   COLON RESECTION N/A 10/14/2013   Procedure: LAPAROSCOPIC MOBILIZATION OF SPLENIC FLEXURE, LAPAROSCOPIC EXTENDED LEFT COLECTOMY;  Surgeon: Atilano Ina, MD;  Location: [facility] OR;  Service: General;  Laterality: N/A;   COLON SURGERY N/A    Phreesia 06/04/2020   COLONOSCOPY N/A 10/12/2013   Procedure: COLONOSCOPY;  Surgeon: Beverley Fiedler, MD;  Location: [facility] ENDOSCOPY;  Service: Endoscopy;  Laterality: N/A;   COLONOSCOPY WITH PROPOFOL N/A 02/21/2020   Procedure: COLONOSCOPY WITH PROPOFOL;  Surgeon: Iva Boop, MD;  Location: [facility] ENDOSCOPY;  Service: Endoscopy;  Laterality: N/A;   DILATION AND CURETTAGE, DIAGNOSTIC / THERAPEUTIC  1987   OPEN REDUCTION INTERNAL FIXATION (ORIF) DISTAL RADIAL FRACTURE Right 08/08/2014   Procedure: OPEN REDUCTION INTERNAL FIXATION (ORIF) DISTAL RADIAL FRACTURE;  Surgeon: Dominica Severin, MD;  Location: [facility] OR;  Service: Orthopedics;  Laterality: Right;  DVR Crosslock, MD available sometime around 1430-1500   ORIF ANKLE FRACTURE Right 11/10/2019   Procedure:  POSSIBLEOPEN REDUCTION INTERNAL FIXATION (ORIF) ANKLE FRACTURE;  Surgeon: Roby Lofts, MD;  Location: [facility] OR;  Service: Orthopedics;  Laterality: Right;   ORIF WRIST FRACTURE Left 11/10/2019   Procedure: OPEN REDUCTION INTERNAL FIXATION (ORIF) WRIST FRACTURE;  Surgeon: Roby Lofts, MD;  Location: [facility] OR;  Service: Orthopedics;  Laterality: Left;   TONSILLECTOMY     TOTAL KNEE ARTHROPLASTY Right 08/05/2016   Procedure: RIGHT TOTAL KNEE ARTHROPLASTY;  Surgeon: Kathryne Hitch, MD;  Location: [facility] OR;  Service: Orthopedics;  Laterality: Right;   Patient Active Problem List   Diagnosis Date Noted   Spondylolisthesis of lumbar region 07/22/2022   Ischemic stricture intestine (HCC)    Colitis 02/19/2020   Closed fracture of left distal radius 11/10/2019   Fall 11/10/2019   Closed right ankle fracture 11/09/2019   Ankle fracture 11/09/2019   Closed displaced fracture of styloid process of left ulna    Anxiety and depression 12/22/2016   Insulin resistance 10/15/2016   Presence of right artificial knee joint 10/14/2016   Vitamin D deficiency 04/08/2016   Constipation 09/20/2013   Recurrent cold sores    Osteoarthritis    Depression    Hyperlipidemia     PCP: Dr Terance Ice  REFERRING PROVIDER: Dr Lisbeth Renshaw  REFERRING DIAG: lumbar spinal stenosis  Rationale for Evaluation and Treatment: Rehabilitation  THERAPY DIAG:  No diagnosis found.  ONSET DATE: 07/22/22  SUBJECTIVE:  SUBJECTIVE STATEMENT: Patient reports that she had a spinal fusion 07/22/22 for L4/5 with good resolution of nerve pain. She had some HH PT and OT which finished up last week. She feels she needs to work on relief of muscle spasms in back. She has spasms with walking. She has some unsteadiness with gait.  Continues to wear a brace when up during the day. She does not sleep with brace. MD wants pt to continue wearing brace until she returns to see him 10/06/22  PERTINENT HISTORY:  LBP for 10 years on and off with significant pain in the past 1.5 years; arthritis; R TKA; L knee pain; fx bilat wrists ORIF; bowel resection; idiopathic constipation; depression; obesity  PAIN:  Are you having pain? Yes: NPRS scale: 1-2/10 Pain location: lower back; R side of spine  Pain description: aching; burning; muscle  Aggravating factors: walking Relieving factors: sitting; lying in the bed; muscle relaxer   PRECAUTIONS: post op lumbar fusion- to remain in lumbar brace until RTD 10/06/22; out of brace to sleep; no lifting; no bending hands past knees   WEIGHT BEARING RESTRICTIONS: No  FALLS:  Has patient fallen in last 6 months? 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   PATIENT SURVEYS:  FOTO 25; goal 81  SCREENING FOR RED FLAGS: Bowel or bladder incontinence: No Spinal tumors: No Cauda equina syndrome: No Compression fracture: No Abdominal aneurysm: No  COGNITION: Overall cognitive status: Within functional limits for tasks assessed     SENSATION: WFL  MUSCLE LENGTH: Hamstrings: Right 75 deg; Left 70 deg Thomas test: tight bilat   POSTURE: rounded shoulders, forward head, and flexed trunk   PALPATION: Muscular tightness noted through  hip flexors bilat; Rt thoracolumbar paraspinals  (Patient in lumbar brace limiting palpation)   LUMBAR ROM:   AROM eval  Flexion Fingers to knees   Extension Neutral  Right lateral flexion 60% pulling pain L LB  Left lateral flexion 65% cramping L LB  Right rotation NT  Left rotation NT   (Blank rows = not tested)  LOWER EXTREMITY ROM:  hip mobility WFL's except hip extension which is limited   (ROM generally limited by obesity)  Active  Right eval Left eval  Hip flexion    Hip extension    Hip abduction    Hip adduction    Hip internal rotation    Hip external rotation    Knee flexion    Knee extension    Ankle dorsiflexion    Ankle plantarflexion    Ankle inversion    Ankle eversion     (Blank rows = not tested)  LOWER EXTREMITY MMT:  assessed in sitting and supine - patient unable to lie sidelying or prone   MMT Right eval Left eval  Hip flexion 4 4  Hip extension 4- 4-  Hip abduction 4 4  Hip adduction    Hip internal rotation    Hip external rotation    Knee  flexion 5 5  Knee extension 5 5  Ankle dorsiflexion    Ankle plantarflexion    Ankle inversion    Ankle eversion     (Blank rows = not tested)  LUMBAR SPECIAL TESTS:  Straight leg raise test: Negative and Slump test: Negative  FUNCTIONAL TESTS:  5 times sit to stand: 18.46 sec using UE's to move sit to stand; reports cramping R side   GAIT: Distance walked: 40 Assistive device utilized: Quad cane small base Level of assistance: Complete Independence Comments: wide based gait; flexed forward at hips; limp R LE during Wt bearing R   [facility] Adult PT Treatment:                                                DATE: 09/03/22 Therapeutic Exercise: Supine  4 part core 10 sec x 10  Hamstring stretch with strap oppositee knee bent 30 sec x 2 R/L Beginners bridge 3-5 sec x 5  Hip abduction isometric alternating LE's green TB 3-5 sec x 5 R/L  Sitting Sit to stand x 5  Standing  Heel raises Marching    Self Care: Suggested patient take short breaks when walking to avoid back spasms (currently will have spasm when on her third lat around her home 1 Lap ~ 170 feet)    PATIENT EDUCATION:  Education details: POC; HEP  Person educated: Patient Education method: Explanation, Demonstration, Tactile cues, Verbal cues, and Handouts Education comprehension: verbalized understanding, returned demonstration, verbal cues required, tactile cues required, and needs further education  HOME EXERCISE PROGRAM: Access Code: [email or web address] URL: [email or web address] Date: 09/02/2022 Prepared by: Corlis Leak  Exercises - Supine Transversus Abdominis Bracing with Pelvic Floor Contraction  - 2 x daily - 7 x weekly - 1 sets - 10 reps - 10sec  hold - Beginner Bridge  - 2 x daily - 7 x weekly - 1 sets - 10 reps - 3-5 sec  hold - Hooklying Hamstring Stretch with Strap  - 2 x daily - 7 x weekly - 1 sets - 3 reps - 30 sec  hold - Hooklying Isometric Clamshell  - 2 x daily - 7 x weekly - 1 sets - 10 reps - 3 sec  hold  ASSESSMENT:  CLINICAL IMPRESSION: Patient is a 67 y.o. female who was seen today for physical therapy evaluation and treatment following L4/5 lumbar fusion with diagnosis of lumbar spinal stenosis and lumbar radicular pain. Patient reports that the "nerve pain" has completely resolved. She now has spasms in the R thoracolumbar spine with walking > 400 feet. She presents in clinic with poor posture and alignment; limited mobility through the spine and LE's; decreased core and LE strength; poor balance; decreased transfer abilities; abnormal gait with use of quad cane; decreased functional abilities; decreased endurance; pain on a daily basis. Patient will benefit from PT to address problems identified.   OBJECTIVE IMPAIRMENTS: Abnormal gait, decreased activity tolerance, decreased balance, decreased mobility, decreased ROM, decreased strength, increased muscle spasms, impaired flexibility,  improper body mechanics, postural dysfunction, obesity, and pain.   ACTIVITY LIMITATIONS: carrying, lifting, bending, sitting, standing, squatting, stairs, transfers, bed mobility, and locomotion level  PARTICIPATION LIMITATIONS: meal prep, cleaning, laundry, driving, shopping, and community activity  PERSONAL FACTORS: Fitness, Past/current experiences, Time since onset of injury/illness/exacerbation, and comorbidities including obesity and prior R TKA are also affecting patient's functional  outcome.   REHAB POTENTIAL: Good  CLINICAL DECISION MAKING: Stable/uncomplicated  EVALUATION COMPLEXITY: Low   GOALS: Goals reviewed with patient? Yes  SHORT TERM GOALS: Target date: 10/14/2022  Independent in initial HEP Baseline: Goal status: INITIAL  2.  Patient reports/demonstrates ability to stand for 5-10 min for light household and grooming tasks  Baseline:  Goal status: INITIAL  3.  Independent in gait with least restrictive assistive device for 500 feet without back spasm  Baseline:  Goal status: INITIAL  LONG TERM GOALS: Target date: 11/25/2022   Increase core strength and stability with patient to demonstrate ability to participate in 20 min of exercise without increase in symptoms  Baseline:  Goal status: INITIAL  2.  4+/5 to 5/5 strength bilat LE's  Baseline:  Goal status: INITIAL  3.  Decrease sit to stand times test by 3-5 seconds  Baseline:  Goal status: INITIAL  4.  Patient demonstrates/reports ability to ambulate community distances for functional tasks with least restrictive assistive device (at least 1000 feet)  Baseline:  Goal status: INITIAL  5.  Patient reports ability to do laundry at home  Baseline:  Goal status: INITIAL  6.  Independent in HEP including aquatic program as indicated  Baseline:  Goal status: INITIAL  7.  Improve functional limitation score to 42  Baseline: 25  Goal status: INITIAL   PLAN:  PT FREQUENCY: 2x/week  PT DURATION:  12 weeks  PLANNED INTERVENTIONS: Therapeutic exercises, Therapeutic activity, Neuromuscular re-education, Balance training, Gait training, Patient/Family education, Self Care, Joint mobilization, Aquatic Therapy, Dry Needling, Electrical stimulation, Spinal mobilization, Cryotherapy, Moist heat, Taping, Ultrasound, Ionotophoresis 4mg /ml Dexamethasone, Manual therapy, and Re-evaluation.  PLAN FOR NEXT SESSION: review and progress with exercise; continue spine care and body mechanics education; manual work, DN, modalities as indicated   [employer], PT 09/01/2022, 2:40 PM

## 2022-09-02 ENCOUNTER — Encounter: Payer: Self-pay | Admitting: Rehabilitative and Restorative Service Providers"

## 2022-09-02 ENCOUNTER — Ambulatory Visit: Payer: Medicare Other | Attending: Neurosurgery | Admitting: Rehabilitative and Restorative Service Providers"

## 2022-09-02 ENCOUNTER — Other Ambulatory Visit: Payer: Self-pay

## 2022-09-02 DIAGNOSIS — R252 Cramp and spasm: Secondary | ICD-10-CM | POA: Diagnosis present

## 2022-09-02 DIAGNOSIS — R29898 Other symptoms and signs involving the musculoskeletal system: Secondary | ICD-10-CM | POA: Diagnosis present

## 2022-09-02 DIAGNOSIS — M5441 Lumbago with sciatica, right side: Secondary | ICD-10-CM | POA: Insufficient documentation

## 2022-09-02 DIAGNOSIS — R262 Difficulty in walking, not elsewhere classified: Secondary | ICD-10-CM | POA: Diagnosis present

## 2022-09-02 DIAGNOSIS — G8929 Other chronic pain: Secondary | ICD-10-CM

## 2022-09-02 DIAGNOSIS — M62830 Muscle spasm of back: Secondary | ICD-10-CM | POA: Diagnosis present

## 2022-09-02 DIAGNOSIS — M6281 Muscle weakness (generalized): Secondary | ICD-10-CM | POA: Diagnosis present

## 2022-09-04 ENCOUNTER — Ambulatory Visit: Payer: Medicare Other

## 2022-09-04 ENCOUNTER — Encounter: Payer: Self-pay | Admitting: Family Medicine

## 2022-09-04 DIAGNOSIS — G8929 Other chronic pain: Secondary | ICD-10-CM

## 2022-09-04 DIAGNOSIS — M5441 Lumbago with sciatica, right side: Secondary | ICD-10-CM | POA: Diagnosis not present

## 2022-09-04 DIAGNOSIS — R262 Difficulty in walking, not elsewhere classified: Secondary | ICD-10-CM

## 2022-09-04 DIAGNOSIS — R29898 Other symptoms and signs involving the musculoskeletal system: Secondary | ICD-10-CM

## 2022-09-04 DIAGNOSIS — R252 Cramp and spasm: Secondary | ICD-10-CM

## 2022-09-04 DIAGNOSIS — M62830 Muscle spasm of back: Secondary | ICD-10-CM

## 2022-09-04 DIAGNOSIS — M6281 Muscle weakness (generalized): Secondary | ICD-10-CM

## 2022-09-04 NOTE — Therapy (Signed)
OUTPATIENT PHYSICAL THERAPY THORACOLUMBAR TREATMENT   Patient Name: CHOSEN GARRON MRN: [id number] DOB:May 24, 1955, 67 y.o., female Today's Date: 09/04/2022  END OF SESSION:  PT End of Session - 09/04/22 1057     Visit Number 2    Number of Visits 24    Date for PT Re-Evaluation 11/25/22    Authorization Type medicare & [employer] of [place]    Progress Note Due on Visit 10    PT Start Time 1100    PT Stop Time 1140    PT Time Calculation (min) 40 min    Activity Tolerance Patient tolerated treatment well    Behavior During Therapy [facility] for tasks assessed/performed             Past Medical History:  Diagnosis Date   Allergy    Anxiety    Phreesia 06/04/2020   Arthritis    Phreesia 07/08/2020   Chronic pain    back and knee   Colitis 09/2012   infectious vs inflammatory.    Colon stricture [facility])    Depression    Depression    Phreesia 06/04/2020   Depression    Phreesia 07/08/2020   GERD (gastroesophageal reflux disease)    Phreesia 06/04/2020   Hyperlipidemia    Obesity    Osteoarthritis    Osteopenia    Recurrent cold sores    Seasonal allergies    Syncope and collapse 11/30/2012   Normal EEG.  Vaso vagal syncope   Past Surgical History:  Procedure Laterality Date   BIOPSY  02/21/2020   Procedure: BIOPSY;  Surgeon: Iva Boop, MD;  Location: [facility] ENDOSCOPY;  Service: Endoscopy;;   COLON RESECTION N/A 10/14/2013   Procedure: LAPAROSCOPIC MOBILIZATION OF SPLENIC FLEXURE, LAPAROSCOPIC EXTENDED LEFT COLECTOMY;  Surgeon: Atilano Ina, MD;  Location: [facility] OR;  Service: General;  Laterality: N/A;   COLON SURGERY N/A    Phreesia 06/04/2020   COLONOSCOPY N/A 10/12/2013   Procedure: COLONOSCOPY;  Surgeon: Beverley Fiedler, MD;  Location: [facility] ENDOSCOPY;  Service: Endoscopy;  Laterality: N/A;   COLONOSCOPY WITH PROPOFOL N/A 02/21/2020   Procedure: COLONOSCOPY WITH PROPOFOL;  Surgeon: Iva Boop, MD;  Location[facility] ENDOSCOPY;  Service: Endoscopy;  Laterality: N/A;    DILATION AND CURETTAGE, DIAGNOSTIC / THERAPEUTIC  1987   OPEN REDUCTION INTERNAL FIXATION (ORIF) DISTAL RADIAL FRACTURE Right 08/08/2014   Procedure: OPEN REDUCTION INTERNAL FIXATION (ORIF) DISTAL RADIAL FRACTURE;  Surgeon: Dominica Severin, MD;  Location: [facility] OR;  Service: Orthopedics;  Laterality: Right;  DVR Crosslock, MD available sometime around 1430-1500   ORIF ANKLE FRACTURE Right 11/10/2019   Procedure: POSSIBLEOPEN REDUCTION INTERNAL FIXATION (ORIF) ANKLE FRACTURE;  Surgeon: Roby Lofts, MD;  Location: [facility] OR;  Service: Orthopedics;  Laterality: Right;   ORIF WRIST FRACTURE Left 11/10/2019   Procedure: OPEN REDUCTION INTERNAL FIXATION (ORIF) WRIST FRACTURE;  Surgeon: Roby Lofts, MD;  Location: [facility] OR;  Service: Orthopedics;  Laterality: Left;   TONSILLECTOMY     TOTAL KNEE ARTHROPLASTY Right 08/05/2016   Procedure: RIGHT TOTAL KNEE ARTHROPLASTY;  Surgeon: Kathryne Hitch, MD;  Location: [facility] OR;  Service: Orthopedics;  Laterality: Right;   Patient Active Problem List   Diagnosis Date Noted   Spondylolisthesis of lumbar region 07/22/2022   Ischemic stricture intestine (HCC)    Colitis 02/19/2020   Closed fracture of left distal radius 11/10/2019   Fall 11/10/2019   Closed right ankle fracture 11/09/2019   Ankle fracture 11/09/2019   Closed displaced fracture of styloid  process of left ulna    Anxiety and depression 12/22/2016   Insulin resistance 10/15/2016   Presence of right artificial knee joint 10/14/2016   Vitamin D deficiency 04/08/2016   Constipation 09/20/2013   Recurrent cold sores    Osteoarthritis    Depression    Hyperlipidemia     PCP: Dr Terance Ice  REFERRING PROVIDER: Dr Lisbeth Renshaw  REFERRING DIAG: lumbar spinal stenosis  Rationale for Evaluation and Treatment: Rehabilitation  THERAPY DIAG:  Chronic bilateral low back pain with right-sided sciatica  Muscle spasm of back  Other symptoms and signs involving the musculoskeletal  system  Muscle weakness (generalized)  Difficulty in walking, not elsewhere classified  Cramp and spasm  ONSET DATE: 07/22/22  SUBJECTIVE:                                                                                                                                                                                           SUBJECTIVE STATEMENT: Patient reports she is having a "woozy" day; states her back is feeling better today.   PERTINENT HISTORY:  LBP for 10 years on and off with significant pain in the past 1.5 years; arthritis; R TKA; L knee pain; fx bilat wrists ORIF; bowel resection; idiopathic constipation; depression; obesity  PAIN:  Are you having pain? Yes: NPRS scale: 1-2/10 Pain location: lower back; R side of spine  Pain description: aching; burning; muscle  Aggravating factors: walking Relieving factors: sitting; lying in the bed; muscle relaxer   PRECAUTIONS: post op lumbar fusion- to remain in lumbar brace until RTD 10/06/22; out of brace to sleep; no lifting; no bending hands past knees   WEIGHT BEARING RESTRICTIONS: No  FALLS:  Has patient fallen in last 6 months? 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   PATIENT SURVEYS:  FOTO 25; goal 66  SCREENING FOR RED FLAGS: Bowel or bladder incontinence: No Spinal tumors: No Cauda equina syndrome: No Compression fracture: No Abdominal aneurysm:  No  COGNITION: Overall cognitive status: Within functional limits for tasks assessed     SENSATION: WFL  MUSCLE LENGTH:  Hamstrings: Right 75 deg; Left 70 deg Thomas test: tight bilat   POSTURE: rounded shoulders, forward head, and flexed trunk   PALPATION: Muscular tightness noted through hip flexors bilat; Rt thoracolumbar paraspinals  (Patient in lumbar brace limiting palpation)   LUMBAR ROM:   AROM eval  Flexion Fingers to knees   Extension Neutral  Right lateral flexion 60% pulling pain L LB  Left lateral flexion 65% cramping L LB  Right rotation NT  Left rotation NT   (Blank rows = not tested)  LOWER EXTREMITY ROM:  hip mobility WFL's except hip extension which is limited   (ROM generally limited by obesity)  Active  Right eval Left eval  Hip flexion    Hip extension    Hip abduction    Hip adduction    Hip internal rotation    Hip external rotation    Knee flexion    Knee extension    Ankle dorsiflexion    Ankle plantarflexion    Ankle inversion    Ankle eversion     (Blank rows = not tested)  LOWER EXTREMITY MMT:  assessed in sitting and supine - patient unable to lie sidelying or prone   MMT Right eval Left eval  Hip flexion 4 4  Hip extension 4- 4-  Hip abduction 4 4  Hip adduction    Hip internal rotation    Hip external rotation    Knee flexion 5 5  Knee extension 5 5  Ankle dorsiflexion    Ankle plantarflexion    Ankle inversion    Ankle eversion     (Blank rows = not tested)  LUMBAR SPECIAL TESTS:  Straight leg raise test: Negative and Slump test: Negative  FUNCTIONAL TESTS:  5 times sit to stand: 18.46 sec using UE's to move sit to stand; reports cramping R side   GAIT: Distance walked: 40 Assistive device utilized: Quad cane small base Level of assistance: Complete Independence Comments: wide based gait; flexed forward at hips; limp R LE during Wt bearing R    [facility] Adult PT Treatment:                                                 DATE: 09/04/2022 Therapeutic Exercise: Supine: Abdominal bracing --> TA activation Bridges x10 HS/ITB stretches 2x30" each   Hooklying hip add iso ball squeeze 10x5" Unilateral clamshell GTB (focus on opp hip stability) x10  Seated: Red SB roll out with hip hinge x10 STS 2x10 (hands braced on thighs) NuStep L5 x    [facility] Adult PT Treatment:                                                DATE: 09/03/22 Therapeutic Exercise: Supine  4 part core 10 sec x 10  Hamstring stretch with strap oppositee knee bent 30 sec x 2 R/L Beginners bridge 3-5 sec x 5  Hip abduction isometric alternating LE's green TB 3-5 sec x 5 R/L  Sitting Sit to stand x 5  Standing  Heel raises Marching   Self Care: Suggested patient take short breaks when walking to avoid back spasms (currently will have spasm when on her third lat around her home 1 Lap ~  170 feet)    PATIENT EDUCATION:  Education details: POC; HEP  Person educated: Patient Education method: Explanation, Demonstration, Tactile cues, Verbal cues, and Handouts Education comprehension: verbalized understanding, returned demonstration, verbal cues required, tactile cues required, and needs further education  HOME EXERCISE PROGRAM: Access Code: [email or web address] URL: [email or web address] Date: 09/04/2022 Prepared by: Carlynn Herald  Exercises - Supine Transversus Abdominis Bracing with Pelvic Floor Contraction  - 2 x daily - 7 x weekly - 1 sets - 10 reps - 10sec  hold - Beginner Bridge  - 2 x daily - 7 x weekly - 1 sets - 10 reps - 3-5 sec  hold - Hooklying Hamstring Stretch with Strap  - 2 x daily - 7 x weekly - 1 sets - 3 reps - 30 sec  hold - Hooklying Isometric Clamshell  - 2 x daily - 7 x weekly - 1 sets - 10 reps - 3 sec  hold - Supine Hip Adduction Isometric with Ball  - 1 x daily - 7 x weekly - 3 sets - 10 reps - Sit to Stand with Hands on Knees  - 1 x daily - 7 x weekly - 3 sets - 10  reps  ASSESSMENT:  CLINICAL IMPRESSION: Patient able to progress to sit to stand with hands braced on thighs, demonstrating good stability and no increase in pain/cramping. Patient demonstrated good bed mobility mechanics with sit <--> supine transfers.    Eval: Patient is a 67 y.o. female who was seen today for physical therapy evaluation and treatment following L4/5 lumbar fusion with diagnosis of lumbar spinal stenosis and lumbar radicular pain. Patient reports that the "nerve pain" has completely resolved. She now has spasms in the R thoracolumbar spine with walking > 400 feet. She presents in clinic with poor posture and alignment; limited mobility through the spine and LE's; decreased core and LE strength; poor balance; decreased transfer abilities; abnormal gait with use of quad cane; decreased functional abilities; decreased endurance; pain on a daily basis. Patient will benefit from PT to address problems identified.   OBJECTIVE IMPAIRMENTS: Abnormal gait, decreased activity tolerance, decreased balance, decreased mobility, decreased ROM, decreased strength, increased muscle spasms, impaired flexibility, improper body mechanics, postural dysfunction, obesity, and pain.   ACTIVITY LIMITATIONS: carrying, lifting, bending, sitting, standing, squatting, stairs, transfers, bed mobility, and locomotion level  PARTICIPATION LIMITATIONS: meal prep, cleaning, laundry, driving, shopping, and community activity  PERSONAL FACTORS: Fitness, Past/current experiences, Time since onset of injury/illness/exacerbation, and comorbidities including obesity and prior R TKA are also affecting patient's functional outcome.   REHAB POTENTIAL: Good  CLINICAL DECISION MAKING: Stable/uncomplicated  EVALUATION COMPLEXITY: Low   GOALS: Goals reviewed with patient? Yes  SHORT TERM GOALS: Target date: 10/14/2022  Independent in initial HEP Baseline: Goal status: INITIAL  2.  Patient reports/demonstrates  ability to stand for 5-10 min for light household and grooming tasks  Baseline:  Goal status: INITIAL  3.  Independent in gait with least restrictive assistive device for 500 feet without back spasm  Baseline:  Goal status: INITIAL  LONG TERM GOALS: Target date: 11/25/2022   Increase core strength and stability with patient to demonstrate ability to participate in 20 min of exercise without increase in symptoms  Baseline:  Goal status: INITIAL  2.  4+/5 to 5/5 strength bilat LE's  Baseline:  Goal status: INITIAL  3.  Decrease sit to stand times test by 3-5 seconds  Baseline:  Goal status: INITIAL  4.  Patient demonstrates/reports ability to ambulate community distances  for functional tasks with least restrictive assistive device (at least 1000 feet)  Baseline:  Goal status: INITIAL  5.  Patient reports ability to do laundry at home  Baseline:  Goal status: INITIAL  6.  Independent in HEP including aquatic program as indicated  Baseline:  Goal status: INITIAL  7.  Improve functional limitation score to 42  Baseline: 25  Goal status: INITIAL   PLAN:  PT FREQUENCY: 2x/week  PT DURATION: 12 weeks  PLANNED INTERVENTIONS: Therapeutic exercises, Therapeutic activity, Neuromuscular re-education, Balance training, Gait training, Patient/Family education, Self Care, Joint mobilization, Aquatic Therapy, Dry Needling, Electrical stimulation, Spinal mobilization, Cryotherapy, Moist heat, Taping, Ultrasound, Ionotophoresis 4mg /ml Dexamethasone, Manual therapy, and Re-evaluation.  PLAN FOR NEXT SESSION: NuStep warm-up. Progress exercises as toerated; continue spine care and body mechanics education; manual work, DN, modalities as indicated   Sanjuana Mae, PTA 09/04/2022, 11:42 AM

## 2022-09-10 ENCOUNTER — Ambulatory Visit: Payer: Medicare Other | Attending: Neurosurgery

## 2022-09-10 DIAGNOSIS — M5441 Lumbago with sciatica, right side: Secondary | ICD-10-CM | POA: Diagnosis present

## 2022-09-10 DIAGNOSIS — R252 Cramp and spasm: Secondary | ICD-10-CM | POA: Insufficient documentation

## 2022-09-10 DIAGNOSIS — M62830 Muscle spasm of back: Secondary | ICD-10-CM | POA: Diagnosis present

## 2022-09-10 DIAGNOSIS — M6281 Muscle weakness (generalized): Secondary | ICD-10-CM | POA: Insufficient documentation

## 2022-09-10 DIAGNOSIS — G8929 Other chronic pain: Secondary | ICD-10-CM | POA: Diagnosis present

## 2022-09-10 DIAGNOSIS — R29898 Other symptoms and signs involving the musculoskeletal system: Secondary | ICD-10-CM | POA: Diagnosis present

## 2022-09-10 DIAGNOSIS — R262 Difficulty in walking, not elsewhere classified: Secondary | ICD-10-CM | POA: Diagnosis present

## 2022-09-10 NOTE — Therapy (Signed)
OUTPATIENT PHYSICAL THERAPY THORACOLUMBAR TREATMENT   Patient Name: Monique Gonzalez MRN: [id number] DOB:1955/12/29, 67 y.o., female Today's Date: 09/10/2022  END OF SESSION:  PT End of Session - 09/10/22 0928     Visit Number 3    Number of Visits 24    Date for PT Re-Evaluation 11/25/22    Authorization Type medicare & mutual of [place]    Progress Note Due on Visit 10    PT Start Time 0930    PT Stop Time 1020    PT Time Calculation (min) 50 min    Activity Tolerance Patient tolerated treatment well    Behavior During Therapy [facility] for tasks assessed/performed             Past Medical History:  Diagnosis Date   Allergy    Anxiety    Phreesia 06/04/2020   Arthritis    Phreesia 07/08/2020   Chronic pain    back and knee   Colitis 09/2012   infectious vs inflammatory.    Colon stricture [facility])    Depression    Depression    Phreesia 06/04/2020   Depression    Phreesia 07/08/2020   GERD (gastroesophageal reflux disease)    Phreesia 06/04/2020   Hyperlipidemia    Obesity    Osteoarthritis    Osteopenia    Recurrent cold sores    Seasonal allergies    Syncope and collapse 11/30/2012   Normal EEG.  Vaso vagal syncope   Past Surgical History:  Procedure Laterality Date   BIOPSY  02/21/2020   Procedure: BIOPSY;  Surgeon: Iva Boop, MD;  Location: [facility] ENDOSCOPY;  Service: Endoscopy;;   COLON RESECTION N/A 10/14/2013   Procedure: LAPAROSCOPIC MOBILIZATION OF SPLENIC FLEXURE, LAPAROSCOPIC EXTENDED LEFT COLECTOMY;  Surgeon: Atilano Ina, MD;  Location: [facility] OR;  Service: General;  Laterality: N/A;   COLON SURGERY N/A    Phreesia 06/04/2020   COLONOSCOPY N/A 10/12/2013   Procedure: COLONOSCOPY;  Surgeon: Beverley Fiedler, MD;  Location: [facility] ENDOSCOPY;  Service: Endoscopy;  Laterality: N/A;   COLONOSCOPY WITH PROPOFOL N/A 02/21/2020   Procedure: COLONOSCOPY WITH PROPOFOL;  Surgeon: Iva Boop, MD;  Location: [facility] ENDOSCOPY;  Service: Endoscopy;  Laterality: N/A;    DILATION AND CURETTAGE, DIAGNOSTIC / THERAPEUTIC  1987   OPEN REDUCTION INTERNAL FIXATION (ORIF) DISTAL RADIAL FRACTURE Right 08/08/2014   Procedure: OPEN REDUCTION INTERNAL FIXATION (ORIF) DISTAL RADIAL FRACTURE;  Surgeon: Dominica Severin, MD;  Location: [facility] OR;  Service: Orthopedics;  Laterality: Right;  DVR Crosslock, MD available sometime around 1430-1500   ORIF ANKLE FRACTURE Right 11/10/2019   Procedure: POSSIBLEOPEN REDUCTION INTERNAL FIXATION (ORIF) ANKLE FRACTURE;  Surgeon: Roby Lofts, MD;  Location: [facility] OR;  Service: Orthopedics;  Laterality: Right;   ORIF WRIST FRACTURE Left 11/10/2019   Procedure: OPEN REDUCTION INTERNAL FIXATION (ORIF) WRIST FRACTURE;  Surgeon: Roby Lofts, MD;  Location: [facility] OR;  Service: Orthopedics;  Laterality: Left;   TONSILLECTOMY     TOTAL KNEE ARTHROPLASTY Right 08/05/2016   Procedure: RIGHT TOTAL KNEE ARTHROPLASTY;  Surgeon: Kathryne Hitch, MD;  Location: [facility] OR;  Service: Orthopedics;  Laterality: Right;   Patient Active Problem List   Diagnosis Date Noted   Spondylolisthesis of lumbar region 07/22/2022   Ischemic stricture intestine (HCC)    Colitis 02/19/2020   Closed fracture of left distal radius 11/10/2019   Fall 11/10/2019   Closed right ankle fracture 11/09/2019   Ankle fracture 11/09/2019   Closed displaced fracture of styloid  process of left ulna    Anxiety and depression 12/22/2016   Insulin resistance 10/15/2016   Presence of right artificial knee joint 10/14/2016   Vitamin D deficiency 04/08/2016   Constipation 09/20/2013   Recurrent cold sores    Osteoarthritis    Depression    Hyperlipidemia     PCP: Dr Terance Ice  REFERRING PROVIDER: Dr Lisbeth Renshaw  REFERRING DIAG: lumbar spinal stenosis  Rationale for Evaluation and Treatment: Rehabilitation  THERAPY DIAG:  Chronic bilateral low back pain with right-sided sciatica  Muscle spasm of back  Other symptoms and signs involving the musculoskeletal  system  Muscle weakness (generalized)  Difficulty in walking, not elsewhere classified  Cramp and spasm  ONSET DATE: 07/22/22  SUBJECTIVE:                                                                                                                                                                                           SUBJECTIVE STATEMENT: Patient reports she reviewed HEP after last PT session and was very sore on her legs. Patient reports she had an episode Monday where she almost passed out, states it has been happening occasionally for the past 10 years and no one knows what causes it. Patient states her back spasms have been less.   PERTINENT HISTORY:  LBP for 10 years on and off with significant pain in the past 1.5 years; arthritis; R TKA; L knee pain; fx bilat wrists ORIF; bowel resection; idiopathic constipation; depression; obesity  PAIN:  Are you having pain? Yes: NPRS scale: 1-2/10 Pain location: lower back; R side of spine  Pain description: aching; burning; muscle  Aggravating factors: walking Relieving factors: sitting; lying in the bed; muscle relaxer   PRECAUTIONS: post op lumbar fusion- to remain in lumbar brace until RTD 10/06/22; out of brace to sleep; no lifting; no bending hands past knees   WEIGHT BEARING RESTRICTIONS: No  FALLS:  Has patient fallen in last 6 months? 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   PATIENT SURVEYS:  FOTO 25; goal 22  SCREENING FOR RED  FLAGS: Bowel or bladder incontinence: No Spinal tumors: No Cauda equina syndrome: No Compression fracture: No Abdominal aneurysm: No  COGNITION: Overall cognitive status: Within functional limits for tasks assessed     SENSATION: WFL  MUSCLE LENGTH: Hamstrings: Right 75 deg; Left 70 deg Thomas test: tight bilat   POSTURE: rounded shoulders, forward head, and flexed trunk   PALPATION: Muscular tightness noted through hip flexors bilat; Rt thoracolumbar paraspinals  (Patient in lumbar brace limiting palpation)   LUMBAR ROM:   AROM eval  Flexion Fingers to knees   Extension Neutral  Right lateral flexion 60% pulling pain L LB  Left lateral flexion 65% cramping L LB  Right rotation NT  Left rotation NT   (Blank rows = not tested)  LOWER EXTREMITY ROM:  hip mobility WFL's except hip extension which is limited   (ROM generally limited by obesity)  Active  Right eval Left eval  Hip flexion    Hip extension    Hip abduction    Hip adduction    Hip internal rotation    Hip external rotation    Knee flexion    Knee extension    Ankle dorsiflexion    Ankle plantarflexion    Ankle inversion    Ankle eversion     (Blank rows = not tested)  LOWER EXTREMITY MMT:  assessed in sitting and supine - patient unable to lie sidelying or prone   MMT Right eval Left eval  Hip flexion 4 4  Hip extension 4- 4-  Hip abduction 4 4  Hip adduction    Hip internal rotation    Hip external rotation    Knee flexion 5 5  Knee extension 5 5  Ankle dorsiflexion    Ankle plantarflexion    Ankle inversion    Ankle eversion     (Blank rows = not tested)  LUMBAR SPECIAL TESTS:  Straight leg raise test: Negative and Slump test: Negative  FUNCTIONAL TESTS:  5 times sit to stand: 18.46 sec using UE's to move sit to stand; reports cramping R side   GAIT: Distance walked: 40 Assistive device utilized: Quad cane  small base Level of assistance: Complete Independence Comments: wide based gait; flexed forward at hips; limp R LE during Wt bearing R    [facility] Adult PT Treatment:                                                DATE: 09/10/2022 Therapeutic Exercise: NuStep L5 x Mat Table: Abdominal bracing Bridges --> bridges with ball squeeze x10 Bent knee fall out GTB x10 B hooklying clamshell GTB x10 HS/ITB stretches 2x30" each  STS 3x5 (hands braced on thighs)  Standing hip abd x10 B --> RTB crossed at ankles x10 B Seated red SB roll out front & diagonals     [facility] Adult PT Treatment:                                                DATE: 09/04/2022 Therapeutic Exercise: Supine: Abdominal bracing --> TA activation Bridges x10 HS/ITB stretches 2x30" each   Hooklying hip add iso ball squeeze 10x5" Unilateral clamshell GTB (focus on opp hip stability) x10  Seated: Red SB roll  out with hip hinge x10 STS 2x10 (hands braced on thighs) NuStep L5 x    [facility] Adult PT Treatment:                                                DATE: 09/03/22 Therapeutic Exercise: Supine  4 part core 10 sec x 10  Hamstring stretch with strap oppositee knee bent 30 sec x 2 R/L Beginners bridge 3-5 sec x 5  Hip abduction isometric alternating LE's green TB 3-5 sec x 5 R/L  Sitting Sit to stand x 5  Standing  Heel raises Marching   Self Care: Suggested patient take short breaks when walking to avoid back spasms (currently will have spasm when on her third lat around her home 1 Lap ~ 170 feet)    PATIENT EDUCATION:  Education details: Updated HEP  Person educated: Patient Education method: Explanation, Demonstration, Tactile cues, Verbal cues, and Handouts Education comprehension: verbalized understanding, returned demonstration, verbal cues required, tactile cues required, and needs further education  HOME EXERCISE PROGRAM: Access Code: [email or web address] URL: [email or web address] Date:  09/10/2022 Prepared by: Carlynn Herald  Exercises - Supine Transversus Abdominis Bracing with Pelvic Floor Contraction  - 2 x daily - 7 x weekly - 1 sets - 10 reps - 10sec  hold - Beginner Bridge  - 2 x daily - 7 x weekly - 1 sets - 10 reps - 3-5 sec  hold - Hooklying Isometric Clamshell  - 2 x daily - 7 x weekly - 1 sets - 10 reps - 3 sec  hold - Supine Hip Adduction Isometric with Ball  - 1 x daily - 7 x weekly - 3 sets - 10 reps - Sit to Stand with Hands on Knees  - 1 x daily - 7 x weekly - 3 sets - 10 reps - Supine Hamstring Stretch with Strap  - 1 x daily - 7 x weekly - 1 sets - 3-5 reps - 30 sec hold - Supine ITB Stretch with Strap  - 1 x daily - 7 x weekly - 1 sets - 3-5 reps - 30 sec hold - Standing Hip Abduction with Resistance at Ankles and Counter Support  - 1 x daily - 7 x weekly - 3 sets - 10 reps - Seated Quadratus Lumborum Stretch with Forward Bend  - 1 x daily - 7 x weekly - 2 sets - 10 reps - 5-10 sec hold - Seated Flexion Stretch with Swiss Ball  - 1 x daily - 7 x weekly - 2 sets - 10 reps - 5-10 sec hold  ASSESSMENT:  CLINICAL IMPRESSION: Thoracolumbar mobility and LE strengthening exercises continued with progression to standing exercises. Increased low back discomfort reported during resisted standing hip abd; cueing upright postural alignment and core stabilization mechanics alleviated symptoms.    Eval: Patient is a 67 y.o. female who was seen today for physical therapy evaluation and treatment following L4/5 lumbar fusion with diagnosis of lumbar spinal stenosis and lumbar radicular pain. Patient reports that the "nerve pain" has completely resolved. She now has spasms in the R thoracolumbar spine with walking > 400 feet. She presents in clinic with poor posture and alignment; limited mobility through the spine and LE's; decreased core and LE strength; poor balance; decreased transfer abilities; abnormal gait with use of quad cane; decreased functional abilities; decreased  endurance; pain  on a daily basis. Patient will benefit from PT to address problems identified.   OBJECTIVE IMPAIRMENTS: Abnormal gait, decreased activity tolerance, decreased balance, decreased mobility, decreased ROM, decreased strength, increased muscle spasms, impaired flexibility, improper body mechanics, postural dysfunction, obesity, and pain.   ACTIVITY LIMITATIONS: carrying, lifting, bending, sitting, standing, squatting, stairs, transfers, bed mobility, and locomotion level  PARTICIPATION LIMITATIONS: meal prep, cleaning, laundry, driving, shopping, and community activity  PERSONAL FACTORS: Fitness, Past/current experiences, Time since onset of injury/illness/exacerbation, and comorbidities including obesity and prior R TKA are also affecting patient's functional outcome.   REHAB POTENTIAL: Good  CLINICAL DECISION MAKING: Stable/uncomplicated  EVALUATION COMPLEXITY: Low   GOALS: Goals reviewed with patient? Yes  SHORT TERM GOALS: Target date: 10/14/2022  Independent in initial HEP Baseline: Goal status: INITIAL  2.  Patient reports/demonstrates ability to stand for 5-10 min for light household and grooming tasks  Baseline:  Goal status: INITIAL  3.  Independent in gait with least restrictive assistive device for 500 feet without back spasm  Baseline:  Goal status: INITIAL  LONG TERM GOALS: Target date: 11/25/2022  Increase core strength and stability with patient to demonstrate ability to participate in 20 min of exercise without increase in symptoms  Baseline:  Goal status: INITIAL  2.  4+/5 to 5/5 strength bilat LE's  Baseline:  Goal status: INITIAL  3.  Decrease sit to stand times test by 3-5 seconds  Baseline:  Goal status: INITIAL  4.  Patient demonstrates/reports ability to ambulate community distances for functional tasks with least restrictive assistive device (at least 1000 feet)  Baseline:  Goal status: INITIAL  5.  Patient reports ability to do  laundry at home  Baseline:  Goal status: INITIAL  6.  Independent in HEP including aquatic program as indicated  Baseline:  Goal status: INITIAL  7.  Improve functional limitation score to 42  Baseline: 25  Goal status: INITIAL   PLAN:  PT FREQUENCY: 2x/week  PT DURATION: 12 weeks  PLANNED INTERVENTIONS: Therapeutic exercises, Therapeutic activity, Neuromuscular re-education, Balance training, Gait training, Patient/Family education, Self Care, Joint mobilization, Aquatic Therapy, Dry Needling, Electrical stimulation, Spinal mobilization, Cryotherapy, Moist heat, Taping, Ultrasound, Ionotophoresis 4mg /ml Dexamethasone, Manual therapy, and Re-evaluation.  PLAN FOR NEXT SESSION: No side lying or quadruped (too painful). Progress exercises as toerated; continue spine care and body mechanics education; manual work, DN, modalities as indicated   Sanjuana Mae, PTA 09/10/2022, 10:21 AM

## 2022-09-16 ENCOUNTER — Ambulatory Visit: Payer: Medicare Other

## 2022-09-16 DIAGNOSIS — R29898 Other symptoms and signs involving the musculoskeletal system: Secondary | ICD-10-CM

## 2022-09-16 DIAGNOSIS — G8929 Other chronic pain: Secondary | ICD-10-CM

## 2022-09-16 DIAGNOSIS — M5441 Lumbago with sciatica, right side: Secondary | ICD-10-CM | POA: Diagnosis not present

## 2022-09-16 DIAGNOSIS — M62830 Muscle spasm of back: Secondary | ICD-10-CM

## 2022-09-16 DIAGNOSIS — R262 Difficulty in walking, not elsewhere classified: Secondary | ICD-10-CM

## 2022-09-16 NOTE — Therapy (Signed)
OUTPATIENT PHYSICAL THERAPY THORACOLUMBAR TREATMENT   Patient Name: Monique Gonzalez MRN: [id number] DOB:1955-04-24, 67 y.o., female Today's Date: 09/16/2022  END OF SESSION:  PT End of Session - 09/16/22 1100     Visit Number 4    Number of Visits 24    Date for PT Re-Evaluation 11/25/22    Authorization Type medicare & [employer] of [place]    Progress Note Due on Visit 10    PT Start Time 1100    PT Stop Time 1145    PT Time Calculation (min) 45 min    Activity Tolerance Patient tolerated treatment well    Behavior During Therapy [facility] for tasks assessed/performed             Past Medical History:  Diagnosis Date   Allergy    Anxiety    Phreesia 06/04/2020   Arthritis    Phreesia 07/08/2020   Chronic pain    back and knee   Colitis 09/2012   infectious vs inflammatory.    Colon stricture [facility])    Depression    Depression    Phreesia 06/04/2020   Depression    Phreesia 07/08/2020   GERD (gastroesophageal reflux disease)    Phreesia 06/04/2020   Hyperlipidemia    Obesity    Osteoarthritis    Osteopenia    Recurrent cold sores    Seasonal allergies    Syncope and collapse 11/30/2012   Normal EEG.  Vaso vagal syncope   Past Surgical History:  Procedure Laterality Date   BIOPSY  02/21/2020   Procedure: BIOPSY;  Surgeon: Iva Boop, MD;  Location[facility] ENDOSCOPY;  Service: Endoscopy;;   COLON RESECTION N/A 10/14/2013   Procedure: LAPAROSCOPIC MOBILIZATION OF SPLENIC FLEXURE, LAPAROSCOPIC EXTENDED LEFT COLECTOMY;  Surgeon: Atilano Ina, MD;  Location: [facility] OR;  Service: General;  Laterality: N/A;   COLON SURGERY N/A    Phreesia 06/04/2020   COLONOSCOPY N/A 10/12/2013   Procedure: COLONOSCOPY;  Surgeon: Beverley Fiedler, MD;  Location: [facility] ENDOSCOPY;  Service: Endoscopy;  Laterality: N/A;   COLONOSCOPY WITH PROPOFOL N/A 02/21/2020   Procedure: COLONOSCOPY WITH PROPOFOL;  Surgeon: Iva Boop, MD;  Location: [facility] ENDOSCOPY;  Service: Endoscopy;  Laterality: N/A;    DILATION AND CURETTAGE, DIAGNOSTIC / THERAPEUTIC  1987   OPEN REDUCTION INTERNAL FIXATION (ORIF) DISTAL RADIAL FRACTURE Right 08/08/2014   Procedure: OPEN REDUCTION INTERNAL FIXATION (ORIF) DISTAL RADIAL FRACTURE;  Surgeon: Dominica Severin, MD;  Location: [facility] OR;  Service: Orthopedics;  Laterality: Right;  DVR Crosslock, MD available sometime around 1430-1500   ORIF ANKLE FRACTURE Right 11/10/2019   Procedure: POSSIBLEOPEN REDUCTION INTERNAL FIXATION (ORIF) ANKLE FRACTURE;  Surgeon: Roby Lofts, MD;  Location: [facility] OR;  Service: Orthopedics;  Laterality: Right;   ORIF WRIST FRACTURE Left 11/10/2019   Procedure: OPEN REDUCTION INTERNAL FIXATION (ORIF) WRIST FRACTURE;  Surgeon: Roby Lofts, MD;  Location: [facility] OR;  Service: Orthopedics;  Laterality: Left;   TONSILLECTOMY     TOTAL KNEE ARTHROPLASTY Right 08/05/2016   Procedure: RIGHT TOTAL KNEE ARTHROPLASTY;  Surgeon: Kathryne Hitch, MD;  Location: [facility] OR;  Service: Orthopedics;  Laterality: Right;   Patient Active Problem List   Diagnosis Date Noted   Spondylolisthesis of lumbar region 07/22/2022   Ischemic stricture intestine (HCC)    Colitis 02/19/2020   Closed fracture of left distal radius 11/10/2019   Fall 11/10/2019   Closed right ankle fracture 11/09/2019   Ankle fracture 11/09/2019   Closed displaced fracture of styloid  process of left ulna    Anxiety and depression 12/22/2016   Insulin resistance 10/15/2016   Presence of right artificial knee joint 10/14/2016   Vitamin D deficiency 04/08/2016   Constipation 09/20/2013   Recurrent cold sores    Osteoarthritis    Depression    Hyperlipidemia     PCP: Dr Terance Ice  REFERRING PROVIDER: Dr Lisbeth Renshaw  REFERRING DIAG: lumbar spinal stenosis  Rationale for Evaluation and Treatment: Rehabilitation  THERAPY DIAG:  Chronic bilateral low back pain with right-sided sciatica  Muscle spasm of back  Other symptoms and signs involving the musculoskeletal  system  Difficulty in walking, not elsewhere classified  ONSET DATE: 07/22/22  SUBJECTIVE:                                                                                                                                                                                           SUBJECTIVE STATEMENT: Patient reports her low back is sore today, states it tends to feel tight by end of the day.  PERTINENT HISTORY:  LBP for 10 years on and off with significant pain in the past 1.5 years; arthritis; R TKA; L knee pain; fx bilat wrists ORIF; bowel resection; idiopathic constipation; depression; obesity  PAIN:  Are you having pain? Yes: NPRS scale: 1-2/10 Pain location: lower back; R side of spine  Pain description: aching; burning; muscle  Aggravating factors: walking Relieving factors: sitting; lying in the bed; muscle relaxer   PRECAUTIONS: post op lumbar fusion- to remain in lumbar brace until RTD 10/06/22; out of brace to sleep; no lifting; no bending hands past knees   WEIGHT BEARING RESTRICTIONS: No  FALLS:  Has patient fallen in last 6 months? 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   PATIENT SURVEYS:  FOTO 25; goal 59  SCREENING FOR RED FLAGS: Bowel or bladder incontinence: No Spinal tumors: No Cauda equina syndrome: No Compression fracture: No Abdominal aneurysm: No  COGNITION: Overall cognitive status: Within functional  limits for tasks assessed     SENSATION: WFL  MUSCLE LENGTH: Hamstrings: Right 75 deg; Left  70 deg Thomas test: tight bilat   POSTURE: rounded shoulders, forward head, and flexed trunk   PALPATION: Muscular tightness noted through hip flexors bilat; Rt thoracolumbar paraspinals  (Patient in lumbar brace limiting palpation)   LUMBAR ROM:   AROM eval  Flexion Fingers to knees   Extension Neutral  Right lateral flexion 60% pulling pain L LB  Left lateral flexion 65% cramping L LB  Right rotation NT  Left rotation NT   (Blank rows = not tested)  LOWER EXTREMITY ROM:  hip mobility WFL's except hip extension which is limited   (ROM generally limited by obesity)  Active  Right eval Left eval  Hip flexion    Hip extension    Hip abduction    Hip adduction    Hip internal rotation    Hip external rotation    Knee flexion    Knee extension    Ankle dorsiflexion    Ankle plantarflexion    Ankle inversion    Ankle eversion     (Blank rows = not tested)  LOWER EXTREMITY MMT:  assessed in sitting and supine - patient unable to lie sidelying or prone   MMT Right eval Left eval  Hip flexion 4 4  Hip extension 4- 4-  Hip abduction 4 4  Hip adduction    Hip internal rotation    Hip external rotation    Knee flexion 5 5  Knee extension 5 5  Ankle dorsiflexion    Ankle plantarflexion    Ankle inversion    Ankle eversion     (Blank rows = not tested)  LUMBAR SPECIAL TESTS:  Straight leg raise test: Negative and Slump test: Negative  FUNCTIONAL TESTS:  5 times sit to stand: 18.46 sec using UE's to move sit to stand; reports cramping R side   GAIT: Distance walked: 40 Assistive device utilized: Quad cane small base Level of assistance: Complete Independence Comments: wide based gait; flexed forward at hips; limp R LE during Wt bearing R    [facility] Adult PT Treatment:                                                DATE: 09/16/2022 Therapeutic Exercise: NuStep L6 x  Mat Table: TA activation + breathing Hooklying ball squeeze 10x5" Hooklying hip abd iso press (gait belt) 10x5" PPT x 8 --> APT x5 --> A/P pelvic tilts x6 Supine bridges + ball squeeze x10 Seated clamshells GTB (unilateral) 2x10 STS hands braced on thighs --> cueing exhale when standing x10 Side stepping GTB above knees (counter) --> YTB crossed at ankles    [facility] Adult PT Treatment:                                                DATE: 09/10/2022 Therapeutic Exercise: NuStep L5 x Mat Table: Abdominal bracing Bridges --> bridges with ball squeeze x10 Bent knee fall out GTB x10 B hooklying clamshell GTB x10 HS/ITB stretches 2x30" each  STS 3x5 (hands braced on thighs)  Standing hip abd x10 B --> RTB crossed at ankles x10 B Seated red SB roll out front & diagonals     [facility] Adult PT Treatment:  DATE: 09/04/2022 Therapeutic Exercise: Supine: Abdominal bracing --> TA activation Bridges x10 HS/ITB stretches 2x30" each   Hooklying hip add iso ball squeeze 10x5" Unilateral clamshell GTB (focus on opp hip stability) x10  Seated: Red SB roll out with hip hinge x10 STS 2x10 (hands braced on thighs) NuStep L5 x    PATIENT EDUCATION:  Education details: Updated HEP  Person educated: Patient Education method: Explanation, Demonstration, Tactile cues, Verbal cues, and Handouts Education comprehension: verbalized understanding, returned demonstration, verbal cues required, tactile cues required, and needs further education  HOME EXERCISE PROGRAM: Access Code: [email or web address] URL: [email or web address] Date: 09/16/2022 Prepared by: Carlynn Herald  Exercises - Supine Transversus Abdominis Bracing with Pelvic Floor Contraction  - 2 x daily - 7 x weekly - 1 sets - 10 reps - 10sec  hold - Beginner Bridge  - 2 x daily - 7 x weekly - 1 sets - 10 reps - 3-5 sec  hold - Hooklying Isometric Clamshell  - 2 x daily - 7 x  weekly - 1 sets - 10 reps - 3 sec  hold - Supine Hip Adduction Isometric with Ball  - 1 x daily - 7 x weekly - 3 sets - 10 reps - Sit to Stand with Hands on Knees  - 1 x daily - 7 x weekly - 3 sets - 10 reps - Supine Hamstring Stretch with Strap  - 1 x daily - 7 x weekly - 1 sets - 3-5 reps - 30 sec hold - Supine ITB Stretch with Strap  - 1 x daily - 7 x weekly - 1 sets - 3-5 reps - 30 sec hold - Seated Quadratus Lumborum Stretch with Forward Bend  - 1 x daily - 7 x weekly - 2 sets - 10 reps - 5-10 sec hold - Seated Flexion Stretch with Swiss Ball  - 1 x daily - 7 x weekly - 2 sets - 10 reps - 5-10 sec hold - Supine Posterior Pelvic Tilt  - 1 x daily - 7 x weekly - 3 sets - 10 reps - Seated Single Leg Hip Abduction with Resistance  - 1 x daily - 7 x weekly - 3 sets - 10 reps - Side Stepping with Resistance at Ankles and Counter Support  - 1 x daily - 7 x weekly - 3 sets - 10 reps  ASSESSMENT:  CLINICAL IMPRESSION: Exercises performed without lumbar brace to progress core stabilization and proprioceptive awareness of posture. Supine bridges with hip abd isometric activation attempted, however increased hamstring cramping on R limited exercise; modifying exercise with hip add isometric alleviated cramping and improved core stabilization. Patient able to complete all exercises with no exacerbation of symptoms.    OBJECTIVE IMPAIRMENTS: Abnormal gait, decreased activity tolerance, decreased balance, decreased mobility, decreased ROM, decreased strength, increased muscle spasms, impaired flexibility, improper body mechanics, postural dysfunction, obesity, and pain.   ACTIVITY LIMITATIONS: carrying, lifting, bending, sitting, standing, squatting, stairs, transfers, bed mobility, and locomotion level  PARTICIPATION LIMITATIONS: meal prep, cleaning, laundry, driving, shopping, and community activity  PERSONAL FACTORS: Fitness, Past/current experiences, Time since onset of injury/illness/exacerbation,  and comorbidities including obesity and prior R TKA are also affecting patient's functional outcome.   REHAB POTENTIAL: Good  CLINICAL DECISION MAKING: Stable/uncomplicated  EVALUATION COMPLEXITY: Low   GOALS: Goals reviewed with patient? Yes  SHORT TERM GOALS: Target date: 10/14/2022  Independent in initial HEP Baseline: Goal status: INITIAL  2.  Patient reports/demonstrates ability to stand for 5-10 min for light household and  grooming tasks  Baseline:  Goal status: INITIAL  3.  Independent in gait with least restrictive assistive device for 500 feet without back spasm  Baseline:  Goal status: INITIAL  LONG TERM GOALS: Target date: 11/25/2022  Increase core strength and stability with patient to demonstrate ability to participate in 20 min of exercise without increase in symptoms  Baseline:  Goal status: INITIAL  2.  4+/5 to 5/5 strength bilat LE's  Baseline:  Goal status: INITIAL  3.  Decrease sit to stand times test by 3-5 seconds  Baseline:  Goal status: INITIAL  4.  Patient demonstrates/reports ability to ambulate community distances for functional tasks with least restrictive assistive device (at least 1000 feet)  Baseline:  Goal status: INITIAL  5.  Patient reports ability to do laundry at home  Baseline:  Goal status: INITIAL  6.  Independent in HEP including aquatic program as indicated  Baseline:  Goal status: INITIAL  7.  Improve functional limitation score to 42  Baseline: 25  Goal status: INITIAL   PLAN:  PT FREQUENCY: 2x/week  PT DURATION: 12 weeks  PLANNED INTERVENTIONS: Therapeutic exercises, Therapeutic activity, Neuromuscular re-education, Balance training, Gait training, Patient/Family education, Self Care, Joint mobilization, Aquatic Therapy, Dry Needling, Electrical stimulation, Spinal mobilization, Cryotherapy, Moist heat, Taping, Ultrasound, Ionotophoresis 4mg /ml Dexamethasone, Manual therapy, and Re-evaluation.  PLAN FOR NEXT  SESSION: No side lying or quadruped (too painful). Progress exercises as toerated; continue spine care and body mechanics education; manual work, DN, modalities as indicated   Sanjuana Mae, PTA 09/16/2022, 11:47 AM

## 2022-09-19 ENCOUNTER — Ambulatory Visit: Payer: Medicare Other

## 2022-09-19 DIAGNOSIS — R262 Difficulty in walking, not elsewhere classified: Secondary | ICD-10-CM

## 2022-09-19 DIAGNOSIS — R29898 Other symptoms and signs involving the musculoskeletal system: Secondary | ICD-10-CM

## 2022-09-19 DIAGNOSIS — R252 Cramp and spasm: Secondary | ICD-10-CM

## 2022-09-19 DIAGNOSIS — G8929 Other chronic pain: Secondary | ICD-10-CM

## 2022-09-19 DIAGNOSIS — M6281 Muscle weakness (generalized): Secondary | ICD-10-CM

## 2022-09-19 DIAGNOSIS — M62830 Muscle spasm of back: Secondary | ICD-10-CM

## 2022-09-19 DIAGNOSIS — M5441 Lumbago with sciatica, right side: Secondary | ICD-10-CM | POA: Diagnosis not present

## 2022-09-19 NOTE — Therapy (Signed)
OUTPATIENT PHYSICAL THERAPY THORACOLUMBAR TREATMENT   Patient Name: Monique Gonzalez MRN: [id number] DOB:August 27, 1955, 67 y.o., female Today's Date: 09/19/2022  END OF SESSION:  PT End of Session - 09/19/22 1108     Visit Number 5    Number of Visits 24    Date for PT Re-Evaluation 11/25/22    Authorization Type medicare & [employer] of [place]    Progress Note Due on Visit 10    PT Start Time 1105    PT Stop Time 1145    PT Time Calculation (min) 40 min    Activity Tolerance Patient tolerated treatment well    Behavior During Therapy [facility] for tasks assessed/performed             Past Medical History:  Diagnosis Date   Allergy    Anxiety    Phreesia 06/04/2020   Arthritis    Phreesia 07/08/2020   Chronic pain    back and knee   Colitis 09/2012   infectious vs inflammatory.    Colon stricture [facility])    Depression    Depression    Phreesia 06/04/2020   Depression    Phreesia 07/08/2020   GERD (gastroesophageal reflux disease)    Phreesia 06/04/2020   Hyperlipidemia    Obesity    Osteoarthritis    Osteopenia    Recurrent cold sores    Seasonal allergies    Syncope and collapse 11/30/2012   Normal EEG.  Vaso vagal syncope   Past Surgical History:  Procedure Laterality Date   BIOPSY  02/21/2020   Procedure: BIOPSY;  Surgeon: Iva Boop, MD;  Location: [facility] ENDOSCOPY;  Service: Endoscopy;;   COLON RESECTION N/A 10/14/2013   Procedure: LAPAROSCOPIC MOBILIZATION OF SPLENIC FLEXURE, LAPAROSCOPIC EXTENDED LEFT COLECTOMY;  Surgeon: Atilano Ina, MD;  Location: [facility] OR;  Service: General;  Laterality: N/A;   COLON SURGERY N/A    Phreesia 06/04/2020   COLONOSCOPY N/A 10/12/2013   Procedure: COLONOSCOPY;  Surgeon: Beverley Fiedler, MD;  Location: [facility] ENDOSCOPY;  Service: Endoscopy;  Laterality: N/A;   COLONOSCOPY WITH PROPOFOL N/A 02/21/2020   Procedure: COLONOSCOPY WITH PROPOFOL;  Surgeon: Iva Boop, MD;  Location: [facility] ENDOSCOPY;  Service: Endoscopy;  Laterality: N/A;    DILATION AND CURETTAGE, DIAGNOSTIC / THERAPEUTIC  1987   OPEN REDUCTION INTERNAL FIXATION (ORIF) DISTAL RADIAL FRACTURE Right 08/08/2014   Procedure: OPEN REDUCTION INTERNAL FIXATION (ORIF) DISTAL RADIAL FRACTURE;  Surgeon: Dominica Severin, MD;  Location: [facility] OR;  Service: Orthopedics;  Laterality: Right;  DVR Crosslock, MD available sometime around 1430-1500   ORIF ANKLE FRACTURE Right 11/10/2019   Procedure: POSSIBLEOPEN REDUCTION INTERNAL FIXATION (ORIF) ANKLE FRACTURE;  Surgeon: Roby Lofts, MD;  Location: [facility] OR;  Service: Orthopedics;  Laterality: Right;   ORIF WRIST FRACTURE Left 11/10/2019   Procedure: OPEN REDUCTION INTERNAL FIXATION (ORIF) WRIST FRACTURE;  Surgeon: Roby Lofts, MD;  Location: [facility] OR;  Service: Orthopedics;  Laterality: Left;   TONSILLECTOMY     TOTAL KNEE ARTHROPLASTY Right 08/05/2016   Procedure: RIGHT TOTAL KNEE ARTHROPLASTY;  Surgeon: Kathryne Hitch, MD;  Location: [facility] OR;  Service: Orthopedics;  Laterality: Right;   Patient Active Problem List   Diagnosis Date Noted   Spondylolisthesis of lumbar region 07/22/2022   Ischemic stricture intestine (HCC)    Colitis 02/19/2020   Closed fracture of left distal radius 11/10/2019   Fall 11/10/2019   Closed right ankle fracture 11/09/2019   Ankle fracture 11/09/2019   Closed displaced fracture of styloid  process of left ulna    Anxiety and depression 12/22/2016   Insulin resistance 10/15/2016   Presence of right artificial knee joint 10/14/2016   Vitamin D deficiency 04/08/2016   Constipation 09/20/2013   Recurrent cold sores    Osteoarthritis    Depression    Hyperlipidemia     PCP: Dr Terance Ice  REFERRING PROVIDER: Dr Lisbeth Renshaw  REFERRING DIAG: lumbar spinal stenosis  Rationale for Evaluation and Treatment: Rehabilitation  THERAPY DIAG:  Chronic bilateral low back pain with right-sided sciatica  Muscle spasm of back  Other symptoms and signs involving the musculoskeletal  system  Difficulty in walking, not elsewhere classified  Muscle weakness (generalized)  Cramp and spasm  ONSET DATE: 07/22/22  SUBJECTIVE:                                                                                                                                                                                           SUBJECTIVE STATEMENT: Patient reports she felt fine after last visit doing exercise without back brace. Patient states she overdid it yesterday with walking around stores on an outing with her sisters and is sore in legs today.  PERTINENT HISTORY:  LBP for 10 years on and off with significant pain in the past 1.5 years; arthritis; R TKA; L knee pain; fx bilat wrists ORIF; bowel resection; idiopathic constipation; depression; obesity  PAIN:  Are you having pain? Yes: NPRS scale: 1-2/10 Pain location: lower back; R side of spine  Pain description: aching; burning; muscle  Aggravating factors: walking Relieving factors: sitting; lying in the bed; muscle relaxer   PRECAUTIONS: post op lumbar fusion- to remain in lumbar brace until RTD 10/06/22; out of brace to sleep; no lifting; no bending hands past knees   WEIGHT BEARING RESTRICTIONS: No  FALLS:  Has patient fallen in last 6 months? 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   PATIENT SURVEYS:  FOTO 25; goal 53  SCREENING FOR RED FLAGS: Bowel or bladder  incontinence: No Spinal tumors: No Cauda equina syndrome: No Compression fracture: No Abdominal aneurysm: No  COGNITION: Overall cognitive status: Within functional limits for tasks assessed     SENSATION: WFL  MUSCLE LENGTH: Hamstrings: Right 75 deg; Left 70 deg Thomas test: tight bilat   POSTURE: rounded shoulders, forward head, and flexed trunk   PALPATION: Muscular tightness noted through hip flexors bilat; Rt thoracolumbar paraspinals  (Patient in lumbar brace limiting palpation)   LUMBAR ROM:   AROM eval  Flexion Fingers to knees   Extension Neutral  Right lateral flexion 60% pulling pain L LB  Left lateral flexion 65% cramping L LB  Right rotation NT  Left rotation NT   (Blank rows = not tested)  LOWER EXTREMITY ROM:  hip mobility WFL's except hip extension which is limited   (ROM generally limited by obesity)  Active  Right eval Left eval  Hip flexion    Hip extension    Hip abduction    Hip adduction    Hip internal rotation    Hip external rotation    Knee flexion    Knee extension    Ankle dorsiflexion    Ankle plantarflexion    Ankle inversion    Ankle eversion     (Blank rows = not tested)  LOWER EXTREMITY MMT:  assessed in sitting and supine - patient unable to lie sidelying or prone   MMT Right eval Left eval  Hip flexion 4 4  Hip extension 4- 4-  Hip abduction 4 4  Hip adduction    Hip internal rotation    Hip external rotation    Knee flexion 5 5  Knee extension 5 5  Ankle dorsiflexion    Ankle plantarflexion    Ankle inversion    Ankle eversion     (Blank rows = not tested)  LUMBAR SPECIAL TESTS:  Straight leg raise test: Negative and Slump test: Negative  FUNCTIONAL TESTS:  5 times sit to stand: 18.46 sec using UE's to move sit to stand; reports cramping R side   GAIT: Distance walked: 40 Assistive device utilized: Quad cane small base Level of assistance: Complete Independence Comments: wide based gait; flexed  forward at hips; limp R LE during Wt bearing R    [facility] Adult PT Treatment:                                                DATE: 09/19/2022 Therapeutic Exercise: NuStep L6 x Supine: Hooklying ball squeeze 10x5" A/P pelvic tilts x10 Supine bridges + ball squeeze 2x10 Hooklying glute set --> added hip add iso ball squeeze SKTC  Seated:  Clamshells GTB bilateral x10 --> unilateral x10 each Seated dead lift --> added GTB resistance when coming up x10 STS hands braced on thighs --> cueing exhale when standing x10 Side stepping GTB above knees (counter) --> YTB crossed at ankles    [facility] Adult PT Treatment:                                                DATE: 09/16/2022 Therapeutic Exercise: NuStep L6 x Mat Table: TA activation + breathing Hooklying ball squeeze 10x5" Hooklying hip abd iso press (gait belt) 10x5" PPT x 8 --> APT x5 --> A/P pelvic tilts x6 Supine bridges + ball squeeze x10 Seated  clamshells GTB (unilateral) 2x10 STS hands braced on thighs --> cueing exhale when standing x10 Side stepping GTB above knees (counter) --> YTB crossed at ankles   PATIENT EDUCATION:  Education details: Updated HEP  Person educated: Patient Education method: Explanation, Demonstration, Tactile cues, Verbal cues, and Handouts Education comprehension: verbalized understanding, returned demonstration, verbal cues required, tactile cues required, and needs further education  HOME EXERCISE PROGRAM: Access Code: [email or web address] URL: [email or web address] Date: 09/19/2022 Prepared by: Carlynn Herald  Exercises - Supine Posterior Pelvic Tilt  - 1 x daily - 7 x weekly - 3 sets - 10 reps - Supine Single Knee to Chest Stretch  - 1 x daily - 7 x weekly - 1 sets - 3-5 reps - 10-30 sec hold - Supine Hip Adduction Isometric with Ball  - 1 x daily - 7 x weekly - 1 sets - 5 reps - Supine Bridge with Mini Swiss Ball Between Knees  - 1 x daily - 7 x weekly - 3 sets - 10 reps - 3 sec  hold - Supine Hamstring Stretch with Strap  - 1 x daily - 7 x weekly - 1 sets - 3-5 reps - 30 sec hold - Supine ITB Stretch with Strap  - 1 x daily - 7 x weekly - 1 sets - 3-5 reps - 30 sec hold - Seated Quadratus Lumborum Stretch with Forward Bend  - 1 x daily - 7 x weekly - 2 sets - 10 reps - 5-10 sec hold - Seated Flexion Stretch with Swiss Ball  - 1 x daily - 7 x weekly - 2 sets - 10 reps - 5-10 sec hold - Seated Single Leg Hip Abduction with Resistance  - 1 x daily - 7 x weekly - 3 sets - 10 reps - Side Stepping with Resistance at Ankles and Counter Support  - 1 x daily - 7 x weekly - 3 sets - 10 reps - Squat with Chair Touch  - 1 x daily - 7 x weekly - 3 sets - 10 reps  ASSESSMENT:  CLINICAL IMPRESSION: Cueing improved glute and TA activation during bridge progression. Seated dead lift exercise progressed with focus on hip hinge mechanics and added resistance to challenge eccentric/concentric control. Patient fatigued quickly with standing mini squats.    OBJECTIVE IMPAIRMENTS: Abnormal gait, decreased activity tolerance, decreased balance, decreased mobility, decreased ROM, decreased strength, increased muscle spasms, impaired flexibility, improper body mechanics, postural dysfunction, obesity, and pain.   ACTIVITY LIMITATIONS: carrying, lifting, bending, sitting, standing, squatting, stairs, transfers, bed mobility, and locomotion level  PARTICIPATION LIMITATIONS: meal prep, cleaning, laundry, driving, shopping, and community activity  PERSONAL FACTORS: Fitness, Past/current experiences, Time since onset of injury/illness/exacerbation, and comorbidities including obesity and prior R TKA are also affecting patient's functional outcome.   REHAB POTENTIAL: Good  CLINICAL DECISION MAKING: Stable/uncomplicated  EVALUATION COMPLEXITY: Low   GOALS: Goals reviewed with patient? Yes  SHORT TERM GOALS: Target date: 10/14/2022  Independent in initial HEP Baseline: Goal status:  INITIAL  2.  Patient reports/demonstrates ability to stand for 5-10 min for light household and grooming tasks  Baseline:  Goal status: INITIAL  3.  Independent in gait with least restrictive assistive device for 500 feet without back spasm  Baseline:  Goal status: INITIAL  LONG TERM GOALS: Target date: 11/25/2022  Increase core strength and stability with patient to demonstrate ability to participate in 20 min of exercise without increase in symptoms  Baseline:  Goal status: INITIAL  2.  4+/5 to 5/5  strength bilat LE's  Baseline:  Goal status: INITIAL  3.  Decrease sit to stand times test by 3-5 seconds  Baseline:  Goal status: INITIAL  4.  Patient demonstrates/reports ability to ambulate community distances for functional tasks with least restrictive assistive device (at least 1000 feet)  Baseline:  Goal status: INITIAL  5.  Patient reports ability to do laundry at home  Baseline:  Goal status: INITIAL  6.  Independent in HEP including aquatic program as indicated  Baseline:  Goal status: INITIAL  7.  Improve functional limitation score to 42  Baseline: 25  Goal status: INITIAL   PLAN:  PT FREQUENCY: 2x/week  PT DURATION: 12 weeks  PLANNED INTERVENTIONS: Therapeutic exercises, Therapeutic activity, Neuromuscular re-education, Balance training, Gait training, Patient/Family education, Self Care, Joint mobilization, Aquatic Therapy, Dry Needling, Electrical stimulation, Spinal mobilization, Cryotherapy, Moist heat, Taping, Ultrasound, Ionotophoresis 4mg /ml Dexamethasone, Manual therapy, and Re-evaluation.  PLAN FOR NEXT SESSION: No side lying or quadruped (too painful). Progress exercises as toerated; continue spine care and body mechanics education; manual work, DN, modalities as indicated   Sanjuana Mae, PTA 09/19/2022, 11:50 AM

## 2022-09-22 ENCOUNTER — Ambulatory Visit: Payer: Medicare Other | Admitting: Rehabilitative and Restorative Service Providers"

## 2022-09-22 ENCOUNTER — Encounter: Payer: Self-pay | Admitting: Rehabilitative and Restorative Service Providers"

## 2022-09-22 DIAGNOSIS — G8929 Other chronic pain: Secondary | ICD-10-CM

## 2022-09-22 DIAGNOSIS — R262 Difficulty in walking, not elsewhere classified: Secondary | ICD-10-CM

## 2022-09-22 DIAGNOSIS — R252 Cramp and spasm: Secondary | ICD-10-CM

## 2022-09-22 DIAGNOSIS — M6281 Muscle weakness (generalized): Secondary | ICD-10-CM

## 2022-09-22 DIAGNOSIS — M62830 Muscle spasm of back: Secondary | ICD-10-CM

## 2022-09-22 DIAGNOSIS — M5441 Lumbago with sciatica, right side: Secondary | ICD-10-CM | POA: Diagnosis not present

## 2022-09-22 DIAGNOSIS — R29898 Other symptoms and signs involving the musculoskeletal system: Secondary | ICD-10-CM

## 2022-09-22 NOTE — Therapy (Signed)
OUTPATIENT PHYSICAL THERAPY THORACOLUMBAR TREATMENT   Patient Name: Monique Gonzalez MRN: [id number] DOB:[birth year], 67 y.o., female Today's Date: 09/22/2022  END OF SESSION:  PT End of Session - 09/22/22 1317     Visit Number 6    Number of Visits 24    Date for PT Re-Evaluation 11/25/22    Authorization Type medicare & [employer] of [place]    Progress Note Due on Visit 10    PT Start Time 1317    PT Stop Time 1405    PT Time Calculation (min) 48 min    Activity Tolerance Patient tolerated treatment well             Past Medical History:  Diagnosis Date   Allergy    Anxiety    Phreesia 06/04/2020   Arthritis    Phreesia 07/08/2020   Chronic pain    back and knee   Colitis 09/2012   infectious vs inflammatory.    Colon stricture [facility])    Depression    Depression    Phreesia 06/04/2020   Depression    Phreesia 07/08/2020   GERD (gastroesophageal reflux disease)    Phreesia 06/04/2020   Hyperlipidemia    Obesity    Osteoarthritis    Osteopenia    Recurrent cold sores    Seasonal allergies    Syncope and collapse 11/30/2012   Normal EEG.  Vaso vagal syncope   Past Surgical History:  Procedure Laterality Date   BIOPSY  02/21/2020   Procedure: BIOPSY;  Surgeon: Iva Boop, MD;  Location: [facility] ENDOSCOPY;  Service: Endoscopy;;   COLON RESECTION N/A 10/14/2013   Procedure: LAPAROSCOPIC MOBILIZATION OF SPLENIC FLEXURE, LAPAROSCOPIC EXTENDED LEFT COLECTOMY;  Surgeon: Atilano Ina, MD;  Location: [facility] OR;  Service: General;  Laterality: N/A;   COLON SURGERY N/A    Phreesia 06/04/2020   COLONOSCOPY N/A 10/12/2013   Procedure: COLONOSCOPY;  Surgeon: Beverley Fiedler, MD;  Location: [facility] ENDOSCOPY;  Service: Endoscopy;  Laterality: N/A;   COLONOSCOPY WITH PROPOFOL N/A 02/21/2020   Procedure: COLONOSCOPY WITH PROPOFOL;  Surgeon: Iva Boop, MD;  Location: [facility] ENDOSCOPY;  Service: Endoscopy;  Laterality: N/A;   DILATION AND CURETTAGE, DIAGNOSTIC / THERAPEUTIC  1987   OPEN  REDUCTION INTERNAL FIXATION (ORIF) DISTAL RADIAL FRACTURE Right 08/08/2014   Procedure: OPEN REDUCTION INTERNAL FIXATION (ORIF) DISTAL RADIAL FRACTURE;  Surgeon: Dominica Severin, MD;  Location: [facility] OR;  Service: Orthopedics;  Laterality: Right;  DVR Crosslock, MD available sometime around 1430-1500   ORIF ANKLE FRACTURE Right 11/10/2019   Procedure: POSSIBLEOPEN REDUCTION INTERNAL FIXATION (ORIF) ANKLE FRACTURE;  Surgeon: Roby Lofts, MD;  Location: [facility] OR;  Service: Orthopedics;  Laterality: Right;   ORIF WRIST FRACTURE Left 11/10/2019   Procedure: OPEN REDUCTION INTERNAL FIXATION (ORIF) WRIST FRACTURE;  Surgeon: Roby Lofts, MD;  Location: [facility] OR;  Service: Orthopedics;  Laterality: Left;   TONSILLECTOMY     TOTAL KNEE ARTHROPLASTY Right 08/05/2016   Procedure: RIGHT TOTAL KNEE ARTHROPLASTY;  Surgeon: Kathryne Hitch, MD;  Location: [facility] OR;  Service: Orthopedics;  Laterality: Right;   Patient Active Problem List   Diagnosis Date Noted   Spondylolisthesis of lumbar region 07/22/2022   Ischemic stricture intestine (HCC)    Colitis 02/19/2020   Closed fracture of left distal radius 11/10/2019   Fall 11/10/2019   Closed right ankle fracture 11/09/2019   Ankle fracture 11/09/2019   Closed displaced fracture of styloid process of left ulna    Anxiety and depression  12/22/2016   Insulin resistance 10/15/2016   Presence of right artificial knee joint 10/14/2016   Vitamin D deficiency 04/08/2016   Constipation 09/20/2013   Recurrent cold sores    Osteoarthritis    Depression    Hyperlipidemia     PCP: Dr Terance Ice  REFERRING PROVIDER: Dr Lisbeth Renshaw  REFERRING DIAG: lumbar spinal stenosis  Rationale for Evaluation and Treatment: Rehabilitation  THERAPY DIAG:  Chronic bilateral low back pain with right-sided sciatica  Muscle spasm of back  Other symptoms and signs involving the musculoskeletal system  Difficulty in walking, not elsewhere  classified  Muscle weakness (generalized)  Cramp and spasm  ONSET DATE: 07/22/22  SUBJECTIVE:                                                                                                                                                                                           SUBJECTIVE STATEMENT: L knee is really flared up. It has been bothering her for a while. Has arthritis. Now that they have fixed her back the knee is hurting more as she is doing more. Patient reports she is doing exercises without back brace now and that is going OK. She is doing more around the house and can do some chores at home.   PERTINENT HISTORY:  LBP for 10 years on and off with significant pain in the past 1.5 years; arthritis; R TKA; L knee pain; fx bilat wrists ORIF; bowel resection; idiopathic constipation; depression; obesity  PAIN:  Are you having pain? Yes: NPRS scale: 0/10 Pain location: lower back; R side of spine  Pain description: aching; burning; muscle  Aggravating factors: walking Relieving factors: sitting; lying in the bed; muscle relaxer   PRECAUTIONS: post op lumbar fusion- to remain in lumbar brace until RTD 10/06/22; out of brace to sleep; no lifting; no bending hands past knees   WEIGHT BEARING RESTRICTIONS: No  FALLS:  Has patient fallen in last 6 months? 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   PATIENT SURVEYS:  FOTO 25;  goal 33    SENSATION: WFL  MUSCLE LENGTH: Hamstrings: Right 75 deg; Left 70 deg Thomas test: tight bilat   POSTURE: rounded shoulders, forward head, and flexed trunk   PALPATION: Muscular tightness noted through hip flexors bilat; Rt thoracolumbar paraspinals  (Patient in lumbar brace limiting palpation)   LUMBAR ROM:   AROM eval  Flexion Fingers to knees   Extension Neutral  Right lateral flexion 60% pulling pain L LB  Left lateral flexion 65% cramping L LB  Right rotation NT  Left rotation NT   (Blank rows = not tested)  LOWER EXTREMITY ROM:  hip mobility WFL's except hip extension which is limited   (ROM generally limited by obesity)  Active  Right eval Left eval  Hip flexion    Hip extension    Hip abduction    Hip adduction    Hip internal rotation    Hip external rotation    Knee flexion    Knee extension    Ankle dorsiflexion    Ankle plantarflexion    Ankle inversion    Ankle eversion     (Blank rows = not tested)  LOWER EXTREMITY MMT:  assessed in sitting and supine - patient unable to lie sidelying or prone   MMT Right eval Left eval  Hip flexion 4 4  Hip extension 4- 4-  Hip abduction 4 4  Hip adduction    Hip internal rotation    Hip external rotation    Knee flexion 5 5  Knee extension 5 5  Ankle dorsiflexion    Ankle plantarflexion    Ankle inversion    Ankle eversion     (Blank rows = not tested)  LUMBAR SPECIAL TESTS:  Straight leg raise test: Negative and Slump test: Negative  FUNCTIONAL TESTS:  5 times sit to stand: 18.46 sec using UE's to move sit to stand; reports cramping R side   GAIT: Distance walked: 40 Assistive device utilized: Quad cane small base Level of assistance: Complete Independence Comments: wide based gait; flexed forward at hips; limp R LE during Wt bearing R    [facility] Adult PT Treatment:                                                DATE: 09/22/2022 Therapeutic Exercise: NuStep L6 x  Supine: Piriformis stretch travell 30 sec x 3(foot resting on opposite thigh) Hip abduction in hooklying green TB 3 sec x 10 R/L (issued blue TB for home) Hooklying ball squeeze 5 sec x 10  A/P pelvic tilts x10 Supine bridges + ball squeeze 3 sec 10  Supine bridges + hip abduction blue TB 3 sec x 10  Hooklying glute set --> added hip add iso ball squeeze 3 sec x 10  Single knee to chest  Bilat shoulder flexion holding 8# DB btn hands 90 deg flexion to overhead x 10 - VC to engage core  Seated:  Sit to stand VC to engage core x 10   Standing  Partial squat 3 sec pause x 8  Row green 3 sec x 10  Shoulder extension 3 sec x 10   [facility] Adult PT Treatment:  DATE: 09/19/2022 Therapeutic Exercise: NuStep L6 x Supine: Hooklying ball squeeze 10x5" A/P pelvic tilts x10 Supine bridges + ball squeeze 2x10 Hooklying glute set --> added hip add iso ball squeeze SKTC  Seated:  Clamshells GTB bilateral x10 --> unilateral x10 each Seated dead lift --> added GTB resistance when coming up x10 STS hands braced on thighs --> cueing exhale when standing x10 Side stepping GTB above knees (counter) --> YTB crossed at ankles  PATIENT EDUCATION:  Education details: Updated HEP  Person educated: Patient Education method: Explanation, Demonstration, Tactile cues, Verbal cues, and Handouts Education comprehension: verbalized understanding, returned demonstration, verbal cues required, tactile cues required, and needs further education  HOME EXERCISE PROGRAM: Access Code: [email or web address] URL: [email or web address] Date: 09/22/2022 Prepared by: Corlis Leak  Exercises - Supine Posterior Pelvic Tilt  - 1 x daily - 7 x weekly - 3 sets - 10 reps - Supine Single Knee to Chest Stretch  - 1 x daily - 7 x weekly - 1 sets - 3-5 reps - 10-30 sec hold - Supine Hip Adduction Isometric with Ball  - 1 x daily - 7 x weekly - 1 sets - 5 reps - Supine  Bridge with Mini Swiss Ball Between Knees  - 1 x daily - 7 x weekly - 3 sets - 10 reps - 3 sec hold - Supine Hamstring Stretch with Strap  - 1 x daily - 7 x weekly - 1 sets - 3-5 reps - 30 sec hold - Supine ITB Stretch with Strap  - 1 x daily - 7 x weekly - 1 sets - 3-5 reps - 30 sec hold - Seated Quadratus Lumborum Stretch with Forward Bend  - 1 x daily - 7 x weekly - 2 sets - 10 reps - 5-10 sec hold - Seated Flexion Stretch with Swiss Ball  - 1 x daily - 7 x weekly - 2 sets - 10 reps - 5-10 sec hold - Seated Single Leg Hip Abduction with Resistance  - 1 x daily - 7 x weekly - 3 sets - 10 reps - Side Stepping with Resistance at Ankles and Counter Support  - 1 x daily - 7 x weekly - 3 sets - 10 reps - Squat with Chair Touch  - 1 x daily - 7 x weekly - 3 sets - 10 reps - Supine Piriformis Stretch with Leg Straight  - 1 x daily - 7 x weekly - 1 sets - 3 reps - 30 sec  hold - Hooklying Isometric Clamshell  - 1 x daily - 7 x weekly - 1 sets - 10 reps - 3 sec  hold - Standing Bilateral Low Shoulder Row with Anchored Resistance  - 2 x daily - 7 x weekly - 1-3 sets - 10 reps - 2-3 sec  hold - Shoulder extension with resistance - Neutral  - 1 x daily - 7 x weekly - 1-2 sets - 10 reps - 3-5 sec  hold - Hooklying Shoulder I  - 1 x daily - 7 x weekly - 1 sets - 10 reps - 3 sec  hold  ASSESSMENT:  CLINICAL IMPRESSION: Gradually progressing with core stabilization. Performing exercises without lumbar support without difficulty. Added exercises without difficulty. Encouraged continued consistent HEP and gradually weaning from lumbar support. Progressing well toward stated goals of therapy.   GOALS: Goals reviewed with patient? Yes  SHORT TERM GOALS: Target date: 10/14/2022  Independent in initial HEP Baseline: Goal status: INITIAL  2.  Patient reports/demonstrates ability  to stand for 5-10 min for light household and grooming tasks  Baseline:  Goal status: INITIAL  3.  Independent in gait with least  restrictive assistive device for 500 feet without back spasm  Baseline:  Goal status: INITIAL  LONG TERM GOALS: Target date: 11/25/2022  Increase core strength and stability with patient to demonstrate ability to participate in 20 min of exercise without increase in symptoms  Baseline:  Goal status: INITIAL  2.  4+/5 to 5/5 strength bilat LE's  Baseline:  Goal status: INITIAL  3.  Decrease sit to stand times test by 3-5 seconds  Baseline:  Goal status: INITIAL  4.  Patient demonstrates/reports ability to ambulate community distances for functional tasks with least restrictive assistive device (at least 1000 feet)  Baseline:  Goal status: INITIAL  5.  Patient reports ability to do laundry at home  Baseline:  Goal status: INITIAL  6.  Independent in HEP including aquatic program as indicated  Baseline:  Goal status: INITIAL  7.  Improve functional limitation score to 42  Baseline: 25  Goal status: INITIAL   PLAN:  PT FREQUENCY: 2x/week  PT DURATION: 12 weeks  PLANNED INTERVENTIONS: Therapeutic exercises, Therapeutic activity, Neuromuscular re-education, Balance training, Gait training, Patient/Family education, Self Care, Joint mobilization, Aquatic Therapy, Dry Needling, Electrical stimulation, Spinal mobilization, Cryotherapy, Moist heat, Taping, Ultrasound, Ionotophoresis 4mg /ml Dexamethasone, Manual therapy, and Re-evaluation.  PLAN FOR NEXT SESSION: No side lying or quadruped (too painful). Progress exercises as toerated; continue spine care and body mechanics education; manual work, DN, modalities as indicated   [employer], PT 09/22/2022, 1:19 PM

## 2022-09-24 ENCOUNTER — Ambulatory Visit: Payer: Medicare Other | Admitting: Rehabilitative and Restorative Service Providers"

## 2022-09-24 ENCOUNTER — Encounter: Payer: Self-pay | Admitting: Rehabilitative and Restorative Service Providers"

## 2022-09-24 DIAGNOSIS — R252 Cramp and spasm: Secondary | ICD-10-CM

## 2022-09-24 DIAGNOSIS — G8929 Other chronic pain: Secondary | ICD-10-CM

## 2022-09-24 DIAGNOSIS — R29898 Other symptoms and signs involving the musculoskeletal system: Secondary | ICD-10-CM

## 2022-09-24 DIAGNOSIS — M62830 Muscle spasm of back: Secondary | ICD-10-CM

## 2022-09-24 DIAGNOSIS — M6281 Muscle weakness (generalized): Secondary | ICD-10-CM

## 2022-09-24 DIAGNOSIS — R262 Difficulty in walking, not elsewhere classified: Secondary | ICD-10-CM

## 2022-09-24 DIAGNOSIS — M5441 Lumbago with sciatica, right side: Secondary | ICD-10-CM | POA: Diagnosis not present

## 2022-09-24 NOTE — Therapy (Signed)
OUTPATIENT PHYSICAL THERAPY THORACOLUMBAR TREATMENT   Patient Name: Monique Gonzalez MRN: [id number] DOB:05/14/1955, 67 y.o., female Today's Date: 09/24/2022  END OF SESSION:  PT End of Session - 09/24/22 1400     Visit Number 7    Number of Visits 24    Date for PT Re-Evaluation 11/25/22    Authorization Type medicare & mutual of [place]    Progress Note Due on Visit 10    PT Start Time 1358    PT Stop Time 1445    PT Time Calculation (min) 47 min    Activity Tolerance Patient tolerated treatment well             Past Medical History:  Diagnosis Date   Allergy    Anxiety    Phreesia 06/04/2020   Arthritis    Phreesia 07/08/2020   Chronic pain    back and knee   Colitis 09/2012   infectious vs inflammatory.    Colon stricture [facility])    Depression    Depression    Phreesia 06/04/2020   Depression    Phreesia 07/08/2020   GERD (gastroesophageal reflux disease)    Phreesia 06/04/2020   Hyperlipidemia    Obesity    Osteoarthritis    Osteopenia    Recurrent cold sores    Seasonal allergies    Syncope and collapse 11/30/2012   Normal EEG.  Vaso vagal syncope   Past Surgical History:  Procedure Laterality Date   BIOPSY  02/21/2020   Procedure: BIOPSY;  Surgeon: Iva Boop, MD;  Location: [facility] ENDOSCOPY;  Service: Endoscopy;;   COLON RESECTION N/A 10/14/2013   Procedure: LAPAROSCOPIC MOBILIZATION OF SPLENIC FLEXURE, LAPAROSCOPIC EXTENDED LEFT COLECTOMY;  Surgeon: Atilano Ina, MD;  Location: [facility] OR;  Service: General;  Laterality: N/A;   COLON SURGERY N/A    Phreesia 06/04/2020   COLONOSCOPY N/A 10/12/2013   Procedure: COLONOSCOPY;  Surgeon: Beverley Fiedler, MD;  Location: [facility] ENDOSCOPY;  Service: Endoscopy;  Laterality: N/A;   COLONOSCOPY WITH PROPOFOL N/A 02/21/2020   Procedure: COLONOSCOPY WITH PROPOFOL;  Surgeon: Iva Boop, MD;  Location: [facility] ENDOSCOPY;  Service: Endoscopy;  Laterality: N/A;   DILATION AND CURETTAGE, DIAGNOSTIC / THERAPEUTIC  1987   OPEN  REDUCTION INTERNAL FIXATION (ORIF) DISTAL RADIAL FRACTURE Right 08/08/2014   Procedure: OPEN REDUCTION INTERNAL FIXATION (ORIF) DISTAL RADIAL FRACTURE;  Surgeon: Dominica Severin, MD;  Location: [facility] OR;  Service: Orthopedics;  Laterality: Right;  DVR Crosslock, MD available sometime around 1430-1500   ORIF ANKLE FRACTURE Right 11/10/2019   Procedure: POSSIBLEOPEN REDUCTION INTERNAL FIXATION (ORIF) ANKLE FRACTURE;  Surgeon: Roby Lofts, MD;  Location: [facility] OR;  Service: Orthopedics;  Laterality: Right;   ORIF WRIST FRACTURE Left 11/10/2019   Procedure: OPEN REDUCTION INTERNAL FIXATION (ORIF) WRIST FRACTURE;  Surgeon: Roby Lofts, MD;  Location: [facility] OR;  Service: Orthopedics;  Laterality: Left;   TONSILLECTOMY     TOTAL KNEE ARTHROPLASTY Right 08/05/2016   Procedure: RIGHT TOTAL KNEE ARTHROPLASTY;  Surgeon: Kathryne Hitch, MD;  Location: [facility] OR;  Service: Orthopedics;  Laterality: Right;   Patient Active Problem List   Diagnosis Date Noted   Spondylolisthesis of lumbar region 07/22/2022   Ischemic stricture intestine (HCC)    Colitis 02/19/2020   Closed fracture of left distal radius 11/10/2019   Fall 11/10/2019   Closed right ankle fracture 11/09/2019   Ankle fracture 11/09/2019   Closed displaced fracture of styloid process of left ulna    Anxiety and depression  12/22/2016   Insulin resistance 10/15/2016   Presence of right artificial knee joint 10/14/2016   Vitamin D deficiency 04/08/2016   Constipation 09/20/2013   Recurrent cold sores    Osteoarthritis    Depression    Hyperlipidemia     PCP: Dr Terance Ice  REFERRING PROVIDER: Dr Lisbeth Renshaw  REFERRING DIAG: lumbar spinal stenosis  Rationale for Evaluation and Treatment: Rehabilitation  THERAPY DIAG:  Chronic bilateral low back pain with right-sided sciatica  Muscle spasm of back  Other symptoms and signs involving the musculoskeletal system  Difficulty in walking, not elsewhere  classified  Muscle weakness (generalized)  Cramp and spasm  ONSET DATE: 07/22/22  SUBJECTIVE:                                                                                                                                                                                           SUBJECTIVE STATEMENT: Back is feeling sore and aching today. Not sure what created the change but she took a motrin and that helped. Arthritic L knee is sore. She sees MD on Friday.  Now that they have fixed her back the knee is hurting more as she is doing more. Patient reports she is doing exercises without back brace now and that is going OK. She is doing more around the house and can do some chores at home.   PERTINENT HISTORY:  LBP for 10 years on and off with significant pain in the past 1.5 years; arthritis; R TKA; L knee pain; fx bilat wrists ORIF; bowel resection; idiopathic constipation; depression; obesity  PAIN:  Are you having pain? Yes: NPRS scale: 0/10; this am was 3-4/10  Pain location: lower back; R side of spine  Pain description: aching; burning; muscle  Aggravating factors: walking Relieving factors: sitting; lying in the bed; muscle relaxer   PRECAUTIONS: post op lumbar fusion- to remain in lumbar brace until RTD 10/06/22; out of brace to sleep; no lifting; no bending hands past knees   WEIGHT BEARING RESTRICTIONS: No  FALLS:  Has patient fallen in last 6 months? 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   PATIENT SURVEYS:  FOTO 25; goal 47    SENSATION: WFL  MUSCLE LENGTH: Hamstrings: Right 75 deg; Left 70 deg Thomas test: tight bilat   POSTURE: rounded shoulders, forward head, and flexed trunk   PALPATION: Muscular tightness noted through hip flexors bilat; Rt thoracolumbar paraspinals  (Patient in lumbar brace limiting palpation)   LUMBAR ROM:   AROM eval  Flexion Fingers to knees   Extension Neutral  Right lateral flexion 60% pulling pain L LB  Left lateral flexion 65% cramping L LB  Right rotation NT  Left rotation NT   (Blank rows = not tested)  LOWER EXTREMITY ROM:  hip mobility WFL's except hip extension which is limited   (ROM generally limited by obesity)  Active  Right eval Left eval  Hip flexion    Hip extension    Hip abduction    Hip adduction    Hip internal rotation    Hip external rotation    Knee flexion    Knee extension    Ankle dorsiflexion    Ankle plantarflexion    Ankle inversion    Ankle eversion     (Blank rows = not tested)  LOWER EXTREMITY MMT:  assessed in sitting and supine - patient unable to lie sidelying or prone   MMT Right eval Left eval  Hip flexion 4 4  Hip extension 4- 4-  Hip abduction 4 4  Hip adduction    Hip internal rotation    Hip external rotation    Knee flexion 5 5  Knee extension 5 5  Ankle dorsiflexion    Ankle plantarflexion    Ankle inversion    Ankle eversion     (Blank rows = not tested)  LUMBAR SPECIAL TESTS:  Straight leg raise test: Negative and Slump test: Negative  FUNCTIONAL TESTS:  5 times sit to stand: 18.46 sec using UE's to move sit to stand; reports cramping R side   GAIT: Distance walked: 40 Assistive device utilized: Quad cane small base Level of assistance: Complete Independence Comments: wide based gait; flexed forward at hips; limp R LE during Wt bearing R    [facility] Adult PT Treatment:                                                 DATE: 09/24/2022 Therapeutic Exercise: NuStep L6 x Supine: Piriformis stretch R travell with strap distal thigh 30 sec x 5(foot resting on opposite thigh) Hip abduction in hooklying green TB 3 sec x 10 R/L (issued blue TB for home) Supine bridges + hip abduction green TB 3 sec x 10  Hooklying ball squeeze 5 sec x 10  Supine bridges + ball squeeze 3 sec 10  Hooklying glute set with hip add isometric ball squeeze 3 sec x 10  Single knee to chest 5 sec x5 R/L Bilat shoulder flexion holding 8# DB btn hands 90 deg flexion to overhead x 10 - VC to engage core   Seated:  Hamstring stretch 30 sec x 2 Sit to stand VC to engage core x 10   Standing  B Shoulder flexion facing counter sliding hands up cabinet 3 sec x 10 Wall push up 3 sec x 10  Row green 3 sec x 10  Shoulder extension 3 sec x 10 Side stepping GTB  above knees (counter) --> YTB crossed at ankles Shoulder flexion at counter fingers climbing up cabinet core tight x 10    [facility] Adult PT Treatment:                                                DATE: 09/22/2022 Therapeutic Exercise: NuStep L6 x Supine: Piriformis stretch travell 30 sec x 3(foot resting on opposite thigh) Hip abduction in hooklying green TB 3 sec x 10 R/L (issued blue TB for home) Hooklying ball squeeze 5 sec x 10  A/P pelvic tilts x10 Supine bridges + ball squeeze 3 sec 10  Supine bridges + hip abduction blue TB 3 sec x 10  Hooklying glute set --> added hip add iso ball squeeze 3 sec x 10  Single knee to chest  Bilat shoulder flexion holding 8# DB btn hands 90 deg flexion to overhead x 10 - VC to engage core  Seated:  Sit to stand VC to engage core x 10   Standing  Partial squat 3 sec pause x 8  Row green 3 sec x 10  Shoulder extension 3 sec x 10  PATIENT EDUCATION:  Education details: Updated HEP  Person educated: Patient Education method: Explanation, Demonstration, Tactile cues, Verbal cues, and  Handouts Education comprehension: verbalized understanding, returned demonstration, verbal cues required, tactile cues required, and needs further education  HOME EXERCISE PROGRAM: Access Code: [email or web address] URL: [email or web address] Date: 09/24/2022 Prepared by: Corlis Leak  Exercises - Supine Posterior Pelvic Tilt  - 1 x daily - 7 x weekly - 3 sets - 10 reps - Supine Single Knee to Chest Stretch  - 1 x daily - 7 x weekly - 1 sets - 3-5 reps - 10-30 sec hold - Supine Hip Adduction Isometric with Ball  - 1 x daily - 7 x weekly - 1 sets - 5 reps - Supine Bridge with Mini Swiss Ball Between Knees  - 1 x daily - 7 x weekly - 3 sets - 10 reps - 3 sec hold - Supine Hamstring Stretch with Strap  - 1 x daily - 7 x weekly - 1 sets - 3-5 reps - 30 sec hold - Supine ITB Stretch with Strap  - 1 x daily - 7 x weekly - 1 sets - 3-5 reps - 30 sec hold - Seated Quadratus Lumborum Stretch with Forward Bend  - 1 x daily - 7 x weekly - 2 sets - 10 reps - 5-10 sec hold - Seated Flexion Stretch with Swiss Ball  - 1 x daily - 7 x weekly - 2 sets - 10 reps - 5-10 sec hold - Seated Single Leg Hip Abduction with Resistance  - 1 x daily - 7 x weekly - 3 sets - 10 reps - Side Stepping with Resistance at Ankles and Counter Support  - 1 x daily - 7 x weekly - 3 sets - 10 reps - Squat with Chair Touch  - 1 x daily - 7 x weekly - 3 sets - 10 reps - Supine Piriformis Stretch with Leg Straight  - 1 x daily - 7 x weekly - 1 sets - 3 reps - 30 sec  hold - Hooklying Isometric Clamshell  - 1 x daily - 7 x weekly - 1 sets - 10 reps - 3 sec  hold - Standing Bilateral Low Shoulder Row  with Anchored Resistance  - 2 x daily - 7 x weekly - 1-3 sets - 10 reps - 2-3 sec  hold - Shoulder extension with resistance - Neutral  - 1 x daily - 7 x weekly - 1-2 sets - 10 reps - 3-5 sec  hold - Hooklying Shoulder I  - 1 x daily - 7 x weekly - 1 sets - 10 reps - 3 sec  hold - Shoulder Flexion Wall Slide with Towel  - 1 x daily - 7 x  weekly - 1-2 sets - 10 reps - 3 sec  hold - Wall Push Up  - 1 x daily - 7 x weekly - 1-3 sets - 10 reps - 3 sec  hold  ASSESSMENT:  CLINICAL IMPRESSION: Continued with stretching, strengthening, core stabilization. Performing exercises without lumbar support without difficulty. Added exercises without difficulty. Encouraged continued consistent HEP and gradually weaning from lumbar support. Progressing well toward stated goals of therapy.   GOALS: Goals reviewed with patient? Yes  SHORT TERM GOALS: Target date: 10/14/2022  Independent in initial HEP Baseline: Goal status: INITIAL  2.  Patient reports/demonstrates ability to stand for 5-10 min for light household and grooming tasks  Baseline:  Goal status: INITIAL  3.  Independent in gait with least restrictive assistive device for 500 feet without back spasm  Baseline:  Goal status: INITIAL  LONG TERM GOALS: Target date: 11/25/2022  Increase core strength and stability with patient to demonstrate ability to participate in 20 min of exercise without increase in symptoms  Baseline:  Goal status: INITIAL  2.  4+/5 to 5/5 strength bilat LE's  Baseline:  Goal status: INITIAL  3.  Decrease sit to stand times test by 3-5 seconds  Baseline:  Goal status: INITIAL  4.  Patient demonstrates/reports ability to ambulate community distances for functional tasks with least restrictive assistive device (at least 1000 feet)  Baseline:  Goal status: INITIAL  5.  Patient reports ability to do laundry at home  Baseline:  Goal status: INITIAL  6.  Independent in HEP including aquatic program as indicated  Baseline:  Goal status: INITIAL  7.  Improve functional limitation score to 42  Baseline: 25  Goal status: INITIAL   PLAN:  PT FREQUENCY: 2x/week  PT DURATION: 12 weeks  PLANNED INTERVENTIONS: Therapeutic exercises, Therapeutic activity, Neuromuscular re-education, Balance training, Gait training, Patient/Family education, Self  Care, Joint mobilization, Aquatic Therapy, Dry Needling, Electrical stimulation, Spinal mobilization, Cryotherapy, Moist heat, Taping, Ultrasound, Ionotophoresis 4mg /ml Dexamethasone, Manual therapy, and Re-evaluation.  PLAN FOR NEXT SESSION: No side lying or quadruped (too painful). Progress exercises as toerated; continue spine care and body mechanics education; manual work, DN, modalities as indicated   [employer], PT 09/24/2022, 2:01 PM

## 2022-09-26 ENCOUNTER — Ambulatory Visit (INDEPENDENT_AMBULATORY_CARE_PROVIDER_SITE_OTHER): Payer: Medicare Other | Admitting: Family Medicine

## 2022-09-26 ENCOUNTER — Encounter: Payer: Self-pay | Admitting: Family Medicine

## 2022-09-26 VITALS — BP 118/64 | HR 97 | Temp 98.7°F | Ht 70.0 in | Wt 295.0 lb

## 2022-09-26 DIAGNOSIS — G8929 Other chronic pain: Secondary | ICD-10-CM | POA: Diagnosis not present

## 2022-09-26 DIAGNOSIS — M25562 Pain in left knee: Secondary | ICD-10-CM | POA: Diagnosis not present

## 2022-09-26 DIAGNOSIS — M5416 Radiculopathy, lumbar region: Secondary | ICD-10-CM | POA: Insufficient documentation

## 2022-09-26 HISTORY — DX: Radiculopathy, lumbar region: M54.16

## 2022-09-26 MED ORDER — MELOXICAM 15 MG PO TABS
15.0000 mg | ORAL_TABLET | Freq: Every day | ORAL | 3 refills | Status: DC
Start: 2022-09-26 — End: 2023-09-28

## 2022-09-26 NOTE — Progress Notes (Signed)
Subjective:    [name], female    DOB: 10/20/1955, 67 y.o.   MRN: [id number]  Knee Pain    Patient has osteoarthritis in the left knee.  She is complaining of pain in her left knee with ambulation.  She is requesting a cortisone injection in the left knee.  She is not currently taking any NSAID. Past Medical History:  Diagnosis Date   Allergy    Anxiety    Phreesia 06/04/2020   Arthritis    Phreesia 07/08/2020   Chronic pain    back and knee   Colitis 09/2012   infectious vs inflammatory.    Colon stricture [facility])    Depression    Depression    Phreesia 06/04/2020   Depression    Phreesia 07/08/2020   GERD (gastroesophageal reflux disease)    Phreesia 06/04/2020   Hyperlipidemia    Obesity    Osteoarthritis    Osteopenia    Recurrent cold sores    Seasonal allergies    Syncope and collapse 11/30/2012   Normal EEG.  Vaso vagal syncope   Past Surgical History:  Procedure Laterality Date   BIOPSY  02/21/2020   Procedure: BIOPSY;  Surgeon: Iva Boop, MD;  Location: [facility] ENDOSCOPY;  Service: Endoscopy;;   COLON RESECTION N/A 10/14/2013   Procedure: LAPAROSCOPIC MOBILIZATION OF SPLENIC FLEXURE, LAPAROSCOPIC EXTENDED LEFT COLECTOMY;  Surgeon: Atilano Ina, MD;  Location: [facility] OR;  Service: General;  Laterality: N/A;   COLON SURGERY N/A    Phreesia 06/04/2020   COLONOSCOPY N/A 10/12/2013   Procedure: COLONOSCOPY;  Surgeon: Beverley Fiedler, MD;  Location: [facility] ENDOSCOPY;  Service: Endoscopy;  Laterality: N/A;   COLONOSCOPY WITH PROPOFOL N/A 02/21/2020   Procedure: COLONOSCOPY WITH PROPOFOL;  Surgeon: Iva Boop, MD;  Location: [facility] ENDOSCOPY;  Service: Endoscopy;  Laterality: N/A;   DILATION AND CURETTAGE, DIAGNOSTIC / THERAPEUTIC  1987   OPEN REDUCTION INTERNAL FIXATION (ORIF) DISTAL RADIAL FRACTURE Right 08/08/2014   Procedure: OPEN REDUCTION INTERNAL FIXATION (ORIF) DISTAL RADIAL FRACTURE;  Surgeon: Dominica Severin, MD;  Location: [facility] OR;  Service:  Orthopedics;  Laterality: Right;  DVR Crosslock, MD available sometime around 1430-1500   ORIF ANKLE FRACTURE Right 11/10/2019   Procedure: POSSIBLEOPEN REDUCTION INTERNAL FIXATION (ORIF) ANKLE FRACTURE;  Surgeon: Roby Lofts, MD;  Location: [facility] OR;  Service: Orthopedics;  Laterality: Right;   ORIF WRIST FRACTURE Left 11/10/2019   Procedure: OPEN REDUCTION INTERNAL FIXATION (ORIF) WRIST FRACTURE;  Surgeon: Roby Lofts, MD;  Location: [facility] OR;  Service: Orthopedics;  Laterality: Left;   TONSILLECTOMY     TOTAL KNEE ARTHROPLASTY Right 08/05/2016   Procedure: RIGHT TOTAL KNEE ARTHROPLASTY;  Surgeon: Kathryne Hitch, MD;  Location: [facility] OR;  Service: Orthopedics;  Laterality: Right;   Current Outpatient Medications on File Prior to Visit  Medication Sig Dispense Refill   acetaminophen (TYLENOL) 650 MG CR tablet Take 1,300 mg by mouth at bedtime.     atorvastatin (LIPITOR) 40 MG tablet TAKE 1 TABLET EVERY DAY 90 tablet 3   b complex vitamins capsule Take 1 capsule by mouth daily.     Calcium Carb-Cholecalciferol (CALCIUM 600+D3 PO) Take 1 tablet by mouth in the morning and at bedtime.     Cholecalciferol (VITAMIN D-3) 25 MCG (1000 UT) CAPS Take 1,000 Units by mouth daily with breakfast.     cyclobenzaprine (FLEXERIL) 10 MG tablet Take 1 tablet (10 mg total) by mouth 3 (three) times daily as needed for  muscle spasms. 30 tablet 3   docusate sodium (COLACE) 100 MG capsule Take 1 capsule (100 mg total) by mouth 2 (two) times daily. 60 capsule 0   fluticasone (FLONASE) 50 MCG/ACT nasal spray Place 2 sprays into both nostrils at bedtime.     linaclotide (LINZESS) 290 MCG CAPS capsule Take 1 capsule (290 mcg total) by mouth daily 30 minutes before breakfast 30 capsule 3   loratadine (CLARITIN) 10 MG tablet Take 10 mg by mouth at bedtime.     Melatonin 10 MG TABS Take 10 mg by mouth at bedtime.     Multiple Vitamins-Minerals (ONE-A-DAY WOMENS PO) Take 1 tablet by mouth daily with breakfast.      Omega-3 Fatty Acids (FISH OIL) 1000 MG CAPS Take 1,000 mg by mouth 2 (two) times daily.     pantoprazole (PROTONIX) 40 MG tablet TAKE 1 TABLET EVERY DAY 90 tablet 3   polyethylene glycol (MIRALAX / GLYCOLAX) 17 g packet Take 17 g by mouth daily.     predniSONE (DELTASONE) 20 MG tablet 3 tabs poqday 1-2, 2 tabs poqday 3-4, 1 tab poqday 5-6 12 tablet 0   venlafaxine XR (EFFEXOR-XR) 75 MG 24 hr capsule TAKE 2 CAPSULES EVERY DAY WITH BREAKFAST 180 capsule 3   Wheat Dextrin (BENEFIBER) POWD Take 1 Dose by mouth daily. 2 teaspoons     No current facility-administered medications on file prior to visit.   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    Employer: [employer]    Comment: LPN  Tobacco Use   Smoking status: Former    Current packs/day: 0.00    Types: Cigarettes    Quit date: 10/27/1993    Years since quitting: 28.9   Smokeless tobacco: Never  Vaping Use   Vaping status: Never Used  Substance and Sexual Activity   Alcohol use: Yes    Comment: one drink per week   Drug use: No   Sexual activity: Not Currently  Other Topics Concern   Not on file  Social History Narrative   Not on file   Social Determinants of Health   Financial Resource Strain: Low Risk  (09/22/2022)   Overall Financial Resource Strain (CARDIA)    Difficulty of Paying Living Expenses: Not very hard  Food Insecurity: No Food Insecurity (09/22/2022)   Hunger Vital Sign    Worried About Running Out of Food in the Last Year: Never true    Ran Out of Food in the Last Year: Never true  Transportation Needs: No  Transportation Needs (09/22/2022)   PRAPARE - Administrator, Civil Service (Medical): No    Lack of Transportation (Non-Medical): No  Physical Activity: Sufficiently Active (09/22/2022)   Exercise Vital Sign    Days of Exercise per Week: 7 days    Minutes of Exercise per Session: 40 min  Recent Concern: Physical Activity - Inactive (07/10/2022)   Exercise Vital Sign    Days of Exercise per Week: 0 days    Minutes of Exercise per Session: 0 min  Stress: No Stress Concern Present (09/22/2022)  [employer] of Occupational Health - Occupational Stress Questionnaire    Feeling of Stress : Only a little  Recent Concern: Stress - Stress Concern Present (07/10/2022)   [employer] of Occupational Health - Occupational Stress Questionnaire    Feeling of Stress : Rather much  Social Connections: Unknown (09/22/2022)   Social Connection and Isolation Panel [NHANES]    Frequency of Communication with Friends and Family: More than three times a week    Frequency of Social Gatherings with Friends and Family: Patient declined    Attends Religious Services: Patient declined    Database administrator or Organizations: No    Attends Banker Meetings: Never    Marital Status: Divorced  Catering manager Violence: Not At Risk (07/10/2022)   Humiliation, Afraid, Rape, and Kick questionnaire    Fear of Current or Ex-Partner: No    Emotionally Abused: No    Physically Abused: No    Sexually Abused: No   Family History  Problem Relation Age of Onset   Stroke Mother    Arthritis Mother    Thyroid disease Mother    Cancer Father    Arthritis Sister    Thyroid disease Sister    Cancer Maternal Grandmother    Stroke Maternal Grandfather    Cancer Maternal Grandfather    Colon cancer Maternal Grandfather    Cancer Paternal Grandmother    Heart disease Paternal Grandmother    Colon cancer Paternal Grandmother    Stroke Paternal Grandfather    Arthritis Sister    Obesity  Sister    Sudden death Neg Hx    Hypertension Neg Hx    Hyperlipidemia Neg Hx    Heart attack Neg Hx    Diabetes Neg Hx    Rectal cancer Neg Hx    Stomach cancer Neg Hx    Esophageal cancer Neg Hx       Review of Systems  All other systems reviewed and are negative.      Objective:   Physical Exam Vitals reviewed.  Constitutional:      General: She is not in acute distress.    Appearance: She is obese. She is not ill-appearing, toxic-appearing or diaphoretic.  HENT:     Head: Normocephalic and atraumatic.  Cardiovascular:     Rate and Rhythm: Normal rate and regular rhythm.     Pulses: Normal pulses.     Heart sounds: Normal heart sounds. No murmur heard.    No friction rub. No gallop.  Pulmonary:     Effort: Pulmonary effort is normal. No respiratory distress.     Breath sounds: Normal breath sounds. No stridor. No wheezing, rhonchi or rales.  Chest:     Chest wall: No tenderness.  Musculoskeletal:     Cervical back: No muscular tenderness.     Left knee: Decreased range of motion. Tenderness present. Normal meniscus.  Skin:    Findings: No lesion or rash.  Neurological:     General: No focal deficit present.     Mental Status: She is alert and oriented to person, place, and time. Mental status is at baseline.     Cranial Nerves: No cranial nerve deficit.     Sensory: No sensory deficit.     Motor: No weakness.     Coordination: Coordination normal.     Gait: Gait normal.     Deep Tendon Reflexes: Reflexes normal.           Assessment & Plan:  Chronic  pain of left knee I believe the patient is dealing with knee pain due to osteoarthritis.  Using sterile technique, I injected the left knee with 2 cc of lidocaine, 2 cc of Marcaine, and 2 cc of 40 mg/mL Kenalog.  The patient tolerated the procedure well without complication.  We will also start the patient on meloxicam 15 mg a day and I recommended that she monitor closely for any stomach upset and discontinue  the medicine immediately if she develops stomach upset

## 2022-09-29 ENCOUNTER — Ambulatory Visit: Payer: Medicare Other

## 2022-09-29 DIAGNOSIS — G8929 Other chronic pain: Secondary | ICD-10-CM

## 2022-09-29 DIAGNOSIS — M62830 Muscle spasm of back: Secondary | ICD-10-CM

## 2022-09-29 DIAGNOSIS — M5441 Lumbago with sciatica, right side: Secondary | ICD-10-CM | POA: Diagnosis not present

## 2022-09-29 DIAGNOSIS — M6281 Muscle weakness (generalized): Secondary | ICD-10-CM

## 2022-09-29 DIAGNOSIS — R262 Difficulty in walking, not elsewhere classified: Secondary | ICD-10-CM

## 2022-09-29 DIAGNOSIS — R252 Cramp and spasm: Secondary | ICD-10-CM

## 2022-09-29 DIAGNOSIS — R29898 Other symptoms and signs involving the musculoskeletal system: Secondary | ICD-10-CM

## 2022-09-29 NOTE — Therapy (Signed)
OUTPATIENT PHYSICAL THERAPY THORACOLUMBAR TREATMENT   Patient Name: Monique Gonzalez MRN: [id number] DOB:[birth year], 67 y.o., female Today's Date: 09/29/2022  END OF SESSION:  PT End of Session - 09/29/22 1317     Visit Number 8    Number of Visits 24    Date for PT Re-Evaluation 11/25/22    Authorization Type medicare & mutual of [place]    Progress Note Due on Visit 10    PT Start Time 1317    PT Stop Time 1359    PT Time Calculation (min) 42 min    Activity Tolerance Patient tolerated treatment well    Behavior During Therapy [facility] for tasks assessed/performed             Past Medical History:  Diagnosis Date   Allergy    Anxiety    Phreesia 06/04/2020   Arthritis    Phreesia 07/08/2020   Chronic pain    back and knee   Colitis 09/2012   infectious vs inflammatory.    Colon stricture [facility])    Depression    Depression    Phreesia 06/04/2020   Depression    Phreesia 07/08/2020   GERD (gastroesophageal reflux disease)    Phreesia 06/04/2020   Hyperlipidemia    Obesity    Osteoarthritis    Osteopenia    Recurrent cold sores    Seasonal allergies    Syncope and collapse 11/30/2012   Normal EEG.  Vaso vagal syncope   Past Surgical History:  Procedure Laterality Date   BIOPSY  02/21/2020   Procedure: BIOPSY;  Surgeon: Iva Boop, MD;  Location: [facility] ENDOSCOPY;  Service: Endoscopy;;   COLON RESECTION N/A 10/14/2013   Procedure: LAPAROSCOPIC MOBILIZATION OF SPLENIC FLEXURE, LAPAROSCOPIC EXTENDED LEFT COLECTOMY;  Surgeon: Atilano Ina, MD;  Location: [facility] OR;  Service: General;  Laterality: N/A;   COLON SURGERY N/A    Phreesia 06/04/2020   COLONOSCOPY N/A 10/12/2013   Procedure: COLONOSCOPY;  Surgeon: Beverley Fiedler, MD;  Location: [facility] ENDOSCOPY;  Service: Endoscopy;  Laterality: N/A;   COLONOSCOPY WITH PROPOFOL N/A 02/21/2020   Procedure: COLONOSCOPY WITH PROPOFOL;  Surgeon: Iva Boop, MD;  Location: [facility] ENDOSCOPY;  Service: Endoscopy;  Laterality: N/A;    DILATION AND CURETTAGE, DIAGNOSTIC / THERAPEUTIC  1987   OPEN REDUCTION INTERNAL FIXATION (ORIF) DISTAL RADIAL FRACTURE Right 08/08/2014   Procedure: OPEN REDUCTION INTERNAL FIXATION (ORIF) DISTAL RADIAL FRACTURE;  Surgeon: Dominica Severin, MD;  Location: [facility] OR;  Service: Orthopedics;  Laterality: Right;  DVR Crosslock, MD available sometime around 1430-1500   ORIF ANKLE FRACTURE Right 11/10/2019   Procedure: POSSIBLEOPEN REDUCTION INTERNAL FIXATION (ORIF) ANKLE FRACTURE;  Surgeon: Roby Lofts, MD;  Location: [facility] OR;  Service: Orthopedics;  Laterality: Right;   ORIF WRIST FRACTURE Left 11/10/2019   Procedure: OPEN REDUCTION INTERNAL FIXATION (ORIF) WRIST FRACTURE;  Surgeon: Roby Lofts, MD;  Location: [facility] OR;  Service: Orthopedics;  Laterality: Left;   TONSILLECTOMY     TOTAL KNEE ARTHROPLASTY Right 08/05/2016   Procedure: RIGHT TOTAL KNEE ARTHROPLASTY;  Surgeon: Kathryne Hitch, MD;  Location: [facility] OR;  Service: Orthopedics;  Laterality: Right;   Patient Active Problem List   Diagnosis Date Noted   Lumbar radiculopathy 09/26/2022   Spondylolisthesis of lumbar region 07/22/2022   Status post lumbar spinal fusion 07/22/2022   Ischemic stricture intestine (HCC)    Colitis 02/19/2020   Closed fracture of left distal radius 11/10/2019   Fall 11/10/2019   Closed right ankle fracture  11/09/2019   Ankle fracture 11/09/2019   Closed displaced fracture of styloid process of left ulna    Anxiety and depression 12/22/2016   Insulin resistance 10/15/2016   Presence of right artificial knee joint 10/14/2016   Vitamin D deficiency 04/08/2016   Constipation 09/20/2013   Chronic idiopathic constipation 07/05/2013   Recurrent cold sores    Osteoarthritis    Depression    Hyperlipidemia     PCP: Dr Terance Ice  REFERRING PROVIDER: Dr Lisbeth Renshaw  REFERRING DIAG: lumbar spinal stenosis  Rationale for Evaluation and Treatment: Rehabilitation  THERAPY DIAG:  Chronic  bilateral low back pain with right-sided sciatica  Other symptoms and signs involving the musculoskeletal system  Muscle spasm of back  Difficulty in walking, not elsewhere classified  Muscle weakness (generalized)  Cramp and spasm  ONSET DATE: 07/22/22  SUBJECTIVE:                                                                                                                                                                                           SUBJECTIVE STATEMENT: Patient reports no pain or soreness in back today, states she has not had a pain pill on a week. Patient states she has been using stationary bike at home when she is unable to walk outside. Patient states she received a cortisone shot in L knee on Friday and it has been feeling better; states she has not had any cramps/spasm in back for a while.   PERTINENT HISTORY:  LBP for 10 years on and off with significant pain in the past 1.5 years; arthritis; R TKA; L knee pain; fx bilat wrists ORIF; bowel resection; idiopathic constipation; depression; obesity  PAIN:  Are you having pain? Yes: NPRS scale: 0/10; this am was 3-4/10  Pain location: lower back; R side of spine  Pain description: aching; burning; muscle  Aggravating factors: walking Relieving factors: sitting; lying in the bed; muscle relaxer   PRECAUTIONS: post op lumbar fusion- to remain in lumbar brace until RTD 10/06/22; out of brace to sleep; no lifting; no bending hands past knees   WEIGHT BEARING RESTRICTIONS: No  FALLS:  Has patient fallen in last 6 months? 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   PATIENT SURVEYS:  FOTO 25; goal 63    SENSATION: WFL  MUSCLE LENGTH: Hamstrings: Right 75 deg; Left 70 deg Thomas test: tight bilat   POSTURE: rounded shoulders, forward head, and flexed trunk   PALPATION: Muscular tightness noted through hip flexors bilat; Rt thoracolumbar paraspinals  (Patient in lumbar brace limiting palpation)   LUMBAR ROM:   AROM eval  Flexion Fingers to knees   Extension Neutral  Right lateral flexion 60% pulling pain L LB  Left lateral flexion 65% cramping L LB  Right rotation NT  Left rotation NT   (Blank rows = not tested)  LOWER EXTREMITY ROM:  hip mobility WFL's except hip extension which is limited   (ROM generally limited by obesity)  Active  Right eval Left eval  Hip flexion    Hip extension    Hip abduction    Hip adduction    Hip internal rotation    Hip external rotation    Knee flexion    Knee extension    Ankle dorsiflexion    Ankle plantarflexion    Ankle inversion    Ankle eversion     (Blank rows = not tested)  LOWER EXTREMITY MMT:  assessed in sitting and supine - patient unable to lie sidelying or prone   MMT Right eval Left eval  Hip flexion 4 4  Hip extension 4- 4-  Hip abduction 4 4  Hip adduction    Hip internal rotation    Hip external rotation    Knee flexion 5 5  Knee extension 5 5  Ankle dorsiflexion    Ankle plantarflexion    Ankle inversion    Ankle eversion     (Blank rows = not tested)  LUMBAR SPECIAL TESTS:  Straight leg raise test: Negative and Slump test: Negative  FUNCTIONAL TESTS:  5 times sit to stand: 18.46 sec using UE's to move sit to stand; reports cramping R side   GAIT: Distance walked: 40 Assistive device utilized: Quad cane small base Level of assistance: Complete  Independence Comments: wide based gait; flexed forward at hips; limp R LE during Wt bearing R    [facility] Adult PT Treatment:                                                DATE: 09/29/2022 Therapeutic Exercise: NuStep L6 x Travell piriformis stretch 2x30" (B) Hooklying bent knee fall out GTB x10 (B) --> hooklying clamshells GTB x15 Supine bridges + ball squeeze x15 --> staggered stance bridge 2x5 (B) SKTC 4x10" Pilates single leg circles x5 CW/CWW each Bilat shoulder flexion holding 8# DB b/n hands 90 deg flexion to overhead x 10 --> added single leg extension form table top position Seated HS stretch (B) STS: hands braced on thighs --> arms crossed at chest --> cradling 8#DB x10 Fwd walking no AD  Bkwd walking by counter Side stepping + cane chest press    [facility] Adult PT Treatment:  DATE: 09/24/2022 Therapeutic Exercise: NuStep L6 x Supine: Piriformis stretch R travell with strap distal thigh 30 sec x 5(foot resting on opposite thigh) Hip abduction in hooklying green TB 3 sec x 10 R/L (issued blue TB for home) Supine bridges + hip abduction green TB 3 sec x 10  Hooklying ball squeeze 5 sec x 10  Supine bridges + ball squeeze 3 sec 10  Hooklying glute set with hip add isometric ball squeeze 3 sec x 10  Single knee to chest 5 sec x5 R/L Bilat shoulder flexion holding 8# DB btn hands 90 deg flexion to overhead x 10 - VC to engage core   Seated:  Hamstring stretch 30 sec x 2 Sit to stand VC to engage core x 10   Standing  B Shoulder flexion facing counter sliding hands up cabinet 3 sec x 10 Wall push up 3 sec x 10  Row green 3 sec x 10  Shoulder extension 3 sec x 10 Side stepping GTB above knees (counter) --> YTB crossed at ankles Shoulder flexion at counter fingers climbing up cabinet core tight x 10    [facility] Adult PT Treatment:                                                DATE: 09/22/2022 Therapeutic Exercise: NuStep L6  x Supine: Piriformis stretch travell 30 sec x 3 (foot resting on opposite thigh) Hip abduction in hooklying green TB 3 sec x 10 R/L (issued blue TB for home) Hooklying ball squeeze 5 sec x 10  A/P pelvic tilts x10 Supine bridges + ball squeeze 3 sec 10  Supine bridges + hip abduction blue TB 3 sec x 10  Hooklying glute set --> added hip add iso ball squeeze 3 sec x 10  Single knee to chest  Bilat shoulder flexion holding 8# DB btn hands 90 deg flexion to overhead x 10 - VC to engage core  Seated:  Sit to stand VC to engage core x 10   Standing  Partial squat 3 sec pause x 8  Row green 3 sec x 10  Shoulder extension 3 sec x 10   PATIENT EDUCATION:  Education details: Updated HEP  Person educated: Patient Education method: Explanation, Demonstration, Tactile cues, Verbal cues, and Handouts Education comprehension: verbalized understanding, returned demonstration, verbal cues required, tactile cues required, and needs further education  HOME EXERCISE PROGRAM: Access Code: [email or web address] URL: [email or web address] Date: 09/24/2022 Prepared by: Corlis Leak  Exercises - Supine Posterior Pelvic Tilt  - 1 x daily - 7 x weekly - 3 sets - 10 reps - Supine Single Knee to Chest Stretch  - 1 x daily - 7 x weekly - 1 sets - 3-5 reps - 10-30 sec hold - Supine Hip Adduction Isometric with Ball  - 1 x daily - 7 x weekly - 1 sets - 5 reps - Supine Bridge with Mini Swiss Ball Between Knees  - 1 x daily - 7 x weekly - 3 sets - 10 reps - 3 sec hold - Supine Hamstring Stretch with Strap  - 1 x daily - 7 x weekly - 1 sets - 3-5 reps - 30 sec hold - Supine ITB Stretch with Strap  - 1 x daily - 7 x weekly - 1 sets - 3-5 reps - 30 sec hold - Seated Quadratus Lumborum  Stretch with Forward Bend  - 1 x daily - 7 x weekly - 2 sets - 10 reps - 5-10 sec hold - Seated Flexion Stretch with Swiss Ball  - 1 x daily - 7 x weekly - 2 sets - 10 reps - 5-10 sec hold - Seated Single Leg Hip Abduction  with Resistance  - 1 x daily - 7 x weekly - 3 sets - 10 reps - Side Stepping with Resistance at Ankles and Counter Support  - 1 x daily - 7 x weekly - 3 sets - 10 reps - Squat with Chair Touch  - 1 x daily - 7 x weekly - 3 sets - 10 reps - Supine Piriformis Stretch with Leg Straight  - 1 x daily - 7 x weekly - 1 sets - 3 reps - 30 sec  hold - Hooklying Isometric Clamshell  - 1 x daily - 7 x weekly - 1 sets - 10 reps - 3 sec  hold - Standing Bilateral Low Shoulder Row with Anchored Resistance  - 2 x daily - 7 x weekly - 1-3 sets - 10 reps - 2-3 sec  hold - Shoulder extension with resistance - Neutral  - 1 x daily - 7 x weekly - 1-2 sets - 10 reps - 3-5 sec  hold - Hooklying Shoulder I  - 1 x daily - 7 x weekly - 1 sets - 10 reps - 3 sec  hold - Shoulder Flexion Wall Slide with Towel  - 1 x daily - 7 x weekly - 1-2 sets - 10 reps - 3 sec  hold - Wall Push Up  - 1 x daily - 7 x weekly - 1-3 sets - 10 reps - 3 sec  hold  ASSESSMENT:  CLINICAL IMPRESSION: Gait training without AD performed to improve patient's confidence when walking independently; Mild trendelenburg noted on R side. Dynamic balance and glute activation challenged with multi-directional walking. Patient tolerated progression with added resistance during sit to stand exercise, demonstrating good body mechanics and posture.   GOALS: Goals reviewed with patient? Yes  SHORT TERM GOALS: Target date: 10/14/2022  Independent in initial HEP Baseline: Goal status: INITIAL  2.  Patient reports/demonstrates ability to stand for 5-10 min for light household and grooming tasks  Baseline:  Goal status: INITIAL  3.  Independent in gait with least restrictive assistive device for 500 feet without back spasm  Baseline:  Goal status: INITIAL   LONG TERM GOALS: Target date: 11/25/2022  Increase core strength and stability with patient to demonstrate ability to participate in 20 min of exercise without increase in symptoms  Baseline:   Goal status: INITIAL  2.  4+/5 to 5/5 strength bilat LE's  Baseline:  Goal status: INITIAL  3.  Decrease sit to stand times test by 3-5 seconds  Baseline:  Goal status: INITIAL  4.  Patient demonstrates/reports ability to ambulate community distances for functional tasks with least restrictive assistive device (at least 1000 feet)  Baseline:  Goal status: INITIAL  5.  Patient reports ability to do laundry at home  Baseline:  Goal status: INITIAL  6.  Independent in HEP including aquatic program as indicated  Baseline:  Goal status: INITIAL  7.  Improve functional limitation score to 42  Baseline: 25  Goal status: INITIAL   PLAN:  PT FREQUENCY: 2x/week  PT DURATION: 12 weeks  PLANNED INTERVENTIONS: Therapeutic exercises, Therapeutic activity, Neuromuscular re-education, Balance training, Gait training, Patient/Family education, Self Care, Joint mobilization, Aquatic Therapy,  Dry Needling, Electrical stimulation, Spinal mobilization, Cryotherapy, Moist heat, Taping, Ultrasound, Ionotophoresis 4mg /ml Dexamethasone, Manual therapy, and Re-evaluation.  PLAN FOR NEXT SESSION: No side lying or quadruped (too painful). Lifting mechanics, progress exercises as toerated; continue spine care and body mechanics education; manual work, DN, modalities as indicated   Sanjuana Mae, PTA 09/29/2022, 2:00 PM

## 2022-10-02 ENCOUNTER — Encounter: Payer: Self-pay | Admitting: Physical Therapy

## 2022-10-02 ENCOUNTER — Ambulatory Visit: Payer: Medicare Other | Admitting: Physical Therapy

## 2022-10-02 DIAGNOSIS — G8929 Other chronic pain: Secondary | ICD-10-CM

## 2022-10-02 DIAGNOSIS — M62830 Muscle spasm of back: Secondary | ICD-10-CM

## 2022-10-02 DIAGNOSIS — M6281 Muscle weakness (generalized): Secondary | ICD-10-CM

## 2022-10-02 DIAGNOSIS — R262 Difficulty in walking, not elsewhere classified: Secondary | ICD-10-CM

## 2022-10-02 DIAGNOSIS — R29898 Other symptoms and signs involving the musculoskeletal system: Secondary | ICD-10-CM

## 2022-10-02 DIAGNOSIS — M5441 Lumbago with sciatica, right side: Secondary | ICD-10-CM | POA: Diagnosis not present

## 2022-10-02 DIAGNOSIS — R252 Cramp and spasm: Secondary | ICD-10-CM

## 2022-10-02 NOTE — Therapy (Signed)
OUTPATIENT PHYSICAL THERAPY THORACOLUMBAR TREATMENT   Patient Name: Monique Gonzalez MRN: [id number] DOB:[birth year], 67 y.o., female Today's Date: 10/02/2022  END OF SESSION:  PT End of Session - 10/02/22 1323     Visit Number 9    Number of Visits 24    Date for PT Re-Evaluation 11/25/22    Authorization Type medicare & mutual of [place]    Progress Note Due on Visit 10    PT Start Time 1317    PT Stop Time 1356    PT Time Calculation (min) 39 min    Activity Tolerance Patient tolerated treatment well    Behavior During Therapy [facility] for tasks assessed/performed              Past Medical History:  Diagnosis Date   Allergy    Anxiety    Phreesia 06/04/2020   Arthritis    Phreesia 07/08/2020   Chronic pain    back and knee   Colitis 09/2012   infectious vs inflammatory.    Colon stricture [facility])    Depression    Depression    Phreesia 06/04/2020   Depression    Phreesia 07/08/2020   GERD (gastroesophageal reflux disease)    Phreesia 06/04/2020   Hyperlipidemia    Obesity    Osteoarthritis    Osteopenia    Recurrent cold sores    Seasonal allergies    Syncope and collapse 11/30/2012   Normal EEG.  Vaso vagal syncope   Past Surgical History:  Procedure Laterality Date   BIOPSY  02/21/2020   Procedure: BIOPSY;  Surgeon: Iva Boop, MD;  Location: [facility] ENDOSCOPY;  Service: Endoscopy;;   COLON RESECTION N/A 10/14/2013   Procedure: LAPAROSCOPIC MOBILIZATION OF SPLENIC FLEXURE, LAPAROSCOPIC EXTENDED LEFT COLECTOMY;  Surgeon: Atilano Ina, MD;  Location: [facility] OR;  Service: General;  Laterality: N/A;   COLON SURGERY N/A    Phreesia 06/04/2020   COLONOSCOPY N/A 10/12/2013   Procedure: COLONOSCOPY;  Surgeon: Beverley Fiedler, MD;  Location: [facility] ENDOSCOPY;  Service: Endoscopy;  Laterality: N/A;   COLONOSCOPY WITH PROPOFOL N/A 02/21/2020   Procedure: COLONOSCOPY WITH PROPOFOL;  Surgeon: Iva Boop, MD;  Location: [facility] ENDOSCOPY;  Service: Endoscopy;  Laterality: N/A;    DILATION AND CURETTAGE, DIAGNOSTIC / THERAPEUTIC  1987   OPEN REDUCTION INTERNAL FIXATION (ORIF) DISTAL RADIAL FRACTURE Right 08/08/2014   Procedure: OPEN REDUCTION INTERNAL FIXATION (ORIF) DISTAL RADIAL FRACTURE;  Surgeon: Dominica Severin, MD;  Location: [facility] OR;  Service: Orthopedics;  Laterality: Right;  DVR Crosslock, MD available sometime around 1430-1500   ORIF ANKLE FRACTURE Right 11/10/2019   Procedure: POSSIBLEOPEN REDUCTION INTERNAL FIXATION (ORIF) ANKLE FRACTURE;  Surgeon: Roby Lofts, MD;  Location: [facility] OR;  Service: Orthopedics;  Laterality: Right;   ORIF WRIST FRACTURE Left 11/10/2019   Procedure: OPEN REDUCTION INTERNAL FIXATION (ORIF) WRIST FRACTURE;  Surgeon: Roby Lofts, MD;  Location: [facility] OR;  Service: Orthopedics;  Laterality: Left;   TONSILLECTOMY     TOTAL KNEE ARTHROPLASTY Right 08/05/2016   Procedure: RIGHT TOTAL KNEE ARTHROPLASTY;  Surgeon: Kathryne Hitch, MD;  Location: [facility] OR;  Service: Orthopedics;  Laterality: Right;   Patient Active Problem List   Diagnosis Date Noted   Lumbar radiculopathy 09/26/2022   Spondylolisthesis of lumbar region 07/22/2022   Status post lumbar spinal fusion 07/22/2022   Ischemic stricture intestine (HCC)    Colitis 02/19/2020   Closed fracture of left distal radius 11/10/2019   Fall 11/10/2019   Closed right ankle  fracture 11/09/2019   Ankle fracture 11/09/2019   Closed displaced fracture of styloid process of left ulna    Anxiety and depression 12/22/2016   Insulin resistance 10/15/2016   Presence of right artificial knee joint 10/14/2016   Vitamin D deficiency 04/08/2016   Constipation 09/20/2013   Chronic idiopathic constipation 07/05/2013   Recurrent cold sores    Osteoarthritis    Depression    Hyperlipidemia     PCP: Dr Terance Ice  REFERRING PROVIDER: Dr Lisbeth Renshaw  REFERRING DIAG: lumbar spinal stenosis  Rationale for Evaluation and Treatment: Rehabilitation  THERAPY DIAG:  Chronic  bilateral low back pain with right-sided sciatica  Other symptoms and signs involving the musculoskeletal system  Muscle spasm of back  Difficulty in walking, not elsewhere classified  Muscle weakness (generalized)  Cramp and spasm  ONSET DATE: 07/22/22  SUBJECTIVE:                                                                                                                                                                                           SUBJECTIVE STATEMENT:    PERTINENT HISTORY:  LBP for 10 years on and off with significant pain in the past 1.5 years; arthritis; R TKA; L knee pain; fx bilat wrists ORIF; bowel resection; idiopathic constipation; depression; obesity  PAIN:  Are you having pain? Yes: NPRS scale: 0/10; this am was 3-4/10  Pain location: lower back; R side of spine  Pain description: aching; burning; muscle  Aggravating factors: walking Relieving factors: sitting; lying in the bed; muscle relaxer   PRECAUTIONS: post op lumbar fusion- to remain in lumbar brace until RTD 10/06/22; out of brace to sleep; no lifting; no bending hands past knees   WEIGHT BEARING RESTRICTIONS: No  FALLS:  Has patient fallen in last 6 months? 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   PATIENT SURVEYS:  FOTO 25; goal 72    SENSATION: WFL  MUSCLE LENGTH: Hamstrings: Right 75 deg; Left 70 deg Thomas test: tight bilat    POSTURE: rounded shoulders, forward head, and flexed trunk   PALPATION:  Muscular tightness noted through hip flexors bilat; Rt thoracolumbar paraspinals  (Patient in lumbar brace limiting palpation)   LUMBAR ROM:   AROM eval  Flexion Fingers to knees   Extension Neutral  Right lateral flexion 60% pulling pain L LB  Left lateral flexion 65% cramping L LB  Right rotation NT  Left rotation NT   (Blank rows = not tested)  LOWER EXTREMITY ROM:  hip mobility WFL's except hip extension which is limited   (ROM generally limited by obesity)  Active  Right eval Left eval  Hip flexion    Hip extension    Hip abduction    Hip adduction    Hip internal rotation    Hip external rotation    Knee flexion    Knee extension    Ankle dorsiflexion    Ankle plantarflexion    Ankle inversion    Ankle eversion     (Blank rows = not tested)  LOWER EXTREMITY MMT:  assessed in sitting and supine - patient unable to lie sidelying or prone   MMT Right eval Left eval  Hip flexion 4 4  Hip extension 4- 4-  Hip abduction 4 4  Hip adduction    Hip internal rotation    Hip external rotation    Knee flexion 5 5  Knee extension 5 5  Ankle dorsiflexion    Ankle plantarflexion    Ankle inversion    Ankle eversion     (Blank rows = not tested)  LUMBAR SPECIAL TESTS:  Straight leg raise test: Negative and Slump test: Negative  FUNCTIONAL TESTS:  5 times sit to stand: 18.46 sec using UE's to move sit to stand; reports cramping R side   GAIT: Distance walked: 40 Assistive device utilized: Quad cane small base Level of assistance: Complete Independence Comments: wide based gait; flexed forward at hips; limp R LE during Wt bearing R    [facility] Adult PT Treatment:                                                DATE:   10/02/22  TherEx  Nustep L6x6 minutes BUEs/BLEs  Supine TA sets x15 with 3 second holds TA set + march x12 TA set + limited ROM SLR x10 B focus on core stability and  controlled lower of LE PPT + limited range double bent knee leg raise/lower x6 Standing hip hikes x10 B Standing hip hikes + ABD x10 B   NMR  Tandem stance 3x30 seconds B solid surface  SLS with one foot on 4 inch step 3x30 seconds B     09/29/2022 Therapeutic Exercise: NuStep L6 x Travell piriformis stretch 2x30" (B) Hooklying bent knee fall out GTB x10 (B) --> hooklying clamshells GTB x15 Supine bridges + ball squeeze x15 --> staggered stance bridge 2x5 (B) SKTC 4x10" Pilates single leg circles x5 CW/CWW each Bilat shoulder flexion holding 8# DB b/n hands 90 deg flexion to overhead x 10 --> added single leg extension form table top position Seated HS stretch (B) STS: hands braced on thighs --> arms crossed at chest --> cradling 8#DB x10 Fwd walking no AD  Bkwd walking by counter Side stepping + cane chest press    [facility] Adult PT Treatment:  DATE: 09/24/2022 Therapeutic Exercise: NuStep L6 x Supine: Piriformis stretch R travell with strap distal thigh 30 sec x 5(foot resting on opposite thigh) Hip abduction in hooklying green TB 3 sec x 10 R/L (issued blue TB for home) Supine bridges + hip abduction green TB 3 sec x 10  Hooklying ball squeeze 5 sec x 10  Supine bridges + ball squeeze 3 sec 10  Hooklying glute set with hip add isometric ball squeeze 3 sec x 10  Single knee to chest 5 sec x5 R/L Bilat shoulder flexion holding 8# DB btn hands 90 deg flexion to overhead x 10 - VC to engage core   Seated:  Hamstring stretch 30 sec x 2 Sit to stand VC to engage core x 10   Standing  B Shoulder flexion facing counter sliding hands up cabinet 3 sec x 10 Wall push up 3 sec x 10  Row green 3 sec x 10  Shoulder extension 3 sec x 10 Side stepping GTB above knees (counter) --> YTB crossed at ankles Shoulder flexion at counter fingers climbing up cabinet core tight x 10    [facility] Adult PT Treatment:                                                 DATE: 09/22/2022 Therapeutic Exercise: NuStep L6 x Supine: Piriformis stretch travell 30 sec x 3 (foot resting on opposite thigh) Hip abduction in hooklying green TB 3 sec x 10 R/L (issued blue TB for home) Hooklying ball squeeze 5 sec x 10  A/P pelvic tilts x10 Supine bridges + ball squeeze 3 sec 10  Supine bridges + hip abduction blue TB 3 sec x 10  Hooklying glute set --> added hip add iso ball squeeze 3 sec x 10  Single knee to chest  Bilat shoulder flexion holding 8# DB btn hands 90 deg flexion to overhead x 10 - VC to engage core  Seated:  Sit to stand VC to engage core x 10   Standing  Partial squat 3 sec pause x 8  Row green 3 sec x 10  Shoulder extension 3 sec x 10   PATIENT EDUCATION:  Education details: Updated HEP  Person educated: Patient Education method: Explanation, Demonstration, Tactile cues, Verbal cues, and Handouts Education comprehension: verbalized understanding, returned demonstration, verbal cues required, tactile cues required, and needs further education  HOME EXERCISE PROGRAM:  Access Code: [email or web address] URL: [email or web address] Date: 10/02/2022 Prepared by: Nedra Hai  Exercises - Supine Posterior Pelvic Tilt  - 1 x daily - 7 x weekly - 3 sets - 10 reps - Supine Single Knee to Chest Stretch  - 1 x daily - 7 x weekly - 1 sets - 3-5 reps - 10-30 sec hold - Supine Hip Adduction Isometric with Ball  - 1 x daily - 7 x weekly - 1 sets - 5 reps - Supine Bridge with Mini Swiss Ball Between Knees  - 1 x daily - 7 x weekly - 3 sets - 10 reps - 3 sec hold - Supine Hamstring Stretch with Strap  - 1 x daily - 7 x weekly - 1 sets - 3-5 reps - 30 sec hold - Supine ITB Stretch with Strap  - 1 x daily - 7 x weekly - 1 sets - 3-5 reps - 30 sec hold - Seated Quadratus  Lumborum Stretch with Forward Bend  - 1 x daily - 7 x weekly - 2 sets - 10 reps - 5-10 sec hold - Seated Flexion Stretch with Swiss Ball  - 1 x daily - 7 x  weekly - 2 sets - 10 reps - 5-10 sec hold - Seated Single Leg Hip Abduction with Resistance  - 1 x daily - 7 x weekly - 3 sets - 10 reps - Side Stepping with Resistance at Ankles and Counter Support  - 1 x daily - 7 x weekly - 3 sets - 10 reps - Squat with Chair Touch  - 1 x daily - 7 x weekly - 3 sets - 10 reps - Supine Piriformis Stretch with Leg Straight  - 1 x daily - 7 x weekly - 1 sets - 3 reps - 30 sec  hold - Hooklying Isometric Clamshell  - 1 x daily - 7 x weekly - 1 sets - 10 reps - 3 sec  hold - Standing Bilateral Low Shoulder Row with Anchored Resistance  - 2 x daily - 7 x weekly - 1-3 sets - 10 reps - 2-3 sec  hold - Shoulder extension with resistance - Neutral  - 1 x daily - 7 x weekly - 1-2 sets - 10 reps - 3-5 sec  hold - Hooklying Shoulder I  - 1 x daily - 7 x weekly - 1 sets - 10 reps - 3 sec  hold - Shoulder Flexion Wall Slide with Towel  - 1 x daily - 7 x weekly - 1-2 sets - 10 reps - 3 sec  hold - Wall Push Up  - 1 x daily - 7 x weekly - 1-3 sets - 10 reps - 3 sec  hold - Standing Transverse Abdominis Contraction  - 1 x daily - 7 x weekly - 3 sets - 10 reps - Supine March with Posterior Pelvic Tilt  - 1 x daily - 7 x weekly - 3 sets - 10 reps - Standing Hip Hiking  - 1 x daily - 7 x weekly - 3 sets - 10 reps  ASSESSMENT:  CLINICAL IMPRESSION:  Continued focus on functional strengthening as tolerated, continued work on core strength as well per her request. Doing well, we will continue to progress as able and tolerated. Spent some time working on balance as well, sounds like she gets a bit unsteady especially with longer distance ambulation. Will get more formal measures for progress note next visit.    GOALS: Goals reviewed with patient? Yes  SHORT TERM GOALS: Target date: 10/14/2022  Independent in initial HEP Baseline: Goal status: INITIAL  2.  Patient reports/demonstrates ability to stand for 5-10 min for light household and grooming tasks  Baseline:  Goal  status: INITIAL  3.  Independent in gait with least restrictive assistive device for 500 feet without back spasm  Baseline:  Goal status: INITIAL   LONG TERM GOALS: Target date: 11/25/2022  Increase core strength and stability with patient to demonstrate ability to participate in 20 min of exercise without increase in symptoms  Baseline:  Goal status: INITIAL  2.  4+/5 to 5/5 strength bilat LE's  Baseline:  Goal status: INITIAL  3.  Decrease sit to stand times test by 3-5 seconds  Baseline:  Goal status: INITIAL  4.  Patient demonstrates/reports ability to ambulate community distances for functional tasks with least restrictive assistive device (at least 1000 feet)  Baseline:  Goal status: INITIAL  5.  Patient reports  ability to do laundry at home  Baseline:  Goal status: INITIAL  6.  Independent in HEP including aquatic program as indicated  Baseline:  Goal status: INITIAL  7.  Improve functional limitation score to 42  Baseline: 25  Goal status: INITIAL   PLAN:  PT FREQUENCY: 2x/week  PT DURATION: 12 weeks  PLANNED INTERVENTIONS: Therapeutic exercises, Therapeutic activity, Neuromuscular re-education, Balance training, Gait training, Patient/Family education, Self Care, Joint mobilization, Aquatic Therapy, Dry Needling, Electrical stimulation, Spinal mobilization, Cryotherapy, Moist heat, Taping, Ultrasound, Ionotophoresis 4mg /ml Dexamethasone, Manual therapy, and Re-evaluation.  PLAN FOR NEXT SESSION: No side lying or quadruped (too painful). Lifting mechanics, progress exercises as toerated; continue spine care and body mechanics education; manual work, DN, modalities as indicated. Balance as time allows MCR progress note next visit   Nedra Hai, PT, DPT 10/02/22 1:57 PM

## 2022-10-03 ENCOUNTER — Other Ambulatory Visit (INDEPENDENT_AMBULATORY_CARE_PROVIDER_SITE_OTHER): Payer: Medicare Other

## 2022-10-03 DIAGNOSIS — G8929 Other chronic pain: Secondary | ICD-10-CM

## 2022-10-03 MED ORDER — TRIAMCINOLONE ACETONIDE 40 MG/ML IJ SUSP
40.0000 mg | Freq: Once | INTRAMUSCULAR | Status: AC
Start: 2022-10-03 — End: 2022-09-26
  Administered 2022-09-26: 40 mg via INTRA_ARTICULAR

## 2022-10-03 MED ORDER — BUPIVACAINE HCL 0.25 % IJ SOLN
2.0000 mL | Freq: Once | INTRAMUSCULAR | Status: AC
Start: 2022-10-03 — End: 2022-09-26
  Administered 2022-09-26: 2 mL via INTRA_ARTICULAR

## 2022-10-03 MED ORDER — LIDOCAINE HCL (PF) 1 % IJ SOLN
2.0000 mL | Freq: Once | INTRAMUSCULAR | Status: AC
Start: 2022-10-03 — End: 2022-09-26
  Administered 2022-09-26: 2 mL

## 2022-10-08 ENCOUNTER — Ambulatory Visit: Payer: Medicare Other | Admitting: Rehabilitative and Restorative Service Providers"

## 2022-10-08 ENCOUNTER — Encounter: Payer: Self-pay | Admitting: Rehabilitative and Restorative Service Providers"

## 2022-10-08 DIAGNOSIS — R252 Cramp and spasm: Secondary | ICD-10-CM

## 2022-10-08 DIAGNOSIS — M62830 Muscle spasm of back: Secondary | ICD-10-CM

## 2022-10-08 DIAGNOSIS — M6281 Muscle weakness (generalized): Secondary | ICD-10-CM

## 2022-10-08 DIAGNOSIS — R262 Difficulty in walking, not elsewhere classified: Secondary | ICD-10-CM

## 2022-10-08 DIAGNOSIS — R29898 Other symptoms and signs involving the musculoskeletal system: Secondary | ICD-10-CM

## 2022-10-08 DIAGNOSIS — G8929 Other chronic pain: Secondary | ICD-10-CM

## 2022-10-08 DIAGNOSIS — M5441 Lumbago with sciatica, right side: Secondary | ICD-10-CM | POA: Diagnosis not present

## 2022-10-08 NOTE — Therapy (Signed)
OUTPATIENT PHYSICAL THERAPY THORACOLUMBAR TREATMENT MEDICARE 10th VISIT NOTE  Progress Note Reporting Period 09/02/22 to 10/08/22  See note below for Objective Data and Assessment of Progress/Goals.      Patient Name: Monique Gonzalez MRN: [id number] DOB:Oct 02, 1955, 67 y.o., female Today's Date: 10/08/2022  END OF SESSION:  PT End of Session - 10/08/22 1320     Visit Number 10    Number of Visits 24    Date for PT Re-Evaluation 11/25/22    Authorization Type medicare & mutual of [place]    Progress Note Due on Visit 10    PT Start Time 1313    PT Stop Time 1400    PT Time Calculation (min) 47 min    Activity Tolerance Patient tolerated treatment well              Past Medical History:  Diagnosis Date   Allergy    Anxiety    Phreesia 06/04/2020   Arthritis    Phreesia 07/08/2020   Chronic pain    back and knee   Colitis 09/2012   infectious vs inflammatory.    Colon stricture [facility])    Depression    Depression    Phreesia 06/04/2020   Depression    Phreesia 07/08/2020   GERD (gastroesophageal reflux disease)    Phreesia 06/04/2020   Hyperlipidemia    Obesity    Osteoarthritis    Osteopenia    Recurrent cold sores    Seasonal allergies    Syncope and collapse 11/30/2012   Normal EEG.  Vaso vagal syncope   Past Surgical History:  Procedure Laterality Date   BIOPSY  02/21/2020   Procedure: BIOPSY;  Surgeon: Iva Boop, MD;  Location: [facility] ENDOSCOPY;  Service: Endoscopy;;   COLON RESECTION N/A 10/14/2013   Procedure: LAPAROSCOPIC MOBILIZATION OF SPLENIC FLEXURE, LAPAROSCOPIC EXTENDED LEFT COLECTOMY;  Surgeon: Atilano Ina, MD;  Location: [facility] OR;  Service: General;  Laterality: N/A;   COLON SURGERY N/A    Phreesia 06/04/2020   COLONOSCOPY N/A 10/12/2013   Procedure: COLONOSCOPY;  Surgeon: Beverley Fiedler, MD;  Location: [facility] ENDOSCOPY;  Service: Endoscopy;  Laterality: N/A;   COLONOSCOPY WITH PROPOFOL N/A 02/21/2020   Procedure: COLONOSCOPY WITH PROPOFOL;   Surgeon: Iva Boop, MD;  Location: [facility] ENDOSCOPY;  Service: Endoscopy;  Laterality: N/A;   DILATION AND CURETTAGE, DIAGNOSTIC / THERAPEUTIC  1987   OPEN REDUCTION INTERNAL FIXATION (ORIF) DISTAL RADIAL FRACTURE Right 08/08/2014   Procedure: OPEN REDUCTION INTERNAL FIXATION (ORIF) DISTAL RADIAL FRACTURE;  Surgeon: Dominica Severin, MD;  Location: [facility] OR;  Service: Orthopedics;  Laterality: Right;  DVR Crosslock, MD available sometime around 1430-1500   ORIF ANKLE FRACTURE Right 11/10/2019   Procedure: POSSIBLEOPEN REDUCTION INTERNAL FIXATION (ORIF) ANKLE FRACTURE;  Surgeon: Roby Lofts, MD;  Location: [facility] OR;  Service: Orthopedics;  Laterality: Right;   ORIF WRIST FRACTURE Left 11/10/2019   Procedure: OPEN REDUCTION INTERNAL FIXATION (ORIF) WRIST FRACTURE;  Surgeon: Roby Lofts, MD;  Location: [facility] OR;  Service: Orthopedics;  Laterality: Left;   TONSILLECTOMY     TOTAL KNEE ARTHROPLASTY Right 08/05/2016   Procedure: RIGHT TOTAL KNEE ARTHROPLASTY;  Surgeon: Kathryne Hitch, MD;  Location: [facility] OR;  Service: Orthopedics;  Laterality: Right;   Patient Active Problem List   Diagnosis Date Noted   Lumbar radiculopathy 09/26/2022   Spondylolisthesis of lumbar region 07/22/2022   Status post lumbar spinal fusion 07/22/2022   Ischemic stricture intestine (HCC)    Colitis 02/19/2020  Closed fracture of left distal radius 11/10/2019   Fall 11/10/2019   Closed right ankle fracture 11/09/2019   Ankle fracture 11/09/2019   Closed displaced fracture of styloid process of left ulna    Anxiety and depression 12/22/2016   Insulin resistance 10/15/2016   Presence of right artificial knee joint 10/14/2016   Vitamin D deficiency 04/08/2016   Constipation 09/20/2013   Chronic idiopathic constipation 07/05/2013   Recurrent cold sores    Osteoarthritis    Depression    Hyperlipidemia     PCP: Dr Terance Ice  REFERRING PROVIDER: Dr Lisbeth Renshaw  REFERRING DIAG: lumbar spinal  stenosis  Rationale for Evaluation and Treatment: Rehabilitation  THERAPY DIAG:  Chronic bilateral low back pain with right-sided sciatica  Other symptoms and signs involving the musculoskeletal system  Muscle spasm of back  Difficulty in walking, not elsewhere classified  Muscle weakness (generalized)  Cramp and spasm  ONSET DATE: 07/22/22  SUBJECTIVE:                                                                                                                                                                                           SUBJECTIVE STATEMENT: Patient reports that she saw MD Monday and he is pleased with her progress and she is now ok to discontinue her brace. She is doing well without the brace. She has some tightness in the R thoracic and lumbar paraspinals which she feels may be due to use of the cane. She has a treadmill at home and would like to use the treadmill because it is too hot to walk outside. Orthopedist gave her an injection in the L knee and started meloxacam which has helped the knee.   PERTINENT HISTORY:  LBP for 10 years on and off with significant pain in the past 1.5 years; arthritis; R TKA; L knee pain; fx bilat wrists ORIF; bowel resection; idiopathic constipation; depression; obesity  PAIN:  Are you having pain? Yes: NPRS scale: 0/10; this am was 3-4/10 at it's worst  Pain location: lower back; R side of spine  Pain description: aching; burning; muscle  Aggravating factors: walking Relieving factors: sitting; lying in the bed; muscle relaxer   PRECAUTIONS: post op lumbar fusion- to remain in lumbar brace until RTD 10/06/22; out of brace to sleep; no lifting; no bending hands past knees   WEIGHT BEARING RESTRICTIONS: No  FALLS:  Has patient fallen in last 6 months? 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   PATIENT SURVEYS:  FOTO 25; goal 42 10/08/22 - 40    SENSATION: WFL  MUSCLE LENGTH: Hamstrings: Right 75 deg; Left 70 deg Thomas test: tight bilat   POSTURE: rounded shoulders, forward head, and flexed trunk   PALPATION: Muscular tightness noted through hip flexors bilat; Rt thoracolumbar paraspinals  (Patient in lumbar brace limiting palpation)   LUMBAR ROM:   AROM eval 10/08/22  Flexion Fingers to knees  Fingers to ankles   Extension Neutral 20%   Right lateral flexion 60% pulling pain L LB 80%  Left lateral flexion 65% cramping L LB 80%  Right rotation NT 50%  Left rotation NT 50%   (Blank rows = not tested)  LOWER EXTREMITY ROM:  hip mobility WFL's except hip extension which is limited   (ROM generally limited by obesity)  Active  Right eval Left eval  Hip flexion    Hip extension    Hip abduction    Hip adduction    Hip internal rotation    Hip external rotation    Knee flexion    Knee extension    Ankle dorsiflexion    Ankle plantarflexion    Ankle inversion    Ankle eversion     (Blank rows = not tested)  LOWER EXTREMITY MMT:  assessed in sitting and supine - patient unable to lie sidelying or prone     10/08/22: tested in sidelying and supine   MMT Right eval 10/08/22 Left eval 10/08/22  Hip flexion 4 5 4  5-  Hip extension 4- 5- 4- 5-  Hip abduction 4 5- 4 4+  Hip adduction      Hip internal rotation      Hip external rotation      Knee flexion 5  5   Knee extension 5  5   Ankle dorsiflexion      Ankle plantarflexion      Ankle inversion       Ankle eversion       (Blank rows = not tested)  LUMBAR SPECIAL TESTS:  Straight leg raise test: Negative and Slump test: Negative  FUNCTIONAL TESTS:  5 times sit to stand: 18.46 sec using UE's to move sit to stand; reports cramping R side       10/08/22 - 14.28 sec  GAIT: Distance walked: 40 Assistive device utilized: no assistive Level of assistance: Complete Independence Comments: wide based gait; flexed forward at hips; limp R LE during Wt bearing R    [facility] Adult PT Treatment:                                                DATE: 10/08/22  Therapeutic Exercise: NuStep L7 x 8 min Sit to stand x 5 Back to wall - lifting hips from wall 3-5 sec x 10 Standing facing wall hip extension x 10 L, 7 R  Wall angel x 10  Travell piriformis stretch 2x30" (B) Hip abduction in hooklying green TB 3 sec x  10 R/L  Supine bridges + green TB in hip abduction 3 sec x 10  SKTC 4x10" Pilates single leg circles x5 CW/CWW each Bilat shoulder flexion holding 8# DB b/n hands 90 deg flexion to overhead x 10 --> added single leg extension form table top position Seated HS stretch (B) STS: hands braced on thighs --> arms crossed at chest --> cradling 8#DB x10 Fwd walking no AD  Bkwd walking by counter Side stepping + cane chest press  10/02/22  TherEx  Nustep L6x6 minutes BUEs/BLEs  Supine TA sets x15 with 3 second holds TA set + march x12 TA set + limited ROM SLR x10 B focus on core stability and controlled lower of LE PPT + limited range double bent knee leg raise/lower x6 Standing hip hikes x10 B Standing hip hikes + ABD x10 B   NMR  Tandem stance 3x30 seconds B solid surface  SLS with one foot on 4 inch step 3x30 seconds B     09/29/2022 Therapeutic Exercise: NuStep L6 x Travell piriformis stretch 2x30" (B) Hooklying bent knee fall out GTB x10 (B) --> hooklying clamshells GTB x15 Supine bridges + ball squeeze x15 --> staggered stance bridge 2x5 (B) SKTC 4x10" Pilates single  leg circles x5 CW/CWW each Bilat shoulder flexion holding 8# DB b/n hands 90 deg flexion to overhead x 10 --> added single leg extension form table top position Seated HS stretch (B) STS: hands braced on thighs --> arms crossed at chest --> cradling 8#DB x10 Fwd walking no AD  Bkwd walking by counter Side stepping + cane chest press    [facility] Adult PT Treatment:                                                DATE: 09/24/2022 Therapeutic Exercise: NuStep L6 x Supine: Piriformis stretch R travell with strap distal thigh 30 sec x 5(foot resting on opposite thigh) Hip abduction in hooklying green TB 3 sec x 10 R/L (issued blue TB for home) Supine bridges + hip abduction green TB 3 sec x 10  Hooklying ball squeeze 5 sec x 10  Supine bridges + ball squeeze 3 sec 10  Hooklying glute set with hip add isometric ball squeeze 3 sec x 10  Single knee to chest 5 sec x5 R/L Bilat shoulder flexion holding 8# DB btn hands 90 deg flexion to overhead x 10 - VC to engage core   Seated:  Hamstring stretch 30 sec x 2 Sit to stand VC to engage core x 10   Standing  B Shoulder flexion facing counter sliding hands up cabinet 3 sec x 10 Wall push up 3 sec x 10  Row green 3 sec x 10  Shoulder extension 3 sec x 10 Side stepping GTB above knees (counter) --> YTB crossed at ankles Shoulder flexion at counter fingers climbing up cabinet core tight x 10     PATIENT EDUCATION:  Education details: Updated HEP  Person educated: Patient Education method: Explanation, Demonstration, Tactile cues, Verbal cues, and Handouts Education comprehension: verbalized understanding, returned demonstration, verbal cues required, tactile cues required, and needs further education  HOME EXERCISE PROGRAM:  Access Code: [email or web address] URL: [email or web address] Date: 10/08/2022 Prepared by: Corlis Leak  Exercises - Supine Posterior Pelvic Tilt  - 1 x daily - 7 x weekly - 3 sets - 10 reps -  Supine Single  Knee to Chest Stretch  - 1 x daily - 7 x weekly - 1 sets - 3-5 reps - 10-30 sec hold - Supine Hip Adduction Isometric with Ball  - 1 x daily - 7 x weekly - 1 sets - 5 reps - Supine Bridge with Mini Swiss Ball Between Knees  - 1 x daily - 7 x weekly - 3 sets - 10 reps - 3 sec hold - Supine Hamstring Stretch with Strap  - 1 x daily - 7 x weekly - 1 sets - 3-5 reps - 30 sec hold - Supine ITB Stretch with Strap  - 1 x daily - 7 x weekly - 1 sets - 3-5 reps - 30 sec hold - Seated Quadratus Lumborum Stretch with Forward Bend  - 1 x daily - 7 x weekly - 2 sets - 10 reps - 5-10 sec hold - Seated Flexion Stretch with Swiss Ball  - 1 x daily - 7 x weekly - 2 sets - 10 reps - 5-10 sec hold - Seated Single Leg Hip Abduction with Resistance  - 1 x daily - 7 x weekly - 3 sets - 10 reps - Side Stepping with Resistance at Ankles and Counter Support  - 1 x daily - 7 x weekly - 3 sets - 10 reps - Squat with Chair Touch  - 1 x daily - 7 x weekly - 3 sets - 10 reps - Supine Piriformis Stretch with Leg Straight  - 1 x daily - 7 x weekly - 1 sets - 3 reps - 30 sec  hold - Hooklying Isometric Clamshell  - 1 x daily - 7 x weekly - 1 sets - 10 reps - 3 sec  hold - Standing Bilateral Low Shoulder Row with Anchored Resistance  - 2 x daily - 7 x weekly - 1-3 sets - 10 reps - 2-3 sec  hold - Shoulder extension with resistance - Neutral  - 1 x daily - 7 x weekly - 1-2 sets - 10 reps - 3-5 sec  hold - Hooklying Shoulder I  - 1 x daily - 7 x weekly - 1 sets - 10 reps - 3 sec  hold - Shoulder Flexion Wall Slide with Towel  - 1 x daily - 7 x weekly - 1-2 sets - 10 reps - 3 sec  hold - Wall Push Up  - 1 x daily - 7 x weekly - 1-3 sets - 10 reps - 3 sec  hold - Standing Transverse Abdominis Contraction  - 1 x daily - 7 x weekly - 3 sets - 10 reps - Supine March with Posterior Pelvic Tilt  - 1 x daily - 7 x weekly - 3 sets - 10 reps - Standing Hip Hiking  - 1 x daily - 7 x weekly - 3 sets - 10 reps - Standing Hip Extension with  Counter Support  - 1 x daily - 7 x weekly - 1-2 sets - 10 reps - 3-5 sec  hold - Wall Angels  - 1 x daily - 7 x weekly - 1-3 sets - 10 reps - 2-3 sec  hold  ASSESSMENT:  CLINICAL IMPRESSION:  Excellent progress with rehab. Patient has accomplished short term goals and is working well toward long term goals. Added exercises to work on core Archivist. Patient will benefit from continued therapy to accomplish goals of therapy.  Continued focus on functional strengthening, core stability, gait, balance.   GOALS: Goals reviewed  with patient? Yes  SHORT TERM GOALS: Target date: 10/14/2022  Independent in initial HEP Baseline: Goal status: met  2.  Patient reports/demonstrates ability to stand for 5-10 min for light household and grooming tasks  Baseline:  Goal status: met  3.  Independent in gait with least restrictive assistive device for 500 feet without back spasm  Baseline:  Goal status: met   LONG TERM GOALS: Target date: 11/25/2022  Increase core strength and stability with patient to demonstrate ability to participate in 20 min of exercise without increase in symptoms  Baseline:  Goal status: on going   2.  4+/5 to 5/5 strength bilat LE's  Baseline:  Goal status: partially met   3.  Decrease sit to stand times test by 3-5 seconds  Baseline:  Goal status: met  4.  Patient demonstrates/reports ability to ambulate community distances for functional tasks with least restrictive assistive device (at least 1000 feet)  Baseline:  Goal status: on going   5.  Patient reports ability to do laundry at home  Baseline: doing dishes Goal status: partially accomplished  6.  Independent in HEP including aquatic program as indicated  Baseline:  Goal status: on going   7.  Improve functional limitation score to 42  Baseline: 25  10/08/22: 40  Goal status: partially accomplished   PLAN:  PT FREQUENCY: 2x/week  PT DURATION: 12 weeks  PLANNED  INTERVENTIONS: Therapeutic exercises, Therapeutic activity, Neuromuscular re-education, Balance training, Gait training, Patient/Family education, Self Care, Joint mobilization, Aquatic Therapy, Dry Needling, Electrical stimulation, Spinal mobilization, Cryotherapy, Moist heat, Taping, Ultrasound, Ionotophoresis 4mg /ml Dexamethasone, Manual therapy, and Re-evaluation.  PLAN FOR NEXT SESSION: No side lying or quadruped (too painful). Lifting mechanics, progress exercises as toerated; continue spine care and body mechanics education; manual work, DN, modalities as indicated. Balance as time allows   S[place] Moise P. Leonor Liv PT, MPH 10/08/22 3:48 PM

## 2022-10-13 ENCOUNTER — Encounter: Payer: Self-pay | Admitting: Rehabilitative and Restorative Service Providers"

## 2022-10-13 ENCOUNTER — Ambulatory Visit: Payer: Medicare Other | Attending: Neurosurgery | Admitting: Rehabilitative and Restorative Service Providers"

## 2022-10-13 DIAGNOSIS — R252 Cramp and spasm: Secondary | ICD-10-CM | POA: Diagnosis present

## 2022-10-13 DIAGNOSIS — M6281 Muscle weakness (generalized): Secondary | ICD-10-CM

## 2022-10-13 DIAGNOSIS — R29898 Other symptoms and signs involving the musculoskeletal system: Secondary | ICD-10-CM

## 2022-10-13 DIAGNOSIS — M5441 Lumbago with sciatica, right side: Secondary | ICD-10-CM | POA: Insufficient documentation

## 2022-10-13 DIAGNOSIS — G8929 Other chronic pain: Secondary | ICD-10-CM | POA: Diagnosis present

## 2022-10-13 DIAGNOSIS — M62830 Muscle spasm of back: Secondary | ICD-10-CM | POA: Diagnosis present

## 2022-10-13 DIAGNOSIS — R262 Difficulty in walking, not elsewhere classified: Secondary | ICD-10-CM

## 2022-10-13 NOTE — Therapy (Signed)
OUTPATIENT PHYSICAL THERAPY THORACOLUMBAR TREATMENT    Patient Name: Monique Gonzalez MRN: [id number] DOB:03-Dec-1955, 67 y.o., female Today's Date: 10/13/2022  END OF SESSION:  PT End of Session - 10/13/22 1539     Visit Number 11    Number of Visits 24    Date for PT Re-Evaluation 11/25/22    Authorization Type medicare & [employer] of [place]    Progress Note Due on Visit 20    PT Start Time 1532    PT Stop Time 1616    PT Time Calculation (min) 44 min              Past Medical History:  Diagnosis Date   Allergy    Anxiety    Phreesia 06/04/2020   Arthritis    Phreesia 07/08/2020   Chronic pain    back and knee   Colitis 09/2012   infectious vs inflammatory.    Colon stricture [facility])    Depression    Depression    Phreesia 06/04/2020   Depression    Phreesia 07/08/2020   GERD (gastroesophageal reflux disease)    Phreesia 06/04/2020   Hyperlipidemia    Obesity    Osteoarthritis    Osteopenia    Recurrent cold sores    Seasonal allergies    Syncope and collapse 11/30/2012   Normal EEG.  Vaso vagal syncope   Past Surgical History:  Procedure Laterality Date   BIOPSY  02/21/2020   Procedure: BIOPSY;  Surgeon: Iva Boop, MD;  Location: [facility] ENDOSCOPY;  Service: Endoscopy;;   COLON RESECTION N/A 10/14/2013   Procedure: LAPAROSCOPIC MOBILIZATION OF SPLENIC FLEXURE, LAPAROSCOPIC EXTENDED LEFT COLECTOMY;  Surgeon: Atilano Ina, MD;  Location: [facility] OR;  Service: General;  Laterality: N/A;   COLON SURGERY N/A    Phreesia 06/04/2020   COLONOSCOPY N/A 10/12/2013   Procedure: COLONOSCOPY;  Surgeon: Beverley Fiedler, MD;  Location: [facility] ENDOSCOPY;  Service: Endoscopy;  Laterality: N/A;   COLONOSCOPY WITH PROPOFOL N/A 02/21/2020   Procedure: COLONOSCOPY WITH PROPOFOL;  Surgeon: Iva Boop, MD;  Location: [facility] ENDOSCOPY;  Service: Endoscopy;  Laterality: N/A;   DILATION AND CURETTAGE, DIAGNOSTIC / THERAPEUTIC  1987   OPEN REDUCTION INTERNAL FIXATION (ORIF) DISTAL RADIAL  FRACTURE Right 08/08/2014   Procedure: OPEN REDUCTION INTERNAL FIXATION (ORIF) DISTAL RADIAL FRACTURE;  Surgeon: Dominica Severin, MD;  Location: [facility] OR;  Service: Orthopedics;  Laterality: Right;  DVR Crosslock, MD available sometime around 1430-1500   ORIF ANKLE FRACTURE Right 11/10/2019   Procedure: POSSIBLEOPEN REDUCTION INTERNAL FIXATION (ORIF) ANKLE FRACTURE;  Surgeon: Roby Lofts, MD;  Location: [facility] OR;  Service: Orthopedics;  Laterality: Right;   ORIF WRIST FRACTURE Left 11/10/2019   Procedure: OPEN REDUCTION INTERNAL FIXATION (ORIF) WRIST FRACTURE;  Surgeon: Roby Lofts, MD;  Location: [facility] OR;  Service: Orthopedics;  Laterality: Left;   TONSILLECTOMY     TOTAL KNEE ARTHROPLASTY Right 08/05/2016   Procedure: RIGHT TOTAL KNEE ARTHROPLASTY;  Surgeon: Kathryne Hitch, MD;  Location: [facility] OR;  Service: Orthopedics;  Laterality: Right;   Patient Active Problem List   Diagnosis Date Noted   Lumbar radiculopathy 09/26/2022   Spondylolisthesis of lumbar region 07/22/2022   Status post lumbar spinal fusion 07/22/2022   Ischemic stricture intestine (HCC)    Colitis 02/19/2020   Closed fracture of left distal radius 11/10/2019   Fall 11/10/2019   Closed right ankle fracture 11/09/2019   Ankle fracture 11/09/2019   Closed displaced fracture of styloid process of left ulna  Anxiety and depression 12/22/2016   Insulin resistance 10/15/2016   Presence of right artificial knee joint 10/14/2016   Vitamin D deficiency 04/08/2016   Constipation 09/20/2013   Chronic idiopathic constipation 07/05/2013   Recurrent cold sores    Osteoarthritis    Depression    Hyperlipidemia     PCP: Dr Terance Ice  REFERRING PROVIDER: Dr Lisbeth Renshaw  REFERRING DIAG: lumbar spinal stenosis  Rationale for Evaluation and Treatment: Rehabilitation  THERAPY DIAG:  Chronic bilateral low back pain with right-sided sciatica  Other symptoms and signs involving the musculoskeletal  system  Muscle spasm of back  Difficulty in walking, not elsewhere classified  Muscle weakness (generalized)  Cramp and spasm  ONSET DATE: 07/22/22  SUBJECTIVE:                                                                                                                                                                                           SUBJECTIVE STATEMENT: Patient reports that she is only using the cane when she is out on unfamiliar surfaces or longer periods of time. Some tightness in the shoulder area but no back pain. She has walking on the treadmill for about 10 min before she has increased L knee pain. She can ride the bike for 30 min.  Orthopedist gave her an injection in the L knee and started meloxacam which has helped the knee.   PERTINENT HISTORY:  LBP for 10 years on and off with significant pain in the past 1.5 years; arthritis; R TKA; L knee pain; fx bilat wrists ORIF; bowel resection; idiopathic constipation; depression; obesity  PAIN:  Are you having pain? Yes: NPRS scale: 0/10; this am was 3-4/10 at it's worst  Pain location: lower back; R side of spine  Pain description: aching; burning; muscle  Aggravating factors: walking Relieving factors: sitting; lying in the bed; muscle relaxer   PRECAUTIONS: post op lumbar fusion- to remain in lumbar brace until RTD 10/06/22; out of brace to sleep; no lifting; no bending hands past knees   WEIGHT BEARING RESTRICTIONS: No  FALLS:  Has patient fallen in last 6 months? 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   PATIENT SURVEYS:  FOTO 25; goal 42 10/08/22 - 40    SENSATION: WFL  MUSCLE LENGTH: Hamstrings: Right 75 deg; Left 70 deg Thomas test: tight bilat   POSTURE: rounded shoulders, forward head, and flexed trunk   PALPATION: Muscular tightness noted through hip flexors bilat; Rt thoracolumbar paraspinals  (Patient in lumbar brace limiting palpation)   LUMBAR ROM:   AROM eval 10/08/22  Flexion Fingers to knees  Fingers to ankles   Extension Neutral 20%   Right lateral flexion 60% pulling pain L LB 80%  Left lateral flexion 65% cramping L LB 80%  Right rotation NT 50%  Left rotation NT 50%   (Blank rows = not tested)  LOWER EXTREMITY ROM:  hip mobility WFL's except hip extension which is limited   (ROM generally limited by obesity)  Active  Right eval Left eval  Hip flexion    Hip extension    Hip abduction    Hip adduction    Hip internal rotation    Hip external rotation    Knee flexion    Knee extension    Ankle dorsiflexion    Ankle plantarflexion    Ankle inversion    Ankle eversion     (Blank rows = not tested)  LOWER EXTREMITY MMT:  assessed in sitting and supine - patient unable to lie sidelying or prone     10/08/22: tested in sidelying and supine   MMT Right eval 10/08/22 Left eval 10/08/22  Hip flexion 4 5 4  5-  Hip extension 4- 5- 4- 5-  Hip abduction 4 5- 4 4+  Hip adduction      Hip internal rotation      Hip external rotation      Knee flexion 5  5   Knee extension 5  5   Ankle dorsiflexion      Ankle plantarflexion      Ankle inversion      Ankle eversion       (Blank rows = not tested)  LUMBAR SPECIAL TESTS:  Straight leg raise test: Negative and Slump test: Negative  FUNCTIONAL TESTS:  5 times sit to stand: 18.46 sec using UE's to move sit to stand; reports cramping R side       10/08/22 - 14.28 sec   GAIT: Distance walked: 40 Assistive device utilized: no assistive Level of assistance: Complete Independence Comments: wide based gait; flexed forward at hips; limp R LE during Wt bearing R    [facility] Adult PT Treatment:                                                DATE: 10/13/22  Therapeutic Exercise: NuStep L7 x 8 min Sit to stand x 10 from nustep seat Back to wall - lifting hips from wall, keeping knees straight  3-5 sec x 10 Standing facing wall hip extension x 10 L/R  Standing to work on posture and alignment Walking with step stop pattern to focus on posture, alignment and balance Hip abduction leading with heel UE support as needed for balance 10 R/L  Heel raises UE support as needed for balance x 10  Side steps at counter focus on feet straight smaller step     [facility] Adult PT Treatment:  DATE: 10/08/22  Therapeutic Exercise: NuStep L7 x 8 min Sit to stand x 5 Back to wall - lifting hips from wall 3-5 sec x 10 Standing facing wall hip extension x 10 L, 7 R  Wall angel x 10  Travell piriformis stretch 2x30" (B) Hip abduction in hooklying green TB 3 sec x 10 R/L  Supine bridges + green TB in hip abduction 3 sec x 10  SKTC 4x10" Pilates single leg circles x5 CW/CWW each Bilat shoulder flexion holding 8# DB b/n hands 90 deg flexion to overhead x 10 --> added single leg extension form table top position Seated HS stretch (B) STS: hands braced on thighs --> arms crossed at chest --> cradling 8#DB x10 Fwd walking no AD  Bkwd walking by counter Side stepping + cane chest press  10/02/22  TherEx  Nustep L6x6 minutes BUEs/BLEs  Supine TA sets x15 with 3 second holds TA set + march x12 TA set + limited ROM SLR x10 B focus on core stability and controlled lower of LE PPT + limited range double bent knee leg raise/lower x6 Standing hip hikes x10 B Standing hip hikes + ABD x10 B   NMR  Tandem stance 3x30 seconds B solid  surface  SLS with one foot on 4 inch step 3x30 seconds B     09/29/2022 Therapeutic Exercise: NuStep L6 x Travell piriformis stretch 2x30" (B) Hooklying bent knee fall out GTB x10 (B) --> hooklying clamshells GTB x15 Supine bridges + ball squeeze x15 --> staggered stance bridge 2x5 (B) SKTC 4x10" Pilates single leg circles x5 CW/CWW each Bilat shoulder flexion holding 8# DB b/n hands 90 deg flexion to overhead x 10 --> added single leg extension form table top position Seated HS stretch (B) STS: hands braced on thighs --> arms crossed at chest --> cradling 8#DB x10 Fwd walking no AD  Bkwd walking by counter Side stepping + cane chest press    [facility] Adult PT Treatment:                                                DATE: 09/24/2022 Therapeutic Exercise: NuStep L6 x Supine: Piriformis stretch R travell with strap distal thigh 30 sec x 5(foot resting on opposite thigh) Hip abduction in hooklying green TB 3 sec x 10 R/L (issued blue TB for home) Supine bridges + hip abduction green TB 3 sec x 10  Hooklying ball squeeze 5 sec x 10  Supine bridges + ball squeeze 3 sec 10  Hooklying glute set with hip add isometric ball squeeze 3 sec x 10  Single knee to chest 5 sec x5 R/L Bilat shoulder flexion holding 8# DB btn hands 90 deg flexion to overhead x 10 - VC to engage core   Seated:  Hamstring stretch 30 sec x 2 Sit to stand VC to engage core x 10   Standing  B Shoulder flexion facing counter sliding hands up cabinet 3 sec x 10 Wall push up 3 sec x 10  Row green 3 sec x 10  Shoulder extension 3 sec x 10 Side stepping GTB above knees (counter) --> YTB crossed at ankles Shoulder flexion at counter fingers climbing up cabinet core tight x 10     PATIENT EDUCATION:  Education details: Updated HEP  Person educated: Patient Education method: Explanation, Demonstration, Tactile cues, Verbal  cues, and Handouts Education comprehension: verbalized understanding, returned  demonstration, verbal cues required, tactile cues required, and needs further education  HOME EXERCISE PROGRAM:  Access Code: [email or web address] URL: [email or web address] Date: 10/08/2022 Prepared by: Corlis Leak  Exercises - Supine Posterior Pelvic Tilt  - 1 x daily - 7 x weekly - 3 sets - 10 reps - Supine Single Knee to Chest Stretch  - 1 x daily - 7 x weekly - 1 sets - 3-5 reps - 10-30 sec hold - Supine Hip Adduction Isometric with Ball  - 1 x daily - 7 x weekly - 1 sets - 5 reps - Supine Bridge with Mini Swiss Ball Between Knees  - 1 x daily - 7 x weekly - 3 sets - 10 reps - 3 sec hold - Supine Hamstring Stretch with Strap  - 1 x daily - 7 x weekly - 1 sets - 3-5 reps - 30 sec hold - Supine ITB Stretch with Strap  - 1 x daily - 7 x weekly - 1 sets - 3-5 reps - 30 sec hold - Seated Quadratus Lumborum Stretch with Forward Bend  - 1 x daily - 7 x weekly - 2 sets - 10 reps - 5-10 sec hold - Seated Flexion Stretch with Swiss Ball  - 1 x daily - 7 x weekly - 2 sets - 10 reps - 5-10 sec hold - Seated Single Leg Hip Abduction with Resistance  - 1 x daily - 7 x weekly - 3 sets - 10 reps - Side Stepping with Resistance at Ankles and Counter Support  - 1 x daily - 7 x weekly - 3 sets - 10 reps - Squat with Chair Touch  - 1 x daily - 7 x weekly - 3 sets - 10 reps - Supine Piriformis Stretch with Leg Straight  - 1 x daily - 7 x weekly - 1 sets - 3 reps - 30 sec  hold - Hooklying Isometric Clamshell  - 1 x daily - 7 x weekly - 1 sets - 10 reps - 3 sec  hold - Standing Bilateral Low Shoulder Row with Anchored Resistance  - 2 x daily - 7 x weekly - 1-3 sets - 10 reps - 2-3 sec  hold - Shoulder extension with resistance - Neutral  - 1 x daily - 7 x weekly - 1-2 sets - 10 reps - 3-5 sec  hold - Hooklying Shoulder I  - 1 x daily - 7 x weekly - 1 sets - 10 reps - 3 sec  hold - Shoulder Flexion Wall Slide with Towel  - 1 x daily - 7 x weekly - 1-2 sets - 10 reps - 3 sec  hold - Wall Push Up  - 1 x  daily - 7 x weekly - 1-3 sets - 10 reps - 3 sec  hold - Standing Transverse Abdominis Contraction  - 1 x daily - 7 x weekly - 3 sets - 10 reps - Supine March with Posterior Pelvic Tilt  - 1 x daily - 7 x weekly - 3 sets - 10 reps - Standing Hip Hiking  - 1 x daily - 7 x weekly - 3 sets - 10 reps - Standing Hip Extension with Counter Support  - 1 x daily - 7 x weekly - 1-2 sets - 10 reps - 3-5 sec  hold - Wall Angels  - 1 x daily - 7 x weekly - 1-3 sets - 10 reps - 2-3 sec  hold  ASSESSMENT:  CLINICAL IMPRESSION:  Continued work on strengthening and stabilization focus on core work in standing. Working on walking and gait to decreased limp and improve gait pattern. Will continue with standing exercise and gait slowing pattern. Patient fatigues with exercises and felt faint. She rested lying down and recovered. Encouraged patient to see MD re-fatigue and feeling faint. Therapy will continue to focus on functional strengthening, core stability, gait, balance.  BP: 134/80 HR: 92  GOALS: Goals reviewed with patient? Yes  SHORT TERM GOALS: Target date: 10/14/2022  Independent in initial HEP Baseline: Goal status: met  2.  Patient reports/demonstrates ability to stand for 5-10 min for light household and grooming tasks  Baseline:  Goal status: met  3.  Independent in gait with least restrictive assistive device for 500 feet without back spasm  Baseline:  Goal status: met   LONG TERM GOALS: Target date: 11/25/2022  Increase core strength and stability with patient to demonstrate ability to participate in 20 min of exercise without increase in symptoms  Baseline:  Goal status: on going   2.  4+/5 to 5/5 strength bilat LE's  Baseline:  Goal status: partially met   3.  Decrease sit to stand times test by 3-5 seconds  Baseline:  Goal status: met  4.  Patient demonstrates/reports ability to ambulate community distances for functional tasks with least restrictive assistive device (at  least 1000 feet)  Baseline:  Goal status: on going   5.  Patient reports ability to do laundry at home  Baseline: doing dishes Goal status: partially accomplished  6.  Independent in HEP including aquatic program as indicated  Baseline:  Goal status: on going   7.  Improve functional limitation score to 42  Baseline: 25  10/08/22: 40  Goal status: partially accomplished   PLAN:  PT FREQUENCY: 2x/week  PT DURATION: 12 weeks  PLANNED INTERVENTIONS: Therapeutic exercises, Therapeutic activity, Neuromuscular re-education, Balance training, Gait training, Patient/Family education, Self Care, Joint mobilization, Aquatic Therapy, Dry Needling, Electrical stimulation, Spinal mobilization, Cryotherapy, Moist heat, Taping, Ultrasound, Ionotophoresis 4mg /ml Dexamethasone, Manual therapy, and Re-evaluation.  PLAN FOR NEXT SESSION: No side lying or quadruped (too painful). Lifting mechanics, progress exercises as toerated; continue spine care and body mechanics education; manual work, DN, modalities as indicated. Balance as time allows    P. Leonor Liv PT, MPH 10/13/22 3:40 PM

## 2022-10-16 ENCOUNTER — Encounter: Payer: Self-pay | Admitting: Rehabilitative and Restorative Service Providers"

## 2022-10-16 ENCOUNTER — Ambulatory Visit: Payer: Medicare Other | Admitting: Rehabilitative and Restorative Service Providers"

## 2022-10-16 DIAGNOSIS — M62830 Muscle spasm of back: Secondary | ICD-10-CM

## 2022-10-16 DIAGNOSIS — M5441 Lumbago with sciatica, right side: Secondary | ICD-10-CM | POA: Diagnosis not present

## 2022-10-16 DIAGNOSIS — R29898 Other symptoms and signs involving the musculoskeletal system: Secondary | ICD-10-CM

## 2022-10-16 DIAGNOSIS — G8929 Other chronic pain: Secondary | ICD-10-CM

## 2022-10-16 DIAGNOSIS — R262 Difficulty in walking, not elsewhere classified: Secondary | ICD-10-CM

## 2022-10-16 DIAGNOSIS — M6281 Muscle weakness (generalized): Secondary | ICD-10-CM

## 2022-10-16 DIAGNOSIS — R252 Cramp and spasm: Secondary | ICD-10-CM

## 2022-10-16 NOTE — Therapy (Signed)
OUTPATIENT PHYSICAL THERAPY THORACOLUMBAR TREATMENT    Patient Name: Monique Gonzalez MRN: [id number] DOB:1955/11/01, 67 y.o., female Today's Date: 10/16/2022  END OF SESSION:  PT End of Session - 10/16/22 1310     Visit Number 12    Number of Visits 24    Date for PT Re-Evaluation 11/25/22    Authorization Type medicare & [employer] of [place]    Progress Note Due on Visit 20    PT Start Time 1311    PT Stop Time 1356    PT Time Calculation (min) 45 min    Activity Tolerance Patient tolerated treatment well              Past Medical History:  Diagnosis Date   Allergy    Anxiety    Phreesia 06/04/2020   Arthritis    Phreesia 07/08/2020   Chronic pain    back and knee   Colitis 09/2012   infectious vs inflammatory.    Colon stricture [facility])    Depression    Depression    Phreesia 06/04/2020   Depression    Phreesia 07/08/2020   GERD (gastroesophageal reflux disease)    Phreesia 06/04/2020   Hyperlipidemia    Obesity    Osteoarthritis    Osteopenia    Recurrent cold sores    Seasonal allergies    Syncope and collapse 11/30/2012   Normal EEG.  Vaso vagal syncope   Past Surgical History:  Procedure Laterality Date   BIOPSY  02/21/2020   Procedure: BIOPSY;  Surgeon: Iva Boop, MD;  Location: [facility] ENDOSCOPY;  Service: Endoscopy;;   COLON RESECTION N/A 10/14/2013   Procedure: LAPAROSCOPIC MOBILIZATION OF SPLENIC FLEXURE, LAPAROSCOPIC EXTENDED LEFT COLECTOMY;  Surgeon: Atilano Ina, MD;  Location: [facility] OR;  Service: General;  Laterality: N/A;   COLON SURGERY N/A    Phreesia 06/04/2020   COLONOSCOPY N/A 10/12/2013   Procedure: COLONOSCOPY;  Surgeon: Beverley Fiedler, MD;  Location: [facility] ENDOSCOPY;  Service: Endoscopy;  Laterality: N/A;   COLONOSCOPY WITH PROPOFOL N/A 02/21/2020   Procedure: COLONOSCOPY WITH PROPOFOL;  Surgeon: Iva Boop, MD;  Location: [facility] ENDOSCOPY;  Service: Endoscopy;  Laterality: N/A;   DILATION AND CURETTAGE, DIAGNOSTIC / THERAPEUTIC  1987   OPEN  REDUCTION INTERNAL FIXATION (ORIF) DISTAL RADIAL FRACTURE Right 08/08/2014   Procedure: OPEN REDUCTION INTERNAL FIXATION (ORIF) DISTAL RADIAL FRACTURE;  Surgeon: Dominica Severin, MD;  Location: [facility] OR;  Service: Orthopedics;  Laterality: Right;  DVR Crosslock, MD available sometime around 1430-1500   ORIF ANKLE FRACTURE Right 11/10/2019   Procedure: POSSIBLEOPEN REDUCTION INTERNAL FIXATION (ORIF) ANKLE FRACTURE;  Surgeon: Roby Lofts, MD;  Location: [facility] OR;  Service: Orthopedics;  Laterality: Right;   ORIF WRIST FRACTURE Left 11/10/2019   Procedure: OPEN REDUCTION INTERNAL FIXATION (ORIF) WRIST FRACTURE;  Surgeon: Roby Lofts, MD;  Location: [facility] OR;  Service: Orthopedics;  Laterality: Left;   TONSILLECTOMY     TOTAL KNEE ARTHROPLASTY Right 08/05/2016   Procedure: RIGHT TOTAL KNEE ARTHROPLASTY;  Surgeon: Kathryne Hitch, MD;  Location: [facility] OR;  Service: Orthopedics;  Laterality: Right;   Patient Active Problem List   Diagnosis Date Noted   Lumbar radiculopathy 09/26/2022   Spondylolisthesis of lumbar region 07/22/2022   Status post lumbar spinal fusion 07/22/2022   Ischemic stricture intestine (HCC)    Colitis 02/19/2020   Closed fracture of left distal radius 11/10/2019   Fall 11/10/2019   Closed right ankle fracture 11/09/2019   Ankle fracture 11/09/2019  Closed displaced fracture of styloid process of left ulna    Anxiety and depression 12/22/2016   Insulin resistance 10/15/2016   Presence of right artificial knee joint 10/14/2016   Vitamin D deficiency 04/08/2016   Constipation 09/20/2013   Chronic idiopathic constipation 07/05/2013   Recurrent cold sores    Osteoarthritis    Depression    Hyperlipidemia     PCP: Dr Terance Ice  REFERRING PROVIDER: Dr Lisbeth Renshaw  REFERRING DIAG: lumbar spinal stenosis  Rationale for Evaluation and Treatment: Rehabilitation  THERAPY DIAG:  Chronic bilateral low back pain with right-sided sciatica  Other symptoms  and signs involving the musculoskeletal system  Muscle spasm of back  Difficulty in walking, not elsewhere classified  Muscle weakness (generalized)  Cramp and spasm  ONSET DATE: 07/22/22  SUBJECTIVE:                                                                                                                                                                                           SUBJECTIVE STATEMENT: Patient reports no more episodes of fatigue or feeling faint. She has taken it easy the past couple of days. She feels good today. She is still only using the cane when she is out on unfamiliar surfaces or longer periods of time. Some tightness in the shoulder area but no back pain. She can walk on the treadmill for about 10 min before she has increased L knee pain. She can ride the bike for 30 min.  Orthopedist gave her an injection in the L knee and started meloxacam which has helped the knee.   PERTINENT HISTORY:  LBP for 10 years on and off with significant pain in the past 1.5 years; arthritis; R TKA; L knee pain; fx bilat wrists ORIF; bowel resection; idiopathic constipation; depression; obesity  PAIN:  Are you having pain? Yes: NPRS scale: 0/10; this am was 3-4/10 at it's worst  Pain location: lower back; R side of spine  Pain description: aching; burning; muscle  Aggravating factors: walking Relieving factors: sitting; lying in the bed; muscle relaxer   PRECAUTIONS: post op lumbar fusion- to remain in lumbar brace until RTD 10/06/22; out of brace to sleep; no lifting; no bending hands past knees   WEIGHT BEARING RESTRICTIONS: No  FALLS:  Has patient fallen in last 6 months? 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   PATIENT SURVEYS:  FOTO 25; goal 42 10/08/22 - 40    SENSATION: WFL  MUSCLE LENGTH: Hamstrings: Right 75 deg; Left 70 deg Thomas test: tight bilat   POSTURE: rounded shoulders, forward head, and flexed trunk   PALPATION: Muscular tightness noted through hip flexors bilat; Rt thoracolumbar paraspinals  (Patient in lumbar brace limiting palpation)   LUMBAR ROM:   AROM eval 10/08/22  Flexion Fingers to knees  Fingers to ankles   Extension Neutral 20%   Right lateral flexion 60% pulling pain L LB 80%  Left lateral flexion 65% cramping L LB 80%  Right rotation NT 50%  Left rotation NT 50%   (Blank rows = not tested)  LOWER EXTREMITY ROM:  hip mobility WFL's except hip extension which is limited   (ROM generally limited by obesity)  Active  Right eval Left eval  Hip flexion    Hip extension    Hip abduction    Hip adduction    Hip internal rotation    Hip external rotation    Knee flexion    Knee extension    Ankle dorsiflexion    Ankle plantarflexion    Ankle inversion    Ankle eversion     (Blank rows = not tested)  LOWER EXTREMITY MMT:  assessed in sitting and supine - patient unable to lie sidelying or prone     10/08/22: tested in sidelying and supine   MMT Right eval 10/08/22 Left eval 10/08/22  Hip flexion 4 5 4  5-  Hip extension 4- 5- 4- 5-  Hip abduction 4 5- 4 4+  Hip adduction      Hip internal rotation      Hip external rotation      Knee flexion 5  5   Knee extension 5  5   Ankle dorsiflexion      Ankle plantarflexion      Ankle inversion      Ankle eversion       (Blank rows = not tested)  LUMBAR SPECIAL TESTS:  Straight leg raise test: Negative and Slump test:  Negative  FUNCTIONAL TESTS:  5 times sit to stand: 18.46 sec using UE's to move sit to stand; reports cramping R side       10/08/22 - 14.28 sec  GAIT: Distance walked: 40 Assistive device utilized: no assistive Level of assistance: Complete Independence Comments: wide based gait; flexed forward at hips; limp R LE during Wt bearing R    [facility] Adult PT Treatment:                                                DATE: 10/16/22  Therapeutic Exercise: NuStep L7 x 8 min Row green TB 3 sec x 10 x 2  Shoulder extension green TB 3 sec x 10 x 2  Antirotation green TB 3 sec x 10 R/L Sit to stand x 10  Back to wall - lifting hips from wall, keeping knees straight  5 sec x 10 Hip abduction leading with heel UE support as needed for balance 10 R/L x 2 sets  Standing facing wall hip extension x 10 L/R  Sitting arm angel x 10  Sitting backward shoulder rolls x 10  Sitting lateral cervical flexion 5 sec x 3    [facility] Adult PT Treatment:                                                DATE: 10/13/22  Therapeutic Exercise: NuStep L7 x 8 min Sit to stand x 10 from nustep seat Back to wall - lifting hips from wall, keeping knees straight  3-5 sec x 10 Standing facing wall hip extension x 10 L/R  Standing to work on posture and alignment Walking with step stop pattern to focus on posture, alignment and balance Hip abduction leading with heel UE support as needed for balance 10 R/L  Heel raises UE support as needed for balance x 10  Side steps at counter focus on feet straight smaller step     [facility] Adult PT Treatment:                                                DATE: 10/08/22  Therapeutic Exercise: NuStep L7 x 8 min Sit to stand x 5 Back to wall - lifting hips from wall 3-5 sec x 10 Standing facing wall hip extension x 10 L, 7 R  Wall angel x 10  Travell piriformis stretch 2x30" (B) Hip abduction in hooklying green TB 3 sec x 10 R/L  Supine bridges + green TB in hip abduction 3 sec x 10  SKTC  4x10" Pilates single leg circles x5 CW/CWW each Bilat shoulder flexion holding 8# DB b/n hands 90 deg flexion to overhead x 10 --> added single leg extension form table top position Seated HS stretch (B) STS: hands braced on thighs --> arms crossed at chest --> cradling 8#DB x10 Fwd walking no AD  Bkwd walking by counter Side stepping + cane chest press   PATIENT EDUCATION:  Education details: Updated HEP  Person educated: Patient Education method: Explanation, Demonstration, Tactile cues, Verbal cues, and Handouts Education comprehension: verbalized understanding, returned demonstration, verbal cues required, tactile cues required, and needs further education  HOME EXERCISE PROGRAM:  Access Code: [email or web address] URL: [email or web address] Date: 10/16/2022 Prepared by: Corlis Leak  Exercises - Supine Posterior Pelvic Tilt  - 1 x daily - 7 x weekly - 3 sets - 10 reps - Supine Single Knee to Chest Stretch  - 1 x daily - 7 x weekly - 1 sets - 3-5 reps - 10-30 sec hold - Supine Hip Adduction Isometric with Ball  - 1 x daily - 7 x weekly - 1 sets - 5 reps - Supine Bridge with Mini Swiss Ball Between Knees  - 1 x daily - 7 x weekly - 3 sets - 10 reps - 3 sec hold - Supine Hamstring Stretch with Strap  - 1 x daily - 7 x weekly - 1 sets - 3-5 reps - 30 sec hold - Supine ITB Stretch with Strap  - 1 x daily - 7 x weekly - 1 sets - 3-5 reps - 30 sec hold - Seated Quadratus Lumborum Stretch with Forward Bend  - 1 x daily - 7 x weekly - 2 sets -  10 reps - 5-10 sec hold - Seated Flexion Stretch with Swiss Ball  - 1 x daily - 7 x weekly - 2 sets - 10 reps - 5-10 sec hold - Seated Single Leg Hip Abduction with Resistance  - 1 x daily - 7 x weekly - 3 sets - 10 reps - Side Stepping with Resistance at Ankles and Counter Support  - 1 x daily - 7 x weekly - 3 sets - 10 reps - Squat with Chair Touch  - 1 x daily - 7 x weekly - 3 sets - 10 reps - Supine Piriformis Stretch with Leg Straight  - 1 x  daily - 7 x weekly - 1 sets - 3 reps - 30 sec  hold - Hooklying Isometric Clamshell  - 1 x daily - 7 x weekly - 1 sets - 10 reps - 3 sec  hold - Standing Bilateral Low Shoulder Row with Anchored Resistance  - 2 x daily - 7 x weekly - 1-3 sets - 10 reps - 2-3 sec  hold - Shoulder extension with resistance - Neutral  - 1 x daily - 7 x weekly - 1-2 sets - 10 reps - 3-5 sec  hold - Hooklying Shoulder I  - 1 x daily - 7 x weekly - 1 sets - 10 reps - 3 sec  hold - Shoulder Flexion Wall Slide with Towel  - 1 x daily - 7 x weekly - 1-2 sets - 10 reps - 3 sec  hold - Wall Push Up  - 1 x daily - 7 x weekly - 1-3 sets - 10 reps - 3 sec  hold - Standing Transverse Abdominis Contraction  - 1 x daily - 7 x weekly - 3 sets - 10 reps - Supine March with Posterior Pelvic Tilt  - 1 x daily - 7 x weekly - 3 sets - 10 reps - Standing Hip Hiking  - 1 x daily - 7 x weekly - 3 sets - 10 reps - Standing Hip Extension with Counter Support  - 1 x daily - 7 x weekly - 1-2 sets - 10 reps - 3-5 sec  hold - Wall Angels  - 1 x daily - 7 x weekly - 1-3 sets - 10 reps - 2-3 sec  hold - Anti-Rotation Lateral Stepping with Press  - 1 x daily - 7 x weekly - 1-2 sets - 10 reps - 2-3 sec  hold  ASSESSMENT:  CLINICAL IMPRESSION:  Patient reports that she is feeling good today. Continued work on strengthening and stabilization focus on core work in standing. Working on walking and gait to decreased limp and improve gait pattern. Experienced episode of feeling faint feeling "woosy headed" and feels hot and sweaty  BP 143/80 HR 82     GOALS: Goals reviewed with patient? Yes  SHORT TERM GOALS: Target date: 10/14/2022  Independent in initial HEP Baseline: Goal status: met  2.  Patient reports/demonstrates ability to stand for 5-10 min for light household and grooming tasks  Baseline:  Goal status: met  3.  Independent in gait with least restrictive assistive device for 500 feet without back spasm  Baseline:  Goal status:  met   LONG TERM GOALS: Target date: 11/25/2022  Increase core strength and stability with patient to demonstrate ability to participate in 20 min of exercise without increase in symptoms  Baseline:  Goal status: on going   2.  4+/5 to 5/5 strength bilat LE's  Baseline:  Goal  status: partially met   3.  Decrease sit to stand times test by 3-5 seconds  Baseline:  Goal status: met  4.  Patient demonstrates/reports ability to ambulate community distances for functional tasks with least restrictive assistive device (at least 1000 feet)  Baseline:  Goal status: on going   5.  Patient reports ability to do laundry at home  Baseline: doing dishes Goal status: partially accomplished  6.  Independent in HEP including aquatic program as indicated  Baseline:  Goal status: on going   7.  Improve functional limitation score to 42  Baseline: 25  10/08/22: 40  Goal status: partially accomplished   PLAN:  PT FREQUENCY: 2x/week  PT DURATION: 12 weeks  PLANNED INTERVENTIONS: Therapeutic exercises, Therapeutic activity, Neuromuscular re-education, Balance training, Gait training, Patient/Family education, Self Care, Joint mobilization, Aquatic Therapy, Dry Needling, Electrical stimulation, Spinal mobilization, Cryotherapy, Moist heat, Taping, Ultrasound, Ionotophoresis 4mg /ml Dexamethasone, Manual therapy, and Re-evaluation.  PLAN FOR NEXT SESSION: No side lying or quadruped (too painful). Lifting mechanics, progress exercises as toerated; continue spine care and body mechanics education; manual work, DN, modalities as indicated. Balance as time allows    P. Leonor Liv PT, MPH 10/16/22 1:11 PM

## 2022-10-20 ENCOUNTER — Ambulatory Visit: Payer: Medicare Other | Admitting: Rehabilitative and Restorative Service Providers"

## 2022-10-20 ENCOUNTER — Encounter: Payer: Self-pay | Admitting: Rehabilitative and Restorative Service Providers"

## 2022-10-20 DIAGNOSIS — M6281 Muscle weakness (generalized): Secondary | ICD-10-CM

## 2022-10-20 DIAGNOSIS — M62830 Muscle spasm of back: Secondary | ICD-10-CM

## 2022-10-20 DIAGNOSIS — R262 Difficulty in walking, not elsewhere classified: Secondary | ICD-10-CM

## 2022-10-20 DIAGNOSIS — G8929 Other chronic pain: Secondary | ICD-10-CM

## 2022-10-20 DIAGNOSIS — M5441 Lumbago with sciatica, right side: Secondary | ICD-10-CM | POA: Diagnosis not present

## 2022-10-20 DIAGNOSIS — R29898 Other symptoms and signs involving the musculoskeletal system: Secondary | ICD-10-CM

## 2022-10-20 NOTE — Therapy (Signed)
OUTPATIENT PHYSICAL THERAPY THORACOLUMBAR TREATMENT    Patient Name: Monique Gonzalez MRN: [id number] DOB:October 26, 1955, 67 y.o., female Today's Date: 10/20/2022  END OF SESSION:  PT End of Session - 10/20/22 1445     Visit Number 13    Number of Visits 24    Date for PT Re-Evaluation 11/25/22    Authorization Type medicare & [employer] of [place]    Progress Note Due on Visit 20    PT Start Time 1445    PT Stop Time 1530    PT Time Calculation (min) 45 min    Activity Tolerance Patient tolerated treatment well              Past Medical History:  Diagnosis Date   Allergy    Anxiety    Phreesia 06/04/2020   Arthritis    Phreesia 07/08/2020   Chronic pain    back and knee   Colitis 09/2012   infectious vs inflammatory.    Colon stricture [facility])    Depression    Depression    Phreesia 06/04/2020   Depression    Phreesia 07/08/2020   GERD (gastroesophageal reflux disease)    Phreesia 06/04/2020   Hyperlipidemia    Obesity    Osteoarthritis    Osteopenia    Recurrent cold sores    Seasonal allergies    Syncope and collapse 11/30/2012   Normal EEG.  Vaso vagal syncope   Past Surgical History:  Procedure Laterality Date   BIOPSY  02/21/2020   Procedure: BIOPSY;  Surgeon: Iva Boop, MD;  Location: [facility] ENDOSCOPY;  Service: Endoscopy;;   COLON RESECTION N/A 10/14/2013   Procedure: LAPAROSCOPIC MOBILIZATION OF SPLENIC FLEXURE, LAPAROSCOPIC EXTENDED LEFT COLECTOMY;  Surgeon: Atilano Ina, MD;  Location: [facility] OR;  Service: General;  Laterality: N/A;   COLON SURGERY N/A    Phreesia 06/04/2020   COLONOSCOPY N/A 10/12/2013   Procedure: COLONOSCOPY;  Surgeon: Beverley Fiedler, MD;  Location: [facility] ENDOSCOPY;  Service: Endoscopy;  Laterality: N/A;   COLONOSCOPY WITH PROPOFOL N/A 02/21/2020   Procedure: COLONOSCOPY WITH PROPOFOL;  Surgeon: Iva Boop, MD;  Location: [facility] ENDOSCOPY;  Service: Endoscopy;  Laterality: N/A;   DILATION AND CURETTAGE, DIAGNOSTIC / THERAPEUTIC  1987    OPEN REDUCTION INTERNAL FIXATION (ORIF) DISTAL RADIAL FRACTURE Right 08/08/2014   Procedure: OPEN REDUCTION INTERNAL FIXATION (ORIF) DISTAL RADIAL FRACTURE;  Surgeon: Dominica Severin, MD;  Location: [facility] OR;  Service: Orthopedics;  Laterality: Right;  DVR Crosslock, MD available sometime around 1430-1500   ORIF ANKLE FRACTURE Right 11/10/2019   Procedure: POSSIBLEOPEN REDUCTION INTERNAL FIXATION (ORIF) ANKLE FRACTURE;  Surgeon: Roby Lofts, MD;  Location: [facility] OR;  Service: Orthopedics;  Laterality: Right;   ORIF WRIST FRACTURE Left 11/10/2019   Procedure: OPEN REDUCTION INTERNAL FIXATION (ORIF) WRIST FRACTURE;  Surgeon: Roby Lofts, MD;  Location: [facility] OR;  Service: Orthopedics;  Laterality: Left;   TONSILLECTOMY     TOTAL KNEE ARTHROPLASTY Right 08/05/2016   Procedure: RIGHT TOTAL KNEE ARTHROPLASTY;  Surgeon: Kathryne Hitch, MD;  Location: [facility] OR;  Service: Orthopedics;  Laterality: Right;   Patient Active Problem List   Diagnosis Date Noted   Lumbar radiculopathy 09/26/2022   Spondylolisthesis of lumbar region 07/22/2022   Status post lumbar spinal fusion 07/22/2022   Ischemic stricture intestine (HCC)    Colitis 02/19/2020   Closed fracture of left distal radius 11/10/2019   Fall 11/10/2019   Closed right ankle fracture 11/09/2019   Ankle fracture 11/09/2019  Closed displaced fracture of styloid process of left ulna    Anxiety and depression 12/22/2016   Insulin resistance 10/15/2016   Presence of right artificial knee joint 10/14/2016   Vitamin D deficiency 04/08/2016   Constipation 09/20/2013   Chronic idiopathic constipation 07/05/2013   Recurrent cold sores    Osteoarthritis    Depression    Hyperlipidemia     PCP: Dr Terance Ice  REFERRING PROVIDER: Dr Lisbeth Renshaw  REFERRING DIAG: lumbar spinal stenosis  Rationale for Evaluation and Treatment: Rehabilitation  THERAPY DIAG:  Chronic bilateral low back pain with right-sided sciatica  Other  symptoms and signs involving the musculoskeletal system  Muscle spasm of back  Difficulty in walking, not elsewhere classified  Muscle weakness (generalized)  ONSET DATE: 07/22/22  SUBJECTIVE:                                                                                                                                                                                           SUBJECTIVE STATEMENT: Patient reports that she has had a couple of episodes fatigue or feeling faint when she was up walking around. She has an MD appointment for evaluation of feeling faint. She is sleeping well and is not having any pain. She is no longer using the lumbar support. She is working on some exercises at home and riding the elliptical bike without difficulty. She can ride the bike for 30 min.  She is still only using the cane when she is out on unfamiliar surfaces or longer periods of time. Decreased tightness in the shoulder area but no back pain. Orthopedist gave her an injection in the L knee and started meloxacam which has helped the knee.   PERTINENT HISTORY:  LBP for 10 years on and off with significant pain in the past 1.5 years; arthritis; R TKA; L knee pain; fx bilat wrists ORIF; bowel resection; idiopathic constipation; depression; obesity  PAIN:  Are you having pain? Yes: NPRS scale: 0/10; this am was 3-4/10 at it's worst  Pain location: lower back; R side of spine  Pain description: aching; burning; muscle  Aggravating factors: walking Relieving factors: sitting; lying in the bed; muscle relaxer   PRECAUTIONS: post op lumbar fusion- to remain in lumbar brace until RTD 10/06/22; out of brace to sleep; no lifting; no bending hands past knees   WEIGHT BEARING RESTRICTIONS: No  FALLS:  Has patient fallen in last 6 months? 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   PATIENT SURVEYS:  FOTO 25; goal 42 10/08/22 - 40    SENSATION: WFL  MUSCLE LENGTH: Hamstrings: Right 75 deg; Left 70 deg Thomas test: tight bilat   POSTURE: rounded shoulders, forward head, and flexed trunk   PALPATION: Muscular tightness noted through hip flexors bilat; Rt thoracolumbar paraspinals  (Patient in lumbar brace limiting palpation)   LUMBAR ROM:   AROM eval 10/08/22  Flexion Fingers to knees  Fingers to ankles   Extension Neutral 20%   Right lateral flexion 60% pulling pain L LB 80%  Left lateral flexion 65% cramping L LB 80%  Right rotation NT 50%  Left rotation NT 50%   (Blank rows = not tested)  LOWER EXTREMITY ROM:  hip mobility WFL's except hip extension which is limited   (ROM generally limited by obesity)  Active  Right eval Left eval  Hip flexion    Hip extension    Hip abduction    Hip adduction    Hip internal rotation    Hip external rotation    Knee flexion    Knee extension    Ankle dorsiflexion    Ankle plantarflexion    Ankle inversion    Ankle eversion     (Blank rows = not tested)  LOWER EXTREMITY MMT:  assessed in sitting and supine - patient unable to lie sidelying or prone     10/08/22: tested in sidelying and supine   MMT Right eval 10/08/22 Left eval 10/08/22  Hip flexion 4 5 4  5-  Hip extension 4- 5- 4- 5-  Hip abduction 4 5- 4 4+  Hip adduction      Hip internal rotation      Hip external rotation      Knee flexion 5  5   Knee extension 5  5   Ankle dorsiflexion      Ankle plantarflexion      Ankle  inversion      Ankle eversion       (Blank rows = not tested)  LUMBAR SPECIAL TESTS:  Straight leg raise test: Negative and Slump test: Negative  FUNCTIONAL TESTS:  5 times sit to stand: 18.46 sec using UE's to move sit to stand; reports cramping R side       10/08/22 - 14.28 sec  GAIT: Distance walked: 40 Assistive device utilized: no assistive Level of assistance: Complete Independence Comments: wide based gait; flexed forward at hips; limp R LE during Wt bearing R    [facility] Adult PT Treatment:                                                DATE: 10/20/22  Therapeutic Exercise: NuStep L7 x 8 min Sitting  Sit to standing 5# wt x 10  Sitting arm angel x 10  Supine  4 part core 10 sec x 10  Shoulder flexion alternating R/L 5# DB each hand  Bridging 3-5 sec x 10  Shoulder flexion 90 deg to overhead holding  one 5# DB in hands x 10  Marching x 10  Shoulder horizontal abduction red TB 3 sec x 10  SLR 3 sec x 10 R/L  Diagonal red TB x 10 R/L  Hip abduction in hooklying alternating R/L green TB 3 sec x 10     Therapeutic Exercise: NuStep L7 x 8 min Row green TB 3 sec x 10 x 2  Shoulder extension green TB 3 sec x 10 x 2  Antirotation green TB 3 sec x 10 R/L Sit to stand x 10  Back to wall - lifting hips from wall, keeping knees straight  5 sec x 10 Hip abduction leading with heel UE support as needed for balance 10 R/L x 2 sets  Standing facing wall hip extension x 10 L/R  Sitting arm angel x 10  Sitting backward shoulder rolls x 10  Sitting lateral cervical flexion 5 sec x 3    [facility] Adult PT Treatment:                                                DATE: 10/13/22  Therapeutic Exercise: NuStep L7 x 8 min Sit to stand x 10 from nustep seat Back to wall - lifting hips from wall, keeping knees straight  3-5 sec x 10 Standing facing wall hip extension x 10 L/R  Standing to work on posture and alignment Walking with step stop pattern to focus on posture, alignment and  balance Hip abduction leading with heel UE support as needed for balance 10 R/L  Heel raises UE support as needed for balance x 10  Side steps at counter focus on feet straight smaller step    PATIENT EDUCATION:  Education details: Updated HEP  Person educated: Patient Education method: Programmer, multimedia, Demonstration, Actor cues, Verbal cues, and Handouts Education comprehension: verbalized understanding, returned demonstration, verbal cues required, tactile cues required, and needs further education  HOME EXERCISE PROGRAM:  Access Code: [email or web address] URL: [email or web address] Date: 10/16/2022 Prepared by: Corlis Leak  Exercises - Supine Posterior Pelvic Tilt  - 1 x daily - 7 x weekly - 3 sets - 10 reps - Supine Single Knee to Chest Stretch  - 1 x daily - 7 x weekly - 1 sets - 3-5 reps - 10-30 sec hold - Supine Hip Adduction Isometric with Ball  - 1 x daily - 7 x weekly - 1 sets - 5 reps - Supine Bridge with Mini Swiss Ball Between Knees  - 1 x daily - 7 x weekly - 3 sets - 10 reps - 3 sec hold - Supine Hamstring Stretch with Strap  - 1 x daily - 7 x weekly - 1 sets - 3-5 reps - 30 sec hold - Supine ITB Stretch with Strap  - 1 x daily - 7 x weekly - 1 sets - 3-5 reps - 30 sec hold - Seated Quadratus Lumborum Stretch with Forward Bend  - 1 x daily - 7 x weekly - 2 sets - 10 reps - 5-10 sec hold - Seated Flexion Stretch with Swiss Ball  - 1 x daily - 7 x weekly - 2 sets - 10 reps - 5-10 sec hold - Seated Single Leg Hip Abduction with Resistance  - 1 x daily - 7 x weekly - 3 sets - 10 reps - Side Stepping with Resistance at Ankles and Counter Support  -  1 x daily - 7 x weekly - 3 sets - 10 reps - Squat with Chair Touch  - 1 x daily - 7 x weekly - 3 sets - 10 reps - Supine Piriformis Stretch with Leg Straight  - 1 x daily - 7 x weekly - 1 sets - 3 reps - 30 sec  hold - Hooklying Isometric Clamshell  - 1 x daily - 7 x weekly - 1 sets - 10 reps - 3 sec  hold - Standing Bilateral Low  Shoulder Row with Anchored Resistance  - 2 x daily - 7 x weekly - 1-3 sets - 10 reps - 2-3 sec  hold - Shoulder extension with resistance - Neutral  - 1 x daily - 7 x weekly - 1-2 sets - 10 reps - 3-5 sec  hold - Hooklying Shoulder I  - 1 x daily - 7 x weekly - 1 sets - 10 reps - 3 sec  hold - Shoulder Flexion Wall Slide with Towel  - 1 x daily - 7 x weekly - 1-2 sets - 10 reps - 3 sec  hold - Wall Push Up  - 1 x daily - 7 x weekly - 1-3 sets - 10 reps - 3 sec  hold - Standing Transverse Abdominis Contraction  - 1 x daily - 7 x weekly - 3 sets - 10 reps - Supine March with Posterior Pelvic Tilt  - 1 x daily - 7 x weekly - 3 sets - 10 reps - Standing Hip Hiking  - 1 x daily - 7 x weekly - 3 sets - 10 reps - Standing Hip Extension with Counter Support  - 1 x daily - 7 x weekly - 1-2 sets - 10 reps - 3-5 sec  hold - Wall Angels  - 1 x daily - 7 x weekly - 1-3 sets - 10 reps - 2-3 sec  hold - Anti-Rotation Lateral Stepping with Press  - 1 x daily - 7 x weekly - 1-2 sets - 10 reps - 2-3 sec  hold  ASSESSMENT:  CLINICAL IMPRESSION:  Patient reports that she has continued to have episodes of feeling faint. This has happened twice since she was in PT last week. Loreane has an MD appointment for tomorrow for further evaluation of episodes of dizziness. Worked primarily in sitting and supine today with no episodes of dizziness or feeling faint reported. Continued work on strengthening and stabilization focus on core work. Will adjust exercises as indicated following MD evaluation of dizziness.   Supine HR 84; BP 111/80 Sitting HR 93 BP 114/76 Standing 108 BP 113/68  GOALS: Goals reviewed with patient? Yes  SHORT TERM GOALS: Target date: 10/14/2022  Independent in initial HEP Baseline: Goal status: met  2.  Patient reports/demonstrates ability to stand for 5-10 min for light household and grooming tasks  Baseline:  Goal status: met  3.  Independent in gait with least restrictive assistive device  for 500 feet without back spasm  Baseline:  Goal status: met   LONG TERM GOALS: Target date: 11/25/2022  Increase core strength and stability with patient to demonstrate ability to participate in 20 min of exercise without increase in symptoms  Baseline:  Goal status: on going   2.  4+/5 to 5/5 strength bilat LE's  Baseline:  Goal status: partially met   3.  Decrease sit to stand times test by 3-5 seconds  Baseline:  Goal status: met  4.  Patient demonstrates/reports ability to ambulate community  distances for functional tasks with least restrictive assistive device (at least 1000 feet)  Baseline:  Goal status: on going   5.  Patient reports ability to do laundry at home  Baseline: doing dishes Goal status: partially accomplished  6.  Independent in HEP including aquatic program as indicated  Baseline:  Goal status: on going   7.  Improve functional limitation score to 42  Baseline: 25  10/08/22: 40  Goal status: partially accomplished   PLAN:  PT FREQUENCY: 2x/week  PT DURATION: 12 weeks  PLANNED INTERVENTIONS: Therapeutic exercises, Therapeutic activity, Neuromuscular re-education, Balance training, Gait training, Patient/Family education, Self Care, Joint mobilization, Aquatic Therapy, Dry Needling, Electrical stimulation, Spinal mobilization, Cryotherapy, Moist heat, Taping, Ultrasound, Ionotophoresis 4mg /ml Dexamethasone, Manual therapy, and Re-evaluation.  PLAN FOR NEXT SESSION: No side lying or quadruped (too painful). Lifting mechanics, progress exercises as toerated; continue spine care and body mechanics education; manual work, DN, modalities as indicated. Balance as time allows    P. Leonor Liv PT, MPH 10/20/22 2:52 PM

## 2022-10-21 ENCOUNTER — Ambulatory Visit (INDEPENDENT_AMBULATORY_CARE_PROVIDER_SITE_OTHER): Payer: Medicare Other | Admitting: Family Medicine

## 2022-10-21 ENCOUNTER — Encounter: Payer: Self-pay | Admitting: Family Medicine

## 2022-10-21 VITALS — HR 79 | Temp 98.7°F | Ht 70.0 in | Wt 288.0 lb

## 2022-10-21 DIAGNOSIS — R55 Syncope and collapse: Secondary | ICD-10-CM | POA: Diagnosis not present

## 2022-10-21 LAB — CBC WITH DIFFERENTIAL/PLATELET
Absolute Monocytes: 680 cells/uL (ref 200–950)
Basophils Absolute: 34 cells/uL (ref 0–200)
Basophils Relative: 0.4 %
Eosinophils Absolute: 17 cells/uL (ref 15–500)
Eosinophils Relative: 0.2 %
HCT: 35.6 % (ref 35.0–45.0)
Hemoglobin: 11.8 g/dL (ref 11.7–15.5)
Lymphs Abs: 1730 cells/uL (ref 850–3900)
MCH: 28.9 pg (ref 27.0–33.0)
MCHC: 33.1 g/dL (ref 32.0–36.0)
MCV: 87 fL (ref 80.0–100.0)
MPV: 9.8 fL (ref 7.5–12.5)
Monocytes Relative: 8.1 %
Neutro Abs: 5939 cells/uL (ref 1500–7800)
Neutrophils Relative %: 70.7 %
Platelets: 257 10*3/uL (ref 140–400)
RBC: 4.09 10*6/uL (ref 3.80–5.10)
RDW: 14 % (ref 11.0–15.0)
Total Lymphocyte: 20.6 %
WBC: 8.4 10*3/uL (ref 3.8–10.8)

## 2022-10-21 NOTE — Progress Notes (Signed)
Subjective:    [name], female    DOB: May 11, 1955, 67 y.o.   MRN: [id number]  Dizziness  Patient is a very pleasant 66 year old Caucasian female who presents today with lightheadedness.  She has a history of vasovagal episodes of syncope related to straining with defecation.  Recently in therapy, she has become extremely lightheaded when hide.  She feels hot all over and then feels dizzy like she may pass out.  She has time to sit down and the symptoms resolved after she resolved.  She denies any tachycardia.  She denies any chest pain or trouble breathing.  She also recently had an event that occurred while at [employer].  She was standing in line.  She was very hot.  At that moment she became very hot and sweaty and then felt lightheaded like she needed to sit down so she would pass out.  All episodes sound vasovagal.  Until the episode at the grocery store they were brought on by exercise.  This event occurred unprovoked although it may have occurred due to.  She denies any angina, dyspnea on exertion, orthopnea.  She has trace pitting edema in her extremities but no obvious signs of heart failure. Past Medical History:  Diagnosis Date   Allergy    Anxiety    Phreesia 06/04/2020   Arthritis    Phreesia 07/08/2020   Chronic pain    back and knee   Colitis 09/2012   infectious vs inflammatory.    Colon stricture [facility])    Depression    Depression    Phreesia 06/04/2020   Depression    Phreesia 07/08/2020   GERD (gastroesophageal reflux disease)    Phreesia 06/04/2020   Hyperlipidemia    Obesity    Osteoarthritis    Osteopenia    Recurrent cold sores    Seasonal allergies    Syncope and collapse 11/30/2012   Normal EEG.  Vaso vagal syncope   Past Surgical History:  Procedure Laterality Date   BIOPSY  02/21/2020   Procedure: BIOPSY;  Surgeon: Iva Boop, MD;  Location: [facility] ENDOSCOPY;  Service: Endoscopy;;   COLON RESECTION N/A 10/14/2013   Procedure:  LAPAROSCOPIC MOBILIZATION OF SPLENIC FLEXURE, LAPAROSCOPIC EXTENDED LEFT COLECTOMY;  Surgeon: Atilano Ina, MD;  Location: [facility] OR;  Service: General;  Laterality: N/A;   COLON SURGERY N/A    Phreesia 06/04/2020   COLONOSCOPY N/A 10/12/2013   Procedure: COLONOSCOPY;  Surgeon: Beverley Fiedler, MD;  Location: [facility] ENDOSCOPY;  Service: Endoscopy;  Laterality: N/A;   COLONOSCOPY WITH PROPOFOL N/A 02/21/2020   Procedure: COLONOSCOPY WITH PROPOFOL;  Surgeon: Iva Boop, MD;  Location[facility] ENDOSCOPY;  Service: Endoscopy;  Laterality: N/A;   DILATION AND CURETTAGE, DIAGNOSTIC / THERAPEUTIC  1987   OPEN REDUCTION INTERNAL FIXATION (ORIF) DISTAL RADIAL FRACTURE Right 08/08/2014   Procedure: OPEN REDUCTION INTERNAL FIXATION (ORIF) DISTAL RADIAL FRACTURE;  Surgeon: Dominica Severin, MD;  Location: [facility] OR;  Service: Orthopedics;  Laterality: Right;  DVR Crosslock, MD available sometime around 1430-1500   ORIF ANKLE FRACTURE Right 11/10/2019   Procedure: POSSIBLEOPEN REDUCTION INTERNAL FIXATION (ORIF) ANKLE FRACTURE;  Surgeon: Roby Lofts, MD;  Location: [facility] OR;  Service: Orthopedics;  Laterality: Right;   ORIF WRIST FRACTURE Left 11/10/2019   Procedure: OPEN REDUCTION INTERNAL FIXATION (ORIF) WRIST FRACTURE;  Surgeon: Roby Lofts, MD;  Location: [facility] OR;  Service: Orthopedics;  Laterality: Left;   TONSILLECTOMY     TOTAL KNEE ARTHROPLASTY Right 08/05/2016  Procedure: RIGHT TOTAL KNEE ARTHROPLASTY;  Surgeon: Kathryne Hitch, MD;  Location: [facility] OR;  Service: Orthopedics;  Laterality: Right;   Current Outpatient Medications on File Prior to Visit  Medication Sig Dispense Refill   acetaminophen (TYLENOL) 650 MG CR tablet Take 1,300 mg by mouth at bedtime.     atorvastatin (LIPITOR) 40 MG tablet TAKE 1 TABLET EVERY DAY 90 tablet 3   b complex vitamins capsule Take 1 capsule by mouth daily.     Calcium Carb-Cholecalciferol (CALCIUM 600+D3 PO) Take 1 tablet by mouth in the morning and at bedtime.      Cholecalciferol (VITAMIN D-3) 25 MCG (1000 UT) CAPS Take 1,000 Units by mouth daily with breakfast.     cyclobenzaprine (FLEXERIL) 10 MG tablet Take 1 tablet (10 mg total) by mouth 3 (three) times daily as needed for muscle spasms. 30 tablet 3   docusate sodium (COLACE) 100 MG capsule Take 1 capsule (100 mg total) by mouth 2 (two) times daily. 60 capsule 0   fluticasone (FLONASE) 50 MCG/ACT nasal spray Place 2 sprays into both nostrils at bedtime.     linaclotide (LINZESS) 290 MCG CAPS capsule Take 1 capsule (290 mcg total) by mouth daily 30 minutes before breakfast 30 capsule 3   loratadine (CLARITIN) 10 MG tablet Take 10 mg by mouth at bedtime.     Melatonin 10 MG TABS Take 10 mg by mouth at bedtime.     meloxicam (MOBIC) 15 MG tablet Take 1 tablet (15 mg total) by mouth daily. 90 tablet 3   Multiple Vitamins-Minerals (ONE-A-DAY WOMENS PO) Take 1 tablet by mouth daily with breakfast.     Omega-3 Fatty Acids (FISH OIL) 1000 MG CAPS Take 1,000 mg by mouth 2 (two) times daily.     pantoprazole (PROTONIX) 40 MG tablet TAKE 1 TABLET EVERY DAY 90 tablet 3   polyethylene glycol (MIRALAX / GLYCOLAX) 17 g packet Take 17 g by mouth daily.     venlafaxine XR (EFFEXOR-XR) 75 MG 24 hr capsule TAKE 2 CAPSULES EVERY DAY WITH BREAKFAST 180 capsule 3   Wheat Dextrin (BENEFIBER) POWD Take 1 Dose by mouth daily. 2 teaspoons     No current facility-administered medications on file prior to visit.   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    Employer: [employer]    Comment: LPN  Tobacco Use   Smoking status:  Former    Current packs/day: 0.00    Types: Cigarettes    Quit date: 10/27/1993    Years since quitting: 29.0   Smokeless tobacco: Never  Vaping Use   Vaping status: Never Used  Substance and Sexual Activity   Alcohol use: Yes    Comment: one drink per week   Drug use: No   Sexual activity: Not Currently  Other Topics Concern   Not on file  Social History Narrative   Not on file   Social Determinants of Health   Financial Resource Strain: Low Risk  (09/22/2022)   Overall Financial Resource Strain (CARDIA)    Difficulty of Paying Living Expenses: Not very hard  Food Insecurity: No Food Insecurity (09/22/2022)   Hunger Vital Sign    Worried About Running Out of Food in the Last Year: Never true    Ran Out of Food in the Last Year: Never true  Transportation Needs: No Transportation Needs (09/22/2022)   PRAPARE - Administrator, Civil Service (Medical): No    Lack of Transportation (Non-Medical): No  Physical Activity: Sufficiently Active (09/22/2022)   Exercise Vital Sign    Days of Exercise per Week: 7 days    Minutes of Exercise per Session: 40 min  Recent Concern: Physical Activity - Inactive (07/10/2022)   Exercise Vital Sign    Days of Exercise per Week: 0 days    Minutes of Exercise per Session: 0 min  Stress: No Stress Concern Present (09/22/2022)   [employer] of Occupational Health - Occupational Stress Questionnaire    Feeling of Stress : Only a little  Recent Concern: Stress - Stress Concern Present (07/10/2022)   [employer] of Occupational Health - Occupational Stress Questionnaire    Feeling of Stress : Rather much  Social Connections: Unknown (09/22/2022)   Social Connection and Isolation Panel [NHANES]    Frequency of Communication with Friends and Family: More than three times a week    Frequency of Social Gatherings with Friends and Family: Patient declined    Attends Religious Services: Patient declined    Database administrator or  Organizations: No    Attends Banker Meetings: Never    Marital Status: Divorced  Catering manager Violence: Not At Risk (07/10/2022)   Humiliation, Afraid, Rape, and Kick questionnaire    Fear of Current or Ex-Partner: No    Emotionally Abused: No    Physically Abused: No    Sexually Abused: No   Family History  Problem Relation Age of Onset   Stroke Mother    Arthritis Mother    Thyroid disease Mother    Cancer Father    Arthritis Sister    Thyroid disease Sister    Cancer Maternal Grandmother    Stroke Maternal Grandfather    Cancer Maternal Grandfather    Colon cancer Maternal Grandfather    Cancer Paternal Grandmother    Heart disease Paternal Grandmother    Colon cancer Paternal Grandmother    Stroke Paternal Grandfather    Arthritis Sister    Obesity Sister    Sudden death Neg Hx    Hypertension Neg Hx    Hyperlipidemia Neg Hx    Heart attack Neg Hx    Diabetes Neg Hx    Rectal cancer Neg Hx    Stomach cancer Neg Hx    Esophageal cancer Neg Hx       Review of Systems  Neurological:  Positive for dizziness.  All other systems reviewed and are negative.      Objective:   Physical Exam Vitals reviewed.  Constitutional:      General: She is not in acute distress.    Appearance: She is obese. She is not ill-appearing, toxic-appearing or diaphoretic.  HENT:     Head: Normocephalic and atraumatic.     Right Ear: Tympanic membrane, ear canal and external ear normal. There is no impacted cerumen.     Left Ear: Tympanic membrane, ear canal and external ear normal. There is no impacted cerumen.     Nose: Nose normal. No congestion or rhinorrhea.     Mouth/Throat:     Mouth: Mucous membranes are moist.  Pharynx: Oropharynx is clear. No oropharyngeal exudate or posterior oropharyngeal erythema.  Eyes:     General: No scleral icterus.       Right eye: No discharge.        Left eye: No discharge.     Extraocular Movements: Extraocular movements  intact.     Conjunctiva/sclera: Conjunctivae normal.     Pupils: Pupils are equal, round, and reactive to light.  Neck:     Vascular: No carotid bruit.  Cardiovascular:     Rate and Rhythm: Normal rate and regular rhythm.     Pulses: Normal pulses.     Heart sounds: Normal heart sounds. No murmur heard.    No friction rub. No gallop.  Pulmonary:     Effort: Pulmonary effort is normal. No respiratory distress.     Breath sounds: Normal breath sounds. No stridor. No wheezing, rhonchi or rales.  Chest:     Chest wall: No tenderness.  Abdominal:     General: Bowel sounds are normal. There is no distension.     Palpations: Abdomen is soft. There is no mass.     Tenderness: There is no abdominal tenderness. There is no right CVA tenderness, left CVA tenderness, guarding or rebound.     Hernia: No hernia is present.  Musculoskeletal:     Cervical back: Normal range of motion. No muscular tenderness.     Right lower leg: No edema.     Left lower leg: No edema.  Lymphadenopathy:     Cervical: No cervical adenopathy.  Skin:    General: Skin is warm.     Coloration: Skin is not jaundiced or pale.     Findings: No bruising, erythema, lesion or rash.  Neurological:     General: No focal deficit present.     Mental Status: She is alert and oriented to person, place, and time. Mental status is at baseline.     Cranial Nerves: No cranial nerve deficit.  Psychiatric:        Mood and Affect: Mood normal.        Behavior: Behavior normal.        Thought Content: Thought content normal.        Judgment: Judgment normal.           Assessment & Plan:  Near syncope - Plan: Ambulatory referral to Cardiology, CBC with Differential/Platelet, COMPLETE METABOLIC PANEL WITH GFR I believe the patient is having vasovagal events brought on by heat, prolonged standing, exercise.  She has a history of these with straining with defecation.  However I would like the patient to see cardiology for an event  monitor to rule out any cardiac arrhythmias.  She would also benefit from an echocardiogram to rule out any evidence of congestive heart failure.  I do not appreciate a murmur today on exam to suggest valvular disease.

## 2022-10-23 ENCOUNTER — Encounter: Payer: Self-pay | Admitting: Rehabilitative and Restorative Service Providers"

## 2022-10-23 ENCOUNTER — Ambulatory Visit: Payer: Medicare Other | Admitting: Rehabilitative and Restorative Service Providers"

## 2022-10-23 DIAGNOSIS — R29898 Other symptoms and signs involving the musculoskeletal system: Secondary | ICD-10-CM

## 2022-10-23 DIAGNOSIS — G8929 Other chronic pain: Secondary | ICD-10-CM

## 2022-10-23 DIAGNOSIS — M62830 Muscle spasm of back: Secondary | ICD-10-CM

## 2022-10-23 DIAGNOSIS — M5441 Lumbago with sciatica, right side: Secondary | ICD-10-CM | POA: Diagnosis not present

## 2022-10-23 NOTE — Therapy (Signed)
OUTPATIENT PHYSICAL THERAPY THORACOLUMBAR TREATMENT    Patient Name: Monique Gonzalez MRN: [id number] DOB:05-Jul-1955, 67 y.o., female Today's Date: 10/23/2022  END OF SESSION:  PT End of Session - 10/23/22 1315     Visit Number 14    Number of Visits 24    Date for PT Re-Evaluation 11/25/22    Authorization Type medicare & [employer] of [place]    Progress Note Due on Visit 20    PT Start Time 1311    PT Stop Time 1400    PT Time Calculation (min) 49 min    Activity Tolerance Patient tolerated treatment well              Past Medical History:  Diagnosis Date   Allergy    Anxiety    Phreesia 06/04/2020   Arthritis    Phreesia 07/08/2020   Chronic pain    back and knee   Colitis 09/2012   infectious vs inflammatory.    Colon stricture [facility])    Depression    Depression    Phreesia 06/04/2020   Depression    Phreesia 07/08/2020   GERD (gastroesophageal reflux disease)    Phreesia 06/04/2020   Hyperlipidemia    Obesity    Osteoarthritis    Osteopenia    Recurrent cold sores    Seasonal allergies    Syncope and collapse 11/30/2012   Normal EEG.  Vaso vagal syncope   Past Surgical History:  Procedure Laterality Date   BIOPSY  02/21/2020   Procedure: BIOPSY;  Surgeon: Iva Boop, MD;  Location: [facility] ENDOSCOPY;  Service: Endoscopy;;   COLON RESECTION N/A 10/14/2013   Procedure: LAPAROSCOPIC MOBILIZATION OF SPLENIC FLEXURE, LAPAROSCOPIC EXTENDED LEFT COLECTOMY;  Surgeon: Atilano Ina, MD;  Location: [facility] OR;  Service: General;  Laterality: N/A;   COLON SURGERY N/A    Phreesia 06/04/2020   COLONOSCOPY N/A 10/12/2013   Procedure: COLONOSCOPY;  Surgeon: Beverley Fiedler, MD;  Location: [facility] ENDOSCOPY;  Service: Endoscopy;  Laterality: N/A;   COLONOSCOPY WITH PROPOFOL N/A 02/21/2020   Procedure: COLONOSCOPY WITH PROPOFOL;  Surgeon: Iva Boop, MD;  Location: [facility] ENDOSCOPY;  Service: Endoscopy;  Laterality: N/A;   DILATION AND CURETTAGE, DIAGNOSTIC / THERAPEUTIC  1987    OPEN REDUCTION INTERNAL FIXATION (ORIF) DISTAL RADIAL FRACTURE Right 08/08/2014   Procedure: OPEN REDUCTION INTERNAL FIXATION (ORIF) DISTAL RADIAL FRACTURE;  Surgeon: Dominica Severin, MD;  Location: [facility] OR;  Service: Orthopedics;  Laterality: Right;  DVR Crosslock, MD available sometime around 1430-1500   ORIF ANKLE FRACTURE Right 11/10/2019   Procedure: POSSIBLEOPEN REDUCTION INTERNAL FIXATION (ORIF) ANKLE FRACTURE;  Surgeon: Roby Lofts, MD;  Location: [facility] OR;  Service: Orthopedics;  Laterality: Right;   ORIF WRIST FRACTURE Left 11/10/2019   Procedure: OPEN REDUCTION INTERNAL FIXATION (ORIF) WRIST FRACTURE;  Surgeon: Roby Lofts, MD;  Location: [facility] OR;  Service: Orthopedics;  Laterality: Left;   TONSILLECTOMY     TOTAL KNEE ARTHROPLASTY Right 08/05/2016   Procedure: RIGHT TOTAL KNEE ARTHROPLASTY;  Surgeon: Kathryne Hitch, MD;  Location: [facility] OR;  Service: Orthopedics;  Laterality: Right;   Patient Active Problem List   Diagnosis Date Noted   Lumbar radiculopathy 09/26/2022   Spondylolisthesis of lumbar region 07/22/2022   Status post lumbar spinal fusion 07/22/2022   Ischemic stricture intestine (HCC)    Colitis 02/19/2020   Closed fracture of left distal radius 11/10/2019   Fall 11/10/2019   Closed right ankle fracture 11/09/2019   Ankle fracture 11/09/2019  Closed displaced fracture of styloid process of left ulna    Anxiety and depression 12/22/2016   Insulin resistance 10/15/2016   Presence of right artificial knee joint 10/14/2016   Vitamin D deficiency 04/08/2016   Constipation 09/20/2013   Chronic idiopathic constipation 07/05/2013   Recurrent cold sores    Osteoarthritis    Depression    Hyperlipidemia     PCP: Dr Terance Ice  REFERRING PROVIDER: Dr Lisbeth Renshaw  REFERRING DIAG: lumbar spinal stenosis  Rationale for Evaluation and Treatment: Rehabilitation  THERAPY DIAG:  Chronic bilateral low back pain with right-sided sciatica  Other  symptoms and signs involving the musculoskeletal system  Muscle spasm of back  ONSET DATE: 07/22/22  SUBJECTIVE:                                                                                                                                                                                           SUBJECTIVE STATEMENT: Patient reports that she saw her MD this week re- episodes fatigue or feeling faint. He thinks she is having a vasovagal response when she gets hot. He is going to send her to a cardiologist but he feels the systems are not heart related. Patient has a cool wrap on her head today to prevent her from overheating. She is no longer using the lumbar support. She is working on some exercises at home and riding the elliptical bike without difficulty. She can ride the bike for 30 min. She is still only using the cane when she is out on unfamiliar surfaces or longer periods of time. Orthopedist gave her an injection in the L knee and started meloxacam which has helped the knee.   PERTINENT HISTORY:  LBP for 10 years on and off with significant pain in the past 1.5 years; arthritis; R TKA; L knee pain; fx bilat wrists ORIF; bowel resection; idiopathic constipation; depression; obesity  PAIN:  Are you having pain? Yes: NPRS scale: 0/10  Pain location: lower back; R side of spine  Pain description: aching; burning; muscle  Aggravating factors: walking Relieving factors: sitting; lying in the bed; muscle relaxer   PRECAUTIONS: post op lumbar fusion- to remain in lumbar brace until RTD 10/06/22; out of brace to sleep; no lifting; no bending hands past knees   WEIGHT BEARING RESTRICTIONS: No  FALLS:  Has patient fallen in last 6 months? 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   PATIENT SURVEYS:  FOTO 25; goal 42 10/08/22 - 40    SENSATION: WFL  MUSCLE LENGTH: Hamstrings: Right 75 deg; Left 70 deg Thomas test: tight bilat   POSTURE: rounded shoulders, forward head, and flexed trunk   PALPATION: Muscular tightness noted through hip flexors bilat; Rt thoracolumbar paraspinals  (Patient in lumbar brace limiting palpation)   LUMBAR ROM:   AROM eval 10/08/22  Flexion Fingers to knees  Fingers to ankles   Extension Neutral 20%   Right lateral flexion 60% pulling pain L LB 80%  Left lateral flexion 65% cramping L LB 80%  Right rotation NT 50%  Left rotation NT 50%   (Blank rows = not tested)  LOWER EXTREMITY ROM:  hip mobility WFL's except hip extension which is limited   (ROM generally limited by obesity)  Active  Right eval Left eval  Hip flexion    Hip extension    Hip abduction    Hip adduction    Hip internal rotation    Hip external rotation    Knee flexion    Knee extension    Ankle dorsiflexion    Ankle plantarflexion    Ankle inversion    Ankle eversion     (Blank rows = not tested)  LOWER EXTREMITY MMT:  assessed in sitting and supine - patient unable to lie sidelying or prone     10/08/22: tested in sidelying and supine   MMT Right eval 10/08/22 Left eval 10/08/22  Hip flexion 4 5 4  5-  Hip extension 4- 5- 4- 5-  Hip abduction 4 5- 4 4+  Hip adduction      Hip internal rotation      Hip external rotation      Knee flexion 5  5   Knee extension 5  5   Ankle dorsiflexion      Ankle plantarflexion      Ankle inversion      Ankle eversion       (Blank rows = not tested)  LUMBAR SPECIAL TESTS:   Straight leg raise test: Negative and Slump test: Negative  FUNCTIONAL TESTS:  5 times sit to stand: 18.46 sec using UE's to move sit to stand; reports cramping R side       10/08/22 - 14.28 sec  GAIT: Distance walked: 40 Assistive device utilized: no assistive Level of assistance: Complete Independence Comments: wide based gait; flexed forward at hips; limp R LE during Wt bearing R   [facility] Adult PT Treatment:                                                DATE: 10/23/22 Therapeutic Exercise: NuStep L7 x 7 min Standing  Row green TB 3 sec x 10 x 3  Shoulder extension green TB 3 sec x 10 x 3  Antirotation green TB R/L 3 sec x 10 x 2 Supine  Alternating hip abduction green TB R/L 3 sec x 20 Bridging 3-5 sec x 10 x 2 Hip adduction w/ball squeeze 3 sec x 10  x 2 Bilat shoulder horizontal abduction red TB 3 sec x 10 x 2  Diagonal red TB R/L x 10 x 2 Sitting Sit to stand holding 5# wt x 10  Sitting arm angel x 10  Sitting backward shoulder rolls x 10  Sitting lateral cervical flexion 5 sec x 3  Gait  Walking step stop to work on gait pattern and balance  Backward walking    Will add back at next visit: Back to wall - lifting hips from wall, keeping knees straight  5 sec x 10 Hip abduction leading with heel UE support as needed for balance 10 R/L x 2 sets  Standing facing wall hip extension x 10 L/R    [facility] Adult PT Treatment:                                                DATE: 10/20/22  Therapeutic Exercise: NuStep L7 x 8 min Sitting  Sit to standing 5# wt x 10  Sitting arm angel x 10  Supine  4 part core 10 sec x 10  Shoulder flexion alternating R/L 5# DB each hand  Bridging 3-5 sec x 10  Shoulder flexion 90 deg to overhead holding one 5# DB in hands x 10  Marching x 10  Shoulder horizontal abduction red TB 3 sec x 10  SLR 3 sec x 10 R/L  Diagonal red TB x 10 R/L  Hip abduction in hooklying alternating R/L green TB 3 sec x 10    PATIENT EDUCATION:  Education  details: Updated HEP  Person educated: Patient Education method: Explanation, Demonstration, Tactile cues, Verbal cues, and Handouts Education comprehension: verbalized understanding, returned demonstration, verbal cues required, tactile cues required, and needs further education  HOME EXERCISE PROGRAM:  Access Code: [email or web address] URL: [email or web address] Date: 10/16/2022 Prepared by: Corlis Leak  Exercises - Supine Posterior Pelvic Tilt  - 1 x daily - 7 x weekly - 3 sets - 10 reps - Supine Single Knee to Chest Stretch  - 1 x daily - 7 x weekly - 1 sets - 3-5 reps - 10-30 sec hold - Supine Hip Adduction Isometric with Ball  - 1 x daily - 7 x weekly - 1 sets - 5 reps - Supine Bridge with Mini Swiss Ball Between Knees  - 1 x daily - 7 x weekly - 3 sets - 10 reps - 3 sec hold - Supine Hamstring Stretch with Strap  - 1 x daily - 7 x weekly - 1 sets - 3-5 reps - 30 sec hold - Supine ITB Stretch with Strap  - 1 x daily - 7 x weekly - 1 sets - 3-5 reps - 30 sec hold - Seated Quadratus Lumborum Stretch with Forward Bend  - 1 x daily - 7 x weekly - 2 sets - 10 reps - 5-10 sec hold - Seated Flexion Stretch with Swiss Ball  - 1 x daily - 7 x weekly - 2 sets - 10 reps - 5-10 sec hold - Seated Single Leg Hip Abduction with Resistance  - 1 x daily - 7 x weekly - 3 sets - 10 reps - Side Stepping with Resistance at Ankles and Counter Support  - 1 x daily - 7 x weekly - 3 sets - 10 reps - Squat with Chair Touch  - 1 x daily - 7 x weekly -  3 sets - 10 reps - Supine Piriformis Stretch with Leg Straight  - 1 x daily - 7 x weekly - 1 sets - 3 reps - 30 sec  hold - Hooklying Isometric Clamshell  - 1 x daily - 7 x weekly - 1 sets - 10 reps - 3 sec  hold - Standing Bilateral Low Shoulder Row with Anchored Resistance  - 2 x daily - 7 x weekly - 1-3 sets - 10 reps - 2-3 sec  hold - Shoulder extension with resistance - Neutral  - 1 x daily - 7 x weekly - 1-2 sets - 10 reps - 3-5 sec  hold - Hooklying  Shoulder I  - 1 x daily - 7 x weekly - 1 sets - 10 reps - 3 sec  hold - Shoulder Flexion Wall Slide with Towel  - 1 x daily - 7 x weekly - 1-2 sets - 10 reps - 3 sec  hold - Wall Push Up  - 1 x daily - 7 x weekly - 1-3 sets - 10 reps - 3 sec  hold - Standing Transverse Abdominis Contraction  - 1 x daily - 7 x weekly - 3 sets - 10 reps - Supine March with Posterior Pelvic Tilt  - 1 x daily - 7 x weekly - 3 sets - 10 reps - Standing Hip Hiking  - 1 x daily - 7 x weekly - 3 sets - 10 reps - Standing Hip Extension with Counter Support  - 1 x daily - 7 x weekly - 1-2 sets - 10 reps - 3-5 sec  hold - Wall Angels  - 1 x daily - 7 x weekly - 1-3 sets - 10 reps - 2-3 sec  hold - Anti-Rotation Lateral Stepping with Press  - 1 x daily - 7 x weekly - 1-2 sets - 10 reps - 2-3 sec  hold  ASSESSMENT:  CLINICAL IMPRESSION:  Patient was seen by MD who feels the faint feeling Shante is experiencing is a vasovagal response to becoming too hot with exercises or activities. She is OK to continue with her exercise program. Patient has a cool wrap on her head for exercises today and feels that will help with exercise tolerance. Continued work with core stabilization and strengthening. Patient tolerated more exercises in standing today. Continued exercises today without an episode of feeling faint.    GOALS: Goals reviewed with patient? Yes  SHORT TERM GOALS: Target date: 10/14/2022  Independent in initial HEP Baseline: Goal status: met  2.  Patient reports/demonstrates ability to stand for 5-10 min for light household and grooming tasks  Baseline:  Goal status: met  3.  Independent in gait with least restrictive assistive device for 500 feet without back spasm  Baseline:  Goal status: met   LONG TERM GOALS: Target date: 11/25/2022  Increase core strength and stability with patient to demonstrate ability to participate in 20 min of exercise without increase in symptoms  Baseline:  Goal status: on going    2.  4+/5 to 5/5 strength bilat LE's  Baseline:  Goal status: partially met   3.  Decrease sit to stand times test by 3-5 seconds  Baseline:  Goal status: met  4.  Patient demonstrates/reports ability to ambulate community distances for functional tasks with least restrictive assistive device (at least 1000 feet)  Baseline:  Goal status: on going   5.  Patient reports ability to do laundry at home  Baseline: doing dishes Goal status: partially  accomplished  6.  Independent in HEP including aquatic program as indicated  Baseline:  Goal status: on going   7.  Improve functional limitation score to 42  Baseline: 25  10/08/22: 40  Goal status: partially accomplished   PLAN:  PT FREQUENCY: 2x/week  PT DURATION: 12 weeks  PLANNED INTERVENTIONS: Therapeutic exercises, Therapeutic activity, Neuromuscular re-education, Balance training, Gait training, Patient/Family education, Self Care, Joint mobilization, Aquatic Therapy, Dry Needling, Electrical stimulation, Spinal mobilization, Cryotherapy, Moist heat, Taping, Ultrasound, Ionotophoresis 4mg /ml Dexamethasone, Manual therapy, and Re-evaluation.  PLAN FOR NEXT SESSION: No side lying or quadruped (too painful). Lifting mechanics, progress exercises as toerated; continue spine care and body mechanics education; manual work, DN, modalities as indicated. Balance as time allows    P. Leonor Liv PT, MPH 10/23/22 1:16 PM

## 2022-10-27 ENCOUNTER — Ambulatory Visit: Payer: Medicare Other | Admitting: Rehabilitative and Restorative Service Providers"

## 2022-10-27 ENCOUNTER — Encounter: Payer: Self-pay | Admitting: Family Medicine

## 2022-10-27 ENCOUNTER — Encounter: Payer: Self-pay | Admitting: Rehabilitative and Restorative Service Providers"

## 2022-10-27 DIAGNOSIS — R252 Cramp and spasm: Secondary | ICD-10-CM

## 2022-10-27 DIAGNOSIS — M6281 Muscle weakness (generalized): Secondary | ICD-10-CM

## 2022-10-27 DIAGNOSIS — M5441 Lumbago with sciatica, right side: Secondary | ICD-10-CM | POA: Diagnosis not present

## 2022-10-27 DIAGNOSIS — R262 Difficulty in walking, not elsewhere classified: Secondary | ICD-10-CM

## 2022-10-27 DIAGNOSIS — M62830 Muscle spasm of back: Secondary | ICD-10-CM

## 2022-10-27 DIAGNOSIS — R29898 Other symptoms and signs involving the musculoskeletal system: Secondary | ICD-10-CM

## 2022-10-27 DIAGNOSIS — G8929 Other chronic pain: Secondary | ICD-10-CM

## 2022-10-27 NOTE — Therapy (Signed)
OUTPATIENT PHYSICAL THERAPY THORACOLUMBAR TREATMENT    Patient Name: Monique Gonzalez MRN: [id number] DOB:March 27, 1955, 67 y.o., female Today's Date: 10/27/2022  END OF SESSION:  PT End of Session - 10/27/22 1316     Visit Number 15    Number of Visits 24    Date for PT Re-Evaluation 11/25/22    Progress Note Due on Visit 20    PT Start Time 1315    PT Stop Time 1400    PT Time Calculation (min) 45 min    Activity Tolerance Patient tolerated treatment well              Past Medical History:  Diagnosis Date   Allergy    Anxiety    Phreesia 06/04/2020   Arthritis    Phreesia 07/08/2020   Chronic pain    back and knee   Colitis 09/2012   infectious vs inflammatory.    Colon stricture [facility])    Depression    Depression    Phreesia 06/04/2020   Depression    Phreesia 07/08/2020   GERD (gastroesophageal reflux disease)    Phreesia 06/04/2020   Hyperlipidemia    Obesity    Osteoarthritis    Osteopenia    Recurrent cold sores    Seasonal allergies    Syncope and collapse 11/30/2012   Normal EEG.  Vaso vagal syncope   Past Surgical History:  Procedure Laterality Date   BIOPSY  02/21/2020   Procedure: BIOPSY;  Surgeon: Iva Boop, MD;  Location: [facility] ENDOSCOPY;  Service: Endoscopy;;   COLON RESECTION N/A 10/14/2013   Procedure: LAPAROSCOPIC MOBILIZATION OF SPLENIC FLEXURE, LAPAROSCOPIC EXTENDED LEFT COLECTOMY;  Surgeon: Atilano Ina, MD;  Location: [facility] OR;  Service: General;  Laterality: N/A;   COLON SURGERY N/A    Phreesia 06/04/2020   COLONOSCOPY N/A 10/12/2013   Procedure: COLONOSCOPY;  Surgeon: Beverley Fiedler, MD;  Location: [facility] ENDOSCOPY;  Service: Endoscopy;  Laterality: N/A;   COLONOSCOPY WITH PROPOFOL N/A 02/21/2020   Procedure: COLONOSCOPY WITH PROPOFOL;  Surgeon: Iva Boop, MD;  Location: [facility] ENDOSCOPY;  Service: Endoscopy;  Laterality: N/A;   DILATION AND CURETTAGE, DIAGNOSTIC / THERAPEUTIC  1987   OPEN REDUCTION INTERNAL FIXATION (ORIF) DISTAL RADIAL  FRACTURE Right 08/08/2014   Procedure: OPEN REDUCTION INTERNAL FIXATION (ORIF) DISTAL RADIAL FRACTURE;  Surgeon: Dominica Severin, MD;  Location: [facility] OR;  Service: Orthopedics;  Laterality: Right;  DVR Crosslock, MD available sometime around 1430-1500   ORIF ANKLE FRACTURE Right 11/10/2019   Procedure: POSSIBLEOPEN REDUCTION INTERNAL FIXATION (ORIF) ANKLE FRACTURE;  Surgeon: Roby Lofts, MD;  Location: [facility] OR;  Service: Orthopedics;  Laterality: Right;   ORIF WRIST FRACTURE Left 11/10/2019   Procedure: OPEN REDUCTION INTERNAL FIXATION (ORIF) WRIST FRACTURE;  Surgeon: Roby Lofts, MD;  Location: [facility] OR;  Service: Orthopedics;  Laterality: Left;   TONSILLECTOMY     TOTAL KNEE ARTHROPLASTY Right 08/05/2016   Procedure: RIGHT TOTAL KNEE ARTHROPLASTY;  Surgeon: Kathryne Hitch, MD;  Location: [facility] OR;  Service: Orthopedics;  Laterality: Right;   Patient Active Problem List   Diagnosis Date Noted   Lumbar radiculopathy 09/26/2022   Spondylolisthesis of lumbar region 07/22/2022   Status post lumbar spinal fusion 07/22/2022   Ischemic stricture intestine (HCC)    Colitis 02/19/2020   Closed fracture of left distal radius 11/10/2019   Fall 11/10/2019   Closed right ankle fracture 11/09/2019   Ankle fracture 11/09/2019   Closed displaced fracture of styloid process of left ulna  Anxiety and depression 12/22/2016   Insulin resistance 10/15/2016   Presence of right artificial knee joint 10/14/2016   Vitamin D deficiency 04/08/2016   Constipation 09/20/2013   Chronic idiopathic constipation 07/05/2013   Recurrent cold sores    Osteoarthritis    Depression    Hyperlipidemia     PCP: Dr Terance Ice  REFERRING PROVIDER: Dr Lisbeth Renshaw  REFERRING DIAG: lumbar spinal stenosis  Rationale for Evaluation and Treatment: Rehabilitation  THERAPY DIAG:  Chronic bilateral low back pain with right-sided sciatica  Other symptoms and signs involving the musculoskeletal  system  Muscle spasm of back  Difficulty in walking, not elsewhere classified  Muscle weakness (generalized)  Cramp and spasm  ONSET DATE: 07/22/22  SUBJECTIVE:                                                                                                                                                                                           SUBJECTIVE STATEMENT: Patient reports that she is doing well with no more episodes of dizziness or feeling faint. The cooling wrap seems to have eliminated vasovagal response when she gets hot. She did have a very mild episode of feeling faint with increased activity today. Resolved with rest. She used the cooling band when she was grocery shopping and running errands over the weekend. She has cardiologist scheduled for !0/3/24. She is no longer using the lumbar support. She is working on some exercises at home and riding the elliptical bike without difficulty and is riding  the bike for 30 min. She is still only using the cane when she is out on unfamiliar surfaces or longer periods of time. Orthopedist gave her an injection in the L knee and started meloxacam which has helped the knee.   PERTINENT HISTORY:  LBP for 10 years on and off with significant pain in the past 1.5 years; arthritis; R TKA; L knee pain; fx bilat wrists ORIF; bowel resection; idiopathic constipation; depression; obesity  PAIN:  Are you having pain? Yes: NPRS scale: 0/10  Pain location: lower back; R side of spine  Pain description: aching; burning; muscle  Aggravating factors: walking Relieving factors: sitting; lying in the bed; muscle relaxer   PRECAUTIONS: post op lumbar fusion- to remain in lumbar brace until RTD 10/06/22; out of brace to sleep; no lifting; no bending hands past knees   WEIGHT BEARING RESTRICTIONS: No  FALLS:  Has patient fallen in last 6 months? 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   PATIENT SURVEYS:  FOTO 25; goal 42 10/08/22 - 40    SENSATION: WFL  MUSCLE LENGTH: Hamstrings: Right 75 deg; Left 70 deg Thomas test: tight bilat   POSTURE: rounded shoulders, forward head, and flexed trunk   PALPATION: Muscular tightness noted through hip flexors bilat; Rt thoracolumbar paraspinals  (Patient in lumbar brace limiting palpation)   LUMBAR ROM:   AROM eval 10/08/22  Flexion Fingers to knees  Fingers to ankles   Extension Neutral 20%   Right lateral flexion 60% pulling pain L LB 80%  Left lateral flexion 65% cramping L LB 80%  Right rotation NT 50%  Left rotation NT 50%   (Blank rows = not tested)  LOWER EXTREMITY ROM:  hip mobility WFL's except hip extension which is limited   (ROM generally limited by obesity)  Active  Right eval Left eval  Hip flexion    Hip extension    Hip abduction    Hip adduction    Hip internal rotation    Hip external rotation    Knee flexion    Knee extension    Ankle dorsiflexion    Ankle plantarflexion    Ankle inversion    Ankle eversion     (Blank rows = not tested)  LOWER EXTREMITY MMT:  assessed in sitting and supine - patient unable to lie sidelying or prone     10/08/22: tested in sidelying and supine   MMT Right eval 10/08/22 Left eval 10/08/22  Hip flexion 4 5 4  5-  Hip extension 4- 5- 4- 5-  Hip abduction 4 5- 4 4+  Hip adduction      Hip internal rotation      Hip external rotation      Knee flexion 5  5   Knee extension 5  5    Ankle dorsiflexion      Ankle plantarflexion      Ankle inversion      Ankle eversion       (Blank rows = not tested)  LUMBAR SPECIAL TESTS:  Straight leg raise test: Negative and Slump test: Negative  FUNCTIONAL TESTS:  5 times sit to stand: 18.46 sec using UE's to move sit to stand; reports cramping R side       10/08/22 - 14.28 sec  GAIT: Distance walked: 40 Assistive device utilized: no assistive Level of assistance: Complete Independence Comments: wide based gait; flexed forward at hips; limp R LE during Wt bearing R    [facility] Adult PT Treatment:                                                DATE: 10/27/22 Therapeutic Exercise: NuStep L7 x 7 min Standing  Row blue TB 3 sec x 10 x 3  Shoulder extension blue TB 3 sec x 10 x 3  Antirotation blue TB R/L 3 sec x 10 x 2 Supine  Alternating hip abduction green TB R/L 3 sec x 20 Bridging  3-5 sec x 10 x 2 Hip adduction w/ball squeeze 3 sec x 10 x 2 Bilat shoulder horizontal abduction red TB 3 sec x 10 x 2  Diagonal red TB R/L x 10 x 2 Shoulder flexion red TB btn hands raising arms overhead x 10  Hip flexion and extension in hooklying with core tight R/L x 10  Sitting Sit to stand holding 5# wt x 10  Sitting arm angel x 10  Sitting backward shoulder rolls x 10  Sitting lateral cervical flexion 5 sec x 3   Gait  Walking step stop to work on gait pattern and balance  Backward walking    Will add back as tolerated: Back to wall - lifting hips from wall, keeping knees straight  5 sec x 10 Hip abduction leading with heel UE support as needed for balance 10 R/L x 2 sets  Standing facing wall hip extension x 10 L/R    [facility] Adult PT Treatment:                                                DATE: 10/23/22 Therapeutic Exercise: NuStep L7 x 7 min Standing  Row green TB 3 sec x 10 x 3  Shoulder extension green TB 3 sec x 10 x 3  Antirotation green TB R/L 3 sec x 10 x 2 Supine  Alternating hip abduction green TB R/L 3 sec x  20 Bridging 3-5 sec x 10 x 2 Hip adduction w/ball squeeze 3 sec x 10 x 2 Bilat shoulder horizontal abduction red TB 3 sec x 10 x 2  Diagonal red TB R/L x 10 x 2 Sitting Sit to stand holding 5# wt x 10  Sitting arm angel x 10  Sitting backward shoulder rolls x 10  Sitting lateral cervical flexion 5 sec x 3   Gait  Walking step stop to work on gait pattern and balance  Backward walking    Will add back at next visit: Back to wall - lifting hips from wall, keeping knees straight  5 sec x 10 Hip abduction leading with heel UE support as needed for balance 10 R/L x 2 sets  Standing facing wall hip extension x 10 L/R   PATIENT EDUCATION:  Education details: Updated HEP  Person educated: Patient Education method: Explanation, Demonstration, Tactile cues, Verbal cues, and Handouts Education comprehension: verbalized understanding, returned demonstration, verbal cues required, tactile cues required, and needs further education  HOME EXERCISE PROGRAM:  Access Code: [email or web address] URL: [email or web address] Date: 10/16/2022 Prepared by: Corlis Leak  Exercises - Supine Posterior Pelvic Tilt  - 1 x daily - 7 x weekly - 3 sets - 10 reps - Supine Single Knee to Chest Stretch  - 1 x daily - 7 x weekly - 1 sets - 3-5 reps - 10-30 sec hold - Supine Hip Adduction Isometric with Ball  - 1 x daily - 7 x weekly - 1 sets - 5 reps - Supine Bridge with Mini Swiss Ball Between Knees  - 1 x daily - 7 x weekly - 3 sets - 10 reps - 3 sec hold - Supine Hamstring Stretch with Strap  - 1 x daily - 7 x weekly - 1 sets - 3-5 reps - 30 sec hold - Supine ITB Stretch with Strap  - 1 x daily - 7 x weekly - 1 sets -  3-5 reps - 30 sec hold - Seated Quadratus Lumborum Stretch with Forward Bend  - 1 x daily - 7 x weekly - 2 sets - 10 reps - 5-10 sec hold - Seated Flexion Stretch with Swiss Ball  - 1 x daily - 7 x weekly - 2 sets - 10 reps - 5-10 sec hold - Seated Single Leg Hip Abduction with Resistance  - 1  x daily - 7 x weekly - 3 sets - 10 reps - Side Stepping with Resistance at Ankles and Counter Support  - 1 x daily - 7 x weekly - 3 sets - 10 reps - Squat with Chair Touch  - 1 x daily - 7 x weekly - 3 sets - 10 reps - Supine Piriformis Stretch with Leg Straight  - 1 x daily - 7 x weekly - 1 sets - 3 reps - 30 sec  hold - Hooklying Isometric Clamshell  - 1 x daily - 7 x weekly - 1 sets - 10 reps - 3 sec  hold - Standing Bilateral Low Shoulder Row with Anchored Resistance  - 2 x daily - 7 x weekly - 1-3 sets - 10 reps - 2-3 sec  hold - Shoulder extension with resistance - Neutral  - 1 x daily - 7 x weekly - 1-2 sets - 10 reps - 3-5 sec  hold - Hooklying Shoulder I  - 1 x daily - 7 x weekly - 1 sets - 10 reps - 3 sec  hold - Shoulder Flexion Wall Slide with Towel  - 1 x daily - 7 x weekly - 1-2 sets - 10 reps - 3 sec  hold - Wall Push Up  - 1 x daily - 7 x weekly - 1-3 sets - 10 reps - 3 sec  hold - Standing Transverse Abdominis Contraction  - 1 x daily - 7 x weekly - 3 sets - 10 reps - Supine March with Posterior Pelvic Tilt  - 1 x daily - 7 x weekly - 3 sets - 10 reps - Standing Hip Hiking  - 1 x daily - 7 x weekly - 3 sets - 10 reps - Standing Hip Extension with Counter Support  - 1 x daily - 7 x weekly - 1-2 sets - 10 reps - 3-5 sec  hold - Wall Angels  - 1 x daily - 7 x weekly - 1-3 sets - 10 reps - 2-3 sec  hold - Anti-Rotation Lateral Stepping with Press  - 1 x daily - 7 x weekly - 1-2 sets - 10 reps - 2-3 sec  hold  ASSESSMENT:  CLINICAL IMPRESSION:  Patient had one mild episode of vasovagal type response to increased resistance. Exercises at home have increased again. Patient has increased activity level and is tolerated with minimal difficult. She is OK to continue with her exercise program. Patient has a cool wrap on her head for exercises today and feels that will help with exercise tolerance. Continued work with core stabilization and strengthening. Patient tolerated more exercises in  standing today. Continued exercises today without an episode of feeling faint.    GOALS: Goals reviewed with patient? Yes  SHORT TERM GOALS: Target date: 10/14/2022  Independent in initial HEP Baseline: Goal status: met  2.  Patient reports/demonstrates ability to stand for 5-10 min for light household and grooming tasks  Baseline:  Goal status: met  3.  Independent in gait with least restrictive assistive device for 500 feet without back spasm  Baseline:  Goal status: met   LONG TERM GOALS: Target date: 11/25/2022  Increase core strength and stability with patient to demonstrate ability to participate in 20 min of exercise without increase in symptoms  Baseline:  Goal status: on going   2.  4+/5 to 5/5 strength bilat LE's  Baseline:  Goal status: partially met   3.  Decrease sit to stand times test by 3-5 seconds  Baseline:  Goal status: met  4.  Patient demonstrates/reports ability to ambulate community distances for functional tasks with least restrictive assistive device (at least 1000 feet)  Baseline:  Goal status: on going   5.  Patient reports ability to do laundry at home  Baseline: doing dishes Goal status: partially accomplished  6.  Independent in HEP including aquatic program as indicated  Baseline:  Goal status: on going   7.  Improve functional limitation score to 42  Baseline: 25  10/08/22: 40  Goal status: partially accomplished   PLAN:  PT FREQUENCY: 2x/week  PT DURATION: 12 weeks  PLANNED INTERVENTIONS: Therapeutic exercises, Therapeutic activity, Neuromuscular re-education, Balance training, Gait training, Patient/Family education, Self Care, Joint mobilization, Aquatic Therapy, Dry Needling, Electrical stimulation, Spinal mobilization, Cryotherapy, Moist heat, Taping, Ultrasound, Ionotophoresis 4mg /ml Dexamethasone, Manual therapy, and Re-evaluation.  PLAN FOR NEXT SESSION: No side lying or quadruped (too painful). Lifting mechanics,  progress exercises as toerated; continue spine care and body mechanics education; manual work, DN, modalities as indicated. Balance as time allows   Estephani Popper P. Leonor Liv PT, MPH 10/27/22 1:17 PM

## 2022-10-30 ENCOUNTER — Encounter: Payer: Self-pay | Admitting: Rehabilitative and Restorative Service Providers"

## 2022-10-30 ENCOUNTER — Ambulatory Visit: Payer: Medicare Other | Admitting: Rehabilitative and Restorative Service Providers"

## 2022-10-30 DIAGNOSIS — R29898 Other symptoms and signs involving the musculoskeletal system: Secondary | ICD-10-CM

## 2022-10-30 DIAGNOSIS — R252 Cramp and spasm: Secondary | ICD-10-CM

## 2022-10-30 DIAGNOSIS — R262 Difficulty in walking, not elsewhere classified: Secondary | ICD-10-CM

## 2022-10-30 DIAGNOSIS — M6281 Muscle weakness (generalized): Secondary | ICD-10-CM

## 2022-10-30 DIAGNOSIS — G8929 Other chronic pain: Secondary | ICD-10-CM

## 2022-10-30 DIAGNOSIS — M5441 Lumbago with sciatica, right side: Secondary | ICD-10-CM | POA: Diagnosis not present

## 2022-10-30 DIAGNOSIS — M62830 Muscle spasm of back: Secondary | ICD-10-CM

## 2022-10-30 NOTE — Therapy (Signed)
OUTPATIENT PHYSICAL THERAPY THORACOLUMBAR TREATMENT    Patient Name: EMARIE ZIEGER MRN: [id number] DOB:Mar 28, 1955, 67 y.o., female Today's Date: 10/30/2022  END OF SESSION:  PT End of Session - 10/30/22 1319     Visit Number 16    Number of Visits 24    Date for PT Re-Evaluation 11/25/22    Authorization Type medicare & [employer] of [place]    Progress Note Due on Visit 20    PT Start Time 1315    PT Stop Time 1400    PT Time Calculation (min) 45 min    Activity Tolerance Patient tolerated treatment well              Past Medical History:  Diagnosis Date   Allergy    Anxiety    Phreesia 06/04/2020   Arthritis    Phreesia 07/08/2020   Chronic pain    back and knee   Colitis 09/2012   infectious vs inflammatory.    Colon stricture [facility])    Depression    Depression    Phreesia 06/04/2020   Depression    Phreesia 07/08/2020   GERD (gastroesophageal reflux disease)    Phreesia 06/04/2020   Hyperlipidemia    Obesity    Osteoarthritis    Osteopenia    Recurrent cold sores    Seasonal allergies    Syncope and collapse 11/30/2012   Normal EEG.  Vaso vagal syncope   Past Surgical History:  Procedure Laterality Date   BIOPSY  02/21/2020   Procedure: BIOPSY;  Surgeon: Iva Boop, MD;  Location: [facility] ENDOSCOPY;  Service: Endoscopy;;   COLON RESECTION N/A 10/14/2013   Procedure: LAPAROSCOPIC MOBILIZATION OF SPLENIC FLEXURE, LAPAROSCOPIC EXTENDED LEFT COLECTOMY;  Surgeon: Atilano Ina, MD;  Location: [facility] OR;  Service: General;  Laterality: N/A;   COLON SURGERY N/A    Phreesia 06/04/2020   COLONOSCOPY N/A 10/12/2013   Procedure: COLONOSCOPY;  Surgeon: Beverley Fiedler, MD;  Location: [facility] ENDOSCOPY;  Service: Endoscopy;  Laterality: N/A;   COLONOSCOPY WITH PROPOFOL N/A 02/21/2020   Procedure: COLONOSCOPY WITH PROPOFOL;  Surgeon: Iva Boop, MD;  Location: [facility] ENDOSCOPY;  Service: Endoscopy;  Laterality: N/A;   DILATION AND CURETTAGE, DIAGNOSTIC / THERAPEUTIC  1987    OPEN REDUCTION INTERNAL FIXATION (ORIF) DISTAL RADIAL FRACTURE Right 08/08/2014   Procedure: OPEN REDUCTION INTERNAL FIXATION (ORIF) DISTAL RADIAL FRACTURE;  Surgeon: Dominica Severin, MD;  Location: [facility] OR;  Service: Orthopedics;  Laterality: Right;  DVR Crosslock, MD available sometime around 1430-1500   ORIF ANKLE FRACTURE Right 11/10/2019   Procedure: POSSIBLEOPEN REDUCTION INTERNAL FIXATION (ORIF) ANKLE FRACTURE;  Surgeon: Roby Lofts, MD;  Location: [facility] OR;  Service: Orthopedics;  Laterality: Right;   ORIF WRIST FRACTURE Left 11/10/2019   Procedure: OPEN REDUCTION INTERNAL FIXATION (ORIF) WRIST FRACTURE;  Surgeon: Roby Lofts, MD;  Location: [facility] OR;  Service: Orthopedics;  Laterality: Left;   TONSILLECTOMY     TOTAL KNEE ARTHROPLASTY Right 08/05/2016   Procedure: RIGHT TOTAL KNEE ARTHROPLASTY;  Surgeon: Kathryne Hitch, MD;  Location: [facility] OR;  Service: Orthopedics;  Laterality: Right;   Patient Active Problem List   Diagnosis Date Noted   Lumbar radiculopathy 09/26/2022   Spondylolisthesis of lumbar region 07/22/2022   Status post lumbar spinal fusion 07/22/2022   Ischemic stricture intestine (HCC)    Colitis 02/19/2020   Closed fracture of left distal radius 11/10/2019   Fall 11/10/2019   Closed right ankle fracture 11/09/2019   Ankle fracture 11/09/2019  Closed displaced fracture of styloid process of left ulna    Anxiety and depression 12/22/2016   Insulin resistance 10/15/2016   Presence of right artificial knee joint 10/14/2016   Vitamin D deficiency 04/08/2016   Constipation 09/20/2013   Chronic idiopathic constipation 07/05/2013   Recurrent cold sores    Osteoarthritis    Depression    Hyperlipidemia     PCP: Dr Terance Ice  REFERRING PROVIDER: Dr Lisbeth Renshaw  REFERRING DIAG: lumbar spinal stenosis  Rationale for Evaluation and Treatment: Rehabilitation  THERAPY DIAG:  Chronic bilateral low back pain with right-sided sciatica  Other  symptoms and signs involving the musculoskeletal system  Muscle spasm of back  Difficulty in walking, not elsewhere classified  Muscle weakness (generalized)  Cramp and spasm  ONSET DATE: 07/22/22  SUBJECTIVE:                                                                                                                                                                                           SUBJECTIVE STATEMENT: Patient reports that she is doing well with no more episodes of dizziness or feeling faint. The cooling wrap seems to have eliminated vasovagal response when she gets hot. She has cardiologist scheduled for !0/3/24. She is no longer using the lumbar support. She is working on some exercises at home and riding the elliptical bike without difficulty and is riding  the bike for 40 min. She is no longer using the cane. Orthopedist gave her an injection in the L knee and started meloxacam which has helped the knee.   PERTINENT HISTORY:  LBP for 10 years on and off with significant pain in the past 1.5 years; arthritis; R TKA; L knee pain; fx bilat wrists ORIF; bowel resection; idiopathic constipation; depression; obesity  PAIN:  Are you having pain? Yes: NPRS scale: 0/10  Pain location: lower back; R side of spine  Pain description: aching; burning; muscle  Aggravating factors: walking Relieving factors: sitting; lying in the bed; muscle relaxer   PRECAUTIONS: post op lumbar fusion- to remain in lumbar brace until RTD 10/06/22; out of brace to sleep; no lifting; no bending hands past knees   WEIGHT BEARING RESTRICTIONS: No  FALLS:  Has patient fallen in last 6 months? 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   PATIENT SURVEYS:  FOTO 25; goal 42 10/08/22 - 40    SENSATION: WFL  MUSCLE LENGTH: Hamstrings: Right 75 deg; Left 70 deg Thomas test: tight bilat   POSTURE: rounded shoulders, forward head, and flexed trunk   PALPATION: Muscular tightness noted through hip flexors bilat; Rt thoracolumbar paraspinals  (Patient in lumbar brace limiting palpation)   LUMBAR ROM:   AROM eval 10/08/22  Flexion Fingers to knees  Fingers to ankles   Extension Neutral 20%   Right lateral flexion 60% pulling pain L LB 80%  Left lateral flexion 65% cramping L LB 80%  Right rotation NT 50%  Left rotation NT 50%   (Blank rows = not tested)  LOWER EXTREMITY ROM:  hip mobility WFL's except hip extension which is limited   (ROM generally limited by obesity)  Active  Right eval Left eval  Hip flexion    Hip extension    Hip abduction    Hip adduction    Hip internal rotation    Hip external rotation    Knee flexion    Knee extension    Ankle dorsiflexion    Ankle plantarflexion    Ankle inversion    Ankle eversion     (Blank rows = not tested)  LOWER EXTREMITY MMT:  assessed in sitting and supine - patient unable to lie sidelying or prone     10/08/22: tested in sidelying and supine   MMT Right eval 10/08/22 Left eval 10/08/22  Hip flexion 4 5 4  5-  Hip extension 4- 5- 4- 5-  Hip abduction 4 5- 4 4+  Hip adduction      Hip internal rotation      Hip external rotation      Knee flexion 5  5   Knee extension 5  5   Ankle dorsiflexion      Ankle plantarflexion      Ankle inversion      Ankle eversion       (Blank rows = not tested)  LUMBAR SPECIAL TESTS:  Straight leg raise test: Negative and Slump test:  Negative  FUNCTIONAL TESTS:  5 times sit to stand: 18.46 sec using UE's to move sit to stand; reports cramping R side       10/08/22 - 14.28 sec  GAIT: Distance walked: 40 Assistive device utilized: no assistive Level of assistance: Complete Independence Comments: wide based gait; flexed forward at hips; limp R LE during Wt bearing R    [facility] Adult PT Treatment:                                                DATE: 10/30/22 Therapeutic Exercise: NuStep L7 x 7 min Standing  Row blue TB 3 sec x 10 x 3  Shoulder extension blue TB 3 sec x 10 x 3  Antirotation blue TB R/L 3 sec x 10  Bow and arrow blue TB R/L 3 sec x 10  Supine  Alternating hip abduction blue TB R/L 3 sec x 20 Bridging with hip abduction blue TB 3-5 sec x 10 x 2 Hip adduction w/ball squeeze 3 sec x 10 x 2 Bilat shoulder horizontal abduction  red TB 3 sec x 10 x 2  Diagonal red TB R/L x 10 x 2 Shoulder flexion red TB btn hands raising arms overhead x 10  Hip flexion and extension in hooklying with core tight R/L x 10  Sitting Sit to stand holding 5# wt x 10  Sitting arm angel x 10  Sitting backward shoulder rolls x 10  Sitting lateral cervical flexion 5 sec x 3   Gait  Walking step stop to work on gait pattern and balance  Backward walking    Will add back as tolerated: Back to wall - lifting hips from wall, keeping knees straight  5 sec x 10 Hip abduction leading with heel UE support as needed for balance 10 R/L x 2 sets  Standing facing wall hip extension x 10 L/R    [facility] Adult PT Treatment:                                                DATE: 10/27/22 Therapeutic Exercise: NuStep L7 x 7 min Standing  Row blue TB 3 sec x 10 x 3  Shoulder extension blue TB 3 sec x 10 x 3  Antirotation blue TB R/L 3 sec x 10 x 2 Supine  Alternating hip abduction green TB R/L 3 sec x 20 Bridging 3-5 sec x 10 x 2 Hip adduction w/ball squeeze 3 sec x 10 x 2 Bilat shoulder horizontal abduction red TB 3 sec x 10 x 2  Diagonal  red TB R/L x 10 x 2 Shoulder flexion red TB btn hands raising arms overhead x 10  Hip flexion and extension in hooklying with core tight R/L x 10  Sitting Sit to stand holding 5# wt x 10  Sitting arm angel x 10  Sitting backward shoulder rolls x 10  Sitting lateral cervical flexion 5 sec x 3   Gait  Walking step stop to work on gait pattern and balance  Backward walking    Will add back as tolerated: Back to wall - lifting hips from wall, keeping knees straight  5 sec x 10 Hip abduction leading with heel UE support as needed for balance 10 R/L x 2 sets  Standing facing wall hip extension x 10 L/R     PATIENT EDUCATION:  Education details: Updated HEP  Person educated: Patient Education method: Explanation, Demonstration, Tactile cues, Verbal cues, and Handouts Education comprehension: verbalized understanding, returned demonstration, verbal cues required, tactile cues required, and needs further education  HOME EXERCISE PROGRAM:  Access Code: [email or web address] URL: [email or web address] Date: 10/30/2022 Prepared by: Corlis Leak  Exercises - Supine Posterior Pelvic Tilt  - 1 x daily - 7 x weekly - 3 sets - 10 reps - Supine Single Knee to Chest Stretch  - 1 x daily - 7 x weekly - 1 sets - 3-5 reps - 10-30 sec hold - Supine Hip Adduction Isometric with Ball  - 1 x daily - 7 x weekly - 1 sets - 5 reps - Supine Bridge with Mini Swiss Ball Between Knees  - 1 x daily - 7 x weekly - 3 sets - 10 reps - 3 sec hold - Supine Hamstring Stretch with Strap  - 1 x daily - 7 x weekly - 1 sets - 3-5 reps - 30 sec hold - Supine ITB Stretch with Strap  - 1 x daily -  7 x weekly - 1 sets - 3-5 reps - 30 sec hold - Seated Quadratus Lumborum Stretch with Forward Bend  - 1 x daily - 7 x weekly - 2 sets - 10 reps - 5-10 sec hold - Seated Flexion Stretch with Swiss Ball  - 1 x daily - 7 x weekly - 2 sets - 10 reps - 5-10 sec hold - Seated Single Leg Hip Abduction with Resistance  - 1 x daily - 7 x  weekly - 3 sets - 10 reps - Side Stepping with Resistance at Ankles and Counter Support  - 1 x daily - 7 x weekly - 3 sets - 10 reps - Squat with Chair Touch  - 1 x daily - 7 x weekly - 3 sets - 10 reps - Supine Piriformis Stretch with Leg Straight  - 1 x daily - 7 x weekly - 1 sets - 3 reps - 30 sec  hold - Hooklying Isometric Clamshell  - 1 x daily - 7 x weekly - 1 sets - 10 reps - 3 sec  hold - Standing Bilateral Low Shoulder Row with Anchored Resistance  - 2 x daily - 7 x weekly - 1-3 sets - 10 reps - 2-3 sec  hold - Shoulder extension with resistance - Neutral  - 1 x daily - 7 x weekly - 1-2 sets - 10 reps - 3-5 sec  hold - Hooklying Shoulder I  - 1 x daily - 7 x weekly - 1 sets - 10 reps - 3 sec  hold - Shoulder Flexion Wall Slide with Towel  - 1 x daily - 7 x weekly - 1-2 sets - 10 reps - 3 sec  hold - Wall Push Up  - 1 x daily - 7 x weekly - 1-3 sets - 10 reps - 3 sec  hold - Standing Transverse Abdominis Contraction  - 1 x daily - 7 x weekly - 3 sets - 10 reps - Supine March with Posterior Pelvic Tilt  - 1 x daily - 7 x weekly - 3 sets - 10 reps - Standing Hip Hiking  - 1 x daily - 7 x weekly - 3 sets - 10 reps - Standing Hip Extension with Counter Support  - 1 x daily - 7 x weekly - 1-2 sets - 10 reps - 3-5 sec  hold - Wall Angels  - 1 x daily - 7 x weekly - 1-3 sets - 10 reps - 2-3 sec  hold - Anti-Rotation Lateral Stepping with Press  - 1 x daily - 7 x weekly - 1-2 sets - 10 reps - 2-3 sec  hold - Drawing Bow  - 1 x daily - 7 x weekly - 1 sets - 10 reps - 3 sec  hold  ASSESSMENT:  CLINICAL IMPRESSION:  Patient tolerated exercise well. She is increasing her exercises at home. Patient has increased activity level at home, resuming household chores such as loading and unloading the dishwasher, some cooking, laundry. Patient is still using a cool wrap on her head for exercises which she feels helps her with increased exercise tolerance. Continued work with core stabilization and  strengthening. Patient tolerated more exercises in standing today. Continued exercises today without an episode of feeling faint.    GOALS: Goals reviewed with patient? Yes  SHORT TERM GOALS: Target date: 10/14/2022  Independent in initial HEP Baseline: Goal status: met  2.  Patient reports/demonstrates ability to stand for 5-10 min for light household and grooming  tasks  Baseline:  Goal status: met  3.  Independent in gait with least restrictive assistive device for 500 feet without back spasm  Baseline:  Goal status: met   LONG TERM GOALS: Target date: 11/25/2022  Increase core strength and stability with patient to demonstrate ability to participate in 20 min of exercise without increase in symptoms  Baseline:  Goal status: on going   2.  4+/5 to 5/5 strength bilat LE's  Baseline:  Goal status: partially met   3.  Decrease sit to stand times test by 3-5 seconds  Baseline:  Goal status: met  4.  Patient demonstrates/reports ability to ambulate community distances for functional tasks with least restrictive assistive device (at least 1000 feet)  Baseline:  Goal status: on going   5.  Patient reports ability to do laundry at home  Baseline: doing dishes Goal status: partially accomplished  6.  Independent in HEP including aquatic program as indicated  Baseline:  Goal status: on going   7.  Improve functional limitation score to 42  Baseline: 25  10/08/22: 40  Goal status: partially accomplished   PLAN:  PT FREQUENCY: 2x/week  PT DURATION: 12 weeks  PLANNED INTERVENTIONS: Therapeutic exercises, Therapeutic activity, Neuromuscular re-education, Balance training, Gait training, Patient/Family education, Self Care, Joint mobilization, Aquatic Therapy, Dry Needling, Electrical stimulation, Spinal mobilization, Cryotherapy, Moist heat, Taping, Ultrasound, Ionotophoresis 4mg /ml Dexamethasone, Manual therapy, and Re-evaluation.  PLAN FOR NEXT SESSION: No side lying or  quadruped (too painful). Lifting mechanics, progress exercises as toerated; continue spine care and body mechanics education; manual work, DN, modalities as indicated. Balance as time allows   Sylvio Weatherall P. Leonor Liv PT, MPH 10/30/22 1:21 PM

## 2022-11-03 ENCOUNTER — Encounter: Payer: Self-pay | Admitting: Rehabilitative and Restorative Service Providers"

## 2022-11-03 ENCOUNTER — Ambulatory Visit: Payer: Medicare Other | Admitting: Rehabilitative and Restorative Service Providers"

## 2022-11-03 DIAGNOSIS — M62830 Muscle spasm of back: Secondary | ICD-10-CM

## 2022-11-03 DIAGNOSIS — M5441 Lumbago with sciatica, right side: Secondary | ICD-10-CM | POA: Diagnosis not present

## 2022-11-03 DIAGNOSIS — G8929 Other chronic pain: Secondary | ICD-10-CM

## 2022-11-03 DIAGNOSIS — R29898 Other symptoms and signs involving the musculoskeletal system: Secondary | ICD-10-CM

## 2022-11-03 DIAGNOSIS — R252 Cramp and spasm: Secondary | ICD-10-CM

## 2022-11-03 DIAGNOSIS — M6281 Muscle weakness (generalized): Secondary | ICD-10-CM

## 2022-11-03 DIAGNOSIS — R262 Difficulty in walking, not elsewhere classified: Secondary | ICD-10-CM

## 2022-11-03 NOTE — Therapy (Signed)
OUTPATIENT PHYSICAL THERAPY THORACOLUMBAR TREATMENT    Patient Name: Monique Gonzalez MRN: [id number] DOB:07/22/1955, 67 y.o., female Today's Date: 11/03/2022  END OF SESSION:  PT End of Session - 11/03/22 1319     Visit Number 17    Number of Visits 24    Date for PT Re-Evaluation 11/25/22    Authorization Type medicare & [employer] of [place]    Progress Note Due on Visit 20    PT Start Time 1315    PT Stop Time 1400    PT Time Calculation (min) 45 min    Activity Tolerance Patient tolerated treatment well              Past Medical History:  Diagnosis Date   Allergy    Anxiety    Phreesia 06/04/2020   Arthritis    Phreesia 07/08/2020   Chronic pain    back and knee   Colitis 09/2012   infectious vs inflammatory.    Colon stricture [facility])    Depression    Depression    Phreesia 06/04/2020   Depression    Phreesia 07/08/2020   GERD (gastroesophageal reflux disease)    Phreesia 06/04/2020   Hyperlipidemia    Obesity    Osteoarthritis    Osteopenia    Recurrent cold sores    Seasonal allergies    Syncope and collapse 11/30/2012   Normal EEG.  Vaso vagal syncope   Past Surgical History:  Procedure Laterality Date   BIOPSY  02/21/2020   Procedure: BIOPSY;  Surgeon: Iva Boop, MD;  Location: [facility] ENDOSCOPY;  Service: Endoscopy;;   COLON RESECTION N/A 10/14/2013   Procedure: LAPAROSCOPIC MOBILIZATION OF SPLENIC FLEXURE, LAPAROSCOPIC EXTENDED LEFT COLECTOMY;  Surgeon: Atilano Ina, MD;  Location: [facility] OR;  Service: General;  Laterality: N/A;   COLON SURGERY N/A    Phreesia 06/04/2020   COLONOSCOPY N/A 10/12/2013   Procedure: COLONOSCOPY;  Surgeon: Beverley Fiedler, MD;  Location: [facility] ENDOSCOPY;  Service: Endoscopy;  Laterality: N/A;   COLONOSCOPY WITH PROPOFOL N/A 02/21/2020   Procedure: COLONOSCOPY WITH PROPOFOL;  Surgeon: Iva Boop, MD;  Location: [facility] ENDOSCOPY;  Service: Endoscopy;  Laterality: N/A;   DILATION AND CURETTAGE, DIAGNOSTIC / THERAPEUTIC  1987    OPEN REDUCTION INTERNAL FIXATION (ORIF) DISTAL RADIAL FRACTURE Right 08/08/2014   Procedure: OPEN REDUCTION INTERNAL FIXATION (ORIF) DISTAL RADIAL FRACTURE;  Surgeon: Dominica Severin, MD;  Location: [facility] OR;  Service: Orthopedics;  Laterality: Right;  DVR Crosslock, MD available sometime around 1430-1500   ORIF ANKLE FRACTURE Right 11/10/2019   Procedure: POSSIBLEOPEN REDUCTION INTERNAL FIXATION (ORIF) ANKLE FRACTURE;  Surgeon: Roby Lofts, MD;  Location: [facility] OR;  Service: Orthopedics;  Laterality: Right;   ORIF WRIST FRACTURE Left 11/10/2019   Procedure: OPEN REDUCTION INTERNAL FIXATION (ORIF) WRIST FRACTURE;  Surgeon: Roby Lofts, MD;  Location: [facility] OR;  Service: Orthopedics;  Laterality: Left;   TONSILLECTOMY     TOTAL KNEE ARTHROPLASTY Right 08/05/2016   Procedure: RIGHT TOTAL KNEE ARTHROPLASTY;  Surgeon: Kathryne Hitch, MD;  Location: [facility] OR;  Service: Orthopedics;  Laterality: Right;   Patient Active Problem List   Diagnosis Date Noted   Lumbar radiculopathy 09/26/2022   Spondylolisthesis of lumbar region 07/22/2022   Status post lumbar spinal fusion 07/22/2022   Ischemic stricture intestine (HCC)    Colitis 02/19/2020   Closed fracture of left distal radius 11/10/2019   Fall 11/10/2019   Closed right ankle fracture 11/09/2019   Ankle fracture 11/09/2019  Closed displaced fracture of styloid process of left ulna    Anxiety and depression 12/22/2016   Insulin resistance 10/15/2016   Presence of right artificial knee joint 10/14/2016   Vitamin D deficiency 04/08/2016   Constipation 09/20/2013   Chronic idiopathic constipation 07/05/2013   Recurrent cold sores    Osteoarthritis    Depression    Hyperlipidemia     PCP: Dr Terance Ice  REFERRING PROVIDER: Dr Lisbeth Renshaw  REFERRING DIAG: lumbar spinal stenosis  Rationale for Evaluation and Treatment: Rehabilitation  THERAPY DIAG:  Chronic bilateral low back pain with right-sided sciatica  Other  symptoms and signs involving the musculoskeletal system  Muscle spasm of back  Difficulty in walking, not elsewhere classified  Muscle weakness (generalized)  Cramp and spasm  ONSET DATE: 07/22/22  SUBJECTIVE:                                                                                                                                                                                           SUBJECTIVE STATEMENT: Patient reports that her L knee is irritated today possibly from being more active with moving some furniture on the back porch. No more episodes of dizziness or feeling faint. The cooling wrap has eliminated vasovagal response which seems to occur when she gets hot. She has cardiologist scheduled for !0/3/24. She is no longer using the lumbar support. She is working on some exercises at home and riding the elliptical bike without difficulty and is riding  the bike for 40 min. She is no longer using the cane. Orthopedist gave her an injection in the L knee a few weeks and started meloxacam which has helped the knee.   PERTINENT HISTORY:  LBP for 10 years on and off with significant pain in the past 1.5 years; arthritis; R TKA; L knee pain; fx bilat wrists ORIF; bowel resection; idiopathic constipation; depression; obesity  PAIN:  Are you having pain? Yes: NPRS scale: 0/10  Pain location: lower back; R side of spine  Pain description: aching; burning; muscle  Aggravating factors: walking Relieving factors: sitting; lying in the bed; muscle relaxer   PRECAUTIONS: post op lumbar fusion- to remain in lumbar brace until RTD 10/06/22; out of brace to sleep; no lifting; no bending hands past knees   WEIGHT BEARING RESTRICTIONS: No  FALLS:  Has patient fallen in last 6 months? 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   PATIENT SURVEYS:  FOTO 25; goal 42 10/08/22 - 40    SENSATION: WFL  MUSCLE LENGTH: Hamstrings: Right 75 deg; Left 70 deg Thomas test: tight bilat   POSTURE: rounded shoulders, forward head, and flexed trunk   PALPATION: Muscular tightness noted through hip flexors bilat; Rt thoracolumbar paraspinals  (Patient in lumbar brace limiting palpation)   LUMBAR ROM:   AROM eval 10/08/22  Flexion Fingers to knees  Fingers to ankles   Extension Neutral 20%   Right lateral flexion 60% pulling pain L LB 80%  Left lateral flexion 65% cramping L LB 80%  Right rotation NT 50%  Left rotation NT 50%   (Blank rows = not tested)  LOWER EXTREMITY ROM:  hip mobility WFL's except hip extension which is limited   (ROM generally limited by obesity)  Active  Right eval Left eval  Hip flexion    Hip extension    Hip abduction    Hip adduction    Hip internal rotation    Hip external rotation    Knee flexion    Knee extension    Ankle dorsiflexion    Ankle plantarflexion    Ankle inversion    Ankle eversion     (Blank rows = not tested)  LOWER EXTREMITY MMT:  assessed in sitting and supine - patient unable to lie sidelying or prone     10/08/22: tested in sidelying and supine   MMT Right eval 10/08/22 Left eval 10/08/22  Hip flexion 4 5 4  5-  Hip extension 4- 5- 4- 5-  Hip abduction 4 5- 4 4+  Hip adduction      Hip internal rotation      Hip external rotation      Knee flexion 5  5   Knee extension 5  5   Ankle dorsiflexion      Ankle plantarflexion      Ankle inversion      Ankle eversion       (Blank  rows = not tested)  LUMBAR SPECIAL TESTS:  Straight leg raise test: Negative and Slump test: Negative  FUNCTIONAL TESTS:  5 times sit to stand: 18.46 sec using UE's to move sit to stand; reports cramping R side       10/08/22 - 14.28 sec  GAIT: Distance walked: 40 Assistive device utilized: no assistive Level of assistance: Complete Independence Comments: wide based gait; flexed forward at hips; limp R LE during Wt bearing R    [facility] Adult PT Treatment:                                                DATE: 11/03/22 Therapeutic Exercise: NuStep L7 x 7 min Standing  Row blue TB 3 sec x 10 x 2  Shoulder extension blue TB 3 sec x 10 x 2  Antirotation blue TB R/L 3 sec x 10 x2 Supine  Ball btn knees to overhead holding core tight x 10  Rolling ball into hip/knee flexion x 10  Alternating hip abduction blue TB R/L 3  sec x 20 Bridging with hip abduction blue TB 3-5 sec x 10 x 2 Hip adduction w/ball squeeze 3 sec x 10 x 2 Bilat shoulder horizontal abduction red TB 3 sec x 10 x 2  Diagonal red TB R/L x 10 x 2 Shoulder flexion red TB btn hands raising arms overhead x 10 x 2 Hip flexion and extension in hooklying with core tight R/L x 10  Sitting Sit to stand holding 5# wt x 10 hold due to knee pain Sitting arm angel x 10  Sitting backward shoulder rolls x 10  Sitting lateral cervical flexion 5 sec x 3   Gait - hold due to knee pain Walking step stop to work on gait pattern and balance  Backward walking    Will add back as tolerated: Back to wall - lifting hips from wall, keeping knees straight  5 sec x 10 Hip abduction leading with heel UE support as needed for balance 10 R/L x 2 sets  Standing facing wall hip extension x 10 L/R   [facility] Adult PT Treatment:                                                DATE: 10/30/22 Therapeutic Exercise: NuStep L7 x 7 min Standing  Row blue TB 3 sec x 10 x 3  Shoulder extension blue TB 3 sec x 10 x 3  Antirotation blue TB R/L 3 sec x 10  Bow  and arrow blue TB R/L 3 sec x 10  Supine  Alternating hip abduction blue TB R/L 3 sec x 20 Bridging with hip abduction blue TB 3-5 sec x 10 x 2 Hip adduction w/ball squeeze 3 sec x 10 x 2 Bilat shoulder horizontal abduction red TB 3 sec x 10 x 2  Diagonal red TB R/L x 10 x 2 Shoulder flexion red TB btn hands raising arms overhead x 10  Hip flexion and extension in hooklying with core tight R/L x 10  Sitting Sit to stand holding 5# wt x 10  Sitting arm angel x 10  Sitting backward shoulder rolls x 10  Sitting lateral cervical flexion 5 sec x 3   Gait  Walking step stop to work on gait pattern and balance  Backward walking    Will add back as tolerated: Back to wall - lifting hips from wall, keeping knees straight  5 sec x 10 Hip abduction leading with heel UE support as needed for balance 10 R/L x 2 sets  Standing facing wall hip extension x 10 L/R    PATIENT EDUCATION:  Education details: Updated HEP  Person educated: Patient Education method: Explanation, Demonstration, Tactile cues, Verbal cues, and Handouts Education comprehension: verbalized understanding, returned demonstration, verbal cues required, tactile cues required, and needs further education  HOME EXERCISE PROGRAM:  Access Code: [email or web address] URL: [email or web address] Date: 10/30/2022 Prepared by: Corlis Leak  Exercises - Supine Posterior Pelvic Tilt  - 1 x daily - 7 x weekly - 3 sets - 10 reps - Supine Single Knee to Chest Stretch  - 1 x daily - 7 x weekly - 1 sets - 3-5 reps - 10-30 sec hold - Supine Hip Adduction Isometric with Ball  - 1 x daily - 7 x weekly - 1 sets - 5 reps - Supine Bridge with Mini Swiss Ball Between Knees  - 1 x  daily - 7 x weekly - 3 sets - 10 reps - 3 sec hold - Supine Hamstring Stretch with Strap  - 1 x daily - 7 x weekly - 1 sets - 3-5 reps - 30 sec hold - Supine ITB Stretch with Strap  - 1 x daily - 7 x weekly - 1 sets - 3-5 reps - 30 sec hold - Seated Quadratus Lumborum  Stretch with Forward Bend  - 1 x daily - 7 x weekly - 2 sets - 10 reps - 5-10 sec hold - Seated Flexion Stretch with Swiss Ball  - 1 x daily - 7 x weekly - 2 sets - 10 reps - 5-10 sec hold - Seated Single Leg Hip Abduction with Resistance  - 1 x daily - 7 x weekly - 3 sets - 10 reps - Side Stepping with Resistance at Ankles and Counter Support  - 1 x daily - 7 x weekly - 3 sets - 10 reps - Squat with Chair Touch  - 1 x daily - 7 x weekly - 3 sets - 10 reps - Supine Piriformis Stretch with Leg Straight  - 1 x daily - 7 x weekly - 1 sets - 3 reps - 30 sec  hold - Hooklying Isometric Clamshell  - 1 x daily - 7 x weekly - 1 sets - 10 reps - 3 sec  hold - Standing Bilateral Low Shoulder Row with Anchored Resistance  - 2 x daily - 7 x weekly - 1-3 sets - 10 reps - 2-3 sec  hold - Shoulder extension with resistance - Neutral  - 1 x daily - 7 x weekly - 1-2 sets - 10 reps - 3-5 sec  hold - Hooklying Shoulder I  - 1 x daily - 7 x weekly - 1 sets - 10 reps - 3 sec  hold - Shoulder Flexion Wall Slide with Towel  - 1 x daily - 7 x weekly - 1-2 sets - 10 reps - 3 sec  hold - Wall Push Up  - 1 x daily - 7 x weekly - 1-3 sets - 10 reps - 3 sec  hold - Standing Transverse Abdominis Contraction  - 1 x daily - 7 x weekly - 3 sets - 10 reps - Supine March with Posterior Pelvic Tilt  - 1 x daily - 7 x weekly - 3 sets - 10 reps - Standing Hip Hiking  - 1 x daily - 7 x weekly - 3 sets - 10 reps - Standing Hip Extension with Counter Support  - 1 x daily - 7 x weekly - 1-2 sets - 10 reps - 3-5 sec  hold - Wall Angels  - 1 x daily - 7 x weekly - 1-3 sets - 10 reps - 2-3 sec  hold - Anti-Rotation Lateral Stepping with Press  - 1 x daily - 7 x weekly - 1-2 sets - 10 reps - 2-3 sec  hold - Drawing Bow  - 1 x daily - 7 x weekly - 1 sets - 10 reps - 3 sec  hold  ASSESSMENT:  CLINICAL IMPRESSION: Some increased pain in the L knee today likely from increased physical activity. Modified exercises to avoid as many standing and  walking exercises. Patient tolerated exercise well. Patient is still using a cool wrap on her head for exercises which does seem to help her with increased exercise tolerance. Continued work with core stabilization and strengthening. Continued exercises today without an episode of feeling faint.  GOALS: Goals reviewed with patient? Yes  SHORT TERM GOALS: Target date: 10/14/2022  Independent in initial HEP Baseline: Goal status: met  2.  Patient reports/demonstrates ability to stand for 5-10 min for light household and grooming tasks  Baseline:  Goal status: met  3.  Independent in gait with least restrictive assistive device for 500 feet without back spasm  Baseline:  Goal status: met   LONG TERM GOALS: Target date: 11/25/2022  Increase core strength and stability with patient to demonstrate ability to participate in 20 min of exercise without increase in symptoms  Baseline:  Goal status: on going   2.  4+/5 to 5/5 strength bilat LE's  Baseline:  Goal status: partially met   3.  Decrease sit to stand times test by 3-5 seconds  Baseline:  Goal status: met  4.  Patient demonstrates/reports ability to ambulate community distances for functional tasks with least restrictive assistive device (at least 1000 feet)  Baseline:  Goal status: on going   5.  Patient reports ability to do laundry at home  Baseline: doing dishes Goal status: partially accomplished  6.  Independent in HEP including aquatic program as indicated  Baseline:  Goal status: on going   7.  Improve functional limitation score to 42  Baseline: 25  10/08/22: 40  Goal status: partially accomplished   PLAN:  PT FREQUENCY: 2x/week  PT DURATION: 12 weeks  PLANNED INTERVENTIONS: Therapeutic exercises, Therapeutic activity, Neuromuscular re-education, Balance training, Gait training, Patient/Family education, Self Care, Joint mobilization, Aquatic Therapy, Dry Needling, Electrical stimulation, Spinal  mobilization, Cryotherapy, Moist heat, Taping, Ultrasound, Ionotophoresis 4mg /ml Dexamethasone, Manual therapy, and Re-evaluation.  PLAN FOR NEXT SESSION: No side lying or quadruped (too painful). Lifting mechanics, progress exercises as toerated; continue spine care and body mechanics education; manual work, DN, modalities as indicated. Balance as time allows   Deshonda Cryderman P. Leonor Liv PT, MPH 11/03/22 1:20 PM

## 2022-11-06 ENCOUNTER — Encounter: Payer: Self-pay | Admitting: Rehabilitative and Restorative Service Providers"

## 2022-11-06 ENCOUNTER — Ambulatory Visit: Payer: Medicare Other | Admitting: Rehabilitative and Restorative Service Providers"

## 2022-11-06 DIAGNOSIS — M5441 Lumbago with sciatica, right side: Secondary | ICD-10-CM | POA: Diagnosis not present

## 2022-11-06 DIAGNOSIS — M6281 Muscle weakness (generalized): Secondary | ICD-10-CM

## 2022-11-06 DIAGNOSIS — G8929 Other chronic pain: Secondary | ICD-10-CM

## 2022-11-06 DIAGNOSIS — R29898 Other symptoms and signs involving the musculoskeletal system: Secondary | ICD-10-CM

## 2022-11-06 DIAGNOSIS — R262 Difficulty in walking, not elsewhere classified: Secondary | ICD-10-CM

## 2022-11-06 DIAGNOSIS — R252 Cramp and spasm: Secondary | ICD-10-CM

## 2022-11-06 DIAGNOSIS — M62830 Muscle spasm of back: Secondary | ICD-10-CM

## 2022-11-06 NOTE — Therapy (Signed)
OUTPATIENT PHYSICAL THERAPY THORACOLUMBAR TREATMENT    Patient Name: Monique Gonzalez MRN: [id number] DOB:September 05, 1955, 67 y.o., female Today's Date: 11/06/2022  END OF SESSION:  PT End of Session - 11/06/22 1319     Visit Number 18    Number of Visits 24    Date for PT Re-Evaluation 11/25/22    Authorization Type medicare & [employer] of [place]    Progress Note Due on Visit 20    PT Start Time 1315    PT Stop Time 1400    PT Time Calculation (min) 45 min    Activity Tolerance Patient tolerated treatment well              Past Medical History:  Diagnosis Date   Allergy    Anxiety    Phreesia 06/04/2020   Arthritis    Phreesia 07/08/2020   Chronic pain    back and knee   Colitis 09/2012   infectious vs inflammatory.    Colon stricture [facility])    Depression    Depression    Phreesia 06/04/2020   Depression    Phreesia 07/08/2020   GERD (gastroesophageal reflux disease)    Phreesia 06/04/2020   Hyperlipidemia    Obesity    Osteoarthritis    Osteopenia    Recurrent cold sores    Seasonal allergies    Syncope and collapse 11/30/2012   Normal EEG.  Vaso vagal syncope   Past Surgical History:  Procedure Laterality Date   BIOPSY  02/21/2020   Procedure: BIOPSY;  Surgeon: Iva Boop, MD;  Location: [facility] ENDOSCOPY;  Service: Endoscopy;;   COLON RESECTION N/A 10/14/2013   Procedure: LAPAROSCOPIC MOBILIZATION OF SPLENIC FLEXURE, LAPAROSCOPIC EXTENDED LEFT COLECTOMY;  Surgeon: Atilano Ina, MD;  Location: [facility] OR;  Service: General;  Laterality: N/A;   COLON SURGERY N/A    Phreesia 06/04/2020   COLONOSCOPY N/A 10/12/2013   Procedure: COLONOSCOPY;  Surgeon: Beverley Fiedler, MD;  Location: [facility] ENDOSCOPY;  Service: Endoscopy;  Laterality: N/A;   COLONOSCOPY WITH PROPOFOL N/A 02/21/2020   Procedure: COLONOSCOPY WITH PROPOFOL;  Surgeon: Iva Boop, MD;  Location: [facility] ENDOSCOPY;  Service: Endoscopy;  Laterality: N/A;   DILATION AND CURETTAGE, DIAGNOSTIC / THERAPEUTIC  1987    OPEN REDUCTION INTERNAL FIXATION (ORIF) DISTAL RADIAL FRACTURE Right 08/08/2014   Procedure: OPEN REDUCTION INTERNAL FIXATION (ORIF) DISTAL RADIAL FRACTURE;  Surgeon: Dominica Severin, MD;  Location: [facility] OR;  Service: Orthopedics;  Laterality: Right;  DVR Crosslock, MD available sometime around 1430-1500   ORIF ANKLE FRACTURE Right 11/10/2019   Procedure: POSSIBLEOPEN REDUCTION INTERNAL FIXATION (ORIF) ANKLE FRACTURE;  Surgeon: Roby Lofts, MD;  Location: [facility] OR;  Service: Orthopedics;  Laterality: Right;   ORIF WRIST FRACTURE Left 11/10/2019   Procedure: OPEN REDUCTION INTERNAL FIXATION (ORIF) WRIST FRACTURE;  Surgeon: Roby Lofts, MD;  Location: [facility] OR;  Service: Orthopedics;  Laterality: Left;   TONSILLECTOMY     TOTAL KNEE ARTHROPLASTY Right 08/05/2016   Procedure: RIGHT TOTAL KNEE ARTHROPLASTY;  Surgeon: Kathryne Hitch, MD;  Location: [facility] OR;  Service: Orthopedics;  Laterality: Right;   Patient Active Problem List   Diagnosis Date Noted   Lumbar radiculopathy 09/26/2022   Spondylolisthesis of lumbar region 07/22/2022   Status post lumbar spinal fusion 07/22/2022   Ischemic stricture intestine (HCC)    Colitis 02/19/2020   Closed fracture of left distal radius 11/10/2019   Fall 11/10/2019   Closed right ankle fracture 11/09/2019   Ankle fracture 11/09/2019  Closed displaced fracture of styloid process of left ulna    Anxiety and depression 12/22/2016   Insulin resistance 10/15/2016   Presence of right artificial knee joint 10/14/2016   Vitamin D deficiency 04/08/2016   Constipation 09/20/2013   Chronic idiopathic constipation 07/05/2013   Recurrent cold sores    Osteoarthritis    Depression    Hyperlipidemia     PCP: Dr Terance Ice  REFERRING PROVIDER: Dr Lisbeth Renshaw  REFERRING DIAG: lumbar spinal stenosis  Rationale for Evaluation and Treatment: Rehabilitation  THERAPY DIAG:  Chronic bilateral low back pain with right-sided sciatica  Other  symptoms and signs involving the musculoskeletal system  Muscle spasm of back  Difficulty in walking, not elsewhere classified  Muscle weakness (generalized)  Cramp and spasm  ONSET DATE: 07/22/22  SUBJECTIVE:                                                                                                                                                                                           SUBJECTIVE STATEMENT: Patient reports that she had "a little bit" of pain in the LB yesterday but it resolved with some stretches and OTC antiinflammatory meds. Her L knee is still irritated today and that probably causes a limp which irritates the back. No more episodes of dizziness or feeling faint. The cooling wrap has eliminated vasovagal response which seems to occur when she gets hot. She has cardiologist scheduled for !0/3/24. She is no longer using the lumbar support. She is working on some exercises at home and riding the elliptical bike without difficulty and is riding  the bike for 40 min. She is no longer using the cane. Orthopedist gave her an injection in the L knee a few weeks and started meloxacam which has helped the knee.   PERTINENT HISTORY:  LBP for 10 years on and off with significant pain in the past 1.5 years; arthritis; R TKA; L knee pain; fx bilat wrists ORIF; bowel resection; idiopathic constipation; depression; obesity  PAIN:  Are you having pain? Yes: NPRS scale: 0/10  Pain location: lower back; R side of spine  Pain description: aching; burning; muscle  Aggravating factors: walking Relieving factors: sitting; lying in the bed; muscle relaxer   PRECAUTIONS: post op lumbar fusion- to remain in lumbar brace until RTD 10/06/22; out of brace to sleep; no lifting; no bending hands past knees   WEIGHT BEARING RESTRICTIONS: No  FALLS:  Has patient fallen in last 6 months? 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   PATIENT SURVEYS:  FOTO 25; goal 42 10/08/22 - 40    SENSATION: WFL  MUSCLE LENGTH: Hamstrings: Right 75 deg; Left 70 deg Thomas test: tight bilat   POSTURE: rounded shoulders, forward head, and flexed trunk   PALPATION: Muscular tightness noted through hip flexors bilat; Rt thoracolumbar paraspinals  (Patient in lumbar brace limiting palpation)   LUMBAR ROM:   AROM eval 10/08/22  Flexion Fingers to knees  Fingers to ankles   Extension Neutral 20%   Right lateral flexion 60% pulling pain L LB 80%  Left lateral flexion 65% cramping L LB 80%  Right rotation NT 50%  Left rotation NT 50%   (Blank rows = not tested)  LOWER EXTREMITY ROM:  hip mobility WFL's except hip extension which is limited   (ROM generally limited by obesity)  Active  Right eval Left eval  Hip flexion    Hip extension    Hip abduction    Hip adduction    Hip internal rotation    Hip external rotation    Knee flexion    Knee extension    Ankle dorsiflexion    Ankle plantarflexion    Ankle inversion    Ankle eversion     (Blank rows = not tested)  LOWER EXTREMITY MMT:  assessed in sitting and supine - patient unable to lie sidelying or prone     10/08/22: tested in sidelying and supine   MMT Right eval 10/08/22 Left eval 10/08/22  Hip flexion 4 5 4  5-  Hip extension 4- 5- 4- 5-  Hip abduction 4 5- 4 4+  Hip adduction      Hip internal rotation      Hip external rotation      Knee flexion 5  5   Knee extension 5  5    Ankle dorsiflexion      Ankle plantarflexion      Ankle inversion      Ankle eversion       (Blank rows = not tested)  LUMBAR SPECIAL TESTS:  Straight leg raise test: Negative and Slump test: Negative  FUNCTIONAL TESTS:  5 times sit to stand: 18.46 sec using UE's to move sit to stand; reports cramping R side       10/08/22 - 14.28 sec  GAIT: Distance walked: 40 Assistive device utilized: no assistive Level of assistance: Complete Independence Comments: wide based gait; flexed forward at hips; limp R LE during Wt bearing R    [facility] Adult PT Treatment:                                                DATE: 11/05/22 Therapeutic Exercise: NuStep L7 x 8 min Standing  Row blue TB 3 sec x 10 x 2  Shoulder extension blue TB 3 sec x 10 x 2  Bow and arrow blue TB 3 sec x 10 R/L Antirotation blue TB R/L 3 sec x 10  Wall squat 10 sec x10 Supine  Ball btn knees to overhead holding core tight x 10  Rolling ball into hip/knee flexion x 10  Alternating hip abduction blue TB R/L 3 sec x 20 Bridging with ball squeeze 3-5 sec x 10 x 2 Hip adduction w/ball squeeze 3 sec x 10 x 2 Bilat shoulder horizontal abduction red TB 3 sec x 10 x 2  Diagonal red TB R/L x 10 x 2 Shoulder flexion red TB btn hands raising arms overhead x 10 x 2 Hips flexed, LE's table top 25 sec x 2; 30 sec x 30  Hip flexion and extension in hooklying with core tight R/L x 10  Sitting Sitting arm angel x 10  Sitting backward shoulder rolls x 10  Sitting lateral cervical flexion 5 sec x 3   Gait - hold due to knee pain Walking step stop to work on gait pattern and balance  Backward walking    Will add back as tolerated: Sit to stand holding 5# wt x 10 hold due to knee pain Back to wall - lifting hips from wall, keeping knees straight  5 sec x 10 Hip abduction leading with heel UE support as needed for balance 10 R/L x 2 sets  Standing facing wall hip extension x 10 L/R    [facility] Adult PT Treatment:                                                 DATE: 11/03/22 Therapeutic Exercise: NuStep L7 x 7 min Standing  Row blue TB 3 sec x 10 x 2  Shoulder extension blue TB 3 sec x 10 x 2  Antirotation blue TB R/L 3 sec x 10 x2 Supine  Ball btn knees to overhead holding core tight x 10  Rolling ball into hip/knee flexion x 10  Alternating hip abduction blue TB R/L 3 sec x 20 Bridging with hip abduction blue TB 3-5 sec x 10 x 2 Hip adduction w/ball squeeze 3 sec x 10 x 2 Bilat shoulder horizontal abduction red TB 3 sec x 10 x 2  Diagonal red TB R/L x 10 x 2 Shoulder flexion red TB btn hands raising arms overhead x 10 x 2 Hip flexion and extension in hooklying with core tight R/L x 10  Sitting Sit to stand holding 5# wt x 10 hold due to knee pain Sitting arm angel x 10  Sitting backward shoulder rolls x 10  Sitting lateral cervical flexion 5 sec x 3   Gait - hold due to knee pain Walking step stop to work on gait pattern and balance  Backward walking    Will add back as tolerated: Back to wall - lifting hips from wall, keeping knees straight  5 sec x 10 Hip abduction leading with heel UE support as needed for balance 10 R/L x 2 sets  Standing facing wall hip extension x 10 L/R    PATIENT EDUCATION:  Education details: Updated HEP  Person educated: Patient Education method: Explanation, Demonstration, Tactile cues, Verbal cues, and Handouts Education comprehension: verbalized understanding, returned demonstration, verbal cues required, tactile cues required, and needs further education  HOME EXERCISE PROGRAM:  Access Code: [email or web address] URL: [email or web address] Date: 10/30/2022 Prepared by: Corlis Leak  Exercises - Supine Posterior Pelvic Tilt  - 1 x daily - 7 x weekly - 3 sets -  10 reps - Supine Single Knee to Chest Stretch  - 1 x daily - 7 x weekly - 1 sets - 3-5 reps - 10-30 sec hold - Supine Hip Adduction Isometric with Ball  - 1 x daily - 7 x weekly - 1 sets - 5 reps - Supine Bridge  with Mini Swiss Ball Between Knees  - 1 x daily - 7 x weekly - 3 sets - 10 reps - 3 sec hold - Supine Hamstring Stretch with Strap  - 1 x daily - 7 x weekly - 1 sets - 3-5 reps - 30 sec hold - Supine ITB Stretch with Strap  - 1 x daily - 7 x weekly - 1 sets - 3-5 reps - 30 sec hold - Seated Quadratus Lumborum Stretch with Forward Bend  - 1 x daily - 7 x weekly - 2 sets - 10 reps - 5-10 sec hold - Seated Flexion Stretch with Swiss Ball  - 1 x daily - 7 x weekly - 2 sets - 10 reps - 5-10 sec hold - Seated Single Leg Hip Abduction with Resistance  - 1 x daily - 7 x weekly - 3 sets - 10 reps - Side Stepping with Resistance at Ankles and Counter Support  - 1 x daily - 7 x weekly - 3 sets - 10 reps - Squat with Chair Touch  - 1 x daily - 7 x weekly - 3 sets - 10 reps - Supine Piriformis Stretch with Leg Straight  - 1 x daily - 7 x weekly - 1 sets - 3 reps - 30 sec  hold - Hooklying Isometric Clamshell  - 1 x daily - 7 x weekly - 1 sets - 10 reps - 3 sec  hold - Standing Bilateral Low Shoulder Row with Anchored Resistance  - 2 x daily - 7 x weekly - 1-3 sets - 10 reps - 2-3 sec  hold - Shoulder extension with resistance - Neutral  - 1 x daily - 7 x weekly - 1-2 sets - 10 reps - 3-5 sec  hold - Hooklying Shoulder I  - 1 x daily - 7 x weekly - 1 sets - 10 reps - 3 sec  hold - Shoulder Flexion Wall Slide with Towel  - 1 x daily - 7 x weekly - 1-2 sets - 10 reps - 3 sec  hold - Wall Push Up  - 1 x daily - 7 x weekly - 1-3 sets - 10 reps - 3 sec  hold - Standing Transverse Abdominis Contraction  - 1 x daily - 7 x weekly - 3 sets - 10 reps - Supine March with Posterior Pelvic Tilt  - 1 x daily - 7 x weekly - 3 sets - 10 reps - Standing Hip Hiking  - 1 x daily - 7 x weekly - 3 sets - 10 reps - Standing Hip Extension with Counter Support  - 1 x daily - 7 x weekly - 1-2 sets - 10 reps - 3-5 sec  hold - Wall Angels  - 1 x daily - 7 x weekly - 1-3 sets - 10 reps - 2-3 sec  hold - Anti-Rotation Lateral Stepping  with Press  - 1 x daily - 7 x weekly - 1-2 sets - 10 reps - 2-3 sec  hold - Drawing Bow  - 1 x daily - 7 x weekly - 1 sets - 10 reps - 3 sec  hold  ASSESSMENT:  CLINICAL IMPRESSION: Some continued  pain in the L knee with increased physical activity. Limp on L LE likely irritates LBP. Continued exercises in standing/ walking; supine and sitting. Patient tolerated exercise well. Patient is using a cool wrap on her head for exercises which does seem to help her with increased exercise tolerance. Continued work with core stabilization and strengthening. Continued exercises today without an episode of feeling faint.    GOALS: Goals reviewed with patient? Yes  SHORT TERM GOALS: Target date: 10/14/2022  Independent in initial HEP Baseline: Goal status: met  2.  Patient reports/demonstrates ability to stand for 5-10 min for light household and grooming tasks  Baseline:  Goal status: met  3.  Independent in gait with least restrictive assistive device for 500 feet without back spasm  Baseline:  Goal status: met   LONG TERM GOALS: Target date: 11/25/2022  Increase core strength and stability with patient to demonstrate ability to participate in 20 min of exercise without increase in symptoms  Baseline:  Goal status: on going   2.  4+/5 to 5/5 strength bilat LE's  Baseline:  Goal status: partially met   3.  Decrease sit to stand times test by 3-5 seconds  Baseline:  Goal status: met  4.  Patient demonstrates/reports ability to ambulate community distances for functional tasks with least restrictive assistive device (at least 1000 feet)  Baseline:  Goal status: on going   5.  Patient reports ability to do laundry at home  Baseline: doing dishes Goal status: partially accomplished  6.  Independent in HEP including aquatic program as indicated  Baseline:  Goal status: on going   7.  Improve functional limitation score to 42  Baseline: 25  10/08/22: 40  Goal status: partially  accomplished   PLAN:  PT FREQUENCY: 2x/week  PT DURATION: 12 weeks  PLANNED INTERVENTIONS: Therapeutic exercises, Therapeutic activity, Neuromuscular re-education, Balance training, Gait training, Patient/Family education, Self Care, Joint mobilization, Aquatic Therapy, Dry Needling, Electrical stimulation, Spinal mobilization, Cryotherapy, Moist heat, Taping, Ultrasound, Ionotophoresis 4mg /ml Dexamethasone, Manual therapy, and Re-evaluation.  PLAN FOR NEXT SESSION: No side lying or quadruped (too painful). Lifting mechanics, progress exercises as toerated; continue spine care and body mechanics education; manual work, DN, modalities as indicated. Balance as time allows   Toinette Lackie P. Leonor Liv PT, MPH 11/06/22 1:20 PM

## 2022-11-12 ENCOUNTER — Ambulatory Visit: Payer: Medicare Other | Admitting: Rehabilitative and Restorative Service Providers"

## 2022-11-14 ENCOUNTER — Encounter: Payer: Self-pay | Admitting: Rehabilitative and Restorative Service Providers"

## 2022-11-18 ENCOUNTER — Encounter: Payer: Self-pay | Admitting: Rehabilitative and Restorative Service Providers"

## 2022-11-18 ENCOUNTER — Ambulatory Visit: Payer: Medicare Other | Attending: Neurosurgery | Admitting: Rehabilitative and Restorative Service Providers"

## 2022-11-18 DIAGNOSIS — M62830 Muscle spasm of back: Secondary | ICD-10-CM | POA: Diagnosis present

## 2022-11-18 DIAGNOSIS — M6281 Muscle weakness (generalized): Secondary | ICD-10-CM | POA: Diagnosis present

## 2022-11-18 DIAGNOSIS — R29898 Other symptoms and signs involving the musculoskeletal system: Secondary | ICD-10-CM | POA: Diagnosis present

## 2022-11-18 DIAGNOSIS — R262 Difficulty in walking, not elsewhere classified: Secondary | ICD-10-CM

## 2022-11-18 DIAGNOSIS — G8929 Other chronic pain: Secondary | ICD-10-CM | POA: Diagnosis present

## 2022-11-18 DIAGNOSIS — M5441 Lumbago with sciatica, right side: Secondary | ICD-10-CM | POA: Diagnosis present

## 2022-11-18 NOTE — Therapy (Signed)
OUTPATIENT PHYSICAL THERAPY THORACOLUMBAR TREATMENT    Patient Name: LEVONIA KNOBLOCK MRN: [id number] DOB:07/23/1955, 67 y.o., female Today's Date: 11/18/2022  END OF SESSION:  PT End of Session - 11/18/22 1328     Visit Number 18    Number of Visits 24    Date for PT Re-Evaluation 11/25/22    Authorization Type medicare & mutual of [place]    Progress Note Due on Visit 20    PT Start Time 1315    PT Stop Time 1400    PT Time Calculation (min) 45 min    Activity Tolerance Patient tolerated treatment well              Past Medical History:  Diagnosis Date   Allergy    Anxiety    Phreesia 06/04/2020   Arthritis    Phreesia 07/08/2020   Chronic pain    back and knee   Colitis 09/2012   infectious vs inflammatory.    Colon stricture [facility])    Depression    Depression    Phreesia 06/04/2020   Depression    Phreesia 07/08/2020   GERD (gastroesophageal reflux disease)    Phreesia 06/04/2020   Hyperlipidemia    Obesity    Osteoarthritis    Osteopenia    Recurrent cold sores    Seasonal allergies    Syncope and collapse 11/30/2012   Normal EEG.  Vaso vagal syncope   Past Surgical History:  Procedure Laterality Date   BIOPSY  02/21/2020   Procedure: BIOPSY;  Surgeon: Iva Boop, MD;  Location: [facility] ENDOSCOPY;  Service: Endoscopy;;   COLON RESECTION N/A 10/14/2013   Procedure: LAPAROSCOPIC MOBILIZATION OF SPLENIC FLEXURE, LAPAROSCOPIC EXTENDED LEFT COLECTOMY;  Surgeon: Atilano Ina, MD;  Location: [facility] OR;  Service: General;  Laterality: N/A;   COLON SURGERY N/A    Phreesia 06/04/2020   COLONOSCOPY N/A 10/12/2013   Procedure: COLONOSCOPY;  Surgeon: Beverley Fiedler, MD;  Location: [facility] ENDOSCOPY;  Service: Endoscopy;  Laterality: N/A;   COLONOSCOPY WITH PROPOFOL N/A 02/21/2020   Procedure: COLONOSCOPY WITH PROPOFOL;  Surgeon: Iva Boop, MD;  Location: [facility] ENDOSCOPY;  Service: Endoscopy;  Laterality: N/A;   DILATION AND CURETTAGE, DIAGNOSTIC / THERAPEUTIC  1987    OPEN REDUCTION INTERNAL FIXATION (ORIF) DISTAL RADIAL FRACTURE Right 08/08/2014   Procedure: OPEN REDUCTION INTERNAL FIXATION (ORIF) DISTAL RADIAL FRACTURE;  Surgeon: Dominica Severin, MD;  Location: [facility] OR;  Service: Orthopedics;  Laterality: Right;  DVR Crosslock, MD available sometime around 1430-1500   ORIF ANKLE FRACTURE Right 11/10/2019   Procedure: POSSIBLEOPEN REDUCTION INTERNAL FIXATION (ORIF) ANKLE FRACTURE;  Surgeon: Roby Lofts, MD;  Location: [facility] OR;  Service: Orthopedics;  Laterality: Right;   ORIF WRIST FRACTURE Left 11/10/2019   Procedure: OPEN REDUCTION INTERNAL FIXATION (ORIF) WRIST FRACTURE;  Surgeon: Roby Lofts, MD;  Location: [facility] OR;  Service: Orthopedics;  Laterality: Left;   TONSILLECTOMY     TOTAL KNEE ARTHROPLASTY Right 08/05/2016   Procedure: RIGHT TOTAL KNEE ARTHROPLASTY;  Surgeon: Kathryne Hitch, MD;  Location: [facility] OR;  Service: Orthopedics;  Laterality: Right;   Patient Active Problem List   Diagnosis Date Noted   Lumbar radiculopathy 09/26/2022   Spondylolisthesis of lumbar region 07/22/2022   Status post lumbar spinal fusion 07/22/2022   Ischemic stricture intestine (HCC)    Colitis 02/19/2020   Closed fracture of left distal radius 11/10/2019   Fall 11/10/2019   Closed right ankle fracture 11/09/2019   Ankle fracture 11/09/2019  Closed displaced fracture of styloid process of left ulna    Anxiety and depression 12/22/2016   Insulin resistance 10/15/2016   Presence of right artificial knee joint 10/14/2016   Vitamin D deficiency 04/08/2016   Constipation 09/20/2013   Chronic idiopathic constipation 07/05/2013   Recurrent cold sores    Osteoarthritis    Depression    Hyperlipidemia     PCP: Dr Terance Ice  REFERRING PROVIDER: Dr Lisbeth Renshaw  REFERRING DIAG: lumbar spinal stenosis  Rationale for Evaluation and Treatment: Rehabilitation  THERAPY DIAG:  Chronic bilateral low back pain with right-sided sciatica  Other  symptoms and signs involving the musculoskeletal system  Muscle spasm of back  Difficulty in walking, not elsewhere classified  Muscle weakness (generalized)  ONSET DATE: 07/22/22  SUBJECTIVE:                                                                                                                                                                                           SUBJECTIVE STATEMENT: Patient reports that she is working on her exercises on a daily basis and has increased her functional activities. She is confident in continuing with her exercises independently.    PERTINENT HISTORY:  LBP for 10 years on and off with significant pain in the past 1.5 years; arthritis; R TKA; L knee pain; fx bilat wrists ORIF; bowel resection; idiopathic constipation; depression; obesity  PAIN:  Are you having pain? Yes: NPRS scale: 0/10  Pain location: lower back; R side of spine  Pain description: aching; burning; muscle  Aggravating factors: walking Relieving factors: sitting; lying in the bed; muscle relaxer   PRECAUTIONS: post op lumbar fusion- to remain in lumbar brace until RTD 10/06/22; out of brace to sleep; no lifting; no bending hands past knees   WEIGHT BEARING RESTRICTIONS: No  FALLS:  Has patient fallen in last 6 months? 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   PATIENT SURVEYS:  FOTO 25; goal 42 10/08/22 -  40    SENSATION: WFL  MUSCLE LENGTH: Hamstrings: Right 75 deg; Left 70 deg Thomas test: tight bilat  POSTURE: rounded shoulders, forward head, and flexed trunk   PALPATION: Muscular tightness noted through hip flexors bilat; Rt thoracolumbar paraspinals  (Patient in lumbar brace limiting palpation)   LUMBAR ROM:   AROM eval 10/08/22 11/18/22  Flexion Fingers to knees  Fingers to ankles  Fingers to toes  Extension Neutral 20%  40%  Right lateral flexion 60% pulling pain L LB 80% 85%  Left lateral flexion 65% cramping L LB 80% 85%  Right rotation NT 50% 70%  Left rotation NT 50% 70%   (Blank rows = not tested)  LOWER EXTREMITY ROM:  hip mobility WFL's except hip extension which is limited   (ROM generally limited by obesity)   Active  Right eval Left eval  Hip flexion    Hip extension    Hip abduction    Hip adduction    Hip internal rotation    Hip external rotation    Knee flexion    Knee extension    Ankle dorsiflexion    Ankle plantarflexion    Ankle inversion    Ankle eversion     (Blank rows = not tested)  LOWER EXTREMITY MMT:  assessed in sitting and supine - patient unable to lie sidelying or prone     10/08/22: tested in sidelying and supine   MMT Right eval 10/08/22 Left eval 10/08/22 11/18/22 left 11/18/22 Right   Hip flexion 4 5 4  5- 5- 5  Hip extension 4- 5- 4- 5- 4 5-  Hip abduction 4 5- 4 4+ 4+ 5-  Hip adduction        Hip internal rotation        Hip external rotation        Knee flexion 5  5  5 5   Knee extension 5  5  5 5   Ankle dorsiflexion        Ankle plantarflexion        Ankle inversion        Ankle eversion         (Blank rows = not tested)  LUMBAR SPECIAL TESTS:  Straight leg raise test: Negative and Slump test: Negative  FUNCTIONAL TESTS:  5 times sit to stand: 18.46 sec using UE's to move sit to stand; reports cramping R side       10/08/22 - 14.28 sec  GAIT: Distance walked: 40 Assistive device utilized: no  assistive Level of assistance: Complete Independence Comments: wide based gait; flexed forward at hips; limp R LE during Wt bearing R    [facility] Adult PT Treatment:                                                DATE: 11/18/22 Therapeutic Exercise: NuStep L7 x 8 min Standing  Row blue TB 3 sec x 10 x 2  Shoulder extension blue TB 3 sec x 10 x 2  Bow and arrow blue TB 3 sec x 10 R/L Antirotation blue TB R/L 3 sec x 10  Wall squat 10 sec x10 Supine  Ball btn knees to overhead holding core tight x 10  Rolling ball into hip/knee flexion x 10  Alternating hip abduction blue TB R/L 3 sec x 20 Bridging with ball squeeze 3-5 sec x 10 x 2 Hip adduction w/ball squeeze 3 sec x 10 x 2 Bilat shoulder horizontal abduction red TB 3 sec  x 10 x 2  Diagonal red TB R/L x 10 x 2 Shoulder flexion red TB btn hands raising arms overhead x 10 x 2 Hips flexed, LE's table top 25 sec x 2; 30 sec x 30  Hip flexion and extension in hooklying with core tight R/L x 10  Sitting Sitting arm angel x 10  Sitting backward shoulder rolls x 10  Sitting lateral cervical flexion 5 sec x 3   Gait - hold due to knee pain Walking step stop to work on gait pattern and balance  Backward walking    Will add back as tolerated: Sit to stand holding 5# wt x 10 hold due to knee pain Back to wall - lifting hips from wall, keeping knees straight  5 sec x 10 Hip abduction leading with heel UE support as needed for balance 10 R/L x 2 sets  Standing facing wall hip extension x 10 L/R   [facility] Adult PT Treatment:                                                DATE: 11/05/22 Therapeutic Exercise: NuStep L7 x 8 min Standing  Row blue TB 3 sec x 10 x 2  Shoulder extension blue TB 3 sec x 10 x 2  Bow and arrow blue TB 3 sec x 10 R/L Antirotation blue TB R/L 3 sec x 10  Wall squat 10 sec x10 Supine  Ball btn knees to overhead holding core tight x 10  Rolling ball into hip/knee flexion x 10  Alternating hip abduction blue TB R/L 3  sec x 20 Bridging with ball squeeze 3-5 sec x 10 x 2 Hip adduction w/ball squeeze 3 sec x 10 x 2 Bilat shoulder horizontal abduction red TB 3 sec x 10 x 2  Diagonal red TB R/L x 10 x 2 Shoulder flexion red TB btn hands raising arms overhead x 10 x 2 Hips flexed, LE's table top 25 sec x 2; 30 sec x 30  Hip flexion and extension in hooklying with core tight R/L x 10  Sitting Sitting arm angel x 10  Sitting backward shoulder rolls x 10  Sitting lateral cervical flexion 5 sec x 3   Gait - hold due to knee pain Walking step stop to work on gait pattern and balance  Backward walking    Will add back as tolerated: Sit to stand holding 5# wt x 10 hold due to knee pain Back to wall - lifting hips from wall, keeping knees straight  5 sec x 10 Hip abduction leading with heel UE support as needed for balance 10 R/L x 2 sets  Standing facing wall hip extension x 10 L/R    [facility] Adult PT Treatment:                                                DATE: 11/03/22 Therapeutic Exercise: NuStep L7 x 7 min Standing  Row blue TB 3 sec x 10 x 2  Shoulder extension blue TB 3 sec x 10 x 2  Antirotation blue TB R/L 3 sec x 10 x2 Supine  Ball btn knees to overhead holding core tight x 10  Rolling ball into hip/knee flexion  x 10  Alternating hip abduction blue TB R/L 3 sec x 20 Bridging with hip abduction blue TB 3-5 sec x 10 x 2 Hip adduction w/ball squeeze 3 sec x 10 x 2 Bilat shoulder horizontal abduction red TB 3 sec x 10 x 2  Diagonal red TB R/L x 10 x 2 Shoulder flexion red TB btn hands raising arms overhead x 10 x 2 Hip flexion and extension in hooklying with core tight R/L x 10  Sitting Sit to stand holding 5# wt x 10 hold due to knee pain Sitting arm angel x 10  Sitting backward shoulder rolls x 10  Sitting lateral cervical flexion 5 sec x 3   Gait - hold due to knee pain Walking step stop to work on gait pattern and balance  Backward walking    Will add back as tolerated: Back to wall  - lifting hips from wall, keeping knees straight  5 sec x 10 Hip abduction leading with heel UE support as needed for balance 10 R/L x 2 sets  Standing facing wall hip extension x 10 L/R    PATIENT EDUCATION:  Education details: Updated HEP  Person educated: Patient Education method: Explanation, Demonstration, Tactile cues, Verbal cues, and Handouts Education comprehension: verbalized understanding, returned demonstration, verbal cues required, tactile cues required, and needs further education  HOME EXERCISE PROGRAM:  Access Code: [email or web address] URL: [email or web address] Date: 11/18/2022 Prepared by: Corlis Leak  Exercises - Supine Single Knee to Chest Stretch  - 1 x daily - 7 x weekly - 1 sets - 3-5 reps - 10-30 sec hold - Supine Hip Adduction Isometric with Ball  - 1 x daily - 7 x weekly - 1 sets - 5 reps - Supine Bridge with Mini Swiss Ball Between Knees  - 1 x daily - 7 x weekly - 3 sets - 10 reps - 3 sec hold - Supine Hamstring Stretch with Strap  - 1 x daily - 7 x weekly - 1 sets - 3-5 reps - 30 sec hold - Supine ITB Stretch with Strap  - 1 x daily - 7 x weekly - 1 sets - 3-5 reps - 30 sec hold - Seated Quadratus Lumborum Stretch with Forward Bend  - 1 x daily - 7 x weekly - 2 sets - 10 reps - 5-10 sec hold - Seated Flexion Stretch with Swiss Ball  - 1 x daily - 7 x weekly - 2 sets - 10 reps - 5-10 sec hold - Side Stepping with Resistance at Ankles and Counter Support  - 1 x daily - 7 x weekly - 3 sets - 10 reps - Squat with Chair Touch  - 1 x daily - 7 x weekly - 3 sets - 10 reps - Supine Piriformis Stretch with Leg Straight  - 1 x daily - 7 x weekly - 1 sets - 3 reps - 30 sec  hold - Hooklying Isometric Clamshell  - 1 x daily - 7 x weekly - 1 sets - 10 reps - 3 sec  hold - Standing Bilateral Low Shoulder Row with Anchored Resistance  - 2 x daily - 7 x weekly - 1-3 sets - 10 reps - 2-3 sec  hold - Shoulder extension with resistance - Neutral  - 1 x daily - 7 x weekly -  1-2 sets - 10 reps - 3-5 sec  hold - Hooklying Shoulder I  - 1 x daily - 7 x weekly - 1 sets - 10 reps - 3  sec  hold - Shoulder Flexion Wall Slide with Towel  - 1 x daily - 7 x weekly - 1-2 sets - 10 reps - 3 sec  hold - Wall Push Up  - 1 x daily - 7 x weekly - 1-3 sets - 10 reps - 3 sec  hold - Standing Transverse Abdominis Contraction  - 1 x daily - 7 x weekly - 3 sets - 10 reps - Supine March with Posterior Pelvic Tilt  - 1 x daily - 7 x weekly - 3 sets - 10 reps - Standing Hip Hiking  - 1 x daily - 7 x weekly - 3 sets - 10 reps - Standing Hip Extension with Counter Support  - 1 x daily - 7 x weekly - 1-2 sets - 10 reps - 3-5 sec  hold - Wall Angels  - 1 x daily - 7 x weekly - 1-3 sets - 10 reps - 2-3 sec  hold - Anti-Rotation Lateral Stepping with Press  - 1 x daily - 7 x weekly - 1-2 sets - 10 reps - 2-3 sec  hold - Drawing Bow  - 1 x daily - 7 x weekly - 1 sets - 10 reps - 3 sec  hold - Sidelying Hip Abduction  - 1 x daily - 7 x weekly - 3 sets - 10 reps - 3-5 sec  hold - Supine Transversus Abdominis Bracing with Pelvic Floor Contraction  - 2 x daily - 7 x weekly - 1 sets - 10 reps - 10sec  hold  ASSESSMENT:  CLINICAL IMPRESSION: Some continued pain in the L knee with increased physical activity. Limp on L LE likely irritates LBP. Continued exercises in standing/ walking; supine and sitting. Patient tolerated exercise well. Goals of therapy have been accomplished. Patient is comfortable with continuing with independent HEP and will call with any questions or problems.    GOALS: Goals reviewed with patient? Yes  SHORT TERM GOALS: Target date: 10/14/2022  Independent in initial HEP Baseline: Goal status: met  2.  Patient reports/demonstrates ability to stand for 5-10 min for light household and grooming tasks  Baseline:  Goal status: met  3.  Independent in gait with least restrictive assistive device for 500 feet without back spasm  Baseline:  Goal status: met   LONG TERM  GOALS: Target date: 11/25/2022  Increase core strength and stability with patient to demonstrate ability to participate in 20 min of exercise without increase in symptoms  Baseline:  Goal status: met   2.  4+/5 to 5/5 strength bilat LE's  Baseline:  Goal status:  met   3.  Decrease sit to stand times test by 3-5 seconds  Baseline:  Goal status: met  4.  Patient demonstrates/reports ability to ambulate community distances for functional tasks with least restrictive assistive device (at least 1000 feet)  Baseline:  Goal status: met   5.  Patient reports ability to do laundry at home  Baseline: doing dishes and putting dishes away; doing laundry  Goal status: met  6.  Independent in HEP including aquatic program as indicated  Baseline:  Goal status: met  7.  Improve functional limitation score to 42  Baseline: 25  10/08/22: 40  11/18/22: 67  Goal status: met  PLAN:  PT FREQUENCY: 2x/week  PT DURATION: 12 weeks  PLANNED INTERVENTIONS: Therapeutic exercises, Therapeutic activity, Neuromuscular re-education, Balance training, Gait training, Patient/Family education, Self Care, Joint mobilization, Aquatic Therapy, Dry Needling, Electrical stimulation, Spinal mobilization, Cryotherapy, Moist  heat, Taping, Ultrasound, Ionotophoresis 4mg /ml Dexamethasone, Manual therapy, and Re-evaluation.  PLAN FOR NEXT SESSION: patient will be on hold and continue with independent HEP - she will call with any questions or problems   Shawniece Oyola P. Leonor Liv PT, MPH 11/18/22 1:29 PM

## 2022-11-20 ENCOUNTER — Ambulatory Visit: Payer: Medicare Other | Admitting: Rehabilitative and Restorative Service Providers"

## 2022-12-08 DIAGNOSIS — M1990 Unspecified osteoarthritis, unspecified site: Secondary | ICD-10-CM | POA: Insufficient documentation

## 2022-12-08 DIAGNOSIS — K219 Gastro-esophageal reflux disease without esophagitis: Secondary | ICD-10-CM | POA: Insufficient documentation

## 2022-12-08 DIAGNOSIS — J302 Other seasonal allergic rhinitis: Secondary | ICD-10-CM | POA: Insufficient documentation

## 2022-12-08 DIAGNOSIS — M8580 Other specified disorders of bone density and structure, unspecified site: Secondary | ICD-10-CM | POA: Insufficient documentation

## 2022-12-08 DIAGNOSIS — F419 Anxiety disorder, unspecified: Secondary | ICD-10-CM | POA: Insufficient documentation

## 2022-12-08 DIAGNOSIS — R55 Syncope and collapse: Secondary | ICD-10-CM | POA: Insufficient documentation

## 2022-12-08 DIAGNOSIS — T7840XA Allergy, unspecified, initial encounter: Secondary | ICD-10-CM | POA: Insufficient documentation

## 2022-12-08 DIAGNOSIS — E669 Obesity, unspecified: Secondary | ICD-10-CM | POA: Insufficient documentation

## 2022-12-08 DIAGNOSIS — G8929 Other chronic pain: Secondary | ICD-10-CM | POA: Insufficient documentation

## 2022-12-11 ENCOUNTER — Ambulatory Visit: Payer: Medicare Other

## 2022-12-11 VITALS — BP 138/82 | HR 98 | Ht 70.0 in | Wt 289.1 lb

## 2022-12-11 DIAGNOSIS — R55 Syncope and collapse: Secondary | ICD-10-CM

## 2022-12-11 NOTE — Patient Instructions (Signed)
Medication Instructions:  Your physician recommends that you continue on your current medications as directed. Please refer to the Current Medication list given to you today.  *If you need a refill on your cardiac medications before your next appointment, please call your pharmacy*   Lab Work: None ordered If you have labs (blood work) drawn today and your tests are completely normal, you will receive your results only by: MyChart Message (if you have MyChart) OR A paper copy in the mail If you have any lab test that is abnormal or we need to change your treatment, we will call you to review the results.   Testing/Procedures: Your physician has requested that you have an echocardiogram. Echocardiography is a painless test that uses sound waves to create images of your heart. It provides your doctor with information about the size and shape of your heart and how well your heart's chambers and valves are working. This procedure takes approximately one hour. There are no restrictions for this procedure. Please do NOT wear cologne, perfume, aftershave, or lotions (deodorant is allowed). Please arrive 15 minutes prior to your appointment time.  A zio monitor was ordered today. It will remain on for 14 days. Remove 12/25/22. You will then return monitor and event diary in provided box. It takes 1-2 weeks for report to be downloaded and returned to [name]. We will call you with the results. If monitor falls off or has orange flashing light, please call Zio for further instructions.   Follow-Up: At [facility], you and your health needs are our priority.  As part of our continuing mission to provide you with exceptional heart care, we have created designated Provider Care Teams.  These Care Teams include your primary Cardiologist (physician) and Advanced Practice Providers (APPs -  Physician Assistants and Nurse Practitioners) who all work together to provide you with the care you need, when you need  it.  We recommend signing up for the patient portal called "MyChart".  Sign up information is provided on this After Visit Summary.  MyChart is used to connect with patients for Virtual Visits (Telemedicine).  Patients are able to view lab/test results, encounter notes, upcoming appointments, etc.  Non-urgent messages can be sent to your provider as well.   To learn more about what you can do with MyChart, go to [email or web address].    Your next appointment:   Based on results  The format for your next appointment:   In Person  Provider:   Vern Claude Madireddy, MD   Other Instructions Echocardiogram An echocardiogram is a test that uses sound waves (ultrasound) to produce images of the heart. Images from an echocardiogram can provide important information about: Heart size and shape. The size and thickness and movement of your heart's walls. Heart muscle function and strength. Heart valve function or if you have stenosis. Stenosis is when the heart valves are too narrow. If blood is flowing backward through the heart valves (regurgitation). A tumor or infectious growth around the heart valves. Areas of heart muscle that are not working well because of poor blood flow or injury from a heart attack. Aneurysm detection. An aneurysm is a weak or damaged part of an artery wall. The wall bulges out from the normal force of blood pumping through the body. Tell a health care provider about: Any allergies you have. All medicines you are taking, including vitamins, herbs, eye drops, creams, and over-the-counter medicines. Any blood disorders you have. Any surgeries you have had. Any medical conditions  you have. Whether you are pregnant or may be pregnant. What are the risks? Generally, this is a safe test. However, problems may occur, including an allergic reaction to dye (contrast) that may be used during the test. What happens before the test? No specific preparation is needed. You may  eat and drink normally. What happens during the test? You will take off your clothes from the waist up and put on a hospital gown. Electrodes or electrocardiogram (ECG)patches may be placed on your chest. The electrodes or patches are then connected to a device that monitors your heart rate and rhythm. You will lie down on a table for an ultrasound exam. A gel will be applied to your chest to help sound waves pass through your skin. A handheld device, called a transducer, will be pressed against your chest and moved over your heart. The transducer produces sound waves that travel to your heart and bounce back (or "echo" back) to the transducer. These sound waves will be captured in real-time and changed into images of your heart that can be viewed on a video monitor. The images will be recorded on a computer and reviewed by your health care provider. You may be asked to change positions or hold your breath for a short time. This makes it easier to get different views or better views of your heart. In some cases, you may receive contrast through an IV in one of your veins. This can improve the quality of the pictures from your heart. The procedure may vary among health care providers and hospitals.   What can I expect after the test? You may return to your normal, everyday life, including diet, activities, and medicines, unless your health care provider tells you not to do that. Follow these instructions at home: It is up to you to get the results of your test. Ask your health care provider, or the department that is doing the test, when your results will be ready. Keep all follow-up visits. This is important. Summary An echocardiogram is a test that uses sound waves (ultrasound) to produce images of the heart. Images from an echocardiogram can provide important information about the size and shape of your heart, heart muscle function, heart valve function, and other possible heart problems. You do  not need to do anything to prepare before this test. You may eat and drink normally. After the echocardiogram is completed, you may return to your normal, everyday life, unless your health care provider tells you not to do that. This information is not intended to replace advice given to you by your health care provider. Make sure you discuss any questions you have with your health care provider. Document Revised: 10/18/2019 Document Reviewed: 10/18/2019 Elsevier Patient Education  2021 [employer]   Important Information About Sugar

## 2022-12-11 NOTE — Progress Notes (Signed)
Cardiology Consultation:    Date:  12/11/2022   ID:  Monique Gonzalez, DOB 1955/07/09, MRN [id number]  PCP:  Monique Brooks, MD  Cardiologist:  Marlyn Corporal Samul Mcinroy, MD   Referring MD: Monique Brooks, MD   No chief complaint on file. Near syncope   ASSESSMENT AND PLAN:   Monique Gonzalez 67 year old woman with no significant prior cardiac history, has history of obesity, hyperlipidemia, chronic back pain recent lumbar spine fusion surgery in May 2024 undergoing rehab, GERD, depression, anxiety, osteoarthritis and prior episodes of vasovagal syncope while straining at defecation now with symptoms of near syncope associated with exertion at rehab or at times standing for extended durations.  No syncopal episodes.  No other cardiac symptoms reported.   Problem List Items Addressed This Visit     Near syncope - Primary    Will proceed with further evaluation by transthoracic echocardiogram and a Zio patch for 14 days. Advised increase oral fluid intake and keep herself well-hydrated. Advised her to keep moving her feet and calf muscles when she is expected to be standing for an extended stretch of time.  Advised to sit down or lay down immediately as she has been doing at the onset of symptoms to avoid falls or syncopal episodes.      Relevant Orders   EKG 12-Lead (Completed)   ECHOCARDIOGRAM COMPLETE   LONG TERM MONITOR (3-14 DAYS)   Will review results once available and follow-up based on test results.   History of Present Illness:    Monique Gonzalez is a 67 y.o. female who is being seen today for the evaluation of near syncope at the request of Monique Brooks, MD.  Pleasant woman accompanied for the visit here by her sister.  Has history of obesity, hyperlipidemia, chronic back pain recently underwent a lumbar fusion in May 2024, GERD, depression, anxiety, osteoarthritis, vasovagal syncopal episodes in the past with straining at defecation. No other prior significant  cardiac history.  Since her spine surgery in May she has been undergoing physical therapy rehab.  On occasions with therapy she noticed episodes of dizzy spells with near syncope post exertion.  Relieved with laying down or sitting down and having her feet up.  Couple incidences while at grocery store standing for extended duration she felt similar symptoms and had to sit down.  At home while preparing her meals she had 1 similar episode in the last couple months.  No falling down episodes no syncopal episodes. Denies any chest pain, shortness of breath, orthopnea.  Denies any palpitations.  Activities improving with rehab.  She does continue to have constipation which she describes as idiopathic and has been long uses stool softeners and keeps herself well-hydrated.  Relatively sedentary due to her back issues with no regular exercise until her rehab started.  Occasional alcohol consumption. No smoking.  No recreational drug use.  EKG in the clinic today shows sinus rhythm heart rate 98/min, QRS duration 88 ms, normal PR interval 192 ms.  Isolated PVC observed.  Blood work from August 13 noted hemoglobin 11.8, hematocrit 35.6, WBC 8.4, platelets 257 BUN 24, creatinine 0.92, sodium 137, potassium 5.3, normal transaminases and alkaline phosphatase, EGFR greater than 67 Past Medical History:  Diagnosis Date   Allergy    Ankle fracture 11/09/2019   Anxiety    Phreesia 06/04/2020   Anxiety and depression 12/22/2016   Arthritis    Phreesia 07/08/2020   Chronic idiopathic constipation 07/05/2013   Chronic pain  back and knee   Closed displaced fracture of styloid process of left ulna    Closed fracture of left distal radius 11/10/2019   Closed right ankle fracture 11/09/2019   Colitis 09/2012   infectious vs inflammatory.    Constipation 09/20/2013   Depression    Depression    Phreesia 06/04/2020   Depression    Phreesia 07/08/2020   Fall 11/10/2019   GERD (gastroesophageal reflux  disease)    Phreesia 06/04/2020   Hyperlipidemia    Insulin resistance 10/15/2016   Ischemic stricture intestine (HCC)    Lumbar radiculopathy 09/26/2022   Near syncope 12/08/2022   Obesity    Osteoarthritis    Osteopenia    Presence of right artificial knee joint 10/14/2016   Recurrent cold sores    Seasonal allergies    Spondylolisthesis of lumbar region 07/22/2022   Status post lumbar spinal fusion 07/22/2022   L4-5     Vitamin D deficiency 04/08/2016    Past Surgical History:  Procedure Laterality Date   BIOPSY  02/21/2020   Procedure: BIOPSY;  Surgeon: Iva Boop, MD;  Location: [facility] ENDOSCOPY;  Service: Endoscopy;;   COLON RESECTION N/A 10/14/2013   Procedure: LAPAROSCOPIC MOBILIZATION OF SPLENIC FLEXURE, LAPAROSCOPIC EXTENDED LEFT COLECTOMY;  Surgeon: Atilano Ina, MD;  Location: [facility] OR;  Service: General;  Laterality: N/A;   COLON SURGERY N/A    Phreesia 06/04/2020   COLONOSCOPY N/A 10/12/2013   Procedure: COLONOSCOPY;  Surgeon: Beverley Fiedler, MD;  Location: [facility] ENDOSCOPY;  Service: Endoscopy;  Laterality: N/A;   COLONOSCOPY WITH PROPOFOL N/A 02/21/2020   Procedure: COLONOSCOPY WITH PROPOFOL;  Surgeon: Iva Boop, MD;  Location: [facility] ENDOSCOPY;  Service: Endoscopy;  Laterality: N/A;   DILATION AND CURETTAGE, DIAGNOSTIC / THERAPEUTIC  1987   OPEN REDUCTION INTERNAL FIXATION (ORIF) DISTAL RADIAL FRACTURE Right 08/08/2014   Procedure: OPEN REDUCTION INTERNAL FIXATION (ORIF) DISTAL RADIAL FRACTURE;  Surgeon: Dominica Severin, MD;  Location: [facility] OR;  Service: Orthopedics;  Laterality: Right;  DVR Crosslock, MD available sometime around 1430-1500   ORIF ANKLE FRACTURE Right 11/10/2019   Procedure: POSSIBLEOPEN REDUCTION INTERNAL FIXATION (ORIF) ANKLE FRACTURE;  Surgeon: Roby Lofts, MD;  Location: [facility] OR;  Service: Orthopedics;  Laterality: Right;   ORIF WRIST FRACTURE Left 11/10/2019   Procedure: OPEN REDUCTION INTERNAL FIXATION (ORIF) WRIST FRACTURE;  Surgeon: Roby Lofts, MD;  Location: [facility] OR;  Service: Orthopedics;  Laterality: Left;   TONSILLECTOMY     TOTAL KNEE ARTHROPLASTY Right 08/05/2016   Procedure: RIGHT TOTAL KNEE ARTHROPLASTY;  Surgeon: Kathryne Hitch, MD;  Location: [facility] OR;  Service: Orthopedics;  Laterality: Right;    Current Medications: Current Meds  Medication Sig   acetaminophen (TYLENOL) 650 MG CR tablet Take 1,300 mg by mouth at bedtime.   atorvastatin (LIPITOR) 40 MG tablet TAKE 1 TABLET EVERY DAY   b complex vitamins capsule Take 1 capsule by mouth daily.   Calcium Carb-Cholecalciferol (CALCIUM 600+D3 PO) Take 1 tablet by mouth in the morning and at bedtime.   Cholecalciferol (VITAMIN D-3) 25 MCG (1000 UT) CAPS Take 1,000 Units by mouth daily with breakfast.   cyclobenzaprine (FLEXERIL) 10 MG tablet Take 1 tablet (10 mg total) by mouth 3 (three) times daily as needed for muscle spasms.   docusate sodium (COLACE) 100 MG capsule Take 1 capsule (100 mg total) by mouth 2 (two) times daily.   fluticasone (FLONASE) 50 MCG/ACT nasal spray Place 2 sprays into both nostrils at bedtime.  linaclotide (LINZESS) 290 MCG CAPS capsule Take 1 capsule (290 mcg total) by mouth daily 30 minutes before breakfast   loratadine (CLARITIN) 10 MG tablet Take 10 mg by mouth at bedtime.   Melatonin 10 MG TABS Take 10 mg by mouth at bedtime.   meloxicam (MOBIC) 15 MG tablet Take 1 tablet (15 mg total) by mouth daily.   Multiple Vitamins-Minerals (ONE-A-DAY WOMENS PO) Take 1 tablet by mouth daily with breakfast.   Omega-3 Fatty Acids (FISH OIL) 1000 MG CAPS Take 1,000 mg by mouth 2 (two) times daily.   pantoprazole (PROTONIX) 40 MG tablet TAKE 1 TABLET EVERY DAY   polyethylene glycol (MIRALAX / GLYCOLAX) 17 g packet Take 17 g by mouth daily.   venlafaxine XR (EFFEXOR-XR) 75 MG 24 hr capsule TAKE 2 CAPSULES EVERY DAY WITH BREAKFAST   Wheat Dextrin (BENEFIBER) POWD Take 1 Dose by mouth daily. 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    Employer: [employer]    Comment: LPN  Tobacco Use   Smoking status: Former    Current packs/day: 0.00    Types: Cigarettes    Quit date: 10/27/1993    Years since quitting: 29.1   Smokeless tobacco: Never  Vaping Use   Vaping status: Never Used  Substance and Sexual Activity   Alcohol use: Yes    Comment: one drink per week   Drug use: No   Sexual activity: Not Currently  Other Topics Concern   Not on file  Social History Narrative   Not on file   Social Determinants of Health   Financial Resource Strain: Low Risk  (09/22/2022)   Overall Financial Resource Strain (CARDIA)    Difficulty of Paying Living Expenses: Not very hard  Food Insecurity: No Food Insecurity (09/22/2022)   Hunger Vital Sign    Worried About Running Out of Food in the Last Year: Never true    Ran Out of Food in the Last Year: Never true  Transportation Needs: No Transportation Needs (09/22/2022)   PRAPARE - Administrator, Civil Service (Medical): No    Lack of Transportation (Non-Medical): No  Physical Activity: Sufficiently Active (09/22/2022)   Exercise Vital Sign    Days of Exercise per Week: 7 days    Minutes of Exercise per Session: 40 min  Recent Concern: Physical Activity - Inactive (07/10/2022)   Exercise Vital Sign    Days of Exercise per Week: 0 days    Minutes of Exercise per Session: 0 min  Stress: No Stress Concern Present (09/22/2022)   [employer] of Occupational Health - Occupational Stress Questionnaire    Feeling of Stress : Only a little  Recent Concern: Stress - Stress Concern Present (07/10/2022)   [employer] of Occupational Health - Occupational Stress Questionnaire    Feeling of Stress : Rather much  Social Connections: Unknown (09/22/2022)   Social Connection and Isolation Panel [NHANES]    Frequency of Communication with Friends and Family: More than three times a week    Frequency of Social Gatherings with Friends and Family: Patient declined    Attends Religious Services:  Patient declined    Active Member of Clubs or Organizations: No    Attends Banker Meetings: Never    Marital Status: Divorced     Family History: The patient's family history includes Arthritis in her mother, sister, and sister; Cancer in her father, maternal grandfather, maternal grandmother, and paternal grandmother; Colon cancer in her maternal grandfather and paternal grandmother; Heart disease in her paternal grandmother; Obesity in her sister; Stroke in her maternal grandfather, mother, and paternal grandfather; Thyroid disease in her mother and sister. There is no history of Sudden death, Hypertension, Hyperlipidemia, Heart attack, Diabetes, Rectal cancer, Stomach cancer, or Esophageal cancer. ROS:   Please see the history of present illness.    All 14 point review of systems negative except as described per history of present illness.  EKGs/Labs/Other Studies Reviewed:    The following studies were reviewed today:   EKG:  EKG Interpretation Date/Time:  Thursday December 11 2022 09:48:09 EDT Ventricular Rate:  98 PR Interval:  192 QRS Duration:  88 QT Interval:  352 QTC Calculation: 449 R Axis:   -5  Text Interpretation: Sinus rhythm with occasional Premature ventricular complexes Possible Anterior infarct , age undetermined When compared with ECG of 19-Feb-2020 10:59, Premature ventricular complexes are now Present Borderline criteria for Anterior infarct are now Present Confirmed by Huntley Dec reddy [phone]) on 12/11/2022 10:02:36 AM    Recent Labs: 10/21/2022: ALT 14; BUN 24;  Creat 0.92; Hemoglobin 11.8; Platelets 257; Potassium 5.3; Sodium 137  Recent Lipid Panel    Component Value Date/Time   CHOL 128 07/04/2022 1049   CHOL 172 03/25/2016 1341   TRIG 108 07/04/2022 1049   HDL 57 07/04/2022 1049   HDL 59 03/25/2016 1341   CHOLHDL 2.2 07/04/2022 1049   VLDL 35 (H) 03/20/2015 1420   LDLCALC 52 07/04/2022 1049    Physical Exam:    VS:  BP 138/82   Pulse 98   Ht 5\' 10"  (1.778 m)   Wt 289 lb 1.3 oz (131.1 kg)   SpO2 96%   BMI 41.48 kg/m     Wt Readings from Last 3 Encounters:  12/11/22 289 lb 1.3 oz (131.1 kg)  10/21/22 288 lb (130.6 kg)  09/26/22 295 lb (133.8 kg)     GENERAL:  Well nourished, well developed in no acute distress NECK: No JVD; No carotid bruits CARDIAC: RRR, S1 and S2 present, no murmurs, no rubs, no gallops CHEST:  Clear to auscultation without rales, wheezing or rhonchi  Extremities: No pitting pedal edema. Pulses bilaterally symmetric with radial 2+ and dorsalis pedis 2+ NEUROLOGIC:  Alert and oriented x 3  Medication Adjustments/Labs and Tests Ordered: Current medicines are reviewed at length with the patient today.  Concerns regarding medicines are outlined above.  Orders Placed This Encounter  Procedures   LONG TERM MONITOR (3-14 DAYS)   EKG 12-Lead   ECHOCARDIOGRAM COMPLETE   No orders of the defined types were placed in this encounter.   Signed, Laddie Math reddy Gal Feldhaus, MD, MPH, [facility]. 12/11/2022 10:21 AM    [facility]

## 2022-12-11 NOTE — Assessment & Plan Note (Signed)
Will proceed with further evaluation by transthoracic echocardiogram and a Zio patch for 14 days. Advised increase oral fluid intake and keep herself well-hydrated. Advised her to keep moving her feet and calf muscles when she is expected to be standing for an extended stretch of time.  Advised to sit down or lay down immediately as she has been doing at the onset of symptoms to avoid falls or syncopal episodes.

## 2022-12-15 ENCOUNTER — Other Ambulatory Visit (HOSPITAL_BASED_OUTPATIENT_CLINIC_OR_DEPARTMENT_OTHER): Payer: Self-pay

## 2022-12-15 MED ORDER — COMIRNATY 30 MCG/0.3ML IM SUSY
PREFILLED_SYRINGE | INTRAMUSCULAR | 0 refills | Status: DC
  Filled 2022-12-15: qty 0.3, 1d supply, fill #0

## 2022-12-15 MED ORDER — FLUAD 0.5 ML IM SUSY
PREFILLED_SYRINGE | INTRAMUSCULAR | 0 refills | Status: DC
  Filled 2022-12-15: qty 0.5, 1d supply, fill #0

## 2022-12-21 ENCOUNTER — Emergency Department (HOSPITAL_BASED_OUTPATIENT_CLINIC_OR_DEPARTMENT_OTHER): Payer: Medicare Other

## 2022-12-21 ENCOUNTER — Other Ambulatory Visit: Payer: Self-pay

## 2022-12-21 ENCOUNTER — Encounter (HOSPITAL_BASED_OUTPATIENT_CLINIC_OR_DEPARTMENT_OTHER): Payer: Self-pay

## 2022-12-21 ENCOUNTER — Inpatient Hospital Stay (HOSPITAL_BASED_OUTPATIENT_CLINIC_OR_DEPARTMENT_OTHER)
Admission: EM | Admit: 2022-12-21 | Discharge: 2022-12-27 | DRG: 534 | Disposition: A | Discharge status: Skilled Nursing Facility | Payer: Medicare Other | Attending: Internal Medicine | Admitting: Internal Medicine

## 2022-12-21 DIAGNOSIS — S72434A Nondisplaced fracture of medial condyle of right femur, initial encounter for closed fracture: Secondary | ICD-10-CM | POA: Diagnosis not present

## 2022-12-21 DIAGNOSIS — K219 Gastro-esophageal reflux disease without esophagitis: Secondary | ICD-10-CM | POA: Diagnosis present

## 2022-12-21 DIAGNOSIS — S7291XA Unspecified fracture of right femur, initial encounter for closed fracture: Secondary | ICD-10-CM | POA: Diagnosis present

## 2022-12-21 DIAGNOSIS — W109XXA Fall (on) (from) unspecified stairs and steps, initial encounter: Secondary | ICD-10-CM | POA: Diagnosis present

## 2022-12-21 DIAGNOSIS — M8580 Other specified disorders of bone density and structure, unspecified site: Secondary | ICD-10-CM | POA: Diagnosis present

## 2022-12-21 DIAGNOSIS — Z791 Long term (current) use of non-steroidal anti-inflammatories (NSAID): Secondary | ICD-10-CM

## 2022-12-21 DIAGNOSIS — Z79899 Other long term (current) drug therapy: Secondary | ICD-10-CM

## 2022-12-21 DIAGNOSIS — M9711XA Periprosthetic fracture around internal prosthetic right knee joint, initial encounter: Principal | ICD-10-CM | POA: Diagnosis present

## 2022-12-21 DIAGNOSIS — R55 Syncope and collapse: Secondary | ICD-10-CM | POA: Diagnosis not present

## 2022-12-21 DIAGNOSIS — Z882 Allergy status to sulfonamides status: Secondary | ICD-10-CM

## 2022-12-21 DIAGNOSIS — F32A Depression, unspecified: Secondary | ICD-10-CM | POA: Diagnosis present

## 2022-12-21 DIAGNOSIS — J302 Other seasonal allergic rhinitis: Secondary | ICD-10-CM | POA: Diagnosis present

## 2022-12-21 DIAGNOSIS — Z881 Allergy status to other antibiotic agents status: Secondary | ICD-10-CM

## 2022-12-21 DIAGNOSIS — Z87891 Personal history of nicotine dependence: Secondary | ICD-10-CM

## 2022-12-21 DIAGNOSIS — Z6841 Body Mass Index (BMI) 40.0 and over, adult: Secondary | ICD-10-CM

## 2022-12-21 DIAGNOSIS — Z981 Arthrodesis status: Secondary | ICD-10-CM

## 2022-12-21 DIAGNOSIS — Z96651 Presence of right artificial knee joint: Secondary | ICD-10-CM

## 2022-12-21 DIAGNOSIS — E785 Hyperlipidemia, unspecified: Secondary | ICD-10-CM | POA: Diagnosis present

## 2022-12-21 DIAGNOSIS — Z880 Allergy status to penicillin: Secondary | ICD-10-CM

## 2022-12-21 DIAGNOSIS — K5909 Other constipation: Secondary | ICD-10-CM | POA: Diagnosis present

## 2022-12-21 DIAGNOSIS — I493 Ventricular premature depolarization: Secondary | ICD-10-CM | POA: Diagnosis not present

## 2022-12-21 DIAGNOSIS — E669 Obesity, unspecified: Secondary | ICD-10-CM | POA: Diagnosis present

## 2022-12-21 MED ORDER — HYDROCODONE-ACETAMINOPHEN 5-325 MG PO TABS
1.0000 | ORAL_TABLET | Freq: Once | ORAL | Status: AC
  Administered 2022-12-21: 1 via ORAL
  Filled 2022-12-21: qty 1

## 2022-12-21 NOTE — ED Triage Notes (Signed)
Patient reports falling backwards off her front porch. Patient did not hit her head, NO LOC, no bleeding. Complains of right knee pain. States she was able to bare weight on it at home and ambulate with a walker tot he bathroom. However now she reports she is unable to straighten the leg due to the pain. Pain is rated 10/10 on exertion.

## 2022-12-21 NOTE — ED Provider Notes (Signed)
[place] [facility]  Provider Note  CSN: [id number] Arrival date & time: 12/21/22 2048  History Chief Complaint  Patient presents with   Knee Pain    Monique Gonzalez is a 67 y.o. female with history of prior R knee arthroplasty reports she lost her balance and fell on her back steps earlier in the day, injuring her R knee. She had some pain initially, but able to get around with a walker, however as the day went on, she became more painful and stiff to the point of not being able to get out bed. EMS was called and they helped her into the car with her sister who brought her here for evaluation. She has not had any other injuries from the fall. No head injury or LOC.    Home Medications Prior to Admission medications   Medication Sig Start Date End Date Taking? Authorizing Provider  acetaminophen (TYLENOL) 650 MG CR tablet Take 1,300 mg by mouth at bedtime.    [provider]  atorvastatin (LIPITOR) 40 MG tablet TAKE 1 TABLET EVERY DAY 07/21/22   Donita Brooks, MD  b complex vitamins capsule Take 1 capsule by mouth daily.    [provider]  Calcium Carb-Cholecalciferol (CALCIUM 600+D3 PO) Take 1 tablet by mouth in the morning and at bedtime.    [provider]  Cholecalciferol (VITAMIN D-3) 25 MCG (1000 UT) CAPS Take 1,000 Units by mouth daily with breakfast.    [provider]  COVID-19 mRNA vaccine, Pfizer, (COMIRNATY) syringe Inject into the muscle. 12/15/22   Judyann Munson, MD  cyclobenzaprine (FLEXERIL) 10 MG tablet Take 1 tablet (10 mg total) by mouth 3 (three) times daily as needed for muscle spasms. 07/23/22   Barnett Abu, MD  docusate sodium (COLACE) 100 MG capsule Take 1 capsule (100 mg total) by mouth 2 (two) times daily. 02/22/20   Joseph Art, DO  fluticasone (FLONASE) 50 MCG/ACT nasal spray Place 2 sprays into both nostrils at bedtime.    [provider]  influenza vaccine adjuvanted  (FLUAD) 0.5 ML injection Inject into the muscle. 12/15/22   Judyann Munson, MD  linaclotide [facility]) 290 MCG CAPS capsule Take 1 capsule (290 mcg total) by mouth daily 30 minutes before breakfast 01/08/22   Unk Lightning, PA  loratadine (CLARITIN) 10 MG tablet Take 10 mg by mouth at bedtime.    [provider]  Melatonin 10 MG TABS Take 10 mg by mouth at bedtime.    [provider]  meloxicam (MOBIC) 15 MG tablet Take 1 tablet (15 mg total) by mouth daily. 09/26/22   Donita Brooks, MD  Multiple Vitamins-Minerals (ONE-A-DAY WOMENS PO) Take 1 tablet by mouth daily with breakfast.    [provider]  Omega-3 Fatty Acids (FISH OIL) 1000 MG CAPS Take 1,000 mg by mouth 2 (two) times daily.    [provider]  pantoprazole (PROTONIX) 40 MG tablet TAKE 1 TABLET EVERY DAY 07/21/22   Donita Brooks, MD  polyethylene glycol (MIRALAX / GLYCOLAX) 17 g packet Take 17 g by mouth daily.    [provider]  venlafaxine XR (EFFEXOR-XR) 75 MG 24 hr capsule TAKE 2 CAPSULES EVERY DAY WITH BREAKFAST 07/21/22   Donita Brooks, MD  Wheat Dextrin (BENEFIBER) POWD Take 1 Dose by mouth daily. 2 teaspoons    [provider]     Allergies    Ciprofloxacin, Penicillins, and Sulfa antibiotics   Review  of Systems   Review of Systems Please see HPI for pertinent positives and negatives  Physical Exam BP 133/70   Pulse 76   Temp 97.9 F (36.6 C) (Oral)   Resp 15   Ht 5\' 10"  (1.778 m)   Wt 128.8 kg   SpO2 97%   BMI 40.75 kg/m   Physical Exam Vitals and nursing note reviewed.  Constitutional:      Appearance: She is obese.  HENT:     Head: Normocephalic.     Nose: Nose normal.  Eyes:     Extraocular Movements: Extraocular movements intact.  Pulmonary:     Effort: Pulmonary effort is normal.  Musculoskeletal:        General: Tenderness (R medial knee) present. No deformity.     Cervical back: Neck supple.     Comments: ROM limited by  pain  Skin:    Findings: No rash (on exposed skin).  Neurological:     Mental Status: She is alert and oriented to person, place, and time.  Psychiatric:        Mood and Affect: Mood normal.     ED Results / Procedures / Treatments   EKG None  Procedures Procedures  Medications Ordered in the ED Medications  HYDROcodone-acetaminophen (NORCO/VICODIN) 5-325 MG per tablet 1 tablet (1 tablet Oral Given 12/21/22 2314)  0.9 %  sodium chloride infusion ( Intravenous New Bag/Given 12/22/22 0112)  fentaNYL (SUBLIMAZE) injection 50 mcg (50 mcg Intravenous Given 12/22/22 0201)    Initial Impression and Plan  Patient here with R knee pain after mechanical fall. I personally viewed the images from radiology studies and agree with radiologist interpretation: Xray shows a nondisplaced periprosthetic fracture. Will place in knee immobilizer, begin pain medication and touch base with Ortho on call.   ED Course   Clinical Course as of 12/22/22 0207  Mon Dec 22, 2022  0022 Patient is unable to tolerate standing or walking well with knee immobilizer. Will likely need medical admission for pain control.  [CS]  0037 Spoke with Dr. Ave Filter, on call for Ortho, but not for Dr. Magnus Ivan who did her surgery. Unfortunately, I was unable to get in touch with anyone from that group tonight. Dr. Ave Filter recommends admission to Hospitalist for pain control and consult to [name] in the AM. Patient is amenable to that plan.  [CS]  0040 Dr. Christell Constant, Cyndia Skeeters, called back and agrees with plan. Requests she be NPO.  [CS]  0206 Spoke with Dr. Janalyn Shy, Hospitalist, who will accept for admission. CBC and BMP are unremarkable.  [CS]    Clinical Course User Index [CS] Pollyann Savoy, MD     MDM Rules/Calculators/A&P Medical Decision Making Problems Addressed: Periprosthetic fracture around internal prosthetic right knee joint, initial encounter: acute illness or injury  Amount and/or Complexity of Data  Reviewed Labs: ordered. Decision-making details documented in ED Course. Radiology: ordered and independent interpretation performed. Decision-making details documented in ED Course.  Risk Prescription drug management. Parenteral controlled substances. Decision regarding hospitalization.     Final Clinical Impression(s) / ED Diagnoses Final diagnoses:  Periprosthetic fracture around internal prosthetic right knee joint, initial encounter    Rx / DC Orders ED Discharge Orders     None        Pollyann Savoy, MD 12/22/22 [phone]

## 2022-12-22 ENCOUNTER — Inpatient Hospital Stay (HOSPITAL_COMMUNITY): Payer: Medicare Other

## 2022-12-22 ENCOUNTER — Emergency Department (HOSPITAL_BASED_OUTPATIENT_CLINIC_OR_DEPARTMENT_OTHER): Payer: Medicare Other

## 2022-12-22 ENCOUNTER — Other Ambulatory Visit: Payer: Self-pay

## 2022-12-22 ENCOUNTER — Encounter: Payer: Self-pay | Admitting: Rehabilitative and Restorative Service Providers"

## 2022-12-22 DIAGNOSIS — E66813 Obesity, class 3: Secondary | ICD-10-CM

## 2022-12-22 DIAGNOSIS — Z79899 Other long term (current) drug therapy: Secondary | ICD-10-CM | POA: Diagnosis not present

## 2022-12-22 DIAGNOSIS — I493 Ventricular premature depolarization: Secondary | ICD-10-CM | POA: Diagnosis not present

## 2022-12-22 DIAGNOSIS — R55 Syncope and collapse: Secondary | ICD-10-CM | POA: Diagnosis not present

## 2022-12-22 DIAGNOSIS — K5909 Other constipation: Secondary | ICD-10-CM | POA: Diagnosis present

## 2022-12-22 DIAGNOSIS — Z87891 Personal history of nicotine dependence: Secondary | ICD-10-CM | POA: Diagnosis not present

## 2022-12-22 DIAGNOSIS — E785 Hyperlipidemia, unspecified: Secondary | ICD-10-CM | POA: Diagnosis present

## 2022-12-22 DIAGNOSIS — I498 Other specified cardiac arrhythmias: Secondary | ICD-10-CM | POA: Diagnosis not present

## 2022-12-22 DIAGNOSIS — S7291XA Unspecified fracture of right femur, initial encounter for closed fracture: Secondary | ICD-10-CM

## 2022-12-22 DIAGNOSIS — Z96651 Presence of right artificial knee joint: Secondary | ICD-10-CM | POA: Diagnosis not present

## 2022-12-22 DIAGNOSIS — Z880 Allergy status to penicillin: Secondary | ICD-10-CM | POA: Diagnosis not present

## 2022-12-22 DIAGNOSIS — S72434A Nondisplaced fracture of medial condyle of right femur, initial encounter for closed fracture: Secondary | ICD-10-CM | POA: Diagnosis present

## 2022-12-22 DIAGNOSIS — Z791 Long term (current) use of non-steroidal anti-inflammatories (NSAID): Secondary | ICD-10-CM | POA: Diagnosis not present

## 2022-12-22 DIAGNOSIS — M9711XA Periprosthetic fracture around internal prosthetic right knee joint, initial encounter: Secondary | ICD-10-CM

## 2022-12-22 DIAGNOSIS — W109XXA Fall (on) (from) unspecified stairs and steps, initial encounter: Secondary | ICD-10-CM | POA: Diagnosis present

## 2022-12-22 DIAGNOSIS — Z882 Allergy status to sulfonamides status: Secondary | ICD-10-CM | POA: Diagnosis not present

## 2022-12-22 DIAGNOSIS — M8580 Other specified disorders of bone density and structure, unspecified site: Secondary | ICD-10-CM | POA: Diagnosis present

## 2022-12-22 DIAGNOSIS — K219 Gastro-esophageal reflux disease without esophagitis: Secondary | ICD-10-CM | POA: Diagnosis present

## 2022-12-22 DIAGNOSIS — J302 Other seasonal allergic rhinitis: Secondary | ICD-10-CM | POA: Diagnosis present

## 2022-12-22 DIAGNOSIS — Z881 Allergy status to other antibiotic agents status: Secondary | ICD-10-CM | POA: Diagnosis not present

## 2022-12-22 DIAGNOSIS — F32A Depression, unspecified: Secondary | ICD-10-CM | POA: Diagnosis present

## 2022-12-22 DIAGNOSIS — Z6841 Body Mass Index (BMI) 40.0 and over, adult: Secondary | ICD-10-CM | POA: Diagnosis not present

## 2022-12-22 DIAGNOSIS — Z981 Arthrodesis status: Secondary | ICD-10-CM | POA: Diagnosis not present

## 2022-12-22 LAB — BASIC METABOLIC PANEL
Anion gap: 14 (ref 5–15)
BUN: 23 mg/dL (ref 8–23)
CO2: 24 mmol/L (ref 22–32)
Calcium: 9.2 mg/dL (ref 8.9–10.3)
Chloride: 99 mmol/L (ref 98–111)
Creatinine, Ser: 1.02 mg/dL — ABNORMAL HIGH (ref 0.44–1.00)
GFR, Estimated: 60 mL/min — ABNORMAL LOW (ref >60)
Glucose, Bld: 121 mg/dL — ABNORMAL HIGH (ref 70–99)
Potassium: 4.3 mmol/L (ref 3.5–5.1)
Sodium: 137 mmol/L (ref 135–145)

## 2022-12-22 LAB — CBC
HCT: 33.4 % — ABNORMAL LOW (ref 36.0–46.0)
Hemoglobin: 10.8 g/dL — ABNORMAL LOW (ref 12.0–15.0)
MCH: 29 pg (ref 26.0–34.0)
MCHC: 32.3 g/dL (ref 30.0–36.0)
MCV: 89.5 fL (ref 80.0–100.0)
Platelets: 207 10*3/uL (ref 150–400)
RBC: 3.73 MIL/uL — ABNORMAL LOW (ref 3.87–5.11)
RDW: 14.6 % (ref 11.5–15.5)
WBC: 6.3 10*3/uL (ref 4.0–10.5)
nRBC: 0 % (ref 0.0–0.2)

## 2022-12-22 LAB — CBC WITH DIFFERENTIAL/PLATELET
Abs Immature Granulocytes: 0.04 10*3/uL (ref 0.00–0.07)
Basophils Absolute: 0 10*3/uL (ref 0.0–0.1)
Basophils Relative: 1 %
Eosinophils Absolute: 0.1 10*3/uL (ref 0.0–0.5)
Eosinophils Relative: 1 %
HCT: 34 % — ABNORMAL LOW (ref 36.0–46.0)
Hemoglobin: 11.2 g/dL — ABNORMAL LOW (ref 12.0–15.0)
Immature Granulocytes: 1 %
Lymphocytes Relative: 23 %
Lymphs Abs: 2 10*3/uL (ref 0.7–4.0)
MCH: 28.9 pg (ref 26.0–34.0)
MCHC: 32.9 g/dL (ref 30.0–36.0)
MCV: 87.9 fL (ref 80.0–100.0)
Monocytes Absolute: 0.9 10*3/uL (ref 0.1–1.0)
Monocytes Relative: 10 %
Neutro Abs: 5.7 10*3/uL (ref 1.7–7.7)
Neutrophils Relative %: 64 %
Platelets: 215 10*3/uL (ref 150–400)
RBC: 3.87 MIL/uL (ref 3.87–5.11)
RDW: 14.6 % (ref 11.5–15.5)
WBC: 8.7 10*3/uL (ref 4.0–10.5)
nRBC: 0 % (ref 0.0–0.2)

## 2022-12-22 LAB — TYPE AND SCREEN
ABO/RH(D): AB POS
Antibody Screen: NEGATIVE

## 2022-12-22 LAB — HIV ANTIBODY (ROUTINE TESTING W REFLEX): HIV Screen 4th Generation wRfx: NONREACTIVE

## 2022-12-22 MED ORDER — CYCLOBENZAPRINE HCL 10 MG PO TABS
10.0000 mg | ORAL_TABLET | Freq: Three times a day (TID) | ORAL | Status: DC | PRN
  Administered 2022-12-23 – 2022-12-26 (×5): 10 mg via ORAL
  Filled 2022-12-22 (×6): qty 1

## 2022-12-22 MED ORDER — MORPHINE SULFATE (PF) 2 MG/ML IV SOLN
0.5000 mg | INTRAVENOUS | Status: DC | PRN
  Administered 2022-12-22: 0.5 mg via INTRAVENOUS
  Filled 2022-12-22: qty 1

## 2022-12-22 MED ORDER — LINACLOTIDE 145 MCG PO CAPS
290.0000 ug | ORAL_CAPSULE | Freq: Every day | ORAL | Status: DC
  Administered 2022-12-23 – 2022-12-27 (×5): 290 ug via ORAL
  Filled 2022-12-22 (×5): qty 2

## 2022-12-22 MED ORDER — SODIUM CHLORIDE 0.9 % IV SOLN: Freq: Once | INTRAVENOUS | Status: AC

## 2022-12-22 MED ORDER — VENLAFAXINE HCL ER 75 MG PO CP24
75.0000 mg | ORAL_CAPSULE | Freq: Every day | ORAL | Status: DC
  Administered 2022-12-23 – 2022-12-27 (×5): 75 mg via ORAL
  Filled 2022-12-22 (×5): qty 1

## 2022-12-22 MED ORDER — HYDROCODONE-ACETAMINOPHEN 5-325 MG PO TABS
1.0000 | ORAL_TABLET | Freq: Four times a day (QID) | ORAL | Status: DC | PRN
  Administered 2022-12-22: 2 via ORAL
  Administered 2022-12-22 – 2022-12-23 (×2): 1 via ORAL
  Administered 2022-12-23 – 2022-12-24 (×4): 2 via ORAL
  Filled 2022-12-22 (×7): qty 2

## 2022-12-22 MED ORDER — FENTANYL CITRATE PF 50 MCG/ML IJ SOSY
50.0000 ug | PREFILLED_SYRINGE | Freq: Once | INTRAMUSCULAR | Status: AC
  Administered 2022-12-22: 50 ug via INTRAVENOUS
  Filled 2022-12-22: qty 1

## 2022-12-22 MED ORDER — PANTOPRAZOLE SODIUM 40 MG PO TBEC
40.0000 mg | DELAYED_RELEASE_TABLET | Freq: Every day | ORAL | Status: DC
  Administered 2022-12-22 – 2022-12-27 (×6): 40 mg via ORAL
  Filled 2022-12-22 (×6): qty 1

## 2022-12-22 MED ORDER — ACETAMINOPHEN 325 MG PO TABS: 650.0000 mg | ORAL_TABLET | Freq: Four times a day (QID) | ORAL | Status: DC | PRN

## 2022-12-22 MED ORDER — ADULT MULTIVITAMIN W/MINERALS CH
1.0000 | ORAL_TABLET | Freq: Every day | ORAL | Status: DC
  Administered 2022-12-22 – 2022-12-27 (×6): 1 via ORAL
  Filled 2022-12-22 (×6): qty 1

## 2022-12-22 MED ORDER — POLYETHYLENE GLYCOL 3350 17 G PO PACK
17.0000 g | PACK | Freq: Every day | ORAL | Status: DC
  Administered 2022-12-22 – 2022-12-27 (×4): 17 g via ORAL
  Filled 2022-12-22 (×6): qty 1

## 2022-12-22 MED ORDER — MORPHINE SULFATE (PF) 2 MG/ML IV SOLN
0.5000 mg | INTRAVENOUS | Status: DC | PRN
  Administered 2022-12-22 – 2022-12-26 (×6): 2 mg via INTRAVENOUS
  Filled 2022-12-22 (×6): qty 1

## 2022-12-22 NOTE — Plan of Care (Signed)
°  Problem: Coping: °Goal: Level of anxiety will decrease °Outcome: Progressing °  °

## 2022-12-22 NOTE — Progress Notes (Signed)
Progress Note   Patient: Monique Gonzalez FAO:[id number] DOB: October 18, 1955 DOA: 12/21/2022     0 DOS: the patient was seen and examined on 12/22/2022   Brief hospital course: 67 year old woman status post right TKA, who fell at home.  Imaging revealed TKA periprosthetic distal femur fracture.  Orthopedics recommended admission.  Patient was evaluated by orthopedics 10/15 with recommendation for nonoperative management.  Plan for PT evaluation and pain control.  Consultants Orthopedics   Procedures None   Assessment and Plan: * Closed right femoral fracture (HCC) Periprosthetic distal femoral fx. Discussed with Dr. Magnus Ivan.  Nonoperative management planned. [facility]WB RLE in [place], PT evaluate and treat, pain control  Depression Continue venlafaxine     Subjective:  Feels ok but has pain in right leg  Physical Exam: Vitals:   12/22/22 0103 12/22/22 0205 12/22/22 0334 12/22/22 0957  BP: 133/70 (!) 151/74 (!) 166/78 122/62  Pulse: 76 72 72 95  Resp: 15  15 19   Temp: 97.9 F (36.6 C)  98.6 F (37 C) 98.2 F (36.8 C)  TempSrc: Oral  Oral   SpO2: 97% 100%  95%  Weight:      Height:       Physical Exam Vitals reviewed.  Constitutional:      General: She is not in acute distress.    Appearance: She is not ill-appearing or toxic-appearing.  Cardiovascular:     Rate and Rhythm: Normal rate and regular rhythm.     Heart sounds: No murmur heard. Pulmonary:     Effort: Pulmonary effort is normal. No respiratory distress.     Breath sounds: No wheezing, rhonchi or rales.  Neurological:     Mental Status: She is alert.  Psychiatric:        Mood and Affect: Mood normal.        Behavior: Behavior normal.     Data Reviewed: BMP noted Hgb 11.2  > 10.8, stable  Family Communication: none  Disposition: Status is: Inpatient Remains inpatient appropriate because: knee pain     Time spent: 25 minutes  Author: Brendia Sacks, MD 12/22/2022 12:19 PM  For on call review  [email or web address].

## 2022-12-22 NOTE — Progress Notes (Signed)
Plan of Care Note for accepted transfer  Patient: Monique Gonzalez              RUE:[id number]  DOA: 12/21/2022     Facility requesting transfer: [facility] Requesting Provider: Pollyann Savoy, MD   Reason for transfer: Nondisplaced periprosthetic femoral fracture need orthopedics intervention.  Facility course:  Monique Gonzalez is a 67 y.o. female with history of prior R knee arthroplasty reports she lost her balance and fell on her back steps earlier in the day, injuring her R knee. She had some pain initially, but able to get around with a walker, however as the day went on, she became more painful and stiff to the point of not being able to get out bed. EMS was called and they helped her into the car with her sister who brought her here for evaluation. She has not had any other injuries from the fall. No head injury or LOC.   At presentation to ED patient found tachycardic 107 which has been improved to 76 otherwise hemodynamically stable.  Pending CBC and CMP.  X-ray of the right knee showing: IMPRESSION: 1. Generally nondisplaced periprosthetic fracture about the medial aspect of the femoral arthroplasty component.  2. Small displaced fracture fragment or perhaps alternately heterotopic ossification in the adjacent soft tissues.  Dr. Pilar Plate spoke with orthopedic on-call Dr. Christell Constant who recommended admit patient under hospitalist care, keep patient n.p.o.  Plan of care: The patient is accepted for admission for inpatient/observation status to Med-surg  unit, at [facility].  Check [email or web address] for on-call coverage.  TR[facility] will assume care on arrival to accepting facility. Until arrival, medical decision making responsibilities remain with the EDP.  However, TR[facility] available 24/7 for questions and assistance.   Nursing staff please page [facility] Admits and Consults [phone]) as soon as the patient arrives to the hospital.    Author: Tereasa Coop, MD  12/22/2022   [facility]

## 2022-12-22 NOTE — Consult Note (Signed)
Orthopedic Surgery Consult Note  Assessment: Patient is a 67 y.o. female with right nondisplaced medial femoral condyle periprosthetic fracture   Plan: -Operative plans: none -Okay for diet and dvt ppx from ortho perspective -Weight bearing status: TTWB RLE in KI -PT evaluate and treat -Pain control -Dispo: per primary  ___________________________________________________________________________   Reason for consult: right knee pain after TKA with Dr. Magnus Ivan  History:  Patient is a 67 y.o. female who lost her balance yesterday and fell to the ground. Noted acute onset of knee pain. Was unable to ambulate due to the pain. She was brought to [facility] where she was found to have a nondisplaced medial femoral condyle fracture. She was transferred to [facility] for further care. Reports right knee pain but no other extremity pain this morning. Denies paresthesias and numbness.     Past Medical History:  Diagnosis Date   Allergy    Ankle fracture 11/09/2019   Anxiety    Phreesia 06/04/2020   Anxiety and depression 12/22/2016   Arthritis    Phreesia 07/08/2020   Chronic idiopathic constipation 07/05/2013   Chronic pain    back and knee   Closed displaced fracture of styloid process of left ulna    Closed fracture of left distal radius 11/10/2019   Closed right ankle fracture 11/09/2019   Colitis 09/2012   infectious vs inflammatory.    Constipation 09/20/2013   Depression    Depression    Phreesia 06/04/2020   Depression    Phreesia 07/08/2020   Fall 11/10/2019   GERD (gastroesophageal reflux disease)    Phreesia 06/04/2020   Hyperlipidemia    Insulin resistance 10/15/2016   Ischemic stricture intestine (HCC)    Lumbar radiculopathy 09/26/2022   Near syncope 12/08/2022   Obesity    Osteoarthritis    Osteopenia    Presence of right artificial knee joint 10/14/2016   Recurrent cold sores    Seasonal allergies    Spondylolisthesis of lumbar region  07/22/2022   Status post lumbar spinal fusion 07/22/2022   L4-5     Vitamin D deficiency 04/08/2016    Past Surgical History:  Procedure Laterality Date   BIOPSY  02/21/2020   Procedure: BIOPSY;  Surgeon: Iva Boop, MD;  Location: [facility] ENDOSCOPY;  Service: Endoscopy;;   COLON RESECTION N/A 10/14/2013   Procedure: LAPAROSCOPIC MOBILIZATION OF SPLENIC FLEXURE, LAPAROSCOPIC EXTENDED LEFT COLECTOMY;  Surgeon: Atilano Ina, MD;  Location: [facility] OR;  Service: General;  Laterality: N/A;   COLON SURGERY N/A    Phreesia 06/04/2020   COLONOSCOPY N/A 10/12/2013   Procedure: COLONOSCOPY;  Surgeon: Beverley Fiedler, MD;  Location: [facility] ENDOSCOPY;  Service: Endoscopy;  Laterality: N/A;   COLONOSCOPY WITH PROPOFOL N/A 02/21/2020   Procedure: COLONOSCOPY WITH PROPOFOL;  Surgeon: Iva Boop, MD;  Location: [facility] ENDOSCOPY;  Service: Endoscopy;  Laterality: N/A;   DILATION AND CURETTAGE, DIAGNOSTIC / THERAPEUTIC  1987   OPEN REDUCTION INTERNAL FIXATION (ORIF) DISTAL RADIAL FRACTURE Right 08/08/2014   Procedure: OPEN REDUCTION INTERNAL FIXATION (ORIF) DISTAL RADIAL FRACTURE;  Surgeon: Dominica Severin, MD;  Location: [facility] OR;  Service: Orthopedics;  Laterality: Right;  DVR Crosslock, MD available sometime around 1430-1500   ORIF ANKLE FRACTURE Right 11/10/2019   Procedure: POSSIBLEOPEN REDUCTION INTERNAL FIXATION (ORIF) ANKLE FRACTURE;  Surgeon: Roby Lofts, MD;  Location: [facility] OR;  Service: Orthopedics;  Laterality: Right;   ORIF WRIST FRACTURE Left 11/10/2019   Procedure: OPEN REDUCTION INTERNAL FIXATION (ORIF) WRIST FRACTURE;  Surgeon: Jena Gauss,  Gillie Manners, MD;  Location: [facility] OR;  Service: Orthopedics;  Laterality: Left;   TONSILLECTOMY     TOTAL KNEE ARTHROPLASTY Right 08/05/2016   Procedure: RIGHT TOTAL KNEE ARTHROPLASTY;  Surgeon: Kathryne Hitch, MD;  Location: [facility] OR;  Service: Orthopedics;  Laterality: Right;     Social History   Tobacco Use   Smoking status: Former    Current packs/day: 0.00     Types: Cigarettes    Quit date: 10/27/1993    Years since quitting: 29.1   Smokeless tobacco: Never  Substance Use Topics   Alcohol use: Yes    Comment: one drink per week    Family history: -reviewed and not pertinent to periprosthetic fracture   Physical Exam:  General: no acute distress, appears stated age Neurologic: alert, answering questions appropriately, following commands Cardiovascular: regular rate, no cyanosis Respiratory: unlabored breathing on room air, symmetric chest rise Psychiatric: appropriate affect, normal cadence to speech  MSK:   -Right lower extremity  No tenderness to palpation over extremity, except around the knee EHL/TA/GSC intact Plantarflexes and dorsiflexes toes Sensation intact to light touch in sural, saphenous, tibial, deep peroneal, and superficial peroneal nerve distributions Foot warm and well perfused  Imaging: CT of the right knee from 12/22/2022 was independently reviewed and interpreted, showing a nondisplaced medial femoral condyle fracture. The prosthesis does not appear to be loose and there is no lucency around the components. No other fractures seen.    Patient name: Monique Gonzalez Patient MRN: [id number] Date: 12/22/22

## 2022-12-22 NOTE — Progress Notes (Signed)
Initial Nutrition Assessment  DOCUMENTATION CODES:   Morbid obesity  INTERVENTION:   -MVI with minerals daily -Magic cup BID with meals, each supplement provides 290 kcal and 9 grams of protein  -Continue regular diet  NUTRITION DIAGNOSIS:   Increased nutrient needs related to acute illness as evidenced by estimated needs.  GOAL:   Patient will meet greater than or equal to 90% of their needs  MONITOR:   PO intake, Supplement acceptance  REASON FOR ASSESSMENT:   Consult Assessment of nutrition requirement/status, Hip fracture protocol  ASSESSMENT:   Pt with medical history significant of prior R TKA. Admitted with rt periprosthetic femur fracture s/p fall.  Pt admitted with rt femur fracture.   Reviewed I/O's: -712 ml x 24 hours  UOP: 800 ml x 24 hours  Pt unavailable at time of visit. Attempted to speak with pt via call to hospital room phone, however, unable to reach. RD unable to obtain further nutrition-related history or complete nutrition-focused physical exam at this time.     Per H&P, pt fell on her back steps the day of admission, which cause injury to rt knee. She was initially able to ambulate with a walker, however was painful to do so. Pain progressed throughout the day to the point that she was no longer able to ambulate. Imaging studies revealed periprosthetic  femur fracture.   Per orthopedics notes, no plans for surgical intervention; plan knee immobilizer and touchdown weightbearing.   Pt currently on a regular diet. No meal completion data available to assess at this time.  Reviewed wt hx; pt has experienced a 3.7% wt loss over the past 3 months, which is significant for time frame.   Obesity is a complex, chronic medical condition that is optimally managed by a multidisciplinary care team. Weight loss is not an ideal goal for an acute inpatient hospitalization. However, if further work-up for obesity is warranted, consider outpatient referral to  's Nutrition and Diabetes Education Services.    Medications reviewed and include miralax.   Lab Results  Component Value Date   HGBA1C 5.5 07/04/2022   PTA DM medications are none.   Labs reviewed: CBGS: 113 (inpatient orders for glycemic control are none).    Diet Order:   Diet Order             Diet regular Room service appropriate? Yes; Fluid consistency: Thin  Diet effective now                   EDUCATION NEEDS:   No education needs have been identified at this time  Skin:  Skin Assessment: Reviewed RN Assessment  Last BM:  12/21/22  Height:   Ht Readings from Last 1 Encounters:  12/21/22 5\' 10"  (1.778 m)    Weight:   Wt Readings from Last 1 Encounters:  12/21/22 128.8 kg    Ideal Body Weight:  75.5 kg  BMI:  Body mass index is 40.75 kg/m.  Estimated Nutritional Needs:   Kcal:  1850-2050  Protein:  100-115 grams  Fluid:  > 1.8 L    Levada Schilling, RD, LDN, CDCES Registered Dietitian III Certified Diabetes Care and Education Specialist Please refer to [facility] for RD and/or RD on-call/weekend/after hours pager

## 2022-12-22 NOTE — Assessment & Plan Note (Addendum)
Periprosthetic distal femoral fx. Hip fx pathway Pain control including IV opiates per pathway EDP d/w Dr. Christell Constant Requests pt be NPO Ortho will presumably see pt in AM and make decision regarding surgical need / timing. GUPTA score = 0.7%

## 2022-12-22 NOTE — TOC Initial Note (Signed)
Transition of Care [facility]) - Initial/Assessment Note   Patient Details  Name: Monique Gonzalez MRN: [id number] Date of Birth: September 07, 1955  [facility] of [facility]) CM/SW Contact:    Ewing Schlein, LCSW Phone Number: 12/22/2022, 2:13 PM  Clinical Narrative: PT evaluation recommended SNF. Patient agreeable to rehab prior to returning home. FL2 done; PASRR received. Initial referral faxed out. TOC awaiting bed offers.               Expected Discharge Plan: Skilled Nursing Facility Barriers to Discharge: Continued Medical Work up  Patient Goals and CMS Choice Patient states their goals for this hospitalization and ongoing recovery are:: Go to short-term rehab CMS Medicare.gov Compare Post Acute Care list provided to:: Patient Choice offered to / list presented to : Patient  Expected Discharge Plan and Services In-house Referral: Clinical Social Work Post Acute Care Choice: Skilled Nursing Facility Living arrangements for the past 2 months: Single Family Home            DME Arranged: N/A DME Agency: NA  Prior Living Arrangements/Services Living arrangements for the past 2 months: Single Family Home Lives with:: Relatives Patient language and need for interpreter reviewed:: Yes Do you feel safe going back to the place where you live?: Yes      Need for Family Participation in Patient Care: No (Comment) Care giver support system in place?: Yes (comment) Criminal Activity/Legal Involvement Pertinent to Current Situation/Hospitalization: No - Comment as needed  Activities of Daily Living ADL Screening (condition at time of admission) Independently performs ADLs?: Yes (appropriate for developmental age) Is the patient deaf or have difficulty hearing?: No Does the patient have difficulty seeing, even when wearing glasses/contacts?: No Does the patient have difficulty concentrating, remembering, or making decisions?: No  Permission Sought/Granted Permission sought to share information with  : Facility Industrial/product designer granted to share information with : Yes, Verbal Permission Granted Permission granted to share info w AGENCY: SNFs  Emotional Assessment Appearance:: Appears stated age Attitude/Demeanor/Rapport: Engaged Affect (typically observed): Accepting Orientation: : Oriented to Self, Oriented to Place, Oriented to  Time, Oriented to Situation Alcohol / Substance Use: Not Applicable Psych Involvement: No (comment)  Admission diagnosis:  Closed right femoral fracture (HCC) [S72.91XA] Periprosthetic fracture around internal prosthetic right knee joint, initial encounter [B28.41LK] Patient Active Problem List   Diagnosis Date Noted   Closed right femoral fracture (HCC) 12/22/2022   Periprosthetic fracture around internal prosthetic right knee joint 12/22/2022   Near syncope 12/08/2022   Allergy    Anxiety    Arthritis    Chronic pain    GERD (gastroesophageal reflux disease)    Obesity    Osteopenia    Seasonal allergies    Lumbar radiculopathy 09/26/2022   Spondylolisthesis of lumbar region 07/22/2022   Status post lumbar spinal fusion 07/22/2022   Ischemic stricture intestine (HCC)    Colitis 02/19/2020   Closed fracture of left distal radius 11/10/2019   Fall 11/10/2019   Closed right ankle fracture 11/09/2019   Ankle fracture 11/09/2019   Closed displaced fracture of styloid process of left ulna    Anxiety and depression 12/22/2016   Insulin resistance 10/15/2016   Presence of right artificial knee joint 10/14/2016   Vitamin D deficiency 04/08/2016   Constipation 09/20/2013   Chronic idiopathic constipation 07/05/2013   Recurrent cold sores    Osteoarthritis    Depression    Hyperlipidemia    PCP:  Donita Brooks, MD Pharmacy:   [facility]  [place] [address] [place], [place] - [address] [place] [address] Phone: [phone] Fax: [phone]  [facility] - [place],  [place] - [address] [place] [address] Phone: [phone] Fax: [phone]  Social Determinants of Health (SDOH) Social History: SDOH Screenings   Food Insecurity: No Food Insecurity (12/22/2022)  Housing: Low Risk  (12/22/2022)  Transportation Needs: No Transportation Needs (12/22/2022)  Utilities: Not At Risk (12/22/2022)  Alcohol Screen: Low Risk  (07/10/2022)  Depression (PHQ2-9): Low Risk  (07/10/2022)  Financial Resource Strain: Low Risk  (09/22/2022)  Physical Activity: Sufficiently Active (09/22/2022)  Recent Concern: Physical Activity - Inactive (07/10/2022)  Social Connections: Unknown (09/22/2022)  Stress: No Stress Concern Present (09/22/2022)  Recent Concern: Stress - Stress Concern Present (07/10/2022)  Tobacco Use: Medium Risk (12/21/2022)   SDOH Interventions:    Readmission Risk Interventions     No data to display

## 2022-12-22 NOTE — Progress Notes (Signed)
[name], female   DOB: Jul 20, 1955, 67 y.o.   MRN: [id number] My partner Dr. Christell Gonzalez did see Monique Gonzalez this morning.  I agree with his assessment and plan.  Fortunately this does not look like it needs surgery.  I agree with the knee immobilizer and touchdown weightbearing on her right lower extremity.  We will order a regular diet for her as well as PT and OT.  I will likely put her in a hinged knee brace to allow flexion to 30 degrees.

## 2022-12-22 NOTE — Evaluation (Signed)
Physical Therapy Evaluation Patient Details Name: Monique Gonzalez MRN: [id number] DOB: 01-Dec-1955 Today's Date: 12/22/2022  History of Present Illness  67 yo female presents to therapy s/p mechanical fall on 10/13 in home setting. Fall resulted in R distal nondisplaced periprosthetic fracture of the medial femoral condyle. Orthopedics consulted and recommending conservative measures with pt in hinged knee brace to 30 degrees of flexion and R LE TDWB. Pt PMH includes but is not limited to: chronic pain, L ulna and radial fx, R ankle fx, depression, falls, GERD, HLD, lumbar spinal fusion L4-L5 (08/01/2022), and R TKA.  Clinical Impression     Pt admitted with above diagnosis.  Pt currently with functional limitations due to the deficits listed below (see PT Problem List). Pt in bed when PT arrived. Pt agreeable to therapy. Pt reports minimal pain at rest and pain medication administered prior to PT eval. Pt R LE in KI, PT doffed, adjusted bleedsoe brace and ensured 0-30 degrees and donned bleedsoe brace. Pt required min A for supine to sit for R LE, min A for SPT bed to recliner to L side only with RW and strong cues for R LE TDWB. Pt indicated increased pain when transitioning into sitting. Pt left seated in recliner, all needs in place and instructed to use call bell and await staff assist to return to bed. Pt verbalized understanding. Pt will benefit from continued inpatient follow up therapy, <3 hours/day at time of d/c.  Pt will benefit from acute skilled PT to increase their independence and safety with mobility to allow discharge.         If plan is discharge home, recommend the following: Two people to help with walking and/or transfers;A lot of help with bathing/dressing/bathroom;Assistance with cooking/housework;Assist for transportation;Help with stairs or ramp for entrance   Can travel by private vehicle        Equipment Recommendations None recommended by PT  Recommendations for Other  Services       Functional Status Assessment Patient has had a recent decline in their functional status and demonstrates the ability to make significant improvements in function in a reasonable and predictable amount of time.     Precautions / Restrictions Precautions Precautions: Fall Restrictions Weight Bearing Restrictions: Yes RLE Weight Bearing: Touchdown weight bearing      Mobility  Bed Mobility Overal bed mobility: Needs Assistance Bed Mobility: Supine to Sit     Supine to sit: Min assist     General bed mobility comments: A for R LE HOB elevated min cues and increased time    Transfers Overall transfer level: Needs assistance Equipment used: Rolling walker (2 wheels) Transfers: Bed to chair/wheelchair/BSC   Stand pivot transfers: Min assist, From elevated surface         General transfer comment: cues for R LE TDWB and for pt to scoot on LLE with use of B UE support at RW to L side only at this time    Ambulation/Gait               General Gait Details: NT  Stairs            Wheelchair Mobility     Tilt Bed    Modified Rankin (Stroke Patients Only)       Balance Overall balance assessment: Needs assistance, History of Falls Sitting-balance support: Feet supported Sitting balance-Leahy Scale: Good     Standing balance support: Bilateral upper extremity supported, During functional activity, Reliant on assistive device for balance  Standing balance-Leahy Scale: Poor                               Pertinent Vitals/Pain Pain Assessment Pain Assessment: 0-10 Pain Score: 8  Pain Location: R knee Pain Descriptors / Indicators: Constant, Discomfort, Grimacing, Guarding Pain Intervention(s): Limited activity within patient's tolerance, Monitored during session, Premedicated before session, Ice applied, Repositioned    Home Living Family/patient expects to be discharged to:: Private residence Living Arrangements: Other  relatives Available Help at Discharge: Family;Available 24 hours/day Type of Home: House Home Access: Stairs to enter Entrance Stairs-Rails: None Entrance Stairs-Number of Steps: 1 Alternate Level Stairs-Number of Steps: flight Home Layout: Two level Home Equipment: Shower seat - built in;Toilet riser;Grab bars - tub/shower;Rolling Walker (2 wheels);BSC/3in1;Adaptive equipment;Cane - single point      Prior Function Prior Level of Function : Independent/Modified Independent             Mobility Comments: no AD, reprots had just completed PT following back surgery in May and no longer using SPC or RW ADLs Comments: IND for all ADLs, self care tasks.     Extremity/Trunk Assessment        Lower Extremity Assessment Lower Extremity Assessment: RLE deficits/detail RLE Deficits / Details: knee MMT NT, remaining WFL RLE Sensation: WNL    Cervical / Trunk Assessment Cervical / Trunk Assessment: Normal  Communication   Communication Communication: No apparent difficulties  Cognition Arousal: Alert Behavior During Therapy: WFL for tasks assessed/performed Overall Cognitive Status: Within Functional Limits for tasks assessed                                          General Comments      Exercises     Assessment/Plan    PT Assessment Patient needs continued PT services  PT Problem List Decreased strength;Decreased range of motion;Decreased activity tolerance;Decreased balance;Decreased mobility;Decreased coordination;Pain       PT Treatment Interventions Gait training;DME instruction;Stair training;Functional mobility training;Therapeutic activities;Therapeutic exercise;Balance training;Neuromuscular re-education;Patient/family education;Modalities    PT Goals (Current goals can be found in the Care Plan section)  Acute Rehab PT Goals Patient Stated Goal: to be able to go home and get back to walking PT Goal Formulation: With patient Time For Goal  Achievement: 01/05/23 Potential to Achieve Goals: Good    Frequency Min 1X/week     Co-evaluation               AM-PAC PT "6 Clicks" Mobility  Outcome Measure Help needed turning from your back to your side while in a flat bed without using bedrails?: A Little Help needed moving from lying on your back to sitting on the side of a flat bed without using bedrails?: A Little Help needed moving to and from a bed to a chair (including a wheelchair)?: A Little Help needed standing up from a chair using your arms (e.g., wheelchair or bedside chair)?: A Little Help needed to walk in hospital room?: Total Help needed climbing 3-5 steps with a railing? : Total 6 Click Score: 14    End of Session Equipment Utilized During Treatment: Gait belt;Other (comment) (R bleedsoe) Activity Tolerance: Patient tolerated treatment well Patient left: in chair;with call bell/phone within reach Nurse Communication: Mobility status PT Visit Diagnosis: Unsteadiness on feet (R26.81);Other abnormalities of gait and mobility (R26.89);Muscle weakness (generalized) (M62.81);History of  falling (Z91.81);Pain;Difficulty in walking, not elsewhere classified (R26.2) Pain - Right/Left: Right Pain - part of body: Knee;Leg    Time: 1025-1104 PT Time Calculation (min) (ACUTE ONLY): 39 min   Charges:   PT Evaluation $PT Eval Low Complexity: 1 Low PT Treatments $Therapeutic Activity: 23-37 mins PT General Charges $$ ACUTE PT VISIT: 1 Visit         Johnny Bridge, PT Acute Rehab   Jacqualyn Posey 12/22/2022, 12:48 PM

## 2022-12-22 NOTE — H&P (Signed)
History and Physical    Patient: Monique Gonzalez [id number]:[id number] DOB: November 20, 1955 DOA: 12/21/2022 DOS: the patient was seen and examined on 12/22/2022 PCP: Donita Brooks, MD  Patient coming from: Home  Chief Complaint:  Chief Complaint  Patient presents with   Knee Pain   HPI: Monique Gonzalez is a 67 y.o. female with medical history significant of prior R TKA.  Pt lost balance, fell on her back steps earlier today.  Injury to R knee.  Painful but initially able to ambulate with walker.  However as day progressed pain worsened to point where she could no longer ambulate / get out of bed.  No head injury no LOC with fall.  Periprosthetic femur fx that is non-displaced / minimally displaced on imaging studies in ED.  EDP spoke with orthopedic on-call Dr. Christell Constant who recommended admit patient under hospitalist care, keep patient NPO.  Review of Systems: As mentioned in the history of present illness. All other systems reviewed and are negative. Past Medical History:  Diagnosis Date   Allergy    Ankle fracture 11/09/2019   Anxiety    Phreesia 06/04/2020   Anxiety and depression 12/22/2016   Arthritis    Phreesia 07/08/2020   Chronic idiopathic constipation 07/05/2013   Chronic pain    back and knee   Closed displaced fracture of styloid process of left ulna    Closed fracture of left distal radius 11/10/2019   Closed right ankle fracture 11/09/2019   Colitis 09/2012   infectious vs inflammatory.    Constipation 09/20/2013   Depression    Depression    Phreesia 06/04/2020   Depression    Phreesia 07/08/2020   Fall 11/10/2019   GERD (gastroesophageal reflux disease)    Phreesia 06/04/2020   Hyperlipidemia    Insulin resistance 10/15/2016   Ischemic stricture intestine (HCC)    Lumbar radiculopathy 09/26/2022   Near syncope 12/08/2022   Obesity    Osteoarthritis    Osteopenia    Presence of right artificial knee joint 10/14/2016   Recurrent cold sores    Seasonal  allergies    Spondylolisthesis of lumbar region 07/22/2022   Status post lumbar spinal fusion 07/22/2022   L4-5     Vitamin D deficiency 04/08/2016   Past Surgical History:  Procedure Laterality Date   BIOPSY  02/21/2020   Procedure: BIOPSY;  Surgeon: Iva Boop, MD;  Location: [facility] ENDOSCOPY;  Service: Endoscopy;;   COLON RESECTION N/A 10/14/2013   Procedure: LAPAROSCOPIC MOBILIZATION OF SPLENIC FLEXURE, LAPAROSCOPIC EXTENDED LEFT COLECTOMY;  Surgeon: Atilano Ina, MD;  Location: [facility] OR;  Service: General;  Laterality: N/A;   COLON SURGERY N/A    Phreesia 06/04/2020   COLONOSCOPY N/A 10/12/2013   Procedure: COLONOSCOPY;  Surgeon: Beverley Fiedler, MD;  Location: [facility] ENDOSCOPY;  Service: Endoscopy;  Laterality: N/A;   COLONOSCOPY WITH PROPOFOL N/A 02/21/2020   Procedure: COLONOSCOPY WITH PROPOFOL;  Surgeon: Iva Boop, MD;  Location: [facility] ENDOSCOPY;  Service: Endoscopy;  Laterality: N/A;   DILATION AND CURETTAGE, DIAGNOSTIC / THERAPEUTIC  1987   OPEN REDUCTION INTERNAL FIXATION (ORIF) DISTAL RADIAL FRACTURE Right 08/08/2014   Procedure: OPEN REDUCTION INTERNAL FIXATION (ORIF) DISTAL RADIAL FRACTURE;  Surgeon: Dominica Severin, MD;  Location: [facility] OR;  Service: Orthopedics;  Laterality: Right;  DVR Crosslock, MD available sometime around 1430-1500   ORIF ANKLE FRACTURE Right 11/10/2019   Procedure: POSSIBLEOPEN REDUCTION INTERNAL FIXATION (ORIF) ANKLE FRACTURE;  Surgeon: Roby Lofts, MD;  Location: [facility] OR;  Service: Orthopedics;  Laterality: Right;   ORIF WRIST FRACTURE Left 11/10/2019   Procedure: OPEN REDUCTION INTERNAL FIXATION (ORIF) WRIST FRACTURE;  Surgeon: Roby Lofts, MD;  Location: [facility] OR;  Service: Orthopedics;  Laterality: Left;   TONSILLECTOMY     TOTAL KNEE ARTHROPLASTY Right 08/05/2016   Procedure: RIGHT TOTAL KNEE ARTHROPLASTY;  Surgeon: Kathryne Hitch, MD;  Location: [facility] OR;  Service: Orthopedics;  Laterality: Right;   Social History:  reports that she quit  smoking about 29 years ago. Her smoking use included cigarettes. She has never used smokeless tobacco. She reports current alcohol use. She reports that she does not use drugs.  Allergies  Allergen Reactions   Ciprofloxacin Itching, Dermatitis and Rash   Penicillins Rash   Sulfa Antibiotics Rash    Family History  Problem Relation Age of Onset   Stroke Mother    Arthritis Mother    Thyroid disease Mother    Cancer Father    Arthritis Sister    Thyroid disease Sister    Cancer Maternal Grandmother    Stroke Maternal Grandfather    Cancer Maternal Grandfather    Colon cancer Maternal Grandfather    Cancer Paternal Grandmother    Heart disease Paternal Grandmother    Colon cancer Paternal Grandmother    Stroke Paternal Grandfather    Arthritis Sister    Obesity Sister    Sudden death Neg Hx    Hypertension Neg Hx    Hyperlipidemia Neg Hx    Heart attack Neg Hx    Diabetes Neg Hx    Rectal cancer Neg Hx    Stomach cancer Neg Hx    Esophageal cancer Neg Hx     Prior to Admission medications   Medication Sig Start Date End Date Taking? Authorizing Provider  acetaminophen (TYLENOL) 650 MG CR tablet Take 1,300 mg by mouth at bedtime.    [provider]  atorvastatin (LIPITOR) 40 MG tablet TAKE 1 TABLET EVERY DAY 07/21/22   Donita Brooks, MD  b complex vitamins capsule Take 1 capsule by mouth daily.    [provider]  Calcium Carb-Cholecalciferol (CALCIUM 600+D3 PO) Take 1 tablet by mouth in the morning and at bedtime.    [provider]  Cholecalciferol (VITAMIN D-3) 25 MCG (1000 UT) CAPS Take 1,000 Units by mouth daily with breakfast.    [provider]  COVID-19 mRNA vaccine, Pfizer, (COMIRNATY) syringe Inject into the muscle. 12/15/22   Judyann Munson, MD  cyclobenzaprine (FLEXERIL) 10 MG tablet Take 1 tablet (10 mg total) by mouth 3 (three) times daily as needed for muscle spasms. 07/23/22   Barnett Abu, MD  docusate sodium (COLACE)  100 MG capsule Take 1 capsule (100 mg total) by mouth 2 (two) times daily. 02/22/20   Joseph Art, DO  fluticasone (FLONASE) 50 MCG/ACT nasal spray Place 2 sprays into both nostrils at bedtime.    [provider]  influenza vaccine adjuvanted (FLUAD) 0.5 ML injection Inject into the muscle. 12/15/22   Judyann Munson, MD  linaclotide [facility]) 290 MCG CAPS capsule Take 1 capsule (290 mcg total) by mouth daily 30 minutes before breakfast 01/08/22   Unk Lightning, PA  loratadine (CLARITIN) 10 MG tablet Take 10 mg by mouth at bedtime.    [provider]  Melatonin 10 MG TABS Take 10 mg by mouth at bedtime.    [provider]  meloxicam (MOBIC) 15 MG tablet Take 1 tablet (15 mg total) by mouth daily.  09/26/22   Donita Brooks, MD  Multiple Vitamins-Minerals (ONE-A-DAY WOMENS PO) Take 1 tablet by mouth daily with breakfast.    [provider]  Omega-3 Fatty Acids (FISH OIL) 1000 MG CAPS Take 1,000 mg by mouth 2 (two) times daily.    [provider]  pantoprazole (PROTONIX) 40 MG tablet TAKE 1 TABLET EVERY DAY 07/21/22   Donita Brooks, MD  polyethylene glycol (MIRALAX / GLYCOLAX) 17 g packet Take 17 g by mouth daily.    [provider]  venlafaxine XR (EFFEXOR-XR) 75 MG 24 hr capsule TAKE 2 CAPSULES EVERY DAY WITH BREAKFAST 07/21/22   Donita Brooks, MD  Wheat Dextrin (BENEFIBER) POWD Take 1 Dose by mouth daily. 2 teaspoons    [provider]    Physical Exam: Vitals:   12/21/22 2111 12/22/22 0103 12/22/22 0205 12/22/22 0334  BP:  133/70 (!) 151/74 (!) 166/78  Pulse:  76 72 72  Resp:  15  15  Temp:  97.9 F (36.6 C)  98.6 F (37 C)  TempSrc:  Oral  Oral  SpO2:  97% 100%   Weight: 128.8 kg     Height: 5\' 10"  (1.778 m)      Constitutional: NAD, calm, comfortable Respiratory: clear to auscultation bilaterally, no wheezing, no crackles. Normal respiratory effort. No accessory muscle use.  Cardiovascular: Regular  rate and rhythm, no murmurs / rubs / gallops. No extremity edema. 2+ pedal pulses. No carotid bruits.  Abdomen: no tenderness, no masses palpated. No hepatosplenomegaly. Bowel sounds positive.  Musculoskeletal: R Knee TTP. Neurologic: CN 2-12 grossly intact. Sensation intact, DTR normal. Strength 5/5 in all 4.  Psychiatric: Normal judgment and insight. Alert and oriented x 3. Normal mood.   Data Reviewed:    Labs on Admission: I have personally reviewed following labs and imaging studies  CBC: Recent Labs  Lab 12/22/22 0109  WBC 8.7  NEUTROABS 5.7  HGB 11.2*  HCT 34.0*  MCV 87.9  PLT 215   Basic Metabolic Panel: Recent Labs  Lab 12/22/22 0109  NA 137  K 4.3  CL 99  CO2 24  GLUCOSE 121*  BUN 23  CREATININE 1.02*  CALCIUM 9.2   GFR: Estimated Creatinine Clearance: 76.1 mL/min (A) (by C-G formula based on SCr of 1.02 mg/dL (H)). Liver Function Tests: No results for input(s): "AST", "ALT", "ALKPHOS", "BILITOT", "PROT", "ALBUMIN" in the last 168 hours. No results for input(s): "LIPASE", "AMYLASE" in the last 168 hours. No results for input(s): "AMMONIA" in the last 168 hours. Coagulation Profile: No results for input(s): "INR", "PROTIME" in the last 168 hours. Cardiac Enzymes: No results for input(s): "CKTOTAL", "CKMB", "CKMBINDEX", "TROPONINI" in the last 168 hours. BNP (last 3 results) No results for input(s): "PROBNP" in the last 8760 hours. HbA1C: No results for input(s): "HGBA1C" in the last 72 hours. CBG: No results for input(s): "GLUCAP" in the last 168 hours. Lipid Profile: No results for input(s): "CHOL", "HDL", "LDLCALC", "TRIG", "CHOLHDL", "LDLDIRECT" in the last 72 hours. Thyroid Function Tests: No results for input(s): "TSH", "T4TOTAL", "FREET4", "T3FREE", "THYROIDAB" in the last 72 hours. Anemia Panel: No results for input(s): "VITAMINB12", "FOLATE", "FERRITIN", "TIBC", "IRON", "RETICCTPCT" in the last 72 hours. Urine analysis:    Component Value  Date/Time   COLORURINE AMBER (A) 02/19/2020 1930   APPEARANCEUR HAZY (A) 02/19/2020 1930   LABSPEC >1.046 (H) 02/19/2020 1930   PHURINE 5.0 02/19/2020 1930   GLUCOSEU NEGATIVE 02/19/2020 1930   HGBUR NEGATIVE 02/19/2020 1930   BILIRUBINUR  NEGATIVE 02/19/2020 1930   KETONESUR 5 (A) 02/19/2020 1930   PROTEINUR NEGATIVE 02/19/2020 1930   UROBILINOGEN 0.2 10/14/2013 1835   NITRITE NEGATIVE 02/19/2020 1930   LEUKOCYTESUR TRACE (A) 02/19/2020 1930    Radiological Exams on Admission: CT KNEE RIGHT WO CONTRAST  Result Date: 12/22/2022 CLINICAL DATA:  Knee trauma, occult fracture suspected. EXAM: CT OF THE RIGHT KNEE WITHOUT CONTRAST TECHNIQUE: Multidetector CT imaging of the right knee was performed according to the standard protocol. Multiplanar CT image reconstructions were also generated. RADIATION DOSE REDUCTION: This exam was performed according to the departmental dose-optimization program which includes automated exposure control, adjustment of the mA and/or kV according to patient size and/or use of iterative reconstruction technique. COMPARISON:  12/21/2022. FINDINGS: Bones/Joint/Cartilage Total knee arthroplasty changes are noted. There is a minimally displaced fracture of the medial femoral condyle with extension to the femoral hardware component. A tiny linear bony density is present along the superior aspect of the patella on the sagittal view, axial image 49. There are enthesopathic changes at the patella. No dislocation is seen. A moderate suprapatellar joint effusion is noted. Ligaments Suboptimally assessed by CT. Muscles and Tendons No gross abnormality is seen. Soft tissues Vascular calcifications are noted. Mild fat stranding is noted in the popliteal fossa IMPRESSION: 1. Status post total knee arthroplasty. There is a minimally displaced fracture of the medial femoral condyle with extension to the femoral component. 2. Linear calcification along the superior aspect of the patella on  the sagittal view, which may be acute or chronic. 3. Moderate joint effusion. Electronically Signed   By: Thornell Sartorius M.D.   On: 12/22/2022 02:26   DG Knee Complete 4 Views Right  Result Date: 12/21/2022 CLINICAL DATA:  Fall, pain EXAM: RIGHT KNEE - COMPLETE 4+ VIEW COMPARISON:  None Available. FINDINGS: Status post right knee total arthroplasty. Nondisplaced periprosthetic fracture about the medial aspect of the femoral arthroplasty component with a displaced fragment or perhaps alternately heterotopic ossification in the adjacent soft tissues. Soft tissue edema anteriorly. IMPRESSION: 1. Generally nondisplaced periprosthetic fracture about the medial aspect of the femoral arthroplasty component. 2. Small displaced fracture fragment or perhaps alternately heterotopic ossification in the adjacent soft tissues. Electronically Signed   By: Jearld Lesch M.D.   On: 12/21/2022 21:48    EKG: Independently reviewed.   Assessment and Plan: * Closed right femoral fracture (HCC) Periprosthetic distal femoral fx. Hip fx pathway Pain control including IV opiates per pathway EDP d/w Dr. Christell Constant Requests pt be NPO Ortho will presumably see pt in AM and make decision regarding surgical need / timing. GUPTA score = 0.7%      Advance Care Planning:   Code Status: Full Code  Consults: EDP d/w Dr. Christell Constant  Family Communication: No family in room  Severity of Illness: The appropriate patient status for this patient is INPATIENT. Inpatient status is judged to be reasonable and necessary in order to provide the required intensity of service to ensure the patient's safety. The patient's presenting symptoms, physical exam findings, and initial radiographic and laboratory data in the context of their chronic comorbidities is felt to place them at high risk for further clinical deterioration. Furthermore, it is not anticipated that the patient will be medically stable for discharge from the hospital within 2  midnights of admission.   * I certify that at the point of admission it is my clinical judgment that the patient will require inpatient hospital care spanning beyond 2 midnights from the point of admission  due to high intensity of service, high risk for further deterioration and high frequency of surveillance required.*  Author: Hillary Bow., DO 12/22/2022 3:50 AM  For on call review [email or web address].

## 2022-12-22 NOTE — Progress Notes (Signed)
Orthopedic Tech Progress Note Patient Details:  Monique Gonzalez [birth year] [id number]  [name], female   DOB: 1955/06/17, 67 y.o.   MRN: [id number] Order placed with [facility] for bledsoe brace. Darleen Crocker 12/22/2022, 8:57 AM

## 2022-12-22 NOTE — ED Notes (Signed)
Attempted to call report x1. No answer.  

## 2022-12-22 NOTE — ED Notes (Signed)
Attempt x 2 to call report. Bonita Quin Consulting civil engineer was going to look up the patient.

## 2022-12-22 NOTE — Hospital Course (Addendum)
67 year old woman status post right TKA, who fell at home.  Imaging revealed TKA periprosthetic distal femur fracture.  Orthopedics recommended admission.  Patient was evaluated by orthopedics 10/15 with recommendation for nonoperative management.  Plan for SNF.  Consultants Orthopedics   Procedures None

## 2022-12-22 NOTE — NC FL2 (Signed)
[place] MEDICAID FL2 LEVEL OF CARE FORM     IDENTIFICATION  Patient Name: Monique Gonzalez Birthdate: 13-May-1955 Sex: female Admission Date (Current Location): 12/21/2022  [facility] and [place] Number:  Producer, television/film/video and Address:  [facility],  [address] [place], [place] [address]      Provider Number: [id number]  Attending Physician Name and Address:  Standley Brooking, MD  Relative Name and Phone Number:  Kit Lenise Arena (sister) Ph: [phone]    Current Level of Care: Hospital Recommended Level of Care: Skilled Nursing Facility Prior Approval Number:    Date Approved/Denied:   PASRR Number: [id number]  Discharge Plan: SNF    Current Diagnoses: Patient Active Problem List   Diagnosis Date Noted   Closed right femoral fracture (HCC) 12/22/2022   Periprosthetic fracture around internal prosthetic right knee joint 12/22/2022   Near syncope 12/08/2022   Allergy    Anxiety    Arthritis    Chronic pain    GERD (gastroesophageal reflux disease)    Obesity    Osteopenia    Seasonal allergies    Lumbar radiculopathy 09/26/2022   Spondylolisthesis of lumbar region 07/22/2022   Status post lumbar spinal fusion 07/22/2022   Ischemic stricture intestine (HCC)    Colitis 02/19/2020   Closed fracture of left distal radius 11/10/2019   Fall 11/10/2019   Closed right ankle fracture 11/09/2019   Ankle fracture 11/09/2019   Closed displaced fracture of styloid process of left ulna    Anxiety and depression 12/22/2016   Insulin resistance 10/15/2016   Presence of right artificial knee joint 10/14/2016   Vitamin D deficiency 04/08/2016   Constipation 09/20/2013   Chronic idiopathic constipation 07/05/2013   Recurrent cold sores    Osteoarthritis    Depression    Hyperlipidemia     Orientation RESPIRATION BLADDER Height & Weight     Self, Time, Situation, Place  Normal Continent Weight: 284 lb (128.8 kg) Height:  5\' 10"  (177.8 cm)  BEHAVIORAL  SYMPTOMS/MOOD NEUROLOGICAL BOWEL NUTRITION STATUS     (N/A) Continent Diet (Regular diet)  AMBULATORY STATUS COMMUNICATION OF NEEDS Skin   Extensive Assist Verbally Normal                       Personal Care Assistance Level of Assistance  Bathing, Feeding, Dressing Bathing Assistance: Limited assistance Feeding assistance: Independent Dressing Assistance: Limited assistance     Functional Limitations Info  Sight, Hearing, Speech Sight Info: Impaired Hearing Info: Adequate Speech Info: Adequate    SPECIAL CARE FACTORS FREQUENCY  PT (By licensed PT), OT (By licensed OT)     PT Frequency: 5x's/week OT Frequency: 5x's/week            Contractures Contractures Info: Not present    Additional Factors Info  Code Status, Allergies, Psychotropic Code Status Info: Full Allergies Info: Ciprofloxacin, Penicillins, Sulfa Antibiotics Psychotropic Info: Effexor         Current Medications (12/22/2022):  This is the current hospital active medication list Current Facility-Administered Medications  Medication Dose Route Frequency Provider Last Rate Last Admin   acetaminophen (TYLENOL) tablet 650 mg  650 mg Oral Q6H PRN Standley Brooking, MD       cyclobenzaprine (FLEXERIL) tablet 10 mg  10 mg Oral TID PRN Standley Brooking, MD       HYDROcodone-acetaminophen (NORCO/VICODIN) 5-325 MG per tablet 1-2 tablet  1-2 tablet Oral Q6H PRN Hillary Bow, DO   2 tablet  at 12/22/22 0847   [START ON 12/23/2022] linaclotide (LINZESS) capsule 290 mcg  290 mcg Oral QAC breakfast Standley Brooking, MD       morphine (PF) 2 MG/ML injection 0.5-2 mg  0.5-2 mg Intravenous Q2H PRN Hillary Bow, DO   2 mg at 12/22/22 9528   multivitamin with minerals tablet 1 tablet  1 tablet Oral Daily Standley Brooking, MD       pantoprazole (PROTONIX) EC tablet 40 mg  40 mg Oral Daily Standley Brooking, MD       polyethylene glycol (MIRALAX / GLYCOLAX) packet 17 g  17 g Oral Daily Standley Brooking,  MD       [START ON 12/23/2022] venlafaxine XR (EFFEXOR-XR) 24 hr capsule 75 mg  75 mg Oral Q breakfast Standley Brooking, MD         Discharge Medications: Please see discharge summary for a list of discharge medications.  Relevant Imaging Results:  Relevant Lab Results:   Additional Information SS:[id number]  Ewing Schlein, LCSW

## 2022-12-22 NOTE — Progress Notes (Addendum)
Unaware that a patient had pended when [facility] called to give report and stated that they had called 2 times.  Pager never beeped to notify and bed was never approved. Apologies for any misunderstanding.

## 2022-12-23 DIAGNOSIS — S7291XA Unspecified fracture of right femur, initial encounter for closed fracture: Secondary | ICD-10-CM | POA: Diagnosis not present

## 2022-12-23 DIAGNOSIS — Z96651 Presence of right artificial knee joint: Secondary | ICD-10-CM | POA: Diagnosis not present

## 2022-12-23 NOTE — Evaluation (Signed)
Occupational Therapy Evaluation Patient Details Name: Monique Gonzalez MRN: [id number] DOB: [birth year] Today's Date: 12/23/2022   History of Present Illness 67 yr old female presents to therapy s/p mechanical fall on 12/21/22. Fall resulted in R distal nondisplaced periprosthetic fracture of the medial femoral condyle. Orthopedics consulted and recommending conservative measures with pt in hinged knee brace to 30 degrees of flexion and R LE TTWB. Pt PMH includes but is not limited to: chronic pain, L ulna and radial fx, R ankle fx, depression, falls, GERD, HLD, lumbar spinal fusion L4-L5 (May 2024), and R TKA.   Clinical Impression   The pt is currently presenting significantly below her baseline level of functioning for self-care management, given the below listed deficits (see OT problem list). As such, she requires increased assist for tasks, including lower body dressing, out of bed transfers, and toileting. Without further OT services, she is at risk for restricted ADL participation and progressive weakness. Patient will benefit from continued inpatient follow up therapy, <3 hours/day.        If plan is discharge home, recommend the following: A lot of help with bathing/dressing/bathroom;A lot of help with walking and/or transfers;Assistance with cooking/housework;Help with stairs or ramp for entrance;Assist for transportation    Functional Status Assessment  Patient has had a recent decline in their functional status and demonstrates the ability to make significant improvements in function in a reasonable and predictable amount of time.  Equipment Recommendations  Other (comment) (defer to next level of care)    Recommendations for Other Services       Precautions / Restrictions Precautions Precautions: Fall Restrictions Weight Bearing Restrictions: Yes RLE Weight Bearing: Touchdown weight bearing Other Position/Activity Restrictions: RLE brace      Mobility Bed  Mobility Overal bed mobility: Needs Assistance Bed Mobility: Supine to Sit     Supine to sit: Min assist          Transfers Overall transfer level: Needs assistance Equipment used: Rolling walker (2 wheels) Transfers: Sit to/from Stand Sit to Stand: Min assist, From elevated surface Stand pivot transfers: Mod assist                Balance     Sitting balance-Leahy Scale: Good       Standing balance-Leahy Scale: Poor       ADL either performed or assessed with clinical judgement   ADL Overall ADL's : Needs assistance/impaired Eating/Feeding: Independent;Sitting   Grooming: Set up;Sitting Grooming Details (indicate cue type and reason): at chair level         Upper Body Dressing : Set up;Sitting Upper Body Dressing Details (indicate cue type and reason): simulated at chair level Lower Body Dressing: Moderate assistance;Sitting/lateral leans   Toilet Transfer: Moderate assistance;Stand-pivot;Rolling walker (2 wheels);BSC/3in1 Toilet Transfer Details (indicate cue type and reason): Pt was instructed on transfer to and from bedside commode. She required cues for walker positioning, RLE TTWB, pivoting on LLE and reaching back for commode prior to sitting. Toileting- Clothing Manipulation and Hygiene: Moderate assistance;Sit to/from stand Toileting - Clothing Manipulation Details (indicate cue type and reason): The pt required steadying assist in standing, assist for clothing management, and cues for body positioning. She was able to perform anterior peri-hygiene in standing, though needing support of 1 upper extremity on the walker for support.             Vision Baseline Vision/History: 1 Wears glasses              Pertinent Vitals/Pain  Pain Assessment Pain Assessment: 0-10 Pain Score: 2  Pain Location: R knee Pain Intervention(s): Limited activity within patient's tolerance, Monitored during session     Extremity/Trunk Assessment Upper Extremity  Assessment Upper Extremity Assessment: Overall WFL for tasks assessed;Right hand dominant   Lower Extremity Assessment Lower Extremity Assessment: LLE deficits/detail RLE Deficits / Details: ankle AROM WFL. Knee not assessed, due to applied brace LLE Deficits / Details: AROM and strength WFL       Communication Communication Communication: No apparent difficulties   Cognition Arousal: Alert Behavior During Therapy: WFL for tasks assessed/performed Overall Cognitive Status: Within Functional Limits for tasks assessed    General Comments: Oriented x4, able to follow commands without difficulty                Home Living Family/patient expects to be discharged to:: Unsure Living Arrangements: Other relatives (2 sisters) Available Help at Discharge: Family Type of Home: House Home Access: Stairs to enter Secretary/administrator of Steps: 1   Home Layout: Multi-level Alternate Level Stairs-Number of Steps: 3 story home with basement, main, and upper level. Pt's bedroom is on the upper level. Full bathroom and 1/2 bathroom on main level of home.   Bathroom Shower/Tub: Producer, television/film/video: Handicapped height     Home Equipment: Agricultural consultant (2 wheels);Shower seat - built in;Cane - single Chief Technology Officer: Reacher;Sock aid;Long-handled shoe horn;Long-handled sponge        Prior Functioning/Environment Prior Level of Function : Independent/Modified Independent;Driving             Mobility Comments: She was independent with ambulation. ADLs Comments: She was independent with ADLs, driving, and cleaning.        OT Problem List: Decreased strength;Decreased range of motion;Impaired balance (sitting and/or standing);Decreased knowledge of use of DME or AE;Decreased knowledge of precautions;Pain      OT Treatment/Interventions: Self-care/ADL training;Therapeutic exercise;Energy conservation;DME and/or AE instruction;Therapeutic  activities;Patient/family education;Balance training    OT Goals(Current goals can be found in the care plan section) Acute Rehab OT Goals Patient Stated Goal: to return to prior level of functioning OT Goal Formulation: With patient Time For Goal Achievement: 01/06/23 Potential to Achieve Goals: Good ADL Goals Pt Will Perform Lower Body Dressing: with contact guard assist;sit to/from stand;sitting/lateral leans Pt Will Transfer to Toilet: with contact guard assist;bedside commode;stand pivot transfer Pt Will Perform Toileting - Clothing Manipulation and hygiene: with contact guard assist;sit to/from stand  OT Frequency: Min 1X/week       AM-PAC OT "6 Clicks" Daily Activity     Outcome Measure Help from another person eating meals?: None Help from another person taking care of personal grooming?: A Little Help from another person toileting, which includes using toliet, bedpan, or urinal?: A Lot Help from another person bathing (including washing, rinsing, drying)?: A Lot Help from another person to put on and taking off regular upper body clothing?: A Little Help from another person to put on and taking off regular lower body clothing?: A Lot 6 Click Score: 16   End of Session Equipment Utilized During Treatment: Gait belt;Rolling walker (2 wheels) Nurse Communication: Other (comment)  Activity Tolerance: Patient tolerated treatment well Patient left: in chair;with call bell/phone within reach;with chair alarm set  OT Visit Diagnosis: Unsteadiness on feet (R26.81);Other abnormalities of gait and mobility (R26.89);History of falling (Z91.81);Pain Pain - Right/Left: Right Pain - part of body:  (knee)  Time: 1308-6578 OT Time Calculation (min): 27 min Charges:  OT General Charges $OT Visit: 1 Visit OT Evaluation $OT Eval Moderate Complexity: 1 Mod OT Treatments $Self Care/Home Management : 8-22 mins   Reuben Likes, OTR/L 12/23/2022, 11:48 AM

## 2022-12-23 NOTE — Progress Notes (Signed)
Progress Note   Patient: Monique Gonzalez JYN:[id number] DOB: 09-04-1955 DOA: 12/21/2022     1 DOS: the patient was seen and examined on 12/23/2022   Brief hospital course: 67 year old woman status post right TKA, who fell at home.  Imaging revealed TKA periprosthetic distal femur fracture.  Orthopedics recommended admission.  Patient was evaluated by orthopedics 10/15 with recommendation for nonoperative management.  Plan for SNF.  Consultants Orthopedics   Procedures None  Assessment and Plan: * Closed right femoral fracture (HCC) Periprosthetic distal femoral fx. Discussed with Dr. Magnus Ivan.  Nonoperative management. TTWB RLE in [place], PT evaluate and treat, pain control   Depression Continue venlafaxine      Subjective:  Feels better Knee relaxed over night Worked with therapy  Physical Exam: Vitals:   12/22/22 2228 12/23/22 0129 12/23/22 0603 12/23/22 1405  BP: (!) 146/98 (!) 146/72 130/79 125/75  Pulse: 80 86 80 81  Resp: 16 16 15 18   Temp: 97.7 F (36.5 C) 98.1 F (36.7 C) 97.8 F (36.6 C) 97.9 F (36.6 C)  TempSrc: Oral     SpO2: 99% 99% 98% 97%  Weight:      Height:       Physical Exam Vitals reviewed.  Constitutional:      General: She is not in acute distress.    Appearance: She is not ill-appearing or toxic-appearing.  Cardiovascular:     Rate and Rhythm: Normal rate and regular rhythm.     Heart sounds: No murmur heard. Pulmonary:     Effort: Pulmonary effort is normal. No respiratory distress.     Breath sounds: No wheezing, rhonchi or rales.  Neurological:     Mental Status: She is alert.  Psychiatric:        Mood and Affect: Mood normal.        Behavior: Behavior normal.     Data Reviewed: No new data  Family Communication: twin sister at bedside  Disposition: Status is: Inpatient Remains inpatient appropriate because: Periprosthetic fracture     Time spent: 20 minutes  Author: Brendia Sacks, MD 12/23/2022 4:59 PM  For  on call review [email or web address].

## 2022-12-23 NOTE — Progress Notes (Signed)
Physical Therapy Treatment Patient Details Name: Monique Gonzalez MRN: [id number] DOB: 1955-08-22 Today's Date: 12/23/2022   History of Present Illness 67 yo female presents to therapy s/p mechanical fall on 10/13 in home setting. Fall resulted in R distal nondisplaced periprosthetic fracture of the medial femoral condyle. Orthopedics consulted and recommending conservative measures with pt in hinged knee brace to 30 degrees of flexion and R LE TDWB. Pt PMH includes but is not limited to: chronic pain, L ulna and radial fx, R ankle fx, depression, falls, GERD, HLD, lumbar spinal fusion L4-L5 (08/01/2022), and R TKA.    PT Comments  AxO x 3 pleasant and in good Spirits.  Pt OOB in recliner.  Sister present.  Brace had "slid" down several inched Pt's leg.  Readjusted.  Placed a gait belt around foot and showed Pt how to use as a leg lifter.  General transfer comment: Demonstarted and educated on proper tech to SPS 1/4 turn NOT using walker partial stance pivot from recliner to EOB then back to recliner while TTWB thru R LE.  Educated and practiced sit to stand onto walker with VC's on proper hand transfer and safety with stand to sit sliding R LE outward. General Gait Details: Demonstarted and Educated on proper tech to [employer].  Very short steps with R LE/foot flat @ NO weight/toe touch.  Educated on importance use B UE's "elbows extended" as well as safety with turns.   If plan is discharge home, recommend the following: Two people to help with walking and/or transfers;A lot of help with bathing/dressing/bathroom;Assistance with cooking/housework;Assist for transportation;Help with stairs or ramp for entrance   Can travel by private vehicle        Equipment Recommendations  None recommended by PT    Recommendations for Other Services       Precautions / Restrictions Precautions Precautions: Fall Restrictions Weight Bearing Restrictions: Yes RLE Weight Bearing: Touchdown weight bearing Other  Position/Activity Restrictions: RLE [place] Brace locked at 30 degrees "on all times except fro a shower" per Ortho order     Mobility  Bed Mobility               General bed mobility comments: OOB in recliner    Transfers Overall transfer level: Needs assistance Equipment used: Rolling walker (2 wheels) Transfers: Sit to/from Stand Sit to Stand: Min assist, From elevated surface Stand pivot transfers: Mod assist         General transfer comment: Demonstarted and educated on proper tech to SPS 1/4 turn NOT using walker partial stance pivot from recliner to EOB then back to recliner while TTWB thru R LE.  Educated and practiced sit to stand onto walker with VC's on proper hand transfer and safety with stand to sit sliding R LE outward.    Ambulation/Gait Ambulation/Gait assistance: Mod assist, Max assist Gait Distance (Feet): 2 Feet Assistive device: Rolling walker (2 wheels) Gait Pattern/deviations: Step-to pattern, Decreased stance time - right Gait velocity: decreased     General Gait Details: Demonstarted and Educated on proper tech to amb TTWB.  Very short steps with R LE/foot flat @ NO weight.  Educated on importance use B UE's "elbows extended" as well as safety with turns.   Stairs             Wheelchair Mobility     Tilt Bed    Modified Rankin (Stroke Patients Only)       Balance  Cognition Arousal: Alert Behavior During Therapy: WFL for tasks assessed/performed Overall Cognitive Status: Within Functional Limits for tasks assessed                                 General Comments: AxO x 3 pleasant and in good Spirits        Exercises      General Comments        Pertinent Vitals/Pain Pain Assessment Pain Assessment: 0-10 Pain Score: 5  Pain Location: R knee Pain Descriptors / Indicators: Constant, Discomfort, Grimacing, Guarding Pain Intervention(s):  Monitored during session, Repositioned, Ice applied    Home Living Family/patient expects to be discharged to:: Private residence Living Arrangements: Other relatives (2 sisters) Available Help at Discharge: Family Type of Home: House Home Access: Stairs to enter   Entrance Stairs-Number of Steps: 1 Alternate Level Stairs-Number of Steps: 3 story home with basement, main, and upper level. Pt's bedroom is on the upper level. Full bathroom and 1/2 bathroom on main level of home. Home Layout: Multi-level Home Equipment: Agricultural consultant (2 wheels);Shower seat - built in;Cane - single point;BSC/3in1      Prior Function            PT Goals (current goals can now be found in the care plan section) Progress towards PT goals: Progressing toward goals    Frequency    Min 1X/week      PT Plan      Co-evaluation              AM-PAC PT "6 Clicks" Mobility   Outcome Measure  Help needed turning from your back to your side while in a flat bed without using bedrails?: A Little Help needed moving from lying on your back to sitting on the side of a flat bed without using bedrails?: A Little Help needed moving to and from a bed to a chair (including a wheelchair)?: A Little Help needed standing up from a chair using your arms (e.g., wheelchair or bedside chair)?: A Little Help needed to walk in hospital room?: A Lot Help needed climbing 3-5 steps with a railing? : Total 6 Click Score: 15    End of Session Equipment Utilized During Treatment: Gait belt Activity Tolerance: Patient tolerated treatment well Patient left: in chair;with call bell/phone within reach Nurse Communication: Mobility status PT Visit Diagnosis: Unsteadiness on feet (R26.81);Other abnormalities of gait and mobility (R26.89);Muscle weakness (generalized) (M62.81);History of falling (Z91.81);Pain;Difficulty in walking, not elsewhere classified (R26.2) Pain - Right/Left: Right Pain - part of body: Knee;Leg      Time: 3244-0102 PT Time Calculation (min) (ACUTE ONLY): 25 min  Charges:    $Gait Training: 8-22 mins $Therapeutic Activity: 8-22 mins PT General Charges $$ ACUTE PT VISIT: 1 Visit                     Felecia Shelling  PTA Acute  Rehabilitation Services Office M-F          [phone]

## 2022-12-23 NOTE — TOC Progression Note (Signed)
Transition of Care [facility]) - Progression Note   Patient Details  Name: Monique Gonzalez MRN: [id number] Date of Birth: May 04, 1955  Transition of Care [facility]) CM/SW Contact  Ewing Schlein, LCSW Phone Number: 12/23/2022, 12:06 PM  Clinical Narrative: CSW provided patient with bed offers and Medicare star ratings:  [facility] [address] [place], [place] [address] [phone] Overall rating ?? Below average   [facility] [address] [place], [place] [address] [phone] Overall rating?? Below average  [facility] [address] [place], [place] [address] [phone] Overall rating ? Much below average  [facility] [address] [place], [place] [address] [phone] Overall rating ??? Average  [facility] [address] [place], [place] [address] [phone] Overall rating ???? Above average  [facility] [address] [place], [place] [address] [phone] Overall rating ???? Above average  [facility] [address] [place], [place] [address] [phone] Overall rating ????  Patient chose [facility] for rehab. CSW confirmed bed with [place] in admissions at [facility].  Expected Discharge Plan: Skilled Nursing Facility Barriers to Discharge: Continued Medical Work up  Expected Discharge Plan and Services In-house Referral: Clinical Social Work Post Acute Care Choice: Skilled Nursing Facility Living arrangements for the past 2 months: Single Family Home            DME Arranged: N/A DME Agency: NA  Social Determinants of Health (SDOH) Interventions SDOH Screenings   Food Insecurity: No Food Insecurity (12/22/2022)  Housing: Low Risk  (12/22/2022)  Transportation Needs: No Transportation Needs (12/22/2022)  Utilities: Not At Risk (12/22/2022)  Alcohol  Screen: Low Risk  (07/10/2022)  Depression (PHQ2-9): Low Risk  (07/10/2022)  Financial Resource Strain: Low Risk  (09/22/2022)  Physical Activity: Sufficiently Active (09/22/2022)  Recent Concern: Physical Activity - Inactive (07/10/2022)  Social Connections: Unknown (09/22/2022)  Stress: No Stress Concern Present (09/22/2022)  Recent Concern: Stress - Stress Concern Present (07/10/2022)  Tobacco Use: Medium Risk (12/21/2022)   Readmission Risk Interventions     No data to display

## 2022-12-23 NOTE — Discharge Instructions (Signed)
Only put touchdown weight on your left lower extremity but you can try up to 50% of your weight on your left lower extremity as comfort allows. Wear the hinged knee brace but you can take it off to rest your knee outside of the brace and shower.  Do not flex your knee past 30 degrees.

## 2022-12-24 DIAGNOSIS — S7291XA Unspecified fracture of right femur, initial encounter for closed fracture: Secondary | ICD-10-CM | POA: Diagnosis not present

## 2022-12-24 MED ORDER — SENNOSIDES-DOCUSATE SODIUM 8.6-50 MG PO TABS
1.0000 | ORAL_TABLET | Freq: Two times a day (BID) | ORAL | Status: DC
  Filled 2022-12-24: qty 1

## 2022-12-24 MED ORDER — DOCUSATE SODIUM 100 MG PO CAPS
100.0000 mg | ORAL_CAPSULE | Freq: Two times a day (BID) | ORAL | Status: DC
  Administered 2022-12-24 – 2022-12-27 (×6): 100 mg via ORAL
  Filled 2022-12-24 (×6): qty 1

## 2022-12-24 MED ORDER — ENOXAPARIN SODIUM 40 MG/0.4ML IJ SOSY
40.0000 mg | PREFILLED_SYRINGE | INTRAMUSCULAR | Status: DC
  Administered 2022-12-24 – 2022-12-25 (×2): 40 mg via SUBCUTANEOUS
  Filled 2022-12-24 (×2): qty 0.4

## 2022-12-24 MED ORDER — HYDROCODONE-ACETAMINOPHEN 5-325 MG PO TABS
1.0000 | ORAL_TABLET | ORAL | Status: DC | PRN
  Administered 2022-12-24 – 2022-12-27 (×10): 2 via ORAL
  Filled 2022-12-24 (×11): qty 2

## 2022-12-24 NOTE — Progress Notes (Signed)
PROGRESS NOTE    Monique Gonzalez  NWG:[id number] DOB: [birth year] DOA: 12/21/2022 PCP: Donita Brooks, MD   Brief Narrative:  67 year old woman status post right TKA fell at home. Imaging revealed TKA periprosthetic distal femur fracture. Orthopedics recommended nonoperative management.  PT recommended SNF placement.  Currently medically stable for SNF placement.  Assessment & Plan:   Closed right periprosthetic distal femoral fracture -Orthopedics recommended nonoperative management: Knee immobile EXTR and touchdown weightbearing on her right lower extremity. -Continue pain management.  Fall precautions. -PT recommended SNF placement.  Currently medically stable for SNF.  TOC following.  Depression -Continue venlafaxine  Chronic constipation -continue laxatives including Linzess  Morbid obesity -Outpatient follow-up   DVT prophylaxis: Start Lovenox Code Status: Full Family Communication: None at bedside Disposition Plan: Status is: Inpatient Remains inpatient appropriate because: Of severity of illness.  Need for SNF placement.    Consultants: Orthopedics  Procedures: None  Antimicrobials: None   Subjective: Patient seen and examined at bedside.  No fever, vomiting, chest pain reported.  Has intermittent right knee pain.  Objective: Vitals:   12/23/22 0603 12/23/22 1405 12/24/22 0028 12/24/22 0709  BP: 130/79 125/75 (!) 173/80 (!) 144/73  Pulse: 80 81 85 78  Resp: 15 18 18 18   Temp: 97.8 F (36.6 C) 97.9 F (36.6 C) 98.7 F (37.1 C) 98 F (36.7 C)  TempSrc:      SpO2: 98% 97% 98% 100%  Weight:      Height:        Intake/Output Summary (Last 24 hours) at 12/24/2022 0936 Last data filed at 12/24/2022 0844 Gross per 24 hour  Intake 720 ml  Output 2600 ml  Net -1880 ml   Filed Weights   12/21/22 2111  Weight: 128.8 kg    Examination:  General exam: Appears calm and comfortable.  On room air. Respiratory system: Bilateral decreased breath  sounds at bases Cardiovascular system: S1 & S2 heard, Rate controlled Gastrointestinal system: Abdomen is morbidly obese, nondistended, soft and nontender. Normal bowel sounds heard. Extremities: No cyanosis, clubbing, edema    Data Reviewed: I have personally reviewed following labs and imaging studies  CBC: Recent Labs  Lab 12/22/22 0109 12/22/22 0854  WBC 8.7 6.3  NEUTROABS 5.7  --   HGB 11.2* 10.8*  HCT 34.0* 33.4*  MCV 87.9 89.5  PLT 215 207   Basic Metabolic Panel: Recent Labs  Lab 12/22/22 0109  NA 137  K 4.3  CL 99  CO2 24  GLUCOSE 121*  BUN 23  CREATININE 1.02*  CALCIUM 9.2   GFR: Estimated Creatinine Clearance: 76.1 mL/min (A) (by C-G formula based on SCr of 1.02 mg/dL (H)). Liver Function Tests: No results for input(s): "AST", "ALT", "ALKPHOS", "BILITOT", "PROT", "ALBUMIN" in the last 168 hours. No results for input(s): "LIPASE", "AMYLASE" in the last 168 hours. No results for input(s): "AMMONIA" in the last 168 hours. Coagulation Profile: No results for input(s): "INR", "PROTIME" in the last 168 hours. Cardiac Enzymes: No results for input(s): "CKTOTAL", "CKMB", "CKMBINDEX", "TROPONINI" in the last 168 hours. BNP (last 3 results) No results for input(s): "PROBNP" in the last 8760 hours. HbA1C: No results for input(s): "HGBA1C" in the last 72 hours. CBG: No results for input(s): "GLUCAP" in the last 168 hours. Lipid Profile: No results for input(s): "CHOL", "HDL", "LDLCALC", "TRIG", "CHOLHDL", "LDLDIRECT" in the last 72 hours. Thyroid Function Tests: No results for input(s): "TSH", "T4TOTAL", "FREET4", "T3FREE", "THYROIDAB" in the last 72 hours. Anemia Panel: No results for  input(s): "VITAMINB12", "FOLATE", "FERRITIN", "TIBC", "IRON", "RETICCTPCT" in the last 72 hours. Sepsis Labs: No results for input(s): "PROCALCITON", "LATICACIDVEN" in the last 168 hours.  No results found for this or any previous visit (from the past 240 hour(s)).        Radiology Studies: No results found.      Scheduled Meds:  linaclotide  290 mcg Oral QAC breakfast   multivitamin with minerals  1 tablet Oral Daily   pantoprazole  40 mg Oral Daily   polyethylene glycol  17 g Oral Daily   venlafaxine XR  75 mg Oral Q breakfast   Continuous Infusions:        Glade Lloyd, MD [facility] 12/24/2022, 9:36 AM

## 2022-12-24 NOTE — Progress Notes (Signed)
Physical Therapy Treatment Patient Details Name: Monique Gonzalez MRN: [id number] DOB: 04/16/1955 Today's Date: 12/24/2022   History of Present Illness 67 yo female presents to therapy s/p mechanical fall on 10/13 in home setting. Fall resulted in R distal nondisplaced periprosthetic fracture of the medial femoral condyle. Orthopedics consulted and recommending conservative measures with pt in hinged knee brace to 30 degrees of flexion and R LE TDWB. Pt PMH includes but is not limited to: chronic pain, L ulna and radial fx, R ankle fx, depression, falls, GERD, HLD, lumbar spinal fusion L4-L5 (08/01/2022), and R TKA.    PT Comments  General Comments: AxO x 3 pleasant and in good Spirits.  Retired Charity fundraiser.  Plans to go to SNF Rehab as she lives home alone. Performed some R LE TE's AAROM with brace on.  Assisted with amb a few steps with walker but difficult due to TTWB restriction.  Recliner pulled up to pt then positioned to comfort.  ICE applied.      If plan is discharge home, recommend the following: Two people to help with walking and/or transfers;A lot of help with bathing/dressing/bathroom;Assistance with cooking/housework;Assist for transportation;Help with stairs or ramp for entrance   Can travel by private vehicle        Equipment Recommendations  None recommended by PT    Recommendations for Other Services       Precautions / Restrictions Precautions Precautions: Fall Restrictions Weight Bearing Restrictions: Yes RLE Weight Bearing: Touchdown weight bearing Other Position/Activity Restrictions: RLE Bledsoe Brace locked at 30 degrees "on all times except fro a shower" per Ortho order     Mobility  Bed Mobility               General bed mobility comments: OOB in recliner    Transfers Overall transfer level: Needs assistance Equipment used: Rolling walker (2 wheels) Transfers: Sit to/from Stand Sit to Stand: Min assist, From elevated surface           General  transfer comment: Demonstarted and educated on proper tech to SPS 1/4 turn NOT using walker partial stance pivot from recliner to EOB then back to recliner while TTWB thru R LE.  Educated and practiced sit to stand onto walker with VC's on proper hand transfer and safety with stand to sit sliding R LE outward.    Ambulation/Gait Ambulation/Gait assistance: Mod assist, Max assist Gait Distance (Feet): 3 Feet Assistive device: Rolling walker (2 wheels) Gait Pattern/deviations: Step-to pattern, Decreased stance time - right Gait velocity: decreased     General Gait Details: Demonstarted and Educated on proper tech to amb TTWB.  Very short steps with R LE/foot flat @ NO weight.  Educated on importance use B UE's "elbows extended" as well as safety with turns.   Stairs             Wheelchair Mobility     Tilt Bed    Modified Rankin (Stroke Patients Only)       Balance                                            Cognition Arousal: Alert   Overall Cognitive Status: Within Functional Limits for tasks assessed  General Comments: AxO x 3 pleasant and in good Spirits.  Retired Charity fundraiser.  Plans to go to SNF Rehab as she lives home alone.        Exercises      General Comments        Pertinent Vitals/Pain Pain Assessment Pain Assessment: Faces Faces Pain Scale: Hurts even more Pain Location: R knee Pain Descriptors / Indicators: Constant, Discomfort, Grimacing, Guarding Pain Intervention(s): Monitored during session, Patient requesting pain meds-RN notified, Repositioned, Ice applied    Home Living                          Prior Function            PT Goals (current goals can now be found in the care plan section) Progress towards PT goals: Progressing toward goals    Frequency    Min 1X/week      PT Plan      Co-evaluation              AM-PAC PT "6 Clicks" Mobility    Outcome Measure  Help needed turning from your back to your side while in a flat bed without using bedrails?: A Little Help needed moving from lying on your back to sitting on the side of a flat bed without using bedrails?: A Little Help needed moving to and from a bed to a chair (including a wheelchair)?: A Little Help needed standing up from a chair using your arms (e.g., wheelchair or bedside chair)?: A Little Help needed to walk in hospital room?: A Lot Help needed climbing 3-5 steps with a railing? : Total 6 Click Score: 15    End of Session Equipment Utilized During Treatment: Gait belt Activity Tolerance: Patient tolerated treatment well Patient left: in chair;with call bell/phone within reach Nurse Communication: Mobility status PT Visit Diagnosis: Unsteadiness on feet (R26.81);Other abnormalities of gait and mobility (R26.89);Muscle weakness (generalized) (M62.81);History of falling (Z91.81);Pain;Difficulty in walking, not elsewhere classified (R26.2) Pain - Right/Left: Right     Time: 7829-5621 PT Time Calculation (min) (ACUTE ONLY): 25 min  Charges:    $Gait Training: 8-22 mins $Therapeutic Exercise: 8-22 mins PT General Charges $$ ACUTE PT VISIT: 1 Visit                     Felecia Shelling  PTA Acute  Rehabilitation Services Office M-F          [phone]

## 2022-12-25 DIAGNOSIS — S7291XA Unspecified fracture of right femur, initial encounter for closed fracture: Secondary | ICD-10-CM | POA: Diagnosis not present

## 2022-12-25 MED ORDER — VENLAFAXINE HCL ER 75 MG PO CP24: 75.0000 mg | ORAL_CAPSULE | Freq: Every day | ORAL | Status: DC

## 2022-12-25 MED ORDER — HYDROCODONE-ACETAMINOPHEN 5-325 MG PO TABS: 1.0000 | ORAL_TABLET | ORAL | 0 refills | Status: DC | PRN

## 2022-12-25 NOTE — TOC Progression Note (Signed)
Transition of Care [facility]) - Progression Note   Patient Details  Name: Monique Gonzalez MRN: [id number] Date of Birth: 1955/09/11  Transition of [facility]) CM/SW Contact  Ewing Schlein, LCSW Phone Number: 12/25/2022, 9:52 AM  Clinical Narrative: CSW followed up with [place] in admissions at [facility] and was informed the bed will be available tomorrow rather than today. CSW updated patient, sister, and hospitalist.  Expected Discharge Plan: Skilled Nursing Facility Barriers to Discharge: Continued Medical Work up  Expected Discharge Plan and Services In-house Referral: Clinical Social Work Post Acute Care Choice: Skilled Nursing Facility Living arrangements for the past 2 months: Single Family Home            DME Arranged: N/A DME Agency: NA  Social Determinants of Health (SDOH) Interventions SDOH Screenings   Food Insecurity: No Food Insecurity (12/22/2022)  Housing: Low Risk  (12/22/2022)  Transportation Needs: No Transportation Needs (12/22/2022)  Utilities: Not At Risk (12/22/2022)  Alcohol Screen: Low Risk  (07/10/2022)  Depression (PHQ2-9): Low Risk  (07/10/2022)  Financial Resource Strain: Low Risk  (09/22/2022)  Physical Activity: Sufficiently Active (09/22/2022)  Recent Concern: Physical Activity - Inactive (07/10/2022)  Social Connections: Unknown (09/22/2022)  Stress: No Stress Concern Present (09/22/2022)  Recent Concern: Stress - Stress Concern Present (07/10/2022)  Tobacco Use: Medium Risk (12/21/2022)   Readmission Risk Interventions     No data to display

## 2022-12-25 NOTE — Progress Notes (Signed)
Occupational Therapy Treatment Patient Details Name: Monique Gonzalez MRN: [id number] DOB: 1956/01/03 Today's Date: 12/25/2022   History of present illness 67 yr old female presents to therapy s/p mechanical fall on 10/13 in home setting. Fall resulted in R distal nondisplaced periprosthetic fracture of the medial femoral condyle. Orthopedics consulted and recommending conservative measures with pt in hinged knee brace to 30 degrees of flexion and R LE TDWB. Pt PMH includes but is not limited to: chronic pain, L ulna and radial fx, R ankle fx, depression, falls, GERD, HLD, lumbar spinal fusion L4-L5 (08/01/2022), and R TKA.   OT comments  The pt was seen for progression of functional activity and ADL instruction for toileting at bedside commode level. She required assist for functional transfers and toileting tasks, as well as intermittent cues for R LE TDWB status during dynamic standing activity. She presented with good effort and participation. Continue OT plan of care. Patient will benefit from continued inpatient follow up therapy, <3 hours/day.       If plan is discharge home, recommend the following:  A lot of help with bathing/dressing/bathroom;A lot of help with walking and/or transfers;Assistance with cooking/housework;Help with stairs or ramp for entrance;Assist for transportation   Equipment Recommendations  Other (comment) (defer to next level of care)    Recommendations for Other Services      Precautions / Restrictions Precautions Precautions: Fall Restrictions Weight Bearing Restrictions: Yes RLE Weight Bearing: Touchdown weight bearing Other Position/Activity Restrictions: RLE Bledsoe Brace locked at 30 degrees "on all times except fro a shower" per Ortho order       Mobility Bed Mobility Overal bed mobility: Needs Assistance Bed Mobility: Supine to Sit     Supine to sit: HOB elevated, Used rails, Contact guard          Transfers Overall transfer level: Needs  assistance Equipment used: Rolling walker (2 wheels) Transfers: Sit to/from Stand, Bed to chair/wheelchair/BSC Sit to Stand: Min assist, From elevated surface Stand pivot transfers: Min assist         General transfer comment: pt required cues for stand-pivot technique, and TTWB status         ADL either performed or assessed with clinical judgement   ADL Overall ADL's : Needs assistance/impaired Eating/Feeding: Independent;Sitting Eating/Feeding Details (indicate cue type and reason): at chair level Grooming: Set up;Sitting Grooming Details (indicate cue type and reason): at chair level; she performed hand washing         Upper Body Dressing : Set up;Sitting   Lower Body Dressing: Moderate assistance;Sitting/lateral leans   Toilet Transfer: Stand-pivot;Rolling walker (2 wheels);BSC/3in1;Minimal assistance;Cueing for sequencing   Toileting- Clothing Manipulation and Hygiene: Moderate assistance;Sit to/from stand Toileting - Clothing Manipulation Details (indicate cue type and reason): The pt required steadying assist in standing, assist for clothing management, and cues for body positioning. She was able to perform anterior peri-hygiene in sitting with SBA.             Vision Baseline Vision/History: 1 Wears glasses            Cognition Arousal: Alert Behavior During Therapy: WFL for tasks assessed/performed Overall Cognitive Status: Within Functional Limits for tasks assessed                            Pertinent Vitals/ Pain       Pain Assessment Pain Assessment: 0-10 Pain Score: 4  Pain Location: R knee Pain Intervention(s): Limited activity within  patient's tolerance, Monitored during session         Frequency  Min 1X/week        Progress Toward Goals  OT Goals(current goals can now be found in the care plan section)  Progress towards OT goals: Progressing toward goals  Acute Rehab OT Goals Patient Stated Goal: to return to prior  level of functioning OT Goal Formulation: With patient Time For Goal Achievement: 01/06/23 Potential to Achieve Goals: Good  Plan         AM-PAC OT "6 Clicks" Daily Activity     Outcome Measure   Help from another person eating meals?: None Help from another person taking care of personal grooming?: A Little Help from another person toileting, which includes using toliet, bedpan, or urinal?: A Lot Help from another person bathing (including washing, rinsing, drying)?: A Lot Help from another person to put on and taking off regular upper body clothing?: A Little Help from another person to put on and taking off regular lower body clothing?: A Lot 6 Click Score: 16    End of Session Equipment Utilized During Treatment: Rolling walker (2 wheels)  OT Visit Diagnosis: Unsteadiness on feet (R26.81);Other abnormalities of gait and mobility (R26.89);History of falling (Z91.81);Pain Pain - Right/Left: Right Pain - part of body: Knee   Activity Tolerance Patient tolerated treatment well   Patient Left in chair;with call bell/phone within reach;with family/visitor present   Nurse Communication Other (comment)        Time: 5284-1324 OT Time Calculation (min): 15 min  Charges: OT General Charges $OT Visit: 1 Visit OT Treatments $Self Care/Home Management : 8-22 mins     Reuben Likes, OTR/L  12/25/2022, 1:41 PM

## 2022-12-25 NOTE — Discharge Summary (Signed)
Physician Discharge Summary  Monique Gonzalez QMV:[id number] DOB: 1955-06-15 DOA: 12/21/2022  PCP: Donita Brooks, MD  Admit date: 12/21/2022 Discharge date: 12/25/2022  Admitted From: Home Disposition: SNF  Recommendations for Outpatient Follow-up:  Follow up with SNF provider at earliest convenience Outpatient follow-up with orthopedics.  Activity as per orthopedics recommendations Follow up in ED if symptoms worsen or new appear   Home Health: No Equipment/Devices: None  Discharge Condition: Stable CODE STATUS: Full Diet recommendation: Heart healthy  Brief/Interim Summary: 67 year old woman status post right TKA fell at home. Imaging revealed TKA periprosthetic distal femur fracture. Orthopedics recommended nonoperative management.  PT recommended SNF placement.  Currently medically stable for SNF placement.  Discharge to SNF once bed is available.  Discharge Diagnoses:   Closed right periprosthetic distal femoral fracture -Orthopedics recommended nonoperative management: Knee immobilizer and touchdown weightbearing on her right lower extremity. -Continue pain management.  Fall precautions. -PT recommended SNF placement.  Currently medically stable for SNF.   -Outpatient follow-up with orthopedics.  Discharge to SNF once bed is available.   Depression -Continue venlafaxine   Chronic constipation -continue laxatives including Linzess   Morbid obesity -Outpatient follow-up   Discharge Instructions   Allergies as of 12/25/2022       Reactions   Ciprofloxacin Itching, Dermatitis, Rash   Penicillins Rash   Sulfa Antibiotics Rash        Medication List     STOP taking these medications    Comirnaty syringe Generic drug: COVID-19 mRNA vaccine (Pfizer)   Fluad 0.5 ML injection Generic drug: influenza vaccine adjuvanted       TAKE these medications    acetaminophen 650 MG CR tablet Commonly known as: TYLENOL Take 1,300 mg by mouth at bedtime.    atorvastatin 40 MG tablet Commonly known as: LIPITOR TAKE 1 TABLET EVERY DAY   b complex vitamins capsule Take 1 capsule by mouth daily.   Benefiber Powd Take 1 Dose by mouth daily. 2 teaspoons   CALCIUM 600+D3 PO Take 1 tablet by mouth in the morning and at bedtime.   cyclobenzaprine 10 MG tablet Commonly known as: FLEXERIL Take 1 tablet (10 mg total) by mouth 3 (three) times daily as needed for muscle spasms.   docusate sodium 100 MG capsule Commonly known as: COLACE Take 1 capsule (100 mg total) by mouth 2 (two) times daily.   Fish Oil 1000 MG Caps Take 1,000 mg by mouth 2 (two) times daily.   fluticasone 50 MCG/ACT nasal spray Commonly known as: FLONASE Place 2 sprays into both nostrils at bedtime.   HYDROcodone-acetaminophen 5-325 MG tablet Commonly known as: NORCO/VICODIN Take 1 tablet by mouth every 4 (four) hours as needed for moderate pain (pain score 4-6).   linaclotide 290 MCG Caps capsule Commonly known as: LINZESS Take 1 capsule (290 mcg total) by mouth daily 30 minutes before breakfast   loratadine 10 MG tablet Commonly known as: CLARITIN Take 10 mg by mouth at bedtime.   Melatonin 10 MG Tabs Take 10 mg by mouth at bedtime.   meloxicam 15 MG tablet Commonly known as: MOBIC Take 1 tablet (15 mg total) by mouth daily.   ONE-A-DAY WOMENS PO Take 1 tablet by mouth daily with breakfast.   pantoprazole 40 MG tablet Commonly known as: PROTONIX TAKE 1 TABLET EVERY DAY   polyethylene glycol 17 g packet Commonly known as: MIRALAX / GLYCOLAX Take 17 g by mouth daily.   venlafaxine XR 75 MG 24 hr capsule Commonly known as: EFFEXOR-XR  Take 1 capsule (75 mg total) by mouth daily with breakfast. What changed: See the new instructions.   Vitamin D-3 25 MCG (1000 UT) Caps Take 1,000 Units by mouth daily with breakfast.        Contact information for follow-up providers     Kathryne Hitch, MD. Schedule an appointment as soon as possible  for a visit in 2 week(s).   Specialty: Orthopedic Surgery Contact information: [address] [place] [address] [phone]              Contact information for after-discharge care     Destination     [facility]erred [facility] .   Service: Skilled Nursing Contact information: [address] [facility] [address] Address [place] [address] [phone]                    Allergies  Allergen Reactions   Ciprofloxacin Itching, Dermatitis and Rash   Penicillins Rash   Sulfa Antibiotics Rash    Consultations: Orthopedics   Procedures/Studies: Chest Portable 1 View  Result Date: 12/22/2022 CLINICAL DATA:  Preop lumbar surgery. EXAM: PORTABLE CHEST 1 VIEW COMPARISON:  None Available. FINDINGS: Mild elevation of right hemidiaphragm.The cardiomediastinal contours are normal. Aortic atherosclerosis. Subsegmental atelectasis or scarring at the left lung base. Pulmonary vasculature is normal. No consolidation, pleural effusion, or pneumothorax. No acute osseous abnormalities are seen. Presumed external monitor overlying the left chest. IMPRESSION: Subsegmental atelectasis or scarring at the left lung base. Electronically Signed   By: Narda Rutherford M.D.   On: 12/22/2022 10:49   CT KNEE RIGHT WO CONTRAST  Result Date: 12/22/2022 CLINICAL DATA:  Knee trauma, occult fracture suspected. EXAM: CT OF THE RIGHT KNEE WITHOUT CONTRAST TECHNIQUE: Multidetector CT imaging of the right knee was performed according to the standard protocol. Multiplanar CT image reconstructions were also generated. RADIATION DOSE REDUCTION: This exam was performed according to the departmental dose-optimization program which includes automated exposure control, adjustment of the mA and/or kV according to patient size and/or use of iterative reconstruction technique. COMPARISON:  12/21/2022. FINDINGS: Bones/Joint/Cartilage Total knee arthroplasty changes are noted. There is a minimally  displaced fracture of the medial femoral condyle with extension to the femoral hardware component. A tiny linear bony density is present along the superior aspect of the patella on the sagittal view, axial image 49. There are enthesopathic changes at the patella. No dislocation is seen. A moderate suprapatellar joint effusion is noted. Ligaments Suboptimally assessed by CT. Muscles and Tendons No gross abnormality is seen. Soft tissues Vascular calcifications are noted. Mild fat stranding is noted in the popliteal fossa IMPRESSION: 1. Status post total knee arthroplasty. There is a minimally displaced fracture of the medial femoral condyle with extension to the femoral component. 2. Linear calcification along the superior aspect of the patella on the sagittal view, which may be acute or chronic. 3. Moderate joint effusion. Electronically Signed   By: Thornell Sartorius M.D.   On: 12/22/2022 02:26   DG Knee Complete 4 Views Right  Result Date: 12/21/2022 CLINICAL DATA:  Fall, pain EXAM: RIGHT KNEE - COMPLETE 4+ VIEW COMPARISON:  None Available. FINDINGS: Status post right knee total arthroplasty. Nondisplaced periprosthetic fracture about the medial aspect of the femoral arthroplasty component with a displaced fragment or perhaps alternately heterotopic ossification in the adjacent soft tissues. Soft tissue edema anteriorly. IMPRESSION: 1. Generally nondisplaced periprosthetic fracture about the medial aspect of the femoral arthroplasty component. 2. Small displaced fracture fragment or perhaps alternately  heterotopic ossification in the adjacent soft tissues. Electronically Signed   By: Jearld Lesch M.D.   On: 12/21/2022 21:48      Subjective: Patient seen and examined at bedside.  No fever, vomiting, chest pain reported.  Complains of intermittent right knee pain.  Discharge Exam: Vitals:   12/24/22 2232 12/25/22 0619  BP: (!) 152/74 (!) 154/70  Pulse: 81 68  Resp: 18 18  Temp: 98.7 F (37.1 C) 98.1  F (36.7 C)  SpO2: 99% 98%    General: Pt is alert, awake, not in acute distress Cardiovascular: rate controlled, S1/S2 + Respiratory: bilateral decreased breath sounds at bases Abdominal: Soft,obese, NT, ND, bowel sounds + Extremities: no edema, no cyanosis    The results of significant diagnostics from this hospitalization (including imaging, microbiology, ancillary and laboratory) are listed below for reference.     Microbiology: No results found for this or any previous visit (from the past 240 hour(s)).   Labs: BNP (last 3 results) No results for input(s): "BNP" in the last 8760 hours. Basic Metabolic Panel: Recent Labs  Lab 12/22/22 0109  NA 137  K 4.3  CL 99  CO2 24  GLUCOSE 121*  BUN 23  CREATININE 1.02*  CALCIUM 9.2   Liver Function Tests: No results for input(s): "AST", "ALT", "ALKPHOS", "BILITOT", "PROT", "ALBUMIN" in the last 168 hours. No results for input(s): "LIPASE", "AMYLASE" in the last 168 hours. No results for input(s): "AMMONIA" in the last 168 hours. CBC: Recent Labs  Lab 12/22/22 0109 12/22/22 0854  WBC 8.7 6.3  NEUTROABS 5.7  --   HGB 11.2* 10.8*  HCT 34.0* 33.4*  MCV 87.9 89.5  PLT 215 207   Cardiac Enzymes: No results for input(s): "CKTOTAL", "CKMB", "CKMBINDEX", "TROPONINI" in the last 168 hours. BNP: Invalid input(s): "POCBNP" CBG: No results for input(s): "GLUCAP" in the last 168 hours. D-Dimer No results for input(s): "DDIMER" in the last 72 hours. Hgb A1c No results for input(s): "HGBA1C" in the last 72 hours. Lipid Profile No results for input(s): "CHOL", "HDL", "LDLCALC", "TRIG", "CHOLHDL", "LDLDIRECT" in the last 72 hours. Thyroid function studies No results for input(s): "TSH", "T4TOTAL", "T3FREE", "THYROIDAB" in the last 72 hours.  Invalid input(s): "FREET3" Anemia work up No results for input(s): "VITAMINB12", "FOLATE", "FERRITIN", "TIBC", "IRON", "RETICCTPCT" in the last 72 hours. Urinalysis    Component  Value Date/Time   COLORURINE AMBER (A) 02/19/2020 1930   APPEARANCEUR HAZY (A) 02/19/2020 1930   LABSPEC >1.046 (H) 02/19/2020 1930   PHURINE 5.0 02/19/2020 1930   GLUCOSEU NEGATIVE 02/19/2020 1930   HGBUR NEGATIVE 02/19/2020 1930   BILIRUBINUR NEGATIVE 02/19/2020 1930   KETONESUR 5 (A) 02/19/2020 1930   PROTEINUR NEGATIVE 02/19/2020 1930   UROBILINOGEN 0.2 10/14/2013 1835   NITRITE NEGATIVE 02/19/2020 1930   LEUKOCYTESUR TRACE (A) 02/19/2020 1930   Sepsis Labs Recent Labs  Lab 12/22/22 0109 12/22/22 0854  WBC 8.7 6.3   Microbiology No results found for this or any previous visit (from the past 240 hour(s)).   Time coordinating discharge: 35 minutes  SIGNED:   Glade Lloyd, MD  [facility] 12/25/2022, 8:12 AM

## 2022-12-26 ENCOUNTER — Inpatient Hospital Stay (HOSPITAL_COMMUNITY): Payer: Medicare Other

## 2022-12-26 ENCOUNTER — Encounter (HOSPITAL_COMMUNITY): Payer: Self-pay | Admitting: Internal Medicine

## 2022-12-26 DIAGNOSIS — I498 Other specified cardiac arrhythmias: Secondary | ICD-10-CM

## 2022-12-26 DIAGNOSIS — S7291XA Unspecified fracture of right femur, initial encounter for closed fracture: Secondary | ICD-10-CM | POA: Diagnosis not present

## 2022-12-26 DIAGNOSIS — R55 Syncope and collapse: Secondary | ICD-10-CM

## 2022-12-26 LAB — CBC
HCT: 34.6 % — ABNORMAL LOW (ref 36.0–46.0)
Hemoglobin: 11.3 g/dL — ABNORMAL LOW (ref 12.0–15.0)
MCH: 29.5 pg (ref 26.0–34.0)
MCHC: 32.7 g/dL (ref 30.0–36.0)
MCV: 90.3 fL (ref 80.0–100.0)
Platelets: 246 10*3/uL (ref 150–400)
RBC: 3.83 MIL/uL — ABNORMAL LOW (ref 3.87–5.11)
RDW: 14.5 % (ref 11.5–15.5)
WBC: 7.7 10*3/uL (ref 4.0–10.5)
nRBC: 0 % (ref 0.0–0.2)

## 2022-12-26 LAB — TROPONIN I (HIGH SENSITIVITY)
Troponin I (High Sensitivity): 5 ng/L (ref <18)
Troponin I (High Sensitivity): 4 ng/L (ref <18)

## 2022-12-26 LAB — ECHOCARDIOGRAM COMPLETE
Area-P 1/2: 3.36 cm2
Calc EF: 70.8 %
Height: 70 in
S' Lateral: 3.3 cm
Single Plane A2C EF: 65.5 %
Single Plane A4C EF: 75.2 %
Weight: 4544 [oz_av]

## 2022-12-26 LAB — MAGNESIUM: Magnesium: 2.2 mg/dL (ref 1.7–2.4)

## 2022-12-26 LAB — COMPREHENSIVE METABOLIC PANEL
ALT: 18 U/L (ref 0–44)
AST: 19 U/L (ref 15–41)
Albumin: 3.4 g/dL — ABNORMAL LOW (ref 3.5–5.0)
Alkaline Phosphatase: 55 U/L (ref 38–126)
Anion gap: 12 (ref 5–15)
BUN: 24 mg/dL — ABNORMAL HIGH (ref 8–23)
CO2: 25 mmol/L (ref 22–32)
Calcium: 9.4 mg/dL (ref 8.9–10.3)
Chloride: 101 mmol/L (ref 98–111)
Creatinine, Ser: 1.06 mg/dL — ABNORMAL HIGH (ref 0.44–1.00)
GFR, Estimated: 57 mL/min — ABNORMAL LOW (ref >60)
Glucose, Bld: 128 mg/dL — ABNORMAL HIGH (ref 70–99)
Potassium: 3.7 mmol/L (ref 3.5–5.1)
Sodium: 138 mmol/L (ref 135–145)
Total Bilirubin: 0.7 mg/dL (ref 0.3–1.2)
Total Protein: 6.6 g/dL (ref 6.5–8.1)

## 2022-12-26 LAB — GLUCOSE, CAPILLARY: Glucose-Capillary: 117 mg/dL — ABNORMAL HIGH (ref 70–99)

## 2022-12-26 LAB — MRSA NEXT GEN BY PCR, NASAL: MRSA by PCR Next Gen: NOT DETECTED

## 2022-12-26 MED ORDER — ENOXAPARIN SODIUM 60 MG/0.6ML IJ SOSY
60.0000 mg | PREFILLED_SYRINGE | Freq: Every day | INTRAMUSCULAR | Status: DC
  Administered 2022-12-26 – 2022-12-27 (×2): 60 mg via SUBCUTANEOUS
  Filled 2022-12-26 (×2): qty 0.6

## 2022-12-26 MED ORDER — CHLORHEXIDINE GLUCONATE CLOTH 2 % EX PADS
6.0000 | MEDICATED_PAD | Freq: Every day | CUTANEOUS | Status: DC
  Administered 2022-12-26 – 2022-12-27 (×2): 6 via TOPICAL

## 2022-12-26 MED ORDER — IOHEXOL 350 MG/ML SOLN
75.0000 mL | Freq: Once | INTRAVENOUS | Status: AC | PRN
  Administered 2022-12-26: 100 mL via INTRAVENOUS

## 2022-12-26 MED FILL — Medication: Qty: 1 | Status: AC

## 2022-12-26 NOTE — TOC Progression Note (Signed)
Transition of Care [facility]) - Progression Note   Patient Details  Name: Monique Gonzalez MRN: [id number] Date of Birth: 06/20/1955  [facility] CM/SW Contact  Ewing Schlein, LCSW Phone Number: 12/26/2022, 10:01 AM  Clinical Narrative: Due to code being called, patient will not be medically ready to discharge to [facility] today. CSW updated [place] in admissions at [place]. Per [place], weekend [facility] can reach out to her for a possible admission if the patient is medically stable at that time. TOC to follow.  Expected Discharge Plan: Skilled Nursing Facility Barriers to Discharge: Continued Medical Work up  Expected Discharge Plan and Services In-house Referral: Clinical Social Work Post Acute Care Choice: Skilled Nursing Facility Living arrangements for the past 2 months: Single Family Home Expected Discharge Date: 12/25/22               DME Arranged: N/A DME Agency: NA  Social Determinants of Health (SDOH) Interventions SDOH Screenings   Food Insecurity: No Food Insecurity (12/22/2022)  Housing: Low Risk  (12/22/2022)  Transportation Needs: No Transportation Needs (12/22/2022)  Utilities: Not At Risk (12/22/2022)  Alcohol Screen: Low Risk  (07/10/2022)  Depression (PHQ2-9): Low Risk  (07/10/2022)  Financial Resource Strain: Low Risk  (09/22/2022)  Physical Activity: Sufficiently Active (09/22/2022)  Recent Concern: Physical Activity - Inactive (07/10/2022)  Social Connections: Unknown (09/22/2022)  Stress: No Stress Concern Present (09/22/2022)  Recent Concern: Stress - Stress Concern Present (07/10/2022)  Tobacco Use: Medium Risk (12/21/2022)   Readmission Risk Interventions     No data to display

## 2022-12-26 NOTE — Progress Notes (Signed)
Patient assisted up to [facility] x1 w/ NT. While having BM, patient reported feeling dizzy, lightheaded. NT called for RN to assist. Attempted to acquire BP while patient on BSC. Patient alert and verbalizing at this time, stated "I'm having one of my GI episodes, this is what happens." RN asked patient if this happens at home, patient stated it does. RN asked if she feels the same as when it happens at home, patient stated it does and that she has seen her PCP, GI, and Cardiologist for it. Attempted x2 to take BP, failed both times. Informed patient that we need to assist her back to bed but patient stated that she was still moving her bowels.  Right after that statement patient lost consciousness and became limp. NT called for assistance and RN held patient up while attempting to rouse. Patient's eyes open, breathing suddenly ragged and agonal, not responding to sternal rub, unable to palpate pulse. Additional staff present, code blue activated. Patient lifted onto bed and chest compressions started. After about 15 compressions patient began moving arms and head. Blood glucose measured- 117. IVF started to left FA PIV. BP checked, above normal parameters. Patient able to verbalize, oriented, but feels extremely weak. New orders from MD, plan to transfer to stepdown unit.

## 2022-12-26 NOTE — Plan of Care (Signed)
[facility] Tonsillectomy/Adenoidectomy, Postoperative PEDS care plan entered in error.

## 2022-12-26 NOTE — Progress Notes (Signed)
  Echocardiogram 2D Echocardiogram has been performed.  Monique Gonzalez 12/26/2022, 12:29 PM

## 2022-12-26 NOTE — Progress Notes (Addendum)
PROGRESS NOTE    Monique Gonzalez  [id number] DOB: Jul 28, 1955 DOA: 12/21/2022 PCP: Donita Brooks, MD   Brief Narrative:  67 year old woman status post right TKA fell at home. Imaging revealed TKA periprosthetic distal femur fracture. Orthopedics recommended nonoperative management.  PT recommended SNF placement.    Assessment & Plan:   ?  Cardiac arrest Syncope:?  Vasovagal syncope -Patient was recently seen by cardiology earlier this month for dizziness/recurrent presyncopal episodes and 2D echo/Zio patch was ordered. -As per nursing staff, patient passed out this morning.  Patient apparently had agonal breathing, not responding to sternal rub but eyes were open and unable to palpate pulse as per nursing staff.  Unclear if patient lost pulse but patient underwent around 15 chest compressions.  Patient subsequently started moving arms and had.  Subsequently vitals including blood pressure was stable and patient was responsive.  I saw her immediately after code team had stopped doing compressions and she answered questions appropriately. -Possibly a vasovagal event.  Will get 2D echo, troponins and asked cardiology to see her.  I have communicated with cardiology team. -Will get CTA chest to rule out PE.  Closed right periprosthetic distal femoral fracture -Orthopedics recommended nonoperative management: Knee immobile EXTR and touchdown weightbearing on her right lower extremity. -Continue pain management.  Fall precautions. -PT recommended SNF placement.  TOC following.  Will hold discharge for today.  Depression -Continue venlafaxine  Chronic constipation -continue laxatives including Linzess  Morbid obesity -Outpatient follow-up   DVT prophylaxis: Lovenox Code Status: Full Family Communication: spoke to sister at bedside Disposition Plan: Status is: Inpatient Remains inpatient appropriate because: Of severity of illness.    Consultants:  Orthopedics/cardiology  Procedures: None  Antimicrobials: None   Subjective: Patient seen and examined at bedside.  She is awake and answers some questions appropriately although slow to respond.  Denies worsening shortness of breath, fever or vomiting.  Code team present around bedside. Objective: Vitals:   12/25/22 0619 12/25/22 2058 12/26/22 0622 12/26/22 1012  BP: (!) 154/70 (!) 160/88 131/69 (!) 141/68  Pulse: 68 87 91 78  Resp: 18 18 20 16   Temp: 98.1 F (36.7 C) 98.2 F (36.8 C) 97.7 F (36.5 C)   TempSrc: Oral Oral Oral   SpO2: 98% 99% 97% 91%  Weight:      Height:        Intake/Output Summary (Last 24 hours) at 12/26/2022 1118 Last data filed at 12/26/2022 0639 Gross per 24 hour  Intake 480 ml  Output 1600 ml  Net -1120 ml   Filed Weights   12/21/22 2111  Weight: 128.8 kg    Examination:  General: On room air.  No distress.  Looks chronically ill and deconditioned. ENT/neck: No thyromegaly.  JVD is not elevated  respiratory: Decreased breath sounds at bases bilaterally with some crackles; no wheezing  CVS: S1-S2 heard, rate controlled Abdominal: Soft, morbidly obese, nontender, slightly distended; no organomegaly, normal bowel sounds are heard Extremities: Trace lower extremity edema; no cyanosis  CNS: Awake, slow to respond but answers some questions appropriately.  No focal neurologic deficit.  Moves extremities Lymph: No obvious lymphadenopathy Skin: No obvious ecchymosis/lesions  psych: Flat affect.  Not agitated.   Musculoskeletal: No obvious joint swelling/deformity    Data Reviewed: I have personally reviewed following labs and imaging studies  CBC: Recent Labs  Lab 12/22/22 0109 12/22/22 0854 12/26/22 0951  WBC 8.7 6.3 7.7  NEUTROABS 5.7  --   --   HGB 11.2* 10.8*  11.3*  HCT 34.0* 33.4* 34.6*  MCV 87.9 89.5 90.3  PLT 215 207 246   Basic Metabolic Panel: Recent Labs  Lab 12/22/22 0109 12/26/22 0951  NA 137 138  K 4.3 3.7  CL  99 101  CO2 24 25  GLUCOSE 121* 128*  BUN 23 24*  CREATININE 1.02* 1.06*  CALCIUM 9.2 9.4  MG  --  2.2   GFR: Estimated Creatinine Clearance: 73.2 mL/min (A) (by C-G formula based on SCr of 1.06 mg/dL (H)). Liver Function Tests: Recent Labs  Lab 12/26/22 0951  AST 19  ALT 18  ALKPHOS 55  BILITOT 0.7  PROT 6.6  ALBUMIN 3.4*   No results for input(s): "LIPASE", "AMYLASE" in the last 168 hours. No results for input(s): "AMMONIA" in the last 168 hours. Coagulation Profile: No results for input(s): "INR", "PROTIME" in the last 168 hours. Cardiac Enzymes: No results for input(s): "CKTOTAL", "CKMB", "CKMBINDEX", "TROPONINI" in the last 168 hours. BNP (last 3 results) No results for input(s): "PROBNP" in the last 8760 hours. HbA1C: No results for input(s): "HGBA1C" in the last 72 hours. CBG: Recent Labs  Lab 12/26/22 0932  GLUCAP 117*   Lipid Profile: No results for input(s): "CHOL", "HDL", "LDLCALC", "TRIG", "CHOLHDL", "LDLDIRECT" in the last 72 hours. Thyroid Function Tests: No results for input(s): "TSH", "T4TOTAL", "FREET4", "T3FREE", "THYROIDAB" in the last 72 hours. Anemia Panel: No results for input(s): "VITAMINB12", "FOLATE", "FERRITIN", "TIBC", "IRON", "RETICCTPCT" in the last 72 hours. Sepsis Labs: No results for input(s): "PROCALCITON", "LATICACIDVEN" in the last 168 hours.  No results found for this or any previous visit (from the past 240 hour(s)).       Radiology Studies: No results found.      Scheduled Meds:  Chlorhexidine Gluconate Cloth  6 each Topical Daily   docusate sodium  100 mg Oral BID   enoxaparin (LOVENOX) injection  40 mg Subcutaneous Q24H   linaclotide  290 mcg Oral QAC breakfast   multivitamin with minerals  1 tablet Oral Daily   pantoprazole  40 mg Oral Daily   polyethylene glycol  17 g Oral Daily   venlafaxine XR  75 mg Oral Q breakfast   Continuous Infusions:        Glade Lloyd, MD [facility] 12/26/2022,  11:18 AM

## 2022-12-26 NOTE — Consult Note (Signed)
Cardiology Consultation   Patient ID: AARSHI PRECHTL MRN: [id number]; DOB: November 01, 1955  Admit date: 12/21/2022 Date of Consult: 12/26/2022  PCP:  Donita Brooks, MD   [facility] Providers Cardiologist:  Marlyn Corporal Madireddy, MD        Patient Profile:   Monique Gonzalez is a 67 y.o. female with a hx of obesity, HLD, prior right knee arthroplasty, chronic back pain, depression, anxiety, chronic constipation, morbid obesity, GERD, prior vasovagal syncope who is being seen 12/26/2022 for the evaluation of loss of consciousness at the request of Dr. Hanley Ben.  History of Present Illness:   Ms. [name] has a previous history of reported vasovagal syncope in the past with straining at defecation. We do not have any prior testing for review. She was recently evaluated by Dr. Vincent Gros 12/11/22 for episodes of dizziness spells with near-syncope post-exertion, relieved with lying down or sitting down and having her feet up. EKG had shown NSR with isolated PVC observed. Dr. Vincent Gros ordered echo and Zio, which were still pending. She already wore the monitor. It came off yesterday and her sister took it home to mail it off today, so we do not have any of the data available.  She was admitted to [facility] 12/21/22 with mechanical fall where she had lost her balance and injured her right knee. She was found to have periprosthetic fracture about the medial aspect of the femoral arthroplasty component. She was seen by orthopedics who recommended nonoperative management with knee immobilizer and touchdown weight bearing of RLE. She was pending discharge today and off telemetry when she had an episode of loss of consciousness. Per notes, "Patient assisted up to [facility] x1 w/ NT. While having BM, patient reported feeling dizzy, lightheaded. NT called for RN to assist. Attempted to acquire BP while patient on BSC. Patient alert and verbalizing at this time, stated "I'm having one of my GI episodes, this is what  happens." RN asked patient if this happens at home, patient stated it does. RN asked if she feels the same as when it happens at home, patient stated it does and that she has seen her PCP, GI, and Cardiologist for it. Attempted x2 to take BP, failed both times. Informed patient that we need to assist her back to bed but patient stated that she was still moving her bowels. Right after that statement patient lost consciousness and became limp. NT called for assistance and RN held patient up while attempting to rouse. Patient's eyes open, breathing suddenly ragged and agonal, not responding to sternal rub, unable to palpate pulse. Additional staff present, code blue activated. Patient lifted onto bed and chest compressions started. After about 15 compressions patient began moving arms and head. Blood glucose measured- 117. IVF started to left FA PIV. BP checked, above normal parameters. Patient able to verbalize, oriented, but feels extremely weak."  She was in NSR when Dr. Hanley Ben arrived and responding to questions. Labs showed stable Hgb 11.3, hsTroponin negx1, K 3.7, Cr 1.06, Mg 2.2. F/u CXR pending. EKG showed NSR 84bpm, isolated PVC, no acute change from previous. VSS except POx 91% RA. Echo pending. IM has also ordered CTA as Dr. Eden Emms suggested r/o PE.     Past Medical History:  Diagnosis Date   Allergy    Ankle fracture 11/09/2019   Anxiety    Phreesia 06/04/2020   Arthritis    Phreesia 07/08/2020   Chronic idiopathic constipation 07/05/2013   Chronic pain    back and  knee   Closed displaced fracture of styloid process of left ulna    Closed fracture of left distal radius 11/10/2019   Closed right ankle fracture 11/09/2019   Colitis 09/2012   infectious vs inflammatory.    Depression    Fall 11/10/2019   GERD (gastroesophageal reflux disease)    Phreesia 06/04/2020   Hyperlipidemia    Insulin resistance 10/15/2016   Ischemic stricture intestine (HCC)    Lumbar radiculopathy  09/26/2022   Near syncope 12/08/2022   Obesity    Osteoarthritis    Osteopenia    Presence of right artificial knee joint 10/14/2016   Recurrent cold sores    Seasonal allergies    Spondylolisthesis of lumbar region 07/22/2022   Status post lumbar spinal fusion 07/22/2022   L4-5     Vitamin D deficiency 04/08/2016    Past Surgical History:  Procedure Laterality Date   BIOPSY  02/21/2020   Procedure: BIOPSY;  Surgeon: Iva Boop, MD;  Location: [facility] ENDOSCOPY;  Service: Endoscopy;;   COLON RESECTION N/A 10/14/2013   Procedure: LAPAROSCOPIC MOBILIZATION OF SPLENIC FLEXURE, LAPAROSCOPIC EXTENDED LEFT COLECTOMY;  Surgeon: Atilano Ina, MD;  Location: [facility] OR;  Service: General;  Laterality: N/A;   COLON SURGERY N/A    Phreesia 06/04/2020   COLONOSCOPY N/A 10/12/2013   Procedure: COLONOSCOPY;  Surgeon: Beverley Fiedler, MD;  Location: [facility] ENDOSCOPY;  Service: Endoscopy;  Laterality: N/A;   COLONOSCOPY WITH PROPOFOL N/A 02/21/2020   Procedure: COLONOSCOPY WITH PROPOFOL;  Surgeon: Iva Boop, MD;  Location: [facility] ENDOSCOPY;  Service: Endoscopy;  Laterality: N/A;   DILATION AND CURETTAGE, DIAGNOSTIC / THERAPEUTIC  1987   OPEN REDUCTION INTERNAL FIXATION (ORIF) DISTAL RADIAL FRACTURE Right 08/08/2014   Procedure: OPEN REDUCTION INTERNAL FIXATION (ORIF) DISTAL RADIAL FRACTURE;  Surgeon: Dominica Severin, MD;  Location: [facility] OR;  Service: Orthopedics;  Laterality: Right;  DVR Crosslock, MD available sometime around 1430-1500   ORIF ANKLE FRACTURE Right 11/10/2019   Procedure: POSSIBLEOPEN REDUCTION INTERNAL FIXATION (ORIF) ANKLE FRACTURE;  Surgeon: Roby Lofts, MD;  Location: [facility] OR;  Service: Orthopedics;  Laterality: Right;   ORIF WRIST FRACTURE Left 11/10/2019   Procedure: OPEN REDUCTION INTERNAL FIXATION (ORIF) WRIST FRACTURE;  Surgeon: Roby Lofts, MD;  Location: [facility] OR;  Service: Orthopedics;  Laterality: Left;   TONSILLECTOMY     TOTAL KNEE ARTHROPLASTY Right 08/05/2016   Procedure:  RIGHT TOTAL KNEE ARTHROPLASTY;  Surgeon: Kathryne Hitch, MD;  Location: [facility] OR;  Service: Orthopedics;  Laterality: Right;     Home Medications:  Prior to Admission medications   Medication Sig Start Date End Date Taking? Authorizing Provider  acetaminophen (TYLENOL) 650 MG CR tablet Take 1,300 mg by mouth at bedtime.   Yes [provider]  atorvastatin (LIPITOR) 40 MG tablet TAKE 1 TABLET EVERY DAY Patient taking differently: Take 40 mg by mouth daily. 07/21/22  Yes Donita Brooks, MD  b complex vitamins capsule Take 1 capsule by mouth daily.   Yes [provider]  Calcium Carb-Cholecalciferol (CALCIUM 600+D3 PO) Take 1 tablet by mouth in the morning and at bedtime.   Yes [provider]  Cholecalciferol (VITAMIN D-3) 25 MCG (1000 UT) CAPS Take 1,000 Units by mouth daily with breakfast.   Yes [provider]  cyclobenzaprine (FLEXERIL) 10 MG tablet Take 1 tablet (10 mg total) by mouth 3 (three) times daily as needed for muscle spasms. 07/23/22  Yes Barnett Abu, MD  docusate sodium (COLACE) 100 MG capsule Take  1 capsule (100 mg total) by mouth 2 (two) times daily. 02/22/20  Yes Vann, Jessica U, DO  fluticasone (FLONASE) 50 MCG/ACT nasal spray Place 2 sprays into both nostrils at bedtime.   Yes [provider]  linaclotide Karlene Einstein) 290 MCG CAPS capsule Take 1 capsule (290 mcg total) by mouth daily 30 minutes before breakfast 01/08/22  Yes Lemmon, Violet Baldy, PA  loratadine (CLARITIN) 10 MG tablet Take 10 mg by mouth at bedtime.   Yes [provider]  meloxicam (MOBIC) 15 MG tablet Take 1 tablet (15 mg total) by mouth daily. 09/26/22  Yes Donita Brooks, MD  Multiple Vitamins-Minerals (ONE-A-DAY WOMENS PO) Take 1 tablet by mouth daily with breakfast.   Yes [provider]  Omega-3 Fatty Acids (FISH OIL) 1000 MG CAPS Take 1,000 mg by mouth 2 (two) times daily.   Yes [provider]  pantoprazole (PROTONIX) 40 MG  tablet TAKE 1 TABLET EVERY DAY Patient taking differently: Take 40 mg by mouth daily. 07/21/22  Yes Donita Brooks, MD  polyethylene glycol (MIRALAX / GLYCOLAX) 17 g packet Take 17 g by mouth daily.   Yes [provider]  Wheat Dextrin (BENEFIBER) POWD Take 1 Dose by mouth daily. 2 teaspoons   Yes [provider]  COVID-19 mRNA vaccine, Pfizer, (COMIRNATY) syringe Inject into the muscle. 12/15/22   Judyann Munson, MD  HYDROcodone-acetaminophen (NORCO/VICODIN) 5-325 MG tablet Take 1 tablet by mouth every 4 (four) hours as needed for moderate pain (pain score 4-6). 12/25/22   Glade Lloyd, MD  influenza vaccine adjuvanted (FLUAD) 0.5 ML injection Inject into the muscle. 12/15/22   Judyann Munson, MD  Melatonin 10 MG TABS Take 10 mg by mouth at bedtime.    [provider]  venlafaxine XR (EFFEXOR-XR) 75 MG 24 hr capsule Take 1 capsule (75 mg total) by mouth daily with breakfast. 12/25/22   Glade Lloyd, MD    Inpatient Medications: Scheduled Meds:  Chlorhexidine Gluconate Cloth  6 each Topical Daily   docusate sodium  100 mg Oral BID   enoxaparin (LOVENOX) injection  40 mg Subcutaneous Q24H   linaclotide  290 mcg Oral QAC breakfast   multivitamin with minerals  1 tablet Oral Daily   pantoprazole  40 mg Oral Daily   polyethylene glycol  17 g Oral Daily   venlafaxine XR  75 mg Oral Q breakfast   Continuous Infusions:  PRN Meds: acetaminophen, cyclobenzaprine, HYDROcodone-acetaminophen, morphine injection  Allergies:    Allergies  Allergen Reactions   Ciprofloxacin Itching, Dermatitis and Rash   Penicillins Rash   Sulfa Antibiotics Rash    Social History:   Social History   Socioeconomic History   Marital status: Divorced    Spouse name: Not on file   Number of children: 2   Years of education: college   Highest education level: Associate degree: occupational, Scientist, product/process development, or vocational program  Occupational History   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     Employer: [employer]    Comment: LPN  Tobacco Use   Smoking status: Former    Current packs/day: 0.00    Types: Cigarettes    Quit date: 10/27/1993    Years since quitting: 29.1   Smokeless tobacco: Never  Vaping Use   Vaping status: Never Used  Substance and Sexual Activity   Alcohol use: Yes    Comment: one drink per week   Drug use: No   Sexual activity: Not Currently  Other Topics Concern  Not on file  Social History Narrative   Not on file   Social Determinants of Health   Financial Resource Strain: Low Risk  (09/22/2022)   Overall Financial Resource Strain (CARDIA)    Difficulty of Paying Living Expenses: Not very hard  Food Insecurity: No Food Insecurity (12/22/2022)   Hunger Vital Sign    Worried About Running Out of Food in the Last Year: Never true    Ran Out of Food in the Last Year: Never true  Transportation Needs: No Transportation Needs (12/22/2022)   PRAPARE - Administrator, Civil Service (Medical): No    Lack of Transportation (Non-Medical): No  Physical Activity: Sufficiently Active (09/22/2022)   Exercise Vital Sign    Days of Exercise per Week: 7 days    Minutes of Exercise per Session: 40 min  Recent Concern: Physical Activity - Inactive (07/10/2022)   Exercise Vital Sign    Days of Exercise per Week: 0 days    Minutes of Exercise per Session: 0 min  Stress: No Stress Concern Present (09/22/2022)   [employer] of Occupational Health - Occupational Stress Questionnaire    Feeling of Stress : Only a little  Recent Concern: Stress - Stress Concern Present (07/10/2022)   [employer] of Occupational Health - Occupational Stress Questionnaire    Feeling of Stress : Rather much  Social Connections: Unknown (09/22/2022)   Social Connection and Isolation Panel [NHANES]    Frequency of Communication with Friends and Family: More than three times a week    Frequency of Social Gatherings with Friends and Family:  Patient declined    Attends Religious Services: Patient declined    Database administrator or Organizations: No    Attends Banker Meetings: Never    Marital Status: Divorced  Catering manager Violence: Not At Risk (12/22/2022)   Humiliation, Afraid, Rape, and Kick questionnaire    Fear of Current or Ex-Partner: No    Emotionally Abused: No    Physically Abused: No    Sexually Abused: No    Family History:    Family History  Problem Relation Age of Onset   Stroke Mother    Arthritis Mother    Thyroid disease Mother    Cancer Father    Arthritis Sister    Thyroid disease Sister    Cancer Maternal Grandmother    Stroke Maternal Grandfather    Cancer Maternal Grandfather    Colon cancer Maternal Grandfather    Cancer Paternal Grandmother    Heart disease Paternal Grandmother    Colon cancer Paternal Grandmother    Stroke Paternal Grandfather    Arthritis Sister    Obesity Sister    Sudden death Neg Hx    Hypertension Neg Hx    Hyperlipidemia Neg Hx    Heart attack Neg Hx    Diabetes Neg Hx    Rectal cancer Neg Hx    Stomach cancer Neg Hx    Esophageal cancer Neg Hx      ROS:  Please see the history of present illness.  All other ROS reviewed and negative.     Physical Exam/Data:   Vitals:   12/24/22 2232 12/25/22 0619 12/25/22 2058 12/26/22 0622  BP: (!) 152/74 (!) 154/70 (!) 160/88 131/69  Pulse: 81 68 87 91  Resp: 18 18 18 20   Temp: 98.7 F (37.1 C) 98.1 F (36.7 C) 98.2 F (36.8 C) 97.7 F (36.5 C)  TempSrc: Oral Oral Oral Oral  SpO2: 99% 98% 99% 97%  Weight:      Height:        Intake/Output Summary (Last 24 hours) at 12/26/2022 1051 Last data filed at 12/26/2022 0639 Gross per 24 hour  Intake 480 ml  Output 1600 ml  Net -1120 ml      12/21/2022    9:11 PM 12/11/2022    9:50 AM 10/21/2022    2:23 PM  Last 3 Weights  Weight (lbs) 284 lb 289 lb 1.3 oz 288 lb  Weight (kg) 128.822 kg 131.126 kg 130.636 kg     Body mass index is  40.75 kg/m.  Exam per MD  EKG:  The EKG was personally reviewed and demonstrates:   12/22/22 NSR 82bpm occasional PVCs, no acute STT changes Today EKG showed NSR 84bpm, isolated PVC, no acute change from previous.   Relevant CV Studies: Pending echo, monitor  Laboratory Data:  High Sensitivity Troponin:   Recent Labs  Lab 12/26/22 0951  TROPONINIHS 5     Chemistry Recent Labs  Lab 12/22/22 0109 12/26/22 0951  NA 137 138  K 4.3 3.7  CL 99 101  CO2 24 25  GLUCOSE 121* 128*  BUN 23 24*  CREATININE 1.02* 1.06*  CALCIUM 9.2 9.4  MG  --  2.2  GFRNONAA 60* 57*  ANIONGAP 14 12    Recent Labs  Lab 12/26/22 0951  PROT 6.6  ALBUMIN 3.4*  AST 19  ALT 18  ALKPHOS 55  BILITOT 0.7   Lipids No results for input(s): "CHOL", "TRIG", "HDL", "LABVLDL", "LDLCALC", "CHOLHDL" in the last 168 hours.  Hematology Recent Labs  Lab 12/22/22 0109 12/22/22 0854 12/26/22 0951  WBC 8.7 6.3 7.7  RBC 3.87 3.73* 3.83*  HGB 11.2* 10.8* 11.3*  HCT 34.0* 33.4* 34.6*  MCV 87.9 89.5 90.3  MCH 28.9 29.0 29.5  MCHC 32.9 32.3 32.7  RDW 14.6 14.6 14.5  PLT 215 207 246   Thyroid No results for input(s): "TSH", "FREET4" in the last 168 hours.  BNPNo results for input(s): "BNP", "PROBNP" in the last 168 hours.  DDimer No results for input(s): "DDIMER" in the last 168 hours.   Radiology/Studies:  No results found.   Assessment and Plan:   1. Syncope/loss of consciousness, with history of near-syncope pending OP workup as above 2. Occasional PVCs on EKG 3. Right periprosthetic knee fracture following mechanical fall managed conservatively  Note prepped remotely, MD to see.   Risk Assessment/Risk Scores:       For questions or updates, please contact [facility] Please consult [email or web address] for contact info under    Signed, Laurann Montana, PA-C  12/26/2022 10:51 AM   History and all data above reviewed.  Patient examined.  I agree with the findings as above.   The  patient has had a history of syncope.  She typically feels a prodrome of lightheadedness and knows something is going to happen.  It happens sometimes when she is standing for a long while.  It happens with stimuli such as constipation.  She has had it with pain in the past although that is not a typical trigger for her as she has had pain without symptoms.  Today she was having bowel movement as above.  This was the first time where he has been recorded that she did not have a pulse or blood pressure but others have not been attended to so quickly by medical experts.  She did wear the monitor for 2 weeks  and it has been turned in today but she did not have symptoms while wearing that.  She is not feeling palpitations.  She does not have chest pressure.  She does get some mild orthostatic symptoms at times.   She does try to stay hydrated.  GENERAL:  Well appearing HEENT:  Pupils equal round and reactive, fundi not visualized, oral mucosa unremarkable NECK:  No jugular venous distention, waveform within normal limits, carotid upstroke brisk and symmetric, no bruits, no thyromegaly LYMPHATICS:  No cervical, inguinal adenopathy LUNGS:  Clear to auscultation bilaterally BACK:  No CVA tenderness CHEST:  Unremarkable HEART:  PMI not displaced or sustained,S1 and S2 within normal limits, no S3, no S4, no clicks, no rubs, no murmurs ABD:  Flat, positive bowel sounds normal in frequency in pitch, no bruits, no rebound, no guarding, no midline pulsatile mass, no hepatomegaly, no splenomegaly EXT:  2 plus pulses throughout, no edema, no cyanosis no clubbing SKIN:  No rashes no nodules NEURO:  Cranial nerves II through XII grossly intact, motor grossly intact throughout PSYCH:  Cognitively intact, oriented to person place and time  All available labs, radiology testing, previous records reviewed. Agree with documented assessment and plan.   Syncope:  Preliminary echo with NL wall motion and normal EF.   Initial enzymes negative.   EKG without acute changes.  PVC on EKG.       Check orthostatic BPs.  Try to expedite the reading of the monitor although this will likely not be helpful since she did not have any events while wearing this.  Although I suspect this is vasovagal syncope she might ultimately need a loop implant.   If she has in the future any significant documented pauses she would be one of the rare patients I would consider pacing given the severity of her events.  Her limited data for beta-blockers midodrine and fludrocortisone and I do not think I would start the lives in this situation.  Certainly avoiding triggers such as constipation but she has issues with this and has been a longstanding problem.  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  She is currently not driving.   She is already on Effexor which can help.       Desaree Downen  1:12 PM  12/26/2022

## 2022-12-27 DIAGNOSIS — R55 Syncope and collapse: Secondary | ICD-10-CM | POA: Diagnosis not present

## 2022-12-27 DIAGNOSIS — S7291XA Unspecified fracture of right femur, initial encounter for closed fracture: Secondary | ICD-10-CM | POA: Diagnosis not present

## 2022-12-27 MED ORDER — HYDROCODONE-ACETAMINOPHEN 5-325 MG PO TABS: 1.0000 | ORAL_TABLET | ORAL | 0 refills | Status: DC | PRN

## 2022-12-27 MED ORDER — ATORVASTATIN CALCIUM 40 MG PO TABS
40.0000 mg | ORAL_TABLET | Freq: Every day | ORAL | Status: DC
  Administered 2022-12-27: 40 mg via ORAL
  Filled 2022-12-27: qty 1

## 2022-12-27 NOTE — TOC Transition Note (Signed)
Transition of Care [facility]) - CM/SW Discharge Note   Patient Details  Name: Monique Gonzalez MRN: [id number] Date of Birth: [birth year]  Transition of Care [facility] CM/SW Contact:  Otelia Santee, LCSW Phone Number: 12/27/2022, 11:20 AM   Clinical Narrative:    Pt to transfer to [facility] for ST SNF. Pt will be going to room 401. RN to call report to [phone]. Spoke with pt and sister in room and confirmed DC plan. DC packet placed at RN station. PTAR called for transportation at 11:10am.    Final next level of care: Skilled Nursing Facility Barriers to Discharge: Barriers Resolved   Patient Goals and CMS Choice CMS Medicare.gov Compare Post Acute Care list provided to:: Patient Choice offered to / list presented to : Patient  Discharge Placement     Existing PASRR number confirmed : 12/22/22          Patient chooses bed at: [facility] Patient to be transferred to facility by: [facility] Name of family member notified: Pt and sister Patient and family notified of of transfer: 12/27/22  Discharge Plan and Services Additional resources added to the After Visit Summary for   In-house Referral: Clinical Social Work   Post Acute Care Choice: Skilled Nursing Facility          DME Arranged: N/A DME Agency: NA                  Social Determinants of Health (SDOH) Interventions SDOH Screenings   Food Insecurity: No Food Insecurity (12/22/2022)  Housing: Low Risk  (12/22/2022)  Transportation Needs: No Transportation Needs (12/22/2022)  Utilities: Not At Risk (12/22/2022)  Alcohol Screen: Low Risk  (07/10/2022)  Depression (PHQ2-9): Low Risk  (07/10/2022)  Financial Resource Strain: Low Risk  (09/22/2022)  Physical Activity: Sufficiently Active (09/22/2022)  Recent Concern: Physical Activity - Inactive (07/10/2022)  Social Connections: Unknown (09/22/2022)  Stress: No Stress Concern Present (09/22/2022)  Recent Concern: Stress - Stress Concern Present (07/10/2022)  Tobacco Use:  Medium Risk (12/21/2022)     Readmission Risk Interventions    12/27/2022   11:19 AM  Readmission Risk Prevention Plan  Post Dischage Appt Complete  Medication Screening Complete  Transportation Screening Complete

## 2022-12-27 NOTE — Progress Notes (Signed)
Progress Note  Patient Name: ORIEL GAL Date of Encounter: 12/27/2022 Primary Cardiologist: Marlyn Corporal Madireddy, MD   Subjective   Overnight echo completed- no evidence of cardiac syncope. Patient notes her bariatric stricture and constipation history and her need for extensive therapy that GI manages. Notes in the past that severe pain will also trigger her events.  Vital Signs    Vitals:   12/27/22 0200 12/27/22 0300 12/27/22 0400 12/27/22 0500  BP: (!) 138/58 (!) 137/57 (!) 134/59 135/65  Pulse: 77 75 72 72  Resp: (!) 7 (!) 9 (!) 6 13  Temp:      TempSrc:      SpO2: 95% 94% 98% 97%  Weight:      Height:        Intake/Output Summary (Last 24 hours) at 12/27/2022 0732 Last data filed at 12/27/2022 0400 Gross per 24 hour  Intake --  Output 1375 ml  Net -1375 ml   Filed Weights   12/21/22 2111  Weight: 128.8 kg    Physical Exam   GEN: No acute distress.  Morbid obesity Neck: No JVD Cardiac: RRR, no murmurs, rubs, or gallops. Distant heart sounds Respiratory: Clear to auscultation bilaterally. GI: Soft, nontender, non-distended   Labs   Telemetry: SR with PVCs and artifact   Chemistry Recent Labs  Lab 12/22/22 0109 12/26/22 0951  NA 137 138  K 4.3 3.7  CL 99 101  CO2 24 25  GLUCOSE 121* 128*  BUN 23 24*  CREATININE 1.02* 1.06*  CALCIUM 9.2 9.4  PROT  --  6.6  ALBUMIN  --  3.4*  AST  --  19  ALT  --  18  ALKPHOS  --  55  BILITOT  --  0.7  GFRNONAA 60* 57*  ANIONGAP 14 12     Hematology Recent Labs  Lab 12/22/22 0109 12/22/22 0854 12/26/22 0951  WBC 8.7 6.3 7.7  RBC 3.87 3.73* 3.83*  HGB 11.2* 10.8* 11.3*  HCT 34.0* 33.4* 34.6*  MCV 87.9 89.5 90.3  MCH 28.9 29.0 29.5  MCHC 32.9 32.3 32.7  RDW 14.6 14.6 14.5  PLT 215 207 246    Cardiac EnzymesNo results for input(s): "TROPONINI" in the last 168 hours. No results for input(s): "TROPIPOC" in the last 168 hours.   BNPNo results for input(s): "BNP", "PROBNP" in the last 168  hours.   DDimer No results for input(s): "DDIMER" in the last 168 hours.   Cardiac Studies   Cardiac Studies & Procedures       ECHOCARDIOGRAM  ECHOCARDIOGRAM COMPLETE 12/26/2022  Narrative ECHOCARDIOGRAM REPORT    Patient Name:   TECKLA PHENG [facility] Date of Exam: 12/26/2022 Medical Rec #:  [id number]     Height:       70.0 in Accession #:    [id number]    Weight:       284.0 lb Date of Birth:  1956-01-14     BSA:          2.423 m Patient Age:    67 years      BP:           142/57 mmHg Patient Gender: F             HR:           78 bpm. Exam Location:  Inpatient  Procedure: 2D Echo, Cardiac Doppler and Color Doppler  STAT ECHO  Indications:    I49.9* Cardiac arrhythmia, unspecified. Cardiac arrest.  History:  Patient has no prior history of Echocardiogram examinations. Signs/Symptoms:Syncope.  Sonographer:    Sheralyn Boatman RDCS Referring Phys: [id number] [facility]   Sonographer Comments: Technically difficult study due to poor echo windows and patient is obese. Image acquisition challenging due to patient body habitus. Patient with broken leg, study done supine. IMPRESSIONS   1. Left ventricular ejection fraction, by estimation, is 60 to 65%. The left ventricle has normal function. The left ventricle has no regional wall motion abnormalities. There is mild left ventricular hypertrophy. Left ventricular diastolic parameters are consistent with Grade I diastolic dysfunction (impaired relaxation). 2. Right ventricular systolic function is normal. The right ventricular size is normal. Tricuspid regurgitation signal is inadequate for assessing PA pressure. 3. The mitral valve is normal in structure. No evidence of mitral valve regurgitation. No evidence of mitral stenosis. 4. The aortic valve was not well visualized. Aortic valve regurgitation is not visualized. No aortic stenosis is present. 5. The inferior vena cava is normal in size with greater than 50% respiratory  variability, suggesting right atrial pressure of 3 mmHg.  FINDINGS Left Ventricle: Left ventricular ejection fraction, by estimation, is 60 to 65%. The left ventricle has normal function. The left ventricle has no regional wall motion abnormalities. The left ventricular internal cavity size was normal in size. There is mild left ventricular hypertrophy. Left ventricular diastolic parameters are consistent with Grade I diastolic dysfunction (impaired relaxation).  Right Ventricle: The right ventricular size is normal. No increase in right ventricular wall thickness. Right ventricular systolic function is normal. Tricuspid regurgitation signal is inadequate for assessing PA pressure.  Left Atrium: Left atrial size was normal in size.  Right Atrium: Right atrial size was normal in size.  Pericardium: Trivial pericardial effusion is present. Presence of epicardial fat layer.  Mitral Valve: The mitral valve is normal in structure. No evidence of mitral valve regurgitation. No evidence of mitral valve stenosis.  Tricuspid Valve: The tricuspid valve is normal in structure. Tricuspid valve regurgitation is trivial.  Aortic Valve: The aortic valve was not well visualized. Aortic valve regurgitation is not visualized. No aortic stenosis is present.  Pulmonic Valve: The pulmonic valve was not well visualized. Pulmonic valve regurgitation is not visualized.  Aorta: The aortic root and ascending aorta are structurally normal, with no evidence of dilitation.  Venous: The inferior vena cava is normal in size with greater than 50% respiratory variability, suggesting right atrial pressure of 3 mmHg.  IAS/Shunts: The interatrial septum was not well visualized.   LEFT VENTRICLE PLAX 2D LVIDd:         4.80 cm     Diastology LVIDs:         3.30 cm     LV e' medial:    7.51 cm/s LV PW:         1.10 cm     LV E/e' medial:  6.3 LV IVS:        1.30 cm     LV e' lateral:   4.13 cm/s LVOT diam:     2.30 cm      LV E/e' lateral: 11.5 LV SV:         69 LV SV Index:   28 LVOT Area:     4.15 cm  LV Volumes (MOD) LV vol d, MOD A2C: 80.1 ml LV vol d, MOD A4C: 61.3 ml LV vol s, MOD A2C: 27.6 ml LV vol s, MOD A4C: 15.2 ml LV SV MOD A2C:     52.5 ml LV  SV MOD A4C:     61.3 ml LV SV MOD BP:      50.0 ml  RIGHT VENTRICLE             IVC RV S prime:     13.20 cm/s  IVC diam: 1.70 cm TAPSE (M-mode): 2.0 cm  LEFT ATRIUM           Index        RIGHT ATRIUM          Index LA diam:      4.00 cm 1.65 cm/m   RA Area:     8.59 cm LA Vol (A2C): 43.1 ml 17.79 ml/m  RA Volume:   13.50 ml 5.57 ml/m LA Vol (A4C): 40.3 ml 16.63 ml/m AORTIC VALVE LVOT Vmax:   83.90 cm/s LVOT Vmean:  56.300 cm/s LVOT VTI:    0.165 m  AORTA Ao Root diam: 3.50 cm Ao Asc diam:  3.50 cm  MITRAL VALVE MV Area (PHT): 3.36 cm    SHUNTS MV Decel Time: 226 msec    Systemic VTI:  0.16 m MV E velocity: 47.35 cm/s  Systemic Diam: 2.30 cm MV A velocity: 66.65 cm/s MV E/A ratio:  0.71  Epifanio Lesches MD Electronically signed by Epifanio Lesches MD Signature Date/Time: 12/26/2022/4:18:21 PM    Final                  Assessment & Plan   Vasovagal Syncope -  on no exacerbating mediations (Diuretics, SSRI, TCA, Vasodilations (alpha blocker, nitrates, CCB) - Potential situational vagal mediated syncope (defecation, pain, heat) -- discussed the importants of hydration and fiber to decrease straining and staying on her GI constipation plan (defer specifics to GI and or TRH) - arrhythmogenic risk low (rare PVCs, Pending results from event monitor, I would not start AV nodal agent) - complicated by recent lumbar spinal surgery May 2024 and some near syncope with pain and rehab -with  no structural heart disease noted  (Severe AS, HCM, cardiac mass, PAH)  HLD - continue statin  We will sign off at this time Would cancel 01/15/23 echo    For questions or updates, please contact [facility] Please  consult [email or web address] for contact info under Cardiology/STEMI.      Riley Lam, MD FASE [facility] Cardiologist [facility]  [address] [place], [address] [place], [place] [address] [phone]  7:32 AM

## 2022-12-27 NOTE — Discharge Summary (Signed)
Physician Discharge Summary  Monique Gonzalez WGN:[id number] DOB: Apr 29, 1955 DOA: 12/21/2022  PCP: Donita Brooks, MD  Admit date: 12/21/2022 Discharge date: 12/27/2022  Admitted From: Home Disposition: SNF  Recommendations for Outpatient Follow-up:  Follow up with SNF provider at earliest convenience Outpatient follow-up with orthopedics.  Activity as per orthopedics recommendations Outpatient follow-up with cardiology Follow up in ED if symptoms worsen or new appear   Home Health: No Equipment/Devices: None  Discharge Condition: Stable CODE STATUS: Full Diet recommendation: Heart healthy  Brief/Interim Summary: 67 year old woman status post right TKA fell at home. Imaging revealed TKA periprosthetic distal femur fracture. Orthopedics recommended nonoperative management.  PT recommended SNF placement.  She had a possible vasovagal syncope on 12/26/2022: Cardiology was consulted; echo and CTA was unremarkable.  Cardiology has cleared the patient for discharge.  Patient feels okay to be discharged to SNF today.  Discharge to SNF today if bed is available.  Discharge Diagnoses:   Closed right periprosthetic distal femoral fracture -Orthopedics recommended nonoperative management: Knee immobilizer and touchdown weightbearing on her right lower extremity. -Continue pain management.  Fall precautions. -PT recommended SNF placement.  Currently medically stable for SNF.   -Outpatient follow-up with orthopedics.  Discharge to SNF once bed is available.  Possible vasovagal syncope --Patient was recently seen by cardiology earlier this month for dizziness/recurrent presyncopal episodes and 2D echo/Zio patch was ordered.  -She had a possible vasovagal syncope on 12/26/2022 (passed out while having bowel movement; CODE BLUE was called and patient underwent around 15 chest compressions, unsure if she ever lost pulse; was awake afterwards with no focal neurologic deficits): Cardiology was  consulted; echo and CTA was unremarkable.  Cardiology has cleared the patient for discharge.  Outpatient follow-up with cardiology   Depression -Continue venlafaxine   Chronic constipation -continue laxatives including Linzess   Morbid obesity -Outpatient follow-up   Discharge Instructions  Discharge Instructions     Diet - low sodium heart healthy   Complete by: As directed    Discharge patient   Complete by: As directed    Discharge disposition: 03-Skilled Nursing Facility   Discharge patient date: 12/27/2022   Increase activity slowly   Complete by: As directed       Allergies as of 12/27/2022       Reactions   Ciprofloxacin Itching, Dermatitis, Rash   Penicillins Rash   Sulfa Antibiotics Rash        Medication List     STOP taking these medications    Comirnaty syringe Generic drug: COVID-19 mRNA vaccine (Pfizer)   Fluad 0.5 ML injection Generic drug: influenza vaccine adjuvanted       TAKE these medications    acetaminophen 650 MG CR tablet Commonly known as: TYLENOL Take 1,300 mg by mouth at bedtime.   atorvastatin 40 MG tablet Commonly known as: LIPITOR TAKE 1 TABLET EVERY DAY   b complex vitamins capsule Take 1 capsule by mouth daily.   Benefiber Powd Take 1 Dose by mouth daily. 2 teaspoons   CALCIUM 600+D3 PO Take 1 tablet by mouth in the morning and at bedtime.   cyclobenzaprine 10 MG tablet Commonly known as: FLEXERIL Take 1 tablet (10 mg total) by mouth 3 (three) times daily as needed for muscle spasms.   docusate sodium 100 MG capsule Commonly known as: COLACE Take 1 capsule (100 mg total) by mouth 2 (two) times daily.   Fish Oil 1000 MG Caps Take 1,000 mg by mouth 2 (two) times daily.   fluticasone  Physician Discharge Summary  Monique Gonzalez WGN:[id number] DOB: Apr 29, 1955 DOA: 12/21/2022  PCP: Donita Brooks, MD  Admit date: 12/21/2022 Discharge date: 12/27/2022  Admitted From: Home Disposition: SNF  Recommendations for Outpatient Follow-up:  Follow up with SNF provider at earliest convenience Outpatient follow-up with orthopedics.  Activity as per orthopedics recommendations Outpatient follow-up with cardiology Follow up in ED if symptoms worsen or new appear   Home Health: No Equipment/Devices: None  Discharge Condition: Stable CODE STATUS: Full Diet recommendation: Heart healthy  Brief/Interim Summary: 67 year old woman status post right TKA fell at home. Imaging revealed TKA periprosthetic distal femur fracture. Orthopedics recommended nonoperative management.  PT recommended SNF placement.  She had a possible vasovagal syncope on 12/26/2022: Cardiology was consulted; echo and CTA was unremarkable.  Cardiology has cleared the patient for discharge.  Patient feels okay to be discharged to SNF today.  Discharge to SNF today if bed is available.  Discharge Diagnoses:   Closed right periprosthetic distal femoral fracture -Orthopedics recommended nonoperative management: Knee immobilizer and touchdown weightbearing on her right lower extremity. -Continue pain management.  Fall precautions. -PT recommended SNF placement.  Currently medically stable for SNF.   -Outpatient follow-up with orthopedics.  Discharge to SNF once bed is available.  Possible vasovagal syncope --Patient was recently seen by cardiology earlier this month for dizziness/recurrent presyncopal episodes and 2D echo/Zio patch was ordered.  -She had a possible vasovagal syncope on 12/26/2022 (passed out while having bowel movement; CODE BLUE was called and patient underwent around 15 chest compressions, unsure if she ever lost pulse; was awake afterwards with no focal neurologic deficits): Cardiology was  consulted; echo and CTA was unremarkable.  Cardiology has cleared the patient for discharge.  Outpatient follow-up with cardiology   Depression -Continue venlafaxine   Chronic constipation -continue laxatives including Linzess   Morbid obesity -Outpatient follow-up   Discharge Instructions  Discharge Instructions     Diet - low sodium heart healthy   Complete by: As directed    Discharge patient   Complete by: As directed    Discharge disposition: 03-Skilled Nursing Facility   Discharge patient date: 12/27/2022   Increase activity slowly   Complete by: As directed       Allergies as of 12/27/2022       Reactions   Ciprofloxacin Itching, Dermatitis, Rash   Penicillins Rash   Sulfa Antibiotics Rash        Medication List     STOP taking these medications    Comirnaty syringe Generic drug: COVID-19 mRNA vaccine (Pfizer)   Fluad 0.5 ML injection Generic drug: influenza vaccine adjuvanted       TAKE these medications    acetaminophen 650 MG CR tablet Commonly known as: TYLENOL Take 1,300 mg by mouth at bedtime.   atorvastatin 40 MG tablet Commonly known as: LIPITOR TAKE 1 TABLET EVERY DAY   b complex vitamins capsule Take 1 capsule by mouth daily.   Benefiber Powd Take 1 Dose by mouth daily. 2 teaspoons   CALCIUM 600+D3 PO Take 1 tablet by mouth in the morning and at bedtime.   cyclobenzaprine 10 MG tablet Commonly known as: FLEXERIL Take 1 tablet (10 mg total) by mouth 3 (three) times daily as needed for muscle spasms.   docusate sodium 100 MG capsule Commonly known as: COLACE Take 1 capsule (100 mg total) by mouth 2 (two) times daily.   Fish Oil 1000 MG Caps Take 1,000 mg by mouth 2 (two) times daily.   fluticasone  the above findings. IMPRESSION: No evidence of pulmonary embolism. Stable left adrenal nodule dating back to 2021 consistent with a benign etiology. It measures 12 Hounsfield units and likely represents a benign adenoma. No follow-up is recommended. Aortic Atherosclerosis (ICD10-I70.0). Electronically Signed   By: Alcide Clever M.D.   On: 12/26/2022 17:30   ECHOCARDIOGRAM COMPLETE  Result Date: 12/26/2022    ECHOCARDIOGRAM REPORT   Patient Name:   Monique Gonzalez [facility] Date of Exam: 12/26/2022 Medical Rec #:  [id number]      Height:       70.0 in Accession #:    [id number]    Weight:       284.0 lb Date of Birth:  Jul 16, 1955     BSA:          2.423 m Patient Age:    67 years      BP:           142/57 mmHg Patient Gender: F             HR:           78 bpm. Exam Location:  Inpatient Procedure: 2D Echo, Cardiac Doppler and Color Doppler STAT ECHO Indications:    I49.9* Cardiac arrhythmia, unspecified. Cardiac arrest.  History:        Patient has no prior history of Echocardiogram examinations.                 Signs/Symptoms:Syncope.  Sonographer:    Sheralyn Boatman RDCS Referring Phys: [id number] [facility]  Sonographer Comments: Technically difficult study due to poor echo windows and patient is obese. Image acquisition challenging due to patient body habitus. Patient with broken leg, study done supine. IMPRESSIONS  1. Left ventricular ejection fraction, by estimation, is 60 to 65%. The left ventricle has normal function. The left ventricle has no regional wall motion abnormalities. There is mild left ventricular hypertrophy. Left ventricular diastolic parameters are consistent with Grade I diastolic dysfunction (impaired relaxation).  2. Right ventricular systolic function is normal. The right ventricular size is normal. Tricuspid regurgitation signal is inadequate for assessing PA pressure.  3. The mitral valve is normal in structure. No evidence of mitral valve regurgitation. No evidence of mitral stenosis.  4. The aortic valve was not well visualized. Aortic valve regurgitation is not visualized. No aortic stenosis is present.  5. The inferior vena cava is normal in size with greater than 50% respiratory variability, suggesting right atrial pressure of 3 mmHg. FINDINGS  Left Ventricle: Left ventricular ejection fraction, by estimation, is 60 to 65%. The left ventricle has normal function. The left ventricle has no regional wall motion abnormalities. The left ventricular internal cavity size was normal in size. There is  mild left  ventricular hypertrophy. Left ventricular diastolic parameters are consistent with Grade I diastolic dysfunction (impaired relaxation). Right Ventricle: The right ventricular size is normal. No increase in right ventricular wall thickness. Right ventricular systolic function is normal. Tricuspid regurgitation signal is inadequate for assessing PA pressure. Left Atrium: Left atrial size was normal in size. Right Atrium: Right atrial size was normal in size. Pericardium: Trivial pericardial effusion is present. Presence of epicardial fat layer. Mitral Valve: The mitral valve is normal in structure. No evidence of mitral valve regurgitation. No evidence of mitral valve stenosis. Tricuspid Valve: The tricuspid valve is normal in structure. Tricuspid valve regurgitation is trivial. Aortic Valve: The aortic valve was not well visualized. Aortic valve regurgitation is not visualized. No aortic stenosis is present. Pulmonic  Physician Discharge Summary  Monique Gonzalez WGN:[id number] DOB: Apr 29, 1955 DOA: 12/21/2022  PCP: Donita Brooks, MD  Admit date: 12/21/2022 Discharge date: 12/27/2022  Admitted From: Home Disposition: SNF  Recommendations for Outpatient Follow-up:  Follow up with SNF provider at earliest convenience Outpatient follow-up with orthopedics.  Activity as per orthopedics recommendations Outpatient follow-up with cardiology Follow up in ED if symptoms worsen or new appear   Home Health: No Equipment/Devices: None  Discharge Condition: Stable CODE STATUS: Full Diet recommendation: Heart healthy  Brief/Interim Summary: 67 year old woman status post right TKA fell at home. Imaging revealed TKA periprosthetic distal femur fracture. Orthopedics recommended nonoperative management.  PT recommended SNF placement.  She had a possible vasovagal syncope on 12/26/2022: Cardiology was consulted; echo and CTA was unremarkable.  Cardiology has cleared the patient for discharge.  Patient feels okay to be discharged to SNF today.  Discharge to SNF today if bed is available.  Discharge Diagnoses:   Closed right periprosthetic distal femoral fracture -Orthopedics recommended nonoperative management: Knee immobilizer and touchdown weightbearing on her right lower extremity. -Continue pain management.  Fall precautions. -PT recommended SNF placement.  Currently medically stable for SNF.   -Outpatient follow-up with orthopedics.  Discharge to SNF once bed is available.  Possible vasovagal syncope --Patient was recently seen by cardiology earlier this month for dizziness/recurrent presyncopal episodes and 2D echo/Zio patch was ordered.  -She had a possible vasovagal syncope on 12/26/2022 (passed out while having bowel movement; CODE BLUE was called and patient underwent around 15 chest compressions, unsure if she ever lost pulse; was awake afterwards with no focal neurologic deficits): Cardiology was  consulted; echo and CTA was unremarkable.  Cardiology has cleared the patient for discharge.  Outpatient follow-up with cardiology   Depression -Continue venlafaxine   Chronic constipation -continue laxatives including Linzess   Morbid obesity -Outpatient follow-up   Discharge Instructions  Discharge Instructions     Diet - low sodium heart healthy   Complete by: As directed    Discharge patient   Complete by: As directed    Discharge disposition: 03-Skilled Nursing Facility   Discharge patient date: 12/27/2022   Increase activity slowly   Complete by: As directed       Allergies as of 12/27/2022       Reactions   Ciprofloxacin Itching, Dermatitis, Rash   Penicillins Rash   Sulfa Antibiotics Rash        Medication List     STOP taking these medications    Comirnaty syringe Generic drug: COVID-19 mRNA vaccine (Pfizer)   Fluad 0.5 ML injection Generic drug: influenza vaccine adjuvanted       TAKE these medications    acetaminophen 650 MG CR tablet Commonly known as: TYLENOL Take 1,300 mg by mouth at bedtime.   atorvastatin 40 MG tablet Commonly known as: LIPITOR TAKE 1 TABLET EVERY DAY   b complex vitamins capsule Take 1 capsule by mouth daily.   Benefiber Powd Take 1 Dose by mouth daily. 2 teaspoons   CALCIUM 600+D3 PO Take 1 tablet by mouth in the morning and at bedtime.   cyclobenzaprine 10 MG tablet Commonly known as: FLEXERIL Take 1 tablet (10 mg total) by mouth 3 (three) times daily as needed for muscle spasms.   docusate sodium 100 MG capsule Commonly known as: COLACE Take 1 capsule (100 mg total) by mouth 2 (two) times daily.   Fish Oil 1000 MG Caps Take 1,000 mg by mouth 2 (two) times daily.   fluticasone  the above findings. IMPRESSION: No evidence of pulmonary embolism. Stable left adrenal nodule dating back to 2021 consistent with a benign etiology. It measures 12 Hounsfield units and likely represents a benign adenoma. No follow-up is recommended. Aortic Atherosclerosis (ICD10-I70.0). Electronically Signed   By: Alcide Clever M.D.   On: 12/26/2022 17:30   ECHOCARDIOGRAM COMPLETE  Result Date: 12/26/2022    ECHOCARDIOGRAM REPORT   Patient Name:   Monique Gonzalez [facility] Date of Exam: 12/26/2022 Medical Rec #:  [id number]      Height:       70.0 in Accession #:    [id number]    Weight:       284.0 lb Date of Birth:  Jul 16, 1955     BSA:          2.423 m Patient Age:    67 years      BP:           142/57 mmHg Patient Gender: F             HR:           78 bpm. Exam Location:  Inpatient Procedure: 2D Echo, Cardiac Doppler and Color Doppler STAT ECHO Indications:    I49.9* Cardiac arrhythmia, unspecified. Cardiac arrest.  History:        Patient has no prior history of Echocardiogram examinations.                 Signs/Symptoms:Syncope.  Sonographer:    Sheralyn Boatman RDCS Referring Phys: [id number] [facility]  Sonographer Comments: Technically difficult study due to poor echo windows and patient is obese. Image acquisition challenging due to patient body habitus. Patient with broken leg, study done supine. IMPRESSIONS  1. Left ventricular ejection fraction, by estimation, is 60 to 65%. The left ventricle has normal function. The left ventricle has no regional wall motion abnormalities. There is mild left ventricular hypertrophy. Left ventricular diastolic parameters are consistent with Grade I diastolic dysfunction (impaired relaxation).  2. Right ventricular systolic function is normal. The right ventricular size is normal. Tricuspid regurgitation signal is inadequate for assessing PA pressure.  3. The mitral valve is normal in structure. No evidence of mitral valve regurgitation. No evidence of mitral stenosis.  4. The aortic valve was not well visualized. Aortic valve regurgitation is not visualized. No aortic stenosis is present.  5. The inferior vena cava is normal in size with greater than 50% respiratory variability, suggesting right atrial pressure of 3 mmHg. FINDINGS  Left Ventricle: Left ventricular ejection fraction, by estimation, is 60 to 65%. The left ventricle has normal function. The left ventricle has no regional wall motion abnormalities. The left ventricular internal cavity size was normal in size. There is  mild left  ventricular hypertrophy. Left ventricular diastolic parameters are consistent with Grade I diastolic dysfunction (impaired relaxation). Right Ventricle: The right ventricular size is normal. No increase in right ventricular wall thickness. Right ventricular systolic function is normal. Tricuspid regurgitation signal is inadequate for assessing PA pressure. Left Atrium: Left atrial size was normal in size. Right Atrium: Right atrial size was normal in size. Pericardium: Trivial pericardial effusion is present. Presence of epicardial fat layer. Mitral Valve: The mitral valve is normal in structure. No evidence of mitral valve regurgitation. No evidence of mitral valve stenosis. Tricuspid Valve: The tricuspid valve is normal in structure. Tricuspid valve regurgitation is trivial. Aortic Valve: The aortic valve was not well visualized. Aortic valve regurgitation is not visualized. No aortic stenosis is present. Pulmonic  the above findings. IMPRESSION: No evidence of pulmonary embolism. Stable left adrenal nodule dating back to 2021 consistent with a benign etiology. It measures 12 Hounsfield units and likely represents a benign adenoma. No follow-up is recommended. Aortic Atherosclerosis (ICD10-I70.0). Electronically Signed   By: Alcide Clever M.D.   On: 12/26/2022 17:30   ECHOCARDIOGRAM COMPLETE  Result Date: 12/26/2022    ECHOCARDIOGRAM REPORT   Patient Name:   Monique Gonzalez [facility] Date of Exam: 12/26/2022 Medical Rec #:  [id number]      Height:       70.0 in Accession #:    [id number]    Weight:       284.0 lb Date of Birth:  Jul 16, 1955     BSA:          2.423 m Patient Age:    67 years      BP:           142/57 mmHg Patient Gender: F             HR:           78 bpm. Exam Location:  Inpatient Procedure: 2D Echo, Cardiac Doppler and Color Doppler STAT ECHO Indications:    I49.9* Cardiac arrhythmia, unspecified. Cardiac arrest.  History:        Patient has no prior history of Echocardiogram examinations.                 Signs/Symptoms:Syncope.  Sonographer:    Sheralyn Boatman RDCS Referring Phys: [id number] [facility]  Sonographer Comments: Technically difficult study due to poor echo windows and patient is obese. Image acquisition challenging due to patient body habitus. Patient with broken leg, study done supine. IMPRESSIONS  1. Left ventricular ejection fraction, by estimation, is 60 to 65%. The left ventricle has normal function. The left ventricle has no regional wall motion abnormalities. There is mild left ventricular hypertrophy. Left ventricular diastolic parameters are consistent with Grade I diastolic dysfunction (impaired relaxation).  2. Right ventricular systolic function is normal. The right ventricular size is normal. Tricuspid regurgitation signal is inadequate for assessing PA pressure.  3. The mitral valve is normal in structure. No evidence of mitral valve regurgitation. No evidence of mitral stenosis.  4. The aortic valve was not well visualized. Aortic valve regurgitation is not visualized. No aortic stenosis is present.  5. The inferior vena cava is normal in size with greater than 50% respiratory variability, suggesting right atrial pressure of 3 mmHg. FINDINGS  Left Ventricle: Left ventricular ejection fraction, by estimation, is 60 to 65%. The left ventricle has normal function. The left ventricle has no regional wall motion abnormalities. The left ventricular internal cavity size was normal in size. There is  mild left  ventricular hypertrophy. Left ventricular diastolic parameters are consistent with Grade I diastolic dysfunction (impaired relaxation). Right Ventricle: The right ventricular size is normal. No increase in right ventricular wall thickness. Right ventricular systolic function is normal. Tricuspid regurgitation signal is inadequate for assessing PA pressure. Left Atrium: Left atrial size was normal in size. Right Atrium: Right atrial size was normal in size. Pericardium: Trivial pericardial effusion is present. Presence of epicardial fat layer. Mitral Valve: The mitral valve is normal in structure. No evidence of mitral valve regurgitation. No evidence of mitral valve stenosis. Tricuspid Valve: The tricuspid valve is normal in structure. Tricuspid valve regurgitation is trivial. Aortic Valve: The aortic valve was not well visualized. Aortic valve regurgitation is not visualized. No aortic stenosis is present. Pulmonic

## 2022-12-27 NOTE — Progress Notes (Signed)
PT DC via gurney with PTAR. Vss, all belongings returned to patient to her satisfaction.

## 2022-12-27 NOTE — Progress Notes (Signed)
Report called to [place] at [facility].

## 2022-12-27 NOTE — Plan of Care (Signed)
Pt transfer to [facility] via ptar

## 2023-01-01 DIAGNOSIS — K5903 Drug induced constipation: Secondary | ICD-10-CM | POA: Diagnosis not present

## 2023-01-01 DIAGNOSIS — S72301D Unspecified fracture of shaft of right femur, subsequent encounter for closed fracture with routine healing: Secondary | ICD-10-CM | POA: Diagnosis not present

## 2023-01-01 DIAGNOSIS — F3342 Major depressive disorder, recurrent, in full remission: Secondary | ICD-10-CM | POA: Diagnosis not present

## 2023-01-05 DIAGNOSIS — R0602 Shortness of breath: Secondary | ICD-10-CM

## 2023-01-05 DIAGNOSIS — S72301D Unspecified fracture of shaft of right femur, subsequent encounter for closed fracture with routine healing: Secondary | ICD-10-CM

## 2023-01-05 DIAGNOSIS — K5903 Drug induced constipation: Secondary | ICD-10-CM

## 2023-01-05 DIAGNOSIS — R072 Precordial pain: Secondary | ICD-10-CM

## 2023-01-05 DIAGNOSIS — R600 Localized edema: Secondary | ICD-10-CM

## 2023-01-05 DIAGNOSIS — F3342 Major depressive disorder, recurrent, in full remission: Secondary | ICD-10-CM

## 2023-01-08 ENCOUNTER — Other Ambulatory Visit (INDEPENDENT_AMBULATORY_CARE_PROVIDER_SITE_OTHER): Payer: Medicare Other

## 2023-01-08 ENCOUNTER — Ambulatory Visit: Payer: Medicare Other | Admitting: Orthopaedic Surgery

## 2023-01-08 ENCOUNTER — Encounter: Payer: Self-pay | Admitting: Orthopaedic Surgery

## 2023-01-08 DIAGNOSIS — M9711XD Periprosthetic fracture around internal prosthetic right knee joint, subsequent encounter: Secondary | ICD-10-CM | POA: Diagnosis not present

## 2023-01-08 DIAGNOSIS — Z96651 Presence of right artificial knee joint: Secondary | ICD-10-CM

## 2023-01-08 NOTE — Progress Notes (Signed)
The patient is getting close to 3 weeks status post a mechanical fall in which she sustained a nondisplaced medial epicondylar fracture of the femur around a total knee arthroplasty.  There is no evidence of loosening of the arthroplasty.  This was an accidental fall.  She reports only some mild pain and she has been nonweightbearing in a hinged knee brace.  She reports this point minimal pain.  On exam she has excellent flexion and extension of her right knee.  She is able to compress against my resistance her knee and there is just some mild pain.  There is very mild pain to palpation as well along the fracture line of the medial femoral condyle.  The knee is ligamentously stable.    2 views of the right knee show well-seated total knee arthroplasty with no complicating features.  The medial epicondyle fracture remains nondisplaced and shows interval healing.  At this point she can stop the hinged knee brace and can ambulate up to 50% weightbearing on her right lower extremity.  We will see her back in 2 weeks with a repeat 2 views of the right knee.  If things look good then we will likely advance her to full weightbearing.  She will still ambulate with an assistive device as well in terms of a walker.

## 2023-01-12 ENCOUNTER — Other Ambulatory Visit: Payer: Self-pay | Admitting: *Deleted

## 2023-01-12 NOTE — Patient Outreach (Signed)
Ms. Monique Gonzalez resides in [facility].  Screening for potential chronic care management services as a benefit of health plan and primary care provider.  Telephonic collaboration meeting with Monique Gonzalez, Child psychotherapist. Ms. Monique Gonzalez is from home with her sisters. 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  No identifiable chronic care management needs.   Raiford Noble, MSN, RN, BSN [place]  [facility], [facility] RN Post- Acute Care Coordinator Direct Dial: [phone]

## 2023-01-13 DIAGNOSIS — S72301D Unspecified fracture of shaft of right femur, subsequent encounter for closed fracture with routine healing: Secondary | ICD-10-CM | POA: Diagnosis not present

## 2023-01-13 DIAGNOSIS — K5903 Drug induced constipation: Secondary | ICD-10-CM | POA: Diagnosis not present

## 2023-01-13 DIAGNOSIS — R600 Localized edema: Secondary | ICD-10-CM | POA: Diagnosis not present

## 2023-01-13 DIAGNOSIS — F3342 Major depressive disorder, recurrent, in full remission: Secondary | ICD-10-CM | POA: Diagnosis not present

## 2023-01-15 ENCOUNTER — Ambulatory Visit (HOSPITAL_BASED_OUTPATIENT_CLINIC_OR_DEPARTMENT_OTHER): Payer: Medicare Other

## 2023-01-15 DIAGNOSIS — R0602 Shortness of breath: Secondary | ICD-10-CM | POA: Diagnosis not present

## 2023-01-15 DIAGNOSIS — F3342 Major depressive disorder, recurrent, in full remission: Secondary | ICD-10-CM

## 2023-01-15 DIAGNOSIS — K5903 Drug induced constipation: Secondary | ICD-10-CM | POA: Diagnosis not present

## 2023-01-15 DIAGNOSIS — R072 Precordial pain: Secondary | ICD-10-CM | POA: Diagnosis not present

## 2023-01-15 DIAGNOSIS — R600 Localized edema: Secondary | ICD-10-CM | POA: Diagnosis not present

## 2023-01-22 ENCOUNTER — Ambulatory Visit (INDEPENDENT_AMBULATORY_CARE_PROVIDER_SITE_OTHER): Payer: Medicare Other

## 2023-01-22 ENCOUNTER — Ambulatory Visit: Payer: Medicare Other | Admitting: Physician Assistant

## 2023-01-22 DIAGNOSIS — Z96651 Presence of right artificial knee joint: Secondary | ICD-10-CM | POA: Diagnosis not present

## 2023-01-22 DIAGNOSIS — M1712 Unilateral primary osteoarthritis, left knee: Secondary | ICD-10-CM | POA: Diagnosis not present

## 2023-01-22 MED ORDER — LIDOCAINE HCL 1 % IJ SOLN
3.0000 mL | INTRAMUSCULAR | Status: AC | PRN
Start: 2023-01-22 — End: 2023-01-22
  Administered 2023-01-22: 3 mL

## 2023-01-22 MED ORDER — HYDROCODONE-ACETAMINOPHEN 5-325 MG PO TABS: 1.0000 | ORAL_TABLET | ORAL | 0 refills | Status: DC | PRN

## 2023-01-22 MED ORDER — HYDROCODONE-ACETAMINOPHEN 5-325 MG PO TABS: 1.0000 | ORAL_TABLET | Freq: Four times a day (QID) | ORAL | 0 refills | Status: DC | PRN

## 2023-01-22 MED ORDER — METHYLPREDNISOLONE ACETATE 40 MG/ML IJ SUSP
40.0000 mg | INTRAMUSCULAR | Status: AC | PRN
Start: 2023-01-22 — End: 2023-01-22
  Administered 2023-01-22: 40 mg via INTRA_ARTICULAR

## 2023-01-22 NOTE — Progress Notes (Signed)
Office Visit Note   Patient: Monique Gonzalez           Date of Birth: Jun 09, 1955           MRN: [id number] Visit Date: 01/22/2023              Requested by: Eloisa Northern, MD [address] [place],  [place] [address] PCP: Eloisa Northern, MD   Assessment & Plan: Visit Diagnoses:  1. Primary osteoarthritis of left knee   2. History of total right knee replacement     Plan: Will have her advance to weightbearing as tolerated on the right knee.  She is given knee brace for the left knee as it was giving way on her.  Advised her to go from a walker to a cane and not to just go without any assistive device initially.  She will follow-up with [name] in 1 month for repeat AP and lateral views of the right knee.  Questions were encouraged and answered at length.  We did refill her hydrocodone today.  Follow-Up Instructions: Return in about 4 weeks (around 02/19/2023).   Orders:  Orders Placed This Encounter  Procedures   Large Joint Inj   XR Knee 1-2 Views Right   XR Knee 1-2 Views Left   Meds ordered this encounter  Medications   DISCONTD: HYDROcodone-acetaminophen (NORCO/VICODIN) 5-325 MG tablet    Sig: Take 1 tablet by mouth every 4 (four) hours as needed for moderate pain (pain score 4-6).    Dispense:  10 tablet    Refill:  0   HYDROcodone-acetaminophen (NORCO/VICODIN) 5-325 MG tablet    Sig: Take 1 tablet by mouth every 6 (six) hours as needed for moderate pain (pain score 4-6).    Dispense:  10 tablet    Refill:  0      Procedures: Large Joint Inj: L knee on 01/22/2023 5:23 PM Indications: pain Details: 22 G 1.5 in needle, anterolateral approach  Arthrogram: No  Medications: 3 mL lidocaine 1 %; 40 mg methylPREDNISolone acetate 40 MG/ML Outcome: tolerated well, no immediate complications Procedure, treatment alternatives, risks and benefits explained, specific risks discussed. Consent was given by the patient. Immediately prior to procedure a time out was called to verify the  correct patient, procedure, equipment, support staff and site/side marked as required. Patient was prepped and draped in the usual sterile fashion.       Clinical Data: No additional findings.   Subjective: Chief Complaint  Patient presents with   Right Knee - Routine Post Op   Left Knee - Pain    HPI Monique Gonzalez returns today follow-up of her right knee.  Again she sustained a nondisplaced medial epicondylar fracture of the right femur.  This is around the total knee arthroplasty.  She has been 50% weightbearing.  She reports that pain is worse at night and achy.  However she does state that her left knee is giving her more pain.  She has known osteoarthritis of the left knee.  She is asking what can be done as that is giving way.  She is using a walker to ambulate.  Review of Systems  Constitutional:  Negative for chills and fever.     Objective: Vital Signs: There were no vitals taken for this visit.  Physical Exam Constitutional:      Appearance: She is not ill-appearing or diaphoretic.  Pulmonary:     Effort: Pulmonary effort is normal.  Neurological:     Mental  Status: She is alert and oriented to person, place, and time.     Ortho Exam Right knee full range of motion.  No abnormal warmth erythema about the knee.  Left knee good range of motion tenderness along medial joint line. Specialty Comments:  No specialty comments available.  Imaging: XR Knee 1-2 Views Left  Result Date: 01/22/2023 Left knee 2 views: Shows bone-on-bone medial compartment.  Patellofemoral compartment with moderate to moderately severe arthritic changes.  Lateral compartment overall well-preserved.  No acute fractures or acute findings.  XR Knee 1-2 Views Right  Result Date: 01/22/2023 Right knee 2 views: No acute change.  Fracture site medial femoral epicondyle shows good consolidation.  No evidence of hardware failure.  Knee is well located.    PMFS History: Patient Active  Problem List   Diagnosis Date Noted   Closed right femoral fracture (HCC) 12/22/2022   Periprosthetic fracture around internal prosthetic right knee joint 12/22/2022   Near syncope 12/08/2022   Allergy    Anxiety    Arthritis    Chronic pain    GERD (gastroesophageal reflux disease)    Obesity    Osteopenia    Seasonal allergies    Lumbar radiculopathy 09/26/2022   Spondylolisthesis of lumbar region 07/22/2022   Status post lumbar spinal fusion 07/22/2022   Ischemic stricture intestine (HCC)    Colitis 02/19/2020   Closed fracture of left distal radius 11/10/2019   Fall 11/10/2019   Closed right ankle fracture 11/09/2019   Ankle fracture 11/09/2019   Closed displaced fracture of styloid process of left ulna    Anxiety and depression 12/22/2016   Insulin resistance 10/15/2016   Presence of right artificial knee joint 10/14/2016   Vitamin D deficiency 04/08/2016   Constipation 09/20/2013   Chronic idiopathic constipation 07/05/2013   Recurrent cold sores    Osteoarthritis    Depression    Hyperlipidemia    Past Medical History:  Diagnosis Date   Anxiety    Phreesia 06/04/2020   Arthritis    Phreesia 07/08/2020   Chronic idiopathic constipation 07/05/2013   Chronic pain    back and knee   Colitis 09/2012   infectious vs inflammatory.    Depression    GERD (gastroesophageal reflux disease)    Phreesia 06/04/2020   Hyperlipidemia    Insulin resistance 10/15/2016   Ischemic stricture intestine (HCC)    Lumbar radiculopathy 09/26/2022   Obesity    Osteoarthritis    Osteopenia    Spondylolisthesis of lumbar region 07/22/2022   Vitamin D deficiency 04/08/2016    Family History  Problem Relation Age of Onset   Stroke Mother    Arthritis Mother    Thyroid disease Mother    Cancer Father    Arthritis Sister    Thyroid disease Sister    Cancer Maternal Grandmother    Stroke Maternal Grandfather    Cancer Maternal Grandfather    Colon cancer Maternal Grandfather     Cancer Paternal Grandmother    Heart disease Paternal Grandmother    Colon cancer Paternal Grandmother    Stroke Paternal Grandfather    Arthritis Sister    Obesity Sister    Sudden death Neg Hx    Hypertension Neg Hx    Hyperlipidemia Neg Hx    Heart attack Neg Hx    Diabetes Neg Hx    Rectal cancer Neg Hx    Stomach cancer Neg Hx    Esophageal cancer Neg Hx     Past Surgical  History:  Procedure Laterality Date   BIOPSY  02/21/2020   Procedure: BIOPSY;  Surgeon: Iva Boop, MD;  Location: [facility] ENDOSCOPY;  Service: Endoscopy;;   COLON RESECTION N/A 10/14/2013   Procedure: LAPAROSCOPIC MOBILIZATION OF SPLENIC FLEXURE, LAPAROSCOPIC EXTENDED LEFT COLECTOMY;  Surgeon: Atilano Ina, MD;  Location: [facility] OR;  Service: General;  Laterality: N/A;   COLON SURGERY N/A    Phreesia 06/04/2020   COLONOSCOPY N/A 10/12/2013   Procedure: COLONOSCOPY;  Surgeon: Beverley Fiedler, MD;  Location: [facility] ENDOSCOPY;  Service: Endoscopy;  Laterality: N/A;   COLONOSCOPY WITH PROPOFOL N/A 02/21/2020   Procedure: COLONOSCOPY WITH PROPOFOL;  Surgeon: Iva Boop, MD;  Location: [facility] ENDOSCOPY;  Service: Endoscopy;  Laterality: N/A;   DILATION AND CURETTAGE, DIAGNOSTIC / THERAPEUTIC  1987   OPEN REDUCTION INTERNAL FIXATION (ORIF) DISTAL RADIAL FRACTURE Right 08/08/2014   Procedure: OPEN REDUCTION INTERNAL FIXATION (ORIF) DISTAL RADIAL FRACTURE;  Surgeon: Dominica Severin, MD;  Location: [facility] OR;  Service: Orthopedics;  Laterality: Right;  DVR Crosslock, MD available sometime around 1430-1500   ORIF ANKLE FRACTURE Right 11/10/2019   Procedure: POSSIBLEOPEN REDUCTION INTERNAL FIXATION (ORIF) ANKLE FRACTURE;  Surgeon: Roby Lofts, MD;  Location: [facility] OR;  Service: Orthopedics;  Laterality: Right;   ORIF WRIST FRACTURE Left 11/10/2019   Procedure: OPEN REDUCTION INTERNAL FIXATION (ORIF) WRIST FRACTURE;  Surgeon: Roby Lofts, MD;  Location: [facility] OR;  Service: Orthopedics;  Laterality: Left;   TONSILLECTOMY      TOTAL KNEE ARTHROPLASTY Right 08/05/2016   Procedure: RIGHT TOTAL KNEE ARTHROPLASTY;  Surgeon: Kathryne Hitch, MD;  Location: [facility] OR;  Service: Orthopedics;  Laterality: Right;   Social History   Occupational History   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

## 2023-02-13 ENCOUNTER — Ambulatory Visit (INDEPENDENT_AMBULATORY_CARE_PROVIDER_SITE_OTHER): Payer: Medicare Other | Admitting: Family Medicine

## 2023-02-13 ENCOUNTER — Encounter: Payer: Self-pay | Admitting: Family Medicine

## 2023-02-13 VITALS — BP 160/90 | HR 92 | Temp 98.4°F | Ht 70.0 in | Wt 292.1 lb

## 2023-02-13 DIAGNOSIS — R739 Hyperglycemia, unspecified: Secondary | ICD-10-CM | POA: Diagnosis not present

## 2023-02-13 NOTE — Progress Notes (Signed)
Subjective:    [name], female    DOB: [birth year], 67 y.o.   MRN: [id number]  Patient is a very pleasant 67 year old Caucasian female.  She has had a tough year.  She underwent back surgery in May.  She has had a right knee replacement.  Recently in October, the patient fell.  She lost her balance when she tripped on her patio.  She suffered a periprosthetic fracture in the right femur.  She went to the hospital orthopedics recommended a knee immobilizer.  She was in a nursing home and rehab facility thereafter.  Hospital course was complicated by an episode of syncope that was assumed to be vasovagal.  It occurred while the patient was defecating.  This has happened before for the patient.  She has not had any further episodes of syncope since leaving the hospital.  Her blood pressure today is extremely high however the patient was having to walk into the clinic using a cane.  I imagine this is a great deal of strain for her given the situation.  She states that she has been checking her blood pressure at home and it is typically been around 140/80.  Patient is an LPN.  I recommended that she start checking her blood pressure frequently at home so we can get an accurate representation of what her true blood pressure is running.  Otherwise she is doing well.  She denies any chest pain or shortness of breath or dysuria.  There is no evidence of the DVT on exam today. Past Medical History:  Diagnosis Date   Anxiety    Phreesia 06/04/2020   Arthritis    Phreesia 07/08/2020   Chronic idiopathic constipation 07/05/2013   Chronic pain    back and knee   Colitis 09/2012   infectious vs inflammatory.    Depression    GERD (gastroesophageal reflux disease)    Phreesia 06/04/2020   Hyperlipidemia    Insulin resistance 10/15/2016   Ischemic stricture intestine (HCC)    Lumbar radiculopathy 09/26/2022   Obesity    Osteoarthritis    Osteopenia    Spondylolisthesis of lumbar region  07/22/2022   Vitamin D deficiency 04/08/2016   Past Surgical History:  Procedure Laterality Date   BIOPSY  02/21/2020   Procedure: BIOPSY;  Surgeon: Iva Boop, MD;  Location: [facility] ENDOSCOPY;  Service: Endoscopy;;   COLON RESECTION N/A 10/14/2013   Procedure: LAPAROSCOPIC MOBILIZATION OF SPLENIC FLEXURE, LAPAROSCOPIC EXTENDED LEFT COLECTOMY;  Surgeon: Atilano Ina, MD;  Location: [facility] OR;  Service: General;  Laterality: N/A;   COLON SURGERY N/A    Phreesia 06/04/2020   COLONOSCOPY N/A 10/12/2013   Procedure: COLONOSCOPY;  Surgeon: Beverley Fiedler, MD;  Location: [facility] ENDOSCOPY;  Service: Endoscopy;  Laterality: N/A;   COLONOSCOPY WITH PROPOFOL N/A 02/21/2020   Procedure: COLONOSCOPY WITH PROPOFOL;  Surgeon: Iva Boop, MD;  Location: [facility] ENDOSCOPY;  Service: Endoscopy;  Laterality: N/A;   DILATION AND CURETTAGE, DIAGNOSTIC / THERAPEUTIC  1987   OPEN REDUCTION INTERNAL FIXATION (ORIF) DISTAL RADIAL FRACTURE Right 08/08/2014   Procedure: OPEN REDUCTION INTERNAL FIXATION (ORIF) DISTAL RADIAL FRACTURE;  Surgeon: Dominica Severin, MD;  Location: [facility] OR;  Service: Orthopedics;  Laterality: Right;  DVR Crosslock, MD available sometime around 1430-1500   ORIF ANKLE FRACTURE Right 11/10/2019   Procedure: POSSIBLEOPEN REDUCTION INTERNAL FIXATION (ORIF) ANKLE FRACTURE;  Surgeon: Roby Lofts, MD;  Location: [facility] OR;  Service: Orthopedics;  Laterality: Right;   ORIF WRIST FRACTURE  Left 11/10/2019   Procedure: OPEN REDUCTION INTERNAL FIXATION (ORIF) WRIST FRACTURE;  Surgeon: Roby Lofts, MD;  Location: [facility] OR;  Service: Orthopedics;  Laterality: Left;   TONSILLECTOMY     TOTAL KNEE ARTHROPLASTY Right 08/05/2016   Procedure: RIGHT TOTAL KNEE ARTHROPLASTY;  Surgeon: Kathryne Hitch, MD;  Location: [facility] OR;  Service: Orthopedics;  Laterality: Right;   Current Outpatient Medications on File Prior to Visit  Medication Sig Dispense Refill   acetaminophen (TYLENOL) 650 MG CR tablet Take 1,300 mg by  mouth at bedtime.     atorvastatin (LIPITOR) 40 MG tablet TAKE 1 TABLET EVERY DAY (Patient taking differently: Take 40 mg by mouth daily.) 90 tablet 3   b complex vitamins capsule Take 1 capsule by mouth daily.     Calcium Carb-Cholecalciferol (CALCIUM 600+D3 PO) Take 1 tablet by mouth in the morning and at bedtime.     Cholecalciferol (VITAMIN D-3) 25 MCG (1000 UT) CAPS Take 1,000 Units by mouth daily with breakfast.     cyclobenzaprine (FLEXERIL) 10 MG tablet Take 1 tablet (10 mg total) by mouth 3 (three) times daily as needed for muscle spasms. 30 tablet 3   docusate sodium (COLACE) 100 MG capsule Take 1 capsule (100 mg total) by mouth 2 (two) times daily. 60 capsule 0   fluticasone (FLONASE) 50 MCG/ACT nasal spray Place 2 sprays into both nostrils at bedtime.     HYDROcodone-acetaminophen (NORCO/VICODIN) 5-325 MG tablet Take 1 tablet by mouth every 6 (six) hours as needed for moderate pain (pain score 4-6). 10 tablet 0   linaclotide (LINZESS) 290 MCG CAPS capsule Take 1 capsule (290 mcg total) by mouth daily 30 minutes before breakfast 30 capsule 3   loratadine (CLARITIN) 10 MG tablet Take 10 mg by mouth at bedtime.     Melatonin 10 MG TABS Take 10 mg by mouth at bedtime.     meloxicam (MOBIC) 15 MG tablet Take 1 tablet (15 mg total) by mouth daily. 90 tablet 3   Multiple Vitamins-Minerals (ONE-A-DAY WOMENS PO) Take 1 tablet by mouth daily with breakfast.     Omega-3 Fatty Acids (FISH OIL) 1000 MG CAPS Take 1,000 mg by mouth 2 (two) times daily.     pantoprazole (PROTONIX) 40 MG tablet TAKE 1 TABLET EVERY DAY (Patient taking differently: Take 40 mg by mouth daily.) 90 tablet 3   polyethylene glycol (MIRALAX / GLYCOLAX) 17 g packet Take 17 g by mouth daily.     venlafaxine XR (EFFEXOR-XR) 75 MG 24 hr capsule Take 1 capsule (75 mg total) by mouth daily with breakfast.     Wheat Dextrin (BENEFIBER) POWD Take 1 Dose by mouth daily. 2 teaspoons     No current facility-administered medications on  file prior to visit.   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    Employer: [employer]    Comment: LPN  Tobacco Use   Smoking status: Former    Current packs/day: 0.00    Types: Cigarettes    Quit date: 10/27/1993    Years since quitting: 29.3   Smokeless tobacco:  Never  Vaping Use   Vaping status: Never Used  Substance and Sexual Activity   Alcohol use: Yes    Comment: one drink per week   Drug use: No   Sexual activity: Not Currently  Other Topics Concern   Not on file  Social History Narrative   Not on file   Social Determinants of Health   Financial Resource Strain: Medium Risk (02/09/2023)   Overall Financial Resource Strain (CARDIA)    Difficulty of Paying Living Expenses: Somewhat hard  Food Insecurity: No Food Insecurity (02/09/2023)   Hunger Vital Sign    Worried About Running Out of Food in the Last Year: Never true    Ran Out of Food in the Last Year: Never true  Transportation Needs: No Transportation Needs (02/09/2023)   PRAPARE - Administrator, Civil Service (Medical): No    Lack of Transportation (Non-Medical): No  Physical Activity: Insufficiently Active (02/09/2023)   Exercise Vital Sign    Days of Exercise per Week: 1 day    Minutes of Exercise per Session: 30 min  Stress: Stress Concern Present (02/09/2023)   [employer] of Occupational Health - Occupational Stress Questionnaire    Feeling of Stress : Rather much  Social Connections: Socially Isolated (02/09/2023)   Social Connection and Isolation Panel [NHANES]    Frequency of  Communication with Friends and Family: More than three times a week    Frequency of Social Gatherings with Friends and Family: Patient declined    Attends Religious Services: Never    Database administrator or Organizations: No    Attends Banker Meetings: Never    Marital Status: Divorced  Catering manager Violence: Not At Risk (12/22/2022)   Humiliation, Afraid, Rape, and Kick questionnaire    Fear of Current or Ex-Partner: No    Emotionally Abused: No    Physically Abused: No    Sexually Abused: No   Family History  Problem Relation Age of Onset   Stroke Mother    Arthritis Mother    Thyroid disease Mother    Cancer Father    Arthritis Sister    Thyroid disease Sister    Cancer Maternal Grandmother    Stroke Maternal Grandfather    Cancer Maternal Grandfather    Colon cancer Maternal Grandfather    Cancer Paternal Grandmother    Heart disease Paternal Grandmother    Colon cancer Paternal Grandmother    Stroke Paternal Grandfather    Arthritis Sister    Obesity Sister    Sudden death Neg Hx    Hypertension Neg Hx    Hyperlipidemia Neg Hx    Heart attack Neg Hx    Diabetes Neg Hx    Rectal cancer Neg Hx    Stomach cancer Neg Hx    Esophageal cancer Neg Hx       Review of Systems  Neurological:  Positive for dizziness.  All other systems reviewed and are negative.      Objective:   Physical Exam Vitals reviewed.  Constitutional:      General: She is not in acute distress.    Appearance: She is obese. She is not ill-appearing, toxic-appearing or diaphoretic.  HENT:     Head: Normocephalic and atraumatic.     Right Ear: Tympanic membrane, ear canal and external ear normal. There is no impacted cerumen.     Left Ear: Tympanic membrane, ear canal and external ear normal. There is no impacted cerumen.  Nose: Nose normal. No congestion or rhinorrhea.     Mouth/Throat:     Mouth: Mucous membranes are moist.     Pharynx: Oropharynx is clear. No  oropharyngeal exudate or posterior oropharyngeal erythema.  Eyes:     General: No scleral icterus.       Right eye: No discharge.        Left eye: No discharge.     Extraocular Movements: Extraocular movements intact.     Conjunctiva/sclera: Conjunctivae normal.     Pupils: Pupils are equal, round, and reactive to light.  Neck:     Vascular: No carotid bruit.  Cardiovascular:     Rate and Rhythm: Normal rate and regular rhythm.     Pulses: Normal pulses.     Heart sounds: Normal heart sounds. No murmur heard.    No friction rub. No gallop.  Pulmonary:     Effort: Pulmonary effort is normal. No respiratory distress.     Breath sounds: Normal breath sounds. No stridor. No wheezing, rhonchi or rales.  Chest:     Chest wall: No tenderness.  Abdominal:     General: Bowel sounds are normal. There is no distension.     Palpations: Abdomen is soft. There is no mass.     Tenderness: There is no abdominal tenderness. There is no right CVA tenderness, left CVA tenderness, guarding or rebound.     Hernia: No hernia is present.  Musculoskeletal:     Cervical back: Normal range of motion. No muscular tenderness.     Right lower leg: No edema.     Left lower leg: No edema.  Lymphadenopathy:     Cervical: No cervical adenopathy.  Skin:    General: Skin is warm.     Coloration: Skin is not jaundiced or pale.     Findings: No bruising, erythema, lesion or rash.  Neurological:     General: No focal deficit present.     Mental Status: She is alert and oriented to person, place, and time. Mental status is at baseline.     Cranial Nerves: No cranial nerve deficit.  Psychiatric:        Mood and Affect: Mood normal.        Behavior: Behavior normal.        Thought Content: Thought content normal.        Judgment: Judgment normal.           Assessment & Plan:  Elevated blood sugar - Plan: Hemoglobin A1c, CBC with Differential/Platelet, COMPLETE METABOLIC PANEL WITH GFR In the hospital,  her blood sugars were elevated between 120 and 130.  Therefore on a check hemoglobin A1c to see if the patient is starting to develop prediabetes.  I will also check a CBC to evaluate for any anemia after her injury along with a CMP to monitor her electrolytes.  If her blood counts are stable, I plan to see the patient back in March or April for her physical

## 2023-02-14 LAB — CBC WITH DIFFERENTIAL/PLATELET
Absolute Lymphocytes: 1517 {cells}/uL (ref 850–3900)
Absolute Monocytes: 616 {cells}/uL (ref 200–950)
Basophils Absolute: 40 {cells}/uL (ref 0–200)
Basophils Relative: 0.5 %
Eosinophils Absolute: 40 {cells}/uL (ref 15–500)
Eosinophils Relative: 0.5 %
HCT: 37.9 % (ref 35.0–45.0)
Hemoglobin: 12.5 g/dL (ref 11.7–15.5)
MCH: 29.1 pg (ref 27.0–33.0)
MCHC: 33 g/dL (ref 32.0–36.0)
MCV: 88.3 fL (ref 80.0–100.0)
MPV: 9.9 fL (ref 7.5–12.5)
Monocytes Relative: 7.8 %
Neutro Abs: 5688 {cells}/uL (ref 1500–7800)
Neutrophils Relative %: 72 %
Platelets: 269 10*3/uL (ref 140–400)
RBC: 4.29 10*6/uL (ref 3.80–5.10)
RDW: 13 % (ref 11.0–15.0)
Total Lymphocyte: 19.2 %
WBC: 7.9 10*3/uL (ref 3.8–10.8)

## 2023-02-14 LAB — COMPLETE METABOLIC PANEL WITH GFR
AG Ratio: 1.8 (calc) (ref 1.0–2.5)
ALT: 10 U/L (ref 6–29)
AST: 14 U/L (ref 10–35)
Albumin: 4.2 g/dL (ref 3.6–5.1)
Alkaline phosphatase (APISO): 86 U/L (ref 37–153)
BUN: 21 mg/dL (ref 7–25)
CO2: 29 mmol/L (ref 20–32)
Calcium: 9.4 mg/dL (ref 8.6–10.4)
Chloride: 101 mmol/L (ref 98–110)
Creat: 0.96 mg/dL (ref 0.50–1.05)
Globulin: 2.3 g/dL (ref 1.9–3.7)
Glucose, Bld: 96 mg/dL (ref 65–99)
Potassium: 4.8 mmol/L (ref 3.5–5.3)
Sodium: 138 mmol/L (ref 135–146)
Total Bilirubin: 0.4 mg/dL (ref 0.2–1.2)
Total Protein: 6.5 g/dL (ref 6.1–8.1)
eGFR: 64 mL/min/{1.73_m2} (ref >=60)

## 2023-02-14 LAB — HEMOGLOBIN A1C
Hgb A1c MFr Bld: 5.4 %{Hb} (ref <5.7)
Mean Plasma Glucose: 108 mg/dL
eAG (mmol/L): 6 mmol/L

## 2023-02-16 ENCOUNTER — Encounter: Payer: Self-pay | Admitting: Family Medicine

## 2023-02-23 ENCOUNTER — Encounter: Payer: Self-pay | Admitting: Physician Assistant

## 2023-02-23 ENCOUNTER — Other Ambulatory Visit (INDEPENDENT_AMBULATORY_CARE_PROVIDER_SITE_OTHER): Payer: Medicare Other

## 2023-02-23 ENCOUNTER — Ambulatory Visit (INDEPENDENT_AMBULATORY_CARE_PROVIDER_SITE_OTHER): Payer: Medicare Other | Admitting: Physician Assistant

## 2023-02-23 DIAGNOSIS — Z96651 Presence of right artificial knee joint: Secondary | ICD-10-CM

## 2023-02-23 DIAGNOSIS — M9711XD Periprosthetic fracture around internal prosthetic right knee joint, subsequent encounter: Secondary | ICD-10-CM

## 2023-02-23 NOTE — Progress Notes (Signed)
HPI: Monique Gonzalez comes in today follow-up of her right knee.  She states her knee is doing well then her pain is decreasing medial aspect of the knee.  Still some achiness and pain with steps.  She is using a cane to ambulate.  She sustained a nondisplaced medial epicondylar fracture right femur around the total knee arthroplasty. Left knee is better with the use of hinged knee brace.  She feels that the cortisone injection only gave her about 2 weeks of relief.  She has known osteoarthritis of the left knee.  No new injuries.  Review of systems: See HPI otherwise negative noncontributory.  Physical exam: General Well-developed well-nourished female who walks with an antalgic gait and the use of a cane. Right knee good range of motion.  Tenderness over the medial femoral condyle region.  No abnormal warmth erythema or effusion.  Radiographs: Right knee 2 views: Status post right total knee arthroplasty with well-seated components.  The medial epicondylar fracture remains nondisplaced.  There is significant calcification at the fracture site.  No other acute injuries.  Impression: Right knee epicondylar fracture Left knee osteoarthritis  Plan: At this point in regards to her right knee she is activities as tolerated.  Recommend work on Dance movement psychotherapist.  Knee friendly exercises discussed.  Guards to her left knee at this point in time she is not interested in any type of surgical intervention she would like to lose weight first.  She will follow-up with [name] as needed.  She knows to wait at least 3 months between cortisone injections in the left knee.

## 2023-03-24 ENCOUNTER — Telehealth: Payer: Self-pay | Admitting: *Deleted

## 2023-03-24 NOTE — Telephone Encounter (Signed)
 Faxed received from [employer] regarding LINZESS. Approval for  patient assistance is on file from 1.12.25 to 12.31.25.    Phone number: [phone]

## 2023-04-07 ENCOUNTER — Encounter: Payer: Self-pay | Admitting: Family Medicine

## 2023-04-07 MED ORDER — VENLAFAXINE HCL ER 150 MG PO CP24: ORAL_CAPSULE | ORAL | 3 refills | Status: DC

## 2023-05-18 ENCOUNTER — Emergency Department (HOSPITAL_BASED_OUTPATIENT_CLINIC_OR_DEPARTMENT_OTHER)
Admission: EM | Admit: 2023-05-18 | Discharge: 2023-05-18 | Disposition: A | Discharge status: Home / Self Care | Attending: Emergency Medicine | Admitting: Emergency Medicine

## 2023-05-18 ENCOUNTER — Other Ambulatory Visit: Payer: Self-pay

## 2023-05-18 ENCOUNTER — Encounter (HOSPITAL_BASED_OUTPATIENT_CLINIC_OR_DEPARTMENT_OTHER): Payer: Self-pay

## 2023-05-18 DIAGNOSIS — R55 Syncope and collapse: Secondary | ICD-10-CM | POA: Insufficient documentation

## 2023-05-18 LAB — BASIC METABOLIC PANEL WITH GFR
Anion gap: 8 (ref 5–15)
BUN: 22 mg/dL (ref 8–23)
CO2: 27 mmol/L (ref 22–32)
Calcium: 9.8 mg/dL (ref 8.9–10.3)
Chloride: 103 mmol/L (ref 98–111)
Creatinine, Ser: 1.27 mg/dL — ABNORMAL HIGH (ref 0.44–1.00)
GFR, Estimated: 46 mL/min — ABNORMAL LOW
Glucose, Bld: 94 mg/dL (ref 70–99)
Potassium: 4.1 mmol/L (ref 3.5–5.1)
Sodium: 138 mmol/L (ref 135–145)

## 2023-05-18 LAB — CBC WITH DIFFERENTIAL/PLATELET
Abs Immature Granulocytes: 0.03 10*3/uL (ref 0.00–0.07)
Basophils Absolute: 0 10*3/uL (ref 0.0–0.1)
Basophils Relative: 1 %
Eosinophils Absolute: 0 10*3/uL (ref 0.0–0.5)
Eosinophils Relative: 1 %
HCT: 36.5 % (ref 36.0–46.0)
Hemoglobin: 12.3 g/dL (ref 12.0–15.0)
Immature Granulocytes: 1 %
Lymphocytes Relative: 18 %
Lymphs Abs: 1.1 10*3/uL (ref 0.7–4.0)
MCH: 29.5 pg (ref 26.0–34.0)
MCHC: 33.7 g/dL (ref 30.0–36.0)
MCV: 87.5 fL (ref 80.0–100.0)
Monocytes Absolute: 0.6 10*3/uL (ref 0.1–1.0)
Monocytes Relative: 10 %
Neutro Abs: 4.3 10*3/uL (ref 1.7–7.7)
Neutrophils Relative %: 69 %
Platelets: 225 10*3/uL (ref 150–400)
RBC: 4.17 MIL/uL (ref 3.87–5.11)
RDW: 14.1 % (ref 11.5–15.5)
WBC: 6 10*3/uL (ref 4.0–10.5)
nRBC: 0 % (ref 0.0–0.2)

## 2023-05-18 LAB — MAGNESIUM: Magnesium: 2.3 mg/dL (ref 1.7–2.4)

## 2023-05-18 MED ORDER — SODIUM CHLORIDE 0.9 % IV BOLUS
1000.0000 mL | Freq: Once | INTRAVENOUS | Status: AC
  Administered 2023-05-18: 1000 mL via INTRAVENOUS

## 2023-05-18 NOTE — Discharge Instructions (Signed)
 Eat and drink as well as you can.  Please follow-up with your cardiologist and family doctor in the office.

## 2023-05-18 NOTE — ED Triage Notes (Signed)
 Was at home making breakfast prior to aerobics class and felt like she was going to pass out. Laid down then had syncopal episode, no fall. States hx of same and has been seen for it multiple times with no answers on cause. Last episode was in October.

## 2023-05-18 NOTE — ED Provider Notes (Signed)
 [facility] Provider Note   CSN: [id number] Arrival date & time: 05/18/23  1019     History  Chief Complaint  Patient presents with   Loss of Consciousness    Monique Gonzalez is a 68 y.o. female.  68 yo F with a chief complaints of a syncopal events or perhaps a near syncopal event.  She has had multiple events over years where she ends up feeling unwell and then passes out or almost passes out.  She has had a couple events in the past 6 months and was hospitalized for one of them.  She has had a recent Holter monitor as well as a CT angiogram of the chest.  She had seen the cardiologist and was thought to be vasovagal in etiology.  She had another event today she was making breakfast and then felt unwell thought maybe she had to move her bowels and when she went to the toilet to bear down she felt worse and had to lay on the floor.  She thought it lasted longer than it typically does.  She feels better now but feels a bit fatigued.  Had what sounds like an upper respiratory illness over the past week but is gotten better.  Feels like she has been eating and drinking normally.  No new medication changes.   Loss of Consciousness      Home Medications Prior to Admission medications   Medication Sig Start Date End Date Taking? Authorizing Provider  acetaminophen (TYLENOL) 650 MG CR tablet Take 1,300 mg by mouth at bedtime.    [provider]  atorvastatin (LIPITOR) 40 MG tablet TAKE 1 TABLET EVERY DAY Patient taking differently: Take 40 mg by mouth daily. 07/21/22   Donita Brooks, MD  b complex vitamins capsule Take 1 capsule by mouth daily.    [provider]  Calcium Carb-Cholecalciferol (CALCIUM 600+D3 PO) Take 1 tablet by mouth in the morning and at bedtime.    [provider]  Cholecalciferol (VITAMIN D-3) 25 MCG (1000 UT) CAPS Take 1,000 Units by mouth daily with breakfast.    [provider]   cyclobenzaprine (FLEXERIL) 10 MG tablet Take 1 tablet (10 mg total) by mouth 3 (three) times daily as needed for muscle spasms. 07/23/22   Barnett Abu, MD  docusate sodium (COLACE) 100 MG capsule Take 1 capsule (100 mg total) by mouth 2 (two) times daily. 02/22/20   Joseph Art, DO  fluticasone (FLONASE) 50 MCG/ACT nasal spray Place 2 sprays into both nostrils at bedtime.    [provider]  HYDROcodone-acetaminophen (NORCO/VICODIN) 5-325 MG tablet Take 1 tablet by mouth every 6 (six) hours as needed for moderate pain (pain score 4-6). Patient not taking: Reported on 02/13/2023 01/22/23   Kirtland Bouchard, PA-C  linaclotide [facility]) 290 MCG CAPS capsule Take 1 capsule (290 mcg total) by mouth daily 30 minutes before breakfast 01/08/22   Unk Lightning, PA  loratadine (CLARITIN) 10 MG tablet Take 10 mg by mouth at bedtime.    [provider]  Melatonin 10 MG TABS Take 10 mg by mouth at bedtime.    [provider]  meloxicam (MOBIC) 15 MG tablet Take 1 tablet (15 mg total) by mouth daily. 09/26/22   Donita Brooks, MD  Multiple Vitamins-Minerals (ONE-A-DAY WOMENS PO) Take 1 tablet by mouth daily with breakfast.    [provider]  Omega-3 Fatty Acids (FISH OIL) 1000 MG CAPS Take 1,000  mg by mouth 2 (two) times daily.    [provider]  pantoprazole (PROTONIX) 40 MG tablet TAKE 1 TABLET EVERY DAY Patient taking differently: Take 40 mg by mouth daily. 07/21/22   Donita Brooks, MD  polyethylene glycol (MIRALAX / GLYCOLAX) 17 g packet Take 17 g by mouth daily.    [provider]  venlafaxine XR (EFFEXOR-XR) 150 MG 24 hr capsule Take 150 mg by mouth daily with breakfast. 04/07/23   Donita Brooks, MD  Wheat Dextrin (BENEFIBER) POWD Take 1 Dose by mouth daily. 2 teaspoons    [provider]      Allergies    Ciprofloxacin, Penicillins, and Sulfa antibiotics    Review of Systems   Review of Systems  Cardiovascular:   Positive for syncope.    Physical Exam Updated Vital Signs BP 116/60 (BP Location: Right Arm)   Pulse 65   Temp (!) 97.4 F (36.3 C) (Oral)   Resp 16   Ht 5\' 10"  (1.778 m)   Wt 127.5 kg   SpO2 100%   BMI 40.32 kg/m  Physical Exam Vitals and nursing note reviewed.  Constitutional:      General: She is not in acute distress.    Appearance: She is well-developed. She is not diaphoretic.  HENT:     Head: Normocephalic and atraumatic.  Eyes:     Pupils: Pupils are equal, round, and reactive to light.  Cardiovascular:     Rate and Rhythm: Normal rate and regular rhythm.     Heart sounds: No murmur heard.    No friction rub. No gallop.  Pulmonary:     Effort: Pulmonary effort is normal.     Breath sounds: No wheezing or rales.  Abdominal:     General: There is no distension.     Palpations: Abdomen is soft.     Tenderness: There is no abdominal tenderness.  Musculoskeletal:        General: No tenderness.     Cervical back: Normal range of motion and neck supple.  Skin:    General: Skin is warm and dry.  Neurological:     Mental Status: She is alert and oriented to person, place, and time.  Psychiatric:        Behavior: Behavior normal.     ED Results / Procedures / Treatments   Labs (all labs ordered are listed, but only abnormal results are displayed) Labs Reviewed  BASIC METABOLIC PANEL - Abnormal; Notable for the following components:      Result Value   Creatinine, Ser 1.27 (*)    GFR, Estimated 46 (*)    All other components within normal limits  CBC WITH DIFFERENTIAL/PLATELET  MAGNESIUM    EKG EKG Interpretation Date/Time:  Monday May 18 2023 10:28:39 EDT Ventricular Rate:  66 PR Interval:  188 QRS Duration:  114 QT Interval:  451 QTC Calculation: 473 R Axis:   -1  Text Interpretation: Pacemaker spikes or artifacts Sinus rhythm Incomplete left bundle branch block No significant change since last tracing Confirmed by Melene Plan [phone]) on  05/18/2023 11:12:44 AM  Radiology No results found.  Procedures Procedures    Medications Ordered in ED Medications  sodium chloride 0.9 % bolus 1,000 mL (1,000 mLs Intravenous New Bag/Given 05/18/23 1049)    ED Course/ Medical Decision Making/ A&P  Medical Decision Making Amount and/or Complexity of Data Reviewed Labs: ordered.   68 yo F with a chief complaints of what sounds like a near syncopal event.  Patient has had these off and on for years now.  Thought to be vasovagal.  Has been evaluated by cardiology and has a recent echo as well as a Holter monitor and has had a CT angiogram of the chest.  I think likely she had recurrent vasovagal event.  Will give a bolus of IV fluids check blood work.  No acute anemia no significant electrolyte abnormalities.  Patient feeling a bit better.  Will discharge home post fluids.  Cardiology and PCP follow-up.   11:20 AM:  I have discussed the diagnosis/risks/treatment options with the patient.  Evaluation and diagnostic testing in the emergency department does not suggest an emergent condition requiring admission or immediate intervention beyond what has been performed at this time.  They will follow up with PCP. We also discussed returning to the ED immediately if new or worsening sx occur. We discussed the sx which are most concerning (e.g., sudden worsening pain, fever, inability to tolerate by mouth, recurrent event, chest pain) that necessitate immediate return. Medications administered to the patient during their visit and any new prescriptions provided to the patient are listed below.  Medications given during this visit Medications  sodium chloride 0.9 % bolus 1,000 mL (1,000 mLs Intravenous New Bag/Given 05/18/23 1049)     The patient appears reasonably screen and/or stabilized for discharge and I doubt any other medical condition or other [facility] requiring further screening, evaluation, or treatment in  the ED at this time prior to discharge.           Final Clinical Impression(s) / ED Diagnoses Final diagnoses:  Near syncope    Rx / DC Orders ED Discharge Orders          Ordered    Ambulatory referral to Cardiology       Comments: If you have not heard from the Cardiology office within the next 72 hours please call [phone].   05/18/23 1118              Melene Plan, DO 05/18/23 1120

## 2023-06-04 ENCOUNTER — Ambulatory Visit: Admitting: Family Medicine

## 2023-06-04 ENCOUNTER — Encounter: Payer: Self-pay | Admitting: Family Medicine

## 2023-06-04 VITALS — BP 138/76 | HR 108 | Temp 98.4°F | Ht 70.0 in | Wt 281.0 lb

## 2023-06-04 DIAGNOSIS — I10 Essential (primary) hypertension: Secondary | ICD-10-CM

## 2023-06-04 MED ORDER — NEBIVOLOL HCL 2.5 MG PO TABS: 2.5000 mg | ORAL_TABLET | Freq: Every day | ORAL | 3 refills | Status: DC

## 2023-06-04 NOTE — Progress Notes (Signed)
 Subjective:    [name], female    DOB: 04-Apr-1955, 68 y.o.   MRN: [id number]  Patient is a very pleasant 68 year old Caucasian female whose blood pressure has been averaging 150/90.  She has been monitoring it consistently.  Past medical history is complicated by frequent episodes of vasovagal syncope and near syncope. Past Medical History:  Diagnosis Date   Anxiety    Phreesia 06/04/2020   Arthritis    Phreesia 07/08/2020   Chronic idiopathic constipation 07/05/2013   Chronic pain    back and knee   Colitis 09/2012   infectious vs inflammatory.    Depression    GERD (gastroesophageal reflux disease)    Phreesia 06/04/2020   Hyperlipidemia    Insulin resistance 10/15/2016   Ischemic stricture intestine (HCC)    Lumbar radiculopathy 09/26/2022   Obesity    Osteoarthritis    Osteopenia    Spondylolisthesis of lumbar region 07/22/2022   Vitamin D deficiency 04/08/2016   Past Surgical History:  Procedure Laterality Date   BIOPSY  02/21/2020   Procedure: BIOPSY;  Surgeon: Iva Boop, MD;  Location: [facility] ENDOSCOPY;  Service: Endoscopy;;   COLON RESECTION N/A 10/14/2013   Procedure: LAPAROSCOPIC MOBILIZATION OF SPLENIC FLEXURE, LAPAROSCOPIC EXTENDED LEFT COLECTOMY;  Surgeon: Atilano Ina, MD;  Location: [facility] OR;  Service: General;  Laterality: N/A;   COLON SURGERY N/A    Phreesia 06/04/2020   COLONOSCOPY N/A 10/12/2013   Procedure: COLONOSCOPY;  Surgeon: Beverley Fiedler, MD;  Location: [facility] ENDOSCOPY;  Service: Endoscopy;  Laterality: N/A;   COLONOSCOPY WITH PROPOFOL N/A 02/21/2020   Procedure: COLONOSCOPY WITH PROPOFOL;  Surgeon: Iva Boop, MD;  Location: [facility] ENDOSCOPY;  Service: Endoscopy;  Laterality: N/A;   DILATION AND CURETTAGE, DIAGNOSTIC / THERAPEUTIC  1987   OPEN REDUCTION INTERNAL FIXATION (ORIF) DISTAL RADIAL FRACTURE Right 08/08/2014   Procedure: OPEN REDUCTION INTERNAL FIXATION (ORIF) DISTAL RADIAL FRACTURE;  Surgeon: Dominica Severin, MD;   Location: [facility] OR;  Service: Orthopedics;  Laterality: Right;  DVR Crosslock, MD available sometime around 1430-1500   ORIF ANKLE FRACTURE Right 11/10/2019   Procedure: POSSIBLEOPEN REDUCTION INTERNAL FIXATION (ORIF) ANKLE FRACTURE;  Surgeon: Roby Lofts, MD;  Location: [facility] OR;  Service: Orthopedics;  Laterality: Right;   ORIF WRIST FRACTURE Left 11/10/2019   Procedure: OPEN REDUCTION INTERNAL FIXATION (ORIF) WRIST FRACTURE;  Surgeon: Roby Lofts, MD;  Location: [facility] OR;  Service: Orthopedics;  Laterality: Left;   TONSILLECTOMY     TOTAL KNEE ARTHROPLASTY Right 08/05/2016   Procedure: RIGHT TOTAL KNEE ARTHROPLASTY;  Surgeon: Kathryne Hitch, MD;  Location: [facility] OR;  Service: Orthopedics;  Laterality: Right;   Current Outpatient Medications on File Prior to Visit  Medication Sig Dispense Refill   acetaminophen (TYLENOL) 650 MG CR tablet Take 1,300 mg by mouth at bedtime.     atorvastatin (LIPITOR) 40 MG tablet TAKE 1 TABLET EVERY DAY (Patient taking differently: Take 40 mg by mouth daily.) 90 tablet 3   b complex vitamins capsule Take 1 capsule by mouth daily.     Calcium Carb-Cholecalciferol (CALCIUM 600+D3 PO) Take 1 tablet by mouth in the morning and at bedtime.     Cholecalciferol (VITAMIN D-3) 25 MCG (1000 UT) CAPS Take 1,000 Units by mouth daily with breakfast.     cyclobenzaprine (FLEXERIL) 10 MG tablet Take 1 tablet (10 mg total) by mouth 3 (three) times daily as needed for muscle spasms. 30 tablet 3   docusate sodium (COLACE) 100 MG  capsule Take 1 capsule (100 mg total) by mouth 2 (two) times daily. 60 capsule 0   fluticasone (FLONASE) 50 MCG/ACT nasal spray Place 2 sprays into both nostrils at bedtime.     HYDROcodone-acetaminophen (NORCO/VICODIN) 5-325 MG tablet Take 1 tablet by mouth every 6 (six) hours as needed for moderate pain (pain score 4-6). (Patient not taking: Reported on 02/13/2023) 10 tablet 0   linaclotide (LINZESS) 290 MCG CAPS capsule Take 1 capsule (290 mcg  total) by mouth daily 30 minutes before breakfast 30 capsule 3   loratadine (CLARITIN) 10 MG tablet Take 10 mg by mouth at bedtime.     Melatonin 10 MG TABS Take 10 mg by mouth at bedtime.     meloxicam (MOBIC) 15 MG tablet Take 1 tablet (15 mg total) by mouth daily. 90 tablet 3   Multiple Vitamins-Minerals (ONE-A-DAY WOMENS PO) Take 1 tablet by mouth daily with breakfast.     Omega-3 Fatty Acids (FISH OIL) 1000 MG CAPS Take 1,000 mg by mouth 2 (two) times daily.     pantoprazole (PROTONIX) 40 MG tablet TAKE 1 TABLET EVERY DAY (Patient taking differently: Take 40 mg by mouth daily.) 90 tablet 3   polyethylene glycol (MIRALAX / GLYCOLAX) 17 g packet Take 17 g by mouth daily.     venlafaxine XR (EFFEXOR-XR) 150 MG 24 hr capsule Take 150 mg by mouth daily with breakfast. 30 capsule 3   Wheat Dextrin (BENEFIBER) POWD Take 1 Dose by mouth daily. 2 teaspoons     No current facility-administered medications on file prior to visit.   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    Employer: [employer]    Comment: LPN  Tobacco Use   Smoking status: Former    Current packs/day: 0.00    Types: Cigarettes    Quit date: 10/27/1993    Years since quitting: 29.6   Smokeless tobacco: Never  Vaping Use   Vaping status: Never Used  Substance and Sexual Activity   Alcohol use: Yes    Comment: one drink per week   Drug use: No   Sexual activity: Not Currently  Other Topics Concern   Not on file  Social History Narrative   Not on file   Social Drivers of Health   Financial Resource Strain: Low Risk   (05/29/2023)   Overall Financial Resource Strain (CARDIA)    Difficulty of Paying Living Expenses: Not very hard  Food Insecurity: No Food Insecurity (05/29/2023)   Hunger Vital Sign    Worried About Running Out of Food in the Last Year: Never true    Ran Out of Food in the Last Year: Never true  Transportation Needs: No Transportation Needs (05/29/2023)   PRAPARE - Administrator, Civil Service (Medical): No    Lack of Transportation (Non-Medical): No  Physical Activity: Sufficiently Active (05/29/2023)   Exercise Vital Sign    Days of Exercise per Week: 3 days    Minutes of Exercise per Session: 50 min  Stress: No Stress Concern Present (05/29/2023)   [employer] of Occupational Health -  Occupational Stress Questionnaire    Feeling of Stress : Only a little  Social Connections: Socially Isolated (05/29/2023)   Social Connection and Isolation Panel [NHANES]    Frequency of Communication with Friends and Family: More than three times a week    Frequency of Social Gatherings with Friends and Family: Patient declined    Attends Religious Services: Never    Database administrator or Organizations: No    Attends Banker Meetings: Never    Marital Status: Divorced  Catering manager Violence: Not At Risk (12/22/2022)   Humiliation, Afraid, Rape, and Kick questionnaire    Fear of Current or Ex-Partner: No    Emotionally Abused: No    Physically Abused: No    Sexually Abused: No   Family History  Problem Relation Age of Onset   Stroke Mother    Arthritis Mother    Thyroid disease Mother    Cancer Father    Arthritis Sister    Thyroid disease Sister    Cancer Maternal Grandmother    Stroke Maternal Grandfather    Cancer Maternal Grandfather    Colon cancer Maternal Grandfather    Cancer Paternal Grandmother    Heart disease Paternal Grandmother    Colon cancer Paternal Grandmother    Stroke Paternal Grandfather    Arthritis Sister    Obesity Sister     Sudden death Neg Hx    Hypertension Neg Hx    Hyperlipidemia Neg Hx    Heart attack Neg Hx    Diabetes Neg Hx    Rectal cancer Neg Hx    Stomach cancer Neg Hx    Esophageal cancer Neg Hx       Review of Systems  Neurological:  Positive for dizziness.  All other systems reviewed and are negative.      Objective:   Physical Exam Vitals reviewed.  Constitutional:      General: She is not in acute distress.    Appearance: She is obese. She is not ill-appearing, toxic-appearing or diaphoretic.  HENT:     Head: Normocephalic and atraumatic.  Neck:     Vascular: No carotid bruit.  Cardiovascular:     Rate and Rhythm: Normal rate and regular rhythm.     Pulses: Normal pulses.     Heart sounds: Normal heart sounds. No murmur heard.    No friction rub. No gallop.  Pulmonary:     Effort: Pulmonary effort is normal. No respiratory distress.     Breath sounds: Normal breath sounds. No stridor. No wheezing, rhonchi or rales.  Chest:     Chest wall: No tenderness.  Abdominal:     Palpations: Abdomen is soft.     Tenderness: There is no abdominal tenderness.  Musculoskeletal:     Cervical back: No muscular tenderness.     Right lower leg: No edema.     Left lower leg: No edema.  Neurological:     General: No focal deficit present.     Mental Status: She is alert and oriented to person, place, and time. Mental status is at baseline.     Cranial Nerves: No cranial nerve deficit.  Psychiatric:        Mood and Affect: Mood normal.        Behavior: Behavior normal.        Thought Content: Thought content normal.        Judgment: Judgment normal.          Assessment & Plan:  Benign essential HTN Her blood pressure is documented too high at home.  However I do not want to exacerbate her frequency of vasovagal syncope.  Therefore I am going to choose a beta-blocker to hopefully blunt some of the potential catecholamine response to triggers vasovagal syncope.  Begin Bystolic 2.5  mg daily and recheck blood pressure in 2 weeks.  Wear compression hose on both legs to improve venous return to the heart

## 2023-06-15 ENCOUNTER — Ambulatory Visit

## 2023-06-29 ENCOUNTER — Other Ambulatory Visit: Payer: Self-pay | Admitting: Family Medicine

## 2023-06-30 NOTE — Telephone Encounter (Signed)
 Requested medication (s) are due for refill today: yes  Requested medication (s) are on the active medication list: yes  Last refill:  07/23/22 #30/3  Future visit scheduled: yes  Notes to clinic:  Unable to refill per protocol, cannot delegate.      Requested Prescriptions  Pending Prescriptions Disp Refills   cyclobenzaprine  (FLEXERIL ) 10 MG tablet [Pharmacy Med Name: Cyclobenzaprine  HCl Oral Tablet 10 MG] 270 tablet 3    Sig: TAKE 1 TABLET THREE TIMES DAILY AS NEEDED FOR MUSCLE SPASM(S)     Not Delegated - Analgesics:  Muscle Relaxants Failed - 06/30/2023  1:28 PM      Failed - This refill cannot be delegated      Failed - Valid encounter within last 6 months    Recent Outpatient Visits           3 weeks ago Benign essential HTN   [place] [facility] Austine Lefort, MD   4 months ago Elevated blood sugar   [place] [facility] Cheril Cork, Cisco Crest, MD   8 months ago Near syncope   [place] [facility] Cheril Cork, Cisco Crest, MD   9 months ago Chronic pain of left knee   [place] [facility] Cheril Cork, Cisco Crest, MD   12 months ago General medical exam   [place] [facility] Austine Lefort, MD              Signed Prescriptions Disp Refills   pantoprazole  (PROTONIX ) 40 MG tablet 90 tablet 0    Sig: TAKE 1 TABLET EVERY DAY     Gastroenterology: Proton Pump Inhibitors Passed - 06/30/2023  1:28 PM      Passed - Valid encounter within last 12 months    Recent Outpatient Visits           3 weeks ago Benign essential HTN   [place] [facility] Austine Lefort, MD   4 months ago Elevated blood sugar   [place] [facility] Cheril Cork, Cisco Crest, MD   8 months ago Near syncope   [place] [facility] Cheril Cork, Cisco Crest, MD   9 months ago Chronic pain of left knee   [place] [facility] Cheril Cork,  Cisco Crest, MD   12 months ago General medical exam   [place] [facility] Pickard, Cisco Crest, MD               atorvastatin  (LIPITOR) 40 MG tablet 90 tablet 0    Sig: TAKE 1 TABLET EVERY DAY     Cardiovascular:  Antilipid - Statins Failed - 06/30/2023  1:28 PM      Failed - Lipid Panel in normal range within the last 12 months    Cholesterol, Total  Date Value Ref Range Status  03/25/2016 172 100 - 199 mg/dL Final   Cholesterol  Date Value Ref Range Status  07/04/2022 128 <200 mg/dL Final   LDL Cholesterol (Calc)  Date Value Ref Range Status  07/04/2022 52 mg/dL (calc) Final    Comment:    Reference range: <100 . Desirable range <100 mg/dL for primary prevention;   <70 mg/dL for patients with CHD or diabetic patients  with > or = 2 CHD risk factors. Aaron Aas LDL-C is now calculated using the Martin-Hopkins  calculation, which is a validated novel method providing  better accuracy than the [employer]  equation in the  estimation of LDL-C.  Melinda Sprawls et al. Erroll Heard. 1308;657(84): 2061-2068  (http://education.[facility].com/faq/FAQ164)    HDL  Date Value Ref Range Status  07/04/2022 57 > OR = 50 mg/dL Final  69/62/9528 59 >41 mg/dL Final   Triglycerides  Date Value Ref Range Status  07/04/2022 108 <150 mg/dL Final         Passed - Patient is not pregnant      Passed - Valid encounter within last 12 months    Recent Outpatient Visits           3 weeks ago Benign essential HTN   [place] [facility] Austine Lefort, MD   4 months ago Elevated blood sugar   [place] [facility] Cheril Cork, Cisco Crest, MD   8 months ago Near syncope   [place] [facility] Cheril Cork, Cisco Crest, MD   9 months ago Chronic pain of left knee   [place] [facility] Cheril Cork, Cisco Crest, MD   12 months ago General medical exam   [place] [facility] Austine Lefort, MD                venlafaxine  XR (EFFEXOR -XR) 150 MG 24 hr capsule 90 capsule 0    Sig: TAKE 1 CAPSULE EVERY DAY WITH BREAKFAST     Psychiatry: Antidepressants - SNRI - desvenlafaxine & venlafaxine  Failed - 06/30/2023  1:28 PM      Failed - Cr in normal range and within 360 days    Creat  Date Value Ref Range Status  02/13/2023 0.96 0.50 - 1.05 mg/dL Final   Creatinine, Ser  Date Value Ref Range Status  05/18/2023 1.27 (H) 0.44 - 1.00 mg/dL Final         Failed - Valid encounter within last 6 months    Recent Outpatient Visits           3 weeks ago Benign essential HTN   [place] [facility] Austine Lefort, MD   4 months ago Elevated blood sugar   [place] [facility] Pickard, Cisco Crest, MD   8 months ago Near syncope   [place] [facility] Cheril Cork, Cisco Crest, MD   9 months ago Chronic pain of left knee   [place] [facility] Cheril Cork, Cisco Crest, MD   12 months ago General medical exam   [place] [facility] Austine Lefort, MD              Failed - Lipid Panel in normal range within the last 12 months    Cholesterol, Total  Date Value Ref Range Status  03/25/2016 172 100 - 199 mg/dL Final   Cholesterol  Date Value Ref Range Status  07/04/2022 128 <200 mg/dL Final   LDL Cholesterol (Calc)  Date Value Ref Range Status  07/04/2022 52 mg/dL (calc) Final    Comment:    Reference range: <100 . Desirable range <100 mg/dL for primary prevention;   <70 mg/dL for patients with CHD or diabetic patients  with > or = 2 CHD risk factors. Aaron Aas LDL-C is now calculated using the Martin-Hopkins  calculation, which is a validated novel method providing  better accuracy than the Friedewald equation in the  estimation of LDL-C.  Melinda Sprawls et al. Erroll Heard. 3244;010(27): 2061-2068  (http://education.[facility].com/faq/FAQ164)    HDL  Date Value Ref Range  Status  07/04/2022 57 > OR  = 50 mg/dL Final  16/12/9602 59 >54 mg/dL Final   Triglycerides  Date Value Ref Range Status  07/04/2022 108 <150 mg/dL Final         Passed - Completed PHQ-2 or PHQ-9 in the last 360 days      Passed - Last BP in normal range    BP Readings from Last 1 Encounters:  06/04/23 138/76

## 2023-06-30 NOTE — Telephone Encounter (Signed)
 Requested Prescriptions  Pending Prescriptions Disp Refills   pantoprazole  (PROTONIX ) 40 MG tablet [Pharmacy Med Name: Pantoprazole  Sodium Oral Tablet Delayed Release 40 MG] 90 tablet 3    Sig: TAKE 1 TABLET EVERY DAY     Gastroenterology: Proton Pump Inhibitors Passed - 06/30/2023  1:22 PM      Passed - Valid encounter within last 12 months    Recent Outpatient Visits           3 weeks ago Benign essential HTN   [place] [facility] Austine Lefort, MD   4 months ago Elevated blood sugar   [place] [facility] Cheril Cork, Cisco Crest, MD   8 months ago Near syncope   [place] [facility] Cheril Cork, Cisco Crest, MD   9 months ago Chronic pain of left knee   [place] [facility] Cheril Cork, Cisco Crest, MD   12 months ago General medical exam   [place] [facility] Pickard, Cisco Crest, MD               atorvastatin  (LIPITOR) 40 MG tablet [Pharmacy Med Name: Atorvastatin  Calcium  Oral Tablet 40 MG] 90 tablet 3    Sig: TAKE 1 TABLET EVERY DAY     Cardiovascular:  Antilipid - Statins Failed - 06/30/2023  1:22 PM      Failed - Lipid Panel in normal range within the last 12 months    Cholesterol, Total  Date Value Ref Range Status  03/25/2016 172 100 - 199 mg/dL Final   Cholesterol  Date Value Ref Range Status  07/04/2022 128 <200 mg/dL Final   LDL Cholesterol (Calc)  Date Value Ref Range Status  07/04/2022 52 mg/dL (calc) Final    Comment:    Reference range: <100 . Desirable range <100 mg/dL for primary prevention;   <70 mg/dL for patients with CHD or diabetic patients  with > or = 2 CHD risk factors. Aaron Aas LDL-C is now calculated using the Martin-Hopkins  calculation, which is a validated novel method providing  better accuracy than the Friedewald equation in the  estimation of LDL-C.  Melinda Sprawls et al. Erroll Heard. 0981;191(47): 2061-2068  (http://education.[facility].com/faq/FAQ164)     HDL  Date Value Ref Range Status  07/04/2022 57 > OR = 50 mg/dL Final  82/95/6213 59 >08 mg/dL Final   Triglycerides  Date Value Ref Range Status  07/04/2022 108 <150 mg/dL Final         Passed - Patient is not pregnant      Passed - Valid encounter within last 12 months    Recent Outpatient Visits           3 weeks ago Benign essential HTN   [place] [facility] Austine Lefort, MD   4 months ago Elevated blood sugar   [place] [facility] Cheril Cork, Cisco Crest, MD   8 months ago Near syncope   [place] [facility] Austine Lefort, MD   9 months ago Chronic pain of left knee   [place] [facility] Cheril Cork, Cisco Crest, MD   12 months ago General medical exam   [place] [facility] Austine Lefort, MD               venlafaxine  XR (EFFEXOR -XR) 150 MG 24 hr capsule [Pharmacy Med Name: Venlafaxine  HCl ER Oral Capsule Extended Release 24 Hour 150 MG] 90  capsule 3    Sig: TAKE 1 CAPSULE EVERY DAY WITH BREAKFAST     Psychiatry: Antidepressants - SNRI - desvenlafaxine & venlafaxine  Failed - 06/30/2023  1:22 PM      Failed - Cr in normal range and within 360 days    Creat  Date Value Ref Range Status  02/13/2023 0.96 0.50 - 1.05 mg/dL Final   Creatinine, Ser  Date Value Ref Range Status  05/18/2023 1.27 (H) 0.44 - 1.00 mg/dL Final         Failed - Valid encounter within last 6 months    Recent Outpatient Visits           3 weeks ago Benign essential HTN   [place] [facility] Austine Lefort, MD   4 months ago Elevated blood sugar   [place] [facility] Cheril Cork, Cisco Crest, MD   8 months ago Near syncope   [place] [facility] Cheril Cork, Cisco Crest, MD   9 months ago Chronic pain of left knee   [name] [facility] Cheril Cork, Cisco Crest, MD   12 months ago General medical exam   [place]  [facility] Austine Lefort, MD              Failed - Lipid Panel in normal range within the last 12 months    Cholesterol, Total  Date Value Ref Range Status  03/25/2016 172 100 - 199 mg/dL Final   Cholesterol  Date Value Ref Range Status  07/04/2022 128 <200 mg/dL Final   LDL Cholesterol (Calc)  Date Value Ref Range Status  07/04/2022 52 mg/dL (calc) Final    Comment:    Reference range: <100 . Desirable range <100 mg/dL for primary prevention;   <70 mg/dL for patients with CHD or diabetic patients  with > or = 2 CHD risk factors. Aaron Aas LDL-C is now calculated using the Martin-Hopkins  calculation, which is a validated novel method providing  better accuracy than the Friedewald equation in the  estimation of LDL-C.  Melinda Sprawls et al. Erroll Heard. 1610;960(45): 2061-2068  (http://education.[facility].com/faq/FAQ164)    HDL  Date Value Ref Range Status  07/04/2022 57 > OR = 50 mg/dL Final  40/98/1191 59 >47 mg/dL Final   Triglycerides  Date Value Ref Range Status  07/04/2022 108 <150 mg/dL Final         Passed - Completed PHQ-2 or PHQ-9 in the last 360 days      Passed - Last BP in normal range    BP Readings from Last 1 Encounters:  06/04/23 138/76          cyclobenzaprine  (FLEXERIL ) 10 MG tablet [Pharmacy Med Name: Cyclobenzaprine  HCl Oral Tablet 10 MG] 270 tablet 3    Sig: TAKE 1 TABLET THREE TIMES DAILY AS NEEDED FOR MUSCLE SPASM(S)     Not Delegated - Analgesics:  Muscle Relaxants Failed - 06/30/2023  1:22 PM      Failed - This refill cannot be delegated      Failed - Valid encounter within last 6 months    Recent Outpatient Visits           3 weeks ago Benign essential HTN   [place] [facility] Austine Lefort, MD   4 months ago Elevated blood sugar   [place] [facility] Austine Lefort, MD   8 months ago Near syncope     [facility] Pickard, Cisco Crest, MD    9 months ago Chronic pain of left knee   [place] [facility] Pickard, Cisco Crest, MD   12 months ago General medical exam   [place] [facility] Pickard, Cisco Crest, MD

## 2023-07-07 LAB — HM MAMMOGRAPHY

## 2023-07-23 ENCOUNTER — Other Ambulatory Visit: Payer: Self-pay

## 2023-07-23 ENCOUNTER — Ambulatory Visit: Admitting: Family Medicine

## 2023-07-23 ENCOUNTER — Encounter: Payer: Self-pay | Admitting: Family Medicine

## 2023-07-23 ENCOUNTER — Encounter: Admitting: Family Medicine

## 2023-07-23 ENCOUNTER — Ambulatory Visit: Payer: Medicare Other

## 2023-07-23 VITALS — BP 120/76 | HR 81 | Temp 98.3°F | Ht 70.0 in | Wt 272.2 lb

## 2023-07-23 DIAGNOSIS — Z1211 Encounter for screening for malignant neoplasm of colon: Secondary | ICD-10-CM

## 2023-07-23 DIAGNOSIS — Z0001 Encounter for general adult medical examination with abnormal findings: Secondary | ICD-10-CM | POA: Diagnosis not present

## 2023-07-23 DIAGNOSIS — Z13220 Encounter for screening for lipoid disorders: Secondary | ICD-10-CM

## 2023-07-23 DIAGNOSIS — R5383 Other fatigue: Secondary | ICD-10-CM | POA: Diagnosis not present

## 2023-07-23 DIAGNOSIS — Z0000 Encounter for general adult medical examination without abnormal findings: Secondary | ICD-10-CM

## 2023-07-23 MED ORDER — NEBIVOLOL HCL 2.5 MG PO TABS: 2.5000 mg | ORAL_TABLET | Freq: Every day | ORAL | 1 refills | Status: DC

## 2023-07-23 NOTE — Progress Notes (Signed)
 Subjective:    [name], female    DOB: 12/02/1955, 68 y.o.   MRN: [id number]  HPI Patient is a very pleasant 68 year old Caucasian female who is here today for her annual wellness visit.  She has a history of ischemic colitis on 2 separate occasions in 2015 and 2021.  Both were thought to be due to hypotensive episodes.   Her colonoscopy was in 2021.  Her mammogram was performed in April 2025 and is up-to-date.  She does not require Pap smear due to her age.  She had a bone density in April of 2022 that showed no evidence of osteoporosis.  Immunizations are included below Immunization History  Administered Date(s) Administered   Fluad Quad(high Dose 65+) 04/14/2019, 12/23/2019, 01/11/2021, 12/02/2021   Fluad Trivalent(High Dose 65+) 12/15/2022   Influenza,inj,Quad PF,6+ Mos 11/11/2012, 11/17/2013, 12/22/2014, 12/06/2015, 12/09/2016   PFIZER Comirnaty Martina Sledge Top)Covid-19 Tri-Sucrose Vaccine 10/02/2020   PFIZER(Purple Top)SARS-COV-2 Vaccination 04/18/2019, 05/13/2019, 01/27/2020   Pfizer Covid-19 Vaccine Bivalent Booster 58yrs & up 01/11/2021   Pfizer(Comirnaty )Fall Seasonal Vaccine 12 years and older 02/04/2022, 12/15/2022   Pneumococcal Conjugate-13 09/17/2018   Pneumococcal Polysaccharide-23 06/05/2020   Respiratory Syncytial Virus Vaccine ,Recomb Aduvanted(Arexvy ) 02/04/2022   Tdap 05/23/2009, 06/05/2020   Zoster Recombinant(Shingrix) 05/09/2018   Zoster, Live 11/19/2015    Past Medical History:  Diagnosis Date   Anxiety    Phreesia 06/04/2020   Arthritis    Phreesia 07/08/2020   Chronic idiopathic constipation 07/05/2013   Chronic pain    back and knee   Colitis 09/2012   infectious vs inflammatory.    Depression    GERD (gastroesophageal reflux disease)    Phreesia 06/04/2020   Hyperlipidemia    Insulin  resistance 10/15/2016   Ischemic stricture intestine (HCC)    Lumbar radiculopathy 09/26/2022   Obesity    Osteoarthritis    Osteopenia     Spondylolisthesis of lumbar region 07/22/2022   Vitamin D  deficiency 04/08/2016   Past Surgical History:  Procedure Laterality Date   BIOPSY  02/21/2020   Procedure: BIOPSY;  Surgeon: Kenney Peacemaker, MD;  Location: [facility] ENDOSCOPY;  Service: Endoscopy;;   COLON RESECTION N/A 10/14/2013   Procedure: LAPAROSCOPIC MOBILIZATION OF SPLENIC FLEXURE, LAPAROSCOPIC EXTENDED LEFT COLECTOMY;  Surgeon: Fran Imus, MD;  Location: [facility] OR;  Service: General;  Laterality: N/A;   COLON SURGERY N/A    Phreesia 06/04/2020   COLONOSCOPY N/A 10/12/2013   Procedure: COLONOSCOPY;  Surgeon: Nannette Babe, MD;  Location: [facility] ENDOSCOPY;  Service: Endoscopy;  Laterality: N/A;   COLONOSCOPY WITH PROPOFOL  N/A 02/21/2020   Procedure: COLONOSCOPY WITH PROPOFOL ;  Surgeon: Kenney Peacemaker, MD;  Location[facility] ENDOSCOPY;  Service: Endoscopy;  Laterality: N/A;   DILATION AND CURETTAGE, DIAGNOSTIC / THERAPEUTIC  1987   OPEN REDUCTION INTERNAL FIXATION (ORIF) DISTAL RADIAL FRACTURE Right 08/08/2014   Procedure: OPEN REDUCTION INTERNAL FIXATION (ORIF) DISTAL RADIAL FRACTURE;  Surgeon: Ronn Cohn, MD;  Location: [facility] OR;  Service: Orthopedics;  Laterality: Right;  DVR Crosslock, MD available sometime around 1430-1500   ORIF ANKLE FRACTURE Right 11/10/2019   Procedure: POSSIBLEOPEN REDUCTION INTERNAL FIXATION (ORIF) ANKLE FRACTURE;  Surgeon: Laneta Pintos, MD;  Location: [facility] OR;  Service: Orthopedics;  Laterality: Right;   ORIF WRIST FRACTURE Left 11/10/2019   Procedure: OPEN REDUCTION INTERNAL FIXATION (ORIF) WRIST FRACTURE;  Surgeon: Laneta Pintos, MD;  Location: [facility] OR;  Service: Orthopedics;  Laterality: Left;   TONSILLECTOMY     TOTAL KNEE ARTHROPLASTY Right 08/05/2016   Procedure:  RIGHT TOTAL KNEE ARTHROPLASTY;  Surgeon: Arnie Lao, MD;  Location[facility];  Service: Orthopedics;  Laterality: Right;   Current Outpatient Medications on File Prior to Visit  Medication Sig Dispense Refill   acetaminophen  (TYLENOL )  650 MG CR tablet Take 1,300 mg by mouth at bedtime.     atorvastatin  (LIPITOR) 40 MG tablet TAKE 1 TABLET EVERY DAY 90 tablet 0   b complex vitamins capsule Take 1 capsule by mouth daily.     Calcium  Carb-Cholecalciferol  (CALCIUM  600+D3 PO) Take 1 tablet by mouth in the morning and at bedtime.     Cholecalciferol  (VITAMIN D -3) 25 MCG (1000 UT) CAPS Take 1,000 Units by mouth daily with breakfast.     cyclobenzaprine  (FLEXERIL ) 10 MG tablet TAKE 1 TABLET THREE TIMES DAILY AS NEEDED FOR MUSCLE SPASM(S) 270 tablet 3   docusate sodium  (COLACE) 100 MG capsule Take 1 capsule (100 mg total) by mouth 2 (two) times daily. 60 capsule 0   fluticasone  (FLONASE ) 50 MCG/ACT nasal spray Place 2 sprays into both nostrils at bedtime.     linaclotide  (LINZESS ) 290 MCG CAPS capsule Take 1 capsule (290 mcg total) by mouth daily 30 minutes before breakfast 30 capsule 3   loratadine  (CLARITIN ) 10 MG tablet Take 10 mg by mouth at bedtime.     Melatonin 10 MG TABS Take 10 mg by mouth at bedtime.     meloxicam  (MOBIC ) 15 MG tablet Take 1 tablet (15 mg total) by mouth daily. 90 tablet 3   Multiple Vitamins-Minerals (ONE-A-DAY WOMENS PO) Take 1 tablet by mouth daily with breakfast.     nebivolol  (BYSTOLIC ) 2.5 MG tablet Take 1 tablet (2.5 mg total) by mouth daily. 30 tablet 3   Omega-3 Fatty Acids (FISH OIL ) 1000 MG CAPS Take 1,000 mg by mouth 2 (two) times daily.     pantoprazole  (PROTONIX ) 40 MG tablet TAKE 1 TABLET EVERY DAY 90 tablet 0   polyethylene glycol (MIRALAX  / GLYCOLAX ) 17 g packet Take 17 g by mouth daily.     venlafaxine  XR (EFFEXOR -XR) 150 MG 24 hr capsule TAKE 1 CAPSULE EVERY DAY WITH BREAKFAST 90 capsule 0   Wheat Dextrin (BENEFIBER) POWD Take 1 Dose by mouth daily. 2 teaspoons     No current facility-administered medications on file prior to visit.   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    Employer: [employer]    Comment: LPN  Tobacco Use   Smoking status: Former    Current packs/day: 0.00    Types: Cigarettes    Quit date: 10/27/1993    Years since quitting: 29.7   Smokeless tobacco: Never  Vaping Use   Vaping status: Never Used  Substance and Sexual Activity   Alcohol use: Yes    Comment: one drink per week   Drug use: No   Sexual activity: Not Currently  Other Topics Concern   Not on file  Social History Narrative   Not on file   Social Drivers of Health   Financial Resource Strain: Low Risk  (05/29/2023)   Overall Physicist, medical Strain (  CARDIA)    Difficulty of Paying Living Expenses: Not very hard  Food Insecurity: No Food Insecurity (05/29/2023)   Hunger Vital Sign    Worried About Running Out of Food in the Last Year: Never true    Ran Out of Food in the Last Year: Never true  Transportation Needs: No Transportation Needs (05/29/2023)   PRAPARE - Administrator, Civil Service (Medical): No    Lack of Transportation (Non-Medical): No  Physical Activity: Sufficiently Active (05/29/2023)   Exercise Vital Sign    Days of Exercise per Week: 3 days    Minutes of Exercise per Session: 50 min  Stress: No Stress Concern Present (05/29/2023)   [employer] of Occupational Health - Occupational Stress Questionnaire    Feeling of Stress : Only a little  Social Connections: Socially Isolated (05/29/2023)   Social Connection and Isolation Panel [NHANES]    Frequency of Communication with Friends and Family: More than three times a week    Frequency of Social Gatherings with Friends and Family: Patient declined    Attends Religious Services: Never     Database administrator or Organizations: No    Attends Engineer, structural: Not on file    Marital Status: Divorced  Intimate Partner Violence: Not At Risk (12/22/2022)   Humiliation, Afraid, Rape, and Kick questionnaire    Fear of Current or Ex-Partner: No    Emotionally Abused: No    Physically Abused: No    Sexually Abused: No   Family History  Problem Relation Age of Onset   Stroke Mother    Arthritis Mother    Thyroid  disease Mother    Cancer Father    Arthritis Sister    Thyroid  disease Sister    Cancer Maternal Grandmother    Stroke Maternal Grandfather    Cancer Maternal Grandfather    Colon cancer Maternal Grandfather    Cancer Paternal Grandmother    Heart disease Paternal Grandmother    Colon cancer Paternal Grandmother    Stroke Paternal Grandfather    Arthritis Sister    Obesity Sister    Sudden death Neg Hx    Hypertension Neg Hx    Hyperlipidemia Neg Hx    Heart attack Neg Hx    Diabetes Neg Hx    Rectal cancer Neg Hx    Stomach cancer Neg Hx    Esophageal cancer Neg Hx       Review of Systems  All other systems reviewed and are negative.      Objective:   Physical Exam Vitals reviewed.  Constitutional:      General: She is not in acute distress.    Appearance: She is obese. She is not ill-appearing, toxic-appearing or diaphoretic.  HENT:     Head: Normocephalic and atraumatic.     Right Ear: Tympanic membrane, ear canal and external ear normal. There is no impacted cerumen.     Left Ear: Tympanic membrane, ear canal and external ear normal. There is no impacted cerumen.     Nose: Nose normal. No congestion or rhinorrhea.     Mouth/Throat:     Mouth: Mucous membranes are moist.     Pharynx: Oropharynx is clear. No oropharyngeal exudate or posterior oropharyngeal erythema.  Eyes:     General: No scleral icterus.       Right eye: No discharge.        Left eye: No discharge.     Extraocular Movements: Extraocular movements  intact.     Conjunctiva/sclera: Conjunctivae normal.     Pupils: Pupils are equal, round, and reactive to light.  Neck:     Vascular: No carotid bruit.  Cardiovascular:     Rate and Rhythm: Normal rate and regular rhythm.     Pulses: Normal pulses.     Heart sounds: Normal heart sounds. No murmur heard.    No friction rub. No gallop.  Pulmonary:     Effort: Pulmonary effort is normal. No respiratory distress.     Breath sounds: Normal breath sounds. No stridor. No wheezing, rhonchi or rales.  Chest:     Chest wall: No tenderness.  Abdominal:     General: Bowel sounds are normal. There is no distension.     Palpations: Abdomen is soft. There is no mass.     Tenderness: There is no abdominal tenderness. There is no right CVA tenderness, left CVA tenderness, guarding or rebound.     Hernia: No hernia is present.  Musculoskeletal:     Cervical back: Normal range of motion. No muscular tenderness.     Left knee: No effusion. Decreased range of motion. Tenderness present over the medial joint line.     Right lower leg: No edema.     Left lower leg: No edema.  Lymphadenopathy:     Cervical: No cervical adenopathy.  Skin:    General: Skin is warm.     Coloration: Skin is not jaundiced or pale.     Findings: No bruising, erythema, lesion or rash.  Neurological:     General: No focal deficit present.     Mental Status: She is alert and oriented to person, place, and time. Mental status is at baseline.     Cranial Nerves: No cranial nerve deficit.     Sensory: No sensory deficit.     Motor: No weakness.     Coordination: Coordination normal.     Gait: Gait normal.     Deep Tendon Reflexes: Reflexes normal.  Psychiatric:        Mood and Affect: Mood normal.        Behavior: Behavior normal.        Thought Content: Thought content normal.        Judgment: Judgment normal.           Assessment & Plan:  Encounter for Medicare annual wellness exam  Colon cancer screening -  Plan: Ambulatory referral to Gastroenterology  Screening cholesterol level - Plan: CBC with Differential/Platelet, Comprehensive metabolic panel with GFR, Lipid panel  Fatigue, unspecified type - Plan: CBC with Differential/Platelet, TSH With regards to her immunizations, she is up-to-date on every vaccine.  She is due for a colonoscopy.  I will schedule her to see GI.  She does not require Pap smear.  Going to density is due again in 2027.  Mammogram is up-to-date.  I will check CBC CMP lipid panel and due to fatigue I will also check a TSH.

## 2023-07-23 NOTE — Progress Notes (Signed)
 Subjective:   Monique Gonzalez is a 68 y.o. female who presents for Medicare Annual (Subsequent) preventive examination.  Visit Complete: In person  Patient Medicare AWV questionnaire was completed by the patient on 07/23/2023; I have confirmed that all information answered by patient is correct and no changes since this date.        Objective:     There were no vitals filed for this visit. There is no height or weight on file to calculate BMI.     05/18/2023   10:25 AM 12/22/2022    3:48 AM 12/21/2022    9:12 PM 09/02/2022   11:40 AM 07/22/2022    8:42 AM 07/16/2022    1:20 PM 07/10/2022   10:46 AM  Advanced Directives  Does Patient Have a Medical Advance Directive? Yes Yes No Yes Yes No;Yes No  Type of Estate agent of [place];Living will Living will  Healthcare Power of [place];Living will Living will Healthcare Power of [place];Living will   Does patient want to make changes to medical advance directive?  No - Patient declined   No - Patient declined No - Patient declined   Copy of Healthcare Power of Attorney in Chart?    No - copy requested No - copy requested No - copy requested   Would patient like information on creating a medical advance directive?      No - Patient declined No - Patient declined    Current Medications (verified) Outpatient Encounter Medications as of 07/23/2023  Medication Sig   acetaminophen  (TYLENOL ) 650 MG CR tablet Take 1,300 mg by mouth at bedtime.   atorvastatin  (LIPITOR) 40 MG tablet TAKE 1 TABLET EVERY DAY   b complex vitamins capsule Take 1 capsule by mouth daily.   Calcium  Carb-Cholecalciferol  (CALCIUM  600+D3 PO) Take 1 tablet by mouth in the morning and at bedtime.   Cholecalciferol  (VITAMIN D -3) 25 MCG (1000 UT) CAPS Take 1,000 Units by mouth daily with breakfast.   cyclobenzaprine  (FLEXERIL ) 10 MG tablet TAKE 1 TABLET THREE TIMES DAILY AS NEEDED FOR MUSCLE SPASM(S)   docusate sodium  (COLACE) 100 MG capsule Take 1  capsule (100 mg total) by mouth 2 (two) times daily.   fluticasone  (FLONASE ) 50 MCG/ACT nasal spray Place 2 sprays into both nostrils at bedtime.   linaclotide  (LINZESS ) 290 MCG CAPS capsule Take 1 capsule (290 mcg total) by mouth daily 30 minutes before breakfast   loratadine  (CLARITIN ) 10 MG tablet Take 10 mg by mouth at bedtime.   Melatonin 10 MG TABS Take 10 mg by mouth at bedtime.   meloxicam  (MOBIC ) 15 MG tablet Take 1 tablet (15 mg total) by mouth daily.   Multiple Vitamins-Minerals (ONE-A-DAY WOMENS PO) Take 1 tablet by mouth daily with breakfast.   nebivolol  (BYSTOLIC ) 2.5 MG tablet Take 1 tablet (2.5 mg total) by mouth daily.   Omega-3 Fatty Acids (FISH OIL ) 1000 MG CAPS Take 1,000 mg by mouth 2 (two) times daily.   pantoprazole  (PROTONIX ) 40 MG tablet TAKE 1 TABLET EVERY DAY   polyethylene glycol (MIRALAX  / GLYCOLAX ) 17 g packet Take 17 g by mouth daily.   venlafaxine  XR (EFFEXOR -XR) 150 MG 24 hr capsule TAKE 1 CAPSULE EVERY DAY WITH BREAKFAST   Wheat Dextrin (BENEFIBER) POWD Take 1 Dose by mouth daily. 2 teaspoons   No facility-administered encounter medications on file as of 07/23/2023.    Allergies (verified) Ciprofloxacin , Penicillins, and Sulfa antibiotics   History: Past Medical History:  Diagnosis Date   Anxiety  Phreesia 06/04/2020   Arthritis    Phreesia 07/08/2020   Chronic idiopathic constipation 07/05/2013   Chronic pain    back and knee   Colitis 09/2012   infectious vs inflammatory.    Depression    GERD (gastroesophageal reflux disease)    Phreesia 06/04/2020   Hyperlipidemia    Insulin  resistance 10/15/2016   Ischemic stricture intestine (HCC)    Lumbar radiculopathy 09/26/2022   Obesity    Osteoarthritis    Osteopenia    Spondylolisthesis of lumbar region 07/22/2022   Vitamin D  deficiency 04/08/2016   Past Surgical History:  Procedure Laterality Date   BIOPSY  02/21/2020   Procedure: BIOPSY;  Surgeon: Kenney Peacemaker, MD;  Location: [facility]  ENDOSCOPY;  Service: Endoscopy;;   COLON RESECTION N/A 10/14/2013   Procedure: LAPAROSCOPIC MOBILIZATION OF SPLENIC FLEXURE, LAPAROSCOPIC EXTENDED LEFT COLECTOMY;  Surgeon: Fran Imus, MD;  Location[facility];  Service: General;  Laterality: N/A;   COLON SURGERY N/A    Phreesia 06/04/2020   COLONOSCOPY N/A 10/12/2013   Procedure: COLONOSCOPY;  Surgeon: Nannette Babe, MD;  Location: [facility] ENDOSCOPY;  Service: Endoscopy;  Laterality: N/A;   COLONOSCOPY WITH PROPOFOL  N/A 02/21/2020   Procedure: COLONOSCOPY WITH PROPOFOL ;  Surgeon: Kenney Peacemaker, MD;  Location: [facility] ENDOSCOPY;  Service: Endoscopy;  Laterality: N/A;   DILATION AND CURETTAGE, DIAGNOSTIC / THERAPEUTIC  1987   OPEN REDUCTION INTERNAL FIXATION (ORIF) DISTAL RADIAL FRACTURE Right 08/08/2014   Procedure: OPEN REDUCTION INTERNAL FIXATION (ORIF) DISTAL RADIAL FRACTURE;  Surgeon: Ronn Cohn, MD;  Location: [facility] OR;  Service: Orthopedics;  Laterality: Right;  DVR Crosslock, MD available sometime around 1430-1500   ORIF ANKLE FRACTURE Right 11/10/2019   Procedure: POSSIBLEOPEN REDUCTION INTERNAL FIXATION (ORIF) ANKLE FRACTURE;  Surgeon: Laneta Pintos, MD;  Location: [facility] OR;  Service: Orthopedics;  Laterality: Right;   ORIF WRIST FRACTURE Left 11/10/2019   Procedure: OPEN REDUCTION INTERNAL FIXATION (ORIF) WRIST FRACTURE;  Surgeon: Laneta Pintos, MD;  Location: [facility] OR;  Service: Orthopedics;  Laterality: Left;   TONSILLECTOMY     TOTAL KNEE ARTHROPLASTY Right 08/05/2016   Procedure: RIGHT TOTAL KNEE ARTHROPLASTY;  Surgeon: Arnie Lao, MD;  Location: [facility] OR;  Service: Orthopedics;  Laterality: Right;   Family History  Problem Relation Age of Onset   Stroke Mother    Arthritis Mother    Thyroid  disease Mother    Cancer Father    Arthritis Sister    Thyroid  disease Sister    Cancer Maternal Grandmother    Stroke Maternal Grandfather    Cancer Maternal Grandfather    Colon cancer Maternal Grandfather    Cancer Paternal  Grandmother    Heart disease Paternal Grandmother    Colon cancer Paternal Grandmother    Stroke Paternal Grandfather    Arthritis Sister    Obesity Sister    Sudden death Neg Hx    Hypertension Neg Hx    Hyperlipidemia Neg Hx    Heart attack Neg Hx    Diabetes Neg Hx    Rectal cancer Neg Hx    Stomach cancer Neg Hx    Esophageal cancer Neg Hx    Social History   Socioeconomic History   Marital status: Divorced    Spouse name: Not on file   Number of children: 2   Years of education: college   Highest education level: Associate degree: occupational, Scientist, product/process development, or vocational program  Occupational History   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    Employer: [employer]    Comment: LPN  Tobacco Use   Smoking status: Former    Current packs/day: 0.00    Types: Cigarettes    Quit date: 10/27/1993    Years since quitting: 29.7   Smokeless tobacco: Never  Vaping Use   Vaping status: Never Used  Substance and Sexual Activity   Alcohol use: Yes    Comment: one drink per week   Drug use: No   Sexual activity: Not Currently  Other Topics Concern   Not on file  Social History Narrative   Not on file   Social Drivers of Health   Financial Resource Strain: Low Risk  (05/29/2023)   Overall Financial Resource Strain (CARDIA)    Difficulty of Paying Living Expenses: Not very hard  Food Insecurity: No Food Insecurity (05/29/2023)   Hunger Vital Sign    Worried About Running Out of Food in the Last Year: Never true    Ran Out of Food in the Last Year: Never true  Transportation Needs: No Transportation Needs (05/29/2023)   PRAPARE - Administrator, Civil Service (Medical): No    Lack of Transportation (Non-Medical): No  Physical Activity: Sufficiently Active (05/29/2023)   Exercise Vital Sign    Days of Exercise per Week: 3 days    Minutes of Exercise per Session: 50 min  Stress: No Stress Concern Present (05/29/2023)   [employer] of Occupational  Health - Occupational Stress Questionnaire    Feeling of Stress : Only a little  Social Connections: Socially Isolated (05/29/2023)   Social Connection and Isolation Panel [NHANES]    Frequency of Communication with Friends and Family: More than three times a week    Frequency of Social Gatherings with Friends and Family: Patient declined    Attends Religious Services: Never    Database administrator or Organizations: No    Attends Engineer, structural: Not on file    Marital Status: Divorced    Tobacco Counseling Counseling given: Not Answered   Clinical Intake:                        Activities of Daily Living    07/21/2023    9:46 AM 12/22/2022    3:59 AM  In your present state of health, do you have any difficulty performing the following activities:  Hearing? 0   Vision? 0   Difficulty concentrating or making decisions? 0   Walking or climbing stairs? 1   Dressing or bathing? 0   Doing errands, shopping? 0 0  Preparing Food and eating ? N   Using the Toilet? N   In the past six months, have you accidently leaked urine? N   Do you have problems with loss of bowel control? N   Managing your Medications? N   Managing your Finances? N   Housekeeping or managing your Housekeeping? N     Patient Care Team: Austine Lefort, MD as PCP - General (Family Medicine) Madireddy, Daymon Evans, MD as PCP - Cardiology (Cardiology) Augusto Blonder, MD as Consulting Physician (Neurosurgery) Graciella Lavender, [place] as Physician Assistant (Gastroenterology)  Indicate any recent Medical Services you may have received from other than [facility] providers in the past year (date may be approximate).     Assessment:    This is a routine wellness examination for [name]  Hearing/Vision screen No results found.   Goals Addressed   None  Depression Screen    02/13/2023    2:16 PM 07/10/2022   10:51 AM 02/24/2022    4:12 PM 01/03/2022    8:55 AM 06/06/2021     8:32 AM 06/05/2020    8:30 AM 09/17/2018    4:03 PM  PHQ 2/9 Scores  PHQ - 2 Score 4 0 3 0 2 6 6   PHQ- 9 Score 15  14  3 18 21     Fall Risk    07/21/2023    9:46 AM 02/13/2023    2:15 PM 07/10/2022   10:45 AM 07/06/2022   11:51 AM 02/24/2022    3:59 PM  Fall Risk   Falls in the past year? 1 1 1 1 1   Number falls in past yr: 0 1 0 0 1  Injury with Fall? 1 1 1 1 1   Risk for fall due to :   Impaired balance/gait;Impaired mobility;History of fall(s)  History of fall(s);Impaired balance/gait;Orthopedic patient  Follow up   Education provided;Falls prevention discussed;Falls evaluation completed  Education provided;Falls prevention discussed    MEDICARE RISK AT HOME: Medicare Risk at Home Any stairs in or around the home?: (Patient-Rptd) Yes If so, are there any without handrails?: (Patient-Rptd) No Home free of loose throw rugs in walkways, pet beds, electrical cords, etc?: (Patient-Rptd) No Adequate lighting in your home to reduce risk of falls?: (Patient-Rptd) Yes Life alert?: (Patient-Rptd) No Use of a cane, walker or w/c?: (Patient-Rptd) Yes Grab bars in the bathroom?: (Patient-Rptd) Yes Shower chair or bench in shower?: (Patient-Rptd) Yes Elevated toilet seat or a handicapped toilet?: (Patient-Rptd) No  TIMED UP AND GO:  Was the test performed?  Yes  Length of time to ambulate 10 feet: 8 sec Gait steady and fast without use of assistive device    Cognitive Function:        07/10/2022   10:46 AM  6CIT Screen  What Year? 0 points  What month? 0 points  What time? 0 points  Count back from 20 0 points  Months in reverse 0 points  Repeat phrase 0 points  Total Score 0 points    Immunizations Immunization History  Administered Date(s) Administered   Fluad Quad(high Dose 65+) 04/14/2019, 12/23/2019, 01/11/2021, 12/02/2021   Fluad Trivalent(High Dose 65+) 12/15/2022   Influenza,inj,Quad PF,6+ Mos 11/11/2012, 11/17/2013, 12/22/2014, 12/06/2015, 12/09/2016   PFIZER  Comirnaty Martina Sledge Top)Covid-19 Tri-Sucrose Vaccine 10/02/2020   PFIZER(Purple Top)SARS-COV-2 Vaccination 04/18/2019, 05/13/2019, 01/27/2020   Pfizer Covid-19 Vaccine Bivalent Booster 42yrs & up 01/11/2021   Pfizer(Comirnaty )Fall Seasonal Vaccine 12 years and older 02/04/2022, 12/15/2022   Pneumococcal Conjugate-13 09/17/2018   Pneumococcal Polysaccharide-23 06/05/2020   Respiratory Syncytial Virus Vaccine ,Recomb Aduvanted(Arexvy ) 02/04/2022   Tdap 05/23/2009, 06/05/2020   Zoster Recombinant(Shingrix) 05/09/2018   Zoster, Live 11/19/2015    TDAP status: Up to date  Flu Vaccine status: Up to date  Pneumococcal vaccine status: Up to date  Covid-19 vaccine status: Completed vaccines  Qualifies for Shingles Vaccine? Yes   Zostavax completed Yes   Shingrix Completed?: No.    Education has been provided regarding the importance of this vaccine. Patient has been advised to call insurance company to determine out of pocket expense if they have not yet received this vaccine. Advised may also receive vaccine at local pharmacy or Health Dept. Verbalized acceptance and understanding.  Screening Tests Health Maintenance  Topic Date Due   COVID-19 Vaccine (8 - 2024-25 season) 07/30/2023 (Originally 06/15/2023)   Medicare Annual Wellness (AWV)  08/14/2023 (Originally 07/10/2023)   Zoster Vaccines-  Shingrix (2 of 2) 10/14/2023 (Originally 07/04/2018)   INFLUENZA VACCINE  10/09/2023   MAMMOGRAM  07/06/2025   Colonoscopy  02/20/2030   DTaP/Tdap/Td (3 - Td or Tdap) 06/06/2030   Pneumonia Vaccine 43+ Years old  Completed   DEXA SCAN  Completed   Hepatitis C Screening  Completed   HPV VACCINES  Aged Out   Meningococcal B Vaccine  Aged Out    Health Maintenance  There are no preventive care reminders to display for this patient.  Colorectal cancer screening: Type of screening: Colonoscopy. Completed 02/21/2020. Repeat every 10 years  Mammogram status: Completed 07/07/2023. Repeat every year  Bone  Density status: Completed 06/20/2020. Results reflect: Bone density results: NORMAL. Repeat every 2 years.  Lung Cancer Screening: (Low Dose CT Chest recommended if Age 75-80 years, 20 pack-year currently smoking OR have quit w/in 15years.) does not qualify.   Lung Cancer Screening Referral: n/a  Additional Screening:  Hepatitis C Screening: does qualify; Completed 12/11/2016  Vision Screening: Recommended annual ophthalmology exams for early detection of glaucoma and other disorders of the eye. Is the patient up to date with their annual eye exam?  Yes  Who is the provider or what is the name of the office in which the patient attends annual eye exams? [facility] If pt is not established with a provider, would they like to be referred to a provider to establish care? No .   Dental Screening: Recommended annual dental exams for proper oral hygiene  Diabetic Foot Exam: N/A  Community Resource Referral / Chronic Care Management: CRR required this visit?  No   CCM required this visit?  No     Plan:     I have personally reviewed and noted the following in the patient's chart:   Medical and social history Use of alcohol, tobacco or illicit drugs  Current medications and supplements including opioid prescriptions. Patient is not currently taking opioid prescriptions. Functional ability and status Nutritional status Physical activity Advanced directives List of other physicians Hospitalizations, surgeries, and ER visits in previous 12 months Vitals Screenings to include cognitive, depression, and falls Referrals and appointments  In addition, I have reviewed and discussed with patient certain preventive protocols, quality metrics, and best practice recommendations. A written personalized care plan for preventive services as well as general preventive health recommendations were provided to patient.     Monique Golder, LPN   [id number]   After Visit Summary: (In  Person-Printed) AVS printed and given to the patient  Nurse Notes: Discussed Shingles vaccine   I have collaborated with the care management provider regarding care management and care coordination activities outlined in this encounter and have reviewed this encounter including documentation in the note and care plan. I am certifying that I agree with the content of this note and encounter as supervising physician.

## 2023-07-24 ENCOUNTER — Ambulatory Visit: Payer: Self-pay | Admitting: Family Medicine

## 2023-07-24 LAB — LIPID PANEL
Cholesterol: 163 mg/dL (ref <200)
HDL: 45 mg/dL — ABNORMAL LOW (ref >=50)
LDL Cholesterol (Calc): 92 mg/dL
Non-HDL Cholesterol (Calc): 118 mg/dL (ref <130)
Total CHOL/HDL Ratio: 3.6 (calc) (ref <5.0)
Triglycerides: 159 mg/dL — ABNORMAL HIGH (ref <150)

## 2023-07-24 LAB — COMPREHENSIVE METABOLIC PANEL WITH GFR
AG Ratio: 2.4 (calc) (ref 1.0–2.5)
ALT: 12 U/L (ref 6–29)
AST: 15 U/L (ref 10–35)
Albumin: 4.3 g/dL (ref 3.6–5.1)
Alkaline phosphatase (APISO): 58 U/L (ref 37–153)
BUN/Creatinine Ratio: 19 (calc) (ref 6–22)
BUN: 20 mg/dL (ref 7–25)
CO2: 28 mmol/L (ref 20–32)
Calcium: 9.6 mg/dL (ref 8.6–10.4)
Chloride: 102 mmol/L (ref 98–110)
Creat: 1.08 mg/dL — ABNORMAL HIGH (ref 0.50–1.05)
Globulin: 1.8 g/dL — ABNORMAL LOW (ref 1.9–3.7)
Glucose, Bld: 104 mg/dL — ABNORMAL HIGH (ref 65–99)
Potassium: 4.9 mmol/L (ref 3.5–5.3)
Sodium: 139 mmol/L (ref 135–146)
Total Bilirubin: 0.4 mg/dL (ref 0.2–1.2)
Total Protein: 6.1 g/dL (ref 6.1–8.1)
eGFR: 56 mL/min/{1.73_m2} — ABNORMAL LOW (ref >=60)

## 2023-07-24 LAB — CBC WITH DIFFERENTIAL/PLATELET
Absolute Lymphocytes: 1400 {cells}/uL (ref 850–3900)
Absolute Monocytes: 588 {cells}/uL (ref 200–950)
Basophils Absolute: 28 {cells}/uL (ref 0–200)
Basophils Relative: 0.5 %
Eosinophils Absolute: 90 {cells}/uL (ref 15–500)
Eosinophils Relative: 1.6 %
HCT: 38.9 % (ref 35.0–45.0)
Hemoglobin: 12.4 g/dL (ref 11.7–15.5)
MCH: 29.5 pg (ref 27.0–33.0)
MCHC: 31.9 g/dL — ABNORMAL LOW (ref 32.0–36.0)
MCV: 92.4 fL (ref 80.0–100.0)
MPV: 9.6 fL (ref 7.5–12.5)
Monocytes Relative: 10.5 %
Neutro Abs: 3494 {cells}/uL (ref 1500–7800)
Neutrophils Relative %: 62.4 %
Platelets: 248 10*3/uL (ref 140–400)
RBC: 4.21 10*6/uL (ref 3.80–5.10)
RDW: 14.8 % (ref 11.0–15.0)
Total Lymphocyte: 25 %
WBC: 5.6 10*3/uL (ref 3.8–10.8)

## 2023-07-24 LAB — TSH: TSH: 1.94 m[IU]/L (ref 0.40–4.50)

## 2023-09-21 ENCOUNTER — Ambulatory Visit: Admitting: Physician Assistant

## 2023-09-28 ENCOUNTER — Other Ambulatory Visit: Payer: Self-pay

## 2023-09-28 ENCOUNTER — Encounter: Payer: Self-pay | Admitting: Family Medicine

## 2023-09-28 MED ORDER — PANTOPRAZOLE SODIUM 40 MG PO TBEC: 40.0000 mg | DELAYED_RELEASE_TABLET | Freq: Every day | ORAL | 0 refills | Status: DC

## 2023-09-28 MED ORDER — CYCLOBENZAPRINE HCL 10 MG PO TABS: 10.0000 mg | ORAL_TABLET | Freq: Three times a day (TID) | ORAL | 3 refills | Status: DC | PRN

## 2023-09-28 MED ORDER — VENLAFAXINE HCL ER 150 MG PO CP24: ORAL_CAPSULE | ORAL | 0 refills | Status: DC

## 2023-09-28 MED ORDER — NEBIVOLOL HCL 2.5 MG PO TABS: 2.5000 mg | ORAL_TABLET | Freq: Every day | ORAL | 1 refills | Status: DC

## 2023-09-28 MED ORDER — ATORVASTATIN CALCIUM 40 MG PO TABS: 40.0000 mg | ORAL_TABLET | Freq: Every day | ORAL | 0 refills | Status: DC

## 2023-09-28 MED ORDER — MELOXICAM 15 MG PO TABS: 15.0000 mg | ORAL_TABLET | Freq: Every day | ORAL | 3 refills | Status: AC

## 2023-09-29 ENCOUNTER — Other Ambulatory Visit: Payer: Self-pay

## 2023-09-29 DIAGNOSIS — M5431 Sciatica, right side: Secondary | ICD-10-CM

## 2023-09-29 DIAGNOSIS — G8929 Other chronic pain: Secondary | ICD-10-CM

## 2023-09-29 DIAGNOSIS — M48061 Spinal stenosis, lumbar region without neurogenic claudication: Secondary | ICD-10-CM

## 2023-09-29 MED ORDER — CYCLOBENZAPRINE HCL 10 MG PO TABS: 10.0000 mg | ORAL_TABLET | Freq: Three times a day (TID) | ORAL | 3 refills | Status: AC | PRN

## 2023-12-17 ENCOUNTER — Other Ambulatory Visit (HOSPITAL_BASED_OUTPATIENT_CLINIC_OR_DEPARTMENT_OTHER): Payer: Self-pay

## 2023-12-17 MED ORDER — COMIRNATY 30 MCG/0.3ML IM SUSY
0.3000 mL | PREFILLED_SYRINGE | Freq: Once | INTRAMUSCULAR | 0 refills | Status: AC
Start: 2023-12-17 — End: 2023-12-18
  Filled 2023-12-17: qty 0.3, 1d supply, fill #0

## 2023-12-17 MED ORDER — FLUZONE HIGH-DOSE 0.5 ML IM SUSY
0.5000 mL | PREFILLED_SYRINGE | Freq: Once | INTRAMUSCULAR | 0 refills | Status: AC
  Filled 2023-12-17: qty 0.5, 1d supply, fill #0

## 2023-12-21 ENCOUNTER — Other Ambulatory Visit: Payer: Self-pay | Admitting: Family Medicine

## 2023-12-31 ENCOUNTER — Encounter: Payer: Self-pay | Admitting: Family Medicine

## 2023-12-31 ENCOUNTER — Other Ambulatory Visit: Payer: Self-pay | Admitting: Family Medicine

## 2024-01-01 ENCOUNTER — Other Ambulatory Visit: Payer: Self-pay

## 2024-01-01 DIAGNOSIS — Z13820 Encounter for screening for osteoporosis: Secondary | ICD-10-CM

## 2024-01-11 ENCOUNTER — Encounter: Payer: Self-pay | Admitting: Radiology

## 2024-02-03 ENCOUNTER — Ambulatory Visit
Admission: RE | Admit: 2024-02-03 | Discharge: 2024-02-03 | Disposition: A | Discharge status: Home / Self Care | Attending: Family Medicine | Admitting: Family Medicine

## 2024-02-03 ENCOUNTER — Ambulatory Visit: Payer: Self-pay

## 2024-02-03 VITALS — BP 124/80 | HR 91 | Temp 98.9°F | Resp 18 | Ht 70.0 in | Wt 265.0 lb

## 2024-02-03 DIAGNOSIS — L03032 Cellulitis of left toe: Secondary | ICD-10-CM

## 2024-02-03 MED ORDER — CEFADROXIL 500 MG PO CAPS: 500.0000 mg | ORAL_CAPSULE | Freq: Two times a day (BID) | ORAL | 0 refills | Status: AC

## 2024-02-03 NOTE — Discharge Instructions (Addendum)
 Take Duricef 2 times a day Warm soaks.  2 times a day.  Dry thoroughly See your doctor if not improving by next week

## 2024-02-03 NOTE — ED Provider Notes (Signed)
 [facility] C[facility]    CSN: [id number] Arrival date & time: 02/03/24  9060      History   Chief Complaint Chief Complaint  Patient presents with   Foot Injury    Swollen infected toe on left foot 5th toe - Entered by patient    HPI Monique Gonzalez is a 68 y.o. female.   HPI  Patient has a swollen area on her left little toe that is getting worse.  Red.  Tender.  She worries there is infection.  Patient is a retired engineer, civil (consulting). She has multiple antibiotic allergies Patient is not diabetic  Past Medical History:  Diagnosis Date   Anxiety    Phreesia 06/04/2020   Arthritis    Phreesia 07/08/2020   Chronic idiopathic constipation 07/05/2013   Chronic pain    back and knee   Colitis 09/2012   infectious vs inflammatory.    Depression    GERD (gastroesophageal reflux disease)    Phreesia 06/04/2020   Hyperlipidemia    Insulin  resistance 10/15/2016   Ischemic stricture intestine    Lumbar radiculopathy 09/26/2022   Obesity    Osteoarthritis    Osteopenia    Spondylolisthesis of lumbar region 07/22/2022   Vitamin D  deficiency 04/08/2016    Patient Active Problem List   Diagnosis Date Noted   Closed right femoral fracture (HCC) 12/22/2022   Periprosthetic fracture around internal prosthetic right knee joint 12/22/2022   Near syncope 12/08/2022   Allergy    Anxiety    Arthritis    Chronic pain    GERD (gastroesophageal reflux disease)    Obesity    Osteopenia    Seasonal allergies    Lumbar radiculopathy 09/26/2022   Spondylolisthesis of lumbar region 07/22/2022   Status post lumbar spinal fusion 07/22/2022   Ischemic stricture intestine    Colitis 02/19/2020   Closed fracture of left distal radius 11/10/2019   Fall 11/10/2019   Closed right ankle fracture 11/09/2019   Ankle fracture 11/09/2019   Closed displaced fracture of styloid process of left ulna    Anxiety and depression 12/22/2016   Insulin  resistance 10/15/2016   Presence of right artificial  knee joint 10/14/2016   Vitamin D  deficiency 04/08/2016   Constipation 09/20/2013   Chronic idiopathic constipation 07/05/2013   Recurrent cold sores    Osteoarthritis    Depression    Hyperlipidemia     Past Surgical History:  Procedure Laterality Date   BIOPSY  02/21/2020   Procedure: BIOPSY;  Surgeon: Avram Lupita BRAVO, MD;  Location: [facility] ENDOSCOPY;  Service: Endoscopy;;   COLON RESECTION N/A 10/14/2013   Procedure: LAPAROSCOPIC MOBILIZATION OF SPLENIC FLEXURE, LAPAROSCOPIC EXTENDED LEFT COLECTOMY;  Surgeon: Camellia CHRISTELLA Blush, MD;  Location: [facility] OR;  Service: General;  Laterality: N/A;   COLON SURGERY N/A    Phreesia 06/04/2020   COLONOSCOPY N/A 10/12/2013   Procedure: COLONOSCOPY;  Surgeon: Gordy CHRISTELLA Starch, MD;  Location: [facility] ENDOSCOPY;  Service: Endoscopy;  Laterality: N/A;   COLONOSCOPY WITH PROPOFOL  N/A 02/21/2020   Procedure: COLONOSCOPY WITH PROPOFOL ;  Surgeon: Avram Lupita BRAVO, MD;  Location: [facility] ENDOSCOPY;  Service: Endoscopy;  Laterality: N/A;   DILATION AND CURETTAGE, DIAGNOSTIC / THERAPEUTIC  1987   OPEN REDUCTION INTERNAL FIXATION (ORIF) DISTAL RADIAL FRACTURE Right 08/08/2014   Procedure: OPEN REDUCTION INTERNAL FIXATION (ORIF) DISTAL RADIAL FRACTURE;  Surgeon: Elsie Mussel, MD;  Location: [facility] OR;  Service: Orthopedics;  Laterality: Right;  DVR Crosslock, MD available sometime around 1430-1500   ORIF ANKLE FRACTURE  Right 11/10/2019   Procedure: POSSIBLEOPEN REDUCTION INTERNAL FIXATION (ORIF) ANKLE FRACTURE;  Surgeon: Kendal Franky SQUIBB, MD;  Location: [facility] OR;  Service: Orthopedics;  Laterality: Right;   ORIF WRIST FRACTURE Left 11/10/2019   Procedure: OPEN REDUCTION INTERNAL FIXATION (ORIF) WRIST FRACTURE;  Surgeon: Kendal Franky SQUIBB, MD;  Location: [facility] OR;  Service: Orthopedics;  Laterality: Left;   TONSILLECTOMY     TOTAL KNEE ARTHROPLASTY Right 08/05/2016   Procedure: RIGHT TOTAL KNEE ARTHROPLASTY;  Surgeon: Vernetta Lonni GRADE, MD;  Location: [facility] OR;  Service: Orthopedics;   Laterality: Right;    OB History     Gravida  4   Para  2   Term  2   Preterm      AB  2   Living         SAB  2   IAB      Ectopic      Multiple      Live Births               Home Medications    Prior to Admission medications   Medication Sig Start Date End Date Taking? Authorizing Provider  acetaminophen  (TYLENOL ) 650 MG CR tablet Take 1,300 mg by mouth at bedtime.   Yes [provider]  atorvastatin  (LIPITOR) 40 MG tablet TAKE 1 TABLET EVERY DAY 12/21/23  Yes Duanne Butler DASEN, MD  b complex vitamins capsule Take 1 capsule by mouth daily.   Yes [provider]  Calcium  Carb-Cholecalciferol  (CALCIUM  600+D3 PO) Take 1 tablet by mouth in the morning and at bedtime.   Yes [provider]  cefadroxil  (DURICEF) 500 MG capsule Take 1 capsule (500 mg total) by mouth 2 (two) times daily. 02/03/24  Yes Maranda Jamee Jacob, MD  Cholecalciferol  (VITAMIN D -3) 25 MCG (1000 UT) CAPS Take 1,000 Units by mouth daily with breakfast.   Yes [provider]  cyclobenzaprine  (FLEXERIL ) 10 MG tablet Take 1 tablet (10 mg total) by mouth 3 (three) times daily as needed for muscle spasms. 09/29/23  Yes Duanne Butler DASEN, MD  docusate sodium  (COLACE) 100 MG capsule Take 1 capsule (100 mg total) by mouth 2 (two) times daily. 02/22/20  Yes Vann, Jessica U, DO  fluticasone  (FLONASE ) 50 MCG/ACT nasal spray Place 2 sprays into both nostrils at bedtime.   Yes [provider]  linaclotide  (LINZESS ) 290 MCG CAPS capsule Take 1 capsule (290 mcg total) by mouth daily 30 minutes before breakfast 01/08/22  Yes Lemmon, Delon Gibson, PA  loratadine  (CLARITIN ) 10 MG tablet Take 10 mg by mouth at bedtime.   Yes [provider]  Melatonin 10 MG TABS Take 10 mg by mouth at bedtime.   Yes [provider]  meloxicam  (MOBIC ) 15 MG tablet Take 1 tablet (15 mg total) by mouth daily. 09/28/23  Yes Duanne Butler DASEN, MD  Multiple Vitamins-Minerals  (ONE-A-DAY WOMENS PO) Take 1 tablet by mouth daily with breakfast.   Yes [provider]  nebivolol  (BYSTOLIC ) 2.5 MG tablet Take 1 tablet (2.5 mg total) by mouth daily. 09/28/23  Yes Duanne Butler DASEN, MD  Omega-3 Fatty Acids (FISH OIL ) 1000 MG CAPS Take 1,000 mg by mouth 2 (two) times daily.   Yes [provider]  pantoprazole  (PROTONIX ) 40 MG tablet TAKE 1 TABLET EVERY DAY 12/31/23  Yes Duanne Butler DASEN, MD  polyethylene glycol (MIRALAX  / GLYCOLAX ) 17 g packet Take 17 g by mouth daily.   Yes [provider]  venlafaxine  XR (EFFEXOR -XR) 150  MG 24 hr capsule TAKE 1 CAPSULE EVERY DAY WITH BREAKFAST 12/21/23  Yes Duanne Butler DASEN, MD  Wheat Dextrin (BENEFIBER) POWD Take 1 Dose by mouth daily. 2 teaspoons   Yes [provider]    Family History Family History  Problem Relation Age of Onset   Stroke Mother    Arthritis Mother    Thyroid  disease Mother    Cancer Father    Arthritis Sister    Thyroid  disease Sister    Cancer Maternal Grandmother    Stroke Maternal Grandfather    Cancer Maternal Grandfather    Colon cancer Maternal Grandfather    Cancer Paternal Grandmother    Heart disease Paternal Grandmother    Colon cancer Paternal Grandmother    Stroke Paternal Grandfather    Arthritis Sister    Obesity Sister    Sudden death Neg Hx    Hypertension Neg Hx    Hyperlipidemia Neg Hx    Heart attack Neg Hx    Diabetes Neg Hx    Rectal cancer Neg Hx    Stomach cancer Neg Hx    Esophageal cancer Neg Hx     Social History Social History   Tobacco Use   Smoking status: Former    Current packs/day: 0.00    Types: Cigarettes    Quit date: 10/27/1993    Years since quitting: 30.2   Smokeless tobacco: Never  Vaping Use   Vaping status: Never Used  Substance Use Topics   Alcohol use: Yes    Comment: one drink per week   Drug use: No     Allergies   Ciprofloxacin , Penicillins, and Sulfa antibiotics   Review of Systems Review of  Systems See HPI  Physical Exam Triage Vital Signs ED Triage Vitals  Encounter Vitals Group     BP 02/03/24 0958 124/80     Girls Systolic BP Percentile --      Girls Diastolic BP Percentile --      Boys Systolic BP Percentile --      Boys Diastolic BP Percentile --      Pulse Rate 02/03/24 0958 91     Resp 02/03/24 0958 18     Temp 02/03/24 0958 98.9 F (37.2 C)     Temp Source 02/03/24 0958 Oral     SpO2 02/03/24 0958 97 %     Weight 02/03/24 0957 265 lb (120.2 kg)     Height 02/03/24 0957 5' 10 (1.778 m)     Head Circumference --      Peak Flow --      Pain Score 02/03/24 0957 0     Pain Loc --      Pain Education --      Exclude from Growth Chart --    No data found.  Updated Vital Signs BP 124/80 (BP Location: Left Arm)   Pulse 91   Temp 98.9 F (37.2 C) (Oral)   Resp 18   Ht 5' 10 (1.778 m)   Wt 120.2 kg   SpO2 97%   BMI 38.02 kg/m     Physical Exam Constitutional:      General: She is not in acute distress.    Appearance: She is well-developed. She is obese.  HENT:     Head: Normocephalic and atraumatic.  Eyes:     Conjunctiva/sclera: Conjunctivae normal.     Pupils: Pupils are equal, round, and reactive to light.  Cardiovascular:     Rate and Rhythm: Normal rate.  Pulmonary:     Effort: Pulmonary effort is normal. No respiratory distress.  Musculoskeletal:        General: Normal range of motion.     Cervical back: Normal range of motion.       Feet:  Skin:    General: Skin is warm and dry.  Neurological:     Mental Status: She is alert.     Gait: Gait abnormal.      UC Treatments / Results  Labs (all labs ordered are listed, but only abnormal results are displayed) Labs Reviewed - No data to display  EKG   Radiology No results found.  Procedures Procedures (including critical care time)  Medications Ordered in UC Medications - No data to display  Initial Impression / Assessment and Plan / UC Course  I have reviewed the  triage vital signs and the nursing notes.  Pertinent labs & imaging results that were available during my care of the patient were reviewed by me and considered in my medical decision making (see chart for details).     Final Clinical Impressions(s) / UC Diagnoses   Final diagnoses:  Cellulitis of toe of left foot     Discharge Instructions      Take Duricef 2 times a day Warm soaks.  2 times a day.  Dry thoroughly See your doctor if not improving by next week   ED Prescriptions     Medication Sig Dispense Auth. Provider   cefadroxil  (DURICEF) 500 MG capsule Take 1 capsule (500 mg total) by mouth 2 (two) times daily. 14 capsule Maranda Jamee Jacob, MD      PDMP not reviewed this encounter.   Maranda Jamee Jacob, MD 02/03/24 1017

## 2024-02-03 NOTE — ED Triage Notes (Signed)
 Patient c/o left 5th toe itchy yesterday, feels like there's a blister on the bottom of her toe.  No injury.  Toe is somewhat swollen.  Denies any OTC pain meds.

## 2024-02-03 NOTE — Telephone Encounter (Signed)
 2nd attempt to reach patient. Left message with office. Documentation in patients chart she went to UC today. See notes

## 2024-02-03 NOTE — Telephone Encounter (Signed)
 Patient disconnected upon transfer. 1st attempt, left message with office callback number.  Copied from CRM #[id number]. Topic: Clinical - Red Word Triage >> Feb 03, 2024  8:41 AM Tobias CROME wrote: Red Word that prompted transfer to Nurse Triage: itchy toe, toe swelling and huge blister underneath the toe, redness & possibly infected

## 2024-02-08 ENCOUNTER — Ambulatory Visit (INDEPENDENT_AMBULATORY_CARE_PROVIDER_SITE_OTHER): Admitting: Family Medicine

## 2024-02-08 VITALS — BP 126/80 | HR 67 | Temp 98.0°F | Ht 70.0 in | Wt 275.0 lb

## 2024-02-08 DIAGNOSIS — L03032 Cellulitis of left toe: Secondary | ICD-10-CM

## 2024-02-08 DIAGNOSIS — B353 Tinea pedis: Secondary | ICD-10-CM

## 2024-02-08 MED ORDER — DOXYCYCLINE HYCLATE 100 MG PO TABS: 100.0000 mg | ORAL_TABLET | Freq: Two times a day (BID) | ORAL | 0 refills | Status: AC

## 2024-02-08 MED ORDER — TERBINAFINE HCL 250 MG PO TABS: 250.0000 mg | ORAL_TABLET | Freq: Every day | ORAL | 0 refills | Status: AC

## 2024-02-08 NOTE — Progress Notes (Signed)
 Subjective:    [name], female    DOB: 06-28-1955, 68 y.o.   MRN: [id number]  Patient was recently seen at a local urgent care and diagnosed with cellulitis of the toes of her left foot.  The left fourth toe is erythematous and slightly swollen.  The webspace between the 4th and 5th toes is macerated and white with an erythematous rash with a sharp serpiginous yellowed border.  She was given a cephalosporin but the rash has not improved.  She denies any pain in the toes.  She has normal sensation to 10 g monofilament. Past Medical History:  Diagnosis Date   Anxiety    Phreesia 06/04/2020   Arthritis    Phreesia 07/08/2020   Chronic idiopathic constipation 07/05/2013   Chronic pain    back and knee   Colitis 09/2012   infectious vs inflammatory.    Depression    GERD (gastroesophageal reflux disease)    Phreesia 06/04/2020   Hyperlipidemia    Insulin  resistance 10/15/2016   Ischemic stricture intestine    Lumbar radiculopathy 09/26/2022   Obesity    Osteoarthritis    Osteopenia    Spondylolisthesis of lumbar region 07/22/2022   Vitamin D  deficiency 04/08/2016   Past Surgical History:  Procedure Laterality Date   BIOPSY  02/21/2020   Procedure: BIOPSY;  Surgeon: Avram Lupita BRAVO, MD;  Location: [facility] ENDOSCOPY;  Service: Endoscopy;;   COLON RESECTION N/A 10/14/2013   Procedure: LAPAROSCOPIC MOBILIZATION OF SPLENIC FLEXURE, LAPAROSCOPIC EXTENDED LEFT COLECTOMY;  Surgeon: Camellia CHRISTELLA Blush, MD;  Location: [facility] OR;  Service: General;  Laterality: N/A;   COLON SURGERY N/A    Phreesia 06/04/2020   COLONOSCOPY N/A 10/12/2013   Procedure: COLONOSCOPY;  Surgeon: Gordy CHRISTELLA Starch, MD;  Location: [facility] ENDOSCOPY;  Service: Endoscopy;  Laterality: N/A;   COLONOSCOPY WITH PROPOFOL  N/A 02/21/2020   Procedure: COLONOSCOPY WITH PROPOFOL ;  Surgeon: Avram Lupita BRAVO, MD;  Location: [facility] ENDOSCOPY;  Service: Endoscopy;  Laterality: N/A;   DILATION AND CURETTAGE, DIAGNOSTIC / THERAPEUTIC  1987    OPEN REDUCTION INTERNAL FIXATION (ORIF) DISTAL RADIAL FRACTURE Right 08/08/2014   Procedure: OPEN REDUCTION INTERNAL FIXATION (ORIF) DISTAL RADIAL FRACTURE;  Surgeon: Elsie Mussel, MD;  Location: [facility] OR;  Service: Orthopedics;  Laterality: Right;  DVR Crosslock, MD available sometime around 1430-1500   ORIF ANKLE FRACTURE Right 11/10/2019   Procedure: POSSIBLEOPEN REDUCTION INTERNAL FIXATION (ORIF) ANKLE FRACTURE;  Surgeon: Kendal Franky SQUIBB, MD;  Location: [facility] OR;  Service: Orthopedics;  Laterality: Right;   ORIF WRIST FRACTURE Left 11/10/2019   Procedure: OPEN REDUCTION INTERNAL FIXATION (ORIF) WRIST FRACTURE;  Surgeon: Kendal Franky SQUIBB, MD;  Location: [facility] OR;  Service: Orthopedics;  Laterality: Left;   TONSILLECTOMY     TOTAL KNEE ARTHROPLASTY Right 08/05/2016   Procedure: RIGHT TOTAL KNEE ARTHROPLASTY;  Surgeon: Vernetta Lonni GRADE, MD;  Location: [facility] OR;  Service: Orthopedics;  Laterality: Right;   Current Outpatient Medications on File Prior to Visit  Medication Sig Dispense Refill   acetaminophen  (TYLENOL ) 650 MG CR tablet Take 1,300 mg by mouth at bedtime.     atorvastatin  (LIPITOR) 40 MG tablet TAKE 1 TABLET EVERY DAY 90 tablet 3   b complex vitamins capsule Take 1 capsule by mouth daily.     Calcium  Carb-Cholecalciferol  (CALCIUM  600+D3 PO) Take 1 tablet by mouth in the morning and at bedtime.     cefadroxil  (DURICEF) 500 MG capsule Take 1 capsule (500 mg total) by mouth 2 (two) times daily.  14 capsule 0   Cholecalciferol  (VITAMIN D -3) 25 MCG (1000 UT) CAPS Take 1,000 Units by mouth daily with breakfast.     cyclobenzaprine  (FLEXERIL ) 10 MG tablet Take 1 tablet (10 mg total) by mouth 3 (three) times daily as needed for muscle spasms. 270 tablet 3   docusate sodium  (COLACE) 100 MG capsule Take 1 capsule (100 mg total) by mouth 2 (two) times daily. 60 capsule 0   fluticasone  (FLONASE ) 50 MCG/ACT nasal spray Place 2 sprays into both nostrils at bedtime.     linaclotide  (LINZESS ) 290 MCG  CAPS capsule Take 1 capsule (290 mcg total) by mouth daily 30 minutes before breakfast 30 capsule 3   loratadine  (CLARITIN ) 10 MG tablet Take 10 mg by mouth at bedtime.     Melatonin 10 MG TABS Take 10 mg by mouth at bedtime.     meloxicam  (MOBIC ) 15 MG tablet Take 1 tablet (15 mg total) by mouth daily. 90 tablet 3   Multiple Vitamins-Minerals (ONE-A-DAY WOMENS PO) Take 1 tablet by mouth daily with breakfast.     nebivolol  (BYSTOLIC ) 2.5 MG tablet Take 1 tablet (2.5 mg total) by mouth daily. 90 tablet 1   Omega-3 Fatty Acids (FISH OIL ) 1000 MG CAPS Take 1,000 mg by mouth 2 (two) times daily.     pantoprazole  (PROTONIX ) 40 MG tablet TAKE 1 TABLET EVERY DAY 90 tablet 3   polyethylene glycol (MIRALAX  / GLYCOLAX ) 17 g packet Take 17 g by mouth daily.     venlafaxine  XR (EFFEXOR -XR) 150 MG 24 hr capsule TAKE 1 CAPSULE EVERY DAY WITH BREAKFAST 90 capsule 3   Wheat Dextrin (BENEFIBER) POWD Take 1 Dose by mouth daily. 2 teaspoons     No current facility-administered medications on file prior to visit.   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    Employer: [employer]    Comment: LPN  Tobacco Use   Smoking status: Former    Current packs/day: 0.00    Types: Cigarettes    Quit date: 10/27/1993    Years since quitting: 30.3   Smokeless tobacco: Never  Vaping Use   Vaping status: Never Used  Substance and Sexual Activity   Alcohol use: Yes    Comment: one drink per week   Drug use: No   Sexual activity: Not Currently  Other Topics Concern   Not on file  Social History Narrative   Not on file    Social Drivers of Health   Financial Resource Strain: Medium Risk (02/06/2024)   Overall Financial Resource Strain (CARDIA)    Difficulty of Paying Living Expenses: Somewhat hard  Food Insecurity: Food Insecurity Present (02/06/2024)   Hunger Vital Sign    Worried About Running Out of Food in the Last Year: Never true    Ran Out of Food in the Last Year: Sometimes true  Transportation Needs: No Transportation Needs (02/06/2024)   PRAPARE - Administrator, Civil Service (Medical): No    Lack of Transportation (Non-Medical): No  Physical Activity: Sufficiently Active (02/06/2024)   Exercise Vital Sign  Days of Exercise per Week: 3 days    Minutes of Exercise per Session: 60 min  Stress: No Stress Concern Present (02/06/2024)   [employer] of Occupational Health - Occupational Stress Questionnaire    Feeling of Stress: Not at all  Social Connections: Socially Isolated (02/06/2024)   Social Connection and Isolation Panel    Frequency of Communication with Friends and Family: More than three times a week    Frequency of Social Gatherings with Friends and Family: More than three times a week    Attends Religious Services: Never    Database Administrator or Organizations: No    Attends Engineer, Structural: Not on file    Marital Status: Divorced  Intimate Partner Violence: Not At Risk (07/23/2023)   Humiliation, Afraid, Rape, and Kick questionnaire    Fear of Current or Ex-Partner: No    Emotionally Abused: No    Physically Abused: No    Sexually Abused: No   Family History  Problem Relation Age of Onset   Stroke Mother    Arthritis Mother    Thyroid  disease Mother    Cancer Father    Arthritis Sister    Thyroid  disease Sister    Cancer Maternal Grandmother    Stroke Maternal Grandfather    Cancer Maternal Grandfather    Colon cancer Maternal Grandfather    Cancer Paternal Grandmother    Heart disease Paternal Grandmother    Colon cancer  Paternal Grandmother    Stroke Paternal Grandfather    Arthritis Sister    Obesity Sister    Sudden death Neg Hx    Hypertension Neg Hx    Hyperlipidemia Neg Hx    Heart attack Neg Hx    Diabetes Neg Hx    Rectal cancer Neg Hx    Stomach cancer Neg Hx    Esophageal cancer Neg Hx       Review of Systems  All other systems reviewed and are negative.      Objective:   Physical Exam Vitals reviewed.  Constitutional:      General: She is not in acute distress.    Appearance: She is obese. She is not ill-appearing, toxic-appearing or diaphoretic.  HENT:     Head: Normocephalic and atraumatic.  Neck:     Vascular: No carotid bruit.  Cardiovascular:     Rate and Rhythm: Normal rate and regular rhythm.     Pulses: Normal pulses.     Heart sounds: Normal heart sounds. No murmur heard.    No friction rub. No gallop.  Pulmonary:     Effort: Pulmonary effort is normal. No respiratory distress.     Breath sounds: Normal breath sounds. No stridor. No wheezing, rhonchi or rales.  Chest:     Chest wall: No tenderness.  Abdominal:     Palpations: Abdomen is soft.     Tenderness: There is no abdominal tenderness.  Musculoskeletal:     Cervical back: No muscular tenderness.     Right lower leg: No edema.     Left lower leg: No edema.       Feet:  Skin:    Findings: Erythema and rash present.  Neurological:     General: No focal deficit present.     Mental Status: She is alert and oriented to person, place, and time. Mental status is at baseline.     Cranial Nerves: No cranial nerve deficit.  Psychiatric:        Mood and  Affect: Mood normal.        Behavior: Behavior normal.        Thought Content: Thought content normal.        Judgment: Judgment normal.           Assessment & Plan:  Cellulitis of toe of left foot  Tinea pedis of left foot Given the macerated white skin in between the 4th and 5th toes and the sharp erythematous serpiginous border, I feel that this  is more likely tinea pedis.  Patient does not want to use topical cream.  Therefore I gave her Lamisil 250 mg daily for 2 weeks although I recommended she use Lotrimin cream twice daily until better.  I will give her doxycycline to cover MRSA since the fourth toe is slightly erythematous however it is not warm or tender to the touch.  I anticipate that the erythema will improve as the rash improves.  Recheck in 1 week or sooner if worsening

## 2024-02-16 ENCOUNTER — Encounter: Payer: Self-pay | Admitting: Family Medicine

## 2024-02-26 ENCOUNTER — Encounter: Payer: Self-pay | Admitting: Family Medicine

## 2024-03-07 ENCOUNTER — Ambulatory Visit: Admitting: Family Medicine

## 2024-03-07 ENCOUNTER — Encounter: Payer: Self-pay | Admitting: Family Medicine

## 2024-03-07 VITALS — BP 130/74 | HR 65 | Temp 98.0°F | Ht 70.0 in | Wt 274.0 lb

## 2024-03-07 DIAGNOSIS — T691XXA Chilblains, initial encounter: Secondary | ICD-10-CM | POA: Diagnosis not present

## 2024-03-07 MED ORDER — TRIAMCINOLONE ACETONIDE 0.1 % EX CREA: 1.0000 | TOPICAL_CREAM | Freq: Two times a day (BID) | CUTANEOUS | 0 refills | Status: AC

## 2024-03-07 NOTE — Progress Notes (Signed)
 "  Subjective:    [name], female    DOB: 06-Dec-1955, 68 y.o.   MRN: [id number]   Patient was recently treated for cellulitis in the left foot at a local urgent care however when I saw the patient I felt that she most likely had tinea pedis in the left foot which we treated with a combination of oral Lamisil  and topical Lotrimin.  The erythema and the macerated skin in the webspace between the 4th and 5th toes of the left foot has completely healed.  While we were treating this I recommended that the patient leave her feet exposed to the air to facilitate drying and to prevent sweat and moisture from accumulating.  She had been wearing thick socks and I felt that sweat and moisture was contributing to the fungal infection on her left foot.  During treatment for the infection on her left foot, the patient has dutifully left her toes open to help keep her toes dry.  She has been wearing socks that are open toed as shown above and wearing flip-flops.  It is extremely cold now.  She has now developed erythematous painful macules and nodules on the toes of the right foot as shown above despite taking doxycycline , Lamisil , and she has even been applying Lotrimin cream to these affected toes.  She has palpable 2/4 dorsalis pedis and posterior tibialis pulses in both feet.  There is no murmur to suggest endocarditis.  There is no similar rash on her fingers. Past Medical History:  Diagnosis Date   Anxiety    Phreesia 06/04/2020   Arthritis    Phreesia 07/08/2020   Chronic idiopathic constipation 07/05/2013   Chronic pain    back and knee   Colitis 09/2012   infectious vs inflammatory.    Depression    GERD (gastroesophageal reflux disease)    Phreesia 06/04/2020   Hyperlipidemia    Insulin  resistance 10/15/2016   Ischemic stricture intestine    Lumbar radiculopathy 09/26/2022   Obesity    Osteoarthritis    Osteopenia    Spondylolisthesis of lumbar region 07/22/2022   Vitamin D   deficiency 04/08/2016   Past Surgical History:  Procedure Laterality Date   BIOPSY  02/21/2020   Procedure: BIOPSY;  Surgeon: Avram Lupita BRAVO, MD;  Location: [facility] ENDOSCOPY;  Service: Endoscopy;;   COLON RESECTION N/A 10/14/2013   Procedure: LAPAROSCOPIC MOBILIZATION OF SPLENIC FLEXURE, LAPAROSCOPIC EXTENDED LEFT COLECTOMY;  Surgeon: Camellia CHRISTELLA Blush, MD;  Location: [facility] OR;  Service: General;  Laterality: N/A;   COLON SURGERY N/A    Phreesia 06/04/2020   COLONOSCOPY N/A 10/12/2013   Procedure: COLONOSCOPY;  Surgeon: Gordy CHRISTELLA Starch, MD;  Location: [facility] ENDOSCOPY;  Service: Endoscopy;  Laterality: N/A;   COLONOSCOPY WITH PROPOFOL  N/A 02/21/2020   Procedure: COLONOSCOPY WITH PROPOFOL ;  Surgeon: Avram Lupita BRAVO, MD;  Location: [facility] ENDOSCOPY;  Service: Endoscopy;  Laterality: N/A;   DILATION AND CURETTAGE, DIAGNOSTIC / THERAPEUTIC  1987   OPEN REDUCTION INTERNAL FIXATION (ORIF) DISTAL RADIAL FRACTURE Right 08/08/2014   Procedure: OPEN REDUCTION INTERNAL FIXATION (ORIF) DISTAL RADIAL FRACTURE;  Surgeon: Elsie Mussel, MD;  Location: [facility] OR;  Service: Orthopedics;  Laterality: Right;  DVR Crosslock, MD available sometime around 1430-1500   ORIF ANKLE FRACTURE Right 11/10/2019   Procedure: POSSIBLEOPEN REDUCTION INTERNAL FIXATION (ORIF) ANKLE FRACTURE;  Surgeon: Kendal Franky SQUIBB, MD;  Location: [facility] OR;  Service: Orthopedics;  Laterality: Right;   ORIF WRIST FRACTURE Left 11/10/2019   Procedure: OPEN REDUCTION INTERNAL  FIXATION (ORIF) WRIST FRACTURE;  Surgeon: Kendal Franky SQUIBB, MD;  Location: [facility] OR;  Service: Orthopedics;  Laterality: Left;   TONSILLECTOMY     TOTAL KNEE ARTHROPLASTY Right 08/05/2016   Procedure: RIGHT TOTAL KNEE ARTHROPLASTY;  Surgeon: Vernetta Lonni GRADE, MD;  Location: [facility] OR;  Service: Orthopedics;  Laterality: Right;   Current Outpatient Medications on File Prior to Visit  Medication Sig Dispense Refill   acetaminophen  (TYLENOL ) 650 MG CR tablet Take 1,300 mg by mouth at bedtime.      atorvastatin  (LIPITOR) 40 MG tablet TAKE 1 TABLET EVERY DAY 90 tablet 3   b complex vitamins capsule Take 1 capsule by mouth daily.     Calcium  Carb-Cholecalciferol  (CALCIUM  600+D3 PO) Take 1 tablet by mouth in the morning and at bedtime.     cefadroxil  (DURICEF) 500 MG capsule Take 1 capsule (500 mg total) by mouth 2 (two) times daily. 14 capsule 0   Cholecalciferol  (VITAMIN D -3) 25 MCG (1000 UT) CAPS Take 1,000 Units by mouth daily with breakfast.     cyclobenzaprine  (FLEXERIL ) 10 MG tablet Take 1 tablet (10 mg total) by mouth 3 (three) times daily as needed for muscle spasms. 270 tablet 3   docusate sodium  (COLACE) 100 MG capsule Take 1 capsule (100 mg total) by mouth 2 (two) times daily. 60 capsule 0   doxycycline  (VIBRA -TABS) 100 MG tablet Take 1 tablet (100 mg total) by mouth 2 (two) times daily. 14 tablet 0   fluticasone  (FLONASE ) 50 MCG/ACT nasal spray Place 2 sprays into both nostrils at bedtime.     linaclotide  (LINZESS ) 290 MCG CAPS capsule Take 1 capsule (290 mcg total) by mouth daily 30 minutes before breakfast 30 capsule 3   loratadine  (CLARITIN ) 10 MG tablet Take 10 mg by mouth at bedtime.     Melatonin 10 MG TABS Take 10 mg by mouth at bedtime.     meloxicam  (MOBIC ) 15 MG tablet Take 1 tablet (15 mg total) by mouth daily. 90 tablet 3   Multiple Vitamins-Minerals (ONE-A-DAY WOMENS PO) Take 1 tablet by mouth daily with breakfast.     nebivolol  (BYSTOLIC ) 2.5 MG tablet Take 1 tablet (2.5 mg total) by mouth daily. 90 tablet 1   Omega-3 Fatty Acids (FISH OIL ) 1000 MG CAPS Take 1,000 mg by mouth 2 (two) times daily.     pantoprazole  (PROTONIX ) 40 MG tablet TAKE 1 TABLET EVERY DAY 90 tablet 3   polyethylene glycol (MIRALAX  / GLYCOLAX ) 17 g packet Take 17 g by mouth daily.     venlafaxine  XR (EFFEXOR -XR) 150 MG 24 hr capsule TAKE 1 CAPSULE EVERY DAY WITH BREAKFAST 90 capsule 3   Wheat Dextrin (BENEFIBER) POWD Take 1 Dose by mouth daily. 2 teaspoons     terbinafine  (LAMISIL ) 250 MG tablet  Take 1 tablet (250 mg total) by mouth daily. (Patient not taking: Reported on 03/07/2024) 14 tablet 0   No current facility-administered medications on file prior to visit.   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    Employer: [employer]    Comment: LPN  Tobacco Use  Smoking status: Former    Current packs/day: 0.00    Types: Cigarettes    Quit date: 10/27/1993    Years since quitting: 30.3   Smokeless tobacco: Never  Vaping Use   Vaping status: Never Used  Substance and Sexual Activity   Alcohol use: Yes    Comment: one drink per week   Drug use: No   Sexual activity: Not Currently  Other Topics Concern   Not on file  Social History Narrative   Not on file   Social Drivers of Health   Tobacco Use: Medium Risk (03/07/2024)   Patient History    Smoking Tobacco Use: Former    Smokeless Tobacco Use: Never    Passive Exposure: Not on file  Financial Resource Strain: Medium Risk (02/06/2024)   Overall Financial Resource Strain (CARDIA)    Difficulty of Paying Living Expenses: Somewhat hard  Food Insecurity: Food Insecurity Present (02/06/2024)   Epic    Worried About Programme Researcher, Broadcasting/film/video in the Last Year: Never true    Ran Out of Food in the Last Year: Sometimes true  Transportation Needs: No Transportation Needs (02/06/2024)   Epic    Lack of Transportation (Medical): No    Lack of Transportation (Non-Medical): No  Physical Activity: Sufficiently Active (02/06/2024)   Exercise Vital Sign    Days of Exercise per Week: 3 days    Minutes of Exercise per Session: 60 min   Stress: No Stress Concern Present (02/06/2024)   [employer] of Occupational Health - Occupational Stress Questionnaire    Feeling of Stress: Not at all  Social Connections: Socially Isolated (02/06/2024)   Social Connection and Isolation Panel    Frequency of Communication with Friends and Family: More than three times a week    Frequency of Social Gatherings with Friends and Family: More than three times a week    Attends Religious Services: Never    Database Administrator or Organizations: No    Attends Engineer, Structural: Not on file    Marital Status: Divorced  Intimate Partner Violence: Not At Risk (07/23/2023)   Humiliation, Afraid, Rape, and Kick questionnaire    Fear of Current or Ex-Partner: No    Emotionally Abused: No    Physically Abused: No    Sexually Abused: No  Depression (PHQ2-9): Low Risk (02/08/2024)   Depression (PHQ2-9)    PHQ-2 Score: 0  Alcohol Screen: Low Risk (02/06/2024)   Alcohol Screen    Last Alcohol Screening Score (AUDIT): 2  Housing: Low Risk (02/06/2024)   Epic    Unable to Pay for Housing in the Last Year: No    Number of Times Moved in the Last Year: 0    Homeless in the Last Year: No  Utilities: Not At Risk (07/23/2023)   [facility] Utilities    Threatened with loss of utilities: No  Health Literacy: Adequate Health Literacy (07/23/2023)   B1300 Health Literacy    Frequency of need for help with medical instructions: Never   Family History  Problem Relation Age of Onset   Stroke Mother    Arthritis Mother    Thyroid  disease Mother    Cancer Father    Arthritis Sister    Thyroid  disease Sister    Cancer Maternal Grandmother    Stroke Maternal Grandfather    Cancer Maternal Grandfather    Colon cancer Maternal Grandfather    Cancer Paternal Grandmother    Heart disease Paternal Grandmother    Colon  cancer Paternal Grandmother    Stroke Paternal Grandfather    Arthritis Sister    Obesity Sister    Sudden death Neg Hx     Hypertension Neg Hx    Hyperlipidemia Neg Hx    Heart attack Neg Hx    Diabetes Neg Hx    Rectal cancer Neg Hx    Stomach cancer Neg Hx    Esophageal cancer Neg Hx       Review of Systems  All other systems reviewed and are negative.      Objective:   Physical Exam Vitals reviewed.  Constitutional:      General: She is not in acute distress.    Appearance: She is obese. She is not ill-appearing, toxic-appearing or diaphoretic.  HENT:     Head: Normocephalic and atraumatic.  Neck:     Vascular: No carotid bruit.  Cardiovascular:     Rate and Rhythm: Normal rate and regular rhythm.     Pulses: Normal pulses.     Heart sounds: Normal heart sounds. No murmur heard.    No friction rub. No gallop.  Pulmonary:     Effort: Pulmonary effort is normal. No respiratory distress.     Breath sounds: Normal breath sounds. No stridor. No wheezing, rhonchi or rales.  Chest:     Chest wall: No tenderness.  Abdominal:     Palpations: Abdomen is soft.     Tenderness: There is no abdominal tenderness.  Musculoskeletal:     Cervical back: No muscular tenderness.     Right lower leg: No edema.     Left lower leg: No edema.  Skin:    Findings: Erythema and rash present.  Neurological:     General: No focal deficit present.     Mental Status: She is alert and oriented to person, place, and time. Mental status is at baseline.     Cranial Nerves: No cranial nerve deficit.  Psychiatric:        Mood and Affect: Mood normal.        Behavior: Behavior normal.        Thought Content: Thought content normal.        Judgment: Judgment normal.           Assessment & Plan:  Chilblains, initial encounter I believe the patient may be dealing with chilblains/pernio.  I believe that by leaving her toes open to the air trying to keep the skin dry, she has been exposing the toes to cold air.  I believe that this is causing vasospasms in the microscopic blood vessels in the skin of her toes  leading to the painful erythematous papules and macules seen above.  I believe this is also explaining the tenderness and pain that there experiencing.  I believe this is also why the rash has gotten worse during treatment.  Also on the differential diagnosis would be cryoglobulinemia but there is no other rash anywhere on her legs or feet.  I also do not believe that this is due to any thromboembolic event from the arteries in her legs.  Therefore I recommended that she keep the toes warm by wearing thick socks and using foot warmers if possible.  She can use ibuprofen for the discomfort and she can apply triamcinolone  cream to the affected rash.  Recheck if not improving over the next week. "

## 2024-03-14 ENCOUNTER — Ambulatory Visit: Admitting: Family Medicine

## 2024-03-14 ENCOUNTER — Other Ambulatory Visit: Payer: Self-pay | Admitting: Family Medicine
